# Patient Record
Sex: Female | Born: 1935 | Race: Black or African American | Hispanic: No | Smoking: Never smoker
Health system: Southern US, Community
[De-identification: ages and names within clinical notes are randomized; demographics above are authoritative.]

## PROBLEM LIST (undated history)

## (undated) DIAGNOSIS — D649 Anemia, unspecified: Secondary | ICD-10-CM

## (undated) DIAGNOSIS — R928 Other abnormal and inconclusive findings on diagnostic imaging of breast: Secondary | ICD-10-CM

## (undated) DIAGNOSIS — G629 Polyneuropathy, unspecified: Secondary | ICD-10-CM

## (undated) DIAGNOSIS — N959 Unspecified menopausal and perimenopausal disorder: Secondary | ICD-10-CM

## (undated) DIAGNOSIS — R1310 Dysphagia, unspecified: Secondary | ICD-10-CM

## (undated) DIAGNOSIS — M792 Neuralgia and neuritis, unspecified: Secondary | ICD-10-CM

## (undated) DIAGNOSIS — N760 Acute vaginitis: Secondary | ICD-10-CM

## (undated) DIAGNOSIS — R300 Dysuria: Secondary | ICD-10-CM

## (undated) DIAGNOSIS — Z1231 Encounter for screening mammogram for malignant neoplasm of breast: Secondary | ICD-10-CM

## (undated) DIAGNOSIS — M542 Cervicalgia: Secondary | ICD-10-CM

## (undated) DIAGNOSIS — R9431 Abnormal electrocardiogram [ECG] [EKG]: Secondary | ICD-10-CM

## (undated) DIAGNOSIS — G8929 Other chronic pain: Secondary | ICD-10-CM

## (undated) DIAGNOSIS — N390 Urinary tract infection, site not specified: Secondary | ICD-10-CM

## (undated) DIAGNOSIS — I8390 Asymptomatic varicose veins of unspecified lower extremity: Secondary | ICD-10-CM

## (undated) DIAGNOSIS — Z853 Personal history of malignant neoplasm of breast: Secondary | ICD-10-CM

## (undated) DIAGNOSIS — Z1239 Encounter for other screening for malignant neoplasm of breast: Secondary | ICD-10-CM

## (undated) DIAGNOSIS — C50412 Malignant neoplasm of upper-outer quadrant of left female breast: Secondary | ICD-10-CM

## (undated) DIAGNOSIS — N6321 Unspecified lump in the left breast, upper outer quadrant: Secondary | ICD-10-CM

## (undated) DIAGNOSIS — K219 Gastro-esophageal reflux disease without esophagitis: Secondary | ICD-10-CM

## (undated) DIAGNOSIS — N6320 Unspecified lump in the left breast, unspecified quadrant: Secondary | ICD-10-CM

## (undated) DIAGNOSIS — B9689 Other specified bacterial agents as the cause of diseases classified elsewhere: Secondary | ICD-10-CM

## (undated) DIAGNOSIS — R634 Abnormal weight loss: Secondary | ICD-10-CM

## (undated) DIAGNOSIS — M5410 Radiculopathy, site unspecified: Secondary | ICD-10-CM

## (undated) DIAGNOSIS — E875 Hyperkalemia: Secondary | ICD-10-CM

## (undated) DIAGNOSIS — N63 Unspecified lump in unspecified breast: Secondary | ICD-10-CM

## (undated) DIAGNOSIS — M810 Age-related osteoporosis without current pathological fracture: Secondary | ICD-10-CM

## (undated) DIAGNOSIS — F0280 Dementia in other diseases classified elsewhere without behavioral disturbance: Secondary | ICD-10-CM

## (undated) DIAGNOSIS — G301 Alzheimer's disease with late onset: Secondary | ICD-10-CM

## (undated) DIAGNOSIS — G609 Hereditary and idiopathic neuropathy, unspecified: Secondary | ICD-10-CM

## (undated) DIAGNOSIS — R519 Headache, unspecified: Secondary | ICD-10-CM

## (undated) DIAGNOSIS — J301 Allergic rhinitis due to pollen: Secondary | ICD-10-CM

## (undated) DIAGNOSIS — M4727 Other spondylosis with radiculopathy, lumbosacral region: Secondary | ICD-10-CM

## (undated) DIAGNOSIS — G2581 Restless legs syndrome: Principal | ICD-10-CM

## (undated) DIAGNOSIS — R062 Wheezing: Principal | ICD-10-CM

## (undated) DIAGNOSIS — I10 Essential (primary) hypertension: Secondary | ICD-10-CM

## (undated) DIAGNOSIS — Z171 Estrogen receptor negative status [ER-]: Secondary | ICD-10-CM

## (undated) DIAGNOSIS — F01A Vascular dementia, mild, without behavioral disturbance, psychotic disturbance, mood disturbance, and anxiety (HCC): Principal | ICD-10-CM

## (undated) DIAGNOSIS — M4722 Other spondylosis with radiculopathy, cervical region: Principal | ICD-10-CM

## (undated) DIAGNOSIS — R413 Other amnesia: Secondary | ICD-10-CM

## (undated) DIAGNOSIS — Z0000 Encounter for general adult medical examination without abnormal findings: Secondary | ICD-10-CM

## (undated) DIAGNOSIS — J9601 Acute respiratory failure with hypoxia: Principal | ICD-10-CM

## (undated) DIAGNOSIS — E7800 Pure hypercholesterolemia, unspecified: Secondary | ICD-10-CM

## (undated) DIAGNOSIS — I7090 Unspecified atherosclerosis: Secondary | ICD-10-CM

## (undated) DIAGNOSIS — M1990 Unspecified osteoarthritis, unspecified site: Secondary | ICD-10-CM

## (undated) DIAGNOSIS — I208 Other forms of angina pectoris: Secondary | ICD-10-CM

---

## 2007-08-20 LAB — VITAMIN D, 1, 25 DIHYDROXY: Vitamin D,1,25-dihydroxy: 52 pg/mL (ref 15–75)

## 2007-10-17 LAB — T4 (THYROXINE): T4, Total: 6.3 ug/dL (ref 5–12)

## 2007-10-17 LAB — T3 TOTAL: T3, total: 131 NG/DL (ref 80–181)

## 2007-10-17 LAB — PROLACTIN: Prolactin: 40.3 NG/ML — ABNORMAL HIGH (ref 1–25)

## 2007-10-17 LAB — TSH 3RD GENERATION: TSH: 0.34 u[IU]/mL — ABNORMAL LOW (ref 0.35–5.5)

## 2007-10-22 LAB — PTH INTACT
Calcium: 9.8 MG/DL (ref 8.5–10.1)
PTH, Intact: 42.3 pg/mL (ref 14–72)

## 2007-11-18 LAB — METABOLIC PANEL, COMPREHENSIVE
A-G Ratio: 1 — ABNORMAL LOW (ref 1.1–2.2)
ALT (SGPT): 34 U/L (ref 30–65)
AST (SGOT): 19 U/L (ref 15–37)
Albumin: 3.6 g/dL (ref 3.5–5.0)
Alk. phosphatase: 78 U/L (ref 50–136)
Anion gap: 6 mmol/L (ref 5–15)
BUN/Creatinine ratio: 9 — ABNORMAL LOW (ref 12–20)
BUN: 10 MG/DL (ref 6–20)
Bilirubin, total: 0.3 MG/DL (ref <1.0)
CO2: 29 MMOL/L (ref 21–32)
Calcium: 10 MG/DL (ref 8.5–10.1)
Chloride: 103 MMOL/L (ref 97–108)
Creatinine: 1.1 MG/DL (ref 0.6–1.3)
GFR est AA: >60 mL/min/{1.73_m2} (ref >60)
GFR est non-AA: 51 mL/min/{1.73_m2} — ABNORMAL LOW (ref >60)
Globulin: 3.7 g/dL (ref 2.0–4.0)
Glucose: 83 MG/DL (ref 65–105)
Potassium: 3.6 MMOL/L (ref 3.5–5.1)
Protein, total: 7.3 g/dL (ref 6.4–8.2)
Sodium: 138 MMOL/L (ref 136–145)

## 2007-11-18 LAB — LIPASE: Lipase: 194 U/L (ref 114–286)

## 2007-11-18 LAB — AMYLASE: Amylase: 129 U/L — ABNORMAL HIGH (ref 25–115)

## 2007-11-21 LAB — CREATININE, POC
Creatinine (POC): 1.2 MG/DL (ref 0.6–1.3)
GFRAA, POC: 52 mL/min/{1.73_m2} — ABNORMAL LOW (ref >60)
GFRNA, POC: 43 mL/min/{1.73_m2} — ABNORMAL LOW (ref >60)

## 2008-02-09 NOTE — Op Note (Signed)
Name: Kimberly [name], Kimberly [name]  MR #: [id number] Surgeon: Levonne Spiller,   M.D.  Account #: [id number] Surgery Date:  DOB: June 11, 1924  Age: 74 Location:     OPERATIVE REPORT      PREOPERATIVE DIAGNOSIS: An 74 year old female with dysplastic anemia.    POSTOPERATIVE DIAGNOSES:  1. Hiatal hernia, 3 cm.  2. Mild gastritis.  3. Smooth duodenum, rule out celiac disease.    INDICATIONS: An 74 year old female presenting with dysphagia, heartburn,  history of anemia. History and Physical was done prior to examination.    ANESTHESIA RISK: 3.    AIRWAY ASSESSMENT: 2.    MEDICATIONS: Fentanyl and Versed. See my notes for dosages.    MONITORING: Conscious anesthesia was monitored including O2 saturation,  blood pressure, airway, and EKG monitoring, all remained stable during the  examination.    ANESTHESIA PRE-CHECK AND PLAN: No change. Time-out occurred.    CONSENT: Complications of the procedure were discussed including  perforation and need for surgery.    DESCRIPTION OF PROCEDURE: With the patient in the left lateral position,  the Olympus video endoscope was passed difficulty from the posterior  pharynx to the patient's esophagus. Posterior pharynx was unremarkable.  There was no evidence of any esophageal obstruction noted. There was a  hiatal hernia of 3 cm. No significant esophagitis was noted. The  instrument was passed into the stomach. There was diffuse mild gastritis  noted with AVM noted x1. The instrument was passed through the pylorus  into the duodenum. Duodenum was unremarkable in the bulb and second  portion appeared slightly smooth. In the second and third portion of the  duodenum, biopsies were taken to exclude sprue. The instrument was  withdrawn back into the stomach and retroflexed. No additional findings in  the fundus or cardia, biopsy was taken for Helicobacter pylori testing.  Instrument was withdrawn. The patient tolerated the entire procedure  well.     PLAN: Await results of the biopsy report. Will proceed with colonoscopy  examination after discussing with Dr. Rogelia Boga.            E-Signed By  Levonne Spiller, M.D. 02/16/2008 10:24  Levonne Spiller, M.D.    cc: Levonne Spiller, M.D.        HJS/FI2; D: 02/09/2008 10:28 A; T: 02/09/2008 2:29 P; Doc# [id number]; Job#  [id number]

## 2008-02-09 NOTE — Op Note (Signed)
Op Notes signed by  at 02/16/08 1024                  Author: Charolette Forward, MD  Service: --  Author Type: Physician            Filed:   Date of Service: 02/09/08 1429  Status: Signed          <!--EPICS--> Name:      Kimberly [name], Kimberly [name]<BR> MR #:      [id number]                    Surgeon:        Levonne Spiller, <BR> M.D.<BR> Account #: [id number]                  Surgery Date:<BR> DOB:       07-08-1924<BR> Age:       74                           Location:<BR> <BR>                              OPERATIVE REPORT<BR> <BR> <BR> PREOPERATIVE DIAGNOSIS:  An 74 year old female with dysplastic anemia.<BR>  <BR> POSTOPERATIVE DIAGNOSES:<BR> 1.     Hiatal hernia, 3 cm.<BR> 2.     Mild gastritis.<BR> 3.     Smooth duodenum, rule out celiac disease.<BR> <BR> INDICATIONS: An 74 year old female presenting with dysphagia, heartburn,<BR> history of anemia.  History  and Physical was done prior to examination.<BR> <BR> ANESTHESIA RISK:  3.<BR> <BR> AIRWAY ASSESSMENT:  2.<BR> <BR> MEDICATIONS:   Fentanyl and Versed.  See my notes for dosages.<BR> <BR> MONITORING:  Conscious anesthesia was monitored including O2 saturation,<BR>  blood pressure, airway, and EKG monitoring, all remained stable during the<BR> examination.<BR> <BR> ANESTHESIA PRE-CHECK AND PLAN:  No change.  Time-out occurred.<BR> <BR> CONSENT:  Complications of the procedure were discussed including<BR> perforation  and need for surgery.<BR> <BR> DESCRIPTION OF PROCEDURE:  With the patient in the left lateral position,<BR> the Olympus video endoscope was passed difficulty from the posterior<BR> pharynx to the patient's esophagus.  Posterior pharynx was unremarkable.<BR>  There was no evidence of any esophageal obstruction noted.  There was a<BR> hiatal hernia of 3 cm.  No significant esophagitis was noted.  The<BR> instrument was passed into the stomach.  There was diffuse mild gastritis<BR> noted with AVM noted x1.   The instrument was passed through the  pylorus<BR> into the duodenum.  Duodenum was unremarkable in the bulb and second<BR> portion appeared slightly smooth.  In the second and third portion of the<BR> duodenum, biopsies were taken to exclude sprue.  The  instrument was<BR> withdrawn back into the stomach and retroflexed.  No additional findings in<BR> the fundus or cardia, biopsy was taken for Helicobacter pylori testing.<BR> Instrument was withdrawn.  The patient tolerated the entire procedure<BR> well.<BR>  <BR> PLAN:  Await results of the biopsy report.  Will proceed with colonoscopy<BR> examination after discussing with Dr. Rogelia Boga.<BR> <BR> <BR> <BR> <BR> <BR> E-Signed By<BR> Levonne Spiller, M.D. 02/16/2008 10:24<BR> Levonne Spiller, M.D.<BR>  <BR> cc:   Levonne Spiller, M.D.<BR> <BR> <BR> <BR> HJS/FI2; D: 02/09/2008 10:28 A; T: 02/09/2008  2:29 P; Doc# [id number]; Job#<BR> [id number]<BM> <!--EPICE-->

## 2009-04-12 LAB — CREATININE, POC
Creatinine (POC): 0.9 MG/DL (ref 0.6–1.3)
GFRAA, POC: >60 mL/min/{1.73_m2} (ref >60)
GFRNA, POC: >60 mL/min/{1.73_m2} (ref >60)

## 2009-04-12 MED ADMIN — ioversol (OPTIRAY) 350 mg/mL contrast solution 100 mL: INTRAVENOUS | @ 16:00:00 | NDC 00019133311

## 2009-04-12 MED ADMIN — sodium chloride (NS) flush 10 mL: INTRAVENOUS | @ 16:00:00 | NDC 58016499501

## 2009-04-12 MED ADMIN — 0.9% sodium chloride infusion: INTRAVENOUS | @ 16:00:00 | NDC 00409798309

## 2009-04-12 MED FILL — SALINE FLUSH INJECTION SYRINGE: INTRAMUSCULAR | Qty: 10

## 2009-04-12 MED FILL — SODIUM CHLORIDE 0.9 % IV: INTRAVENOUS | Qty: 1000

## 2009-04-12 MED FILL — OPTIRAY 350 MG IODINE/ML INTRAVENOUS SOLUTION: 350 mg iodine/mL | INTRAVENOUS | Qty: 100

## 2009-07-20 LAB — CREATININE, POC
Creatinine (POC): 0.8 MG/DL (ref 0.6–1.3)
GFRAA, POC: >60 mL/min/{1.73_m2} (ref >60)
GFRNA, POC: >60 mL/min/{1.73_m2} (ref >60)

## 2009-07-20 MED ADMIN — ioversol (OPTIRAY) 320 mg/mL contrast injection 80 mL: INTRAVENOUS | @ 19:00:00 | NDC 00019132311

## 2009-07-20 MED ADMIN — sodium chloride (NS) flush 10 mL: INTRAVENOUS | @ 19:00:00 | NDC 58016499501

## 2009-07-20 MED ADMIN — 0.9% sodium chloride infusion: INTRAVENOUS | @ 19:00:00 | NDC 00409798348

## 2009-07-20 MED FILL — SODIUM CHLORIDE 0.9 % IV: INTRAVENOUS | Qty: 1000

## 2009-07-20 MED FILL — OPTIRAY 320 MG IODINE/ML INTRAVENOUS SOLUTION: 320 mg iodine/mL | INTRAVENOUS | Qty: 100

## 2009-07-20 MED FILL — SALINE FLUSH INJECTION SYRINGE: INTRAMUSCULAR | Qty: 10

## 2010-05-12 LAB — CREATININE, POC
Creatinine (POC): 0.8 MG/DL (ref 0.6–1.3)
GFRAA, POC: >60 mL/min/{1.73_m2} (ref >60)
GFRNA, POC: >60 mL/min/{1.73_m2} (ref >60)

## 2010-05-12 MED ORDER — BARIUM SULFATE 2 % ORAL SUSP
2.1 % (w/v), 2.0 % (w/w) | Freq: Once | ORAL | Status: AC
Start: 2010-05-12 — End: 2010-05-12
  Administered 2010-05-12: 13:00:00 via ORAL

## 2010-05-12 MED ORDER — SODIUM CHLORIDE 0.9 % IJ SYRG
Freq: Once | INTRAMUSCULAR | Status: AC
Start: 2010-05-12 — End: 2010-05-12
  Administered 2010-05-12: 13:00:00 via INTRAVENOUS

## 2010-05-12 MED ORDER — IOVERSOL 350 MG/ML IV SOLN
350 mg iodine/mL | Freq: Once | INTRAVENOUS | Status: AC
Start: 2010-05-12 — End: 2010-05-12
  Administered 2010-05-12: 13:00:00 via INTRAVENOUS

## 2010-05-12 MED ADMIN — 0.9% sodium chloride infusion: INTRAVENOUS | @ 13:00:00 | NDC 00409798309

## 2010-06-25 MED ORDER — OXYCODONE-ACETAMINOPHEN 5 MG-325 MG TAB
5-325 mg | ORAL | Status: AC
Start: 2010-06-25 — End: 2010-06-25
  Administered 2010-06-25: 20:00:00 via ORAL

## 2010-06-25 MED ORDER — OXYCODONE-ACETAMINOPHEN 5 MG-325 MG TAB
5-325 mg | ORAL_TABLET | ORAL | Status: DC | PRN
Start: 2010-06-25 — End: 2011-06-14

## 2010-06-25 MED FILL — OXYCODONE-ACETAMINOPHEN 5 MG-325 MG TAB: 5-325 mg | ORAL | Qty: 1

## 2010-06-25 NOTE — ED Provider Notes (Signed)
Patient is a 76 y.o. female presenting with knee pain.   Knee Pain        86 year with significant hx of arthritis presents via wheelchair to [facility] ED with cc of pain behind R knee (8/10 intensity) x yesterday with no recent fall, injury, or trauma. Pt states pain worsens with movement. Pt denies any CP, SOB, ABD pain, NVD, bladder/bowel problems, f/c, cough, rhinorrhea, sore throat, or rash.    Pt states that she does not smoke cigarettes.    There are no other new complaints, changes or physical findings at this time.  Written by Jeronimo Greaves, ED Scribe; dictated by Deloria Lair, MD    Past Medical History   Diagnosis Date   ??? Hypertension    ??? High cholesterol    ??? Arthritis    ??? Anginal pain           Past Surgical History   Procedure Date   ??? Hx hysterectomy            No family history on file.     History   Social History   ??? Marital Status: Widowed     Spouse Name: N/A     Number of Children: N/A   ??? Years of Education: N/A   Occupational History   ??? Not on file.   Social History Main Topics   ??? Smoking status: Never Smoker    ??? Smokeless tobacco: Not on file   ??? Alcohol Use: No   ??? Drug Use:    ??? Sexually Active:    Other Topics Concern   ??? Not on file   Social History Narrative   ??? No narrative on file                    ALLERGIES: Pcn and Sulfa (sulfonamide antibiotics)      Review of Systems   Constitutional: Negative.  Negative for fever and chills.   HENT: Negative.  Negative for sore throat and rhinorrhea.    Eyes: Negative.    Respiratory: Negative.  Negative for cough and shortness of breath.    Cardiovascular: Negative.  Negative for chest pain.   Gastrointestinal: Negative.  Negative for nausea, vomiting, abdominal pain, diarrhea, constipation and blood in stool.   Genitourinary: Negative.  Negative for dysuria, urgency, frequency, hematuria, enuresis and difficulty urinating.   Musculoskeletal: Positive for arthralgias.   Skin: Negative.  Negative for rash.   Neurological: Negative.     All other systems reviewed and are negative.        Filed Vitals:    06/25/10 1432   BP: 150/84   Pulse: 84   Temp: 98.1 ??F (36.7 ??C)   Resp: 16   Weight: 128 lb 1.4 oz (58.1 kg)   SpO2: 97%              Physical Exam   Nursing note and vitals reviewed.     Physical Examination: General appearance - Well appearing female in no apparent distress  Eyes - pupils equal and reactive, extraocular eye movements intact  Mouth - mucous membranes moist, pharynx normal without lesions  Neck - supple, no significant adenopathy  Chest - Normal respiratory effort, clear to auscultation bilaterally  Heart - normal rate and regular rhythm, S1 and S2 normal, no murmurs noted  Abdomen - soft, nontender, nondistended, no peritoneal signs  Neurological - alert, oriented, normal speech, cranial nerves intact, no focal motor findings  Extremities - peripheral pulses  normal, no pedal edema   Tenderness to posterior R knee, NROM, no deformity  Skin - normal coloration and turgor, no rash    Written by Jeronimo Greaves, ED Scribe; dictated by Deloria Lair, MD    MDM    Procedures    3:12 PM  Carolyn Stare has been re-evaluated. Careplan outlined and precautions discussed. Pt's results and d/c instructions have been reviewed with good understanding. Pt's questions were answered and there are no new complaints or findings. Pt agrees to follow up as advised and return to the ED upon further deterioration. Pt is ready to go home.  Written by Jeronimo Greaves, ED Scribe; dictated by Deloria Lair, MD

## 2011-06-14 MED ORDER — HYDROCODONE-ACETAMINOPHEN 2.5 MG-500 MG TAB
ORAL_TABLET | Freq: Four times a day (QID) | ORAL | Status: DC | PRN
Start: 2011-06-14 — End: 2013-05-10

## 2011-06-14 NOTE — ED Provider Notes (Signed)
HPI Comments: Kimberly [name] is a 77 y.o. female who presents ambulatory to the ED with CC of gradual onset of moderate L bicep pain and bruising that is worse with movement, but does not decrease ROM since 2 days ago while pulling clothes out of a closet. The pt states she takes aspirin daily, has a hx of bleeding easily, but denies having any underlying blood disorders. The patient specifically denies arm trauma, fever, chills, diaphoresis, appetite loss, cold sxs, cough, SOB, CP, palpitations, leg swelling, ABD pain, nausea, vomiting, bowel sxs, urinary sxs, rash, HA, lightheadedness, dizziness, weakness, and numbness.    PCP: Aundria Rud, MD  Allergies: PCN, Sulfa  Significant PMHx: HTN, High Cholesterol, Arthritis, Angina  Significant PSHx: Hysterectomy  Social Hx: - tobacco, - alcohol, - illicit drugs    There are no other complaints, changes or physical findings at this time.   Written by Morley Kos, ED Scribe, as dictated by Barnie Del, MD    The history is provided by the patient. No language interpreter was used.        Past Medical History   Diagnosis Date   ??? Hypertension    ??? High cholesterol    ??? Arthritis    ??? Anginal pain         Past Surgical History   Procedure Date   ??? Hx hysterectomy          No family history on file.     History     Social History   ??? Marital Status: Widowed     Spouse Name: N/A     Number of Children: N/A   ??? Years of Education: N/A     Occupational History   ??? Not on file.     Social History Main Topics   ??? Smoking status: Never Smoker    ??? Smokeless tobacco: Not on file   ??? Alcohol Use: No   ??? Drug Use:    ??? Sexually Active:      Other Topics Concern   ??? Not on file     Social History Narrative   ??? No narrative on file                  ALLERGIES: Pcn and Sulfa(sulfonamide antibiotics)      Review of Systems   Constitutional: Negative.  Negative for fever, chills, diaphoresis and appetite change.    HENT: Negative.  Negative for congestion, sore throat, rhinorrhea, sneezing, postnasal drip and sinus pressure.    Eyes: Negative.    Respiratory: Negative.  Negative for cough and shortness of breath.    Cardiovascular: Negative.  Negative for chest pain, palpitations and leg swelling.   Gastrointestinal: Negative.  Negative for nausea, vomiting, abdominal pain, diarrhea, constipation and blood in stool.   Genitourinary: Negative.  Negative for dysuria, urgency, frequency, hematuria, decreased urine volume and difficulty urinating.   Musculoskeletal: Positive for myalgias (gradual onset of moderate L bicep pain and bruising that is worse with movement, but does not decrease ROM since 2 days ago while pulling clothes out of a closet). Negative for back pain.   Skin: Negative.  Negative for rash.   Neurological: Negative.  Negative for dizziness, weakness, light-headedness, numbness and headaches.   Hematological: Negative.    Psychiatric/Behavioral: Negative.    All other systems reviewed and are negative.    Written by Morley Kos, ED Scribe, as dictated by Barnie Del, MD  Filed Vitals:    06/14/11 1113   BP: 155/78   Pulse: 52   Temp: 98.1 ??F (36.7 ??C)   Resp: 16   Height: 5\' 2"  (1.575 [name])   Weight: 56.6 kg (124 lb 12.5 oz)   SpO2: 98%            Physical Exam   Nursing note and vitals reviewed.  Constitutional: She is oriented to person, place, and time. She appears well-developed and well-nourished. No distress.        Elderly female   HENT:   Head: Normocephalic and atraumatic.   Eyes: Conjunctivae and EOM are normal. Pupils are equal, round, and reactive to light.   Neck: Normal range of motion. Neck supple. No JVD present.   Cardiovascular: Normal rate, regular rhythm and intact distal pulses.    Pulmonary/Chest: Effort normal and breath sounds normal. No respiratory distress.   Abdominal: Soft. Bowel sounds are normal. There is no tenderness.    Musculoskeletal: Normal range of motion. She exhibits edema. She exhibits no tenderness.        Left upper arm: She exhibits tenderness, swelling and edema.        Arms:  Neurological: She is alert and oriented to person, place, and time. She has normal strength. No cranial nerve deficit or sensory deficit. She exhibits normal muscle tone.        Equal grip strength   Skin: Skin is warm and dry. No rash noted.   Psychiatric: She has a normal mood and affect. Her behavior is normal.        MDM     Differential Diagnosis; Clinical Impression; Plan:     Sprain, strain, contusion, muscle tear, DVT    Bruising left biceps, no DVT, pain reproducible, will sling and refer to PCP/ortho   Amount and/or Complexity of Data Reviewed:   Tests in the radiology section of CPT??:  Ordered and reviewed   Decide to obtain previous medical records or to obtain history from someone other than the patient:  Yes   Review and summarize past medical records:  Yes   Independant visualization of image, tracing, or specimen:  Yes  Progress:   Patient progress:  Improved and stable      Procedures    1:52 PM    I have reviewed all pertinent and currently available diagnostic test results for this visit including, but not limited to, labs, xrays, and EKGs. I have reviewed all pertinent and currently available medical records. My plan of care and further evaluation and/or disposition is based on these results, as well as the initial, and subsequent, history and physical exam, as well as any additional complaints during the visit.    Toney Rakes, MD       MEDICATIONS GIVEN:    No current facility-administered medications for this encounter.     Current Outpatient Prescriptions   Medication Sig   ??? omeprazole (PRILOSEC) 40 mg capsule Take 40 mg by mouth daily.     ??? simvastatin (ZOCOR) 10 mg tablet Take 10 mg by mouth nightly.     ??? ergocalciferol, vitamin d2, 50,000 unit Tab Take 50,000 Units by mouth every seven (7) days.      ??? alendronate-vitamin d3 70-2,800 mg-unit per tablet Take 1 Tab by mouth every seven (7) days.     ??? PHOSPHATIDYL SERINE, BULK, 700 mg by Does Not Apply route daily.     ??? HYDROcodone-acetaminophen (LORTAB) 2.5-500 mg per tablet Take 1 Tab  by mouth every six (6) hours as needed for Pain.   ??? ALENDRONATE SODIUM (ALENDRONATE PO) Take 70 mg by mouth. 1 tablet weekly on an empty stomach    ??? ascorbic acid (VITAMIN C) 1,000 mg tablet Take  by mouth.   ??? cilostazol (PLETAL) 100 mg tablet Take  by mouth two (2) times a day. On empty stomach   ??? hydrochlorothiazide (MICROZIDE) 12.5 mg capsule Take 12.5 mg by mouth daily.   ??? isosorbide dinitrate (ISORDIL) 10 mg tablet Take  by mouth two (2) times a day.   ??? VITAMIN E MIXED (NATURAL VITAMIN E PO) Take 400 Units by mouth. 3 x week    ??? OXYBUTYNIN CHLORIDE PO Take  by mouth two (2) times a day.   ??? verapamil 240 mg CR tablet Take  by mouth daily.   ??? vitamin A 8,000 unit capsule Take 8,000 Units by mouth daily.   ??? MULTIVITAMIN PO Take  by mouth.   ??? OMEGA-3 FATTY ACIDS (OMEGA 3 PO) Take 400 mg by mouth. q day        LAB RESULTS:      RADIOLOGY RESULTS:    Doppler neg for DVT    IMPRESSION:    1. Biceps muscle strain    2. Arm bruise    3. Arm injury        PLAN:    1. Pain meds  2. sling  3. Ortho/PCP f/u

## 2011-06-14 NOTE — Progress Notes (Signed)
[facility] Lab Technologist Preliminary Report:  Venous Duplex Arm    Left arm venous duplex was performed.    All deep and superficial veins appear compressible with normal Doppler characteristics.    Final report to follow.

## 2011-06-14 NOTE — ED Notes (Signed)
Pt given discharge instructions by Dr. Fannon. Pt verbalized understanding. Pt ambulated out of the ER.

## 2011-06-14 NOTE — ED Notes (Signed)
Assumed care of pt. Pt in position of comfort, call bell within reach. Pt reports on Tuesday that she reached into a closet and now her left shoulder and arm are causing her pain. She has a swollen, discolored area on her left bicep. She denies any SOB or chest pain.

## 2011-06-15 NOTE — Procedures (Signed)
[facility]  *** FINAL REPORT ***    Name: Kimberly [name], Kimberly [name]  MRN: [id number]  DOB: 11 Oct 1923  Visit: [id number]  Date: 14 Jun 2011    TYPE OF TEST: Peripheral Venous Testing    REASON FOR TEST  Pain in limb, Limb swelling    Left Arm:-  Deep venous thrombosis:           No  Superficial venous thrombosis:    No      INTERPRETATION/FINDINGS  Left arm :  1. Deep vein(s) visualized include the internal jugular, subclavian,  axillary, brachial, radial and ulnar veins.  2. No evidence of deep venous thrombosis detected in the veins  visualized.  3. No evidence of deep vein thrombosis in the contralateral subclavian   vein.  4. Superficial vein(s) visualized include the basilic (upper arm),  basilic (forearm), cephalic (upper arm) and cephalic (forearm) veins.  5. No evidence of superficial thrombosis detected.    ADDITIONAL COMMENTS    I have personally reviewed the data relevant to the interpretation of  this  study.    TECHNOLOGIST:  David Charbonneau, RDMS    PHYSICIAN:  Electronically signed by:  Tyanne Derocher, MD  06/15/2011 03:40 PM

## 2011-06-15 NOTE — Procedures (Signed)
[facility]  *** FINAL REPORT ***    Name: Kimberly [name], Kimberly [name]  MRN: [id number]  DOB: July 14, 1924  Visit: [id number]  Date: 14 Jun 2011    TYPE OF TEST: Peripheral Venous Testing    REASON FOR TEST  Pain in limb, Limb swelling    Left Arm:-  Deep venous thrombosis:           No  Superficial venous thrombosis:    No      INTERPRETATION/FINDINGS  Left arm :  1. Deep vein(s) visualized include the internal jugular, subclavian,  axillary, brachial, radial and ulnar veins.  2. No evidence of deep venous thrombosis detected in the veins  visualized.  3. No evidence of deep vein thrombosis in the contralateral subclavian   vein.  4. Superficial vein(s) visualized include the basilic (upper arm),  basilic (forearm), cephalic (upper arm) and cephalic (forearm) veins.  5. No evidence of superficial thrombosis detected.    ADDITIONAL COMMENTS    I have personally reviewed the data relevant to the interpretation of  this  study.    TECHNOLOGIST:  Marijo Sanes, RDMS    PHYSICIAN:  Electronically signed by:  Azzie Roup, MD  06/15/2011 03:40 PM

## 2011-09-12 ENCOUNTER — Encounter

## 2011-10-18 LAB — CBC WITH AUTOMATED DIFF
ABS. BASOPHILS: 0.1 10*3/uL (ref 0.0–0.1)
ABS. EOSINOPHILS: 0.1 10*3/uL (ref 0.0–0.4)
ABS. LYMPHOCYTES: 0.9 10*3/uL (ref 0.8–3.5)
ABS. MONOCYTES: 0.6 10*3/uL (ref 0.0–1.0)
ABS. NEUTROPHILS: 3.5 10*3/uL (ref 1.8–8.0)
BASOPHILS: 1 % (ref 0–1)
EOSINOPHILS: 2 % (ref 0–7)
HCT: 34.1 % — ABNORMAL LOW (ref 35.0–47.0)
HGB: 11.4 g/dL — ABNORMAL LOW (ref 11.5–16.0)
LYMPHOCYTES: 17 % (ref 12–49)
MCH: 23.7 PG — ABNORMAL LOW (ref 26.0–34.0)
MCHC: 33.4 g/dL (ref 30.0–36.5)
MCV: 70.9 FL — ABNORMAL LOW (ref 80.0–99.0)
MONOCYTES: 12 % (ref 5–13)
NEUTROPHILS: 68 % (ref 32–75)
PLATELET: 266 10*3/uL (ref 150–400)
RBC: 4.81 M/uL (ref 3.80–5.20)
RDW: 15 % — ABNORMAL HIGH (ref 11.5–14.5)
WBC: 5.2 10*3/uL (ref 3.6–11.0)

## 2011-10-18 LAB — METABOLIC PANEL, COMPREHENSIVE
A-G Ratio: 0.9 — ABNORMAL LOW (ref 1.1–2.2)
ALT (SGPT): 22 U/L (ref 12–78)
AST (SGOT): 14 U/L — ABNORMAL LOW (ref 15–37)
Albumin: 3.3 g/dL — ABNORMAL LOW (ref 3.5–5.0)
Alk. phosphatase: 79 U/L (ref 50–136)
Anion gap: 8 mmol/L (ref 5–15)
BUN/Creatinine ratio: 13 (ref 12–20)
BUN: 9 MG/DL (ref 6–20)
Bilirubin, total: 0.4 MG/DL (ref 0.2–1.0)
CO2: 29 MMOL/L (ref 21–32)
Calcium: 9.1 MG/DL (ref 8.5–10.1)
Chloride: 100 MMOL/L (ref 97–108)
Creatinine: 0.67 MG/DL (ref 0.45–1.15)
GFR est AA: >60 mL/min/{1.73_m2} (ref >60)
GFR est non-AA: >60 mL/min/{1.73_m2} (ref >60)
Globulin: 3.8 g/dL (ref 2.0–4.0)
Glucose: 120 MG/DL — ABNORMAL HIGH (ref 65–100)
Potassium: 3.8 MMOL/L (ref 3.5–5.1)
Protein, total: 7.1 g/dL (ref 6.4–8.2)
Sodium: 137 MMOL/L (ref 136–145)

## 2011-10-18 LAB — EKG, 12 LEAD, INITIAL
Atrial Rate: 74 {beats}/min
Calculated P Axis: 60 degrees
Calculated R Axis: 49 degrees
Calculated T Axis: 81 degrees
Diagnosis: NORMAL
P-R Interval: 132 ms
Q-T Interval: 412 ms
QRS Duration: 98 ms
QTC Calculation (Bezet): 457 ms
Ventricular Rate: 74 {beats}/min

## 2011-10-18 LAB — POC TROPONIN-I: Troponin-I (POC): <0.04 ng/mL (ref 0.00–0.08)

## 2011-10-18 MED ORDER — PANTOPRAZOLE 40 MG IV SOLR
40 mg | INTRAVENOUS | Status: AC
Start: 2011-10-18 — End: 2011-10-18
  Administered 2011-10-18: 19:00:00 via INTRAVENOUS

## 2011-10-18 MED ORDER — OMEPRAZOLE 40 MG CAP, DELAYED RELEASE
40 mg | ORAL_CAPSULE | Freq: Every day | ORAL | Status: DC
Start: 2011-10-18 — End: 2017-08-20

## 2011-10-18 MED ORDER — ALUM-MAG HYDROXIDE-SIMETH 200 MG-200 MG-20 MG/5 ML ORAL SUSP
200-200-20 mg/5 mL | ORAL | Status: AC
Start: 2011-10-18 — End: 2011-10-18
  Administered 2011-10-18: 19:00:00 via ORAL

## 2011-10-18 MED FILL — PROTONIX 40 MG INTRAVENOUS SOLUTION: 40 mg | INTRAVENOUS | Qty: 40

## 2011-10-18 MED FILL — PHENOBARB-HYOSCYAMN-ATROPINE-SCOP 16.2 MG-0.1037 MG/5 ML (5 ML) ELIXIR: 16.2 mg-0.1037 mg/5 mL (5 mL) | ORAL | Qty: 10

## 2011-10-18 NOTE — ED Notes (Signed)
Discharge instructions were given to patient and explained by Dr. McGee. Patient left the ED ambulatory with a steady gait with discharge instructions in hand.

## 2011-10-18 NOTE — ED Provider Notes (Signed)
HPI Comments: Kimberly [name] is a 78 y.o. female who presents ambulatory to [facility] ED with cc of constant, 5/10, epigastric pain with associated Sxs of nausea and belching x last night. Pt reports that she has not been around any other ill individuals recently. Pt specifically denies CP, SOB, diaphoresis, F/C, vomiting, diarrhea, cough, congestion, sore throat, neck pain, back pain, or problems with urine or bowel function.    PCP: Aundria Rud, MD    PMhx is significant for: HTN  Surgical hx is significant for: none  Family PMhx is significant for: not asked   Social hx: - Smoke, - EtOH, - Drugs    There are no other complaints, changes or physical findings at this time.  Written by Melchor Amour, ED Scribe, as dictated by Deloria Lair, MD.      The history is provided by the patient.        Past Medical History   Diagnosis Date   ??? Hypertension    ??? High cholesterol    ??? Arthritis    ??? Anginal pain         Past Surgical History   Procedure Date   ??? Hx hysterectomy    ??? Hx orthopaedic      epidural pain mtg         No family history on file.     History     Social History   ??? Marital Status: WIDOWED     Spouse Name: N/A     Number of Children: N/A   ??? Years of Education: N/A     Occupational History   ??? Not on file.     Social History Main Topics   ??? Smoking status: Never Smoker    ??? Smokeless tobacco: Not on file   ??? Alcohol Use: No   ??? Drug Use:    ??? Sexually Active:      Other Topics Concern   ??? Not on file     Social History Narrative   ??? No narrative on file                  ALLERGIES: Pcn and Sulfa (sulfonamide antibiotics)      Review of Systems   Constitutional: Negative.  Negative for fever, chills and diaphoresis.   HENT: Negative.  Negative for congestion, sore throat and neck pain.    Eyes: Negative.  Negative for visual disturbance.   Respiratory: Negative.  Negative for cough and shortness of breath.    Cardiovascular: Negative.  Negative for chest pain.   Gastrointestinal: Positive for nausea and  abdominal pain (epigastric). Negative for vomiting, diarrhea, constipation and blood in stool.        Belching   Genitourinary: Negative.  Negative for dysuria, urgency, frequency, hematuria, enuresis and difficulty urinating.   Musculoskeletal: Negative.  Negative for back pain.   Skin: Negative.    Neurological: Negative.  Negative for dizziness, light-headedness and headaches.   All other systems reviewed and are negative.        Filed Vitals:    10/18/11 1243 10/18/11 1309 10/18/11 1533   BP: 166/86 132/70 144/69   Pulse: 89 75 74   Temp: 97.6 ??F (36.4 ??C)     Resp: 16 26 16    Height: 5\' 2"  (1.575 m)     Weight: 54.885 kg (121 lb)     SpO2: 100% 95% 93%            Physical Exam  Physical Examination: General appearance - Well appearing elderly female in no apparent distress  Eyes - pupils equal and reactive, extraocular eye movements intact  Mouth - mucous membranes moist, pharynx normal without lesions  Neck - supple, no significant adenopathy  Chest - Normal respiratory effort, clear to auscultation bilaterally  Heart - normal rate and regular rhythm, S1 and S2 normal, no murmurs noted  Abdomen - soft, mild epigastric tenderness, nondistended, no peritoneal signs  Neurological - alert, oriented, normal speech, cranial nerves intact, no focal motor findings  Extremities - peripheral pulses normal, no pedal edema  Skin - normal coloration and turgor, no rash  Written by Melchor Amour, ED Scribe, as dictated by Deloria Lair, MD.    MDM     Differential Diagnosis; Clinical Impression; Plan:     DDx: Gastritis, Viral Syndrome, GERD    Basic Labs - CBC & CMP    Reassess    Amount and/or Complexity of Data Reviewed:   Clinical lab tests:  Reviewed and ordered  Progress:   Patient progress:  Stable      Procedures    LABORATORY TESTS:  Recent Results (from the past 12 hour(s))   CBC WITH AUTOMATED DIFF    Collection Time    10/18/11  1:02 PM       Component Value Range    WBC 5.2  3.6 - 11.0 K/uL    RBC 4.81  3.80 -  5.20 M/uL    HGB 11.4 (*) 11.5 - 16.0 g/dL    HCT 28.4 (*) 13.2 - 47.0 %    MCV 70.9 (*) 80.0 - 99.0 FL    MCH 23.7 (*) 26.0 - 34.0 PG    MCHC 33.4  30.0 - 36.5 g/dL    RDW 44.0 (*) 10.2 - 14.5 %    PLATELET 266  150 - 400 K/uL    NEUTROPHILS 68  32 - 75 %    LYMPHOCYTES 17  12 - 49 %    MONOCYTES 12  5 - 13 %    EOSINOPHILS 2  0 - 7 %    BASOPHILS 1  0 - 1 %    ABS. NEUTROPHILS 3.5  1.8 - 8.0 K/UL    ABS. LYMPHOCYTES 0.9  0.8 - 3.5 K/UL    ABS. MONOCYTES 0.6  0.0 - 1.0 K/UL    ABS. EOSINOPHILS 0.1  0.0 - 0.4 K/UL    ABS. BASOPHILS 0.1  0.0 - 0.1 K/UL    RBC COMMENTS        Value: 1+ HYPOCHROMIA      1+ TARGET CELLS      1+ OVALOCYTES    DF SMEAR SCANNED     METABOLIC PANEL, COMPREHENSIVE    Collection Time    10/18/11  1:02 PM       Component Value Range    Sodium 137  136 - 145 MMOL/L    Potassium 3.8  3.5 - 5.1 MMOL/L    Chloride 100  97 - 108 MMOL/L    CO2 29  21 - 32 MMOL/L    Anion gap 8  5 - 15 mmol/L    Glucose 120 (*) 65 - 100 MG/DL    BUN 9  6 - 20 MG/DL    Creatinine 7.25  3.66 - 1.15 MG/DL    BUN/Creatinine ratio 13  12 - 20      GFR est AA >60  >60 ml/min/1.44m2    GFR est non-AA >60  >  60 ml/min/1.29m2    Calcium 9.1  8.5 - 10.1 MG/DL    Bilirubin, total 0.4  0.2 - 1.0 MG/DL    ALT 22  12 - 78 U/L    AST 14 (*) 15 - 37 U/L    Alk. phosphatase 79  50 - 136 U/L    Protein, total 7.1  6.4 - 8.2 g/dL    Albumin 3.3 (*) 3.5 - 5.0 g/dL    Globulin 3.8  2.0 - 4.0 g/dL    A-G Ratio 0.9 (*) 1.1 - 2.2     POC TROPONIN-I    Collection Time    10/18/11  2:37 PM       Component Value Range    Troponin-I (POC) <0.04  0.00 - 0.08 ng/mL       IMAGING RESULTS:         XR CHEST PA LAT (Final result)   Result time:10/18/11 1422      Final result by Rad Results In Edi (10/18/11 14:22:00)      Narrative:    **Final Report**      ICD Codes / Adm.Diagnosis: 130865 738.4 / Chest Pain   Examination: CR CHEST PA AND LATERAL - [id number] - Oct 18 2011 2:15PM  Accession No: [id number]  Reason: cough      REPORT:  INDICATION: Cough.     COMPARISON: 04/11/2010.    FINDINGS: PA and lateral views were obtained of the chest. The heart is   normal in size. The aorta is atherosclerotic. The lungs are clear. No   consolidation, pulmonary edema, or pleural effusion is evident.   Degenerative change of the spine and shoulders is noted.      IMPRESSION: No evidence of pneumonia.                          Signing/Reading Doctor: Trilby Leaver [phone])   Approved: Trilby Leaver [phone]) Oct 18 2011 2:25PM        MEDICATIONS GIVEN:    Medications   gabapentin (NEURONTIN) 100 mg capsule (not administered)   traMADol (ULTRAM) 50 mg tablet (not administered)   omeprazole (PRILOSEC) 40 mg capsule (not administered)   pantoprazole (PROTONIX) injection 40 mg (40 mg IntraVENous Given 10/18/11 1436)   mylanta/donnatal/viscous lidocaine (GI COCKTAIL) (50 mL Oral Given 10/18/11 1435)       IMPRESSION:  1. GERD (gastroesophageal reflux disease)        PLAN:  1. Prilosec  2. F/U with Aundria Rud, MD  Return to ED if worse     Discharge Note:  3:43 PM  Shar Paez Catalano's  results have been reviewed with her.  She has been counseled regarding her diagnosis.  She verbally conveys understanding and agreement of the signs, symptoms, diagnosis, treatment and prognosis and additionally agrees to follow up as recommended.  She also agrees with the care-plan and conveys that all of her questions have been answered.  I have also put together some discharge instructions for her that include: 1) educational information regarding their diagnosis, 2) how to care for their diagnosis at home, as well a 3) list of reasons why they would want to return to the ED prior to their follow-up appointment, should their condition change.

## 2011-10-18 NOTE — ED Notes (Signed)
Patient arrives through triage c/o epigastric pain and increased belching. Patient is resting in bed, bed in low position, call bell in reach, monitor x3.

## 2011-10-18 NOTE — ED Notes (Signed)
Patient states that she feels much better after GI cocktail

## 2012-01-08 NOTE — ED Notes (Signed)
Larey Seat one week ago and landed on the sidewalk injuring her right knee. It seems to have gotten worse instead of better and has pain and edema just below the knee cap.

## 2012-01-09 MED ORDER — TRAMADOL 50 MG TAB
50 mg | ORAL_TABLET | Freq: Four times a day (QID) | ORAL | Status: AC | PRN
Start: 2012-01-09 — End: 2012-01-14

## 2012-01-09 MED ADMIN — lidocaine-EPINEPHrine (XYLOCAINE) 1 %-1:100,000 injection 50 mg: SUBCUTANEOUS | @ 07:00:00 | NDC 00409317801

## 2012-01-09 MED ADMIN — traMADol (ULTRAM) tablet 50 mg: ORAL | @ 07:00:00 | NDC 62584055911

## 2012-01-09 MED FILL — TRAMADOL 50 MG TAB: 50 mg | ORAL | Qty: 1

## 2012-01-09 MED FILL — LIDOCAINE-EPINEPHRINE 1 %-1:100,000 IJ SOLN: 1 %-:00,000 | INTRAMUSCULAR | Qty: 20

## 2012-01-09 NOTE — ED Notes (Signed)
Dr Meade Maw discussed discharge instructions with the pt. The pt verbalized understanding and will be taken home by her niece.

## 2012-01-09 NOTE — ED Provider Notes (Signed)
HPI Comments: 78 y.o.female who presents to the ED secondary to right knee pain (6/10).  Patient reports tripping on a curb and falling onto right knee approximately 10 days ago.  Patient complains of worsening pain over the past few days with increased swelling and redness just below right knee.  Patient reports pain is worse with ambulation.  Patient denies any open wounds to knee/leg after fall.  Patient denies fever, chills, head injury.  No complaints of chest pain, shortness of breath, cough, congestion, abdominal pain, n/v/d, urinary symptoms, dizziness, headache, syncope, or any other acute medical concerns at this time.  PMHx:  Hypertension, high cholesterol, arthritis.  No history of skin abscesses.    Note written by Leotis Shames A. Alben Spittle, Scribe, as dictated by Levada Schilling, MD 1:04 AM          The history is provided by the patient.        Past Medical History   Diagnosis Date   ??? Hypertension    ??? High cholesterol    ??? Arthritis    ??? Anginal pain         Past Surgical History   Procedure Date   ??? Hx hysterectomy    ??? Hx orthopaedic      epidural pain mtg         No family history on file.     History     Social History   ??? Marital Status: WIDOWED     Spouse Name: N/A     Number of Children: N/A   ??? Years of Education: N/A     Occupational History   ??? Not on file.     Social History Main Topics   ??? Smoking status: Never Smoker    ??? Smokeless tobacco: Not on file   ??? Alcohol Use: No   ??? Drug Use:    ??? Sexually Active:      Other Topics Concern   ??? Not on file     Social History Narrative   ??? No narrative on file                  ALLERGIES: Pcn and Sulfa (sulfonamide antibiotics)      Review of Systems   Constitutional: Negative for fever and chills.   HENT: Negative for congestion.    Eyes: Negative for visual disturbance.   Respiratory: Negative for shortness of breath.    Cardiovascular: Negative for chest pain.   Gastrointestinal: Negative for nausea, vomiting, abdominal pain and diarrhea.    Genitourinary: Negative for dysuria, hematuria and difficulty urinating.   Musculoskeletal: Positive for arthralgias (right knee pain).   Skin: Positive for color change (erythema just below right knee). Negative for wound.   Neurological: Negative for dizziness, syncope, speech difficulty, light-headedness and headaches.   Hematological: Negative.    Psychiatric/Behavioral: Negative for confusion.   All other systems reviewed and are negative.        Filed Vitals:    01/08/12 2308 01/09/12 0028   BP: 160/88    Temp: 98 ??F (36.7 ??C)    Resp: 20    Height: 5\' 2"  (1.575 m)    Weight: 55.339 kg (122 lb)    SpO2: 99% 98%            Physical Exam   Nursing note and vitals reviewed.  Constitutional: She is oriented to person, place, and time. She appears well-developed and well-nourished. No distress.   HENT:   Head: Normocephalic  and atraumatic.   Mouth/Throat: Oropharynx is clear and moist.   Eyes: Conjunctivae are normal. Pupils are equal, round, and reactive to light.   Neck: Normal range of motion. Neck supple. No JVD present. No tracheal deviation present.   Cardiovascular: Normal rate, regular rhythm, normal heart sounds and intact distal pulses.  Exam reveals no gallop and no friction rub.    No murmur heard.  Pulmonary/Chest: Effort normal and breath sounds normal. No stridor. No respiratory distress. She has no wheezes. She has no rales. She exhibits no tenderness.   Abdominal: Soft. She exhibits no distension and no mass. There is no tenderness. There is no rebound and no guarding.   Musculoskeletal: Normal range of motion. She exhibits no edema.        Legs:  Neurological: She is alert and oriented to person, place, and time.   Skin: Skin is warm and dry. No rash noted. She is not diaphoretic. No pallor.   Psychiatric: She has a normal mood and affect. Her behavior is normal.        MDM    I&D Abcess Complex  Consent: Verbal consent obtained.  Consent given by: patient  Performed by: attendingPreparation:  skin prepped with Betadine  Pre-procedure re-eval: Immediately prior to the procedure, the patient was reevaluated and found suitable for the planned procedure and any planned medications.  Time out: Immediately prior to the procedure a time out was called to verify the correct patient, procedure, equipment, staff and marking as appropriate..  Location details: right leg  Anesthesia: local infiltration  Local anesthetic: lidocaine 1% with epinephrine  Anesthetic total: 3 ml  Scalpel size: 11  Incision type: single straight  Complexity: simple  Drainage: bloody (old blood consistent with hematoma.  NO PURULENCE NOTED)  Wound treatment: expression of material  Packing material: 1/4 in gauze  Post-procedure: dressing applied  Patient tolerance: Patient tolerated the procedure well with no immediate complications.  My total time at bedside, performing this procedure was 1-15 minutes.        2:34 AM  Area below knee is a hematoma, no abscess.  Note written by Leotis Shames A. Alben Spittle, Scribe, as dictated by Levada Schilling, MD 2:35 AM    A/P:  1.  Traumatic hematoma - s/p I&D in ED. No evidence of infection.  Packing removal 2 days.  Signs of infection discussed.    Patient's results have been reviewed with them.  Patient and/or family have verbally conveyed their understanding and agreement of the patient's signs, symptoms, diagnosis, treatment and prognosis and additionally agree to follow up as recommended or return to the Emergency Room should their condition change prior to follow-up.  Discharge instructions have also been provided to the patient with some educational information regarding their diagnosis as well a list of reasons why they would want to return to the ER prior to their follow-up appointment should their condition change.

## 2012-04-22 ENCOUNTER — Encounter

## 2012-04-30 ENCOUNTER — Encounter

## 2012-07-17 ENCOUNTER — Encounter

## 2012-07-28 LAB — CREATININE, POC
Creatinine (POC): 0.9 MG/DL (ref 0.6–1.3)
GFRAA, POC: >60 mL/min/{1.73_m2} (ref >60)
GFRNA, POC: 59 mL/min/{1.73_m2} — ABNORMAL LOW (ref >60)

## 2012-07-28 MED ADMIN — sodium chloride (NS) flush 10 mL: INTRAVENOUS | @ 17:00:00 | NDC 87701099893

## 2012-07-28 MED ADMIN — ioversol (OPTIRAY) 350 mg iodine/mL contrast solution 100 mL: INTRAVENOUS | @ 17:00:00 | NDC 00019133311

## 2012-07-28 MED FILL — OPTIRAY 350 MG IODINE/ML INTRAVENOUS SOLUTION: 350 mg iodine/mL | INTRAVENOUS | Qty: 100

## 2012-07-28 MED FILL — SALINE FLUSH INJECTION SYRINGE: INTRAMUSCULAR | Qty: 10

## 2012-08-31 LAB — POC CHEM8
Anion gap (POC): 12 mmol/L (ref 5–15)
BUN (POC): 12 MG/DL (ref 9–20)
CO2 (POC): 27 MMOL/L (ref 21–32)
Calcium, ionized (POC): 1.22 MMOL/L (ref 1.12–1.32)
Chloride (POC): 104 MMOL/L (ref 98–107)
Creatinine (POC): 0.7 MG/DL (ref 0.6–1.3)
GFRAA, POC: >60 mL/min/{1.73_m2} (ref >60)
GFRNA, POC: >60 mL/min/{1.73_m2} (ref >60)
Glucose (POC): 80 MG/DL (ref 65–105)
Hematocrit (POC): 36 % (ref 35.0–47.0)
Hemoglobin (POC): 12.2 GM/DL (ref 11.5–16.0)
Potassium (POC): 4.5 MMOL/L (ref 3.5–5.1)
Sodium (POC): 138 MMOL/L (ref 136–145)

## 2012-08-31 LAB — CBC WITH AUTOMATED DIFF
ABS. BASOPHILS: 0 10*3/uL (ref 0.0–0.1)
ABS. EOSINOPHILS: 0.1 10*3/uL (ref 0.0–0.4)
ABS. LYMPHOCYTES: 1 10*3/uL (ref 0.8–3.5)
ABS. MONOCYTES: 0.4 10*3/uL (ref 0.0–1.0)
ABS. NEUTROPHILS: 3.1 10*3/uL (ref 1.8–8.0)
BASOPHILS: 1 % (ref 0–1)
EOSINOPHILS: 2 % (ref 0–7)
HCT: 35.2 % (ref 35.0–47.0)
HGB: 11.3 g/dL — ABNORMAL LOW (ref 11.5–16.0)
LYMPHOCYTES: 22 % (ref 12–49)
MCH: 22.9 PG — ABNORMAL LOW (ref 26.0–34.0)
MCHC: 32.1 g/dL (ref 30.0–36.5)
MCV: 71.3 FL — ABNORMAL LOW (ref 80.0–99.0)
MONOCYTES: 9 % (ref 5–13)
NEUTROPHILS: 66 % (ref 32–75)
PLATELET: 256 10*3/uL (ref 150–400)
RBC: 4.94 M/uL (ref 3.80–5.20)
RDW: 15 % — ABNORMAL HIGH (ref 11.5–14.5)
WBC: 4.6 10*3/uL (ref 3.6–11.0)

## 2012-08-31 LAB — METABOLIC PANEL, COMPREHENSIVE
A-G Ratio: 0.9 — ABNORMAL LOW (ref 1.1–2.2)
ALT (SGPT): 24 U/L (ref 12–78)
AST (SGOT): 15 U/L (ref 15–37)
Albumin: 3.4 g/dL — ABNORMAL LOW (ref 3.5–5.0)
Alk. phosphatase: 83 U/L (ref 50–136)
Anion gap: 9 mmol/L (ref 5–15)
BUN/Creatinine ratio: 23 — ABNORMAL HIGH (ref 12–20)
BUN: 12 MG/DL (ref 6–20)
Bilirubin, total: 0.3 MG/DL (ref 0.2–1.0)
CO2: 25 MMOL/L (ref 21–32)
Calcium: 9 MG/DL (ref 8.5–10.1)
Chloride: 105 MMOL/L (ref 97–108)
Creatinine: 0.52 MG/DL (ref 0.45–1.15)
GFR est AA: >60 mL/min/{1.73_m2} (ref >60)
GFR est non-AA: >60 mL/min/{1.73_m2} (ref >60)
Globulin: 3.7 g/dL (ref 2.0–4.0)
Glucose: 80 MG/DL (ref 65–100)
Potassium: 4.4 MMOL/L (ref 3.5–5.1)
Protein, total: 7.1 g/dL (ref 6.4–8.2)
Sodium: 139 MMOL/L (ref 136–145)

## 2012-08-31 LAB — URINALYSIS W/ REFLEX CULTURE
Bacteria: NEGATIVE /HPF
Bilirubin: NEGATIVE
Blood: NEGATIVE
Glucose: NEGATIVE MG/DL
Ketone: NEGATIVE MG/DL
Leukocyte Esterase: NEGATIVE
Nitrites: NEGATIVE
Protein: NEGATIVE MG/DL
Specific gravity: 1.009 (ref 1.003–1.030)
Urobilinogen: 0.2 EU/DL (ref 0.2–1.0)
pH (UA): 7 (ref 5.0–8.0)

## 2012-08-31 LAB — CK W/ CKMB & INDEX
CK - MB: 1.6 NG/ML (ref 0.5–3.6)
CK-MB Index: 2.1 (ref 0–2.5)
CK: 76 U/L (ref 26–192)

## 2012-08-31 LAB — TROPONIN I: Troponin-I, Qt.: <0.04 ng/mL (ref <0.05)

## 2012-08-31 MED ADMIN — mylanta/donnatal/viscous lidocaine (GI COCKTAIL): ORAL | @ 22:00:00 | NDC 99990238820

## 2012-08-31 MED FILL — PHENOBARB-HYOSCYAMN-ATROPINE-SCOP 16.2 MG-0.1037 MG/5 ML (5 ML) ELIXIR: 16.2 mg-0.1037 mg/5 mL (5 mL) | ORAL | Qty: 10

## 2012-08-31 NOTE — ED Notes (Signed)
Assume care, vss, no ectopy on monitor.  In low fowlers position with famialy at bedside. Will repeat cardiac enzymes at 2100.  Pt aware.

## 2012-08-31 NOTE — ED Notes (Signed)
Tolerates meal well, lab at bedside collecting repeat cardiac enzymes.

## 2012-08-31 NOTE — ED Notes (Signed)
Pt stating that the GI Cocktail has helped with her gastric reflux.

## 2012-08-31 NOTE — ED Notes (Signed)
Pt up and ambulating to the bathroom independently with a steady gait.

## 2012-08-31 NOTE — ED Notes (Signed)
Bedside and Verbal shift change report given to Rick,RN (oncoming nurse) by A.Fleming-Hoyt,RN (offgoing nurse).  Report given with SBAR, ED Summary, Intake/Output, MAR and Recent Results.

## 2012-08-31 NOTE — ED Provider Notes (Signed)
HPI Comments: Kimberly [name] 79 y.o. presents to ED via EMS with CC of epigastric pain x last night. Pt reports acute onset of deep constant epigastric pain x 2000 last night until 0000. Pt reports hx significant for GERD but indicates current sxs are different because pain is radiating through to her back creating a "squeezing" sensation across her back and radiating to L chest. Pt denies hx of burning with GERD sxs and states she usually gets the hiccups and the same epigastric pain but non-radiating. Pt reports drinking a glass of hot water and taking Zantac and indicates sxs improved and she went to bed x 0000. Pt states she woke up with the same pain x 0200 and took another Zantac and went back to sleep until 0630 this morning feeling fine. Pt indicates she drank a Boost x 0800 and went to church and started having epigastric pain again x 1100 after eating a sausage biscuit, peaches, orange juice, and tea. Pt states pain is worse with eating but denies hx of ulcer. Pt reports she has intermittent constipation at baseline and takes stool softeners. Pt indicates she had a NL BM this morning. Pt states she took her omeprazole this morning as rx'd and has been compliant. Pt denies pain in ED.  Pt denies F/C, congestion, cough, blood in stool, or vomiting.    PCP: Aundria Rud, MD     PMHx: Significant for HTN, hypercholesterolemia, arthritis, angina  PSHx: Significant for hysterectomy  PSx: - tobacco, - EtOH    There are no other complaints, changes or physical findings at this time.   Written by Sidney Ace, ED Scribe, as dictated by Marquette Old, MD.      The history is provided by the patient, the EMS personnel and a relative.        Past Medical History   Diagnosis Date   ??? Hypertension    ??? High cholesterol    ??? Arthritis    ??? Anginal pain         Past Surgical History   Procedure Laterality Date   ??? Hx hysterectomy     ??? Hx orthopaedic       epidural pain mtg         History reviewed. No pertinent  family history.     History     Social History   ??? Marital Status: WIDOWED     Spouse Name: N/A     Number of Children: N/A   ??? Years of Education: N/A     Occupational History   ??? Not on file.     Social History Main Topics   ??? Smoking status: Never Smoker    ??? Smokeless tobacco: Not on file   ??? Alcohol Use: No   ??? Drug Use:    ??? Sexually Active:      Other Topics Concern   ??? Not on file     Social History Narrative   ??? No narrative on file                  ALLERGIES: Pcn and Sulfa (sulfonamide antibiotics)      Review of Systems   Constitutional: Negative.  Negative for fever and chills.   HENT: Negative.  Negative for congestion.    Eyes: Negative.    Respiratory: Negative.  Negative for cough.    Cardiovascular: Negative.    Gastrointestinal: Positive for abdominal pain (epigastric pain radiating to L chest and across back).  Negative for vomiting, diarrhea, constipation and blood in stool.   Genitourinary: Negative.    Musculoskeletal: Negative.    Skin: Negative.    Neurological: Negative.    All other systems reviewed and are negative.        Filed Vitals:    08/31/12 1517 08/31/12 1615 08/31/12 1700 08/31/12 1745   BP:  170/97 142/78 153/80   Pulse:  83 83 82   Temp:       Resp:  14 22 17    Height:       Weight:       SpO2: 99% 99% 96% 98%            Physical Exam   Nursing note and vitals reviewed.  Constitutional: She is oriented to person, place, and time. She appears well-developed and well-nourished. No distress.   HENT:   Head: Atraumatic.   Eyes: EOM are normal.   Cardiovascular: Normal rate, regular rhythm, normal heart sounds and intact distal pulses.  Exam reveals no gallop and no friction rub.    No murmur heard.  Pulmonary/Chest: Effort normal and breath sounds normal. No respiratory distress. She has no wheezes. She has no rales. She exhibits no tenderness.   Abdominal: Soft. Bowel sounds are normal. She exhibits no distension and no mass. There is no rebound.   Moderate focal epigastric pain.  Mild/moderate guarding.   Musculoskeletal: Normal range of motion. She exhibits no edema and no tenderness.   Neurological: She is oriented to person, place, and time.   Skin: Skin is warm.   Psychiatric: She has a normal mood and affect.    Written by Sidney Ace, ED Scribe, as dictated by Marquette Old, MD.       EKG:  NSR, nonspecific T wave flattening anterior leads, no ST elevation/depression, possibly ischemic EKG  MDM     Differential Diagnosis; Clinical Impression; Plan:     DDx: reflux, PUD, atypical CP, acute MI, PNA  Amount and/or Complexity of Data Reviewed:   Clinical lab tests:  Ordered and reviewed  Tests in the radiology section of CPT??:  Ordered and reviewed  Tests in the medicine section of the CPT??:  Ordered and reviewed   Obtain history from someone other than the patient:  Yes (EMS)   Independant visualization of image, tracing, or specimen:  Yes      Procedures    9:07 PM   Pt has been reexamined. Pt has no new complaints, changes or physical findings. Care plan outlined and precautions discussed. All available results were reviewed with pt. All medications were reviewed with pt. All of pt's questions and concerns were addressed. Pt agrees to F/U as instructed with PCP to discuss possibly changing rx'd reflux medications and agrees to return to ED upon further deterioration. Pt is ready to go home.  Written by Crist Infante, ED Scribe, as dictated by Marquette Old, MD.      LABORATORY TESTS:  Recent Results (from the past 12 hour(s))   EKG, 12 LEAD, INITIAL    Collection Time     08/31/12  3:14 PM       Result Value Range    Ventricular Rate 78      Atrial Rate 78      P-R Interval 142      QRS Duration 96      Q-T Interval 408      QTC Calculation (Bezet) 465      Calculated P Axis 42  Calculated R Axis 8      Calculated T Axis 83      Diagnosis        Value: Normal sinus rhythm      Possible Left ventricular hypertrophy      Nonspecific ST and T wave abnormality      When compared with  ECG of 18-Oct-2011 12:50,      No significant change was found      Confirmed by Roseanne Reno, MD, [facility] [phone]) on 08/31/2012 7:49:54 PM   METABOLIC PANEL, COMPREHENSIVE    Collection Time     08/31/12  3:25 PM       Result Value Range    Sodium 139  136 - 145 MMOL/L    Potassium 4.4  3.5 - 5.1 MMOL/L    Chloride 105  97 - 108 MMOL/L    CO2 25  21 - 32 MMOL/L    Anion gap 9  5 - 15 mmol/L    Glucose 80  65 - 100 MG/DL    BUN 12  6 - 20 MG/DL    Creatinine 9.52  8.41 - 1.15 MG/DL    BUN/Creatinine ratio 23 (*) 12 - 20      GFR est AA >60  >60 ml/min/1.78m2    GFR est non-AA >60  >60 ml/min/1.69m2    Calcium 9.0  8.5 - 10.1 MG/DL    Bilirubin, total 0.3  0.2 - 1.0 MG/DL    ALT 24  12 - 78 U/L    AST 15  15 - 37 U/L    Alk. phosphatase 83  50 - 136 U/L    Protein, total 7.1  6.4 - 8.2 g/dL    Albumin 3.4 (*) 3.5 - 5.0 g/dL    Globulin 3.7  2.0 - 4.0 g/dL    A-G Ratio 0.9 (*) 1.1 - 2.2     CBC WITH AUTOMATED DIFF    Collection Time     08/31/12  3:25 PM       Result Value Range    WBC 4.6  3.6 - 11.0 K/uL    RBC 4.94  3.80 - 5.20 M/uL    HGB 11.3 (*) 11.5 - 16.0 g/dL    HCT 32.4  40.1 - 02.7 %    MCV 71.3 (*) 80.0 - 99.0 FL    MCH 22.9 (*) 26.0 - 34.0 PG    MCHC 32.1  30.0 - 36.5 g/dL    RDW 25.3 (*) 66.4 - 14.5 %    PLATELET 256  150 - 400 K/uL    NEUTROPHILS 66  32 - 75 %    LYMPHOCYTES 22  12 - 49 %    MONOCYTES 9  5 - 13 %    EOSINOPHILS 2  0 - 7 %    BASOPHILS 1  0 - 1 %    ABS. NEUTROPHILS 3.1  1.8 - 8.0 K/UL    ABS. LYMPHOCYTES 1.0  0.8 - 3.5 K/UL    ABS. MONOCYTES 0.4  0.0 - 1.0 K/UL    ABS. EOSINOPHILS 0.1  0.0 - 0.4 K/UL    ABS. BASOPHILS 0.0  0.0 - 0.1 K/UL    RBC COMMENTS 1+ ANISOCYTOSIS      DF SMEAR SCANNED     TROPONIN I    Collection Time     08/31/12  3:25 PM       Result Value Range    Troponin-I, Qt. <0.04  <0.05 ng/mL  CK W/ CKMB & INDEX    Collection Time     08/31/12  3:25 PM       Result Value Range    CK - MB 1.6  0.5 - 3.6 NG/ML    CK-MB Index 2.1  0 - 2.5      CK 76  26 - 192 U/L   POC CHEM8     Collection Time     08/31/12  3:31 PM       Result Value Range    Calcium, ionized (POC) 1.22  1.12 - 1.32 MMOL/L    Sodium (POC) 138  136 - 145 MMOL/L    Potassium (POC) 4.5  3.5 - 5.1 MMOL/L    Chloride (POC) 104  98 - 107 MMOL/L    CO2 (POC) 27  21 - 32 MMOL/L    Anion gap (POC) 12  5 - 15 mmol/L    Glucose (POC) 80  65 - 105 MG/DL    BUN (POC) 12  9 - 20 MG/DL    Creatinine (POC) 0.7  0.6 - 1.3 MG/DL    GFR-AA (POC) >16  >10 ml/min/1.73m2    GFR, non-AA (POC) >60  >60 ml/min/1.57m2    Hemoglobin (POC) 12.2  11.5 - 16.0 GM/DL    Hematocrit (POC) 36  35.0 - 47.0 %    Comment Comment Not Indicated.     URINALYSIS W/ REFLEX CULTURE    Collection Time     08/31/12  3:40 PM       Result Value Range    Color YELLOW/STRAW      Appearance CLEAR  CLEAR    Specific gravity 1.009  1.003 - 1.030      pH 7.0  5.0 - 8.0      Protein NEGATIVE   NEGATIVE MG/DL    Glucose NEGATIVE   NEGATIVE MG/DL    Ketone NEGATIVE   NEGATIVE MG/DL    Bilirubin NEGATIVE   NEGATIVE    Blood NEGATIVE   NEGATIVE    Urobilinogen 0.2  0.2 - 1.0 EU/DL    Nitrites NEGATIVE   NEGATIVE    Leukocyte Esterase NEGATIVE   NEGATIVE    UA:UC IF INDICATED CULTURE NOT INDICATED BY UA RESULT  CULTURE NOT INDICATE    WBC 0-4  0 - 4 /HPF    RBC 0-5  0 - 5 /HPF    Epithelial cells FEW  FEW /LPF    Bacteria NEGATIVE   NEGATIVE /HPF    Hyaline Cast 0-2  0 - 2   TROPONIN I    Collection Time     08/31/12  8:25 PM       Result Value Range    Troponin-I, Qt. <0.04  <0.05 ng/mL   CK W/ REFLX CKMB    Collection Time     08/31/12  8:25 PM       Result Value Range    CK 73  26 - 192 U/L       IMAGING RESULTS:     XR CHEST PORT (Final result)  Result time: 08/31/12 16:51:40      Final result by Rad Results In Edi (08/31/12 16:51:40)      Narrative:    **Final Report**      ICD Codes / Adm.Diagnosis: 160172 285.9 / Epigastric Pain   Examination: CR CHEST PORT - [id number] - Aug 31 2012 4:46PM  Accession No: [id number]  Reason: Chest Pain      REPORT:  EXAM: CR CHEST PORT     INDICATION: Chest Pain    COMPARISON: October 18, 2011    FINDINGS: A portable AP radiograph of the chest was obtained at 1635 hours.   There is no pneumothorax or pleural effusion. Several ill-defined dense   nodular opacities in the right perihilar region are again shown which are of   nonspecific nature. There is no consolidation or pulmonary edema. The   cardiac and mediastinal contours and pulmonary vascularity are stable. The   bones and soft tissues are severely osteopenic.      IMPRESSION: No acute findings.            Signing/Reading Doctor: DAVID G. DISLER [phone])   Approved: DAVID G. DISLER [phone]) Aug 31 2012 4:49PM                MEDICATIONS GIVEN:  Medications   mylanta/donnatal/viscous lidocaine (GI COCKTAIL) (50 mL Oral Given 08/31/12 1722)       IMPRESSION:  1. GERD (gastroesophageal reflux disease)    2. Atypical chest pain        PLAN:  1. Continue omeprazol  2. F/u with Aundria Rud, MD and cardiology  Return to ED if worse

## 2012-08-31 NOTE — ED Notes (Signed)
POC Chem 8,complete.

## 2012-08-31 NOTE — ED Notes (Signed)
Repeat cardiac enzymes wnl.  Pt remains pain and symptom free.  Vss.  Will release to home asap.

## 2012-08-31 NOTE — ED Notes (Signed)
Pt found to be eating chips from a family member, pt given Ginger Ale and states that her reflux has improved.

## 2012-09-01 LAB — EKG, 12 LEAD, INITIAL
Atrial Rate: 78 {beats}/min
Calculated P Axis: 42 degrees
Calculated R Axis: 8 degrees
Calculated T Axis: 83 degrees
Diagnosis: NORMAL
P-R Interval: 142 ms
Q-T Interval: 408 ms
QRS Duration: 96 ms
QTC Calculation (Bezet): 465 ms
Ventricular Rate: 78 {beats}/min

## 2012-09-01 LAB — TROPONIN I: Troponin-I, Qt.: <0.04 ng/mL (ref <0.05)

## 2012-09-01 LAB — CK W/ REFLX CKMB: CK: 73 U/L (ref 26–192)

## 2012-09-03 ENCOUNTER — Encounter

## 2012-09-10 ENCOUNTER — Encounter

## 2012-09-17 LAB — STRESS TEST LEXISCAN
Duke treadmill score: 5456
Max. Diastolic BP: 66 mmHg
Max. Heart rate: 96 {beats}/min
Max. Systolic BP: 145 mmHg
Peak Ex METs: 1 METS

## 2012-09-17 MED ADMIN — regadenoson (LEXISCAN) injection 0.4 mg: INTRAVENOUS | @ 15:00:00 | NDC 00469650189

## 2012-09-17 MED FILL — LEXISCAN 0.4 MG/5 ML INTRAVENOUS SYRINGE: 0.4 mg/5 mL | INTRAVENOUS | Qty: 5

## 2012-09-17 MED FILL — SALINE FLUSH INJECTION SYRINGE: INTRAMUSCULAR | Qty: 10

## 2012-09-17 NOTE — Progress Notes (Signed)
Lexiscan stress test completed. Myoview images to follow.

## 2013-05-10 LAB — URINALYSIS W/ REFLEX CULTURE
Bacteria: NEGATIVE /hpf
Bilirubin: NEGATIVE
Blood: NEGATIVE
Glucose: NEGATIVE mg/dL
Ketone: NEGATIVE mg/dL
Leukocyte Esterase: NEGATIVE
Nitrites: NEGATIVE
Protein: NEGATIVE mg/dL
Specific gravity: 1.008 (ref 1.003–1.030)
Urobilinogen: 0.2 EU/dL (ref 0.2–1.0)
pH (UA): 7 (ref 5.0–8.0)

## 2013-05-10 LAB — CBC WITH AUTOMATED DIFF
ABS. BASOPHILS: 0 10*3/uL (ref 0.0–0.1)
ABS. EOSINOPHILS: 0.1 10*3/uL (ref 0.0–0.4)
ABS. LYMPHOCYTES: 0.8 10*3/uL (ref 0.8–3.5)
ABS. MONOCYTES: 0.5 10*3/uL (ref 0.0–1.0)
ABS. NEUTROPHILS: 2.1 10*3/uL (ref 1.8–8.0)
BASOPHILS: 1 % (ref 0–1)
EOSINOPHILS: 2 % (ref 0–7)
HCT: 36.4 % (ref 35.0–47.0)
HGB: 12.1 g/dL (ref 11.5–16.0)
LYMPHOCYTES: 22 % (ref 12–49)
MCH: 23.6 PG — ABNORMAL LOW (ref 26.0–34.0)
MCHC: 33.2 g/dL (ref 30.0–36.5)
MCV: 71.1 FL — ABNORMAL LOW (ref 80.0–99.0)
MONOCYTES: 14 % — ABNORMAL HIGH (ref 5–13)
NEUTROPHILS: 61 % (ref 32–75)
PLATELET: 253 10*3/uL (ref 150–400)
RBC: 5.12 M/uL (ref 3.80–5.20)
RDW: 14.1 % (ref 11.5–14.5)
WBC: 3.5 10*3/uL — ABNORMAL LOW (ref 3.6–11.0)

## 2013-05-10 LAB — EKG, 12 LEAD, INITIAL
Atrial Rate: 77 {beats}/min
Calculated P Axis: 55 degrees
Calculated R Axis: -6 degrees
Calculated T Axis: 94 degrees
Diagnosis: NORMAL
P-R Interval: 140 ms
Q-T Interval: 380 ms
QRS Duration: 108 ms
QTC Calculation (Bezet): 430 ms
Ventricular Rate: 77 {beats}/min

## 2013-05-10 LAB — METABOLIC PANEL, COMPREHENSIVE
A-G Ratio: 0.9 — ABNORMAL LOW (ref 1.1–2.2)
ALT (SGPT): 30 U/L (ref 12–78)
AST (SGOT): 21 U/L (ref 15–37)
Albumin: 3.5 g/dL (ref 3.5–5.0)
Alk. phosphatase: 86 U/L (ref 45–117)
Anion gap: 9 mmol/L (ref 5–15)
BUN/Creatinine ratio: 15 (ref 12–20)
BUN: 9 MG/DL (ref 6–20)
Bilirubin, total: 0.5 MG/DL (ref 0.2–1.0)
CO2: 24 mmol/L (ref 21–32)
Calcium: 9.1 MG/DL (ref 8.5–10.1)
Chloride: 101 mmol/L (ref 97–108)
Creatinine: 0.59 MG/DL (ref 0.45–1.15)
GFR est AA: >60 mL/min/{1.73_m2} (ref >60)
GFR est non-AA: >60 mL/min/{1.73_m2} (ref >60)
Globulin: 4 g/dL (ref 2.0–4.0)
Glucose: 90 mg/dL (ref 65–100)
Potassium: 3.4 mmol/L — ABNORMAL LOW (ref 3.5–5.1)
Protein, total: 7.5 g/dL (ref 6.4–8.2)
Sodium: 134 mmol/L — ABNORMAL LOW (ref 136–145)

## 2013-05-10 LAB — CK W/ REFLX CKMB: CK: 195 U/L — ABNORMAL HIGH (ref 26–192)

## 2013-05-10 LAB — LIPASE: Lipase: 138 U/L (ref 73–393)

## 2013-05-10 LAB — TROPONIN I: Troponin-I, Qt.: <0.04 ng/mL (ref <0.05)

## 2013-05-10 LAB — CK-MB,QUANT.
CK - MB: 1.7 NG/ML (ref 0.5–3.6)
CK-MB Index: 0.9 (ref 0–2.5)

## 2013-05-10 MED ADMIN — morphine injection 2 mg: INTRAVENOUS | @ 14:00:00 | NDC 00409189001

## 2013-05-10 MED ADMIN — sodium chloride (NS) flush 5-10 mL: INTRAVENOUS | @ 12:00:00 | NDC 87701099893

## 2013-05-10 MED ADMIN — ondansetron (ZOFRAN) injection 4 mg: INTRAVENOUS | @ 12:00:00 | NDC 00409475503

## 2013-05-10 MED ADMIN — sodium chloride (NS) flush 5-10 mL: INTRAVENOUS | @ 14:00:00 | NDC 87701099893

## 2013-05-10 MED ADMIN — morphine injection 2 mg: INTRAVENOUS | @ 13:00:00 | NDC 00409189001

## 2013-05-10 MED ADMIN — famotidine (PF) (PEPCID) injection 20 mg: INTRAVENOUS | @ 12:00:00 | NDC 63323073912

## 2013-05-10 NOTE — ED Provider Notes (Signed)
HPI Comments: Kimberly [name] is a 79 y.o. female who presents ambulatory to [facility] ED with cc of 5/10 constant lower sternum and epigastric pain since 2000. Pt reports pain has decreased this morning but is still present in the ED. Pt reports she went to sleep with sxs and was woken up by lower sternum pain at 0500. Pt reports she drank water and attempted to go back to sleep. Pt reports additional sxs of 8/10 L shoulder pain. Pt reports she has recently started taking Cipro for a UTI and believed sxs were related to the medication. Pt reports she has experienced pain in her jaw this morning but notes she is not experiencing jaw pain currently in ED. Pt reports pain in the of of BL LLE that began 1 week ago and notes she is not experiencing leg pain while in ED. Pt reports she traveled back from [place] on 9/20. Pt denies recent hospital admission. Pt reports hx of GERD. Pt notes after eating she coughs up clear phlegm.     Pt denies diaphoresis, SOB, nausea, F/C, leg swelling, constipation, N/V/D.      PCP: Aundria Rud, MD    PMhx is significant for: HTN, hypercholesterolemia, arthritis, anginal pain, GERD  PShx is significant for: hysterectomy  Social hx: - Smoke, - EtOH, - Illicit Drugs    There are no other complaints, changes or physical findings at this time. 7:49 AM   Written by Delman Cheadle, ED Scribe, as dictated by Neldon Newport, MD        The history is provided by the patient.        Past Medical History   Diagnosis Date   ??? Hypertension    ??? High cholesterol    ??? Arthritis    ??? Anginal pain         Past Surgical History   Procedure Laterality Date   ??? Hx hysterectomy     ??? Hx orthopaedic       epidural pain mtg         History reviewed. No pertinent family history.     History     Social History   ??? Marital Status: WIDOWED     Spouse Name: N/A     Number of Children: N/A   ??? Years of Education: N/A     Occupational History   ??? Not on file.     Social History Main Topics   ??? Smoking status:  Never Smoker    ??? Smokeless tobacco: Not on file   ??? Alcohol Use: No   ??? Drug Use:    ??? Sexually Active:      Other Topics Concern   ??? Not on file     Social History Narrative   ??? No narrative on file     ALLERGIES: Pcn and Sulfa (sulfonamide antibiotics)      Review of Systems   Constitutional: Negative for fever, chills and diaphoresis.   HENT:        Pain in jaw   Eyes: Negative.    Respiratory: Positive for cough. Negative for shortness of breath.    Cardiovascular: Positive for chest pain (lower sternum). Negative for leg swelling.   Gastrointestinal: Positive for abdominal pain (epigastric). Negative for nausea, vomiting, diarrhea and constipation.   Endocrine: Negative for heat intolerance.   Genitourinary: Negative for flank pain.   Musculoskeletal: Positive for myalgias (BL LLE) and back pain (upper).   Skin: Negative for rash.  Allergic/Immunologic: Negative for immunocompromised state.   Neurological: Negative for headaches.   Hematological: Does not bruise/bleed easily.   Psychiatric/Behavioral: Negative.    All other systems reviewed and are negative.        Filed Vitals:    05/10/13 0834 05/10/13 0839 05/10/13 0909 05/10/13 0956   BP: 187/75 190/63     Pulse: 61 60 66 70   Temp:       Resp: 16 19 22 20    Height:       Weight:       SpO2: 100% 100% 100% 100%            Physical Exam   Nursing note and vitals reviewed.  Constitutional: She is oriented to person, place, and time. She appears well-developed and well-nourished. She appears distressed (mild).   HENT:   Head: Normocephalic and atraumatic.   Eyes: EOM are normal.   Neck: Normal range of motion.   Cardiovascular: Normal rate, regular rhythm and normal heart sounds.    Pulmonary/Chest: Effort normal and breath sounds normal. No respiratory distress.   Abdominal: Soft. Bowel sounds are normal. She exhibits no mass. There is tenderness in the right upper quadrant, right lower quadrant and epigastric area.   Musculoskeletal: Normal range of  motion. She exhibits tenderness (upper mid back and lower sternal area). She exhibits no edema.   No edema in BL legs   Neurological: She is alert and oriented to person, place, and time. Coordination normal.   Skin: Skin is warm and dry.   Psychiatric: She has a normal mood and affect.   Written by, Delman Cheadle, ED Scribe, as dictated by Neldon Newport, MD     MDM     Differential Diagnosis; Clinical Impression; Plan:     DDx: atypical chest pain, reflux, gastritis, pancreatitis, musculoskeletal pain, UTI   Amount and/or Complexity of Data Reviewed:   Clinical lab tests:  Ordered and reviewed  Tests in the radiology section of CPT??:  Ordered and reviewed  Tests in the medicine section of the CPT??:  Ordered and reviewed   Review and summarize past medical records:  Yes   Independant visualization of image, tracing, or specimen:  Yes  Progress:   Patient progress:  Stable and improved      Procedures    7:39 AM  ED EKG interpretation:  Rhythm: normal sinus rhythm; and regular . Rate (approx.): 77; Axis: normal; P wave: normal; QRS interval: normal ; ST/T wave: non-specific changes; in  Lead: ; Other findings: unchanged from previous ekg. This EKG was interpreted by Neldon Newport, MD,ED Provider.    Progress Note  9:21 AM  Pt reports her CP has resolved after receiving morphine and pepcid. Pt reports she is still experiencing mild back pain. Pt's BP has improved.  Written by Delman Cheadle, ED Scribe, as dictated by Neldon Newport, MD.    LABORATORY TESTS:  Recent Results (from the past 12 hour(s))   EKG, 12 LEAD, INITIAL    Collection Time     05/10/13  7:38 AM       Result Value Range    Ventricular Rate 77      Atrial Rate 77      P-R Interval 140      QRS Duration 108      Q-T Interval 380      QTC Calculation (Bezet) 430      Calculated P Axis 55      Calculated R Axis -  6      Calculated T Axis 94      Diagnosis        Value: Normal sinus rhythm      Nonspecific T wave abnormality      When compared with  ECG of 31-Aug-2012 15:14,      No significant change was found      Confirmed by Frankey Poot [phone]) on 05/10/2013 8:45:10 AM   CBC WITH AUTOMATED DIFF    Collection Time     05/10/13  7:55 AM       Result Value Range    WBC 3.5 (*) 3.6 - 11.0 K/uL    RBC 5.12  3.80 - 5.20 M/uL    HGB 12.1  11.5 - 16.0 g/dL    HCT 98.1  19.1 - 47.8 %    MCV 71.1 (*) 80.0 - 99.0 FL    MCH 23.6 (*) 26.0 - 34.0 PG    MCHC 33.2  30.0 - 36.5 g/dL    RDW 29.5  62.1 - 30.8 %    PLATELET 253  150 - 400 K/uL    NEUTROPHILS 61  32 - 75 %    LYMPHOCYTES 22  12 - 49 %    MONOCYTES 14 (*) 5 - 13 %    EOSINOPHILS 2  0 - 7 %    BASOPHILS 1  0 - 1 %    ABS. NEUTROPHILS 2.1  1.8 - 8.0 K/UL    ABS. LYMPHOCYTES 0.8  0.8 - 3.5 K/UL    ABS. MONOCYTES 0.5  0.0 - 1.0 K/UL    ABS. EOSINOPHILS 0.1  0.0 - 0.4 K/UL    ABS. BASOPHILS 0.0  0.0 - 0.1 K/UL    RBC COMMENTS HYPOCHROMIA      RBC COMMENTS PRESENT      RBC COMMENTS MICROCYTOSIS      RBC COMMENTS PRESENT     LIPASE    Collection Time     05/10/13  7:55 AM       Result Value Range    Lipase 138  73 - 393 U/L   CK W/ REFLX CKMB    Collection Time     05/10/13  7:55 AM       Result Value Range    CK 195 (*) 26 - 192 U/L   METABOLIC PANEL, COMPREHENSIVE    Collection Time     05/10/13  7:55 AM       Result Value Range    Sodium 134 (*) 136 - 145 mmol/L    Potassium 3.4 (*) 3.5 - 5.1 mmol/L    Chloride 101  97 - 108 mmol/L    CO2 24  21 - 32 mmol/L    Anion gap 9  5 - 15 mmol/L    Glucose 90  65 - 100 mg/dL    BUN 9  6 - 20 MG/DL    Creatinine 6.57  8.46 - 1.15 MG/DL    BUN/Creatinine ratio 15  12 - 20      GFR est AA >60  >60 ml/min/1.53m2    GFR est non-AA >60  >60 ml/min/1.54m2    Calcium 9.1  8.5 - 10.1 MG/DL    Bilirubin, total 0.5  0.2 - 1.0 MG/DL    ALT 30  12 - 78 U/L    AST 21  15 - 37 U/L    Alk. phosphatase 86  45 - 117 U/L    Protein, total 7.5  6.4 - 8.2 g/dL    Albumin 3.5  3.5 - 5.0 g/dL    Globulin 4.0  2.0 - 4.0 g/dL    A-G Ratio 0.9 (*) 1.1 - 2.2     TROPONIN I    Collection Time      05/10/13  7:55 AM       Result Value Range    Troponin-I, Qt. <0.04  <0.05 ng/mL   CK-MB,QUANT.    Collection Time     05/10/13  7:55 AM       Result Value Range    CK - MB 1.7  0.5 - 3.6 NG/ML    CK-MB Index 0.9  0 - 2.5     URINALYSIS W/ REFLEX CULTURE    Collection Time     05/10/13  8:15 AM       Result Value Range    Color YELLOW/STRAW      Appearance CLEAR  CLEAR      Specific gravity 1.008  1.003 - 1.030      pH (UA) 7.0  5.0 - 8.0      Protein NEGATIVE   NEG mg/dL    Glucose NEGATIVE   NEG mg/dL    Ketone NEGATIVE   NEG mg/dL    Bilirubin NEGATIVE   NEG      Blood NEGATIVE   NEG      Urobilinogen 0.2  0.2 - 1.0 EU/dL    Nitrites NEGATIVE   NEG      Leukocyte Esterase NEGATIVE   NEG      UA:UC IF INDICATED CULTURE NOT INDICATED BY UA RESULT  CNI      WBC 0-4  0 - 4 /hpf    RBC 0-5  0 - 5 /hpf    Epithelial cells FEW  FEW /lpf    Bacteria NEGATIVE   NEG /hpf    Hyaline Cast 0-2  0 - 2       IMAGING RESULTS:  XR CHEST PORT (Final result)  Result time: 05/10/13 08:50:39      Final result by Rad Results In Edi (05/10/13 08:50:39)      Narrative:    **Final Report**      ICD Codes / Adm.Diagnosis: 161096 786.59 / Chest Pain Chest Pains  Examination: CR CHEST PORT - [id number] - May 10 2013 8:19AM  Accession No: [id number]  Reason: cp      REPORT:  INDICATION: Chest pain    COMPARISON: 08/31/2012    A single frontal view was obtained. The time of this study is 0810 hrs.   There is no acute finding in the lungs. Calcified nodular opacities are   again noted primarily on the right but some on the left. These have not   changed. Heart and mediastinal shadows are stable. Marked degenerative   changes of the right shoulder again noted.Marland Kitchen          IMPRESSION:  No acute finding or change          Signing/Reading Doctor: Marlou Starks [phone])   Approved: Marlou Starks [phone]) May 10 2013 8:48AM        MEDICATIONS GIVEN:  Medications   sodium chloride (NS) flush 5-10 mL (10 mL IntraVENous Given 05/10/13 0818)   sodium  chloride (NS) flush 5-10 mL (10 mL IntraVENous Given 05/10/13 0952)   famotidine (PF) (PEPCID) injection 20 mg (20 mg IntraVENous Given 05/10/13 0822)   morphine injection 2 mg (2 mg IntraVENous Given 05/10/13 0837)  ondansetron [facility]) injection 4 mg (4 mg IntraVENous Given 05/10/13 0817)   morphine injection 2 mg (2 mg IntraVENous Given 05/10/13 0952)       IMPRESSION:  1. Chest pain    2. Abdominal pain, epigastric    3. GERD (gastroesophageal reflux disease)        PLAN:  1. Rx: Lortab, Zofran ODT  2. F/u with your PCP Aundria Rud, MD in 2 days if not improved  Return to ED if worse     DISCHARGE NOTE  10:31 AM    Mahogany Torrance is ready for discharge. The pt's signs/symptoms, results, diagnosis and discharge instructions have been discussed w/ the pt and/or available family members. The pt conveys understanding and agreement with the diagnosis and plan. There are no new physical findings, changes or complaints at this time. Pt is recommended to f/u with Aundria Rud, MD or return to ED if condition worsens.   Written by Delman Cheadle, ED Scribe, as dictated by Neldon Newport, MD.

## 2013-05-10 NOTE — ED Notes (Signed)
Patient and/or family and guardian identified and read over and explained discharge instructions with time for questions. Patient has verbalized understanding of discharge instructions. Signature pad in room not functioning at time of discharge.   Helped to get dressed. Go to bathroom and wheelchair discharge with son

## 2013-05-10 NOTE — ED Notes (Signed)
Pt states since the morphine everything is "quieted down even more"

## 2013-07-28 NOTE — ED Notes (Signed)
Discharge instructions reviewed with patient and copy of discharge instructions given to pt per E. Jones, PA. Patient able to return/verbalize discharge instructions.  Pt condition stable, respiratory status WNL, neuro status intact. Ambulatory out of ER.

## 2013-07-28 NOTE — ED Provider Notes (Signed)
I was personally available for consultation in the emergency department.  I have reviewed the chart and agree with the documentation recorded by the MLP, including the assessment, treatment plan, and disposition.  Ilia Engelbert, L Charlee Squibb, MD

## 2013-07-28 NOTE — ED Provider Notes (Signed)
HPI Comments: Kimberly [name] is a 79 y.o. female who presents ambulatory to [facility] ED with cc of rash to the back of the neck with 5/10 associated pain that began 2 days ago. Pt reports sxs initially began as a blister and notes she applied antibiotic ointment on the area about 1 week ago. Pt notes she believed sxs went away but noticed a more pronounced rash to the back of her neck 2 days ago. Pt reports rash is not itchy but notes associated pain. Pt reports hx of chicken pox as a child and notes she has not gotten a shingles vaccination. Pt denies fever, CP, SOB, abd pain, N/V/D.    PCP: Aundria Rud, MD    PMhx is significant for: HTN, hypercholesterolemia, arthritis  PShx is significant for: hysterectomy  Social hx: - Smoke, - EtOH, - Illicit Drugs    There are no other complaints, changes or physical findings at this time. 12:02 PM   Written by Delman Cheadle, ED Scribe, as dictated by Karena Addison.      The history is provided by the patient.        Past Medical History   Diagnosis Date   ??? Hypertension    ??? High cholesterol    ??? Arthritis    ??? Anginal pain         Past Surgical History   Procedure Laterality Date   ??? Hx hysterectomy     ??? Hx orthopaedic       epidural pain mtg         History reviewed. No pertinent family history.     History     Social History   ??? Marital Status: WIDOWED     Spouse Name: N/A     Number of Children: N/A   ??? Years of Education: N/A     Occupational History   ??? Not on file.     Social History Main Topics   ??? Smoking status: Never Smoker    ??? Smokeless tobacco: Not on file   ??? Alcohol Use: No   ??? Drug Use:    ??? Sexually Active:      Other Topics Concern   ??? Not on file     Social History Narrative   ??? No narrative on file         ALLERGIES: Pcn and Sulfa (sulfonamide antibiotics)      Review of Systems   Constitutional: Negative for fever and fatigue.   HENT: Negative for neck pain and neck stiffness.    Eyes: Negative for pain and visual disturbance.   Respiratory:  Negative for cough and shortness of breath.    Cardiovascular: Negative for chest pain and palpitations.   Gastrointestinal: Negative for nausea, vomiting, abdominal pain and diarrhea.   Musculoskeletal: Negative for back pain and gait problem.   Skin: Positive for rash (back of neck).   Neurological: Negative for dizziness, light-headedness and headaches.       Filed Vitals:    07/28/13 1121   BP: 153/92   Pulse: 86   Temp: 98.1 ??F (36.7 ??C)   Resp: 18   Height: 5\' 2"  (1.575 m)   Weight: 57.3 kg (126 lb 5.2 oz)   SpO2: 98%            Physical Exam   Nursing note and vitals reviewed.  Constitutional: She is oriented to person, place, and time. She appears well-developed and well-nourished.   HENT:   Head:  Normocephalic and atraumatic.   Right Ear: External ear normal.   Left Ear: External ear normal.   Nose: Nose normal.   Eyes: Conjunctivae and EOM are normal. Pupils are equal, round, and reactive to light.   Cardiovascular: Normal rate and regular rhythm.    Pulmonary/Chest: Effort normal. No respiratory distress.   Neurological: She is alert and oriented to person, place, and time.   Skin:   Grouped vesicles on erythematous base on the L side of neck along a single dermatome that does not cross the midline.   Psychiatric: She has a normal mood and affect.   Written by, Delman Cheadle, ED Scribe, as dictated by Karena Addison.     MDM     Amount and/or Complexity of Data Reviewed:    Review and summarize past medical records:  Yes  Progress:   Patient progress:  Stable      Procedures  IMPRESSION:  1. Zoster        PLAN:  1. Rx: Valtrex, Norco  2. F/u with Aundria Rud, MD, schedule an appointment as soon as possible for a visit as needed if symptoms worsen  Return to ED if worse     DISCHARGE NOTE  12:09 PM    [name] is ready for discharge. The pt's signs/symptoms, results, diagnosis and discharge instructions have been discussed w/ the pt and/or available family members. The pt conveys understanding  and agreement with the diagnosis and plan. There are no new physical findings, changes or complaints at this time. Pt is recommended to f/u with Aundria Rud, MD or return to ED if condition worsens.   Written by Delman Cheadle, ED Scribe, as dictated by Karena Addison.

## 2013-07-28 NOTE — ED Notes (Signed)
Patient arrives to ED c/o rash on back of neck that has been there for a few days and seems to be spreading. The area is now a small open wound and is pink.

## 2013-08-18 ENCOUNTER — Encounter

## 2014-05-03 LAB — AMB POC URINALYSIS DIP STICK AUTO W/O MICRO
Bilirubin (UA POC): NEGATIVE
Blood (UA POC): NEGATIVE
Glucose (UA POC): NEGATIVE
Ketones (UA POC): NEGATIVE
Leukocyte esterase (UA POC): NEGATIVE
Nitrites (UA POC): NEGATIVE
Protein (UA POC): NEGATIVE mg/dL
Specific gravity (UA POC): 1.01 (ref 1.001–1.035)
Urobilinogen (UA POC): 0.2 (ref 0.2–1)
pH (UA POC): 7 (ref 4.6–8.0)

## 2014-05-03 MED ORDER — EZETIMIBE 10 MG TAB
10 mg | ORAL_TABLET | Freq: Every day | ORAL | Status: DC
Start: 2014-05-03 — End: 2014-05-03

## 2014-05-03 MED ORDER — EZETIMIBE 10 MG TAB
10 mg | ORAL_TABLET | Freq: Every day | ORAL | Status: DC
Start: 2014-05-03 — End: 2016-12-11

## 2014-05-03 MED ORDER — SIMVASTATIN 10 MG TAB
10 mg | ORAL_TABLET | Freq: Every evening | ORAL | Status: DC
Start: 2014-05-03 — End: 2015-11-22

## 2014-05-03 MED ORDER — SIMVASTATIN 10 MG TAB
10 mg | ORAL_TABLET | Freq: Every evening | ORAL | Status: DC
Start: 2014-05-03 — End: 2014-05-03

## 2014-05-03 NOTE — Progress Notes (Signed)
Chief Complaint   Patient presents with   ??? Establish Care     Pt has hx of gerd,htn, cholesterol.        1. Have you been to the ER, urgent care clinic since your last visit?  Hospitalized since your last visit?No    2. Have you seen or consulted any other health care providers outside of the [facility] since your last visit?  Include any pap smears or colon screening. No     Pt see's Dr Brynda Rim for GI, and Dr. Binnie Rail for Opthalmology.   PRESCRIPTION REFILL POLICY  Effective 11/07/3152    In an effort to ensure that the large volume of prescription refills are processed in the most efficient and expeditious manner, we are asking our patients to assist [place] by calling your pharmacy for all prescription refills. This will include Mail Order Pharmacies. The pharmacy will contact our office electronically to continue the refill process.     Please do not wait until the last minute to call your pharmacy. We require 72 hours (3 days) to fill prescriptions. We also encourage you to call your pharmacy before picking up your prescription to make sure it is ready.    With regard to controlled substance refill request (narcotics and many ADD/ADHD treatment prescriptions) and any other prescriptions that need to be picked up at our office, we ask your cooperation by providing [place] with at least 72 hours (3 days) notice prior to your need of the prescription. You will need to show a valid ID when picking up your prescription. Anyone delegated by you to pick up your prescriptions must be listed be listed on you HIPPA authorization form, and show a valid ID.    Narcotics will not be refilled on a weekend or on a holiday in which the office is closed.    We also encourage an alternative method of refill requests, which is through our medically secure web portal, MyChart. This is an efficient and effective way to communicate your requests directly with the office and  provider. MyChart can be downloaded onto your smartphone as an App. If you are ready to be connected, please review the attached instructions or speak to any of our staff to get you set up right away!    Thank you so much for your cooperation. Should you have any questions, please contact our Practice Administrator, William Hamburger at [phone]

## 2014-05-03 NOTE — Patient Instructions (Signed)
Preventing Falls: After Your Visit  Your Care Instructions  Getting around your home safely can be a challenge if you have injuries or health problems that make it easy for you to fall. Loose rugs and furniture in walkways are among the dangers for many older people who have problems walking or who have poor eyesight. People who have conditions such as arthritis, osteoporosis, or dementia also have to be careful not to fall.  You can make your home safer with a few simple measures.  Follow-up care is a key part of your treatment and safety. Be sure to make and go to all appointments, and call your doctor if you are having problems. It's also a good idea to know your test results and keep a list of the medicines you take.  How can you care for yourself at home?  Taking care of yourself  ?? You may get dizzy if you do not drink enough water. To prevent dehydration, drink plenty of fluids, enough so that your urine is light yellow or clear like water. Choose water and other caffeine-free clear liquids. If you have kidney, heart, or liver disease and have to limit fluids, talk with your doctor before you increase the amount of fluids you drink.  ?? Exercise regularly to improve your strength, muscle tone, and balance. Walk if you can. Swimming may be a good choice if you cannot walk easily.  ?? Have your vision and hearing checked each year or any time you notice a change. If you have trouble seeing and hearing, you might not be able to avoid objects and could lose your balance.  ?? Know the side effects of the medicines you take. Ask your doctor or pharmacist whether the medicines you take can affect your balance. Sleeping pills or sedatives can affect your balance.  ?? Limit the amount of alcohol you drink. Alcohol can impair your balance and other senses.  ?? Ask your doctor whether calluses or corns on your feet need to be removed. If you wear loose-fitting shoes because of calluses or corns, you  can lose your balance and fall.  ?? Talk to your doctor if you have numbness in your feet.  Preventing falls at home  ?? Remove raised doorway thresholds, throw rugs, and clutter. Repair loose carpet or raised areas in the floor.  ?? Move furniture and electrical cords to keep them out of walking paths.  ?? Use nonskid floor wax, and wipe up spills right away, especially on ceramic tile floors.  ?? If you use a walker or cane, put rubber tips on it. If you use crutches, clean the bottoms of them regularly with an abrasive pad, such as steel wool.  ?? Keep your house well lit, especially stairways, porches, and outside walkways. Use night-lights in areas such as hallways and bathrooms. Add extra light switches or use remote switches (such as switches that go on or off when you clap your hands) to make it easier to turn lights on if you have to get up during the night.  ?? Install sturdy handrails on stairways.  ?? Move items in your cabinets so that the things you use a lot are on the lower shelves (about waist level).  ?? Keep a cordless phone and a flashlight with new batteries by your bed. If possible, put a phone in each of the main rooms of your house, or carry a cell phone in case you fall and cannot reach a phone. Or, you can wear   a device around your neck or wrist. You push a button that sends a signal for help.  ?? Wear low-heeled shoes that fit well and give your feet good support. Use footwear with nonskid soles. Check the heels and soles of your shoes for wear. Repair or replace worn heels or soles.  ?? Do not wear socks without shoes on wood floors.  ?? Walk on the grass when the sidewalks are slippery. If you live in an area that gets snow and ice in the winter, sprinkle salt on slippery steps and sidewalks.  Preventing falls in the bath  ?? Install grab bars and nonskid mats inside and outside your shower or tub and near the toilet and sinks.  ?? Use shower chairs and bath benches.   ?? Use a hand-held shower head that will allow you to sit while showering.  ?? Get into a tub or shower by putting the weaker leg in first. Get out of a tub or shower with your strong side first.  ?? Repair loose toilet seats and consider installing a raised toilet seat to make getting on and off the toilet easier.  ?? Keep your bathroom door unlocked while you are in the shower.   Where can you learn more?   Go to http://www.healthwise.net/[facility]  Enter G117 in the search box to learn more about "Preventing Falls: After Your Visit."   ?? 2006-2015 Healthwise, Incorporated. Care instructions adapted under license by [facility] (which disclaims liability or warranty for this information). This care instruction is for use with your licensed healthcare professional. If you have questions about a medical condition or this instruction, always ask your healthcare professional. Healthwise, Incorporated disclaims any warranty or liability for your use of this information.  Content Version: 10.5.422740; Current as of: June 19, 2013

## 2014-05-03 NOTE — Progress Notes (Signed)
Subjective:      Kimberly [name] is a 80 y.o. female who presents today for   Chief Complaint   Patient presents with   ??? Establish Care     gerd, hearing aids,    hypertension- bp goes up and down, she is compliant with her meds  Incontinence- takes oxybutynin, compliant no side effects  hyperlipdemia- takes simvastatin and zetia- needs refills today, 90 day supply  CAD- known angina  Jerrye Bushy- takes omeprazole 40 mg po daily  Peripheral vascular disease- takes pletal  Osteoporosis- takes Fosamax daily- several years since last DEXA scan    Was seen at at [facility] last week was told she was ANEMIC. Hgb 10.4, has not noticed any bleeding, no black or tarry stools. She does have a gastro Dr. Brynda Rim.. She last saw him 2 months ago. Takes omeprazole 40 mg po daily for GERD.    Apparently patient reports falls at home recently, denies any pain today        There are no active problems to display for this patient.    Current Outpatient Prescriptions   Medication Sig Dispense Refill   ??? HYDROcodone-acetaminophen (NORCO) 5-325 mg per tablet Take 1 tablet by mouth every four (4) hours as needed for Pain. 20 tablet 0   ??? trazodone (DESYREL) 50 mg tablet      ??? simvastatin (ZOCOR) 10 mg tablet      ??? prednisolone acetate (PRED FORTE) 1 % ophthalmic suspension      ??? phenazopyridine (PYRIDIUM) 100 mg tablet      ??? verapamil SR (CALAN-SR) 180 mg CR tablet Take 180 mg by mouth nightly.     ??? carboxymethylcellulose sodium (REFRESH LIQUIGEL) 1 % dlgl Apply  to eye.     ??? albuterol (PROAIR HFA) 90 mcg/actuation inhaler Take 2 puffs by inhalation every four (4) hours as needed for Wheezing.     ??? traMADol (ULTRAM) 50 mg tablet Take 50 mg by mouth every six (6) hours as needed.     ??? omeprazole (PRILOSEC) 40 mg capsule Take 1 Cap by mouth daily. 30 Cap 0   ??? alendronate-vitamin d3 70-2,800 mg-unit per tablet Take 1 Tab by mouth every seven (7) days.        ??? ALENDRONATE SODIUM (ALENDRONATE PO) Take 70 mg by mouth. 1 tablet weekly on an empty stomach      ??? ascorbic acid (VITAMIN C) 1,000 mg tablet Take  by mouth.     ??? cilostazol (PLETAL) 100 mg tablet Take  by mouth two (2) times a day. On empty stomach     ??? hydrochlorothiazide (MICROZIDE) 12.5 mg capsule Take 12.5 mg by mouth daily.     ??? isosorbide dinitrate (ISORDIL) 10 mg tablet Take  by mouth two (2) times a day.     ??? MULTIVITAMIN PO Take  by mouth.     ??? VITAMIN E MIXED (NATURAL VITAMIN E PO) Take 400 Units by mouth. 3 x week      ??? OMEGA-3 FATTY ACIDS (OMEGA 3 PO) Take 400 mg by mouth. q day      ??? OXYBUTYNIN CHLORIDE PO Take 5 mg by mouth two (2) times a day.     ??? vitamin A 8,000 unit capsule Take 8,000 Units by mouth daily.       Allergies   Allergen Reactions   ??? Pcn [Penicillins] Unknown (comments)   ??? Sulfa (Sulfonamide Antibiotics) Unknown (comments)     Past Medical History  Diagnosis Date   ??? Hypertension    ??? High cholesterol    ??? Arthritis    ??? Anginal pain [facility])      Past Surgical History   Procedure Laterality Date   ??? Hx hysterectomy     ??? Hx orthopaedic       epidural pain mtg     No family history on file.  History   Substance Use Topics   ??? Smoking status: Never Smoker    ??? Smokeless tobacco: Not on file   ??? Alcohol Use: No      Retired [employer], widowed, no grandchildren, lives in ranch style home, her niece and nephew help her out.  Review of Systems    A comprehensive review of systems was negative except for that written in the HPI.     Objective:     Ht 5\' 2"  (1.575 m)   Wt 130 lb (58.968 kg)   BMI 23.77 kg/m2  General:  Alert, cooperative, no distress, appears stated age.   Head:  Normocephalic, without obvious abnormality, atraumatic.   Eyes:  Conjunctivae/corneas clear. PERRL, EOMs intact. Fundi benign.   Ears:  Bilateral hearing aids   Nose: Nares normal. Septum midline. Mucosa normal. No drainage or sinus tenderness.   Throat: Lips, mucosa, and tongue normal. Teeth and gums normal.    Neck: Supple, symmetrical, trachea midline, no adenopathy, thyroid: no enlargement/tenderness/nodules, no carotid bruit and no JVD.   Back:   Symmetric, no curvature. ROM normal. No CVA tenderness.   Lungs:   Clear to auscultation bilaterally.   Chest wall:  No tenderness or deformity.   Heart:  Regular rate and rhythm, S1, S2 normal, no murmur, click, rub or gallop.   Abdomen:   Soft, non-tender. Bowel sounds normal. No masses,  No organomegaly.   Extremities: Extremities normal, atraumatic, no cyanosis or edema.   Pulses: 2+ and symmetric all extremities.   Skin: Skin color, texture, turgor normal. No rashes or lesions.   Lymph nodes: Cervical, supraclavicular, and axillary nodes normal.   Neurologic: CNII-XII intact. Normal strength, sensation and reflexes throughout.       Assessment/Plan:       ICD-9-CM ICD-10-CM    1. Essential hypertension 161.0 R60 METABOLIC PANEL, COMPREHENSIVE      AMB POC URINALYSIS DIP STICK AUTO W/O MICRO   2. Incontinence 788.30 R32    3. Hyperlipidemia 272.4 E78.5 LIPID PANEL   4. Coronary artery disease involving native coronary artery with angina pectoris with documented spasm (HCC) 414.01 I25.111     413.9     5. Gastroesophageal reflux disease without esophagitis 530.81 K21.9    6. PVD (peripheral vascular disease) (HCC) 443.9 I73.9    7. Osteoporosis 733.00 M81.0    8. Fall, initial encounter [id number] W19.XXXA REFERRAL TO HOME HEALTH   9. Vitamin D deficiency 268.9 E55.9 VITAMIN D, 25 HYDROXY   10. Anemia, unspecified anemia type 285.9 D64.9 CBC WITH AUTOMATED DIFF      IRON      AMB POC FECAL BLOOD, OCCULT, QL 3 CARDS   11. Need for influenza vaccination V04.81 Z23 INFLUENZA VIRUS VACCINE, HIGH DOSE SEASONAL, PRESERVATIVE FREE   continue antihypertensives  Refill Zetia and statin  Anemia: will check cbc, iron, also will give patient Hemoccult cards, checked labs as of a year ago HGB was normal. GI is Dr. Brynda Rim   Has yearly mammogram- receives an alert in the mail when it is due  Needs influenza vaccine and will  administer today      Follow-up Disposition: Not on File   Advised her to call back or return to office if symptoms worsen/change/persist.  Discussed expected course/resolution/complications of diagnosis in detail with patient.    Medication risks/benefits/costs/interactions/alternatives discussed with patient.  She was given an after visit summary which includes diagnoses, current medications, & vitals.  She expressed understanding with the diagnosis and plan.

## 2014-05-04 LAB — METABOLIC PANEL, COMPREHENSIVE
A-G Ratio: 1.8 (ref 1.1–2.5)
ALT (SGPT): 17 IU/L (ref 0–32)
AST (SGOT): 20 IU/L (ref 0–40)
Albumin: 4 g/dL (ref 3.5–4.7)
Alk. phosphatase: 81 IU/L (ref 39–117)
BUN/Creatinine ratio: 15 (ref 11–26)
BUN: 11 mg/dL (ref 8–27)
Bilirubin, total: 0.4 mg/dL (ref 0.0–1.2)
CO2: 23 mmol/L (ref 18–29)
Calcium: 9.3 mg/dL (ref 8.7–10.3)
Chloride: 90 mmol/L — ABNORMAL LOW (ref 97–108)
Creatinine: 0.73 mg/dL (ref 0.57–1.00)
GFR est AA: 85 mL/min/{1.73_m2} (ref >59)
GFR est non-AA: 74 mL/min/{1.73_m2} (ref >59)
GLOBULIN, TOTAL: 2.2 g/dL (ref 1.5–4.5)
Glucose: 94 mg/dL (ref 65–99)
Potassium: 4.1 mmol/L (ref 3.5–5.2)
Protein, total: 6.2 g/dL (ref 6.0–8.5)
Sodium: 131 mmol/L — ABNORMAL LOW (ref 134–144)

## 2014-05-04 LAB — CBC WITH AUTOMATED DIFF
ABS. BASOPHILS: 0 10*3/uL (ref 0.0–0.2)
ABS. EOSINOPHILS: 0.1 10*3/uL (ref 0.0–0.4)
ABS. IMM. GRANS.: 0 10*3/uL (ref 0.0–0.1)
ABS. MONOCYTES: 0.4 10*3/uL (ref 0.1–0.9)
ABS. NEUTROPHILS: 2.7 10*3/uL (ref 1.4–7.0)
Abs Lymphocytes: 0.9 10*3/uL (ref 0.7–3.1)
BASOPHILS: 1 %
EOSINOPHILS: 3 %
HCT: 34.7 % (ref 34.0–46.6)
HGB: 11.1 g/dL (ref 11.1–15.9)
IMMATURE GRANULOCYTES: 0 %
Lymphocytes: 22 %
MCH: 23.6 pg — ABNORMAL LOW (ref 26.6–33.0)
MCHC: 32 g/dL (ref 31.5–35.7)
MCV: 74 fL — ABNORMAL LOW (ref 79–97)
MONOCYTES: 9 %
NEUTROPHILS: 65 %
PLATELET: 247 10*3/uL (ref 150–379)
RBC: 4.71 x10E6/uL (ref 3.77–5.28)
RDW: 15.9 % — ABNORMAL HIGH (ref 12.3–15.4)
WBC: 4.1 10*3/uL (ref 3.4–10.8)

## 2014-05-04 LAB — VITAMIN D, 25 HYDROXY: VITAMIN D, 25-HYDROXY: 43.5 ng/mL (ref 30.0–100.0)

## 2014-05-04 LAB — IRON: Iron: 74 ug/dL (ref 35–155)

## 2014-05-04 LAB — LIPID PANEL
Cholesterol, total: 131 mg/dL (ref 100–199)
HDL Cholesterol: 79 mg/dL (ref >39)
LDL, calculated: 46 mg/dL (ref 0–99)
Triglyceride: 28 mg/dL (ref 0–149)
VLDL, calculated: 6 mg/dL (ref 5–40)

## 2014-05-11 ENCOUNTER — Telehealth

## 2014-05-11 NOTE — Telephone Encounter (Deleted)
V.O.R.B given for occult stool sample for Pt Kimberly [name]. Reason anemia.

## 2014-05-12 ENCOUNTER — Telehealth

## 2014-05-13 LAB — OCCULT BLOOD, STOOL: Occult blood fecal, by IA: NEGATIVE

## 2014-05-17 ENCOUNTER — Ambulatory Visit: Admit: 2014-05-17 | Discharge: 2014-05-17 | Payer: MEDICARE | Attending: Internal Medicine | Primary: Family Medicine

## 2014-05-17 DIAGNOSIS — I10 Essential (primary) hypertension: Secondary | ICD-10-CM

## 2014-05-17 NOTE — Progress Notes (Signed)
Chief Complaint   Patient presents with   ??? Results     labs 05/03/14       Pt is here for a 2 week f/u.    Health Maintenance Due   Topic Date Due   ??? Tdap Age > 18  10/11/1942   ??? Td Q 10 Yrs Age > 18  10/11/1942   ??? ZOSTER VACCINE AGE 78>  10/11/1985   ??? GLAUCOMA SCREENING Q2Y  10/12/1990   ??? PNEUMOCOCCAL ADULT PPSV  10/12/1990   ??? MEDICARE YEARLY EXAM  10/12/1990   ??? INFLUENZA AGE 11 TO ADULT  03/06/2014       Have you been to the ER or urgent care clinic since your last visit? No     Have you been hospitalized since your last visit? No    Have you been seen or consulted any other health care provider outside of [facility] since your last visit (including pap smears, colonoscopy screening)? No    I have reviewed the patient's medical history in detail and updated the computerized patient record.

## 2014-05-17 NOTE — Progress Notes (Signed)
Subjective:      Kimberly [name] is a 80 y.o. female who presents today for   Chief Complaint   Patient presents with   ??? Results     labs 05/03/14    patient in today for follow up and to review labs. She also brings in test results from a health screening service  Hypertension: BP has been stable she is compliant with her antihypertensives  Hyperlipidemia: lipid panel looks good, she is compliant with her statin, denies side effects  Anemia: stool cards neg, hgb/hct on repeat is in normal range  Frequent falls: patient denies falls since last visit here  Abnormal EKG: test results from health fair showed abnormal EKG, also has hx per patient of CAD      There are no active problems to display for this patient.    Current Outpatient Prescriptions   Medication Sig Dispense Refill   ??? bromfenac 0.09 % ophthalmic solution Administer 1 Drop to both eyes two (2) times a day.     ??? simvastatin (ZOCOR) 10 mg tablet Take 1 Tab by mouth nightly. 90 Tab 1   ??? ezetimibe (ZETIA) 10 mg tablet Take 1 Tab by mouth daily. 90 Tab 1   ??? HYDROcodone-acetaminophen (NORCO) 5-325 mg per tablet Take 1 tablet by mouth every four (4) hours as needed for Pain. 20 tablet 0   ??? trazodone (DESYREL) 50 mg tablet      ??? prednisolone acetate (PRED FORTE) 1 % ophthalmic suspension      ??? phenazopyridine (PYRIDIUM) 100 mg tablet      ??? verapamil SR (CALAN-SR) 180 mg CR tablet Take 180 mg by mouth nightly.     ??? carboxymethylcellulose sodium (REFRESH LIQUIGEL) 1 % dlgl Apply  to eye.     ??? albuterol (PROAIR HFA) 90 mcg/actuation inhaler Take 2 puffs by inhalation every four (4) hours as needed for Wheezing.     ??? traMADol (ULTRAM) 50 mg tablet Take 50 mg by mouth every six (6) hours as needed.     ??? omeprazole (PRILOSEC) 40 mg capsule Take 1 Cap by mouth daily. 30 Cap 0   ??? alendronate-vitamin d3 70-2,800 mg-unit per tablet Take 1 Tab by mouth every seven (7) days.        ??? ALENDRONATE SODIUM (ALENDRONATE PO) Take 70 mg by mouth. 1 tablet weekly on an empty stomach      ??? ascorbic acid (VITAMIN C) 1,000 mg tablet Take  by mouth.     ??? cilostazol (PLETAL) 100 mg tablet Take  by mouth two (2) times a day. On empty stomach     ??? hydrochlorothiazide (MICROZIDE) 12.5 mg capsule Take 12.5 mg by mouth daily.     ??? isosorbide dinitrate (ISORDIL) 10 mg tablet Take  by mouth two (2) times a day.     ??? MULTIVITAMIN PO Take  by mouth.     ??? VITAMIN E MIXED (NATURAL VITAMIN E PO) Take 400 Units by mouth. 3 x week      ??? OMEGA-3 FATTY ACIDS (OMEGA 3 PO) Take 400 mg by mouth. q day      ??? OXYBUTYNIN CHLORIDE PO Take 5 mg by mouth two (2) times a day.     ??? vitamin A 8,000 unit capsule Take 8,000 Units by mouth daily.       Allergies   Allergen Reactions   ??? Pcn [Penicillins] Unknown (comments)   ??? Sulfa (Sulfonamide Antibiotics) Unknown (comments)     Past  Medical History   Diagnosis Date   ??? Hypertension    ??? High cholesterol    ??? Arthritis    ??? Anginal pain [facility])      Past Surgical History   Procedure Laterality Date   ??? Hx hysterectomy     ??? Hx orthopaedic       epidural pain mtg     History reviewed. No pertinent family history.  History   Substance Use Topics   ??? Smoking status: Never Smoker    ??? Smokeless tobacco: Never Used   ??? Alcohol Use: No        Review of Systems    A comprehensive review of systems was negative except for that written in the HPI.     Objective:     BP 121/69 mmHg   Pulse 71   Temp(Src) 96.6 ??F (35.9 ??C) (Oral)   Resp 16   Ht 5\' 2"  (1.575 m)   Wt 130 lb (58.968 kg)   BMI 23.77 kg/m2   SpO2 96%  General:  Alert, cooperative, no distress, appears stated age.   Head:  Normocephalic, without obvious abnormality, atraumatic.   Eyes:  Conjunctivae/corneas clear. PERRL, EOMs intact. Fundi benign.   Ears:  Normal TMs and external ear canals both ears.   Nose: Nares normal. Septum midline. Mucosa normal. No drainage or sinus tenderness.    Throat: Lips, mucosa, and tongue normal. Teeth and gums normal.   Neck: Supple, symmetrical, trachea midline, no adenopathy, thyroid: no enlargement/tenderness/nodules, no carotid bruit and no JVD.   Back:   Symmetric, no curvature. ROM normal. No CVA tenderness.   Lungs:   Clear to auscultation bilaterally.   Chest wall:  No tenderness or deformity.   Heart:  Regular rate and rhythm, S1, S2 normal, no murmur, click, rub or gallop.   Abdomen:   Soft, non-tender. Bowel sounds normal. No masses,  No organomegaly.   Extremities: Extremities normal, atraumatic, no cyanosis or edema.   Pulses: 2+ and symmetric all extremities.   Skin: Skin color, texture, turgor normal. No rashes or lesions.   Lymph nodes: Cervical, supraclavicular, and axillary nodes normal.   Neurologic: CNII-XII intact. Normal strength, sensation and reflexes throughout.       Assessment/Plan:       ICD-10-CM ICD-9-CM    1. Essential hypertension I10 401.9    2. Anemia, unspecified anemia type D64.9 285.9    3. Frequent falls R29.6 V15.88 REFERRAL TO [facility]   4. Abnormal EKG R94.31 794.31 REFERRAL TO CARDIOLOGY   5. Coronary artery disease due to lipid rich plaque I25.10 414.00 REFERRAL TO CARDIOLOGY     414.3    continue current antihypertensives    Follow-up Disposition: Not on File   Advised her to call back or return to office if symptoms worsen/change/persist.  Discussed expected course/resolution/complications of diagnosis in detail with patient.    Medication risks/benefits/costs/interactions/alternatives discussed with patient.  She was given an after visit summary which includes diagnoses, current medications, & vitals.  She expressed understanding with the diagnosis and plan.

## 2014-05-17 NOTE — Patient Instructions (Signed)
Preventing Falls: After Your Visit  Your Care Instructions  Getting around your home safely can be a challenge if you have injuries or health problems that make it easy for you to fall. Loose rugs and furniture in walkways are among the dangers for many older people who have problems walking or who have poor eyesight. People who have conditions such as arthritis, osteoporosis, or dementia also have to be careful not to fall.  You can make your home safer with a few simple measures.  Follow-up care is a key part of your treatment and safety. Be sure to make and go to all appointments, and call your doctor if you are having problems. It's also a good idea to know your test results and keep a list of the medicines you take.  How can you care for yourself at home?  Taking care of yourself  ?? You may get dizzy if you do not drink enough water. To prevent dehydration, drink plenty of fluids, enough so that your urine is light yellow or clear like water. Choose water and other caffeine-free clear liquids. If you have kidney, heart, or liver disease and have to limit fluids, talk with your doctor before you increase the amount of fluids you drink.  ?? Exercise regularly to improve your strength, muscle tone, and balance. Walk if you can. Swimming may be a good choice if you cannot walk easily.  ?? Have your vision and hearing checked each year or any time you notice a change. If you have trouble seeing and hearing, you might not be able to avoid objects and could lose your balance.  ?? Know the side effects of the medicines you take. Ask your doctor or pharmacist whether the medicines you take can affect your balance. Sleeping pills or sedatives can affect your balance.  ?? Limit the amount of alcohol you drink. Alcohol can impair your balance and other senses.  ?? Ask your doctor whether calluses or corns on your feet need to be removed. If you wear loose-fitting shoes because of calluses or corns, you  can lose your balance and fall.  ?? Talk to your doctor if you have numbness in your feet.  Preventing falls at home  ?? Remove raised doorway thresholds, throw rugs, and clutter. Repair loose carpet or raised areas in the floor.  ?? Move furniture and electrical cords to keep them out of walking paths.  ?? Use nonskid floor wax, and wipe up spills right away, especially on ceramic tile floors.  ?? If you use a walker or cane, put rubber tips on it. If you use crutches, clean the bottoms of them regularly with an abrasive pad, such as steel wool.  ?? Keep your house well lit, especially stairways, porches, and outside walkways. Use night-lights in areas such as hallways and bathrooms. Add extra light switches or use remote switches (such as switches that go on or off when you clap your hands) to make it easier to turn lights on if you have to get up during the night.  ?? Install sturdy handrails on stairways.  ?? Move items in your cabinets so that the things you use a lot are on the lower shelves (about waist level).  ?? Keep a cordless phone and a flashlight with new batteries by your bed. If possible, put a phone in each of the main rooms of your house, or carry a cell phone in case you fall and cannot reach a phone. Or, you can wear   a device around your neck or wrist. You push a button that sends a signal for help.  ?? Wear low-heeled shoes that fit well and give your feet good support. Use footwear with nonskid soles. Check the heels and soles of your shoes for wear. Repair or replace worn heels or soles.  ?? Do not wear socks without shoes on wood floors.  ?? Walk on the grass when the sidewalks are slippery. If you live in an area that gets snow and ice in the winter, sprinkle salt on slippery steps and sidewalks.  Preventing falls in the bath  ?? Install grab bars and nonskid mats inside and outside your shower or tub and near the toilet and sinks.  ?? Use shower chairs and bath benches.   ?? Use a hand-held shower head that will allow you to sit while showering.  ?? Get into a tub or shower by putting the weaker leg in first. Get out of a tub or shower with your strong side first.  ?? Repair loose toilet seats and consider installing a raised toilet seat to make getting on and off the toilet easier.  ?? Keep your bathroom door unlocked while you are in the shower.   Where can you learn more?   Go to http://www.healthwise.net/[facility]  Enter G117 in the search box to learn more about "Preventing Falls: After Your Visit."   ?? 2006-2015 Healthwise, Incorporated. Care instructions adapted under license by [facility] (which disclaims liability or warranty for this information). This care instruction is for use with your licensed healthcare professional. If you have questions about a medical condition or this instruction, always ask your healthcare professional. Healthwise, Incorporated disclaims any warranty or liability for your use of this information.  Content Version: 10.5.422740; Current as of: June 19, 2013

## 2014-06-02 NOTE — Telephone Encounter (Signed)
Patient's niece is calling to speak with Dr. Cira Servant to check on the status of an order for patient to have home health physical therapy. Please Ms. Mohammad [phone].

## 2014-06-07 NOTE — Telephone Encounter (Signed)
New order placed for home health per DR. Cira Servant previous order was greater than 5 days. [name], LPN

## 2014-06-07 NOTE — Telephone Encounter (Signed)
MSW contacted [facility] intake dept re: [facility] referral placed in [place] on 05/17/14.  Writer spoke w/"Kenyae" regarding whether patient is receiving services.  Per Lelan Pons "no one ever contacted [place] to inform us of the order placed in the system, as the order is over 4 days old we will need a brand new order".  "Please remember we're not on the same system and we must be notified every time an order for [facility] is placed".  Writer acknowledged understanding.    Writer will forward message to pcp and LPN Jamse Mead.      Caren Hazy, MSW

## 2014-06-08 NOTE — Telephone Encounter (Signed)
3:50 p.m.    Outbound call to patient's niece (Ms. Inda Merlin) @ [phone].  Identifiers (dob & address) verified per HIPAA policy.  Niece verbalizes "she needs a cane".  Writer informed Ms. Mohammad the home health evaluation will assess all needs and/or requests.  Contact information given to patient's niece.     Caren Hazy, MSW

## 2014-06-08 NOTE — Telephone Encounter (Signed)
Outbound F/U call to [facility] (intake dept).  Identifiers (name & dob) verified per HIPAA policy.  Writer spoke w/"Dawn" re: new referral for [facility] order.    Staff informed "Dawn" of new order in system for patient.  Per Dawn "we will begin to process this".    Writer will notify patient of information.    Caren Hazy, MSW

## 2014-06-14 NOTE — Telephone Encounter (Signed)
Outbound F/U call to patient's niece (Ms. Inda Merlin) @ [phone].  Identifiers (dob & address) verified per HIPAA policy.  Staff inquired if [facility] has been out to evaluate for services.  "Yes, they've come out and she's enrolled.  She's had her second visit already".      Writer will phone [facility] to get a status update on patient.    Caren Hazy, MSW

## 2014-06-15 NOTE — Telephone Encounter (Signed)
Outbound call to [facility] (Intake Dept); spoke w/"Cassandra".  Cassandra reports the following information:    -patient has had two Physical Therapy visits.  No end date for service has been entered into system yet.  -patient declined Occupational Therapy appt scheduled for today due to her having an Audiology appt.  Thus far patient hasn't re-scheduled this visit.  -patient isn't receiving SN/Social Work evaluation.    Caren Hazy, MSW

## 2014-06-21 ENCOUNTER — Ambulatory Visit
Admit: 2014-06-21 | Discharge: 2014-06-21 | Payer: MEDICARE | Attending: Cardiovascular Disease | Primary: Family Medicine

## 2014-06-21 DIAGNOSIS — R0789 Other chest pain: Secondary | ICD-10-CM

## 2014-06-21 NOTE — Progress Notes (Signed)
Chief Complaint   Patient presents with   ??? Other     new patient, has gerd which gives her chest pain in middle of chest and back at night, sob on exertion.

## 2014-06-21 NOTE — Progress Notes (Signed)
Garret Reddish NP    Subjective/HPI:     Kimberly [name] is a 81 y.o. female is here for new patient consultation.  The patient reports non-exertional episodes of epigastric burning, pressure and discomfort at times after eating as well as lying suping. She states belching will alleviate pressure. Is using PPI with relief of symptoms. She denies exertional chest pain, DOE, fatigue or shortness of breath.  She denies [facility] of CAD.  Kimberly [name] had a NST in 09/2012 for the same symptoms and had a coronary angiogram 15 yrs ago without CAD at [facility]. Echo--normal LVEF with moderate LVH.    Patient Active Problem List    Diagnosis Date Noted   ??? SOB (shortness of breath) on exertion 06/21/2014   ??? Hypertension    ??? High cholesterol       Donnel Saxon, MD  Past Medical History   Diagnosis Date   ??? Hypertension    ??? High cholesterol    ??? Arthritis    ??? Anginal pain [facility])       Past Surgical History   Procedure Laterality Date   ??? Hx hysterectomy     ??? Hx orthopaedic       epidural pain mtg     Allergies   Allergen Reactions   ??? Pcn [Penicillins] Unknown (comments)   ??? Sulfa (Sulfonamide Antibiotics) Unknown (comments)      History reviewed. No pertinent family history.   Current Outpatient Prescriptions   Medication Sig   ??? coenzyme q10-vitamin e (COQ10 SG 100) 100-100 mg-unit cap Take  by mouth.   ??? PHOSPHATIDYL SERINE, BULK, by Does Not Apply route.   ??? bromfenac 0.09 % ophthalmic solution Administer 1 Drop to both eyes two (2) times a day.   ??? simvastatin (ZOCOR) 10 mg tablet Take 1 Tab by mouth nightly.   ??? ezetimibe (ZETIA) 10 mg tablet Take 1 Tab by mouth daily.   ??? prednisolone acetate (PRED FORTE) 1 % ophthalmic suspension    ??? verapamil SR (CALAN-SR) 180 mg CR tablet Take 180 mg by mouth nightly.   ??? albuterol (PROAIR HFA) 90 mcg/actuation inhaler Take 2 puffs by inhalation every four (4) hours as needed for Wheezing.   ??? omeprazole (PRILOSEC) 40 mg capsule Take 1 Cap by mouth daily.    ??? alendronate-vitamin d3 70-2,800 mg-unit per tablet Take 1 Tab by mouth every seven (7) days.     ??? ALENDRONATE SODIUM (ALENDRONATE PO) Take 70 mg by mouth. 1 tablet weekly on an empty stomach    ??? ascorbic acid (VITAMIN C) 1,000 mg tablet Take  by mouth.   ??? cilostazol (PLETAL) 100 mg tablet Take  by mouth two (2) times a day. On empty stomach   ??? hydrochlorothiazide (MICROZIDE) 12.5 mg capsule Take 12.5 mg by mouth daily.   ??? isosorbide dinitrate (ISORDIL) 10 mg tablet Take  by mouth two (2) times a day.   ??? VITAMIN E MIXED (NATURAL VITAMIN E PO) Take 400 Units by mouth. 3 x week    ??? OMEGA-3 FATTY ACIDS (OMEGA 3 PO) Take 400 mg by mouth. q day    ??? OXYBUTYNIN CHLORIDE PO Take 5 mg by mouth two (2) times a day.   ??? vitamin A 8,000 unit capsule Take 8,000 Units by mouth daily.   ??? HYDROcodone-acetaminophen (NORCO) 5-325 mg per tablet Take 1 tablet by mouth every four (4) hours as needed for Pain.   ??? trazodone (DESYREL) 50 mg tablet    ???  phenazopyridine (PYRIDIUM) 100 mg tablet    ??? carboxymethylcellulose sodium (REFRESH LIQUIGEL) 1 % dlgl Apply  to eye.   ??? traMADol (ULTRAM) 50 mg tablet Take 50 mg by mouth every six (6) hours as needed.   ??? MULTIVITAMIN PO Take  by mouth.     No current facility-administered medications for this visit.      Filed Vitals:    06/21/14 1027 06/21/14 1055   BP: 122/72 110/70   Pulse: 62    Resp: 12    Height: '5\' 2"'  (1.575 m)    Weight: 128 lb 4.8 oz (58.196 kg)        I have reviewed the nurses notes, vitals, problem list, allergy list, medical history, family, social history and medications.    Review of Symptoms:    General: Pt denies excessive weight gain or loss. Pt is able to conduct ADL's  HEENT: Denies blurred vision, headaches, epistaxis and difficulty swallowing.  Respiratory: Denies shortness of breath, DOE, wheezing or stridor.  Cardiovascular: Denies precordial pain, palpitations, edema or PND  Gastrointestinal: Denies poor appetite, +indigestion, no abdominal pain or  blood in stool  Musculoskeletal: Denies pain or swelling from muscles or joints  Neurologic: Denies tremor, paresthesias, or sensory motor disturbance  Skin: Denies rash, itching or texture change.      Physical Exam: ??    General: Well developed, cooperative, alert in no acute distress, appears states age.  HEENT: Supple, No carotid bruits, no JVD, trach is midline. PERRL, EOM intact  Heart: ??Normal S1/S2 negative S3 or S4. Regular, no murmur, gallop or rub.??  Respiratory: Clear bilaterally x 4, no wheezing or rales  Abdomen:?? ??Soft, non-tender, no masses, bowel sounds are active.??  Extremities:  No edema, normal cap refill, no cyanosis, atraumatic.  Neuro: A&Ox3, speech clear, gait stable.   Skin: Skin color is normal. No rashes or lesions. Non diaphoretic  Vascular: 2+ pulses symmetric in all extremities    Cardiographics    ECG: Sinus with clockwise rotation, minor non-specific ST-T wave changes  Results for orders placed or performed during the hospital encounter of 05/10/13   EKG, 12 LEAD, INITIAL   Result Value Ref Range    Ventricular Rate 77 BPM    Atrial Rate 77 BPM    P-R Interval 140 ms    QRS Duration 108 ms    Q-T Interval 380 ms    QTC Calculation (Bezet) 430 ms    Calculated P Axis 55 degrees    Calculated R Axis -6 degrees    Calculated T Axis 94 degrees    Diagnosis       Normal sinus rhythm  Nonspecific T wave abnormality  When compared with ECG of 31-Aug-2012 15:14,  No significant change was found  Confirmed by Valeta Harms [phone]) on 05/10/2013 8:45:10 AM         Cardiology Labs:  Lab Results   Component Value Date/Time    CHOLESTEROL, TOTAL 131 05/03/2014 01:02 PM    HDL CHOLESTEROL 79 05/03/2014 01:02 PM    LDL, CALCULATED 46 05/03/2014 01:02 PM    TRIGLYCERIDE 28 05/03/2014 01:02 PM       Lab Results   Component Value Date/Time    SODIUM 131 05/03/2014 01:02 PM    POTASSIUM 4.1 05/03/2014 01:02 PM    CHLORIDE 90 05/03/2014 01:02 PM    CO2 23 05/03/2014 01:02 PM     ANION GAP 9 05/10/2013 07:55 AM    GLUCOSE 94 05/03/2014 01:02 PM  BUN 11 05/03/2014 01:02 PM    CREATININE 0.73 05/03/2014 01:02 PM    BUN/CREATININE RATIO 15 05/03/2014 01:02 PM    GFR EST AA 85 05/03/2014 01:02 PM    GFR EST NON-AA 74 05/03/2014 01:02 PM    CALCIUM 9.3 05/03/2014 01:02 PM    BILIRUBIN, TOTAL 0.4 05/03/2014 01:02 PM    ALT 17 05/03/2014 01:02 PM    AST 20 05/03/2014 01:02 PM    ALK. PHOSPHATASE 81 05/03/2014 01:02 PM    PROTEIN, TOTAL 6.2 05/03/2014 01:02 PM    ALBUMIN 4.0 05/03/2014 01:02 PM    GLOBULIN 4.0 05/10/2013 07:55 AM    A-G RATIO 1.8 05/03/2014 01:02 PM           Assessment:     Assessment:      Kimberly [name] was seen today for other.    Diagnoses and all orders for this visit:    Atypical chest pain  Orders:  -     AMB POC EKG ROUTINE W/ 12 LEADS, INTER & REP    Epigastric discomfort    SOB (shortness of breath) on exertion    Hypertension, essential, benign    High cholesterol    Chest pain with normal angiography--~ 15 yrs ago [facility]      Specialty Problems        Cardiology Problems    High cholesterol        Hypertension              ICD-10-CM ICD-9-CM    1. Atypical chest pain R07.89 786.59 coenzyme q10-vitamin e (COQ10 SG 100) 100-100 mg-unit cap      PHOSPHATIDYL SERINE, BULK,      AMB POC EKG ROUTINE W/ 12 LEADS, INTER & REP   2. Epigastric discomfort R10.13 789.06    3. SOB (shortness of breath) on exertion R06.02 786.05    4. Hypertension, essential, benign I10 401.1    5. High cholesterol E78.0 272.0    6. Chest pain with normal angiography--~ 15 yrs ago [facility] R07.9 786.50      Orders Placed This Encounter   ??? AMB POC EKG ROUTINE W/ 12 LEADS, INTER & REP     Order Specific Question:  Reason for Exam:     Answer:  routine   ??? coenzyme q10-vitamin e (COQ10 SG 100) 100-100 mg-unit cap     Sig: Take  by mouth.   ??? PHOSPHATIDYL SERINE, BULK,     Sig: by Does Not Apply route.       PLAN:    Patient presents with atypical chest discomfort suggestive of GERD.  Negative LEXI scan 09/2012 for similar symptoms.  EKG unchanged from 2013. Reassured along with Daughter with her.Continue same. F/U 1 year or sooner if needed.    Patient seen and examined. All pertinent data reviewed. Case discussed with Nursing/medical assistant staff and Baldemar Lenis. Arnold,NP. I have reviewed detailed note as outlined by Baldemar Lenis. Roselie Awkward NP. Patient with atypical chest pain intermittent with negative w/u 1 year ago and negative cardiac cath ~15 yrs ago. EKG's NSR,non-specific ST-T wave changes similar to 2013. Reassured chest pain likely non-cardiac > GI. Plans as outlined.      MARC A ARNOLD, NP     Decari Duggar L. Adrain Nesbit,MD

## 2014-08-18 ENCOUNTER — Ambulatory Visit: Admit: 2014-08-18 | Discharge: 2014-08-18 | Payer: MEDICARE | Attending: Internal Medicine | Primary: Family Medicine

## 2014-08-18 DIAGNOSIS — I10 Essential (primary) hypertension: Secondary | ICD-10-CM

## 2014-08-18 MED ORDER — TRAZODONE 100 MG TAB
100 mg | ORAL_TABLET | Freq: Every evening | ORAL | Status: DC
Start: 2014-08-18 — End: 2015-03-04

## 2014-08-18 NOTE — Patient Instructions (Signed)
Learning About Sleeping Well  What does sleeping well mean?  Sleeping well means getting enough sleep. How much sleep is enough varies among people.  The number of hours you sleep is not as important as how you feel when you wake up. If you do not feel refreshed, you probably need more sleep. Another sign of not getting enough sleep is feeling tired during the day.  The average total nightly sleep time is 7?? to 8 hours. Healthy adults may need a little more or a little less than this.  Why is getting enough sleep important?  Getting enough quality sleep is a basic part of good health. When your sleep suffers, your mood and your thoughts can suffer too. You may find yourself feeling more grumpy or stressed. Not getting enough sleep also can lead to serious problems, including injury, accidents, anxiety, and depression.  What might cause poor sleeping?  Many things can cause sleep problems, including:  ?? Stress. Stress can be caused by fear about a single event, such as giving a speech. Or you may have ongoing stress, such as worry about work or school.  ?? Depression, anxiety, and other mental or emotional conditions.  ?? Changes in your sleep habits or surroundings. This includes changes that happen where you sleep, such as noise, light, or sleeping in a different bed. It also includes changes in your sleep pattern, such as having jet lag or working a late shift.  ?? Health problems, such as pain, breathing problems, and restless legs syndrome.  ?? Lack of regular exercise.  How can you help yourself?  Here are some tips that may help you sleep more soundly and wake up feeling more refreshed.  Your sleeping area   ?? Use your bedroom only for sleeping and sex. A bit of light reading may help you fall asleep. But if it doesn't, do your reading elsewhere in the house. Don't watch TV in bed.  ?? Be sure your bed is big enough to stretch out comfortably, especially if you have a sleep partner.   ?? Keep your bedroom quiet, dark, and cool. Use curtains, blinds, or a sleep mask to block out light. To block out noise, use earplugs, soothing music, or a "white noise" machine.  Your evening and bedtime routine   ?? Get regular exercise, but not within 3 to 4 hours before your bedtime.  ?? Create a relaxing bedtime routine. You might want to take a warm shower or bath, listen to soothing music, or drink a cup of noncaffeinated tea.  ?? Go to bed at the same time every night. And get up at the same time every morning, even if you feel tired.  What to avoid   ?? Limit caffeine (coffee, tea, caffeinated sodas) during the day, and don't have any for at least 4 to 6 hours before bedtime.  ?? Don't drink alcohol before bedtime. Alcohol can cause you to wake up more often during the night.  ?? Don't smoke or use tobacco, especially in the evening. Nicotine can keep you awake.  ?? Don't take naps during the day, especially close to bedtime.  ?? Don't lie in bed awake for too long. If you can't fall asleep, or if you wake up in the middle of the night and can't get back to sleep within 15 minutes or so, get out of bed and go to another room until you feel sleepy.  ?? Don't take medicine right before bed that may keep you   awake or make you feel hyper or energized. Your doctor can tell you if your medicine may do this and if you can take it earlier in the day.  If you can't sleep   ?? Imagine yourself in a peaceful, pleasant scene. Focus on the details and feelings of being in a place that is relaxing.  ?? Get up and do a quiet or boring activity until you feel sleepy.  ?? Don't drink any liquids after 6 p.m. if you wake up often because you have to go to the bathroom.   Where can you learn more?   Go to http://www.healthwise.net/[facility]  Enter J942 in the search box to learn more about "Learning About Sleeping Well."   ?? 2006-2015 Healthwise[employer] Inc[employer]. Care instructions adapted under  license by  (which disclaims liability or warranty for this information). This care instruction is for use with your licensed healthcare professional. If you have questions about a medical condition or this instruction, always ask your healthcare professional. Healthwise, Incorporated disclaims any warranty or liability for your use of this information.  Content Version: 10.7.482551; Current as of: June 19, 2013

## 2014-08-18 NOTE — Progress Notes (Signed)
Kimberly [name]  is a 81 y.o.  female  who present for Pneumovax 23 immunizations/injections.   He/she denies any symptoms , reactions or allergies that would exclude them from being immunized today.  Risks and adverse reactions were discussed and the VIS was given if applicable to them. All questions were addressed.  He/She was observed for 5 min post injection. There were no reactions observed.    Glyn Ade, LPN

## 2014-08-18 NOTE — Progress Notes (Signed)
Subjective:      Kimberly [name] is a 81 y.o. female who presents today for   Chief Complaint   Patient presents with   ??? Follow-up   hypertension- compliant with meds no side effects  Hyperlipidemia- compliant with meds no side effects  Anemia- stool cards were ordered and were negative  Frequent falls-denies any recent falls. Had Home Health occupational and Physical Therapy consults. They have now discontinued services  GERD- has been taking omeprazole  Past med hx of angina/chest pain      Has seen Cardiology- Dr. Sampson Goon since her last visit here      Having some chronic knee pain and back pain. She is not taking any pain medication for this    She is accompanied today by a family member  Insomnia    Check CMP, lipid panel      Patient Active Problem List    Diagnosis Date Noted   ??? SOB (shortness of breath) on exertion 06/21/2014   ??? Chest pain with normal angiography--~ 15 yrs ago MCV 06/21/2014   ??? Hypertension, essential, benign    ??? High cholesterol      Current Outpatient Prescriptions   Medication Sig Dispense Refill   ??? coenzyme q10-vitamin e (COQ10 SG 100) 100-100 mg-unit cap Take  by mouth.     ??? PHOSPHATIDYL SERINE, BULK, by Does Not Apply route.     ??? bromfenac 0.09 % ophthalmic solution Administer 1 Drop to both eyes two (2) times a day.     ??? simvastatin (ZOCOR) 10 mg tablet Take 1 Tab by mouth nightly. 90 Tab 1   ??? prednisolone acetate (PRED FORTE) 1 % ophthalmic suspension      ??? verapamil SR (CALAN-SR) 180 mg CR tablet Take 180 mg by mouth nightly.     ??? carboxymethylcellulose sodium (REFRESH LIQUIGEL) 1 % dlgl Apply  to eye.     ??? albuterol (PROAIR HFA) 90 mcg/actuation inhaler Take 2 puffs by inhalation every four (4) hours as needed for Wheezing.     ??? omeprazole (PRILOSEC) 40 mg capsule Take 1 Cap by mouth daily. 30 Cap 0   ??? alendronate-vitamin d3 70-2,800 mg-unit per tablet Take 1 Tab by mouth every seven (7) days.        ??? ascorbic acid (VITAMIN C) 1,000 mg tablet Take  by mouth.     ??? cilostazol (PLETAL) 100 mg tablet Take  by mouth two (2) times a day. On empty stomach     ??? hydrochlorothiazide (MICROZIDE) 12.5 mg capsule Take 12.5 mg by mouth daily.     ??? MULTIVITAMIN PO Take  by mouth.     ??? VITAMIN E MIXED (NATURAL VITAMIN E PO) Take 400 Units by mouth. 3 x week      ??? OXYBUTYNIN CHLORIDE PO Take 5 mg by mouth two (2) times a day.     ??? vitamin A 8,000 unit capsule Take 8,000 Units by mouth daily.     ??? ezetimibe (ZETIA) 10 mg tablet Take 1 Tab by mouth daily. 90 Tab 1   ??? HYDROcodone-acetaminophen (NORCO) 5-325 mg per tablet Take 1 tablet by mouth every four (4) hours as needed for Pain. 20 tablet 0   ??? trazodone (DESYREL) 50 mg tablet      ??? phenazopyridine (PYRIDIUM) 100 mg tablet      ??? traMADol (ULTRAM) 50 mg tablet Take 50 mg by mouth every six (6) hours as needed.     ??? ALENDRONATE  SODIUM (ALENDRONATE PO) Take 70 mg by mouth. 1 tablet weekly on an empty stomach      ??? isosorbide dinitrate (ISORDIL) 10 mg tablet Take  by mouth two (2) times a day.     ??? OMEGA-3 FATTY ACIDS (OMEGA 3 PO) Take 400 mg by mouth. q day        Allergies   Allergen Reactions   ??? Pcn [Penicillins] Unknown (comments)   ??? Sulfa (Sulfonamide Antibiotics) Unknown (comments)     Past Medical History   Diagnosis Date   ??? Hypertension    ??? High cholesterol    ??? Arthritis    ??? Anginal pain [facility])      Past Surgical History   Procedure Laterality Date   ??? Hx hysterectomy     ??? Hx orthopaedic       epidural pain mtg     History reviewed. No pertinent family history.  History   Substance Use Topics   ??? Smoking status: Never Smoker    ??? Smokeless tobacco: Never Used   ??? Alcohol Use: No        Review of Systems    A comprehensive review of systems was negative except for that written in the HPI.     Objective:     BP 144/67 mmHg   Pulse 89   Temp(Src) 97.8 ??F (36.6 ??C) (Oral)   Resp 18   Ht 5\' 2"  (1.575 m)   Wt 131 lb (59.421 kg)   BMI 23.95 kg/m2   General:  Alert, cooperative, no distress, appears stated age.   Head:  Normocephalic, without obvious abnormality, atraumatic.   Eyes:  Conjunctivae/corneas clear. PERRL, EOMs intact. Fundi benign.   Ears:  Normal TMs and external ear canals both ears.   Nose: Nares normal. Septum midline. Mucosa normal. No drainage or sinus tenderness.   Throat: Lips, mucosa, and tongue normal. Teeth and gums normal.   Neck: Supple, symmetrical, trachea midline, no adenopathy, thyroid: no enlargement/tenderness/nodules, no carotid bruit and no JVD.   Back:   Symmetric, no curvature. ROM normal. No CVA tenderness.   Lungs:   Clear to auscultation bilaterally.   Chest wall:  No tenderness or deformity.   Heart:  Regular rate and rhythm, S1, S2 normal, no murmur, click, rub or gallop.   Abdomen:   Soft, non-tender. Bowel sounds normal. No masses,  No organomegaly.   Extremities: Extremities normal, atraumatic, no cyanosis or edema.   Pulses: 2+ and symmetric all extremities.   Skin: Skin color, texture, turgor normal. No rashes or lesions.   Lymph nodes: Cervical, supraclavicular, and axillary nodes normal.   Neurologic: CNII-XII intact. Normal strength, sensation and reflexes throughout.       Assessment/Plan:       ICD-10-CM ICD-9-CM    1. Essential hypertension I10 401.9    2. Hyperlipidemia U98.1 191.4 METABOLIC PANEL, COMPREHENSIVE      LIPID PANEL   3. Iron deficiency anemia D50.9 280.9    4. Gastroesophageal reflux disease without esophagitis K21.9 530.81    5. Primary osteoarthritis involving multiple joints M15.0 715.09    6. Insomnia G47.00 780.52    7. Encounter for immunization Z23 V03.89 PNEUMOCOCCAL POLYSACCHARIDE VACCINE, 23-VALENT, ADULT OR IMMUNOSUPPRESSED PT DOSE,       Increase trazodone to 100mg  qhs  Extra strength tylenol 500 mg 2 tabs q 8 hours    Follow-up Disposition: Not on File   Advised her to call back or return to office if symptoms worsen/change/persist.  Discussed expected course/resolution/complications of diagnosis in detail with patient.    Medication risks/benefits/costs/interactions/alternatives discussed with patient.  She was given an after visit summary which includes diagnoses, current medications, & vitals.  She expressed understanding with the diagnosis and plan.

## 2014-08-19 LAB — METABOLIC PANEL, COMPREHENSIVE
A-G Ratio: 1.5 (ref 1.1–2.5)
ALT (SGPT): 14 IU/L (ref 0–32)
AST (SGOT): 19 IU/L (ref 0–40)
Albumin: 4 g/dL (ref 3.5–4.7)
Alk. phosphatase: 79 IU/L (ref 39–117)
BUN/Creatinine ratio: 15 (ref 11–26)
BUN: 13 mg/dL (ref 8–27)
Bilirubin, total: 0.3 mg/dL (ref 0.0–1.2)
CO2: 25 mmol/L (ref 18–29)
Calcium: 9.4 mg/dL (ref 8.7–10.3)
Chloride: 94 mmol/L — ABNORMAL LOW (ref 97–108)
Creatinine: 0.84 mg/dL (ref 0.57–1.00)
GFR est AA: 72 mL/min/{1.73_m2} (ref >59)
GFR est non-AA: 62 mL/min/{1.73_m2} (ref >59)
GLOBULIN, TOTAL: 2.6 g/dL (ref 1.5–4.5)
Glucose: 100 mg/dL — ABNORMAL HIGH (ref 65–99)
Potassium: 4 mmol/L (ref 3.5–5.2)
Protein, total: 6.6 g/dL (ref 6.0–8.5)
Sodium: 134 mmol/L (ref 134–144)

## 2014-08-19 LAB — LIPID PANEL
Cholesterol, total: 157 mg/dL (ref 100–199)
HDL Cholesterol: 84 mg/dL (ref >39)
LDL, calculated: 63 mg/dL (ref 0–99)
Triglyceride: 49 mg/dL (ref 0–149)
VLDL, calculated: 10 mg/dL (ref 5–40)

## 2014-08-20 ENCOUNTER — Encounter: Attending: Internal Medicine | Primary: Family Medicine

## 2014-09-08 NOTE — Telephone Encounter (Signed)
Patient called requesting to speak with Dr. Cira Servant. Patient did not want to disclose reason for call. Patient would like a call back.

## 2014-09-10 ENCOUNTER — Encounter

## 2014-11-11 MED ORDER — VERAPAMIL ER 180 MG TAB
180 mg | ORAL_TABLET | Freq: Every evening | ORAL | Status: DC
Start: 2014-11-11 — End: 2015-10-28

## 2014-11-13 NOTE — Telephone Encounter (Signed)
Kimberly [name] is due back to see me this month. Please make sure she has an appt. Also will prob need Medicare Wellness check.

## 2014-11-16 ENCOUNTER — Inpatient Hospital Stay: Admit: 2014-11-16 | Payer: MEDICARE | Attending: Internal Medicine | Primary: Family Medicine

## 2014-11-16 DIAGNOSIS — Z1231 Encounter for screening mammogram for malignant neoplasm of breast: Secondary | ICD-10-CM

## 2014-11-17 ENCOUNTER — Ambulatory Visit: Admit: 2014-11-17 | Discharge: 2014-11-17 | Payer: MEDICARE | Attending: Internal Medicine | Primary: Family Medicine

## 2014-11-17 DIAGNOSIS — F5101 Primary insomnia: Secondary | ICD-10-CM

## 2014-11-17 NOTE — Progress Notes (Signed)
Subjective:      Kimberly [name] is a 81 y.o. female who presents today for   Chief Complaint   Patient presents with   ??? Follow-up       Patient is accompanied to visit by her niece who is her power of attorney      Still having insomnia, could not tolerate trazodone so she discontinued it, gets about 4 hrs sleep per night, denies caffeine other than what is in Boost meal supplement close to bedtime     she was seen at [facility] recently and had bacterial infection of her eye. Eye had been itching and burning. she was given drops and says itching is better now.    Sees Dr. Binnie Rail- ophthalmology last visit  Feb 2016-    Patient's niece says she has noticed a mild decline in patient's memory and she is becoming more forgetful. At this time patient still performs some of her ADLS. She does require help with bathing and needs assistance with ambulation. She has a history of falls and home health has been consulted in the past. Requires  aid with all  IADLs. She lives alone and has someone to come in to help with housework once or twice a week    Patient Active Problem List    Diagnosis Date Noted   ??? SOB (shortness of breath) on exertion 06/21/2014   ??? Chest pain with normal angiography--~ 15 yrs ago [facility]CV 06/21/2014   ??? Hypertension, essential, benign    ??? High cholesterol      Current Outpatient Prescriptions   Medication Sig Dispense Refill   ??? verapamil ER (CALAN-SR) 180 mg CR tablet Take 1 Tab by mouth nightly. 90 Tab 1   ??? traZODone (DESYREL) 100 mg tablet Take 1 Tab by mouth nightly. 30 Tab 3   ??? coenzyme q10-vitamin e (COQ10 SG 100) 100-100 mg-unit cap Take  by mouth.     ??? PHOSPHATIDYL SERINE, BULK, by Does Not Apply route.     ??? simvastatin (ZOCOR) 10 mg tablet Take 1 Tab by mouth nightly. 90 Tab 1   ??? ezetimibe (ZETIA) 10 mg tablet Take 1 Tab by mouth daily. 90 Tab 1   ??? HYDROcodone-acetaminophen (NORCO) 5-325 mg per tablet Take 1 tablet by mouth every four (4) hours as needed for Pain. 20 tablet 0    ??? trazodone (DESYREL) 50 mg tablet      ??? phenazopyridine (PYRIDIUM) 100 mg tablet      ??? carboxymethylcellulose sodium (REFRESH LIQUIGEL) 1 % dlgl Apply  to eye.     ??? albuterol (PROAIR HFA) 90 mcg/actuation inhaler Take 2 puffs by inhalation every four (4) hours as needed for Wheezing.     ??? traMADol (ULTRAM) 50 mg tablet Take 50 mg by mouth every six (6) hours as needed.     ??? omeprazole (PRILOSEC) 40 mg capsule Take 1 Cap by mouth daily. 30 Cap 0   ??? alendronate-vitamin d3 70-2,800 mg-unit per tablet Take 1 Tab by mouth every seven (7) days.       ??? ALENDRONATE SODIUM (ALENDRONATE PO) Take 70 mg by mouth. 1 tablet weekly on an empty stomach      ??? ascorbic acid (VITAMIN C) 1,000 mg tablet Take  by mouth.     ??? cilostazol (PLETAL) 100 mg tablet Take  by mouth two (2) times a day. On empty stomach     ??? hydrochlorothiazide (MICROZIDE) 12.5 mg capsule Take 12.5 mg by mouth  daily.     ??? isosorbide dinitrate (ISORDIL) 10 mg tablet Take  by mouth two (2) times a day.     ??? MULTIVITAMIN PO Take  by mouth.     ??? VITAMIN E MIXED (NATURAL VITAMIN E PO) Take 400 Units by mouth. 3 x week      ??? OMEGA-3 FATTY ACIDS (OMEGA 3 PO) Take 400 mg by mouth. q day      ??? OXYBUTYNIN CHLORIDE PO Take 5 mg by mouth two (2) times a day.     ??? vitamin A 8,000 unit capsule Take 8,000 Units by mouth daily.     ??? bromfenac 0.09 % ophthalmic solution Administer 1 Drop to both eyes two (2) times a day.     ??? prednisolone acetate (PRED FORTE) 1 % ophthalmic suspension        Allergies   Allergen Reactions   ??? Pcn [Penicillins] Unknown (comments)   ??? Sulfa (Sulfonamide Antibiotics) Unknown (comments)     Past Medical History   Diagnosis Date   ??? Hypertension    ??? High cholesterol    ??? Arthritis    ??? Anginal pain [name])      Past Surgical History   Procedure Laterality Date   ??? Hx hysterectomy     ??? Hx orthopaedic       epidural pain mtg     History reviewed. No pertinent family history.  History   Substance Use Topics    ??? Smoking status: Never Smoker    ??? Smokeless tobacco: Never Used   ??? Alcohol Use: No        Review of Systems    A comprehensive review of systems was negative except for that written in the HPI.     Objective:     BP 127/65 mmHg   Pulse 77   Temp(Src) 98 ??F (36.7 ??C) (Oral)   Resp 18   Ht 5\' 2"  (1.575 m)   Wt 130 lb (58.968 kg)   BMI 23.77 kg/m2  General:  Alert, cooperative, no distress, appears stated age.   Head:  Normocephalic, without obvious abnormality, atraumatic.   Eyes:  Right eye mildly injected, eyelids are swollen PERRL, EOMs intact. Fundi benign.   Ears:  Normal TMs and external ear canals both ears.   Nose: Nares normal. Septum midline. Mucosa normal. No drainage or sinus tenderness.   Throat: Lips, mucosa, and tongue normal. Teeth and gums normal.   Neck: Supple, symmetrical, trachea midline, no adenopathy, thyroid: no enlargement/tenderness/nodules, no carotid bruit and no JVD.   Back:   Symmetric, no curvature. ROM normal. No CVA tenderness.   Lungs:   Clear to auscultation bilaterally.   Chest wall:  No tenderness or deformity.   Heart:  Regular rate and rhythm, S1, S2 normal, no murmur, click, rub or gallop.   Abdomen:   Soft, non-tender. Bowel sounds normal. No masses,  No organomegaly.   Extremities: Extremities normal, atraumatic, no cyanosis or edema.   Pulses: 2+ and symmetric all extremities.   Skin: Skin color, texture, turgor normal. No rashes or lesions.   Lymph nodes: Cervical, supraclavicular, and axillary nodes normal.   Neurologic: Gait imbalance       Assessment/Plan:       ICD-10-CM ICD-9-CM    1. Primary insomnia F51.01 307.42    2. Memory loss R41.3 780.93    3. Acute bacterial conjunctivitis of right eye H10.021 372.03      For insomnia will try melatonin  Memory loss discussed with patient and her niece. May ne normal aging vs mild cognitive impairment. Discussed MMSE and more extensive work up at next visit   Gait imbalance- patient requiring assistance to ambulate, advise Home Health consult  Refer to ophthalmologist regarding conjunctivitis and blepharitis.per patient she has completed the antibacterial drops prescribed by acute care. There is only moderate improvement.     Follow-up Disposition: Not on File   Advised her to call back or return to office if symptoms worsen/change/persist.  Discussed expected course/resolution/complications of diagnosis in detail with patient.    Medication risks/benefits/costs/interactions/alternatives discussed with patient.  She was given an after visit summary which includes diagnoses, current medications, & vitals.  She expressed understanding with the diagnosis and plan.

## 2014-11-17 NOTE — Patient Instructions (Signed)
Pinkeye: Care Instructions  Your Care Instructions     Pinkeye is redness and swelling of the eye surface and the conjunctiva (the lining of the eyelid and the covering of the white part of the eye). Pinkeye is also called conjunctivitis. Pinkeye is often caused by infection with bacteria or a virus. Dry air, allergies, smoke, and chemicals are other common causes.  Pinkeye often clears on its own in 7 to 10 days. Antibiotics only help if the pinkeye is caused by bacteria. Pinkeye caused by infection spreads easily. If an allergy or chemical is causing pinkeye, it will not go away unless you can avoid whatever is causing it.  Follow-up care is a key part of your treatment and safety. Be sure to make and go to all appointments, and call your doctor if you are having problems. It???s also a good idea to know your test results and keep a list of the medicines you take.  How can you care for yourself at home?  ?? Wash your hands often. Always wash them before and after you treat pinkeye or touch your eyes or face.  ?? Use moist cotton or a clean, wet cloth to remove crust. Wipe from the inside corner of the eye to the outside. Use a clean part of the cloth for each wipe.  ?? Put cold or warm wet cloths on your eye a few times a day if the eye hurts.  ?? Do not wear contact lenses or eye makeup until the pinkeye is gone. Throw away any eye makeup you were using when you got pinkeye. Clean your contacts and storage case. If you wear disposable contacts, use a new pair when your eye has cleared and it is safe to wear contacts again.  ?? If the doctor gave you antibiotic ointment or eyedrops, use them as directed. Use the medicine for as long as instructed, even if your eye starts looking better soon. Keep the bottle tip clean, and do not let it touch the eye area.  ?? To put in eyedrops or ointment:  ?? Tilt your head back, and pull your lower eyelid down with one finger.  ?? Drop or squirt the medicine inside the lower lid.   ?? Close your eye for 30 to 60 seconds to let the drops or ointment move around.  ?? Do not touch the ointment or dropper tip to your eyelashes or any other surface.  ?? Do not share towels, pillows, or washcloths while you have pinkeye.  When should you call for help?  Call your doctor now or seek immediate medical care if:  ?? You have pain in your eye, not just irritation on the surface.  ?? You have a change in vision or loss of vision.  ?? You have an increase in discharge from the eye.  ?? Your eye has not started to improve or begins to get worse within 48 hours after you start using antibiotics.  ?? Pinkeye lasts longer than 7 days.  Watch closely for changes in your health, and be sure to contact your doctor if you have any problems.   Where can you learn more?   Go to http://www.healthwise.net/[facility]  Enter Y392 in the search box to learn more about "Pinkeye: Care Instructions."   ?? 2006-2015 Healthwise, Incorporated. Care instructions adapted under license by [employer] (which disclaims liability or warranty for this information). This care instruction is for use with your licensed healthcare professional. If you have questions about a medical   condition or this instruction, always ask your healthcare professional. [employer] any warranty or liability for your use of this information.  Content Version: 10.7.482551; Current as of: June 19, 2013              Insomnia: Care Instructions  Your Care Instructions  Insomnia is the inability to sleep well. It is a common problem for most people at some time. Insomnia may make it hard for you to get to sleep, stay asleep, or sleep as long as you need to. This can make you tired and grouchy during the day. It can also make you forgetful, less effective at work, and unhappy.  Insomnia can be caused by conditions such as depression or anxiety. Pain can also affect your ability to sleep. When these problems are solved, the  insomnia usually clears up. But sometimes bad sleep habits can cause insomnia.  If insomnia is affecting your work or your enjoyment of life, you can take steps to improve your sleep.  Follow-up care is a key part of your treatment and safety. Be sure to make and go to all appointments, and call your doctor if you are having problems. It???s also a good idea to know your test results and keep a list of the medicines you take.  How can you care for yourself at home?  What to avoid   ?? Do not have drinks with caffeine, such as coffee or black tea, for 8 hours before bed.  ?? Do not smoke or use other types of tobacco near bedtime. Nicotine is a stimulant and can keep you awake.  ?? Avoid drinking alcohol late in the evening, because it can cause you to wake in the middle of the night.  ?? Do not eat a big meal close to bedtime. If you are hungry, eat a light snack.  ?? Do not drink a lot of water close to bedtime, because the need to urinate may wake you up during the night.  ?? Do not read or watch TV in bed. Use the bed only for sleeping and sexual activity.  What to try   ?? Go to bed at the same time every night, and wake up at the same time every morning. Do not take naps during the day.  ?? Keep your bedroom quiet, dark, and cool.  ?? Get regular exercise, but not within 3 to 4 hours of your bedtime.  ?? Sleep on a comfortable pillow and mattress.  ?? If watching the clock makes you anxious, turn it facing away from you so you cannot see the time.  ?? If you worry when you lie down, start a worry book. Well before bedtime, write down your worries, and then set the book and your concerns aside.  ?? Try meditation or other relaxation techniques before you go to bed.  ?? If you cannot fall asleep, get up and go to another room until you feel sleepy. Do something relaxing. Repeat your bedtime routine before you go to bed again.  ?? Make your house quiet and calm about an hour before bedtime. Turn down  the lights, turn off the TV, log off the computer, and turn down the volume on music. This can help you relax after a busy day.  When should you call for help?  Watch closely for changes in your health, and be sure to contact your doctor if:  ?? Your efforts to improve your sleep do not work.  ?? Your insomnia  gets worse.  ?? You have been feeling down, depressed, or hopeless or have lost interest in things that you usually enjoy.   Where can you learn more?   Go to [employer]  Enter P513 in the search box to learn more about "Insomnia: Care Instructions."   ?? 2006-2015 Healthwise, Incorporated. Care instructions adapted under license by [employer] (which disclaims liability or warranty for this information). This care instruction is for use with your licensed healthcare professional. If you have questions about a medical condition or this instruction, always ask your healthcare professional. [employer] any warranty or liability for your use of this information.  Content Version: 10.7.482551; Current as of: June 19, 2013

## 2014-11-17 NOTE — Progress Notes (Deleted)
Subjective:      Kimberly [name] is a 81 y.o. female who presents today for   Chief Complaint   Patient presents with   ??? Follow-up      Still having insomnia, could not tolerate trazodone, gets about 4 hrs sleep per night, denies caffeine other than what is in Boost    Sees Dr. Binnie Rail- ophthalmology  In Feb- she was seen at [facility] recently and had bacterial infection she was given dropsand says itching is better now. She says her drops were stopped in Feb by her ophthalmologist.      Patient Active Problem List    Diagnosis Date Noted   ??? SOB (shortness of breath) on exertion 06/21/2014   ??? Chest pain with normal angiography--~ 15 yrs ago [facility] 06/21/2014   ??? Hypertension, essential, benign    ??? High cholesterol      Current Outpatient Prescriptions   Medication Sig Dispense Refill   ??? verapamil ER (CALAN-SR) 180 mg CR tablet Take 1 Tab by mouth nightly. 90 Tab 1   ??? traZODone (DESYREL) 100 mg tablet Take 1 Tab by mouth nightly. 30 Tab 3   ??? coenzyme q10-vitamin e (COQ10 SG 100) 100-100 mg-unit cap Take  by mouth.     ??? PHOSPHATIDYL SERINE, BULK, by Does Not Apply route.     ??? simvastatin (ZOCOR) 10 mg tablet Take 1 Tab by mouth nightly. 90 Tab 1   ??? ezetimibe (ZETIA) 10 mg tablet Take 1 Tab by mouth daily. 90 Tab 1   ??? HYDROcodone-acetaminophen (NORCO) 5-325 mg per tablet Take 1 tablet by mouth every four (4) hours as needed for Pain. 20 tablet 0   ??? trazodone (DESYREL) 50 mg tablet      ??? phenazopyridine (PYRIDIUM) 100 mg tablet      ??? carboxymethylcellulose sodium (REFRESH LIQUIGEL) 1 % dlgl Apply  to eye.     ??? albuterol (PROAIR HFA) 90 mcg/actuation inhaler Take 2 puffs by inhalation every four (4) hours as needed for Wheezing.     ??? traMADol (ULTRAM) 50 mg tablet Take 50 mg by mouth every six (6) hours as needed.     ??? omeprazole (PRILOSEC) 40 mg capsule Take 1 Cap by mouth daily. 30 Cap 0   ??? alendronate-vitamin d3 70-2,800 mg-unit per tablet Take 1 Tab by mouth every seven (7) days.        ??? ALENDRONATE SODIUM (ALENDRONATE PO) Take 70 mg by mouth. 1 tablet weekly on an empty stomach      ??? ascorbic acid (VITAMIN C) 1,000 mg tablet Take  by mouth.     ??? cilostazol (PLETAL) 100 mg tablet Take  by mouth two (2) times a day. On empty stomach     ??? hydrochlorothiazide (MICROZIDE) 12.5 mg capsule Take 12.5 mg by mouth daily.     ??? isosorbide dinitrate (ISORDIL) 10 mg tablet Take  by mouth two (2) times a day.     ??? MULTIVITAMIN PO Take  by mouth.     ??? VITAMIN E MIXED (NATURAL VITAMIN E PO) Take 400 Units by mouth. 3 x week      ??? OMEGA-3 FATTY ACIDS (OMEGA 3 PO) Take 400 mg by mouth. q day      ??? OXYBUTYNIN CHLORIDE PO Take 5 mg by mouth two (2) times a day.     ??? vitamin A 8,000 unit capsule Take 8,000 Units by mouth daily.     ??? bromfenac 0.09 %  ophthalmic solution Administer 1 Drop to both eyes two (2) times a day.     ??? prednisolone acetate (PRED FORTE) 1 % ophthalmic suspension        Allergies   Allergen Reactions   ??? Pcn [Penicillins] Unknown (comments)   ??? Sulfa (Sulfonamide Antibiotics) Unknown (comments)     Past Medical History   Diagnosis Date   ??? Hypertension    ??? High cholesterol    ??? Arthritis    ??? Anginal pain [facility])      Past Surgical History   Procedure Laterality Date   ??? Hx hysterectomy     ??? Hx orthopaedic       epidural pain mtg     History reviewed. No pertinent family history.  History   Substance Use Topics   ??? Smoking status: Never Smoker    ??? Smokeless tobacco: Never Used   ??? Alcohol Use: No        Review of Systems    {ROS - Complete:30496}     Objective:     BP 127/65 mmHg   Pulse 77   Temp(Src) 98 ??F (36.7 ??C) (Oral)   Resp 18   Ht 5\' 2"  (1.575 m)   Wt 130 lb (58.968 kg)   BMI 23.77 kg/m2  General:  Alert, cooperative, no distress, appears stated age.   Head:  Normocephalic, without obvious abnormality, atraumatic.   Eyes:  Conjunctivae/corneas clear. PERRL, EOMs intact. Fundi benign.   Ears:  Normal TMs and external ear canals both ears.    Nose: Nares normal. Septum midline. Mucosa normal. No drainage or sinus tenderness.   Throat: Lips, mucosa, and tongue normal. Teeth and gums normal.   Neck: Supple, symmetrical, trachea midline, no adenopathy, thyroid: no enlargement/tenderness/nodules, no carotid bruit and no JVD.   Back:   Symmetric, no curvature. ROM normal. No CVA tenderness.   Lungs:   Clear to auscultation bilaterally.   Chest wall:  No tenderness or deformity.   Heart:  Regular rate and rhythm, S1, S2 normal, no murmur, click, rub or gallop.   Abdomen:   Soft, non-tender. Bowel sounds normal. No masses,  No organomegaly.   Extremities: Extremities normal, atraumatic, no cyanosis or edema.   Pulses: 2+ and symmetric all extremities.   Skin: Skin color, texture, turgor normal. No rashes or lesions.   Lymph nodes: Cervical, supraclavicular, and axillary nodes normal.   Neurologic: CNII-XII intact. Normal strength, sensation and reflexes throughout.       Assessment/Plan:   {ASSESSMENT/PLAN:19072}    Follow-up Disposition: Not on File   Advised her to call back or return to office if symptoms worsen/change/persist.  Discussed expected course/resolution/complications of diagnosis in detail with patient.    Medication risks/benefits/costs/interactions/alternatives discussed with patient.  She was given an after visit summary which includes diagnoses, current medications, & vitals.  She expressed understanding with the diagnosis and plan.

## 2014-11-17 NOTE — Progress Notes (Signed)
Chief Complaint   Patient presents with   ??? Follow-up     1. Have you been to the ER, urgent care clinic since your last visit?  Hospitalized since your last visit? Yes, [facility] 11/09/14 for bacterial infection in right eye.     2. Have you seen or consulted any other health care providers outside of the [facility] since your last visit?  Include any pap smears or colon screening. No

## 2014-11-21 DIAGNOSIS — F5101 Primary insomnia: Secondary | ICD-10-CM

## 2015-01-05 NOTE — Telephone Encounter (Addendum)
Ms. Rosana Fret (patient's aunt) has requested to speak with Dr. Cira Servant with respects to discussing the patient long-term care. The information needs to be in writing for her disability. Please advise!

## 2015-01-07 NOTE — Telephone Encounter (Signed)
They need to follow up with an office visit and bring paperwork

## 2015-01-07 NOTE — Telephone Encounter (Signed)
30 minute appt for disability forms

## 2015-01-20 ENCOUNTER — Encounter: Attending: Internal Medicine | Primary: Family Medicine

## 2015-03-02 ENCOUNTER — Ambulatory Visit: Admit: 2015-03-02 | Discharge: 2015-03-02 | Payer: MEDICARE | Attending: Internal Medicine | Primary: Family Medicine

## 2015-03-02 DIAGNOSIS — F5101 Primary insomnia: Secondary | ICD-10-CM

## 2015-03-02 LAB — AMB POC URINALYSIS DIP STICK AUTO W/O MICRO
Bilirubin (UA POC): NEGATIVE
Blood (UA POC): NEGATIVE
Glucose (UA POC): NEGATIVE
Ketones (UA POC): NEGATIVE
Leukocyte esterase (UA POC): NEGATIVE
Nitrites (UA POC): NEGATIVE
Protein (UA POC): NEGATIVE mg/dL
Specific gravity (UA POC): 1.015 (ref 1.001–1.035)
Urobilinogen (UA POC): 0.2 (ref 0.2–1)
pH (UA POC): 5.5 (ref 4.6–8.0)

## 2015-03-02 NOTE — Progress Notes (Signed)
Subjective:      Kimberly [name] is a 81 y.o. female who presents today for No chief complaint on file.    Patient in today for follow up. She has several chronic medical issues which are stable  HTN  Hyperlipidemia  Incontinence  PVD  Osteoporosis  GERD  Visual impediment    She is in today for follow up. She is accompanied by her niece    Her niece brings in paperwork as patient and family are trying to obtain assistance via patient's long term care policy. Patient and her niece state that although patient lives alone in her house she now will need assistance.  There have been problems noted with ambulation,  She has gait instablity and i  using a cane to ambulate. She has not fallen since the last visit however when walking often requires leaning on another person to get around even with the cane. She is not able to bathe or shower without help. Also requires assist with dressing. Patient no longer can cook safely and reports recently burning food on the stove. She does not shop or drive independently. She requires help with managing her meds. She requires help with her finances. Her niece also states that the patient is experiencing some changes in memory and is becoming more forgetful.    Patient Active Problem List    Diagnosis Date Noted   ??? 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 Memory loss 11/21/2014   ??? Acute bacterial conjunctivitis of right eye 11/21/2014   ??? SOB (shortness of breath) on exertion 06/21/2014   ??? Chest pain with normal angiography--~ 15 yrs ago [facility]CV 06/21/2014   ??? Hypertension, essential, benign    ??? High cholesterol      Current Outpatient Prescriptions   Medication Sig Dispense Refill   ??? aspirin 81 mg chewable tablet Take 81 mg by mouth daily.     ??? verapamil ER (CALAN-SR) 180 mg CR tablet Take 1 Tab by mouth nightly. 90 Tab 1   ??? coenzyme q10-vitamin e (COQ10 SG 100) 100-100 mg-unit cap Take  by mouth.     ??? simvastatin (ZOCOR) 10 mg tablet Take 1 Tab by mouth nightly. 90 Tab 1    ??? ezetimibe (ZETIA) 10 mg tablet Take 1 Tab by mouth daily. 90 Tab 1   ??? omeprazole (PRILOSEC) 40 mg capsule Take 1 Cap by mouth daily. 30 Cap 0   ??? ALENDRONATE SODIUM (ALENDRONATE PO) Take 70 mg by mouth. 1 tablet weekly on an empty stomach      ??? ascorbic acid (VITAMIN C) 1,000 mg tablet Take  by mouth.     ??? cilostazol (PLETAL) 100 mg tablet Take  by mouth two (2) times a day. On empty stomach     ??? hydrochlorothiazide (MICROZIDE) 12.5 mg capsule Take 12.5 mg by mouth daily.     ??? isosorbide dinitrate (ISORDIL) 10 mg tablet Take  by mouth two (2) times a day.     ??? VITAMIN E MIXED (NATURAL VITAMIN E PO) Take 400 Units by mouth. 3 x week      ??? OXYBUTYNIN CHLORIDE PO Take 5 mg by mouth two (2) times a day.     ??? vitamin A 8,000 unit capsule Take 8,000 Units by mouth daily.     ??? traZODone (DESYREL) 100 mg tablet Take 1 Tab by mouth nightly. 30 Tab 3   ??? PHOSPHATIDYL SERINE, BULK, by Does Not Apply route.     ??? bromfenac 0.09 %  ophthalmic solution Administer 1 Drop to both eyes two (2) times a day.     ??? HYDROcodone-acetaminophen (NORCO) 5-325 mg per tablet Take 1 tablet by mouth every four (4) hours as needed for Pain. 20 tablet 0   ??? trazodone (DESYREL) 50 mg tablet      ??? prednisolone acetate (PRED FORTE) 1 % ophthalmic suspension      ??? phenazopyridine (PYRIDIUM) 100 mg tablet      ??? carboxymethylcellulose sodium (REFRESH LIQUIGEL) 1 % dlgl Apply  to eye.     ??? albuterol (PROAIR HFA) 90 mcg/actuation inhaler Take 2 puffs by inhalation every four (4) hours as needed for Wheezing.     ??? traMADol (ULTRAM) 50 mg tablet Take 50 mg by mouth every six (6) hours as needed.     ??? alendronate-vitamin d3 70-2,800 mg-unit per tablet Take 1 Tab by mouth every seven (7) days.       ??? MULTIVITAMIN PO Take  by mouth.     ??? OMEGA-3 FATTY ACIDS (OMEGA 3 PO) Take 400 mg by mouth. q day        Allergies   Allergen Reactions   ??? Pcn [Penicillins] Unknown (comments)   ??? Sulfa (Sulfonamide Antibiotics) Unknown (comments)      Past Medical History   Diagnosis Date   ??? Hypertension    ??? High cholesterol    ??? Arthritis    ??? Anginal pain [facility])      Past Surgical History   Procedure Laterality Date   ??? Hx hysterectomy     ??? Hx orthopaedic       epidural pain mtg     No family history on file.  History   Substance Use Topics   ??? Smoking status: Never Smoker    ??? Smokeless tobacco: Never Used   ??? Alcohol Use: No        Review of Systems    A comprehensive review of systems was negative except for that written in the HPI.     Objective:     BP 100/49 mmHg   Pulse 63   Temp(Src) 97.4 ??F (36.3 ??C) (Oral)   Resp 18   Ht 5\' 2"  (1.575 m)   Wt 128 lb (58.06 kg)   BMI 23.41 kg/m2  General:  Alert, cooperative, no distress, appears stated age. frail   Head:  Normocephalic, without obvious abnormality, atraumatic.   Eyes:  Conjunctivae/corneas clear. PERRL, EOMs intact. Fundi benign.   Ears:  Normal TMs and external ear canals both ears.   Nose: Nares normal. Septum midline. Mucosa normal. No drainage or sinus tenderness.   Throat: Lips, mucosa, and tongue normal. Teeth and gums normal.   Neck: Supple, symmetrical, trachea midline, no adenopathy, thyroid: no enlargement/tenderness/nodules, no carotid bruit and no JVD.   Back:   Symmetric, no curvature. ROM normal. No CVA tenderness.   Lungs:   Clear to auscultation bilaterally.   Chest wall:  No tenderness or deformity.   Heart:  Regular rate and rhythm, S1, S2 normal, no murmur, click, rub or gallop.   Abdomen:   Soft, non-tender. Bowel sounds normal. No masses,  No organomegaly.   Extremities: Extremities normal, atraumatic, no cyanosis or edema.   Pulses: 2+ and symmetric all extremities.   Skin: Skin color, texture, turgor normal. No rashes or lesions.   Lymph nodes: Cervical, supraclavicular, and axillary nodes normal.   Neurologic: CNII-XII intact. Normal strength, sensation and reflexes throughout.  Psych: alert and oriented x 3, knows day of week and date,  knows her full name and birthdate, knows city and state, knows who the current president is and is also able to tell me the names of the 2 candidates  Running for office. She did have some issue with short term memory. With 3 word recall she was able to remember only 2 of the words.    Assessment/Plan:       ICD-10-CM ICD-9-CM    1. Primary insomnia F51.01 307.42    2. Memory loss Z32.9 924.26 METABOLIC PANEL, COMPREHENSIVE      CBC WITH AUTOMATED DIFF      AMB POC URINALYSIS DIP STICK AUTO W/O MICRO      VITAMIN B12      TSH 3RD GENERATION      RPR   3. Hypertension, essential, benign S34 196.2 METABOLIC PANEL, COMPREHENSIVE   4. High cholesterol I29.7 989.2 METABOLIC PANEL, COMPREHENSIVE      LIPID PANEL   5. Gait instability R26.81 781.2    6. Osteoporosis M81.0 733.00 VITAMIN D, 25 HYDROXY   7. Vitamin D deficiency E55.9 268.9 VITAMIN D, 25 HYDROXY       Follow-up Disposition: Not on File   Advised her to call back or return to office if symptoms worsen/change/persist.  Discussed expected course/resolution/complications of diagnosis in detail with patient.    Medication risks/benefits/costs/interactions/alternatives discussed with patient.  She was given an after visit summary which includes diagnoses, current medications, & vitals.  She expressed understanding with the diagnosis and plan.

## 2015-03-02 NOTE — Progress Notes (Signed)
No chief complaint on file.    1. Have you been to the ER, urgent care clinic since your last visit?  Hospitalized since your last visit?No    2. Have you seen or consulted any other health care providers outside of the [facility] since your last visit?  Include any pap smears or colon screening. No

## 2015-03-09 LAB — RPR
RPR: NONREACTIVE
RPR: NONREACTIVE

## 2015-03-09 LAB — LIPID PANEL
Cholesterol, total: 197 mg/dL (ref 100–199)
HDL Cholesterol: 79 mg/dL (ref >39)
LDL, calculated: 111 mg/dL — ABNORMAL HIGH (ref 0–99)
Triglyceride: 36 mg/dL (ref 0–149)
VLDL, calculated: 7 mg/dL (ref 5–40)

## 2015-03-09 LAB — METABOLIC PANEL, COMPREHENSIVE
A-G Ratio: 1.6 (ref 1.1–2.5)
ALT (SGPT): 26 IU/L (ref 0–32)
AST (SGOT): 23 IU/L (ref 0–40)
Albumin: 4.1 g/dL (ref 3.5–4.7)
Alk. phosphatase: 77 IU/L (ref 39–117)
BUN/Creatinine ratio: 17 (ref 11–26)
BUN: 13 mg/dL (ref 8–27)
Bilirubin, total: 0.3 mg/dL (ref 0.0–1.2)
CO2: 23 mmol/L (ref 18–29)
Calcium: 9.2 mg/dL (ref 8.7–10.3)
Chloride: 98 mmol/L (ref 97–108)
Creatinine: 0.77 mg/dL (ref 0.57–1.00)
GFR est AA: 79 mL/min/{1.73_m2} (ref >59)
GFR est non-AA: 69 mL/min/{1.73_m2} (ref >59)
GLOBULIN, TOTAL: 2.6 g/dL (ref 1.5–4.5)
Glucose: 87 mg/dL (ref 65–99)
Potassium: 4.5 mmol/L (ref 3.5–5.2)
Protein, total: 6.7 g/dL (ref 6.0–8.5)
Sodium: 136 mmol/L (ref 134–144)

## 2015-03-09 LAB — CBC WITH AUTOMATED DIFF
ABS. BASOPHILS: 0.1 10*3/uL (ref 0.0–0.2)
ABS. EOSINOPHILS: 0.1 10*3/uL (ref 0.0–0.4)
ABS. IMM. GRANS.: 0 10*3/uL (ref 0.0–0.1)
ABS. MONOCYTES: 0.3 10*3/uL (ref 0.1–0.9)
ABS. NEUTROPHILS: 2.1 10*3/uL (ref 1.4–7.0)
Abs Lymphocytes: 0.9 10*3/uL (ref 0.7–3.1)
BASOPHILS: 2 %
EOSINOPHILS: 4 %
HCT: 36.7 % (ref 34.0–46.6)
HGB: 11.9 g/dL (ref 11.1–15.9)
IMMATURE GRANULOCYTES: 0 %
Lymphocytes: 26 %
MCH: 23.4 pg — ABNORMAL LOW (ref 26.6–33.0)
MCHC: 32.4 g/dL (ref 31.5–35.7)
MCV: 72 fL — ABNORMAL LOW (ref 79–97)
MONOCYTES: 9 %
NEUTROPHILS: 59 %
PLATELET: 276 10*3/uL (ref 150–379)
RBC: 5.09 x10E6/uL (ref 3.77–5.28)
RDW: 16.1 % — ABNORMAL HIGH (ref 12.3–15.4)
WBC: 3.4 10*3/uL (ref 3.4–10.8)

## 2015-03-09 LAB — VITAMIN D, 25 HYDROXY: VITAMIN D, 25-HYDROXY: 41 ng/mL (ref 30.0–100.0)

## 2015-03-09 LAB — VITAMIN B12: Vitamin B12: 1014 pg/mL — ABNORMAL HIGH (ref 211–946)

## 2015-03-09 LAB — TSH 3RD GENERATION: TSH: 0.957 u[IU]/mL (ref 0.450–4.500)

## 2015-04-05 NOTE — Telephone Encounter (Signed)
Patient was sure that she requested this medication last week, but there's no records of a request. Patient will be using her last pills today, please refill at your earliest convenience.

## 2015-04-06 MED ORDER — CILOSTAZOL 100 MG TAB
100 mg | ORAL_TABLET | Freq: Two times a day (BID) | ORAL | 4 refills | Status: DC
Start: 2015-04-06 — End: 2015-10-02

## 2015-06-29 MED ORDER — HYDROCHLOROTHIAZIDE 12.5 MG CAP
12.5 mg | ORAL_CAPSULE | Freq: Every day | ORAL | 1 refills | Status: DC
Start: 2015-06-29 — End: 2015-10-02

## 2015-07-01 NOTE — Telephone Encounter (Signed)
Patient is experiencing frequent urination with pain. Patient was informed to schedule an appointment. But, prefers to speak with Dr. Cira Servant. Please advise!

## 2015-07-04 NOTE — Telephone Encounter (Signed)
Frequent urination with pain is a sign of a UTI. She needs to come in for evaluation. If she can not come here she should go to acute care. Needs to get this checked asap or it can get out of hand.

## 2015-07-06 ENCOUNTER — Ambulatory Visit
Admit: 2015-07-06 | Discharge: 2015-07-06 | Payer: MEDICARE | Attending: Cardiovascular Disease | Primary: Family Medicine

## 2015-07-06 DIAGNOSIS — R0602 Shortness of breath: Secondary | ICD-10-CM

## 2015-07-06 NOTE — Progress Notes (Signed)
[facility]   [address], [place]                                          NEW PATIENT HPI/FOLLOW-UP      NAME:  Kimberly [name]   DOB:   Jan 25, 1924   MRN:   [id number]   PCP:  Darlys Gales, MD    ??????    Subjective:     The patient is a 81 y.o. year old female  who returns for a routine follow-up. Since the last visit, patient reports no new symptoms. Reports GERD symptoms are well controlled with Zantac. Reports anginal chest pain and SOB is stable, occurs predictably with certain level of exertion. She specifically denies chest pain at rest or change ft exertion tolerance. She reports a pain in her right LE with skin changes but denies edema to LE.    Denies change in exercise tolerance, medication intolerance, palpitations, PND/orthopnea wheezing, sputum, syncope, dizziness or light headedness. Doing satisfactorily.    Pt notes her concern over BP reading here in the office. States she is usually much better. Pt's Aide, who is present during the interview said they got lost on the way to the appointment, (having to find a new office) and pt felt rushed this morning.     Review of Systems  General: Pt denies excessive weight gain or loss. Pt is able to conduct ADL's.  Respiratory: +shortness of breath, DOE, Denies wheezing or stridor.  Cardiovascular: +angina, denies palpitations, edema or PND  Gastrointestinal: Denies poor appetite, indigestion, abdominal pain or blood in stool  Peripheral vascular: Denies claudication, leg cramps  Neuropsychiatric: Denies paresthesias,tingling,numbness,anxiety,depression,fatigue  Musculoskeletal: +RLE pain,tenderness, soreness, denies swelling      Past Medical History   Diagnosis Date   ??? Anginal pain ([place])    ??? Arthritis    ??? High cholesterol    ??? Hypertension      Patient Active Problem List    Diagnosis Date Noted   ??? 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 Memory loss 11/21/2014    ??? Acute bacterial conjunctivitis of right eye 11/21/2014   ??? SOB (shortness of breath) on exertion 06/21/2014   ??? Chest pain with normal angiography--~ 15 yrs ago MCV 06/21/2014   ??? Hypertension, essential, benign    ??? High cholesterol       Past Surgical History   Procedure Laterality Date   ??? Hx hysterectomy     ??? Hx orthopaedic       epidural pain mtg     Allergies   Allergen Reactions   ??? Pcn [Penicillins] Unknown (comments)   ??? Sulfa (Sulfonamide Antibiotics) Unknown (comments)      No family history on file.   Social History     Social History   ??? Marital status: WIDOWED     Spouse name: N/A   ??? Number of children: N/A   ??? Years of education: N/A     Occupational History   ??? Not on file.     Social History Main Topics   ??? Smoking status: Never Smoker   ??? Smokeless tobacco: Never Used   ??? Alcohol use No   ??? Drug use: No   ??? Sexual activity: Not on file     Other Topics Concern   ??? Not on file  Social History Narrative      Current Outpatient Prescriptions   Medication Sig   ??? HYDROcodone-acetaminophen (NORCO) 5-325 mg per tablet    ??? hydroCHLOROthiazide (MICROZIDE) 12.5 mg capsule Take 1 Cap by mouth daily.   ??? cilostazol (PLETAL) 100 mg tablet Take 1 Tab by mouth two (2) times a day. On empty stomach   ??? aspirin 81 mg chewable tablet Take 81 mg by mouth daily.   ??? verapamil ER (CALAN-SR) 180 mg CR tablet Take 1 Tab by mouth nightly.   ??? coenzyme q10-vitamin e (COQ10 SG 100) 100-100 mg-unit cap Take  by mouth.   ??? simvastatin (ZOCOR) 10 mg tablet Take 1 Tab by mouth nightly.   ??? ezetimibe (ZETIA) 10 mg tablet Take 1 Tab by mouth daily.   ??? albuterol (PROAIR HFA) 90 mcg/actuation inhaler Take 2 puffs by inhalation every four (4) hours as needed for Wheezing.   ??? omeprazole (PRILOSEC) 40 mg capsule Take 1 Cap by mouth daily.   ??? ALENDRONATE SODIUM (ALENDRONATE PO) Take 70 mg by mouth. 1 tablet weekly on an empty stomach    ??? ascorbic acid (VITAMIN C) 1,000 mg tablet Take  by mouth.    ??? isosorbide dinitrate (ISORDIL) 10 mg tablet Take  by mouth two (2) times a day.   ??? VITAMIN E MIXED (NATURAL VITAMIN E PO) Take 400 Units by mouth. 3 x week    ??? OXYBUTYNIN CHLORIDE PO Take 5 mg by mouth two (2) times a day.   ??? vitamin A 8,000 unit capsule Take 8,000 Units by mouth daily.     No current facility-administered medications for this visit.         I have reviewed the MA's notes, vitals, problem list, allergy list, medical history, family medical, social history and medications.        Objective:     Physical Exam:     Vitals:    07/06/15 1008   BP: 140/62   Pulse: 85   SpO2: 96%   Weight: 128 lb (58.1 kg)   Height: '5\' 2"'  (1.575 m)    Body mass index is 23.41 kg/(m^2).    General: Well developed, in no acute distress.  HEENT: No carotid bruits, no JVD, trach is midline.   Heart: ??Normal S1/S2 negative S3 or S4. Regular, no murmur, gallop or rub.??  Respiratory: Clear bilaterally, no wheezing or rales  Abdomen:?? ??Soft, non-tender, bowel sounds are active.??  Extremities:  No edema, normal cap refill, no cyanosis.  Neuro: A&Ox3, speech clear, gait stable.   Skin: Skin color is normal. No rashes or lesions. No diaphoresis.  Vascular: 2+ pulses symmetric in all extremities        Data Review:       Cardiographics:    EKG: NSR, non-specific T-wave changes.     Cardiology Labs: reviewed    Results for orders placed or performed during the hospital encounter of 05/10/13   EKG, 12 LEAD, INITIAL   Result Value Ref Range    Ventricular Rate 77 BPM    Atrial Rate 77 BPM    P-R Interval 140 ms    QRS Duration 108 ms    Q-T Interval 380 ms    QTC Calculation (Bezet) 430 ms    Calculated P Axis 55 degrees    Calculated R Axis -6 degrees    Calculated T Axis 94 degrees    Diagnosis       Normal sinus rhythm  Nonspecific T wave  abnormality  When compared with ECG of 31-Aug-2012 15:14,  No significant change was found  Confirmed by Valeta Harms [phone]) on 05/10/2013 8:45:10 AM       Lab Results    Component Value Date/Time    CHOLESTEROL, TOTAL 197 03/08/2015 09:03 AM    HDL CHOLESTEROL 79 03/08/2015 09:03 AM    LDL, CALCULATED 111 03/08/2015 09:03 AM    TRIGLYCERIDE 36 03/08/2015 09:03 AM       Lab Results   Component Value Date/Time    SODIUM 136 03/08/2015 09:03 AM    POTASSIUM 4.5 03/08/2015 09:03 AM    CHLORIDE 98 03/08/2015 09:03 AM    CO2 23 03/08/2015 09:03 AM    ANION GAP 9 05/10/2013 07:55 AM    GLUCOSE 87 03/08/2015 09:03 AM    BUN 13 03/08/2015 09:03 AM    CREATININE 0.77 03/08/2015 09:03 AM    BUN/CREATININE RATIO 17 03/08/2015 09:03 AM    GFR EST AA 79 03/08/2015 09:03 AM    GFR EST NON-AA 69 03/08/2015 09:03 AM    CALCIUM 9.2 03/08/2015 09:03 AM    BILIRUBIN, TOTAL 0.3 03/08/2015 09:03 AM    ALT 26 03/08/2015 09:03 AM    AST 23 03/08/2015 09:03 AM    ALK. PHOSPHATASE 77 03/08/2015 09:03 AM    PROTEIN, TOTAL 6.7 03/08/2015 09:03 AM    ALBUMIN 4.1 03/08/2015 09:03 AM    GLOBULIN 4.0 05/10/2013 07:55 AM    A-G RATIO 1.6 03/08/2015 09:03 AM          Assessment:       ICD-10-CM ICD-9-CM    1. SOB (shortness of breath) on exertion R06.02 786.05 AMB POC EKG ROUTINE W/ 12 LEADS, INTER & REP   2. Hypertension, essential, benign I10 401.1    3. High cholesterol E78.00 272.0    4. Memory loss R41.3 780.93    5. Chest pain with normal angiography--~ 15 yrs ago MCV R07.9 786.50          Discussion: Patient presents at this time stable from a cardiac perspective. Pleased with present status. If varicosities continue to bother pt or worsen, she has been instructed to see Dr. Vonzella Nipple at [facility]. Pt advised to take home BP readings at different times of day and report back to [place] if readings continue to be elevated. Pt notes understanding.      Plan:     1.Continue same meds. Lipid profile and labs followed by PCP.    2.Encouraged to exercise to tolerance and follow low fat, low cholesterol, low sodium predominantly Plant-based (consider Mediterranean) diet. Call  with questions or concerns. Will follow up any test results by phone and/or f/u here in office if needed.Marland Kitchen      3.Follow up: 1 year    4. Dr. Vonzella Nipple for varicosities     5. Home BP readings, and call back if continued elevation    I have discussed the diagnosis with the patient and the intended plan as seen in the above orders.  The patient has received an after-visit summary and questions were answered concerning future plans.  I have discussed any concerning medication side effects and warnings with the patient as well.    Patient seen and examined. All pertinent data reviewed. I have reviewed detailed note as outlined by Bebe Shaggy. Case discussed with Nursing/medical assistant staff and Bebe Shaggy. Patient cardiac stable. Has varicosities to be evaluation if worse.  Plans as outlined.  Lebron Conners, PA-C  07/06/2015     Leshia Kope [place] Graff

## 2015-07-06 NOTE — Patient Instructions (Signed)
-- If blood pressures are high at home over the next few weeks, please call our office.  -- If the varicose veins become more painful, please make an appt with Dr. Vonzella Nipple at the [facility] ([place])  -- Please make a follow up visit with [place] in 1 year.    Varicose Veins: Care Instructions  Your Care Instructions  Varicose veins are twisted, enlarged veins near the surface of the skin. They develop most often in the legs and ankles.  Some people may be more likely than others to get varicose veins because of aging or hormone changes or because a parent has them. Being overweight or pregnant can make varicose veins worse. Jobs that require standing for long periods of time also can make them worse.  Follow-up care is a key part of your treatment and safety. Be sure to make and go to all appointments, and call your doctor if you are having problems. It's also a good idea to know your test results and keep a list of the medicines you take.  How can you care for yourself at home?  ?? Wear compression stockings during the day to help relieve symptoms. They improve blood flow and are the main treatment for varicose veins. Talk to your doctor about which ones to get and where to get them.  ?? Prop up your legs at or above the level of your heart when possible. This helps keep the blood from pooling in your lower legs and improves blood flow to the rest of your body.  ?? Avoid sitting and standing for long periods. This puts added stress on your veins.  ?? Get regular exercise, and control your weight. Walk, bicycle, or swim to improve blood flow in your legs.  ?? If you bump your leg so hard that you know it is likely to bruise, prop up your leg and put ice or a cold pack on the area for 10 to 20 minutes at a time. Try to do this every 1 to 2 hours for the next 3 days (when you are awake) or until the swelling goes down. Put a thin cloth between the ice and your skin.   ?? If you cut or scratch the skin over a vein, it may bleed a lot. Prop up your leg and apply firm pressure with a clean bandage over the site of the bleeding. Continue to apply pressure for a full 15 minutes. Do not check sooner to see if the bleeding has stopped. If the bleeding has not stopped after 15 minutes, apply pressure again for another 15 minutes. You can repeat this up to 3 times for a total of 45 minutes.  If you have a blood clot in a varicose vein, you may have tenderness and swelling over the vein. The vein may feel firm. Be sure to call your doctor right away if you have these symptoms. If your doctor has told you how to care for the clot, follow his or her instructions. Care may include the following:  ?? Prop up your leg and apply heat with a warm, damp cloth or a heating pad set on low (put a towel or cloth between your leg and the heating pad to prevent burns).  ?? Ask your doctor if you can take an over-the-counter pain medicine, such as acetaminophen (Tylenol), ibuprofen (Advil, Motrin), or naproxen (Aleve). Be safe with medicines. Read and follow all instructions on the label.  When should you call for help?  Call 911 anytime you think you may need emergency care. For example, call if:  ?? You have sudden chest pain and shortness of breath, or you cough up blood.  Call your doctor now or seek immediate medical care if:  ?? You have signs of a blood clot, such as:  ?? Pain in your calf, back of the knee, thigh, or groin.  ?? Redness and swelling in your leg or groin.  ?? A varicose vein begins to bleed and you cannot stop it.  ?? You have a tender lump in your leg.  ?? You get an open sore.  Watch closely for changes in your health, and be sure to contact your doctor if:  ?? Your varicose vein symptoms do not improve with home treatment.  Where can you learn more?  Go to [employer]  Enter B637 in the search box to learn more about "Varicose Veins: Care Instructions."   ?? 2006-2016 [employer]. Care instructions adapted under license by [employer] (which disclaims liability or warranty for this information). This care instruction is for use with your licensed healthcare professional. If you have questions about a medical condition or this instruction, always ask your healthcare professional. [place] any warranty or liability for your use of this information.  Content Version: 11.0.578772; Current as of: January 08, 2015

## 2015-08-10 ENCOUNTER — Ambulatory Visit: Admit: 2015-08-10 | Discharge: 2015-08-10 | Payer: MEDICARE | Attending: Internal Medicine | Primary: Family Medicine

## 2015-08-10 ENCOUNTER — Encounter

## 2015-08-10 DIAGNOSIS — I10 Essential (primary) hypertension: Secondary | ICD-10-CM

## 2015-08-10 NOTE — Progress Notes (Signed)
Subjective:      Kimberly [name] is a 82 y.o. female who presents today for   Chief Complaint   Patient presents with   ??? Complete Physical     Patient states that when she has episodes of Jerrye Bushy she has pain in the back   ??? Leg Pain     Patient was given info to see Dr. Vonzella Nipple for varicose vein problem by cardiologist     Patient says she has cramps in her legs and feel like pinpricks. Cardiologist has advised she see a specialist. An appointment has not been made yet    Patient underwent surgery on her rt foot since last visit. She saw Dr. Rob Hickman podiatrist    Flu and pneumovax are up to date.   She thinks she had the zostavax    Health Screening Update  DEXA in 2013 showed osteoporosis- advised calcium and vit D. Will get updated study ordered  Mammogram due spring 2017  Pap smears have been discontinued due to hysterectomy  Colonoscopy deferred    Patient Active Problem List    Diagnosis Date Noted   ??? 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 Memory loss 11/21/2014   ??? Acute bacterial conjunctivitis of right eye 11/21/2014   ??? SOB (shortness of breath) on exertion 06/21/2014   ??? Chest pain with normal angiography--~ 15 yrs ago MCV 06/21/2014   ??? Hypertension, essential, benign    ??? High cholesterol      Current Outpatient Prescriptions   Medication Sig Dispense Refill   ??? hydroCHLOROthiazide (MICROZIDE) 12.5 mg capsule Take 1 Cap by mouth daily. 90 Cap 1   ??? cilostazol (PLETAL) 100 mg tablet Take 1 Tab by mouth two (2) times a day. On empty stomach 60 Tab 4   ??? aspirin 81 mg chewable tablet Take 81 mg by mouth daily.     ??? verapamil ER (CALAN-SR) 180 mg CR tablet Take 1 Tab by mouth nightly. 90 Tab 1   ??? coenzyme q10-vitamin e (COQ10 SG 100) 100-100 mg-unit cap Take  by mouth.     ??? simvastatin (ZOCOR) 10 mg tablet Take 1 Tab by mouth nightly. 90 Tab 1   ??? ezetimibe (ZETIA) 10 mg tablet Take 1 Tab by mouth daily. 90 Tab 1   ??? omeprazole (PRILOSEC) 40 mg capsule Take 1 Cap by mouth daily. 30 Cap 0    ??? ascorbic acid (VITAMIN C) 1,000 mg tablet Take  by mouth.     ??? isosorbide dinitrate (ISORDIL) 10 mg tablet Take  by mouth two (2) times a day.     ??? VITAMIN E MIXED (NATURAL VITAMIN E PO) Take 400 Units by mouth. 3 x week      ??? OXYBUTYNIN CHLORIDE PO Take 5 mg by mouth two (2) times a day.     ??? vitamin A 8,000 unit capsule Take 8,000 Units by mouth daily.     ??? HYDROcodone-acetaminophen (NORCO) 5-325 mg per tablet      ??? albuterol (PROAIR HFA) 90 mcg/actuation inhaler Take 2 puffs by inhalation every four (4) hours as needed for Wheezing.     ??? ALENDRONATE SODIUM (ALENDRONATE PO) Take 70 mg by mouth. 1 tablet weekly on an empty stomach        Allergies   Allergen Reactions   ??? Pcn [Penicillins] Unknown (comments)   ??? Sulfa (Sulfonamide Antibiotics) Unknown (comments)     Past Medical History   Diagnosis Date   ??? Anginal pain ([place])    ???  Arthritis    ??? High cholesterol    ??? Hypertension      Past Surgical History   Procedure Laterality Date   ??? Hx hysterectomy     ??? Hx orthopaedic       epidural pain mtg     History reviewed. No pertinent family history.  Social History   Substance Use Topics   ??? Smoking status: Never Smoker   ??? Smokeless tobacco: Never Used   ??? Alcohol use No        Review of Systems    A comprehensive review of systems was negative except for that written in the HPI.     Objective:     Visit Vitals   ??? BP 110/62 (BP 1 Location: Left arm, BP Patient Position: Sitting)   ??? Pulse 79   ??? Temp 98.2 ??F (36.8 ??C) (Oral)   ??? Resp 16   ??? Ht 5\' 2"  (1.575 m)   ??? Wt 129 lb 3.2 oz (58.6 kg)   ??? SpO2 94%   ??? BMI 23.63 kg/m2     General:  Alert, cooperative, no distress, appears stated age.   Head:  Normocephalic, without obvious abnormality, atraumatic.   Eyes:  Conjunctivae/corneas clear. PERRL, EOMs intact. Fundi benign.   Ears:  Normal TMs and external ear canals both ears.   Nose: Nares normal. Septum midline. Mucosa normal. No drainage or sinus tenderness.    Throat: Lips, mucosa, and tongue normal. Teeth and gums normal.   Neck: Supple, symmetrical, trachea midline, no adenopathy, thyroid: no enlargement/tenderness/nodules, no carotid bruit and no JVD.   Back:   Symmetric, no curvature. ROM normal. No CVA tenderness.   Lungs:   Clear to auscultation bilaterally.   Chest wall:  No tenderness or deformity.   Heart:  Regular rate and rhythm, S1, S2 normal, no murmur, click, rub or gallop.   Abdomen:   Soft, non-tender. Bowel sounds normal. No masses,  No organomegaly.   Extremities: Extremities normal, atraumatic, no cyanosis or edema.   Pulses: 2+ and symmetric all extremities.   Skin: Skin color, texture, turgor normal. No rashes or lesions.   Lymph nodes: Cervical, supraclavicular, and axillary nodes normal.   Neurologic: CNII-XII intact. Normal strength, sensation and reflexes throughout.       Assessment/Plan:       ICD-10-CM ICD-9-CM    1. Essential hypertension [id number] METABOLIC PANEL, COMPREHENSIVE      URINALYSIS W/ RFLX MICROSCOPIC   2. Hyperlipidemia, unspecified hyperlipidemia type E78.5 272.4 LIPID PANEL   3. PVD (peripheral vascular disease) (HCC) I73.9 443.9    4. Osteoporosis M81.0 733.00 DEXA BONE DENSITY STUDY AXIAL   5. Pain in both lower extremities M79.604 729.5     M79.605     6. Vitamin D deficiency E55.9 268.9 DEXA BONE DENSITY STUDY AXIAL   7. Diabetes mellitus screening Z13.1 V77.1        Follow-up Disposition: Not on File   Advised her to call back or return to office if symptoms worsen/change/persist.  Discussed expected course/resolution/complications of diagnosis in detail with patient.    Medication risks/benefits/costs/interactions/alternatives discussed with patient.  She was given an after visit summary which includes diagnoses, current medications, & vitals.  She expressed understanding with the diagnosis and plan.

## 2015-08-10 NOTE — Progress Notes (Signed)
Room 3    .  Chief Complaint   Patient presents with   ??? Complete Physical     Patient states that when she has episodes of Jerrye Bushy she has pain in the back   ??? Leg Pain     Patient was given info to see Dr. Vonzella Nipple for varicose vein problem by cardiologist     1. Have you been to the ER, urgent care clinic since your last visit?  Hospitalized since your last visit?No    2. Have you seen or consulted any other health care providers outside of the [facility] since your last visit?  Include any pap smears or colon screening. No     Health Maintenance Due   Topic Date Due   ??? DTaP/Tdap/Td series (1 - Tdap) 10/12/1944   ??? ZOSTER VACCINE AGE 72>  10/11/1985   ??? GLAUCOMA SCREENING Q2Y  10/12/1990   ??? MEDICARE YEARLY EXAM  10/12/1990   ??? INFLUENZA AGE 69 TO ADULT  03/07/2015     Patient states she has an advance directives in place

## 2015-08-11 LAB — URINALYSIS W/ RFLX MICROSCOPIC
Bilirubin: NEGATIVE
Blood: NEGATIVE
Glucose: NEGATIVE
Ketone: NEGATIVE
Leukocyte Esterase: NEGATIVE
Nitrites: NEGATIVE
Protein: NEGATIVE
Specific Gravity: 1.009 (ref 1.005–1.030)
Urobilinogen: 0.2 mg/dL (ref 0.2–1.0)
pH (UA): 6 (ref 5.0–7.5)

## 2015-08-11 LAB — METABOLIC PANEL, COMPREHENSIVE
A-G Ratio: 1.6 (ref 1.1–2.5)
ALT (SGPT): 18 IU/L (ref 0–32)
AST (SGOT): 21 IU/L (ref 0–40)
Albumin: 4.1 g/dL (ref 3.5–4.7)
Alk. phosphatase: 74 IU/L (ref 39–117)
BUN/Creatinine ratio: 14 (ref 11–26)
BUN: 10 mg/dL (ref 8–27)
Bilirubin, total: 0.3 mg/dL (ref 0.0–1.2)
CO2: 22 mmol/L (ref 18–29)
Calcium: 9.2 mg/dL (ref 8.7–10.3)
Chloride: 92 mmol/L — ABNORMAL LOW (ref 96–106)
Creatinine: 0.73 mg/dL (ref 0.57–1.00)
GFR est AA: 84 mL/min/{1.73_m2} (ref >59)
GFR est non-AA: 73 mL/min/{1.73_m2} (ref >59)
GLOBULIN, TOTAL: 2.6 g/dL (ref 1.5–4.5)
Glucose: 74 mg/dL (ref 65–99)
Potassium: 3.6 mmol/L (ref 3.5–5.2)
Protein, total: 6.7 g/dL (ref 6.0–8.5)
Sodium: 133 mmol/L — ABNORMAL LOW (ref 134–144)

## 2015-08-11 LAB — LIPID PANEL
Cholesterol, total: 176 mg/dL (ref 100–199)
HDL Cholesterol: 74 mg/dL (ref >39)
LDL, calculated: 90 mg/dL (ref 0–99)
Triglyceride: 60 mg/dL (ref 0–149)
VLDL, calculated: 12 mg/dL (ref 5–40)

## 2015-08-12 NOTE — Progress Notes (Signed)
Per Dr. Cira Servant, pt informed that her labs looked fine. Normal kidney, liver function, and urine. Pt voiced understanding. Glyn Ade, LPN

## 2015-08-12 NOTE — Progress Notes (Signed)
Labs look fine  Normal kidney and liver function  Normal urine

## 2015-08-16 ENCOUNTER — Ambulatory Visit
Admit: 2015-08-16 | Discharge: 2015-08-16 | Payer: MEDICARE | Attending: Interventional Cardiology | Primary: Family Medicine

## 2015-08-16 DIAGNOSIS — M79604 Pain in right leg: Secondary | ICD-10-CM

## 2015-08-16 NOTE — Progress Notes (Signed)
08/16/2015 2:49 PM      Subjective:     Kimberly [name] is a 82 year old female here for vascular consult secondary to varicose veins and leg pain.  Her discomfort has occurred for 2 years and denies progression of discomfort.      1.  Have you ever had vein stripping surgery NO      2.  Have you ever had vein injections? NO      3.  Have you ever had a blood clot? NO      4.  Have you ever had phlebitis? NO                                                                    Does anyone in your family have (or used to have) varicose veins, spider veins, leg ulcers or swollen legs?    Father  no  Mother no  Brother(s) no  Sister(s) no            1.  Do you experience any of the following in your legs?  Aching/pain?     One leg  Both legs  Heaviness?       One leg  Both legs  Tiredness/fatigue?     One leg  Both legs  Itching/burning?    One leg  Both legs  Swollen ankles?     One leg  Both legs  Leg cramps?    One leg  Both legs  Restless legs?    One leg  Both legs  Throbbing?    One leg  Both legs    2.  Have your veins gotten worse in recent months?    no       3.    Do you take any medication for pain (i.e., Advil, Motrin) Epsom Salt Soak      If yes, what medication do you take and how many times/mgs per day? YES    4.  Do you elevate your legs to relieve discomfort?   yes   If yes, how long per day do you elevate and does it provide relief? At bed time or prolonged sitting    5.   Do you exercise?      YES   If yes, what kind of exercise and how often? Walking daily     6.   Do you wear prescription compression stockings?  NO    ??   7.    Do you wear light support hose (i.e., [employer])? NO     8.    Do you have any problem walking?   NO                   9.   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  Have you ever had any test(s) done on your veins?   NO          11.  Were you diagnosed with saphenous vein reflux? NO      Visit Vitals    ??? BP 120/60 (BP 1 Location: Right arm, BP Patient Position: Sitting)   ??? Pulse 86   ???  Resp 18   ??? Ht 5\' 2"  (1.575 m)   ??? Wt 130 lb (59 kg)   ??? SpO2 98%   ??? BMI 23.78 kg/m2       Current Outpatient Prescriptions   Medication Sig   ??? hydroCHLOROthiazide (MICROZIDE) 12.5 mg capsule Take 1 Cap by mouth daily.   ??? cilostazol (PLETAL) 100 mg tablet Take 1 Tab by mouth two (2) times a day. On empty stomach   ??? aspirin 81 mg chewable tablet Take 81 mg by mouth daily.   ??? verapamil ER (CALAN-SR) 180 mg CR tablet Take 1 Tab by mouth nightly.   ??? coenzyme q10-vitamin e (COQ10 SG 100) 100-100 mg-unit cap Take  by mouth.   ??? simvastatin (ZOCOR) 10 mg tablet Take 1 Tab by mouth nightly.   ??? ezetimibe (ZETIA) 10 mg tablet Take 1 Tab by mouth daily.   ??? omeprazole (PRILOSEC) 40 mg capsule Take 1 Cap by mouth daily.   ??? ALENDRONATE SODIUM (ALENDRONATE PO) Take 70 mg by mouth. 1 tablet weekly on an empty stomach    ??? ascorbic acid (VITAMIN C) 1,000 mg tablet Take  by mouth.   ??? isosorbide dinitrate (ISORDIL) 10 mg tablet Take  by mouth two (2) times a day.   ??? VITAMIN E MIXED (NATURAL VITAMIN E PO) Take 400 Units by mouth. 3 x week    ??? OXYBUTYNIN CHLORIDE PO Take 5 mg by mouth two (2) times a day.   ??? vitamin A 8,000 unit capsule Take 8,000 Units by mouth daily.     No current facility-administered medications for this visit.          Objective:      Visit Vitals   ??? BP 120/60 (BP 1 Location: Right arm, BP Patient Position: Sitting)   ??? Pulse 86   ??? Resp 18   ??? Ht 5\' 2"  (1.575 m)   ??? Wt 130 lb (59 kg)   ??? SpO2 98%   ??? BMI 23.78 kg/m2       Data Review:     Reviewed and/or ordered active problem list, medication list tests    Past Medical History   Diagnosis Date   ??? Anginal pain ([place])    ??? Arthritis    ??? High cholesterol    ??? Hypertension       Past Surgical History   Procedure Laterality Date   ??? Hx hysterectomy     ??? Hx orthopaedic       epidural pain mtg     Allergies   Allergen Reactions    ??? Pcn [Penicillins] Unknown (comments)   ??? Sulfa (Sulfonamide Antibiotics) Unknown (comments)      No family history on file.   Social History     Social History   ??? Marital status: WIDOWED     Spouse name: N/A   ??? Number of children: N/A   ??? Years of education: N/A     Occupational History   ??? Not on file.     Social History Main Topics   ??? Smoking status: Never Smoker   ??? Smokeless tobacco: Never Used   ??? Alcohol use No   ??? Drug use: No   ??? Sexual activity: Not on file     Other Topics Concern   ??? Not on file     Social History Narrative       Review of Systems??    General:??Not Present- Anorexia, Chills, Dietary Changes, Fatigue, Fever, Medication  Changes, Night Sweats, Weight Gain > 10lbs. and Weight Loss > 10lbs..  Skin:??Not Present- Bruising and Excessive Sweating.  HEENT:??Not Present- Headache, Visual Loss and Vertigo.  Respiratory:??Not Present- Cough, Decreased Exercise Tolerance, Difficulty Breathing, Snoring and Wheezing.  Cardiovascular:??Not Present- Abnormal Blood Pressure, Chest Pain, Difficulty Breathing On Exertion, Edema, Fainting / Blacking Out, Irregular Heart Beat, Orthopnea, Palpitations, Paroxysmal Nocturnal Dyspnea, Rapid Heart Rate, Shortness of Breath and Swelling of Extremities.  Gastrointestinal:??Not Present- Black, Tarry Stool, Bloody Stool, Diarrhea, Hematemesis, Rectal Bleeding and Vomiting.  Musculoskeletal:??Bilateral lower leg pain not attributable to exertional activity..  Neurological:??Not Present- Dizziness.  Psychiatric:??Not Present- Depression.  Endocrine:??Not Present- Cold Intolerance, Heat Intolerance and Thyroid Problems.  Hematology:??Not Present- Abnormal Bleeding, Anemia, Blood Clots and Easy Bruising.    ??  Physical Exam??  The physical exam findings are as follows:    ??  General??  Mental Status??- Alert. General Appearance??- Not in acute distress.      Chest and Lung Exam??  Inspection:??Accessory muscles??- No use of accessory muscles in breathing.  Auscultation:??   Breath sounds:??- Normal.      Cardiovascular??  Inspection:??Jugular vein??- Bilateral??- Inspection Normal.  Palpation/Percussion:??  Apical Impulse:??- Normal.  Auscultation:??Rhythm??- Regular. Heart Sounds??- S1 WNL and S2 WNL. No S3 or S4.  Murmurs & Other Heart Sounds:??Auscultation of the heart reveals - No Murmurs.  Carotid arteries??- No Carotid bruit.      Peripheral Vascular??  Upper Extremity:??Inspection??- Bilateral??- No Cyanotic nailbeds or Digital clubbing.  Lower Extremity:??  Palpation:??  Femoral pulse - Rt.   normal   weak   non palpable     Lt.    normal   weak   non palpable                             Popliteal pulse - Rt.   normal   weak   non palpable     Lt.    normal   weak   non palpable       Dorsalis pedis pulse??- Rt.   normal   weak   non palpable     Lt.    normal   weak   non palpable    Posterior tibia pulse - Rt.   normal   weak   non palpable     Lt.    normal   weak   non palpable                                             Edema:       Rt. Leg               Lt. Leg    Telangiectasia & spider veins: Rt. Leg               Lt. Leg    Varicose veins:   Rt. Leg               Lt. Leg     Skin pigmentation:   Rt. Leg               Lt. Leg     Healed venous ulcer:   Rt. Leg               Lt. Leg  Assessment:       ICD-10-CM ICD-9-CM    1. Pain in both lower extremities M79.604 729.5     M79.605     2. Varicose vein of leg I83.90 454.9        Plan:   Patient is a 82 year old female with a two-year history of bilateral lower leg pain affecting lateral aspect of leg distal to the knee; not related to exertional activity nor prolonged standing.  She does have bilateral superficial varicosities will evaluate with venous reflux.  No further workup is indicated or follow-up needed if venous reflux is normal.  The differential diagnosis is bilateral osteoarthritis with right greater than left.    Berline Chough, NP  08/16/2015  2:49 PM       Pt seen and examined in details. Agree with NP A&P. D/w pt. Further recommendations based upon test results.     Angelique Holm, MD

## 2015-08-16 NOTE — Progress Notes (Signed)
Vascular consult. C/O PAIN IN BLE.

## 2015-08-22 ENCOUNTER — Ambulatory Visit: Payer: MEDICARE | Primary: Family Medicine

## 2015-08-29 ENCOUNTER — Inpatient Hospital Stay: Admit: 2015-08-29 | Payer: MEDICARE | Attending: Family Medicine | Primary: Family Medicine

## 2015-08-29 ENCOUNTER — Encounter: Primary: Family Medicine

## 2015-08-29 DIAGNOSIS — Z780 Asymptomatic menopausal state: Secondary | ICD-10-CM

## 2015-08-29 NOTE — Progress Notes (Signed)
Message left for Kimberly [name] niece to return call in response to whether patient is taking Fosamax which is on her medication list. Patient has Osteoporosis .

## 2015-08-29 NOTE — Progress Notes (Signed)
Spoke to patient's niece who states that patient is not taking fosamax. Advised of results of Bone density test.

## 2015-08-29 NOTE — Progress Notes (Signed)
Please call patient's niece who attends the appointments with her.  Mrs. Allegra has osteoporosis. I see Fosamax on her med list. Is she taking it? Please let me know.

## 2015-08-30 ENCOUNTER — Ambulatory Visit: Payer: MEDICARE | Primary: Family Medicine

## 2015-08-31 ENCOUNTER — Encounter: Primary: Family Medicine

## 2015-09-08 NOTE — Telephone Encounter (Signed)
Spoke to patient's niece [name]) and advised that patient has osteoporosis. Inquired if patient was currently taking Fosamax and answered No.  Will a script be sent to pharmacy.? Please advise

## 2015-09-09 MED ORDER — ALENDRONATE 70 MG TAB
70 mg | ORAL_TABLET | ORAL | 6 refills | Status: DC
Start: 2015-09-09 — End: 2015-09-09

## 2015-09-11 MED ORDER — ALENDRONATE 70 MG TAB
70 mg | ORAL_TABLET | ORAL | 6 refills | Status: DC
Start: 2015-09-11 — End: 2017-07-22

## 2015-09-12 ENCOUNTER — Ambulatory Visit: Admit: 2015-09-12 | Discharge: 2015-09-12 | Payer: MEDICARE | Primary: Family Medicine

## 2015-09-12 DIAGNOSIS — M79604 Pain in right leg: Secondary | ICD-10-CM

## 2015-09-12 NOTE — Progress Notes (Signed)
The procedure was explained to the patient, the patient expresses understanding of what will happen, and agrees to the procedure.

## 2015-09-12 NOTE — Telephone Encounter (Signed)
Ms. Kimberly [name] is returning nurse's call. [phone]

## 2015-09-14 NOTE — Telephone Encounter (Signed)
Spoke to patient's niece [name]) who stated that the fosamax was picked up from [facility]

## 2015-09-14 NOTE — Telephone Encounter (Addendum)
-----   Message from Angelique Holm, MD sent at 09/13/2015 12:34 PM EST -----  Inform pt that LE venous doppler is ok      Left message to call me back.    Spoke to [employer] on hippa form, told her that pt's le venous doppler came back okay. She verbalized that she understood everything.

## 2015-09-14 NOTE — Telephone Encounter (Signed)
Reinerated to [place] that the medication was to be taken once every 7 days. She verbalized understanding and would check with patient to make sure it is being taken as prescribed.

## 2015-09-16 ENCOUNTER — Encounter

## 2015-09-19 ENCOUNTER — Encounter

## 2015-09-22 NOTE — Telephone Encounter (Signed)
Prescription sent.

## 2015-09-23 ENCOUNTER — Inpatient Hospital Stay: Payer: MEDICARE | Attending: Body Imaging | Primary: Family Medicine

## 2015-09-28 DIAGNOSIS — R1310 Dysphagia, unspecified: Secondary | ICD-10-CM

## 2015-09-29 ENCOUNTER — Inpatient Hospital Stay: Payer: MEDICARE | Attending: Diagnostic Radiology | Primary: Family Medicine

## 2015-09-29 NOTE — Telephone Encounter (Signed)
[phone]  Patient's niece Marlou Porch) is calling to speak with Dr. Cira Servant or her nurse regarding questions she has about a recently prescribed medication (alendronate).

## 2015-09-29 NOTE — Telephone Encounter (Signed)
Spoke with pt's niece she reports that her aunt was speaking with a friend and was told she was put on fosamax for bone cancer.Pt became concerned since she does not have bone cancer.  I explained to pt's niece that it is prescribed for osteoporosis. Pt's niece voiced an understanding. [name], LPN

## 2015-09-30 ENCOUNTER — Inpatient Hospital Stay: Admit: 2015-09-30 | Payer: MEDICARE | Attending: Gastroenterology | Primary: Family Medicine

## 2015-09-30 DIAGNOSIS — R1310 Dysphagia, unspecified: Secondary | ICD-10-CM

## 2015-09-30 NOTE — Progress Notes (Signed)
[place]  [address]    Speech Pathology Modified barium swallow Study with cms g codes  Patient: Kimberly [name] [id number] y.o. female)  Date: 09/30/2015  Referring Provider:  Dr Glennon Mac    SUBJECTIVE:   Patient appears in good health. She reports no symptoms of dysphagia and is curious as to why this test was ordered, but happy to proceed.     OBJECTIVE:   Past Medical History:    Past Medical History:   Diagnosis Date   ??? Anginal pain ([place])    ??? Arthritis    ??? High cholesterol    ??? Hypertension      Past Surgical History:   Procedure Laterality Date   ??? HX HYSTERECTOMY     ??? HX ORTHOPAEDIC      epidural pain mtg     Current Dietary Status:  Regular/thins  Radiologist: Dr Dionicio Stall  Film Views: Lateral;Fluoro  Patient Position: upright in TMM chair    Trial 1:   Consistency Presented: Thin liquid;Solid;Pudding   How Presented: Self-fed/presented;Cup/sip;Cup/gulp;Spoon;Straw;Successive swallows       Bolus Acceptance: No impairment   Bolus Formation/Control: No impairment:     Propulsion: No impairment   Oral Residue: None   Initiation of Swallow: No impairment   Timing: No impairment   Penetration: None   Aspiration/Timing: No evidence of aspiration   Pharyngeal Clearance: No residue                       Decreased Tongue Base Retraction?: No  Laryngeal Elevation: WFL (within functional limits)  Aspiration/Penetration Score: 1 (No penetration or aspiration-Contrast does not enter the airway)     Pharyngeal-Esophageal Segment: No impairment  Pharyngeal Dysfunction: None    Oral Phase Severity: No impairment  Pharyngeal Phase Severity: N/A    In compliance with CMS???s Claims Based Outcome Reporting, the following G-code set was chosen for this patient based the use of the NOMS functional outcome to quantify this patient's level of swallowing impairment.    Using the NOMS, the patient was determined to be at level 7 for swallow  function which correlates with the CH= 0% level of severity.    Based on the objective assessment provided within this note, the current, goal, and discharge g-codes are as follows:    Swallow  Swallowing:  G8996 Swallow Current Status CH= 0%  G8997 Swallow Goal Status CH= 0%  G8998 Swallow D/C Status CH= 0%      NOMS Swallowing Levels:  Level 1 (CN): NPO  Level 2 (CM): NPO but takes consistency in therapy  Level 3 (CL): Takes less than 50% of nutrition p.o. and continues with nonoral feedings; and/or safe with mod cues; and/or max diet restriction  Level 4 (CK): Safe swallow but needs mod cues; and/or mod diet restriction; and/or still requires some nonoral feeding/supplements  Level 5 (CJ): Safe swallow with min diet restriction; and/or needs min cues  Level 6 (CI): Independent with p.o.; rare cues; usually self cues; may need to avoid some foods or needs extra time  Level 7 ([place]): Independent for all p.o.  ASHA. (2003). National Outcomes Measurement System (NOMS): Adult Speech-Language Pathology User's Guide.       ASSESSMENT :  Based on the objective data described above, the patient presents with functional oropharyngeal swallow. Prominent, though nonobstructive UES noted.      PLAN/RECOMMENDATIONS :  Diet as tolerated     COMMUNICATION/EDUCATION:  The above findings and recommendations were discussed with: patient who verbalized understanding.    Thank you for this referral.  Etta Quill, SLP  Time Calculation: 15 mins

## 2015-10-02 MED ORDER — HYDROCHLOROTHIAZIDE 12.5 MG CAP
12.5 mg | ORAL_CAPSULE | ORAL | 0 refills | Status: DC
Start: 2015-10-02 — End: 2016-01-18

## 2015-10-02 MED ORDER — CILOSTAZOL 100 MG TAB
100 mg | ORAL_TABLET | ORAL | 0 refills | Status: DC
Start: 2015-10-02 — End: 2015-12-29

## 2015-10-04 ENCOUNTER — Ambulatory Visit: Payer: MEDICARE | Attending: Body Imaging | Primary: Family Medicine

## 2015-10-28 ENCOUNTER — Encounter

## 2015-10-28 MED ORDER — VERAPAMIL ER 180 MG TAB
180 mg | ORAL_TABLET | Freq: Every evening | ORAL | 1 refills | Status: DC
Start: 2015-10-28 — End: 2016-12-09

## 2015-10-28 NOTE — Telephone Encounter (Signed)
Patient needs an emergency supply sent to her pharmacy, due to the mail delivery order not arriving.

## 2015-11-08 ENCOUNTER — Encounter: Attending: Internal Medicine | Primary: Family Medicine

## 2015-11-16 ENCOUNTER — Inpatient Hospital Stay: Admit: 2015-11-16 | Payer: MEDICARE | Attending: Internal Medicine | Primary: Family Medicine

## 2015-11-16 DIAGNOSIS — Z1231 Encounter for screening mammogram for malignant neoplasm of breast: Secondary | ICD-10-CM

## 2015-11-18 ENCOUNTER — Encounter (HOSPITAL_COMMUNITY): Payer: Self-pay | Admitting: *Deleted

## 2015-11-18 ENCOUNTER — Emergency Department (HOSPITAL_COMMUNITY): Payer: Medicare Other

## 2015-11-18 ENCOUNTER — Emergency Department (HOSPITAL_COMMUNITY)
Admission: EM | Admit: 2015-11-18 | Discharge: 2015-11-18 | Disposition: A | Discharge status: Home / Self Care | Payer: Medicare Other | Attending: Emergency Medicine | Admitting: Emergency Medicine

## 2015-11-18 DIAGNOSIS — K219 Gastro-esophageal reflux disease without esophagitis: Secondary | ICD-10-CM | POA: Diagnosis not present

## 2015-11-18 DIAGNOSIS — I209 Angina pectoris, unspecified: Secondary | ICD-10-CM | POA: Diagnosis not present

## 2015-11-18 DIAGNOSIS — E782 Mixed hyperlipidemia: Secondary | ICD-10-CM | POA: Diagnosis not present

## 2015-11-18 DIAGNOSIS — D432 Neoplasm of uncertain behavior of brain, unspecified: Secondary | ICD-10-CM | POA: Diagnosis not present

## 2015-11-18 DIAGNOSIS — Z880 Allergy status to penicillin: Secondary | ICD-10-CM | POA: Insufficient documentation

## 2015-11-18 DIAGNOSIS — M1990 Unspecified osteoarthritis, unspecified site: Secondary | ICD-10-CM | POA: Diagnosis not present

## 2015-11-18 DIAGNOSIS — R55 Syncope and collapse: Secondary | ICD-10-CM | POA: Diagnosis present

## 2015-11-18 DIAGNOSIS — D496 Neoplasm of unspecified behavior of brain: Secondary | ICD-10-CM

## 2015-11-18 DIAGNOSIS — Z79899 Other long term (current) drug therapy: Secondary | ICD-10-CM | POA: Diagnosis not present

## 2015-11-18 HISTORY — DX: Pure hypercholesterolemia, unspecified: E78.00

## 2015-11-18 HISTORY — DX: Other forms of angina pectoris: I20.8

## 2015-11-18 HISTORY — DX: Unspecified osteoarthritis, unspecified site: M19.90

## 2015-11-18 HISTORY — DX: Gastro-esophageal reflux disease without esophagitis: K21.9

## 2015-11-18 HISTORY — DX: Unspecified atherosclerosis: I70.90

## 2015-11-18 LAB — CBG MONITORING, ED: Glucose-Capillary: 90 mg/dL (ref 65–99)

## 2015-11-18 LAB — BASIC METABOLIC PANEL
Anion gap: 12 (ref 5–15)
BUN: 16 mg/dL (ref 6–20)
CALCIUM: 9.3 mg/dL (ref 8.9–10.3)
CO2: 21 mmol/L — ABNORMAL LOW (ref 22–32)
CREATININE: 0.78 mg/dL (ref 0.44–1.00)
Chloride: 102 mmol/L (ref 101–111)
GFR calc Af Amer: >60 mL/min (ref >60)
Glucose, Bld: 89 mg/dL (ref 65–99)
POTASSIUM: 4 mmol/L (ref 3.5–5.1)
SODIUM: 135 mmol/L (ref 135–145)

## 2015-11-18 LAB — URINALYSIS, ROUTINE W REFLEX MICROSCOPIC
Bilirubin Urine: NEGATIVE
Glucose, UA: NEGATIVE mg/dL
Hgb urine dipstick: NEGATIVE
KETONES UR: NEGATIVE mg/dL
NITRITE: NEGATIVE
PROTEIN: NEGATIVE mg/dL
Specific Gravity, Urine: 1.013 (ref 1.005–1.030)
pH: 6.5 (ref 5.0–8.0)

## 2015-11-18 LAB — CBC
HCT: 34.5 % — ABNORMAL LOW (ref 36.0–46.0)
Hemoglobin: 11.4 g/dL — ABNORMAL LOW (ref 12.0–15.0)
MCH: 23.4 pg — ABNORMAL LOW (ref 26.0–34.0)
MCHC: 33 g/dL (ref 30.0–36.0)
MCV: 70.8 fL — ABNORMAL LOW (ref 78.0–100.0)
PLATELETS: 255 10*3/uL (ref 150–400)
RBC: 4.87 MIL/uL (ref 3.87–5.11)
RDW: 14.1 % (ref 11.5–15.5)
WBC: 4.9 10*3/uL (ref 4.0–10.5)

## 2015-11-18 LAB — URINE MICROSCOPIC-ADD ON: RBC / HPF: NONE SEEN RBC/hpf (ref 0–5)

## 2015-11-18 LAB — I-STAT TROPONIN, ED: TROPONIN I, POC: 0.01 ng/mL (ref 0.00–0.08)

## 2015-11-18 NOTE — ED Notes (Signed)
Pt departed in NAD.  

## 2015-11-18 NOTE — ED Notes (Signed)
Patient transported to CT 

## 2015-11-18 NOTE — ED Provider Notes (Signed)
CSN: [id number]     Arrival date & time 11/18/15  1644 History   First MD Initiated Contact with Patient 11/18/15 1823     Chief Complaint  Patient presents with  . Near Syncope     (Consider location/radiation/quality/duration/timing/severity/associated sxs/prior Treatment) HPI Erica Hunter is a 82 y.o. female presents to ED with complaint of possible near syncopal episode.  Patient states she was shopping today. She states she has been doing a lot of leaning forward and picking things up.  States she was up in line to pay for her purchases when she suddenly felt "rush of hot air coming up to my face." a friend that was with her states that patient "had a weird look on her face " and when she asked her if everything was all right, patient was "speaking gibberish." patient was helped down to a chair, at which time just a few seconds later, she was able to answer all her questions appropriately and her symptoms have resolved. Patient denies feeling lightheaded or dizzy during this episode. She denies any chest pain or shortness of breath. No headache. She reports prior syncopal episode about 10 years ago. She is visiting here from out of town, reports she is taking all of her medications and has not missed any doses. Denies any known cardiac history, denies any history of strokes.  Curretnly asymptomatic.   Past Medical History  Diagnosis Date  . Angina at rest [facility])   . Arteriolosclerosis   . GERD (gastroesophageal reflux disease)   . High cholesterol   . Arthritis    History reviewed. No pertinent past surgical history. History reviewed. No pertinent family history. Social History  Substance Use Topics  . Smoking status: Never Smoker   . Smokeless tobacco: None  . Alcohol Use: No   OB History    No data available     Review of Systems  Constitutional: Negative for fever and chills.  Respiratory: Negative for cough, chest tightness and shortness of breath.   Cardiovascular:  Negative for chest pain, palpitations and leg swelling.  Gastrointestinal: Negative for nausea, vomiting, abdominal pain and diarrhea.  Genitourinary: Negative for dysuria, flank pain and pelvic pain.  Musculoskeletal: Negative for myalgias, arthralgias, neck pain and neck stiffness.  Skin: Negative for rash.  Neurological: Negative for dizziness, weakness and headaches.  All other systems reviewed and are negative.     Allergies  Copper-containing compounds and Penicillins  Home Medications   Prior to Admission medications   Medication Sig Start Date End Date Taking? Authorizing Provider  alendronate (FOSAMAX) 70 MG tablet Take 70 mg by mouth every Monday. 10/29/15  Yes Historical Provider, MD  cilostazol (PLETAL) 100 MG tablet Take 100 mg by mouth 2 (two) times daily. 10/02/15  Yes Historical Provider, MD  ezetimibe (ZETIA) 10 MG tablet Take 10 mg by mouth every evening.   Yes Historical Provider, MD  hydrochlorothiazide (MICROZIDE) 12.5 MG capsule Take 12.5 mg by mouth daily. 09/27/15  Yes Historical Provider, MD  nystatin-triamcinolone ointment (MYCOLOG) Apply 1 application topically daily as needed (for back).  10/25/15  Yes Historical Provider, MD  omeprazole (PRILOSEC) 40 MG capsule Take 40 mg by mouth daily. 10/25/15  Yes Historical Provider, MD  polyethylene glycol powder (GLYCOLAX/MIRALAX) powder Take 17 g by mouth daily. 09/15/15  Yes Historical Provider, MD  simvastatin (ZOCOR) 5 MG tablet Take 5 mg by mouth daily at 6 PM.   Yes Historical Provider, MD  verapamil (CALAN-SR) 180 MG CR tablet Take  180 mg by mouth daily. 10/16/15  Yes Historical Provider, MD   BP 126/71 mmHg  Pulse 75  Temp(Src) 98.8 F (37.1 C) (Oral)  Resp 25  SpO2 97% Physical Exam  Constitutional: She is oriented to person, place, and time. She appears well-developed and well-nourished. No distress.  HENT:  Head: Normocephalic.  Eyes: Conjunctivae and EOM are normal. Pupils are equal, round, and reactive to  light.  Neck: Normal range of motion. Neck supple.  Cardiovascular: Normal rate, regular rhythm and normal heart sounds.   Pulmonary/Chest: Effort normal and breath sounds normal. No respiratory distress. She has no wheezes. She has no rales.  Abdominal: Soft. Bowel sounds are normal. She exhibits no distension. There is no tenderness. There is no rebound.  Musculoskeletal: She exhibits no edema.  Neurological: She is alert and oriented to person, place, and time. No cranial nerve deficit.  5/5 and equal upper and lower extremity strength bilaterally. Equal grip strength bilaterally. Normal finger to nose and heel to shin. No pronator drift.   Skin: Skin is warm and dry.  Psychiatric: She has a normal mood and affect. Her behavior is normal.  Nursing note and vitals reviewed.   ED Course  Procedures (including critical care time) Labs Review Labs Reviewed  BASIC METABOLIC PANEL - Abnormal; Notable for the following:    CO2 21 (*)    All other components within normal limits  CBC - Abnormal; Notable for the following:    Hemoglobin 11.4 (*)    HCT 34.5 (*)    MCV 70.8 (*)    MCH 23.4 (*)    All other components within normal limits  URINALYSIS, ROUTINE W REFLEX MICROSCOPIC (NOT AT [facility])  CBG MONITORING, ED  Randolm Idol, ED    Imaging Review Dg Chest 2 View  11/18/2015  CLINICAL DATA:  Patient with syncopal episode. EXAM: CHEST  2 VIEW COMPARISON:  None. FINDINGS: Normal cardiac and mediastinal contours. Elevation the right hemidiaphragm. No large area of pulmonary consolidation. Biapical pleural parenchymal thickening and emphysematous change. Thoracic spine degenerative changes. Tortuosity of the thoracic aorta. IMPRESSION: No acute cardiopulmonary process. Electronically Signed   By: Lovey Newcomer M.D.   On: 11/18/2015 19:05   I have personally reviewed and evaluated these images and lab results as part of my medical decision-making.   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 Dysarthria for approximately 15 sec. Currently asymptomatic and at baseline. Normal neuro exam. Will check labs, CXR, CT head. Will monitor.     9:41 PM Not orthostatic. Labs normal. Pt with CT finding of a pituitary mass. MRI recommended.  She is currently asymptomatic. Pt does not want to wait for MRI here and I do not think that is what is causing her today's episode. She will be going back home Monday and will follow up with with her primary care doctor.   Filed Vitals:   11/18/15 1700 11/18/15 1822 11/18/15 2039  BP: 121/66 126/71 140/60  Pulse: 87 75 78  Temp: 97.9 F (36.6 C)  98.8 F (37.1 C)   TempSrc: Oral Oral   Resp: 18 25 13   SpO2: 97% 97% 100%     Jeannett Senior, PA-C 11/18/15 Glen White, MD 11/19/15 1414

## 2015-11-18 NOTE — ED Provider Notes (Signed)
  Face-to-face evaluation   History: She presents for evaluation of near syncopal episode, associated with a hot feeling, shortly after bending over to look for "stockings". She had a 15 second period when she could not speak, or recognize her friend, and almost fell. She resolved spontaneously after sitting in a chair. She was able to drink water, and her friend brought her here for evaluation. No prior similar symptoms. She is completely back to baseline at this time.  Physical exam: Alert, elderly female. No dysarthria, aphasia or nystagmus. Normal strength and sensation bilaterally. No pronator drift.  Medical screening examination/treatment/procedure(s) were conducted as a shared visit with non-physician practitioner(s) and myself.  I personally evaluated the patient during the encounter  Daleen Bo, MD 11/19/15 1415

## 2015-11-18 NOTE — ED Notes (Signed)
Pt reports being out shopping and had near syncopal episode. She felt flushed and hot from waist up and became weak. Was able to sit down and feel better after drinking water. Denies any cp or sob.

## 2015-11-18 NOTE — Discharge Instructions (Signed)
Make sure to drink plenty of fluids. Avoid standing up too fast. Follow up with primary care doctor as soon as get home. Make sure to give him all of the records from here attached.    Near-Syncope Near-syncope (commonly known as near fainting) is sudden weakness, dizziness, or feeling like you might pass out. During an episode of near-syncope, you may also develop pale skin, have tunnel vision, or feel sick to your stomach (nauseous). Near-syncope may occur when getting up after sitting or while standing for a long time. It is caused by a sudden decrease in blood flow to the brain. This decrease can result from various causes or triggers, most of which are not serious. However, because near-syncope can sometimes be a sign of something serious, a medical evaluation is required. The specific cause is often not determined. HOME CARE INSTRUCTIONS  Monitor your condition for any changes. The following actions may help to alleviate any discomfort you are experiencing:  Have someone stay with you until you feel stable.  Lie down right away and prop your feet up if you start feeling like you might faint. Breathe deeply and steadily. Wait until all the symptoms have passed. Most of these episodes last only a few minutes. You may feel tired for several hours.   Drink enough fluids to keep your urine clear or pale yellow.   If you are taking blood pressure or heart medicine, get up slowly when seated or lying down. Take several minutes to sit and then stand. This can reduce dizziness.  Follow up with your health care provider as directed. SEEK IMMEDIATE MEDICAL CARE IF:   You have a severe headache.   You have unusual pain in the chest, abdomen, or back.   You are bleeding from the mouth or rectum, or you have black or tarry stool.   You have an irregular or very fast heartbeat.   You have repeated fainting or have seizure-like jerking during an episode.   You faint when sitting or lying  down.   You have confusion.   You have difficulty walking.   You have severe weakness.   You have vision problems.  MAKE SURE YOU:   Understand these instructions.  Will watch your condition.  Will get help right away if you are not doing well or get worse.   This information is not intended to replace advice given to you by your health care provider. Make sure you discuss any questions you have with your health care provider.   Document Released: 07/23/2005 Document Revised: 07/28/2013 Document Reviewed: 12/26/2012 Elsevier Interactive Patient Education [employer].

## 2015-11-22 ENCOUNTER — Ambulatory Visit: Admit: 2015-11-22 | Discharge: 2015-11-22 | Payer: MEDICARE | Attending: Medical | Primary: Family Medicine

## 2015-11-22 DIAGNOSIS — D497 Neoplasm of unspecified behavior of endocrine glands and other parts of nervous system: Secondary | ICD-10-CM

## 2015-11-22 NOTE — Progress Notes (Signed)
HISTORY OF PRESENT ILLNESS  Kimberly [name] is a 82 y.o. female.  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 Pt had been shopping in [place] with a friend. Had been looking at something on a low shelf - when standing up from bending over, felt dizzy. Never lost consciousness and did not fall, but felt "warm" from abdomen to head. Had to sit down and drink water. Friend drove pt to ER. ER did a CT of head - a suspected pituitary tumor (1.9x1.7 cm) was found. ER recommended MRI, but pt declined, as she was asymptomatic and ready to go home.     Pt last had eye exam in March and pt reports that it was normal. She denies any visual disturbances.   Review of Systems   Constitutional: Negative for fever.   Eyes: Negative for blurred vision and double vision.   Musculoskeletal: Negative for falls.   Neurological: Negative for loss of consciousness and headaches.       Physical Exam   Constitutional: She is oriented to person, place, and time. She appears well-developed and well-nourished. No distress.   HENT:   Head: Normocephalic and atraumatic.   Neck: Neck supple. No JVD present.   Pulmonary/Chest: Effort normal and breath sounds normal. No respiratory distress.   Musculoskeletal: She exhibits no edema.   Neurological: She is alert and oriented to person, place, and time. Coordination normal.   Skin: Skin is warm and dry.   Psychiatric: She has a normal mood and affect. Her behavior is normal. Judgment and thought content normal.   Nursing note and vitals reviewed.      ASSESSMENT and PLAN    ICD-10-CM ICD-9-CM    1. Pituitary tumor D49.7 239.7 REFERRAL TO NEUROLOGY    Discussed results and option of my ordering MRI vs first consulting with Neurology. Pt elects to first consult with neurology.

## 2015-11-22 NOTE — Progress Notes (Signed)
Room 1    Chief Complaint   Patient presents with   ??? Abnormal Lab Results     Patient and niece desires to discuss results of CT scan     1. Have you been to the ER, urgent care clinic since your last visit?  Hospitalized since your last visit?Yes Reason for visit: To [facility] in [place] on 11/18/2015    2. Have you seen or consulted any other health care providers outside of the [facility] since your last visit?  Include any pap smears or colon screening. Yes Where: [facility]     Health Maintenance Due   Topic Date Due   ??? DTaP/Tdap/Td series (1 - Tdap) 10/12/1944   ??? ZOSTER VACCINE AGE 31>  10/11/1985   ??? GLAUCOMA SCREENING Q2Y  10/12/1990   ??? MEDICARE YEARLY EXAM  10/12/1990   ??? INFLUENZA AGE 101 TO ADULT  03/07/2015   ??? Pneumococcal 65+ Low/Medium Risk (2 of 2 - PCV13) 08/19/2015

## 2015-11-24 ENCOUNTER — Encounter

## 2015-11-24 NOTE — Telephone Encounter (Signed)
RX request for Oxybutin 5mg 

## 2015-11-28 MED ORDER — OXYBUTYNIN CHLORIDE 5 MG TAB
5 mg | ORAL_TABLET | Freq: Two times a day (BID) | ORAL | 3 refills | Status: DC
Start: 2015-11-28 — End: 2015-11-28

## 2015-11-28 MED ORDER — OXYBUTYNIN CHLORIDE 5 MG TAB
5 mg | ORAL_TABLET | ORAL | 3 refills | Status: DC
Start: 2015-11-28 — End: 2017-11-13

## 2015-11-30 ENCOUNTER — Encounter: Attending: Neurology | Primary: Family Medicine

## 2015-11-30 ENCOUNTER — Encounter: Attending: Family | Primary: Family Medicine

## 2015-12-01 ENCOUNTER — Ambulatory Visit: Admit: 2015-12-01 | Discharge: 2015-12-01 | Payer: MEDICARE | Attending: Neurology | Primary: Family Medicine

## 2015-12-01 DIAGNOSIS — D352 Benign neoplasm of pituitary gland: Secondary | ICD-10-CM

## 2015-12-01 NOTE — Progress Notes (Signed)
Chief Complaint   Patient presents with   ??? Other     Tumor on Pituitary gland   ??? New Patient

## 2015-12-01 NOTE — Progress Notes (Signed)
Neurology Consult Note      HISTORY PROVIDED KG:MWNUU Complaint: Patient     Chief Complaint   Patient presents with   ??? Other     Tumor on Pituitary gland   ??? 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.According to patient, she went to visit a friend in [place], while in a store , she felt dizzy and apparently blacked out.Patient was taken to ER,CT scan of the head was done and lesion was said to be found in the brain and patient was subsequently asked to follow up with a Neurologist.On further questioning, patient noted that she had had similar incident previously.Had some falls and memory not as good.She denies chadnge in smell or taste,difficulty swallowing,nausea or vomiting.Admits occasional headache, dizziness,neck and back pain,Admits joint pain,numbness and tingling sensation.Denies double or blurry vision.Admits decreased hearing and dizziness, occasional vertigo.    Past Medical History:   Diagnosis Date   ??? Anginal pain ([place])    ??? Arthritis    ??? Constipation    ??? Hearing loss    ??? Hearing loss    ??? High cholesterol    ??? Hypertension    ??? Incontinence    ??? Joint pain    ??? Memory disorder    ??? Muscle pain    ??? Ringing in the ears    ??? Snoring    ??? Visual disturbance       Past Surgical History:   Procedure Laterality Date   ??? HX HYSTERECTOMY     ??? HX ORTHOPAEDIC      epidural pain mtg      Social History     Social History   ??? Marital status: WIDOWED     Spouse name: N/A   ??? Number of children: N/A   ??? Years of education: N/A     Occupational History   ??? Not on file.     Social History Main Topics   ??? Smoking status: Never Smoker   ??? Smokeless tobacco: Never Used   ??? Alcohol use No   ??? Drug use: No   ??? Sexual activity: No     Other Topics Concern   ??? Not on file     Social History Narrative      Family History   Problem Relation Age of Onset   ??? Breast Cancer Mother 38   ??? Breast Cancer Sister 48   ??? Breast Cancer Daughter 36   ??? Dementia Brother    ??? Dementia Sister          Objective:   ROS  12 systems reviewed  unremarkable except as per HPI  Allergies   Allergen Reactions   ??? Pcn [Penicillins] Unknown (comments)   ??? Sulfa (Sulfonamide Antibiotics) Unknown (comments)        Meds:  Outpatient Medications Prior to Visit   Medication Sig Dispense Refill   ??? oxybutynin (DITROPAN) 5 mg tablet TAKE 1 TABLET BY MOUTH TWICE DAILY 180 Tab 3   ??? simvastatin (ZOCOR) 5 mg tablet Take  by mouth nightly.     ??? polyethylene glycol (MIRALAX) 17 gram/dose powder TAKE 1 CAPFUL PO ONCE A DAY  11   ??? nystatin-triamcinolone (MYCOLOG) 100,000-0.1 unit/gram-% ointment APP AA BID PRN  0   ???  verapamil ER (CALAN-SR) 180 mg CR tablet Take 1 Tab by mouth nightly. 90 Tab 1   ??? cilostazol (PLETAL) 100 mg tablet TAKE 1 TABLET BY MOUTH TWICE DAILY ON EMPTY STOMACH 180 Tab 0   ??? hydroCHLOROthiazide (MICROZIDE) 12.5 mg capsule TAKE 1 CAPSULE BY MOUTH DAILY 90 Cap 0   ??? alendronate (FOSAMAX) 70 mg tablet TAKE 1 TABLET BY MOUTH EVERY 7 DAYS 12 Tab 6   ??? aspirin 81 mg chewable tablet Take 81 mg by mouth daily.     ??? coenzyme q10-vitamin e (COQ10 SG 100) 100-100 mg-unit cap Take  by mouth.     ??? ezetimibe (ZETIA) 10 mg tablet Take 1 Tab by mouth daily. 90 Tab 1   ??? omeprazole (PRILOSEC) 40 mg capsule Take 1 Cap by mouth daily. 30 Cap 0   ??? ascorbic acid (VITAMIN C) 1,000 mg tablet Take  by mouth.     ??? isosorbide dinitrate (ISORDIL) 10 mg tablet Take  by mouth two (2) times a day.     ??? VITAMIN E MIXED (NATURAL VITAMIN E PO) Take 400 Units by mouth. 3 x week      ??? vitamin A 8,000 unit capsule Take 8,000 Units by mouth daily.       No facility-administered medications prior to visit.        Imaging:  MRI Results (most recent):    Results from Hospital Encounter encounter on 07/07/11   MRI LUMB SPINE WO CONT   Narrative **Final Report**       ICD Codes / Adm.Diagnosis: 724.3  738.4 / Sciatica  Acquired   spondylolisthesis  Examination:  MR L SPINE WO CON  - [id number] - Jul 07 2011  2:13PM  Accession No:  [id number]  Reason:  SCIATICA      REPORT:  EXAM:  MR L SPINE WO CON    INDICATION: Low back and bilateral lower extremity pain    COMPARISON: Lumbar spine plain films on April 11, 2010    TECHNIQUE: Sagittal T1, T2, STIR and axial T1 and T2 noncontrast MRI of the   lumbar spine .    FINDINGS: Grade 1 anterolisthesis of L3-4 and L4-5 is unchanged.  Severe   facet arthrosis at these levels.  No definitive spondylolysis.  No new   subluxation.    Degenerative bone marrow signal is greatest at L4-5.  No marrow infiltrative   process. No compression deformity. Disc desiccation and vacuum disc   phenomenon are severe at L3-4 and L4-5.    Conus medullaris terminates at L1. Signal and caliber of the distal cord and   conus are within normal limits. Bilateral renal cystic lesions are partially   imaged..    T12-L1: No herniation or stenosis.    L1-L2: Diffuse disc bulge, facet arthrosis, and thickened ligamentum flavum.   No stenosis.    L2-L3: Diffuse disc bulge, facet arthrosis, and thickened ligamentum flavum.   Minimal central spinal canal stenosis.  Mild left foraminal stenosis.    L3-L4: Uncovered diffuse disc bulge and severe facet arthrosis.  Severe   central spinal canal stenosis.  Severe bilateral foraminal stenosis.    L4-L5: Uncovered diffuse disc bulge, facet arthrosis, and thickened   ligamentum flavum.  Moderate-severe central spinal canal stenosis.    Moderate-severe bilateral foraminal stenosis.    L5-S1: No herniation or stenosis.       IMPRESSION:  1.  L3-4 severe central spinal canal stenosis and bilateral foraminal   stenosis.  2.  L4-5 moderate-severe central spinal canal stenosis and bilateral   foraminal stenosis.  3.  Unchanged grade 1 anterolisthesis of L3-4 and L4-5 secondary to severe   central arthrosis.            Signing/Reading Doctor: Marilynne Drivers [phone])    Approved: Marilynne Drivers [phone])  Jul 09 2011  8:31AM                                     CT Results (most recent):    Results from Hospital Encounter encounter on 07/28/12   CT ABD W CONT   Narrative **Final Report**      ICD Codes / Adm.Diagnosis: 783.21  285.9 / Loss of weight  Anemia,   unspecified  Examination:  CT ABDOMEN W CON  - [id number] - Jul 28 2012 11:34AM  Accession No:  [id number]  Reason:  weight loss      REPORT:  INDICATION:  Weight loss, anemia.    COMPARISON:  05/12/2010 and 11/21/2007.    TECHNIQUE:  Contiguous CT images were obtained of the abdomen with and   delayed 100 mL of Optiray 350 contrast.  Oral contrast was administered as   well.  Coronal and sagittal reformatted images were then obtained.    CT ABDOMEN:  A stable few hepatic hypodensities/cysts the largest at the   lateral dome of the right of the liver measuring 1.2 cm are seen.  A stable   flash filling lesion at the posterior dome of the right lobe of the liver is   seen.  No new hepatic abnormality is appreciated.  The kidneys show   bilateral cysts larger and more numerous on the right. The spleen, pancreas,   and adrenal glands show a satisfactory appearance.  The abdomen aorta is   normal in caliber and shows atherosclerotic mural calcification.  The   gallbladder is normally distended.  The biliary ducts are not dilated.    There is no ascites. The lung bases are clear    Oral contrast in the distal esophagus suggesting delayed clearing or reflux   is present. The visualized small bowel is normal in caliber. The visualized   colon shows fecal stasis.  Grade 1 spondylolisthesis, discogenic disease,   and facet arthropathy at L3-L4 and L4-5 are again seen.       IMPRESSION:  Stable hepatic hypodensities and stable hepatic flash filling   lesion.    Bilateral renal cysts, no hydronephrosis.    Grade 1 spondylolisthesis, discogenic disease, and degenerative change at   L3-L4 and L4-5.                                        Signing/Reading Doctor: Milus Glazier [phone])    Approved: Milus Glazier ([id number])  Jul 28 2012  2:15PM                                 Reviewed records in [facility] and media tab today    Lab Review   Results for orders placed or performed in visit on 59/56/38   METABOLIC PANEL, COMPREHENSIVE   Result Value Ref Range    Glucose 74 65 - 99 mg/dL    BUN 10 8 - 27 mg/dL  Creatinine 0.73 0.57 - 1.00 mg/dL    GFR est non-AA 73 >59 mL/min/1.73    GFR est AA 84 >59 mL/min/1.73    BUN/Creatinine ratio 14 11 - 26    Sodium 133 (L) 134 - 144 mmol/L    Potassium 3.6 3.5 - 5.2 mmol/L    Chloride 92 (L) 96 - 106 mmol/L    CO2 22 18 - 29 mmol/L    Calcium 9.2 8.7 - 10.3 mg/dL    Protein, total 6.7 6.0 - 8.5 g/dL    Albumin 4.1 3.5 - 4.7 g/dL    GLOBULIN, TOTAL 2.6 1.5 - 4.5 g/dL    A-G Ratio 1.6 1.1 - 2.5    Bilirubin, total 0.3 0.0 - 1.2 mg/dL    Alk. phosphatase 74 39 - 117 IU/L    AST (SGOT) 21 0 - 40 IU/L    ALT (SGPT) 18 0 - 32 IU/L   LIPID PANEL   Result Value Ref Range    Cholesterol, total 176 100 - 199 mg/dL    Triglyceride 60 0 - 149 mg/dL    HDL Cholesterol 74 >39 mg/dL    VLDL, calculated 12 5 - 40 mg/dL    LDL, calculated 90 0 - 99 mg/dL   URINALYSIS W/ RFLX MICROSCOPIC   Result Value Ref Range    Specific Gravity 1.009 1.005 - 1.030    pH (UA) 6.0 5.0 - 7.5    Color Yellow Yellow    Appearance Clear Clear    Leukocyte Esterase Negative Negative    Protein Negative Negative/Trace    Glucose Negative Negative    Ketone Negative Negative    Blood Negative Negative    Bilirubin Negative Negative    Urobilinogen 0.2 0.2 - 1.0 mg/dL    Nitrites Negative Negative    Microscopic Examination Comment         Exam:  Visit Vitals   ??? BP 124/60 (BP 1 Location: Left arm, BP Patient Position: Sitting)   ??? Pulse 72   ??? Temp 96.8 ??F (36 ??C) (Oral)   ??? Resp 18   ??? Ht _0  (1.575 m)   ??? Wt 127 lb 3.2 oz (57.7 kg)   ??? SpO2 95%   ??? BMI 23.27 kg/m2      General:  Alert, cooperative, no distress.   Head:  Normocephalic, without obvious abnormality, atraumatic.   Respiratory:  Heart:   Non labored breathing  Regular rate and rhythm, no murmurs   Neck:   2+ carotids, no bruits   Extremities: Warm, no cyanosis or edema.   Pulses: 2+ radial pulses.       Neurologic:  MS: Alert and oriented x 4, speech intact. Language intact, able to name, repeat, and follow all commands. Attention and fund of knowledge appropriate.  Recent and remote memory intact.  Cranial Nerves:  II: visual fields Full to confrontation   II: pupils Equal, round, reactive to light   II: optic disc No papilledema   III,VII: ptosis none   III,IV,VI: extraocular muscles  EOMI, no nystagmus or diplopia   V: facial light touch sensation  normal   VII: facial muscle function   symmetric   VIII: hearing intact   IX: soft palate elevation  normal   XI: trapezius strength  5/5   XI: sternocleidomastoid strength 5/5   XII: tongue  Midline     Motor: normal bulk and tone, no tremor  Strength: 5-/5 throughout, no PD  Sensory: equivocal sensation to LT, PP  Coordination: FTN and HTS intact, RAM intact,Romberg negative  Gait: unsteadyl gait, unable to heel, toe, and tandem walk  Reflexes: 2+ symmetric, toes withdrawn           Assessment/Plan   Pituitary tumor  Paresthesia  Syncope  MCI  Gait disorder    ICD-10-CM ICD-9-CM    1. Pituitary adenoma ([place]) D35.2 227.3 MRI BRAIN W WO CONT   2. Paresthesia R20.2 782.0    3. Gait disorder R26.9 781.2 MRI BRAIN W WO CONT      EEG   4. MCI (mild cognitive impairment) G31.84 331.83 EEG   5. RLS (restless legs syndrome) G25.81 333.94        Follow-up Disposition:  Return in about 4 weeks (around 12/29/2015) for medication adjustment.      Signed:  Nelwyn Salisbury, MD  12/06/2015

## 2015-12-13 ENCOUNTER — Ambulatory Visit: Admit: 2015-12-13 | Discharge: 2015-12-13 | Payer: MEDICARE | Attending: Internal Medicine | Primary: Family Medicine

## 2015-12-13 DIAGNOSIS — Z23 Encounter for immunization: Secondary | ICD-10-CM

## 2015-12-13 MED ORDER — ISOSORBIDE DINITRATE 10 MG TAB
10 mg | ORAL_TABLET | Freq: Two times a day (BID) | ORAL | 1 refills | Status: DC
Start: 2015-12-13 — End: 2015-12-14

## 2015-12-13 MED ORDER — ACETAMINOPHEN 325 MG TABLET
325 mg | ORAL_TABLET | Freq: Three times a day (TID) | ORAL | 3 refills | Status: DC
Start: 2015-12-13 — End: 2016-05-01

## 2015-12-13 NOTE — Progress Notes (Signed)
Chief Complaint   Patient presents with   ??? Annual Wellness Visit     complains of right leg pain.      1. Have you been to the ER, urgent care clinic since your last visit?  Hospitalized since your last visit?No    2. Have you seen or consulted any other health care providers outside of the [facility] since your last visit?  Include any pap smears or colon screening. No    Kimberly [name]  is a 82 y.o.  female  who present for tdap immunizations/injections.   He/she denies any symptoms , reactions or allergies that would exclude them from being immunized today.  Risks and adverse reactions were discussed and the VIS was given if applicable to them. All questions were addressed.  He/She was observed for 5 min post injection. There were no reactions observed.    [name], LPN

## 2015-12-13 NOTE — Progress Notes (Signed)
Subjective:      Kimberly [name] is a 82 y.o. female who presents today for   Chief Complaint   Patient presents with   ??? Annual Wellness Visit     complains of right leg pain.      Hypertension  Hyperlipidemia  PVD  Right leg pain- takes Tylenol Arthritis for pain. She also uses heat on her leg.. She says pain is 5/10. She has undergone testing per her cardiologist checked dopplers with non significant findings.  Has been diagnosed with pituitary mass- work up is pending per neurology. Apparently patient was out of town and had a "spell" she was seen at ER and head CT showed suspicious lesion. MRI as well as EEG have been ordered by neuro Dr. Verdia Kuba.  Laceration hand- patient has small laceration on hand sustained several hours ago when cut hand on metal object  ??  Flu and pneumovax are up to date. She thinks she had the zostavax  ??  Health Screening Update  DEXA in 2013 showed osteoporosis- advised calcium and vit D. Will get updated study ordered  Mammogram due spring 2017  Pap smears have been discontinued due to hysterectomy  Colonoscopy     Patient Active Problem List    Diagnosis Date Noted   ??? 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 Memory loss 11/21/2014   ??? Acute bacterial conjunctivitis of right eye 11/21/2014   ??? SOB (shortness of breath) on exertion 06/21/2014   ??? Chest pain with normal angiography--~ 15 yrs ago MCV 06/21/2014   ??? Hypertension, essential, benign    ??? High cholesterol      Current Outpatient Prescriptions   Medication Sig Dispense Refill   ??? oxybutynin (DITROPAN) 5 mg tablet TAKE 1 TABLET BY MOUTH TWICE DAILY 180 Tab 3   ??? simvastatin (ZOCOR) 5 mg tablet Take  by mouth nightly.     ??? polyethylene glycol (MIRALAX) 17 gram/dose powder TAKE 1 CAPFUL PO ONCE A DAY  11   ??? nystatin-triamcinolone (MYCOLOG) 100,000-0.1 unit/gram-% ointment APP AA BID PRN  0   ??? verapamil ER (CALAN-SR) 180 mg CR tablet Take 1 Tab by mouth nightly. 90 Tab 1    ??? cilostazol (PLETAL) 100 mg tablet TAKE 1 TABLET BY MOUTH TWICE DAILY ON EMPTY STOMACH 180 Tab 0   ??? hydroCHLOROthiazide (MICROZIDE) 12.5 mg capsule TAKE 1 CAPSULE BY MOUTH DAILY 90 Cap 0   ??? alendronate (FOSAMAX) 70 mg tablet TAKE 1 TABLET BY MOUTH EVERY 7 DAYS 12 Tab 6   ??? aspirin 81 mg chewable tablet Take 81 mg by mouth daily.     ??? coenzyme q10-vitamin e (COQ10 SG 100) 100-100 mg-unit cap Take  by mouth.     ??? ezetimibe (ZETIA) 10 mg tablet Take 1 Tab by mouth daily. 90 Tab 1   ??? omeprazole (PRILOSEC) 40 mg capsule Take 1 Cap by mouth daily. 30 Cap 0   ??? ascorbic acid (VITAMIN C) 1,000 mg tablet Take  by mouth.     ??? isosorbide dinitrate (ISORDIL) 10 mg tablet Take  by mouth two (2) times a day.     ??? VITAMIN E MIXED (NATURAL VITAMIN E PO) Take 400 Units by mouth. 3 x week      ??? vitamin A 8,000 unit capsule Take 8,000 Units by mouth daily.       Allergies   Allergen Reactions   ??? Pcn [Penicillins] Unknown (comments)   ??? Sulfa (Sulfonamide Antibiotics) Unknown (comments)  Past Medical History:   Diagnosis Date   ??? Anginal pain ([place])    ??? Arthritis    ??? Constipation    ??? Hearing loss    ??? Hearing loss    ??? High cholesterol    ??? Hypertension    ??? Incontinence    ??? Joint pain    ??? Memory disorder    ??? Muscle pain    ??? Ringing in the ears    ??? Snoring    ??? Visual disturbance      Past Surgical History:   Procedure Laterality Date   ??? HX HYSTERECTOMY     ??? HX ORTHOPAEDIC      epidural pain mtg     Family History   Problem Relation Age of Onset   ??? Breast Cancer Mother 63   ??? Breast Cancer Sister 41   ??? Breast Cancer Daughter 23   ??? Dementia Brother    ??? Dementia Sister      Social History   Substance Use Topics   ??? Smoking status: Never Smoker   ??? Smokeless tobacco: Never Used   ??? Alcohol use No        Review of Systems    A comprehensive review of systems was negative except for that written in the HPI.     Objective:     Visit Vitals   ??? BP 127/62 (BP 1 Location: Left arm, BP Patient Position: Sitting)    ??? Pulse 84   ??? Temp 97.4 ??F (36.3 ??C) (Oral)   ??? Resp 18   ??? Ht 5\' 2"  (1.575 m)   ??? Wt 128 lb (58.1 kg)   ??? BMI 23.41 kg/m2     General:  Alert, cooperative, no distress, appears stated age.   Head:  Normocephalic, without obvious abnormality, atraumatic.   Eyes:  Conjunctivae/corneas clear. PERRL, EOMs intact. Fundi benign.   Ears:  Normal TMs and external ear canals both ears.   Nose: Nares normal. Septum midline. Mucosa normal. No drainage or sinus tenderness.   Throat: Lips, mucosa, and tongue normal. Teeth and gums normal.   Neck: Supple, symmetrical, trachea midline, no adenopathy, thyroid: no enlargement/tenderness/nodules, no carotid bruit and no JVD.   Back:   Symmetric, no curvature. ROM normal. No CVA tenderness.   Lungs:   Clear to auscultation bilaterally.   Chest wall:  No tenderness or deformity.   Heart:  Regular rate and rhythm, S1, S2 normal, no murmur, click, rub or gallop.   Abdomen:   Soft, non-tender. Bowel sounds normal. No masses,  No organomegaly.   Extremities: Extremities normal, atraumatic, no cyanosis or edema.   Pulses: 2+ and symmetric all extremities.   Skin: Skin color, texture, turgor normal. No rashes or lesions.   Lymph nodes: Cervical, supraclavicular, and axillary nodes normal.   Neurologic: CNII-XII intact. Normal strength, sensation and reflexes throughout.       Assessment/Plan:       ICD-10-CM ICD-9-CM    1. Encounter for immunization Z23 V03.89 TETANUS, DIPHTHERIA TOXOIDS AND ACELLULAR PERTUSSIS VACCINE (TDAP), IN INDIVIDS. >=7, IM   2. Routine general medical examination at a health care facility 123456 [id number] METABOLIC PANEL, COMPREHENSIVE   3. Screening for alcoholism Z13.89 V79.1    4. Essential hypertension [id number] METABOLIC PANEL, COMPREHENSIVE   5. Pituitary tumor [id number] METABOLIC PANEL, COMPREHENSIVE   6. Chronic pain of right lower extremity M79.604 729.5     G89.29 338.29    7. Bilateral hearing  loss, unspecified hearing loss type H91.93 389.9         Follow-up Disposition: Not on File   Advised her to call back or return to office if symptoms worsen/change/persist.  Discussed expected course/resolution/complications of diagnosis in detail with patient.    Medication risks/benefits/costs/interactions/alternatives discussed with patient.  She was given an after visit summary which includes diagnoses, current medications, & vitals.  She expressed understanding with the diagnosis and plan.    This is an Initial Medicare Annual Wellness Exam (AWV) (Performed 12 months after IPPE or effective date of Medicare Part B enrollment, Once in a lifetime)    I have reviewed the patient's medical history in detail and updated the computerized patient record.     History     Past Medical History:   Diagnosis Date   ??? Anginal pain ([place])    ??? Arthritis    ??? Constipation    ??? Hearing loss    ??? Hearing loss    ??? High cholesterol    ??? Hypertension    ??? Incontinence    ??? Joint pain    ??? Memory disorder    ??? Muscle pain    ??? Ringing in the ears    ??? Snoring    ??? Visual disturbance       Past Surgical History:   Procedure Laterality Date   ??? HX HYSTERECTOMY     ??? HX ORTHOPAEDIC      epidural pain mtg     Current Outpatient Prescriptions   Medication Sig Dispense Refill   ??? oxybutynin (DITROPAN) 5 mg tablet TAKE 1 TABLET BY MOUTH TWICE DAILY 180 Tab 3   ??? simvastatin (ZOCOR) 5 mg tablet Take  by mouth nightly.     ??? polyethylene glycol (MIRALAX) 17 gram/dose powder TAKE 1 CAPFUL PO ONCE A DAY  11   ??? nystatin-triamcinolone (MYCOLOG) 100,000-0.1 unit/gram-% ointment APP AA BID PRN  0   ??? verapamil ER (CALAN-SR) 180 mg CR tablet Take 1 Tab by mouth nightly. 90 Tab 1   ??? cilostazol (PLETAL) 100 mg tablet TAKE 1 TABLET BY MOUTH TWICE DAILY ON EMPTY STOMACH 180 Tab 0   ??? hydroCHLOROthiazide (MICROZIDE) 12.5 mg capsule TAKE 1 CAPSULE BY MOUTH DAILY 90 Cap 0   ??? alendronate (FOSAMAX) 70 mg tablet TAKE 1 TABLET BY MOUTH EVERY 7 DAYS 12 Tab 6    ??? aspirin 81 mg chewable tablet Take 81 mg by mouth daily.     ??? coenzyme q10-vitamin e (COQ10 SG 100) 100-100 mg-unit cap Take  by mouth.     ??? ezetimibe (ZETIA) 10 mg tablet Take 1 Tab by mouth daily. 90 Tab 1   ??? omeprazole (PRILOSEC) 40 mg capsule Take 1 Cap by mouth daily. 30 Cap 0   ??? ascorbic acid (VITAMIN C) 1,000 mg tablet Take  by mouth.     ??? isosorbide dinitrate (ISORDIL) 10 mg tablet Take  by mouth two (2) times a day.     ??? VITAMIN E MIXED (NATURAL VITAMIN E PO) Take 400 Units by mouth. 3 x week      ??? vitamin A 8,000 unit capsule Take 8,000 Units by mouth daily.       Allergies   Allergen Reactions   ??? Pcn [Penicillins] Unknown (comments)   ??? Sulfa (Sulfonamide Antibiotics) Unknown (comments)     Family History   Problem Relation Age of Onset   ??? Breast Cancer Mother 52   ??? Breast Cancer Sister  42   ??? Breast Cancer Daughter 105   ??? Dementia Brother    ??? Dementia Sister      Social History   Substance Use Topics   ??? Smoking status: Never Smoker   ??? Smokeless tobacco: Never Used   ??? Alcohol use No     Patient Active Problem List   Diagnosis Code   ??? SOB (shortness of breath) on exertion R06.02   ??? Hypertension, essential, benign I10   ??? High cholesterol E78.00   ??? Chest pain with normal angiography--~ 15 yrs ago MCV R07.9   ??? Primary insomnia F51.01   ??? Memory loss R41.3   ??? Acute bacterial conjunctivitis of right eye H10.31         Depression Risk Factor Screening:     PHQ over the last two weeks 12/01/2015   Little interest or pleasure in doing things Not at all   Feeling down, depressed or hopeless Not at all   Total Score PHQ 2 0   patient answered no to both questions  Alcohol Risk Factor Screening:   On any occasion during the past 3 months, have you had more than 3 drinks containing alcohol?  No    Do you average more than 7 drinks per week?  No    Functional Ability and Level of Safety:     Hearing Loss   Moderate-   Has bilateral hearing aids  [facility]  Activities of Daily Living    Self-care.   Requires assistance with: ambulation    Fall Risk     Fall Risk Assessment, last 12 mths 12/01/2015   Able to walk? Yes   Fall in past 12 months? No   Fall with injury? -   Number of falls in past 12 months -   Fall Risk Score -     Abuse Screen   Patient is not abused    Review of Systems   A comprehensive review of systems was negative except for that written in the HPI.    Physical Examination     No exam data present    Evaluation of Cognitive Function:  Mood/affect:  happy  Appearance: age appropriate  Family member/caregiver input: none        Patient Care Team:  Darlys Gales, MD as PCP - General (Internal Medicine)    Advice/Referrals/Counseling   Education and counseling provided:  Are appropriate based on today's review and evaluation    Advanced care planning discussed patient and niece say she has all ACP completed    Assessment/Plan       ICD-10-CM ICD-9-CM    1. Encounter for immunization Z23 V03.89 TETANUS, DIPHTHERIA TOXOIDS AND ACELLULAR PERTUSSIS VACCINE (TDAP), IN INDIVIDS. >=7, IM   2. Routine general medical examination at a health care facility [id number] METABOLIC PANEL, COMPREHENSIVE   3. Screening for alcoholism Z13.89 V79.1    4. Essential hypertension [id number] METABOLIC PANEL, COMPREHENSIVE   5. Pituitary tumor [id number] METABOLIC PANEL, COMPREHENSIVE  Continue follow up with neurology  MRI has been ordered by neuro and is pending   6. Chronic pain of right lower extremity M79.604 729.5 Scheduled Tylenol 650 mg 2 to 3 times per day. Will give script  Continue Pletal    G89.29 338.29    7. Bilateral hearing loss, unspecified hearing loss type H91.93 389.9 Refer to audiologist. They will make an appointment   .8 hand laceration- attempted to cleanse and place steri strips.  Unable to stop bleeding via holding pressure. Patient will need sutures. i dressed the area with clean bandage and advised to go to acute care for suture. TDAP given today at [facility]

## 2015-12-14 ENCOUNTER — Ambulatory Visit: Payer: MEDICARE | Primary: Family Medicine

## 2015-12-14 LAB — METABOLIC PANEL, COMPREHENSIVE
A-G Ratio: 1.3 (ref 1.2–2.2)
ALT (SGPT): 15 IU/L (ref 0–32)
AST (SGOT): 38 IU/L (ref 0–40)
Albumin: 3.9 g/dL (ref 3.2–4.6)
Alk. phosphatase: 68 IU/L (ref 39–117)
BUN/Creatinine ratio: 17 (ref 12–28)
BUN: 12 mg/dL (ref 10–36)
Bilirubin, total: 0.3 mg/dL (ref 0.0–1.2)
CO2: 19 mmol/L (ref 18–29)
Calcium: 9.3 mg/dL (ref 8.7–10.3)
Chloride: 93 mmol/L — ABNORMAL LOW (ref 96–106)
Creatinine: 0.72 mg/dL (ref 0.57–1.00)
GFR est AA: 85 mL/min/{1.73_m2} (ref >59)
GFR est non-AA: 74 mL/min/{1.73_m2} (ref >59)
GLOBULIN, TOTAL: 2.9 g/dL (ref 1.5–4.5)
Glucose: 78 mg/dL (ref 65–99)
Potassium: 5.9 mmol/L — ABNORMAL HIGH (ref 3.5–5.2)
Protein, total: 6.8 g/dL (ref 6.0–8.5)
Sodium: 133 mmol/L — ABNORMAL LOW (ref 134–144)

## 2015-12-15 MED ORDER — ISOSORBIDE DINITRATE 10 MG TAB
10 mg | ORAL_TABLET | ORAL | 1 refills | Status: DC
Start: 2015-12-15 — End: 2016-12-15

## 2015-12-15 NOTE — Progress Notes (Signed)
Called and spoke with the patient's niece linda and informed her that her aunts potassium was elevated and needed to be rechecked as soon as possible. She voiced an understanding and will have it rechecked tomorrow at [facility], L[facility]

## 2015-12-15 NOTE — Progress Notes (Signed)
Please let patient's niece know that her kidney function was normal    Let her know that her potassium level was elevated and needs to be repeated. She can go to [facility] i will place order.

## 2015-12-17 LAB — METABOLIC PANEL, BASIC
BUN/Creatinine ratio: 17 (ref 12–28)
BUN: 11 mg/dL (ref 10–36)
CO2: 24 mmol/L (ref 18–29)
Calcium: 9.5 mg/dL (ref 8.7–10.3)
Chloride: 96 mmol/L (ref 96–106)
Creatinine: 0.66 mg/dL (ref 0.57–1.00)
GFR est AA: 90 mL/min/{1.73_m2} (ref >59)
GFR est non-AA: 78 mL/min/{1.73_m2} (ref >59)
Glucose: 69 mg/dL (ref 65–99)
Potassium: 4.2 mmol/L (ref 3.5–5.2)
Sodium: 135 mmol/L (ref 134–144)

## 2015-12-20 NOTE — Telephone Encounter (Signed)
Patient will like to know the results of her lab testing. Please contact Marlou Porch (neice) at your earliest convenience. Thanks!

## 2015-12-22 NOTE — Telephone Encounter (Signed)
Message left to alert Ms. Kimberly [name] that a result letter was sent out. Results were normal for her aunt

## 2015-12-29 MED ORDER — CILOSTAZOL 100 MG TAB
100 mg | ORAL_TABLET | ORAL | 0 refills | Status: DC
Start: 2015-12-29 — End: 2016-03-30

## 2016-01-04 ENCOUNTER — Ambulatory Visit: Admit: 2016-01-04 | Discharge: 2016-01-04 | Payer: MEDICARE | Attending: Neurology | Primary: Family Medicine

## 2016-01-04 DIAGNOSIS — R202 Paresthesia of skin: Secondary | ICD-10-CM

## 2016-01-04 MED ORDER — GABAPENTIN 100 MG CAP
100 mg | ORAL_CAPSULE | ORAL | 0 refills | Status: DC
Start: 2016-01-04 — End: 2016-02-03

## 2016-01-04 MED ORDER — GABAPENTIN 100 MG CAP
100 mg | ORAL_CAPSULE | Freq: Three times a day (TID) | ORAL | 0 refills | Status: DC
Start: 2016-01-04 — End: 2016-01-04

## 2016-01-04 MED ORDER — TRAMADOL 50 MG TAB
50 mg | ORAL_TABLET | Freq: Four times a day (QID) | ORAL | 1 refills | Status: DC | PRN
Start: 2016-01-04 — End: 2016-05-01

## 2016-01-04 MED ORDER — WALKER
Freq: Every day | 0 refills | Status: DC
Start: 2016-01-04 — End: 2018-02-05

## 2016-01-04 MED ORDER — DEXAMETHASONE 0.5 MG TAB
0.5 mg | ORAL_TABLET | Freq: Two times a day (BID) | ORAL | 0 refills | Status: DC
Start: 2016-01-04 — End: 2016-05-01

## 2016-01-04 NOTE — Progress Notes (Signed)
Chief Complaint   Patient presents with   ??? Numbness   ??? Hospital Follow Up     1. Have you been to the ER, urgent care clinic since your last visit?  Hospitalized since your last visit? [facility] 12/26/15    2. Have you seen or consulted any other health care providers outside of the [facility] since your last visit?  Include any pap smears or colon screening. [facility]

## 2016-01-04 NOTE — Progress Notes (Signed)
Neurology Progress Note    NAME:  Kimberly [name]   DOB:   1923-09-04   MRN:   [id number]     Date/Time:  01/07/2016  Subjective:      Kimberly [name] is a 82 y.o. female here today for follow up.Headaches morning, dizziness. Leg pain.Pain was accompanied to the visit today by her niece,Mrs [place].Says couple of weeks ago , she was on vistation at the [employer] area,, when she blacked out again and was hospitalized for 2 days, MRI Brain was obtained, reviewing the  CD of the MRI, showed  Piituitary Macroadenoma. Says lately she is experiencing  Headaches mostly in the mornings.Headache throbbing in nature.Says she also has been having leg pains, walking and activity makes the pain worse.Admits numbness and tingling sensation.  Denies dysphagia or odynophagia,neck pain, chest pain,diarrhea, dysuria, hematuria or hematochezia.   Denies blurry vision or double vision.  Review of Systems:   Neurological ROS: positive for - dizziness, gait disturbance, headaches, impaired coordination/balance, memory loss, numbness/tingling and weakness         Unable to obtain  ROS due to  mental status change  sedated   intubated    Medications reviewed:  Current Outpatient Prescriptions   Medication Sig Dispense Refill   ??? dexamethasone (DECADRON) 0.5 mg tablet Take 1 Tab by mouth two (2) times daily (with meals). 20 Tab 0   ??? traMADol (ULTRAM) 50 mg tablet Take 1 Tab by mouth every six (6) hours as needed for Pain. Max Daily Amount: 200 mg. 30 Tab 1   ??? Walker misc 1 Units by Does Not Apply route daily. 1 Each 0   ??? cilostazol (PLETAL) 100 mg tablet TAKE 1 TABLET BY MOUTH TWICE DAILY ON EMPTY STOMACH 180 Tab 0   ??? isosorbide dinitrate (ISORDIL) 10 mg tablet TAKE 1 TABLET BY MOUTH TWICE DAILY 180 Tab 1   ??? acetaminophen (TYLENOL) 325 mg tablet Take 2 Tabs by mouth every eight (8) hours. 180 Tab 3   ??? oxybutynin (DITROPAN) 5 mg tablet TAKE 1 TABLET BY MOUTH TWICE DAILY 180 Tab 3    ??? simvastatin (ZOCOR) 5 mg tablet Take  by mouth nightly.     ??? polyethylene glycol (MIRALAX) 17 gram/dose powder TAKE 1 CAPFUL PO ONCE A DAY  11   ??? verapamil ER (CALAN-SR) 180 mg CR tablet Take 1 Tab by mouth nightly. 90 Tab 1   ??? hydroCHLOROthiazide (MICROZIDE) 12.5 mg capsule TAKE 1 CAPSULE BY MOUTH DAILY 90 Cap 0   ??? alendronate (FOSAMAX) 70 mg tablet TAKE 1 TABLET BY MOUTH EVERY 7 DAYS 12 Tab 6   ??? aspirin 81 mg chewable tablet Take 81 mg by mouth daily.     ??? coenzyme q10-vitamin e (COQ10 SG 100) 100-100 mg-unit cap Take  by mouth.     ??? ezetimibe (ZETIA) 10 mg tablet Take 1 Tab by mouth daily. 90 Tab 1   ??? omeprazole (PRILOSEC) 40 mg capsule Take 1 Cap by mouth daily. 30 Cap 0   ??? ascorbic acid (VITAMIN C) 1,000 mg tablet Take  by mouth.     ??? VITAMIN E MIXED (NATURAL VITAMIN E PO) Take 400 Units by mouth. 3 x week      ??? vitamin A 8,000 unit capsule Take 8,000 Units by mouth daily.     ??? gabapentin (NEURONTIN) 100 mg capsule TAKE 1 CAPSULE BY MOUTH THREE TIMES DAILY 270 Cap 0   ??? nystatin-triamcinolone (MYCOLOG) 100,000-0.1 unit/gram-% ointment  APP AA BID PRN  0        Objective:   Vitals:  Vitals:    01/04/16 1049   BP: 113/69   Pulse: (!) 103   Resp: 18   Temp: 98.2 ??F (36.8 ??C)   SpO2: 97%   Weight: 127 lb 3.2 oz (57.7 kg)   Height: 5\' 2"  (1.575 m)   PainSc:   8   PainLoc: Leg               Lab Data Reviewed:  Lab Results   Component Value Date/Time    WBC 3.4 03/08/2015 09:03 AM    HCT 36.7 03/08/2015 09:03 AM    HGB 11.9 03/08/2015 09:03 AM    PLATELET 276 03/08/2015 09:03 AM       Lab Results   Component Value Date/Time    Sodium 135 12/16/2015 10:28 AM    Potassium 4.2 12/16/2015 10:28 AM    Chloride 96 12/16/2015 10:28 AM    CO2 24 12/16/2015 10:28 AM    Glucose 69 12/16/2015 10:28 AM    BUN 11 12/16/2015 10:28 AM    Creatinine 0.66 12/16/2015 10:28 AM    Calcium 9.5 12/16/2015 10:28 AM       No components found for: TROPQUANT    No results found for: ANA       No results found for: HBA1C, HGBE8, HBA1CPOC, HBA1CEXT, HBA1CEXT     Lab Results   Component Value Date/Time    Vitamin B12 1014 03/08/2015 09:03 AM       No results found for: ANA, Phillip Heal, XBANA    Lab Results   Component Value Date/Time    Cholesterol, total 176 08/10/2015 01:02 PM    HDL Cholesterol 74 08/10/2015 01:02 PM    LDL, calculated 90 08/10/2015 01:02 PM    VLDL, calculated 12 08/10/2015 01:02 PM    Triglyceride 60 08/10/2015 01:02 PM         CT Results (recent):    Results from Hospital Encounter encounter on 07/28/12   CT ABD W CONT   Narrative **Final Report**      ICD Codes / Adm.Diagnosis: 783.21  285.9 / Loss of weight  Anemia,   unspecified  Examination:  CT ABDOMEN W CON  - [id number] - Jul 28 2012 11:34AM  Accession No:  [id number]  Reason:  weight loss      REPORT:  INDICATION:  Weight loss, anemia.    COMPARISON:  05/12/2010 and 11/21/2007.    TECHNIQUE:  Contiguous CT images were obtained of the abdomen with and   delayed 100 mL of Optiray 350 contrast.  Oral contrast was administered as   well.  Coronal and sagittal reformatted images were then obtained.    CT ABDOMEN:  A stable few hepatic hypodensities/cysts the largest at the   lateral dome of the right of the liver measuring 1.2 cm are seen.  A stable   flash filling lesion at the posterior dome of the right lobe of the liver is   seen.  No new hepatic abnormality is appreciated.  The kidneys show   bilateral cysts larger and more numerous on the right. The spleen, pancreas,   and adrenal glands show a satisfactory appearance.  The abdomen aorta is   normal in caliber and shows atherosclerotic mural calcification.  The   gallbladder is normally distended.  The biliary ducts are not dilated.    There is no ascites. The lung bases are clear  Oral contrast in the distal esophagus suggesting delayed clearing or reflux   is present. The visualized small bowel is normal in caliber. The visualized    colon shows fecal stasis.  Grade 1 spondylolisthesis, discogenic disease,   and facet arthropathy at L3-L4 and L4-5 are again seen.       IMPRESSION:  Stable hepatic hypodensities and stable hepatic flash filling   lesion.    Bilateral renal cysts, no hydronephrosis.    Grade 1 spondylolisthesis, discogenic disease, and degenerative change at   L3-L4 and L4-5.                                       Signing/Reading Doctor: Milus Glazier [phone])    Approved: Milus Glazier [id number])  Jul 28 2012  2:15PM                                MRI Results (recent):    Results from Hospital Encounter encounter on 07/07/11   MRI LUMB SPINE WO CONT   Narrative **Final Report**      ICD Codes / Adm.Diagnosis: 724.3  738.4 / Sciatica  Acquired   spondylolisthesis  Examination:  MR L SPINE WO CON  - [id number] - Jul 07 2011  2:13PM  Accession No:  [id number]  Reason:  SCIATICA      REPORT:  EXAM:  MR L SPINE WO CON    INDICATION: Low back and bilateral lower extremity pain    COMPARISON: Lumbar spine plain films on April 11, 2010    TECHNIQUE: Sagittal T1, T2, STIR and axial T1 and T2 noncontrast MRI of the   lumbar spine .    FINDINGS: Grade 1 anterolisthesis of L3-4 and L4-5 is unchanged.  Severe   facet arthrosis at these levels.  No definitive spondylolysis.  No new   subluxation.    Degenerative bone marrow signal is greatest at L4-5.  No marrow infiltrative   process. No compression deformity. Disc desiccation and vacuum disc   phenomenon are severe at L3-4 and L4-5.    Conus medullaris terminates at L1. Signal and caliber of the distal cord and   conus are within normal limits. Bilateral renal cystic lesions are partially   imaged..    T12-L1: No herniation or stenosis.    L1-L2: Diffuse disc bulge, facet arthrosis, and thickened ligamentum flavum.   No stenosis.    L2-L3: Diffuse disc bulge, facet arthrosis, and thickened ligamentum flavum.   Minimal central spinal canal stenosis.  Mild left foraminal stenosis.     L3-L4: Uncovered diffuse disc bulge and severe facet arthrosis.  Severe   central spinal canal stenosis.  Severe bilateral foraminal stenosis.    L4-L5: Uncovered diffuse disc bulge, facet arthrosis, and thickened   ligamentum flavum.  Moderate-severe central spinal canal stenosis.    Moderate-severe bilateral foraminal stenosis.    L5-S1: No herniation or stenosis.       IMPRESSION:  1.  L3-4 severe central spinal canal stenosis and bilateral foraminal   stenosis.  2.  L4-5 moderate-severe central spinal canal stenosis and bilateral   foraminal stenosis.  3.  Unchanged grade 1 anterolisthesis of L3-4 and L4-5 secondary to severe   central arthrosis.           Signing/Reading Doctor: Marilynne Drivers [phone])    Approved: ALEX  SLEEKER [id number])  Jul 09 2011  8:31AM                                      IR Results (recent):  No results found for this or any previous visit.    VAS/US Results (recent):    Results from Hospital Encounter encounter on 06/14/11   DUPLEX UPPER EXT VENOUS LEFT    PHYSICAL EXAM:  General:    Alert, cooperative, no distress, appears stated age.     Head:   Normocephalic, without obvious abnormality, atraumatic.  Eyes:   Conjunctivae/corneas clear.  PERRLA  Nose:  Nares normal. No drainage or sinus tenderness.  Throat:    Lips, mucosa, and tongue normal.  No Thrush  Neck:  Supple, symmetrical,  no adenopathy, thyroid: non tender    no carotid bruit and no JVD.  Back:    Symmetric,  No CVA tenderness.  Lungs:   Clear to auscultation bilaterally.  No Wheezing or Rhonchi. No rales.  Chest wall:  No tenderness or deformity. No Accessory muscle use.  Heart:   Regular rate and rhythm,  no murmur, rub or gallop.  Abdomen:   Soft, non-tender. Not distended.  Bowel sounds normal. No masses  Extremities: Extremities normal, atraumatic, No cyanosis.  No edema. No clubbing  Skin:     Texture, turgor normal. No rashes or lesions.  Not Jaundiced  Lymph nodes: Cervical, supraclavicular normal.   Psych:  Good insight.  Not depressed.  Not anxious or agitated.      NEUROLOGICAL EXAM:  Appearance:  The patient is well developed, well nourished, provides a coherent history and is in no acute distress.   Mental Status: Oriented to time, place and person. Mood and affect appropriate.   Cranial Nerves:   Intact visual fields. Fundi are benign. PERLA, EOM's full, no nystagmus, no ptosis. Facial sensation is normal. Corneal reflexes are intact. Facial movement is symmetric. Hearing is normal bilaterally. Palate is midline with normal sternocleidomastoid and trapezius muscles are normal. Tongue is midline.   Motor:  5/5 strength in upper and 4+/5 lower proximal and distal muscles. Normal bulk and tone. No fasciculations.   Reflexes:   Deep tendon reflexes 2+/4 and symmetrical.   Sensory:   Equivocal sensation to touch, pinprick and vibration.   Gait:  Unsteady gait.   Tremor:   No tremor noted.   Cerebellar:  No cerebellar signs present.   Neurovascular:  Normal heart sounds and regular rhythm, peripheral pulses intact, and no carotid bruits.       Assesment  There are no diagnoses linked to this encounter.  ___________________________________________________  PLAN:Neurontin 100mg  p.o.tid  Decadron .5mg  p.o bid x10days  Tramadol 50mg  p.o. q8hrly prn for pain  EEG      ICD-10-CM ICD-9-CM    1. Paresthesia R20.2 782.0 EEG      REFERRAL TO PHYSICAL THERAPY   2. DDD (degenerative disc disease), thoracolumbar M51.35 722.51 REFERRAL TO PHYSICAL THERAPY   3. Pain in both lower extremities M79.604 729.5     M79.605     4. Headache, unspecified headache type R51 784.0 EEG   5. Syncope and collapse R55 780.2 EEG   6. Falls frequently R29.6 V15.88 REFERRAL TO PHYSICAL THERAPY   7. 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 227.3      Follow-up Disposition:  Return in about 4 weeks (around 02/01/2016).  ___________________________________________________     Total time spent with patient:  15   25   35    __ minutes    Care Plan discussed with:    Patient   Family    Care Manager   Consultant/Specialist :    ___________________________________________________    Attending Physician: Nelwyn Salisbury, MD

## 2016-01-05 NOTE — Telephone Encounter (Signed)
Patient was prescribed medication yesterday and she is not feeling well.  Patient was prescribed Tramadol and Gabapentin.  Patient has been feeling nauseated this morning.  Patient took the medication last night but hasn't taken this morning.  She wants to remind you that patient is 69 so this is of great importance she says.

## 2016-01-09 ENCOUNTER — Encounter: Attending: Internal Medicine | Primary: Family Medicine

## 2016-01-10 ENCOUNTER — Inpatient Hospital Stay: Admit: 2016-01-10 | Payer: MEDICARE | Attending: Neurology | Primary: Family Medicine

## 2016-01-10 DIAGNOSIS — R202 Paresthesia of skin: Secondary | ICD-10-CM

## 2016-01-17 ENCOUNTER — Ambulatory Visit: Admit: 2016-01-17 | Discharge: 2016-01-17 | Payer: MEDICARE | Attending: Internal Medicine | Primary: Family Medicine

## 2016-01-17 DIAGNOSIS — D497 Neoplasm of unspecified behavior of endocrine glands and other parts of nervous system: Secondary | ICD-10-CM

## 2016-01-17 NOTE — Progress Notes (Signed)
Subjective:      Kimberly [name] is a 82 y.o. female who presents today for   May 21- 28   Was in [place]. Patient says she got up to take her plate to the sink and passed out. She was seen at [facility] where she was monitored for 2 days. When she fainted seizure like activity was noted. She was also noted to have low blood pressure  She had a similar episode a few months ago.  She has recently seen by Dr. Verdia Kuba her neurologist who ordered an MRI and EEG.  Started on decadron          Chronic leg pains - now taking tramadol  prescribed by Dr. Verdia Kuba. She says she is taking it 3x daily  Also put on gabapentin which she is taking 3 x daily  She says her leg pain is better    Hyperlipidemia- compliant with statin and zetia  Hypertension- taking verapamil and hctz. BP runs low at times.  Retinitis Pigmentosa- sees ophthalmology, says over last week vision has worsened- sees ophthalmologist      Patient Active Problem List    Diagnosis Date Noted   ??? 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 Memory loss 11/21/2014   ??? Acute bacterial conjunctivitis of right eye 11/21/2014   ??? SOB (shortness of breath) on exertion 06/21/2014   ??? Chest pain with normal angiography--~ 15 yrs ago [facility] 06/21/2014   ??? Hypertension, essential, benign    ??? High cholesterol      Current Outpatient Prescriptions   Medication Sig Dispense Refill   ??? dexamethasone (DECADRON) 0.5 mg tablet Take 1 Tab by mouth two (2) times daily (with meals). 20 Tab 0   ??? traMADol (ULTRAM) 50 mg tablet Take 1 Tab by mouth every six (6) hours as needed for Pain. Max Daily Amount: 200 mg. 30 Tab 1   ??? Walker misc 1 Units by Does Not Apply route daily. 1 Each 0   ??? gabapentin (NEURONTIN) 100 mg capsule TAKE 1 CAPSULE BY MOUTH THREE TIMES DAILY 270 Cap 0   ??? cilostazol (PLETAL) 100 mg tablet TAKE 1 TABLET BY MOUTH TWICE DAILY ON EMPTY STOMACH 180 Tab 0   ??? isosorbide dinitrate (ISORDIL) 10 mg tablet TAKE 1 TABLET BY MOUTH TWICE DAILY 180 Tab 1    ??? acetaminophen (TYLENOL) 325 mg tablet Take 2 Tabs by mouth every eight (8) hours. 180 Tab 3   ??? oxybutynin (DITROPAN) 5 mg tablet TAKE 1 TABLET BY MOUTH TWICE DAILY 180 Tab 3   ??? simvastatin (ZOCOR) 5 mg tablet Take  by mouth nightly.     ??? polyethylene glycol (MIRALAX) 17 gram/dose powder TAKE 1 CAPFUL PO ONCE A DAY  11   ??? nystatin-triamcinolone (MYCOLOG) 100,000-0.1 unit/gram-% ointment APP AA BID PRN  0   ??? verapamil ER (CALAN-SR) 180 mg CR tablet Take 1 Tab by mouth nightly. 90 Tab 1   ??? hydroCHLOROthiazide (MICROZIDE) 12.5 mg capsule TAKE 1 CAPSULE BY MOUTH DAILY 90 Cap 0   ??? alendronate (FOSAMAX) 70 mg tablet TAKE 1 TABLET BY MOUTH EVERY 7 DAYS 12 Tab 6   ??? aspirin 81 mg chewable tablet Take 81 mg by mouth daily.     ??? coenzyme q10-vitamin e (COQ10 SG 100) 100-100 mg-unit cap Take  by mouth.     ??? ezetimibe (ZETIA) 10 mg tablet Take 1 Tab by mouth daily. 90 Tab 1   ??? omeprazole (PRILOSEC) 40 mg capsule Take  1 Cap by mouth daily. 30 Cap 0   ??? ascorbic acid (VITAMIN C) 1,000 mg tablet Take  by mouth.     ??? VITAMIN E MIXED (NATURAL VITAMIN E PO) Take 400 Units by mouth. 3 x week      ??? vitamin A 8,000 unit capsule Take 8,000 Units by mouth daily.       Allergies   Allergen Reactions   ??? Pcn [Penicillins] Unknown (comments)   ??? Sulfa (Sulfonamide Antibiotics) Unknown (comments)     Past Medical History:   Diagnosis Date   ??? Anginal pain ([place])    ??? Arthritis    ??? Constipation    ??? Hearing loss    ??? Hearing loss    ??? High cholesterol    ??? Hypertension    ??? Incontinence    ??? Joint pain    ??? Memory disorder    ??? Muscle pain    ??? Ringing in the ears    ??? Snoring    ??? Visual disturbance      Past Surgical History:   Procedure Laterality Date   ??? HX HYSTERECTOMY     ??? HX ORTHOPAEDIC      epidural pain mtg     Family History   Problem Relation Age of Onset   ??? Breast Cancer Mother 35   ??? Breast Cancer Sister 96   ??? Breast Cancer Daughter 42   ??? Dementia Brother    ??? Dementia Sister      Social History    Substance Use Topics   ??? Smoking status: Never Smoker   ??? Smokeless tobacco: Never Used   ??? Alcohol use No        Review of Systems    A comprehensive review of systems was negative except for that written in the HPI.     Objective:     There were no vitals taken for this visit.  General:  Alert, cooperative, no distress, appears stated age.   Head:  Normocephalic, without obvious abnormality, atraumatic.   Eyes:  Conjunctivae/corneas clear. PERRL, EOMs intact. Fundi benign.   Ears:  Normal TMs and external ear canals both ears.   Nose: Nares normal. Septum midline. Mucosa normal. No drainage or sinus tenderness.   Throat: Lips, mucosa, and tongue normal. Teeth and gums normal.   Neck: Supple, symmetrical, trachea midline, no adenopathy, thyroid: no enlargement/tenderness/nodules, no carotid bruit and no JVD.   Back:   Symmetric, no curvature. ROM normal. No CVA tenderness.   Lungs:   Clear to auscultation bilaterally.   Chest wall:  No tenderness or deformity.   Heart:  Regular rate and rhythm, S1, S2 normal, no murmur, click, rub or gallop.   Abdomen:   Soft, non-tender. Bowel sounds normal. No masses,  No organomegaly.   Extremities: Extremities normal, atraumatic, no cyanosis or edema.   Pulses: 2+ and symmetric all extremities.   Skin: Skin color, texture, turgor normal. No rashes or lesions.   Lymph nodes: Cervical, supraclavicular, and axillary nodes normal.   Neurologic: CNII-XII intact. Normal strength, sensation and reflexes throughout.       Assessment/Plan:   Pituitary tumor- managed by neurology    Syncopal episode- work up pending. Awaiting results of MRI and EEG    Chronic leg pain- continue gabapentin and tramadol. i advised to cut back on tramadol dosage from 3 times daily to twice daily    Hyperlipidemia- continue statin    Hypertension- continue verapamil. Discontinue HCTZ for  now since BP running low at times    Retinitis pigmentosa- patient reports decline in vision. I advised to  schedule appt with ophthalmologist    Follow-up Disposition: Not on File   Advised her to call back or return to office if symptoms worsen/change/persist.  Discussed expected course/resolution/complications of diagnosis in detail with patient.    Medication risks/benefits/costs/interactions/alternatives discussed with patient.  She was given an after visit summary which includes diagnoses, current medications, & vitals.  She expressed understanding with the diagnosis and plan.

## 2016-01-20 NOTE — Telephone Encounter (Signed)
-----   Message from Lindi Adie sent at 01/20/2016  2:07 PM EDT -----  Regarding: Dr. Rhunette Croft, pt's niece, called to see if she should stop taking the pt to the chiropractor because of the fainting spells she has been having. If not answer please left message. Best contact number [phone].

## 2016-01-20 NOTE — Telephone Encounter (Signed)
-----   Message from Lindi Adie sent at 01/20/2016  2:07 PM EDT -----  Regarding: Dr. Rhunette Croft, pt's niece, called to see if she should stop taking the pt to the chiropractor because of the fainting spells she has been having. If not answer please left message. Best contact number [phone].

## 2016-01-26 NOTE — Telephone Encounter (Signed)
Called and spoke to pt's niece per doctor latham she is to hold off taking pt to chiropractor until further notice. Ms. Kimberly [name] voiced an understanding and will have pt follow up as needed. [name], LPN

## 2016-01-31 ENCOUNTER — Encounter: Attending: Medical | Primary: Family Medicine

## 2016-01-31 MED ORDER — GABAPENTIN 100 MG CAP
100 mg | ORAL_CAPSULE | ORAL | 0 refills | Status: DC
Start: 2016-01-31 — End: 2016-02-03

## 2016-02-03 ENCOUNTER — Ambulatory Visit: Admit: 2016-02-03 | Discharge: 2016-02-03 | Payer: MEDICARE | Attending: Neurology | Primary: Family Medicine

## 2016-02-03 DIAGNOSIS — D352 Benign neoplasm of pituitary gland: Secondary | ICD-10-CM

## 2016-02-03 MED ORDER — METOCLOPRAMIDE 5 MG TAB
5 mg | ORAL_TABLET | Freq: Four times a day (QID) | ORAL | 0 refills | Status: AC
Start: 2016-02-03 — End: 2016-02-13

## 2016-02-03 MED ORDER — CODEINE-BUTALBITAL-ACETAMINOPHEN-CAFFEINE 30 MG-50 MG-325 MG-40 MG CAP
50-325-40-30 mg | ORAL_CAPSULE | Freq: Four times a day (QID) | ORAL | 0 refills | Status: DC | PRN
Start: 2016-02-03 — End: 2016-02-03

## 2016-02-03 MED ORDER — GABAPENTIN 100 MG CAP
100 mg | ORAL_CAPSULE | Freq: Three times a day (TID) | ORAL | 0 refills | Status: DC
Start: 2016-02-03 — End: 2016-03-23

## 2016-02-03 NOTE — Progress Notes (Signed)
Chief Complaint   Patient presents with   ??? Numbness   ??? Other     EEG and MRI results     1. Have you been to the ER, urgent care clinic since your last visit?  Hospitalized since your last visit? no    2. Have you seen or consulted any other health care providers outside of the [facility] since your last visit?  Include any pap smears or colon screening. no      Patient stated she woke up with a headache this morning. The headache is a dull pain She was nauseated around 9:30 this morning. Patient stated she ate about 8:30 am this morning, The Gabapentin is not helping per patient.

## 2016-02-03 NOTE — Progress Notes (Signed)
Neurology Progress Note    NAME:  Kimberly [name]   DOB:   Jun 24, 1924   MRN:   [id number]     Date/Time:  02/03/2016  Subjective:      Kimberly [name] is a 82 y.o. female here today for  Follow up.  Pain is sharp mostly in the lower extremity with tingling sensation, aggravated by activity.  No fall reported, no blackout spell since last visit.  Says she has been experiencing headache,dull throbbing in nature,and there has been persistent nausea,Nausea is not always associated with headache.  Says she is having blurry vision and also change in her eye sight, patient has appointment with eye doctor  Denies vertigo, ringing in the ear, double vision,difficulty swallowing,constipation,diarrhea,dysuria, hematuria or hematochezia.  Admits joint pain, neck pain , blurry vision.  Patient was accompanied by her niece, Kimberly [name].    Review of Systems:   Neurological ROS: positive for - dizziness, gait disturbance, headaches and numbness/tingling      Medications reviewed:  Current Outpatient Prescriptions   Medication Sig Dispense Refill   ??? gabapentin (NEURONTIN) 100 mg capsule Take 1 Cap by mouth three (3) times daily. 270 Cap 0   ??? metoclopramide HCl (REGLAN) 5 mg tablet Take 1 Tab by mouth four (4) times daily for 10 days. 40 Tab 0   ??? traMADol (ULTRAM) 50 mg tablet Take 1 Tab by mouth every six (6) hours as needed for Pain. Max Daily Amount: 200 mg. 30 Tab 1   ??? Walker misc 1 Units by Does Not Apply route daily. 1 Each 0   ??? cilostazol (PLETAL) 100 mg tablet TAKE 1 TABLET BY MOUTH TWICE DAILY ON EMPTY STOMACH 180 Tab 0   ??? isosorbide dinitrate (ISORDIL) 10 mg tablet TAKE 1 TABLET BY MOUTH TWICE DAILY 180 Tab 1   ??? acetaminophen (TYLENOL) 325 mg tablet Take 2 Tabs by mouth every eight (8) hours. 180 Tab 3   ??? oxybutynin (DITROPAN) 5 mg tablet TAKE 1 TABLET BY MOUTH TWICE DAILY 180 Tab 3   ??? simvastatin (ZOCOR) 5 mg tablet Take  by mouth nightly.     ??? polyethylene glycol (MIRALAX) 17 gram/dose powder TAKE 1 CAPFUL PO ONCE  A DAY  11   ??? verapamil ER (CALAN-SR) 180 mg CR tablet Take 1 Tab by mouth nightly. 90 Tab 1   ??? aspirin 81 mg chewable tablet Take 81 mg by mouth daily.     ??? coenzyme q10-vitamin e (COQ10 SG 100) 100-100 mg-unit cap Take  by mouth.     ??? ezetimibe (ZETIA) 10 mg tablet Take 1 Tab by mouth daily. 90 Tab 1   ??? omeprazole (PRILOSEC) 40 mg capsule Take 1 Cap by mouth daily. 30 Cap 0   ??? ascorbic acid (VITAMIN C) 1,000 mg tablet Take  by mouth.     ??? VITAMIN E MIXED (NATURAL VITAMIN E PO) Take 400 Units by mouth. 3 x week      ??? dexamethasone (DECADRON) 0.5 mg tablet Take 1 Tab by mouth two (2) times daily (with meals). 20 Tab 0   ??? nystatin-triamcinolone (MYCOLOG) 100,000-0.1 unit/gram-% ointment APP AA BID PRN  0   ??? alendronate (FOSAMAX) 70 mg tablet TAKE 1 TABLET BY MOUTH EVERY 7 DAYS 12 Tab 6   ??? vitamin A 8,000 unit capsule Take 8,000 Units by mouth daily.          Objective:   Vitals:  Vitals:    02/03/16 1136  BP: 107/61   Pulse: (!) 103   Resp: 18   Temp: 98.4 ??F (36.9 ??C)   TempSrc: Oral   SpO2: 96%   Weight: 127 lb 9.6 oz (57.9 kg)   Height: 5\' 3"  (1.6 m)   PainSc:   6   PainLoc: Head               Lab Data Reviewed:  Lab Results   Component Value Date/Time    WBC 3.4 03/08/2015 09:03 AM    HCT 36.7 03/08/2015 09:03 AM    HGB 11.9 03/08/2015 09:03 AM    PLATELET 276 03/08/2015 09:03 AM       Lab Results   Component Value Date/Time    Sodium 135 12/16/2015 10:28 AM    Potassium 4.2 12/16/2015 10:28 AM    Chloride 96 12/16/2015 10:28 AM    CO2 24 12/16/2015 10:28 AM    Glucose 69 12/16/2015 10:28 AM    BUN 11 12/16/2015 10:28 AM    Creatinine 0.66 12/16/2015 10:28 AM    Calcium 9.5 12/16/2015 10:28 AM       No components found for: TROPQUANT    No results found for: ANA      No results found for: HBA1C, HGBE8, HBA1CPOC, HBA1CEXT, HBA1CEXT     Lab Results   Component Value Date/Time    Vitamin B12 1014 03/08/2015 09:03 AM       No results found for: ANA, Phillip Heal, XBANA    Lab Results    Component Value Date/Time    Cholesterol, total 176 08/10/2015 01:02 PM    HDL Cholesterol 74 08/10/2015 01:02 PM    LDL, calculated 90 08/10/2015 01:02 PM    VLDL, calculated 12 08/10/2015 01:02 PM    Triglyceride 60 08/10/2015 01:02 PM         CT Results (recent):    Results from Hospital Encounter encounter on 07/28/12   CT ABD W CONT   Narrative **Final Report**      ICD Codes / Adm.Diagnosis: 783.21  285.9 / Loss of weight  Anemia,   unspecified  Examination:  CT ABDOMEN W CON  - [id number] - Jul 28 2012 11:34AM  Accession No:  [id number]  Reason:  weight loss      REPORT:  INDICATION:  Weight loss, anemia.    COMPARISON:  05/12/2010 and 11/21/2007.    TECHNIQUE:  Contiguous CT images were obtained of the abdomen with and   delayed 100 mL of Optiray 350 contrast.  Oral contrast was administered as   well.  Coronal and sagittal reformatted images were then obtained.    CT ABDOMEN:  A stable few hepatic hypodensities/cysts the largest at the   lateral dome of the right of the liver measuring 1.2 cm are seen.  A stable   flash filling lesion at the posterior dome of the right lobe of the liver is   seen.  No new hepatic abnormality is appreciated.  The kidneys show   bilateral cysts larger and more numerous on the right. The spleen, pancreas,   and adrenal glands show a satisfactory appearance.  The abdomen aorta is   normal in caliber and shows atherosclerotic mural calcification.  The   gallbladder is normally distended.  The biliary ducts are not dilated.    There is no ascites. The lung bases are clear    Oral contrast in the distal esophagus suggesting delayed clearing or reflux   is present. The visualized small bowel is  normal in caliber. The visualized   colon shows fecal stasis.  Grade 1 spondylolisthesis, discogenic disease,   and facet arthropathy at L3-L4 and L4-5 are again seen.       IMPRESSION:  Stable hepatic hypodensities and stable hepatic flash filling   lesion.     Bilateral renal cysts, no hydronephrosis.    Grade 1 spondylolisthesis, discogenic disease, and degenerative change at   L3-L4 and L4-5.                                       Signing/Reading Doctor: Milus Glazier [phone])    Approved: Milus Glazier [id number])  Jul 28 2012  2:15PM                                MRI Results (recent):    Results from Hospital Encounter encounter on 07/07/11   MRI LUMB SPINE WO CONT   Narrative **Final Report**      ICD Codes / Adm.Diagnosis: 724.3  738.4 / Sciatica  Acquired   spondylolisthesis  Examination:  MR L SPINE WO CON  - [id number] - Jul 07 2011  2:13PM  Accession No:  [id number]  Reason:  SCIATICA      REPORT:  EXAM:  MR L SPINE WO CON    INDICATION: Low back and bilateral lower extremity pain    COMPARISON: Lumbar spine plain films on April 11, 2010    TECHNIQUE: Sagittal T1, T2, STIR and axial T1 and T2 noncontrast MRI of the   lumbar spine .    FINDINGS: Grade 1 anterolisthesis of L3-4 and L4-5 is unchanged.  Severe   facet arthrosis at these levels.  No definitive spondylolysis.  No new   subluxation.    Degenerative bone marrow signal is greatest at L4-5.  No marrow infiltrative   process. No compression deformity. Disc desiccation and vacuum disc   phenomenon are severe at L3-4 and L4-5.    Conus medullaris terminates at L1. Signal and caliber of the distal cord and   conus are within normal limits. Bilateral renal cystic lesions are partially   imaged..    T12-L1: No herniation or stenosis.    L1-L2: Diffuse disc bulge, facet arthrosis, and thickened ligamentum flavum.   No stenosis.    L2-L3: Diffuse disc bulge, facet arthrosis, and thickened ligamentum flavum.   Minimal central spinal canal stenosis.  Mild left foraminal stenosis.    L3-L4: Uncovered diffuse disc bulge and severe facet arthrosis.  Severe   central spinal canal stenosis.  Severe bilateral foraminal stenosis.    L4-L5: Uncovered diffuse disc bulge, facet arthrosis, and thickened    ligamentum flavum.  Moderate-severe central spinal canal stenosis.    Moderate-severe bilateral foraminal stenosis.    L5-S1: No herniation or stenosis.       IMPRESSION:  1.  L3-4 severe central spinal canal stenosis and bilateral foraminal   stenosis.  2.  L4-5 moderate-severe central spinal canal stenosis and bilateral   foraminal stenosis.  3.  Unchanged grade 1 anterolisthesis of L3-4 and L4-5 secondary to severe   central arthrosis.           Signing/Reading Doctor: Marilynne Drivers [phone])    Approved: Marilynne Drivers [phone])  Jul 09 2011  8:31AM  IR Results (recent):  No results found for this or any previous visit.    VAS/US Results (recent):    Results from Hospital Encounter encounter on 06/14/11   DUPLEX UPPER EXT VENOUS LEFT    PHYSICAL EXAM:  General:    Alert, cooperative, no distress, appears stated age.     Head:   Normocephalic, without obvious abnormality, atraumatic.  Eyes:   Conjunctivae/corneas clear.  PERRLA  Nose:  Nares normal. No drainage or sinus tenderness.  Throat:    Lips, mucosa, and tongue normal.  No Thrush  Neck:  Supple, symmetrical,  no adenopathy, thyroid: non tender    no carotid bruit and no JVD.  Back:    Symmetric,  No CVA tenderness.  Lungs:   Clear to auscultation bilaterally.  No Wheezing or Rhonchi. No rales.  Chest wall:  No tenderness or deformity. No Accessory muscle use.  Heart:   Regular rate and rhythm,  no murmur, rub or gallop.  Abdomen:   Soft, non-tender. Not distended.  Bowel sounds normal. No masses  Extremities: Extremities normal, atraumatic, No cyanosis.  No edema. No clubbing  Skin:     Texture, turgor normal. No rashes or lesions.  Not Jaundiced  Lymph nodes: Cervical, supraclavicular normal.  Psych:  Good insight.  Not depressed.  Not anxious or agitated.      NEUROLOGICAL EXAM:  Appearance:  The patient is well developed, well nourished, provides a coherent history and is in no acute distress.    Mental Status: Oriented to time, place and person. Mood and affect appropriate.   Cranial Nerves:   Intact visual fields. Fundi are benign. PERLA, EOM's full, no nystagmus, no ptosis. Facial sensation is normal. Corneal reflexes are intact. Facial movement is symmetric. Hearing is normal bilaterally. Palate is midline with normal sternocleidomastoid and trapezius muscles are normal. Tongue is midline.   Motor:  5/5 strength in upper and lower proximal and distal muscles. Normal bulk and tone. No fasciculations.   Reflexes:   Deep tendon reflexes 2+/4 and symmetrical.   Sensory:   Equivocal sensation to touch, pinprick and vibration.   Gait:  Unsteady gait.Ambulates with cane   Tremor:   No tremor noted.   Cerebellar:  No cerebellar signs present.   Neurovascular:  Normal heart sounds and regular rhythm, peripheral pulses intact, and no carotid bruits.         ___________________________________________________  PLAN:Medication reviewed with patient  Reglan 5mg  p.o. Tid  Will refer to GI if nausea continues  Will monitor headache and blurry vision, if continues, will refer to Neurosurgeon for Macroadenoma evaluation      ICD-10-CM ICD-9-CM    1. Pituitary adenoma ([place]) D35.2 227.3    2. Headache disorder R51 784.0    3. Paresthesia R20.2 782.0    4. Gait disorder R26.9 781.2      Follow-up Disposition:  Return in about 3 months (around 05/05/2016).         ___________________________________________________    Total time spent with patient:  15   25   35    __ minutes    Care Plan discussed with:    Patient   Family    Care Manager   Consultant/Specialist :    ___________________________________________________    Attending Physician: Nelwyn Salisbury, MD

## 2016-02-13 NOTE — Telephone Encounter (Signed)
Patient called to ask if she could travel to [place] and stay from Thursday to Monday. Previously, Dr. Cira Servant has asked the patient to stay close to home. Please advise!

## 2016-02-15 NOTE — Telephone Encounter (Signed)
Called and spoke with pt explained per doctor latham that he was ok with her traveling as long as she was not left unattended. Pt voiced an understanding. [name], LPN

## 2016-03-02 MED ORDER — GABAPENTIN 100 MG CAP
100 mg | ORAL_CAPSULE | ORAL | 0 refills | Status: DC
Start: 2016-03-02 — End: 2016-03-23

## 2016-03-20 NOTE — Telephone Encounter (Signed)
Patient has been having some pain.  Patient went to [facility] on yesterday.  They gave her some Tramadol but they only gave her only 10 pills. Patient thinks pain from back of shoulder is stimulating from the pituitary gland.

## 2016-03-22 NOTE — Telephone Encounter (Signed)
Left message to return call to the office.

## 2016-03-23 ENCOUNTER — Ambulatory Visit: Admit: 2016-03-23 | Discharge: 2016-03-23 | Payer: MEDICARE | Attending: Neurology | Primary: Family Medicine

## 2016-03-23 DIAGNOSIS — D497 Neoplasm of unspecified behavior of endocrine glands and other parts of nervous system: Secondary | ICD-10-CM

## 2016-03-23 MED ORDER — OTHER
3 refills | Status: DC
Start: 2016-03-23 — End: 2016-07-18

## 2016-03-23 MED ORDER — GABAPENTIN 300 MG CAP
300 mg | ORAL_CAPSULE | ORAL | 1 refills | Status: DC
Start: 2016-03-23 — End: 2017-07-22

## 2016-03-23 MED ORDER — GABAPENTIN 300 MG CAP
300 mg | ORAL_CAPSULE | Freq: Three times a day (TID) | ORAL | 1 refills | Status: DC
Start: 2016-03-23 — End: 2016-03-23

## 2016-03-23 MED ORDER — TRAMADOL 50 MG TAB
50 mg | ORAL_TABLET | Freq: Four times a day (QID) | ORAL | 1 refills | Status: DC | PRN
Start: 2016-03-23 — End: 2016-05-01

## 2016-03-23 NOTE — Progress Notes (Signed)
Chief Complaint   Patient presents with   ??? Headache   ??? Neck Pain     1. Have you been to the ER, urgent care clinic since your last visit?  Hospitalized since your last visit? [facility] 03/19/16    2. Have you seen or consulted any other health care providers outside of the [facility] since your last visit?  Include any pap smears or colon screening. [facility]

## 2016-03-23 NOTE — Progress Notes (Signed)
Neurology Progress Note    NAME:  Kimberly [name]   DOB:   1923-10-26   MRN:   [id number]     Date/Time:  04/06/2016  Subjective:      Kimberly [name] is a 82 y.o. female here today for follow up  Patient was accompanied by her niece, Ms Billey Co , to this visit  Says the pain is persistent, very sharp, one side of the  Head.  Headache is throbbing in nature and nearly daily, patient is worried that the meningioma would be causing such a pain, however, I reassured her abnd her care gievr tha it might not be the case.. Patient noted that the pain medication helps with the head pain.  Says she is seeing a foot doctor apparently for her gait , the doctor recommended AFO for both legs which the patient is wearing  She said that she has had no black out spells since the last visit.    Review of Systems:   Constitutional: positive for fatigue  Eyes: negative  Ears, nose, mouth, throat, and face: negative  Respiratory: negative  Cardiovascular: negative  Gastrointestinal: negative  Musculoskeletal:positive for myalgias, arthralgias, stiff joints, neck pain, back pain, muscle weakness and bone pain  Neurological: positive for headaches, dizziness, vertigo, memory problems, paresthesia, coordination problems, gait problems and weakness  Behavioral/Psych: negative  Endocrine: negative  Allergic/Immunologic: negative    Medications reviewed:  Current Outpatient Prescriptions   Medication Sig Dispense Refill   ??? mupirocin (BACTROBAN) 2 % ointment APP EXT AA TID  0   ??? traMADol (ULTRAM) 50 mg tablet Take 1 Tab by mouth every six (6) hours as needed for Pain. Max Daily Amount: 200 mg. 30 Tab 1   ??? OTHER Lidocaine 3% ointment  Indications: APPLY TO AFFECTED PART TID 100 g 3   ??? traMADol (ULTRAM) 50 mg tablet Take 1 Tab by mouth every six (6) hours as needed for Pain. Max Daily Amount: 200 mg. 30 Tab 1   ??? Walker misc 1 Units by Does Not Apply route daily. 1 Each 0    ??? isosorbide dinitrate (ISORDIL) 10 mg tablet TAKE 1 TABLET BY MOUTH TWICE DAILY 180 Tab 1   ??? acetaminophen (TYLENOL) 325 mg tablet Take 2 Tabs by mouth every eight (8) hours. 180 Tab 3   ??? oxybutynin (DITROPAN) 5 mg tablet TAKE 1 TABLET BY MOUTH TWICE DAILY 180 Tab 3   ??? simvastatin (ZOCOR) 5 mg tablet Take  by mouth nightly.     ??? polyethylene glycol (MIRALAX) 17 gram/dose powder TAKE 1 CAPFUL PO ONCE A DAY  11   ??? verapamil ER (CALAN-SR) 180 mg CR tablet Take 1 Tab by mouth nightly. 90 Tab 1   ??? alendronate (FOSAMAX) 70 mg tablet TAKE 1 TABLET BY MOUTH EVERY 7 DAYS 12 Tab 6   ??? aspirin 81 mg chewable tablet Take 81 mg by mouth daily.     ??? coenzyme q10-vitamin e (COQ10 SG 100) 100-100 mg-unit cap Take  by mouth.     ??? ezetimibe (ZETIA) 10 mg tablet Take 1 Tab by mouth daily. 90 Tab 1   ??? omeprazole (PRILOSEC) 40 mg capsule Take 1 Cap by mouth daily. 30 Cap 0   ??? ascorbic acid (VITAMIN C) 1,000 mg tablet Take  by mouth.     ??? VITAMIN E MIXED (NATURAL VITAMIN E PO) Take 400 Units by mouth. 3 x week      ??? vitamin A 8,000 unit capsule  Take 8,000 Units by mouth daily.     ??? cilostazol (PLETAL) 100 mg tablet TAKE 1 TABLET BY MOUTH TWICE DAILY ON EMPTY STOMACH 180 Tab 0   ??? OTHER Lidocaine  topica gel 3% apply three times to affected area 60 mL 0   ??? gabapentin (NEURONTIN) 300 mg capsule TAKE 1 CAPSULE BY MOUTH THREE TIMES DAILY 270 Cap 1   ??? dexamethasone (DECADRON) 0.5 mg tablet Take 1 Tab by mouth two (2) times daily (with meals). 20 Tab 0   ??? nystatin-triamcinolone (MYCOLOG) 100,000-0.1 unit/gram-% ointment APP AA BID PRN  0        Objective:   Vitals:  Vitals:    03/23/16 1346   BP: 121/74   Pulse: 88   Resp: 18   Temp: 98.9 ??F (37.2 ??C)   TempSrc: Temporal   SpO2: 97%   Weight: 129 lb (58.5 kg)   Height: 5\' 3"  (1.6 m)   PainSc:   5   PainLoc: Neck               Lab Data Reviewed:  Lab Results   Component Value Date/Time    WBC 3.4 03/08/2015 09:03 AM    HCT 36.7 03/08/2015 09:03 AM     HGB 11.9 03/08/2015 09:03 AM    PLATELET 276 03/08/2015 09:03 AM       Lab Results   Component Value Date/Time    Sodium 135 12/16/2015 10:28 AM    Potassium 4.2 12/16/2015 10:28 AM    Chloride 96 12/16/2015 10:28 AM    CO2 24 12/16/2015 10:28 AM    Glucose 69 12/16/2015 10:28 AM    BUN 11 12/16/2015 10:28 AM    Creatinine 0.66 12/16/2015 10:28 AM    Calcium 9.5 12/16/2015 10:28 AM       No components found for: TROPQUANT    No results found for: ANA      No results found for: HBA1C, HGBE8, HBA1CPOC, HBA1CEXT, HBA1CEXT     Lab Results   Component Value Date/Time    Vitamin B12 1014 03/08/2015 09:03 AM       No results found for: ANA, Phillip Heal, XBANA    Lab Results   Component Value Date/Time    Cholesterol, total 176 08/10/2015 01:02 PM    HDL Cholesterol 74 08/10/2015 01:02 PM    LDL, calculated 90 08/10/2015 01:02 PM    VLDL, calculated 12 08/10/2015 01:02 PM    Triglyceride 60 08/10/2015 01:02 PM         CT Results (recent):    Results from Hospital Encounter encounter on 07/28/12   CT ABD W CONT   Narrative **Final Report**      ICD Codes / Adm.Diagnosis: 783.21  285.9 / Loss of weight  Anemia,   unspecified  Examination:  CT ABDOMEN W CON  - [id number] - Jul 28 2012 11:34AM  Accession No:  [id number]  Reason:  weight loss      REPORT:  INDICATION:  Weight loss, anemia.    COMPARISON:  05/12/2010 and 11/21/2007.    TECHNIQUE:  Contiguous CT images were obtained of the abdomen with and   delayed 100 mL of Optiray 350 contrast.  Oral contrast was administered as   well.  Coronal and sagittal reformatted images were then obtained.    CT ABDOMEN:  A stable few hepatic hypodensities/cysts the largest at the   lateral dome of the right of the liver measuring 1.2 cm are seen.  A stable  flash filling lesion at the posterior dome of the right lobe of the liver is   seen.  No new hepatic abnormality is appreciated.  The kidneys show   bilateral cysts larger and more numerous on the right. The spleen, pancreas,    and adrenal glands show a satisfactory appearance.  The abdomen aorta is   normal in caliber and shows atherosclerotic mural calcification.  The   gallbladder is normally distended.  The biliary ducts are not dilated.    There is no ascites. The lung bases are clear    Oral contrast in the distal esophagus suggesting delayed clearing or reflux   is present. The visualized small bowel is normal in caliber. The visualized   colon shows fecal stasis.  Grade 1 spondylolisthesis, discogenic disease,   and facet arthropathy at L3-L4 and L4-5 are again seen.       IMPRESSION:  Stable hepatic hypodensities and stable hepatic flash filling   lesion.    Bilateral renal cysts, no hydronephrosis.    Grade 1 spondylolisthesis, discogenic disease, and degenerative change at   L3-L4 and L4-5.                                       Signing/Reading Doctor: Milus Glazier [phone])    Approved: Milus Glazier [id number])  Jul 28 2012  2:15PM                                MRI Results (recent):    Results from Hospital Encounter encounter on 07/07/11   MRI LUMB SPINE WO CONT   Narrative **Final Report**      ICD Codes / Adm.Diagnosis: 724.3  738.4 / Sciatica  Acquired   spondylolisthesis  Examination:  MR L SPINE WO CON  - [id number] - Jul 07 2011  2:13PM  Accession No:  [id number]  Reason:  SCIATICA      REPORT:  EXAM:  MR L SPINE WO CON    INDICATION: Low back and bilateral lower extremity pain    COMPARISON: Lumbar spine plain films on April 11, 2010    TECHNIQUE: Sagittal T1, T2, STIR and axial T1 and T2 noncontrast MRI of the   lumbar spine .    FINDINGS: Grade 1 anterolisthesis of L3-4 and L4-5 is unchanged.  Severe   facet arthrosis at these levels.  No definitive spondylolysis.  No new   subluxation.    Degenerative bone marrow signal is greatest at L4-5.  No marrow infiltrative   process. No compression deformity. Disc desiccation and vacuum disc   phenomenon are severe at L3-4 and L4-5.     Conus medullaris terminates at L1. Signal and caliber of the distal cord and   conus are within normal limits. Bilateral renal cystic lesions are partially   imaged..    T12-L1: No herniation or stenosis.    L1-L2: Diffuse disc bulge, facet arthrosis, and thickened ligamentum flavum.   No stenosis.    L2-L3: Diffuse disc bulge, facet arthrosis, and thickened ligamentum flavum.   Minimal central spinal canal stenosis.  Mild left foraminal stenosis.    L3-L4: Uncovered diffuse disc bulge and severe facet arthrosis.  Severe   central spinal canal stenosis.  Severe bilateral foraminal stenosis.    L4-L5: Uncovered diffuse disc bulge, facet arthrosis, and thickened   ligamentum  flavum.  Moderate-severe central spinal canal stenosis.    Moderate-severe bilateral foraminal stenosis.    L5-S1: No herniation or stenosis.       IMPRESSION:  1.  L3-4 severe central spinal canal stenosis and bilateral foraminal   stenosis.  2.  L4-5 moderate-severe central spinal canal stenosis and bilateral   foraminal stenosis.  3.  Unchanged grade 1 anterolisthesis of L3-4 and L4-5 secondary to severe   central arthrosis.           Signing/Reading Doctor: Marilynne Drivers [phone])    Approved: Marilynne Drivers [phone])  Jul 09 2011  8:31AM                                      IR Results (recent):  No results found for this or any previous visit.    VAS/US Results (recent):    Results from Hospital Encounter encounter on 06/14/11   DUPLEX UPPER EXT VENOUS LEFT    PHYSICAL EXAM:  General:    Alert, cooperative, no distress, appears stated age.     Head:   Normocephalic, without obvious abnormality, atraumatic.  Eyes:   Conjunctivae/corneas clear.  PERRLA  Nose:  Nares normal. No drainage or sinus tenderness.  Throat:    Lips, mucosa, and tongue normal.  No Thrush  Neck:  Supple, symmetrical,  no adenopathy, thyroid: non tender    no carotid bruit and no JVD.  Back:    Symmetric,  No CVA tenderness.   Lungs:   Clear to auscultation bilaterally.  No Wheezing or Rhonchi. No rales.  Chest wall:  No tenderness or deformity. No Accessory muscle use.  Heart:   Regular rate and rhythm,  no murmur, rub or gallop.  Abdomen:   Soft, non-tender. Not distended.  Bowel sounds normal. No masses  Extremities: Extremities normal, atraumatic, No cyanosis.  No edema. No clubbing  Skin:     Texture, turgor normal. No rashes or lesions.  Not Jaundiced  Lymph nodes: Cervical, supraclavicular normal.  Psych:  Good insight.  Not depressed.  Not anxious or agitated.      NEUROLOGICAL EXAM:  Appearance:  The patient is well developed, well nourished, provides a coherent history and is in no acute distress.   Mental Status: Oriented to time, place and person. Mood and affect appropriate.   Cranial Nerves:   Intact visual fields. Fundi are benign. PERLA, EOM's full, no nystagmus, no ptosis. Facial sensation is normal. Corneal reflexes are intact. Facial movement is symmetric. Hearing is normal bilaterally. Palate is midline with normal sternocleidomastoid and trapezius muscles are normal. Tongue is midline.   Motor:  5/5 strength in upper and 5-/5lower proximal and distal muscles. Normal bulk and tone. No fasciculations.   Reflexes:   Deep tendon reflexes 2+/4 and symmetrical.   Sensory:   Decreased sensation to touch, pinprick and vibration.   Gait:  Abnormal gait.Ambulates with cane   Tremor:   Mild tremor noted.   Cerebellar:  No cerebellar signs present.   Neurovascular:  Normal heart sounds and regular rhythm, peripheral pulses intact, and no carotid bruits.       .  ___________________________________________________  PLAN:Medication reviewed with patient  Increase neurontin to 300mg  p.o. Tid  Tramadol 50mg  p .o.prn for pain q8h  Xylocaine ointment 3%  Decadron 0.5 mg bid x 10 days.      ICD-10-CM ICD-9-CM    1. Pituitary tumor  D49.7 239.7    2. Paresthesia R20.2 782.0    3. Neuritis M79.2 729.2     4. Migraine without aura and without status migrainosus, not intractable G43.009 346.10    5. Gait disorder R26.9 781.2      Follow-up Disposition:  Return in about 3 months (around 06/23/2016).         ___________________________________________________    Total time spent with patient:  15   25   35    __ minutes    Care Plan discussed with:    Patient   Family    Care Manager   Consultant/Specialist :    ___________________________________________________    Attending Physician: Nelwyn Salisbury, MD

## 2016-03-23 NOTE — Telephone Encounter (Signed)
Patient has an appointment on 03/23/16.

## 2016-03-27 ENCOUNTER — Encounter

## 2016-03-27 MED ORDER — OTHER
0 refills | Status: DC
Start: 2016-03-27 — End: 2016-05-01

## 2016-03-27 NOTE — Telephone Encounter (Signed)
Fax received by [employer]. Lidocaine 3 % cream not covered. Called [facility] Lidocaine 2% gel is approved.

## 2016-04-02 MED ORDER — CILOSTAZOL 100 MG TAB
100 mg | ORAL_TABLET | ORAL | 0 refills | Status: DC
Start: 2016-04-02 — End: 2016-07-06

## 2016-04-06 NOTE — Telephone Encounter (Signed)
Niece is not listed on hipaa form.

## 2016-04-06 NOTE — Telephone Encounter (Signed)
-----   Message from Adrian Blackwater sent at 04/05/2016  7:08 PM EDT -----  Regarding: Dr. Cherene Altes  Patient's niece, Harold Hedge, called to confirm if the patient can resume chiropractic appointments. Also wants to know if she can go out of town. Please contact Mrs Sheryn Bison on [phone] or [phone].

## 2016-04-17 ENCOUNTER — Encounter: Attending: Internal Medicine | Primary: Family Medicine

## 2016-04-20 ENCOUNTER — Ambulatory Visit: Admit: 2016-04-20 | Discharge: 2016-04-20 | Payer: MEDICARE | Attending: Internal Medicine | Primary: Family Medicine

## 2016-04-20 DIAGNOSIS — M542 Cervicalgia: Secondary | ICD-10-CM

## 2016-04-20 MED ORDER — TRAMADOL 50 MG TAB
50 mg | ORAL_TABLET | Freq: Four times a day (QID) | ORAL | 1 refills | Status: DC | PRN
Start: 2016-04-20 — End: 2016-07-18

## 2016-04-20 MED ORDER — ACETAMINOPHEN 325 MG TABLET
325 mg | ORAL_TABLET | Freq: Three times a day (TID) | ORAL | 3 refills | Status: DC
Start: 2016-04-20 — End: 2018-02-05

## 2016-04-20 NOTE — Progress Notes (Signed)
Patient has multiple chronic medical issues including, arthritis, chronic leg pain, and PVD.  Patient has a personal care aid and does not drive. She requires in home evaluation and therapy.

## 2016-04-20 NOTE — Progress Notes (Signed)
Chief Complaint   Patient presents with   ??? Leg Pain     right leg pain.    ??? Head Pain   ??? Neck Pain     1. Have you been to the ER, urgent care clinic since your last visit?  Hospitalized since your last visit?No    2. Have you seen or consulted any other health care providers outside of the [place] since your last visit?  Include any pap smears or colon screening. No

## 2016-04-20 NOTE — Progress Notes (Signed)
Subjective:      Kimberly [name] is a 82 y.o. female who presents today for   Chief Complaint   Patient presents with   ??? Leg Pain     right leg pain.    ??? Head Pain   ??? Neck Pain     Patient c/o neck pain over last several months, denies stiffness. Hurts same on both sides, hurts when turns neck to left or right. She sometime s notices some tingling in her fingers.      Has burning watery eyes, positive visual change sees blue color in right eye. She carries a diagnosis of retinitis pigmentosa.  Has appt with ophthalmologist on oct 11.    Patient c/o right leg pain. Pain can be severe. She has seen vascular dopplers show no clots  Patient has chronic right leg pain. Has seen neurologist  has seen podiatrist Dr. Rob Hickman    Patient Active Problem List    Diagnosis Date Noted   ??? 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 Memory loss 11/21/2014   ??? Acute bacterial conjunctivitis of right eye 11/21/2014   ??? SOB (shortness of breath) on exertion 06/21/2014   ??? Chest pain with normal angiography--~ 15 yrs ago [facility]CV 06/21/2014   ??? Hypertension, essential, benign    ??? High cholesterol      Current Outpatient Prescriptions   Medication Sig Dispense Refill   ??? cilostazol (PLETAL) 100 mg tablet TAKE 1 TABLET BY MOUTH TWICE DAILY ON EMPTY STOMACH 180 Tab 0   ??? mupirocin (BACTROBAN) 2 % ointment APP EXT AA TID  0   ??? OTHER Lidocaine 3% ointment  Indications: APPLY TO AFFECTED PART TID 100 g 3   ??? gabapentin (NEURONTIN) 300 mg capsule TAKE 1 CAPSULE BY MOUTH THREE TIMES DAILY 270 Cap 1   ??? traMADol (ULTRAM) 50 mg tablet Take 1 Tab by mouth every six (6) hours as needed for Pain. Max Daily Amount: 200 mg. 30 Tab 1   ??? Walker misc 1 Units by Does Not Apply route daily. 1 Each 0   ??? isosorbide dinitrate (ISORDIL) 10 mg tablet TAKE 1 TABLET BY MOUTH TWICE DAILY 180 Tab 1   ??? oxybutynin (DITROPAN) 5 mg tablet TAKE 1 TABLET BY MOUTH TWICE DAILY 180 Tab 3   ??? simvastatin (ZOCOR) 5 mg tablet Take  by mouth nightly.      ??? verapamil ER (CALAN-SR) 180 mg CR tablet Take 1 Tab by mouth nightly. 90 Tab 1   ??? alendronate (FOSAMAX) 70 mg tablet TAKE 1 TABLET BY MOUTH EVERY 7 DAYS 12 Tab 6   ??? aspirin 81 mg chewable tablet Take 81 mg by mouth daily.     ??? coenzyme q10-vitamin e (COQ10 SG 100) 100-100 mg-unit cap Take  by mouth.     ??? ezetimibe (ZETIA) 10 mg tablet Take 1 Tab by mouth daily. 90 Tab 1   ??? omeprazole (PRILOSEC) 40 mg capsule Take 1 Cap by mouth daily. 30 Cap 0   ??? ascorbic acid (VITAMIN C) 1,000 mg tablet Take  by mouth.     ??? VITAMIN E MIXED (NATURAL VITAMIN E PO) Take 400 Units by mouth. 3 x week      ??? vitamin A 8,000 unit capsule Take 8,000 Units by mouth daily.     ??? lidocaine (XYLOCAINE) 2 % jelly APPLY TO AFFECTED PART TID  2   ??? OTHER Lidocaine  topica gel 3% apply three times to affected area 60 mL 0   ???  traMADol (ULTRAM) 50 mg tablet Take 1 Tab by mouth every six (6) hours as needed for Pain. Max Daily Amount: 200 mg. 30 Tab 1   ??? dexamethasone (DECADRON) 0.5 mg tablet Take 1 Tab by mouth two (2) times daily (with meals). 20 Tab 0   ??? acetaminophen (TYLENOL) 325 mg tablet Take 2 Tabs by mouth every eight (8) hours. 180 Tab 3   ??? polyethylene glycol (MIRALAX) 17 gram/dose powder TAKE 1 CAPFUL PO ONCE A DAY  11   ??? nystatin-triamcinolone (MYCOLOG) 100,000-0.1 unit/gram-% ointment APP AA BID PRN  0     Allergies   Allergen Reactions   ??? Pcn [Penicillins] Unknown (comments)   ??? Sulfa (Sulfonamide Antibiotics) Unknown (comments)     Past Medical History:   Diagnosis Date   ??? Anginal pain ([place])    ??? Arthritis    ??? Constipation    ??? Hearing loss    ??? Hearing loss    ??? High cholesterol    ??? Hypertension    ??? Incontinence    ??? Joint pain    ??? Memory disorder    ??? Muscle pain    ??? Ringing in the ears    ??? Snoring    ??? Visual disturbance      Past Surgical History:   Procedure Laterality Date   ??? HX HYSTERECTOMY     ??? HX ORTHOPAEDIC      epidural pain mtg     Family History   Problem Relation Age of Onset    ??? Breast Cancer Mother 23   ??? Breast Cancer Sister 61   ??? Breast Cancer Daughter 34   ??? Dementia Brother    ??? Dementia Sister      Social History   Substance Use Topics   ??? Smoking status: Never Smoker   ??? Smokeless tobacco: Never Used   ??? Alcohol use No        Review of Systems    A comprehensive review of systems was negative except for that written in the HPI.     Objective:     Visit Vitals   ??? BP 136/63 (BP 1 Location: Right arm, BP Patient Position: Sitting)   ??? Pulse 84   ??? Temp 98.2 ??F (36.8 ??C) (Oral)   ??? Resp 18   ??? Ht 5\' 3"  (1.6 m)     General:  Alert, cooperative, no distress, appears stated age.   Head:  Normocephalic, without obvious abnormality, atraumatic.   Eyes:  Conjunctivae/corneas clear. PERRL, EOMs intact. Fundi benign.   Ears:  Normal TMs and external ear canals both ears.   Nose: Nares normal. Septum midline. Mucosa normal. No drainage or sinus tenderness.   Throat: Lips, mucosa, and tongue normal. Teeth and gums normal.   Neck: Supple, symmetrical, trachea midline, no adenopathy, thyroid: no enlargement/tenderness/nodules, no carotid bruit and no JVD.   Back:   Symmetric, no curvature. ROM normal. No CVA tenderness.   Lungs:   Clear to auscultation bilaterally.   Chest wall:  No tenderness or deformity.   Heart:  Regular rate and rhythm, S1, S2 normal, no murmur, click, rub or gallop.   Abdomen:   Soft, non-tender. Bowel sounds normal. No masses,  No organomegaly.   Extremities: Edema right knee, questionable effusion, positive crepitus, RLE>LLE   Pulses: 2+ and symmetric all extremities.   Skin: Skin color, texture, turgor normal. No rashes or lesions.   Lymph nodes: Cervical, supraclavicular, and axillary nodes normal.  Neurologic: CNII-XII intact. Normal strength, sensation and reflexes throughout.       Assessment/Plan:       ICD-10-CM ICD-9-CM    1. Neck pain  Probable osteoarthritis  Cervical stenosis M54.2 723.1 REFERRAL TO HOME HEALTH    Xray cervical spine   Tylenol 325 take 2 tabs po q 8 hrs  Tramadol 50 mg q 6 hrs as needed for more severe pain   2. Headache, unspecified headache type   R51 784.0    3. Pain of right lower extremity M79.604 729.5 REFERRAL TO ORTHOPEDICS      DUPLEX LOWER EXT VENOUS RIGHT   4. Swelling of right knee joint M25.461 719.06 REFERRAL TO ORTHOPEDICS      DUPLEX LOWER EXT VENOUS RIGHT       Follow-up Disposition: Not on File   Advised her to call back or return to office if symptoms worsen/change/persist.  Discussed expected course/resolution/complications of diagnosis in detail with patient.    Medication risks/benefits/costs/interactions/alternatives discussed with patient.  She was given an after visit summary which includes diagnoses, current medications, & vitals.  She expressed understanding with the diagnosis and plan.

## 2016-04-23 ENCOUNTER — Ambulatory Visit: Payer: MEDICARE | Primary: Family Medicine

## 2016-04-24 ENCOUNTER — Inpatient Hospital Stay: Admit: 2016-04-24 | Payer: MEDICARE | Attending: Internal Medicine | Primary: Family Medicine

## 2016-04-24 DIAGNOSIS — M25462 Effusion, left knee: Secondary | ICD-10-CM

## 2016-04-26 ENCOUNTER — Telehealth

## 2016-04-26 ENCOUNTER — Encounter: Primary: Family Medicine

## 2016-04-26 NOTE — Telephone Encounter (Signed)
Linus Orn from [facility] called to verify if Dr. Cira Servant wanted the patient to have a xray of cervical spine? If so, please place the order. Patient arrived at [facility] ([place]) today but no order was in the system. Please notify patient when this is completed. Thanks!

## 2016-04-30 ENCOUNTER — Telehealth

## 2016-04-30 NOTE — Telephone Encounter (Signed)
[phone]  Ms. Inda Merlin would like to know if Dr. Cira Servant put in an order for home health physical therapy.

## 2016-05-01 NOTE — Telephone Encounter (Signed)
V.O.R.B given by Dr. Cira Servant to replace [facility] consult as it was older than 5 days. [name], LPN

## 2016-05-03 ENCOUNTER — Encounter

## 2016-05-03 ENCOUNTER — Inpatient Hospital Stay: Admit: 2016-05-03 | Payer: MEDICARE | Primary: Family Medicine

## 2016-05-03 DIAGNOSIS — M8580 Other specified disorders of bone density and structure, unspecified site: Secondary | ICD-10-CM

## 2016-05-04 NOTE — Telephone Encounter (Signed)
This patient is homebound due to chronic medical issues. She has multiple chronic medical problems including PVD and severe arthritis. She does not drive. At this time she requires a home care nursing aid.

## 2016-05-10 NOTE — Telephone Encounter (Signed)
Kimberly [name] Kimberly [name]) called to follow up on the aunt's Home Health order. Ms. Kimberly [name] was informed that more documentation was needed to have the insurance company cover the services. Why is services wanted in the home vs going to a facility? Ms. Kimberly [name] replied that her aunt is elderly and it would be much better if she received PT in the home for neck/leg pain where it is comfortable. If there anyway the office can find out what type of information is needed for an approval and get back with Kimberly [name]? Please review the patient's chart and contact Kimberly [name] at [phone]. Thanks!

## 2016-05-11 NOTE — Telephone Encounter (Signed)
Kimberly [name] called and stated she needed to speak to someone about an order for physical therapy.

## 2016-05-11 NOTE — Telephone Encounter (Signed)
Returned call to [employer] with [facility]. Verbal order per Dr. Cira Servant for patient to start home care in 05/14/16. Glyn Ade, LPN

## 2016-05-12 ENCOUNTER — Encounter: Admit: 2016-05-12 | Discharge: 2016-05-12 | Payer: MEDICARE | Primary: Family Medicine

## 2016-05-13 NOTE — Telephone Encounter (Signed)
Tanya I see a note in her chart from home Health. Has this been taken care of? I added the addendum to her last ov with me.

## 2016-05-14 ENCOUNTER — Ambulatory Visit: Admit: 2016-05-14 | Payer: MEDICARE | Attending: Medical | Primary: Family Medicine

## 2016-05-14 ENCOUNTER — Encounter: Payer: MEDICARE | Primary: Family Medicine

## 2016-05-14 DIAGNOSIS — R300 Dysuria: Secondary | ICD-10-CM

## 2016-05-14 LAB — AMB POC URINALYSIS DIP STICK AUTO W/O MICRO
Bilirubin (UA POC): NEGATIVE
Blood (UA POC): NEGATIVE
Glucose (UA POC): NEGATIVE
Ketones (UA POC): NEGATIVE
Nitrites (UA POC): NEGATIVE
Protein (UA POC): NEGATIVE mg/dL
Specific gravity (UA POC): 1.015 (ref 1.001–1.035)
Urobilinogen (UA POC): 1 (ref 0.2–1)
pH (UA POC): 7 (ref 4.6–8.0)

## 2016-05-14 MED ORDER — HYDROCORTISONE ACETATE 1 % TOPICAL CREAM
1 % | Freq: Two times a day (BID) | CUTANEOUS | 0 refills | Status: DC
Start: 2016-05-14 — End: 2016-07-18

## 2016-05-14 NOTE — Progress Notes (Signed)
HISTORY OF PRESENT ILLNESS  Kimberly [name] is a 82 y.o. female.  Chief Complaint   Patient presents with   ??? Urinary Pain     Patient states that when ever she urinates she has burning     HPI  1) Burning in vaginal area with urinating this week. No pain deep in pelvis. No vaginal discharge. Not sexually active. Has recently started wearing scented panty liners "to keep things fresh". Denies urinary leakage or vaginal discharge to warrant the use of panty liners. No other change in toiletries.     2) Pt questions if it would be ok to return to chiropractor? 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     Review of Systems   Constitutional: Negative for fever.   Gastrointestinal: Negative for abdominal pain.   Genitourinary: Positive for dysuria. Negative for flank pain, frequency, hematuria and urgency.       Physical Exam   Constitutional: She is oriented to person, place, and time. She appears well-developed and well-nourished. No distress.   HENT:   Head: Normocephalic and atraumatic.   Neck: Neck supple. No JVD present.   Cardiovascular: Normal rate, regular rhythm and normal heart sounds.    Pulmonary/Chest: Effort normal and breath sounds normal. No respiratory distress.   Genitourinary: No vaginal discharge found.   Genitourinary Comments: External genitalia including inner and outer labia slightly red and atrophic. Nontender.    Musculoskeletal: She exhibits no edema.   Neurological: She is alert and oriented to person, place, and time.   Skin: Skin is warm and dry.   Psychiatric: She has a normal mood and affect. Her behavior is normal. Judgment and thought content normal.   Nursing note and vitals reviewed.      ASSESSMENT and PLAN    ICD-10-CM ICD-9-CM    1. Dysuria R30.0 788.1 AMB POC URINALYSIS DIP STICK AUTO W/O MICRO   2. Acute vaginitis N76.0 616.10 Suspect this was brought on by scented panty liners. Instructed pt to stop wearing them. Pt agrees.      Apply hydrocortisone acetate 1 % topical cream to external genitalia BID x 2-7 days until sx resolve      NUSWAB VAGINITIS     Re: chiropractor: I believe that pt would benefit from returning to the chiropractor for neck arthritis, and this is not contraindicated by last winter's slightly abnormal brain scan. Pt and her Niece note understanding. I will confirm this with Dr. Cira Servant.

## 2016-05-14 NOTE — Progress Notes (Signed)
Room 7    Chief Complaint   Patient presents with   ??? Urinary Pain     Patient states that when ever she urinates she has burning     1. Have you been to the ER, urgent care clinic since your last visit?  Hospitalized since your last visit?No    2. Have you seen or consulted any other health care providers outside of the [facility] since your last visit?  Include any pap smears or colon screening. No    Health Maintenance Due   Topic Date Due   ??? ZOSTER VACCINE AGE 80>  08/13/1985   ??? GLAUCOMA SCREENING Q2Y  10/12/1990   ??? Pneumococcal 65+ Low/Medium Risk (2 of 2 - PCV13) 08/19/2015   ??? INFLUENZA AGE 35 TO ADULT  03/06/2016

## 2016-05-14 NOTE — Telephone Encounter (Signed)
Per chart record [facility] PT has started. [name], LPN

## 2016-05-15 ENCOUNTER — Encounter: Admit: 2016-05-15 | Discharge: 2016-05-15 | Payer: MEDICARE | Primary: Family Medicine

## 2016-05-15 ENCOUNTER — Encounter: Attending: Medical | Primary: Family Medicine

## 2016-05-17 ENCOUNTER — Encounter: Primary: Family Medicine

## 2016-05-18 ENCOUNTER — Telehealth

## 2016-05-18 ENCOUNTER — Encounter: Admit: 2016-05-18 | Discharge: 2016-05-18 | Payer: MEDICARE | Primary: Family Medicine

## 2016-05-18 LAB — NUSWAB VAGINITIS
C. albicans, NAA: NEGATIVE
C. glabrata, NAA: NEGATIVE
T. vaginalis, NAA: NEGATIVE

## 2016-05-18 MED ORDER — CLINDAMYCIN 2 % VAGINAL CREAM
2 % | VAGINAL | 0 refills | Status: DC
Start: 2016-05-18 — End: 2016-05-28

## 2016-05-18 NOTE — Telephone Encounter (Signed)
Called pt. BV test borderline positive. Pt is still somewhat symptomatic. Advised her that I will eRx antibacterial cream. Pt agrees.

## 2016-05-20 MED ORDER — GABAPENTIN 300 MG CAP
300 mg | ORAL_CAPSULE | ORAL | 0 refills | Status: DC
Start: 2016-05-20 — End: 2016-05-28

## 2016-05-21 ENCOUNTER — Ambulatory Visit: Admit: 2016-05-21 | Discharge: 2016-05-21 | Payer: MEDICARE | Attending: Internal Medicine | Primary: Family Medicine

## 2016-05-21 NOTE — Progress Notes (Deleted)
Subjective:      Kimberly [name] is a 82 y.o. female who presents today for No chief complaint on file.        Patient Active Problem List    Diagnosis Date Noted   ??? 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 Memory loss 11/21/2014   ??? Acute bacterial conjunctivitis of right eye 11/21/2014   ??? SOB (shortness of breath) on exertion 06/21/2014   ??? Chest pain with normal angiography--~ 15 yrs ago MCV 06/21/2014   ??? Hypertension, essential, benign    ??? High cholesterol      Current Outpatient Prescriptions   Medication Sig Dispense Refill   ??? gabapentin (NEURONTIN) 300 mg capsule TAKE 1 CAPSULE BY MOUTH THREE TIMES DAILY 90 Cap 0   ??? clindamycin (CLEOCIN) 2 % vaginal cream Apply to inner labia nightly for 7 days. 40 g 0   ??? cholecalciferol (VITAMIN D3) 1,000 unit cap Take  by mouth daily.     ??? hydrocortisone acetate 1 % topical cream Apply  to affected area two (2) times a day. 30 g 0   ??? acetaminophen (TYLENOL) 325 mg tablet Take 1 Tab by mouth three (3) times daily. 180 Tab 3   ??? traMADol (ULTRAM) 50 mg tablet Take 1 Tab by mouth every six (6) hours as needed for Pain. Max Daily Amount: 200 mg. 30 Tab 1   ??? cilostazol (PLETAL) 100 mg tablet TAKE 1 TABLET BY MOUTH TWICE DAILY ON EMPTY STOMACH 180 Tab 0   ??? mupirocin (BACTROBAN) 2 % ointment APP EXT AA TID  0   ??? OTHER Lidocaine 3% ointment  Indications: APPLY TO AFFECTED PART TID 100 g 3   ??? gabapentin (NEURONTIN) 300 mg capsule TAKE 1 CAPSULE BY MOUTH THREE TIMES DAILY 270 Cap 1   ??? Walker misc 1 Units by Does Not Apply route daily. 1 Each 0   ??? isosorbide dinitrate (ISORDIL) 10 mg tablet TAKE 1 TABLET BY MOUTH TWICE DAILY 180 Tab 1   ??? oxybutynin (DITROPAN) 5 mg tablet TAKE 1 TABLET BY MOUTH TWICE DAILY 180 Tab 3   ??? simvastatin (ZOCOR) 5 mg tablet Take  by mouth nightly.     ??? polyethylene glycol (MIRALAX) 17 gram/dose powder TAKE 1 CAPFUL PO ONCE A DAY  11   ??? nystatin-triamcinolone (MYCOLOG) 100,000-0.1 unit/gram-% ointment APP AA BID PRN  0    ??? verapamil ER (CALAN-SR) 180 mg CR tablet Take 1 Tab by mouth nightly. 90 Tab 1   ??? alendronate (FOSAMAX) 70 mg tablet TAKE 1 TABLET BY MOUTH EVERY 7 DAYS 12 Tab 6   ??? aspirin 81 mg chewable tablet Take 81 mg by mouth daily.     ??? coenzyme q10-vitamin e (COQ10 SG 100) 100-100 mg-unit cap Take  by mouth.     ??? ezetimibe (ZETIA) 10 mg tablet Take 1 Tab by mouth daily. 90 Tab 1   ??? omeprazole (PRILOSEC) 40 mg capsule Take 1 Cap by mouth daily. 30 Cap 0   ??? ascorbic acid (VITAMIN C) 1,000 mg tablet Take  by mouth.     ??? VITAMIN E MIXED (NATURAL VITAMIN E PO) Take 400 Units by mouth. 3 x week      ??? vitamin A 8,000 unit capsule Take 8,000 Units by mouth daily.       Allergies   Allergen Reactions   ??? Pcn [Penicillins] Unknown (comments)   ??? Sulfa (Sulfonamide Antibiotics) Unknown (comments)     Past Medical History:  Diagnosis Date   ??? Anginal pain ([place])    ??? Arthritis    ??? Constipation    ??? Hearing loss    ??? Hearing loss    ??? High cholesterol    ??? Hypertension    ??? Incontinence    ??? Joint pain    ??? Memory disorder    ??? Muscle pain    ??? Ringing in the ears    ??? Snoring    ??? Visual disturbance      Past Surgical History:   Procedure Laterality Date   ??? HX HYSTERECTOMY     ??? HX ORTHOPAEDIC      epidural pain mtg     Family History   Problem Relation Age of Onset   ??? Breast Cancer Mother 41   ??? Breast Cancer Sister 70   ??? Breast Cancer Daughter 79   ??? Dementia Brother    ??? Dementia Sister      Social History   Substance Use Topics   ??? Smoking status: Never Smoker   ??? Smokeless tobacco: Never Used   ??? Alcohol use No        Review of Systems    {ROS - Complete:30496}     Objective:     There were no vitals taken for this visit.  General:  Alert, cooperative, no distress, appears stated age.   Head:  Normocephalic, without obvious abnormality, atraumatic.   Eyes:  Conjunctivae/corneas clear. PERRL, EOMs intact. Fundi benign.   Ears:  Normal TMs and external ear canals both ears.    Nose: Nares normal. Septum midline. Mucosa normal. No drainage or sinus tenderness.   Throat: Lips, mucosa, and tongue normal. Teeth and gums normal.   Neck: Supple, symmetrical, trachea midline, no adenopathy, thyroid: no enlargement/tenderness/nodules, no carotid bruit and no JVD.   Back:   Symmetric, no curvature. ROM normal. No CVA tenderness.   Lungs:   Clear to auscultation bilaterally.   Chest wall:  No tenderness or deformity.   Heart:  Regular rate and rhythm, S1, S2 normal, no murmur, click, rub or gallop.   Abdomen:   Soft, non-tender. Bowel sounds normal. No masses,  No organomegaly.   Extremities: Extremities normal, atraumatic, no cyanosis or edema.   Pulses: 2+ and symmetric all extremities.   Skin: Skin color, texture, turgor normal. No rashes or lesions.   Lymph nodes: Cervical, supraclavicular, and axillary nodes normal.   Neurologic: CNII-XII intact. Normal strength, sensation and reflexes throughout.       Assessment/Plan:   {ASSESSMENT/PLAN:19072}    Follow-up Disposition: Not on File   Advised her to call back or return to office if symptoms worsen/change/persist.  Discussed expected course/resolution/complications of diagnosis in detail with patient.    Medication risks/benefits/costs/interactions/alternatives discussed with patient.  She was given an after visit summary which includes diagnoses, current medications, & vitals.  She expressed understanding with the diagnosis and plan.

## 2016-05-22 ENCOUNTER — Encounter: Admit: 2016-05-22 | Discharge: 2016-05-22 | Payer: MEDICARE | Primary: Family Medicine

## 2016-05-23 ENCOUNTER — Inpatient Hospital Stay
Admit: 2016-05-23 | Discharge: 2016-05-23 | Disposition: A | Discharge status: Home / Self Care | Payer: MEDICARE | Attending: Emergency Medicine

## 2016-05-23 ENCOUNTER — Emergency Department: Admit: 2016-05-23 | Payer: MEDICARE | Primary: Family Medicine

## 2016-05-23 ENCOUNTER — Emergency Department

## 2016-05-23 DIAGNOSIS — R51 Headache: Secondary | ICD-10-CM

## 2016-05-23 LAB — CBC WITH AUTOMATED DIFF
ABS. BASOPHILS: 0 10*3/uL (ref 0.0–0.1)
ABS. EOSINOPHILS: 0.1 10*3/uL (ref 0.0–0.4)
ABS. LYMPHOCYTES: 1.1 10*3/uL (ref 0.8–3.5)
ABS. MONOCYTES: 0.4 10*3/uL (ref 0.0–1.0)
ABS. NEUTROPHILS: 2.7 10*3/uL (ref 1.8–8.0)
BASOPHILS: 1 % (ref 0–1)
EOSINOPHILS: 3 % (ref 0–7)
HCT: 34.9 % — ABNORMAL LOW (ref 35.0–47.0)
HGB: 11.1 g/dL — ABNORMAL LOW (ref 11.5–16.0)
LYMPHOCYTES: 26 % (ref 12–49)
MCH: 23.1 PG — ABNORMAL LOW (ref 26.0–34.0)
MCHC: 31.8 g/dL (ref 30.0–36.5)
MCV: 72.6 FL — ABNORMAL LOW (ref 80.0–99.0)
MONOCYTES: 9 % (ref 5–13)
NEUTROPHILS: 61 % (ref 32–75)
PLATELET: 210 10*3/uL (ref 150–400)
RBC: 4.81 M/uL (ref 3.80–5.20)
RDW: 15.2 % — ABNORMAL HIGH (ref 11.5–14.5)
WBC: 4.4 10*3/uL (ref 3.6–11.0)

## 2016-05-23 LAB — METABOLIC PANEL, BASIC
Anion gap: 5 mmol/L (ref 5–15)
BUN/Creatinine ratio: 18 (ref 12–20)
BUN: 13 MG/DL (ref 6–20)
CO2: 26 mmol/L (ref 21–32)
Calcium: 9.2 MG/DL (ref 8.5–10.1)
Chloride: 104 mmol/L (ref 97–108)
Creatinine: 0.73 MG/DL (ref 0.55–1.02)
GFR est AA: >60 mL/min/{1.73_m2} (ref >60)
GFR est non-AA: >60 mL/min/{1.73_m2} (ref >60)
Glucose: 62 mg/dL — ABNORMAL LOW (ref 65–100)
Potassium: 3.9 mmol/L (ref 3.5–5.1)
Sodium: 135 mmol/L — ABNORMAL LOW (ref 136–145)

## 2016-05-23 LAB — SED RATE (ESR): Sed rate, automated: 22 mm/hr (ref 0–30)

## 2016-05-23 MED ORDER — DIPHENHYDRAMINE HCL 50 MG/ML IJ SOLN
50 mg/mL | INTRAMUSCULAR | Status: AC
Start: 2016-05-23 — End: 2016-05-23
  Administered 2016-05-23: 20:00:00 via INTRAVENOUS

## 2016-05-23 MED ORDER — DEXAMETHASONE SODIUM PHOSPHATE 4 MG/ML IJ SOLN
4 mg/mL | INTRAMUSCULAR | Status: AC
Start: 2016-05-23 — End: 2016-05-23
  Administered 2016-05-23: 20:00:00 via INTRAVENOUS

## 2016-05-23 MED ORDER — METOCLOPRAMIDE 5 MG/ML IJ SOLN
5 mg/mL | INTRAMUSCULAR | Status: AC
Start: 2016-05-23 — End: 2016-05-23
  Administered 2016-05-23: 20:00:00 via INTRAVENOUS

## 2016-05-23 MED FILL — DIPHENHYDRAMINE HCL 50 MG/ML IJ SOLN: 50 mg/mL | INTRAMUSCULAR | Qty: 1

## 2016-05-23 MED FILL — DEXAMETHASONE SODIUM PHOSPHATE 4 MG/ML IJ SOLN: 4 mg/mL | INTRAMUSCULAR | Qty: 3

## 2016-05-23 MED FILL — METOCLOPRAMIDE 5 MG/ML IJ SOLN: 5 mg/mL | INTRAMUSCULAR | Qty: 2

## 2016-05-23 NOTE — ED Notes (Signed)
MD has reviewed discharge instructions with the patient.  The patient verbalized understanding. Pt ambulatory home with family.

## 2016-05-23 NOTE — ED Notes (Signed)
Patient presents to ED with c/o headache x1 week.  Patient reports that she has been taking tylenol at home with no relief.  Hx of pituitary tumor.

## 2016-05-23 NOTE — ED Provider Notes (Signed)
[facility]  EMERGENCY DEPARTMENT HISTORY AND PHYSICAL EXAM       Date of Service: 05/23/2016   [name]   Date of Birth: [birth year]  Medical Record Number: [id number]    History of Presenting Illness     Chief Complaint   Patient presents with   ??? Headache     Per family pt with hx of pituitary gland tumor Pt family reports headache that have increased        History Provided By:  patient and family    Additional History:   Kimberly [name] is a 82 y.o. female with PMhx significant for hypertension, migraines, and pituitary gland tumor, who presents ambulatory to the ED with cc of intermittent episodes of a diffuse "tightness" headache x 1 week. Pt notes an associated symptom of photophobia. Per family pt has frequent headaches since her diagnosis of the tumor in 10/2015, however this past week they have been more "intense". Pt reports the headache was originally a 8/10 however has come down to 6/10. She notes that she has taken tylenol without relief. Pt specifically denies any fatigue or worsening pain while eating. Of note pt's niece lives with her. Pt denies nausea, vomiting, any acute vision changes, fever, chills, cough, chest pain, SOB, or any exacerbating/modifying factors.       Social Hx: - Tobacco, - EtOH, - Illicit Drugs    There are no other complaints, changes or physical findings at this time.    Primary Care Provider: Darlys Gales, MD   Specialist: Dr. Verdia Kuba - Neurologist    Past History     Past Medical History:   Past Medical History:   Diagnosis Date   ??? Anginal pain ([place])    ??? Arthritis    ??? Constipation    ??? Hearing loss    ??? Hearing loss    ??? High cholesterol    ??? Hypertension    ??? Incontinence    ??? Joint pain    ??? Memory disorder    ??? Muscle pain    ??? Ringing in the ears    ??? Snoring    ??? Visual disturbance         Past Surgical History:   Past Surgical History:   Procedure Laterality Date   ??? HX HYSTERECTOMY     ??? HX ORTHOPAEDIC       epidural pain mtg        Family History:   Family History   Problem Relation Age of Onset   ??? Breast Cancer Mother 80   ??? Breast Cancer Sister 39   ??? Breast Cancer Daughter 13   ??? Dementia Brother    ??? Dementia Sister         Social History:   Social History   Substance Use Topics   ??? Smoking status: Never Smoker   ??? Smokeless tobacco: Never Used   ??? Alcohol use No        Allergies:   Allergies   Allergen Reactions   ??? Pcn [Penicillins] Unknown (comments)   ??? Sulfa (Sulfonamide Antibiotics) Unknown (comments)         Review of Systems   Review of Systems   Constitutional: Negative for chills and fever.   HENT: Negative for congestion and sore throat.    Eyes: Positive for photophobia. Negative for visual disturbance.   Respiratory: Negative for cough and shortness of breath.    Cardiovascular: Negative  for chest pain and leg swelling.   Gastrointestinal: Negative for abdominal pain, blood in stool, diarrhea, nausea and vomiting.   Endocrine: Negative for polyuria.   Genitourinary: Negative for dysuria, flank pain, vaginal bleeding and vaginal discharge.   Musculoskeletal: Negative for myalgias.   Skin: Negative for rash.   Allergic/Immunologic: Negative for immunocompromised state.   Neurological: Positive for headaches ("tightness"). Negative for weakness.   Psychiatric/Behavioral: Negative for confusion.       Physical Exam  Physical Exam   Constitutional: She is oriented to person, place, and time. She appears well-developed and well-nourished.   HENT:   Head: Normocephalic and atraumatic.   Mouth/Throat: Oropharynx is clear and moist.   No tenderness over temporal artery   Eyes: Conjunctivae and EOM are normal. Pupils are equal, round, and reactive to light.   Neck: Normal range of motion. Neck supple. No tracheal deviation present.   Cardiovascular: Normal rate, regular rhythm, normal heart sounds and intact distal pulses.    No murmur heard.   Pulmonary/Chest: Effort normal and breath sounds normal. No respiratory distress. She has no wheezes. She has no rales.   Abdominal: Soft. Bowel sounds are normal. There is no tenderness. There is no rebound and no guarding.   Musculoskeletal: She exhibits no edema or deformity.   Neurological: She is alert and oriented to person, place, and time. GCS eye subscore is 4. GCS verbal subscore is 5. GCS motor subscore is 6.   EOMI intact, no facial droop or asymmetry, normal/equal sensation in face. Uvula elevates at midline, no tongue deviation. Normal strength with head rotation and shoulder shrug.  5/5 Strength in the bilateral upper and lower extremities, no pronotor drift, normal finger to nose. No truncal ataxia. Normal speech: no dysarthria or aphasia.   Skin: Skin is warm and dry.   Psychiatric: She has a normal mood and affect. Her speech is normal.   Nursing note and vitals reviewed.      Medical Decision Making   I am the first provider for this patient.     I reviewed the vital signs, available nursing notes, past medical history, past surgical history, family history and social history.       Provider Notes:   Patient presents with headache.  DDx: migraine, cluster HA, tension HA, dehydration.  Less likely pseudotumor cerebri, SAH, ICH, cerebral dural venous thrombosis, or meningitis given the course, story, time frame and physical exam.  Analgesics ordered. Will obtain ct given age, no need for LP at this time. Patient has a history of headaches and this is likely an exacerbation of her prior headache problems.       ED Course:  2:33 PM   Initial assessment performed. The patients presenting problems have been discussed, and they are in agreement with the care plan formulated and outlined with them.  I have encouraged them to ask questions as they arise throughout their visit.    Progress Notes:   5:02 PM  Pt's headache has resolved. Stable for discharge to home.       Diagnostic Study Results     Labs -       Recent Results (from the past 12 hour(s))   CBC WITH AUTOMATED DIFF    Collection Time: 05/23/16  3:54 PM   Result Value Ref Range    WBC 4.4 3.6 - 11.0 K/uL    RBC 4.81 3.80 - 5.20 M/uL    HGB 11.1 (L) 11.5 - 16.0 g/dL  HCT 34.9 (L) 35.0 - 47.0 %    MCV 72.6 (L) 80.0 - 99.0 FL    MCH 23.1 (L) 26.0 - 34.0 PG    MCHC 31.8 30.0 - 36.5 g/dL    RDW 15.2 (H) 11.5 - 14.5 %    PLATELET 210 150 - 400 K/uL    NEUTROPHILS 61 32 - 75 %    LYMPHOCYTES 26 12 - 49 %    MONOCYTES 9 5 - 13 %    EOSINOPHILS 3 0 - 7 %    BASOPHILS 1 0 - 1 %    ABS. NEUTROPHILS 2.7 1.8 - 8.0 K/UL    ABS. LYMPHOCYTES 1.1 0.8 - 3.5 K/UL    ABS. MONOCYTES 0.4 0.0 - 1.0 K/UL    ABS. EOSINOPHILS 0.1 0.0 - 0.4 K/UL    ABS. BASOPHILS 0.0 0.0 - 0.1 K/UL   SED RATE (ESR)    Collection Time: 05/23/16  3:54 PM   Result Value Ref Range    Sed rate, automated 22 0 - 30 mm/hr   METABOLIC PANEL, BASIC    Collection Time: 05/23/16  3:54 PM   Result Value Ref Range    Sodium 135 (L) 136 - 145 mmol/L    Potassium 3.9 3.5 - 5.1 mmol/L    Chloride 104 97 - 108 mmol/L    CO2 26 21 - 32 mmol/L    Anion gap 5 5 - 15 mmol/L    Glucose 62 (L) 65 - 100 mg/dL    BUN 13 6 - 20 MG/DL    Creatinine 0.73 0.55 - 1.02 MG/DL    BUN/Creatinine ratio 18 12 - 20      GFR est AA >60 >60 ml/min/1.54m    GFR est non-AA >60 >60 ml/min/1.782m   Calcium 9.2 8.5 - 10.1 MG/DL       Radiologic Studies -  The following have been ordered and reviewed:  CT HEAD WO CONT   Final Result        CT Results  (Last 48 hours)               05/23/16 1544  CT HEAD WO CONT Final result    Impression:   impression: No acute findings. Incidental pituitary macroadenoma.       Narrative:  EXAM:  CT HEAD WO CONT       INDICATION:   headache       COMPARISON: None.       CONTRAST:  None.       TECHNIQUE: Unenhanced CT of the head was performed using 5 mm images. Brain and   bone windows were generated.  CT dose reduction was achieved through use of a    standardized protocol tailored for this examination and automatic exposure   control for dose modulation.         FINDINGS:   There is no extra-axial fluid collection hemorrhage or shift. There is some mild   atrophy and white matter disease. 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                   Vital Signs-Reviewed the patient's vital signs.   Patient Vitals for the past 12 hrs:   Temp Resp BP SpO2   05/23/16 1601 - - - 97 %   05/23/16 1600 - - 112/56 -   05/23/16 1552 - - 117/55 -   05/23/16 1530 - - 135/66 96 %   05/23/16 1500 - -  126/63 96 %   05/23/16 1447 - - 130/71 -   05/23/16 1255 98.5 ??F (36.9 ??C) 18 123/68 100 %       Medications Given in the ED:  Medications   metoclopramide HCl (REGLAN) injection 10 mg (10 mg IntraVENous Given 05/23/16 1608)   diphenhydrAMINE (BENADRYL) injection 12.5 mg (12.5 mg IntraVENous Given 05/23/16 1608)   dexamethasone (DECADRON) 4 mg/mL injection 10 mg (10 mg IntraVENous Given 05/23/16 1608)         Diagnosis   Clinical Impression:   1. Nonintractable headache, unspecified chronicity pattern, unspecified headache type         Plan:  1:   Follow-up Information     Follow up With Details Comments [place], MD In 1 week  [address]  [place] [address]  [phone]      Darlys Gales, MD  As needed [address]  [address]  [place] [address]  [phone]      [facility] EMERGENCY DEPT  If symptoms worsen [name]  [phone]        2: Return to ED if Worse    Disposition Note:  DISCHARGE NOTE  5:18 PM  The patient has been re-evaluated and is ready for discharge. Reviewed available results with patient. Counseled pt on diagnosis and care plan. Pt has expressed understanding, and all questions have been answered. Pt agrees with plan and agrees to F/U as recommended, or return to the ED if their sxs worsen. Discharge instructions have been provided and explained  to the pt, along with reasons to return to the ED.  Written by Reynold Bowen, ED Scribe, as dictated by Alan Ripper, DO.  _______________________________   Attestations:   This note is prepared by [employer], acting as Scribe for [employer], DO.    The scribe's documentation has been prepared under my direction and personally reviewed by me in its entirety. I confirm that the note above accurately reflects all work, treatment, procedures, and medical decision making performed by me.  Alan Ripper, DO  _______________________________

## 2016-05-24 ENCOUNTER — Encounter: Primary: Family Medicine

## 2016-05-25 ENCOUNTER — Encounter: Admit: 2016-05-25 | Discharge: 2016-05-25 | Payer: MEDICARE | Primary: Family Medicine

## 2016-05-28 ENCOUNTER — Ambulatory Visit: Admit: 2016-05-28 | Discharge: 2016-05-28 | Payer: MEDICARE | Attending: Neurology | Primary: Family Medicine

## 2016-05-28 DIAGNOSIS — D332 Benign neoplasm of brain, unspecified: Secondary | ICD-10-CM

## 2016-05-28 MED ORDER — LIDOCAINE 5 % TOPICAL CREAM
5 % | Freq: Two times a day (BID) | CUTANEOUS | 1 refills | Status: DC | PRN
Start: 2016-05-28 — End: 2016-07-18

## 2016-05-28 MED ORDER — KETOROLAC TROMETHAMINE 10 MG TAB
10 mg | ORAL_TABLET | Freq: Four times a day (QID) | ORAL | 1 refills | Status: DC | PRN
Start: 2016-05-28 — End: 2016-07-17

## 2016-05-28 NOTE — Progress Notes (Signed)
Neurology Progress Note    NAME:  Kimberly [name]   DOB:   1924/07/14   MRN:   [id number]     Date/Time:  06/25/2016  Subjective:      Kimberly [name] is a 82 y.o. female here today for follow up for headaches and leg pain.  Patient was accompanied by her niece, [name] , to the visit.  Says she has been having a lot of severe headaches, so much so that patient had to go the ER.   According to patient, headache is throbbing in nature with occasional sharp pain, associated with blurry vision, nausea but no black out spell  She noted that she has been also having severe pain right leg, pain is achy and sharp at times, activity makes it worse.  Because of increased headache and pain , patient was worked in to the office today.  Review of Systems - General ROS: positive for  - fatigue and sleep disturbance  Psychological ROS: positive for - anxiety and sleep disturbances  Ophthalmic ROS: positive for - blurry vision, decreased vision, photophobia and uses glasses  ENT ROS: positive for - headaches, tinnitus and visual changes  Allergy and Immunology ROS: negative  Hematological and Lymphatic ROS: negative  Endocrine ROS: negative  Respiratory ROS: no cough, shortness of breath, or wheezing  Cardiovascular ROS: no chest pain or dyspnea on exertion  Gastrointestinal ROS: no abdominal pain, change in bowel habits, or black or bloody stools  Genito-Urinary ROS: no dysuria, trouble voiding, or hematuria  Musculoskeletal ROS: positive for - gait disturbance, joint pain, joint stiffness, joint swelling, muscle pain and muscular weakness  Neurological ROS: positive for - dizziness, gait disturbance, headaches, impaired coordination/balance, numbness/tingling, visual changes and weakness  Dermatological ROS: negative    Medications reviewed:  Current Outpatient Prescriptions   Medication Sig Dispense Refill   ??? ketorolac (TORADOL) 10 mg tablet Take 1 Tab by mouth every six (6) hours  as needed for Pain. Not to excceed 3 tabs in 24hrs  Indications: headache 30 Tab 1   ??? lidocaine 5 % topical cream Apply 45 g to affected area two (2) times daily as needed for Itching. 3 Tube 1   ??? cholecalciferol (VITAMIN D3) 1,000 unit cap Take  by mouth daily.     ??? acetaminophen (TYLENOL) 325 mg tablet Take 1 Tab by mouth three (3) times daily. 180 Tab 3   ??? cilostazol (PLETAL) 100 mg tablet TAKE 1 TABLET BY MOUTH TWICE DAILY ON EMPTY STOMACH 180 Tab 0   ??? gabapentin (NEURONTIN) 300 mg capsule TAKE 1 CAPSULE BY MOUTH THREE TIMES DAILY 270 Cap 1   ??? Walker misc 1 Units by Does Not Apply route daily. 1 Each 0   ??? isosorbide dinitrate (ISORDIL) 10 mg tablet TAKE 1 TABLET BY MOUTH TWICE DAILY 180 Tab 1   ??? oxybutynin (DITROPAN) 5 mg tablet TAKE 1 TABLET BY MOUTH TWICE DAILY 180 Tab 3   ??? simvastatin (ZOCOR) 5 mg tablet Take  by mouth nightly.     ??? polyethylene glycol (MIRALAX) 17 gram/dose powder TAKE 1 CAPFUL PO ONCE A DAY  11   ??? verapamil ER (CALAN-SR) 180 mg CR tablet Take 1 Tab by mouth nightly. 90 Tab 1   ??? alendronate (FOSAMAX) 70 mg tablet TAKE 1 TABLET BY MOUTH EVERY 7 DAYS 12 Tab 6   ??? aspirin 81 mg chewable tablet Take 81 mg by mouth daily.     ??? coenzyme q10-vitamin e (  COQ10 SG 100) 100-100 mg-unit cap Take  by mouth.     ??? ezetimibe (ZETIA) 10 mg tablet Take 1 Tab by mouth daily. 90 Tab 1   ??? omeprazole (PRILOSEC) 40 mg capsule Take 1 Cap by mouth daily. 30 Cap 0   ??? ascorbic acid (VITAMIN C) 1,000 mg tablet Take  by mouth.     ??? VITAMIN E MIXED (NATURAL VITAMIN E PO) Take 400 Units by mouth. 3 x week      ??? vitamin A 8,000 unit capsule Take 8,000 Units by mouth daily.     ??? gabapentin (NEURONTIN) 300 mg capsule TAKE 1 CAPSULE BY MOUTH THREE TIMES DAILY 90 Cap 0   ??? hydrocortisone acetate 1 % topical cream Apply  to affected area two (2) times a day. 30 g 0   ??? traMADol (ULTRAM) 50 mg tablet Take 1 Tab by mouth every six (6) hours as needed for Pain. Max Daily Amount: 200 mg. 30 Tab 1    ??? mupirocin (BACTROBAN) 2 % ointment APP EXT AA TID  0   ??? OTHER Lidocaine 3% ointment  Indications: APPLY TO AFFECTED PART TID 100 g 3   ??? nystatin-triamcinolone (MYCOLOG) 100,000-0.1 unit/gram-% ointment APP AA BID PRN  0        Objective:   Vitals:  Vitals:    05/28/16 1442   BP: 118/79   Pulse: 81   Resp: 18   Temp: 97.9 ??F (36.6 ??C)   TempSrc: Temporal   SpO2: 96%   Weight: 130 lb 3.2 oz (59.1 kg)   Height: 5\' 2"  (1.575 m)   PainSc:   4   PainLoc: Leg               Lab Data Reviewed:  Lab Results   Component Value Date/Time    WBC 4.4 05/23/2016 03:54 PM    HCT 34.9 05/23/2016 03:54 PM    HGB 11.1 05/23/2016 03:54 PM    PLATELET 210 05/23/2016 03:54 PM       Lab Results   Component Value Date/Time    Sodium 135 05/23/2016 03:54 PM    Potassium 3.9 05/23/2016 03:54 PM    Chloride 104 05/23/2016 03:54 PM    CO2 26 05/23/2016 03:54 PM    Glucose 62 05/23/2016 03:54 PM    BUN 13 05/23/2016 03:54 PM    Creatinine 0.73 05/23/2016 03:54 PM    Calcium 9.2 05/23/2016 03:54 PM       No components found for: TROPQUANT    No results found for: ANA      No results found for: HBA1C, HGBE8, HBA1CPOC, HBA1CEXT     Lab Results   Component Value Date/Time    Vitamin B12 1014 03/08/2015 09:03 AM       No results found for: ANA, Phillip Heal, XBANA    Lab Results   Component Value Date/Time    Cholesterol, total 176 08/10/2015 01:02 PM    HDL Cholesterol 74 08/10/2015 01:02 PM    LDL, calculated 90 08/10/2015 01:02 PM    VLDL, calculated 12 08/10/2015 01:02 PM    Triglyceride 60 08/10/2015 01:02 PM         CT Results (recent):    Results from [facility] encounter on 05/23/16   CT HEAD WO CONT   Narrative EXAM:  CT HEAD WO CONT    INDICATION:   headache    COMPARISON: None.    CONTRAST:  None.    TECHNIQUE: Unenhanced CT  of the head was performed using 5 mm images. Brain and  bone windows were generated.  CT dose reduction was achieved through use of a   standardized protocol tailored for this examination and automatic exposure  control for dose modulation.      FINDINGS:  There is no extra-axial fluid collection hemorrhage or shift. There is some mild  atrophy and white matter disease. 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         Impression impression: No acute findings. Incidental pituitary macroadenoma.        MRI Results (recent):    Results from [facility] encounter on 06/05/16   MRI BRAIN W WO CONT   Narrative INDICATION:  ADENOMA, HEADACCE     EXAMINATION:  MRI BRAIN, PITUITARY, W/WO CONTRAST    COMPARISON:  CT May 23, 2016    TECHNIQUE:  MR imaging of the brain was performed with particular attention to  the pituitary gland including sagittal T1, axial T1, T2, FLAIR, DWI/ADC; Pre and  post contrast dynamic imaging was performed using 7 mL of gadolinium with  particular attention to the sella turcica.    FINDINGS:      Ventricles:  Prominence of the ventricles, sulci and cisterns related to  underlying atrophy.    Intracranial Hemorrhage:  None.    Brain Parenchyma/Brainstem:  Mild chronic white matter disease in the  supratentorial brain. No acute infarction.  Basal Cisterns:  Normal.   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 This displaces normal pituitary tissue and infundibulum toward the  left. This abuts and slightly deforms the optic chiasm again just right of  midline. Mass extends toward the right cavernous sinus, though without definite  invasion  Flow Voids:  Normal.  Post Contrast:  No abnormal parenchymal or meningeal enhancement.  Additional Comments:  N/A.         Impression IMPRESSION:    1. 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  2. Mild chronic white matter disease with no acute abnormality           IR Results (recent):  No results found for this or any previous visit.    VAS/US Results (recent):    Results from Hospital Encounter encounter on 04/24/16   DUPLEX LOWER EXT VENOUS RIGHT   Narrative HISTORY: Right leg edema and pain.     PROCEDURE: Multiple real time duplex and color Doppler interrogation of the  right lower extremity deep venous system were performed.    FINDINGS: The veins are compressible and demonstrate normal phasicity and  augmentation. No filling defects are identified to suggest deep venous  thrombosis. The veins of the groin and thigh and knee and calf were evaluated.    However, there is noted to be a right suprapatellar joint effusion with debris  measuring 6.1 x 1.6 cm.         Impression IMPRESSION:    No evidence of deep venous thrombosis.    Left suprapatellar joint effusion with debris.          PHYSICAL EXAM:  General:    Alert, cooperative, no distress, appears stated age.     Head:   Normocephalic, without obvious abnormality, atraumatic.  Eyes:   Conjunctivae/corneas clear.  PERRLA  Nose:  Nares normal. No  drainage or sinus tenderness.  Throat:    Lips, mucosa, and tongue normal.  No Thrush  Neck:  Supple, symmetrical,  no adenopathy, thyroid: non tender    no carotid bruit and no JVD.Paraspinal tenderness  Back:    Symmetric,  No CVA tenderness.  Lungs:   Clear to auscultation bilaterally.  No Wheezing or Rhonchi. No rales.  Chest wall:  No tenderness or deformity. No Accessory muscle use.  Heart:   Regular rate and rhythm,  no murmur, rub or gallop.  Abdomen:   Soft, non-tender. Not distended.  Bowel sounds normal. No masses  Extremities: Extremities normal, atraumatic, No cyanosis.  No edema. No clubbing  Skin:     Texture, turgor normal. No rashes or lesions.  Not Jaundiced  Lymph nodes: Cervical, supraclavicular normal.  Psych:  Good insight.  Not depressed.  Not anxious or agitated.      NEUROLOGICAL EXAM:   Appearance:  The patient is well developed, well nourished, provides a coherent history and is in no acute distress.   Mental Status: Oriented to time, place and person. Mood and affect appropriate.   Cranial Nerves:   Intact visual fields. Fundi are benign. PERLA, EOM's full, no nystagmus, no ptosis. Facial sensation is normal. Corneal reflexes are intact. Facial movement is symmetric. Hearing is normal bilaterally. Palate is midline with normal sternocleidomastoid and trapezius muscles are normal. Tongue is midline.   Motor:  5/5 strength in upper and 4+/5 lower proximal and distal muscles. Normal bulk and tone. No fasciculations.   Reflexes:   Deep tendon reflexes 2+/4 and symmetrical.   Sensory:   Decreased sensation  to touch, pinprick and vibration.   Gait:  Abnormal gait.Ambulates with cane   Tremor:   No tremor noted.   Cerebellar:  No cerebellar signs present.   Neurovascular:  Normal heart sounds and regular rhythm, peripheral pulses intact, and no carotid bruits.       Assesment  1. Cerebral benign neoplasm (HCC)    - MRI BRAIN W WO CONT    2. Episodic paroxysmal hemicrania, not intractable    - MRI BRAIN W WO CONT    3. Paresthesia  Continue management      4. Gait disorder  Physical therapy    5. Neuritis  Continue management    6. Pituitary adenoma ([place])  - MRI BRAIN W WO CONT;     7. Right leg pain  Continue management      ___________________________________________________  PLAN:Medication reviewed with patient and family      ICD-10-CM ICD-9-CM    1. Cerebral benign neoplasm (HCC) D33.2 225.0 MRI BRAIN W WO CONT   2. Episodic paroxysmal hemicrania, not intractable G44.039 339.03 MRI BRAIN W WO CONT   3. Paresthesia R20.2 782.0 MRI BRAIN W WO CONT   4. Gait disorder R26.9 781.2 MRI BRAIN W WO CONT   5. Neuritis M79.2 729.2    6. Pituitary adenoma ([place]) D35.2 227.3 MRI BRAIN W WO CONT   7. Right leg pain M79.604 729.5 MRI BRAIN W WO CONT     Follow-up Disposition:   Return in about 4 weeks (around 06/25/2016).           ___________________________________________________    Total time spent with patient:  15   25   35    __ minutes    Care Plan discussed with:    Patient   Family    Care Manager   Consultant/Specialist :    ___________________________________________________  Attending Physician: Nelwyn Salisbury, MD

## 2016-05-28 NOTE — Telephone Encounter (Signed)
Call, rx needs to be verified.

## 2016-05-28 NOTE — Progress Notes (Signed)
Chief Complaint   Patient presents with   ??? Migraine   ??? Other     Pituitary Tumor     1. Have you been to the ER, urgent care clinic since your last visit?  Hospitalized since your last visit? [facility], and [facility] 05/23/16    2. Have you seen or consulted any other health care providers outside of the [facility] since your last visit?  Include any pap smears or colon screening. [facility] 05/23/16      Patient stated she has been having headaches intermittently since 05/23/16

## 2016-05-29 ENCOUNTER — Encounter: Admit: 2016-05-29 | Discharge: 2016-05-29 | Payer: MEDICARE | Primary: Family Medicine

## 2016-05-29 NOTE — Telephone Encounter (Signed)
Lidocaine 5% changed to ointment due to the cream is for the rectum.

## 2016-05-30 ENCOUNTER — Encounter: Attending: Internal Medicine | Primary: Family Medicine

## 2016-05-30 ENCOUNTER — Ambulatory Visit: Admit: 2016-05-30 | Discharge: 2016-05-30 | Payer: MEDICARE | Attending: Internal Medicine | Primary: Family Medicine

## 2016-05-30 NOTE — Progress Notes (Unsigned)
Chief Complaint   Patient presents with   ??? Hospital Follow Up     awoke with severe headache was [facility] then was sent over to ED.     1. Have you been to the ER, urgent care clinic since your last visit?  Hospitalized since your last visit?No    2. Have you seen or consulted any other health care providers outside of the [facility] since your last visit?  Include any pap smears or colon screening. No

## 2016-05-30 NOTE — Progress Notes (Incomplete)
Subjective:      Kimberly [name] is a 82 y.o. female who presents today for   Chief Complaint   Patient presents with   ??? Hospital Follow Up     awoke with severe headache [facility] was [facility].     Patient in today to f/u on headache. Patient was taken to [facility]. Sent to ER same day. Head CT done which was negative    Saw DR. Aralu with neurology a few days later. MRI has been ordered for Tuesday. Dr. Verdia Kuba prescribed toradol. Patient has not taken it yet.    Hypertension- bp well controlled    Patient Active Problem List    Diagnosis Date Noted   ??? 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 Memory loss 11/21/2014   ??? Acute bacterial conjunctivitis of right eye 11/21/2014   ??? SOB (shortness of breath) on exertion 06/21/2014   ??? Chest pain with normal angiography--~ 15 yrs ago [facility] 06/21/2014   ??? Hypertension, essential, benign    ??? High cholesterol      Current Outpatient Prescriptions   Medication Sig Dispense Refill   ??? ketorolac (TORADOL) 10 mg tablet Take 1 Tab by mouth every six (6) hours as needed for Pain. Not to excceed 3 tabs in 24hrs  Indications: headache 30 Tab 1   ??? lidocaine 5 % topical cream Apply 45 g to affected area two (2) times daily as needed for Itching. 3 Tube 1   ??? cholecalciferol (VITAMIN D3) 1,000 unit cap Take  by mouth daily.     ??? hydrocortisone acetate 1 % topical cream Apply  to affected area two (2) times a day. 30 g 0   ??? acetaminophen (TYLENOL) 325 mg tablet Take 1 Tab by mouth three (3) times daily. 180 Tab 3   ??? traMADol (ULTRAM) 50 mg tablet Take 1 Tab by mouth every six (6) hours as needed for Pain. Max Daily Amount: 200 mg. 30 Tab 1   ??? cilostazol (PLETAL) 100 mg tablet TAKE 1 TABLET BY MOUTH TWICE DAILY ON EMPTY STOMACH 180 Tab 0   ??? mupirocin (BACTROBAN) 2 % ointment APP EXT AA TID  0   ??? gabapentin (NEURONTIN) 300 mg capsule TAKE 1 CAPSULE BY MOUTH THREE TIMES DAILY 270 Cap 1   ??? Walker misc 1 Units by Does Not Apply route daily. 1 Each 0    ??? isosorbide dinitrate (ISORDIL) 10 mg tablet TAKE 1 TABLET BY MOUTH TWICE DAILY 180 Tab 1   ??? oxybutynin (DITROPAN) 5 mg tablet TAKE 1 TABLET BY MOUTH TWICE DAILY 180 Tab 3   ??? simvastatin (ZOCOR) 5 mg tablet Take  by mouth nightly.     ??? polyethylene glycol (MIRALAX) 17 gram/dose powder TAKE 1 CAPFUL PO ONCE A DAY  11   ??? nystatin-triamcinolone (MYCOLOG) 100,000-0.1 unit/gram-% ointment APP AA BID PRN  0   ??? verapamil ER (CALAN-SR) 180 mg CR tablet Take 1 Tab by mouth nightly. 90 Tab 1   ??? alendronate (FOSAMAX) 70 mg tablet TAKE 1 TABLET BY MOUTH EVERY 7 DAYS 12 Tab 6   ??? aspirin 81 mg chewable tablet Take 81 mg by mouth daily.     ??? coenzyme q10-vitamin e (COQ10 SG 100) 100-100 mg-unit cap Take  by mouth.     ??? ezetimibe (ZETIA) 10 mg tablet Take 1 Tab by mouth daily. 90 Tab 1   ??? omeprazole (PRILOSEC) 40 mg capsule Take 1 Cap  by mouth daily. 30 Cap 0   ??? ascorbic acid (VITAMIN C) 1,000 mg tablet Take  by mouth.     ??? VITAMIN E MIXED (NATURAL VITAMIN E PO) Take 400 Units by mouth. 3 x week      ??? vitamin A 8,000 unit capsule Take 8,000 Units by mouth daily.     ??? OTHER Lidocaine 3% ointment  Indications: APPLY TO AFFECTED PART TID 100 g 3     Allergies   Allergen Reactions   ??? Pcn [Penicillins] Unknown (comments)   ??? Sulfa (Sulfonamide Antibiotics) Unknown (comments)     Past Medical History:   Diagnosis Date   ??? Anginal pain ([place])    ??? Arthritis    ??? Constipation    ??? Hearing loss    ??? Hearing loss    ??? High cholesterol    ??? Hypertension    ??? Incontinence    ??? Joint pain    ??? Memory disorder    ??? Muscle pain    ??? Ringing in the ears    ??? Snoring    ??? Visual disturbance      Past Surgical History:   Procedure Laterality Date   ??? HX HYSTERECTOMY     ??? HX ORTHOPAEDIC      epidural pain mtg     Family History   Problem Relation Age of Onset   ??? Breast Cancer Mother 74   ??? Breast Cancer Sister 61   ??? Breast Cancer Daughter 65   ??? Dementia Brother    ??? Dementia Sister      Social History   Substance Use Topics    ??? Smoking status: Never Smoker   ??? Smokeless tobacco: Never Used   ??? Alcohol use No        Review of Systems    {ROS - Complete:30496}     Objective:     Visit Vitals   ??? BP 128/72   ??? Pulse (!) 101   ??? Temp 97.9 ??F (36.6 ??C) (Oral)   ??? Resp 18   ??? Ht 5\' 2"  (1.575 m)   ??? Wt 132 lb (59.9 kg)   ??? BMI 24.14 kg/m2     General:  Alert, cooperative, no distress, appears stated age.   Head:  Normocephalic, without obvious abnormality, atraumatic.   Eyes:  Conjunctivae/corneas clear. PERRL, EOMs intact. Fundi benign.   Ears:  Normal TMs and external ear canals both ears.   Nose: Nares normal. Septum midline. Mucosa normal. No drainage or sinus tenderness.   Throat: Lips, mucosa, and tongue normal. Teeth and gums normal.   Neck: Supple, symmetrical, trachea midline, no adenopathy, thyroid: no enlargement/tenderness/nodules, no carotid bruit and no JVD.   Back:   Symmetric, no curvature. ROM normal. No CVA tenderness.   Lungs:   Clear to auscultation bilaterally.   Chest wall:  No tenderness or deformity.   Heart:  Regular rate and rhythm, S1, S2 normal, no murmur, click, rub or gallop.   Abdomen:   Soft, non-tender. Bowel sounds normal. No masses,  No organomegaly.   Extremities: Extremities normal, atraumatic, no cyanosis or edema.   Pulses: 2+ and symmetric all extremities.   Skin: Skin color, texture, turgor normal. No rashes or lesions.   Lymph nodes: Cervical, supraclavicular, and axillary nodes normal.   Neurologic: CNII-XII intact. Normal strength, sensation and reflexes throughout.       Assessment/Plan:   {ASSESSMENT/PLAN:19072}    Follow-up Disposition: Not on File  Advised her to call back or return to office if symptoms worsen/change/persist.  Discussed expected course/resolution/complications of diagnosis in detail with patient.    Medication risks/benefits/costs/interactions/alternatives discussed with patient.  She was given an after visit summary which includes diagnoses, current medications, & vitals.   She expressed understanding with the diagnosis and plan.

## 2016-05-31 ENCOUNTER — Encounter: Admit: 2016-05-31 | Discharge: 2016-05-31 | Payer: MEDICARE | Primary: Family Medicine

## 2016-06-05 ENCOUNTER — Inpatient Hospital Stay: Admit: 2016-06-05 | Payer: MEDICARE | Attending: Neurology | Primary: Family Medicine

## 2016-06-05 DIAGNOSIS — D352 Benign neoplasm of pituitary gland: Secondary | ICD-10-CM

## 2016-06-05 MED ORDER — SODIUM CHLORIDE 0.9 % IV
Freq: Once | INTRAVENOUS | Status: AC
Start: 2016-06-05 — End: 2016-06-05
  Administered 2016-06-05: 20:00:00 via INTRAVENOUS

## 2016-06-05 MED ORDER — GADOBUTROL 10 MMOL/10 ML (1 MMOL/ML) IV
10 mmol/ mL (1 mmol/mL) | Freq: Once | INTRAVENOUS | Status: AC
Start: 2016-06-05 — End: 2016-06-05
  Administered 2016-06-05: 21:00:00 via INTRAVENOUS

## 2016-06-05 MED FILL — GADAVIST 10 MMOL/10 ML (1 MMOL/ML) INTRAVENOUS SOLUTION: 10 mmol/ mL (1 mmol/mL) | INTRAVENOUS | Qty: 10

## 2016-06-05 MED FILL — SODIUM CHLORIDE 0.9 % IV: INTRAVENOUS | Qty: 50

## 2016-06-16 MED ORDER — GABAPENTIN 300 MG CAP
300 mg | ORAL_CAPSULE | ORAL | 0 refills | Status: DC
Start: 2016-06-16 — End: 2016-07-17

## 2016-06-26 ENCOUNTER — Encounter: Attending: Neurology | Primary: Family Medicine

## 2016-06-26 NOTE — Telephone Encounter (Signed)
Patient would like to know if it's okay to travel.

## 2016-07-05 ENCOUNTER — Encounter: Attending: Cardiovascular Disease | Primary: Family Medicine

## 2016-07-07 MED ORDER — CILOSTAZOL 100 MG TAB
100 mg | ORAL_TABLET | ORAL | 0 refills | Status: DC
Start: 2016-07-07 — End: 2016-10-04

## 2016-07-10 ENCOUNTER — Encounter: Attending: Cardiovascular Disease | Primary: Family Medicine

## 2016-07-10 NOTE — Telephone Encounter (Signed)
Message left for patient

## 2016-07-17 ENCOUNTER — Ambulatory Visit: Admit: 2016-07-17 | Discharge: 2016-07-17 | Payer: MEDICARE | Attending: Neurology | Primary: Family Medicine

## 2016-07-17 DIAGNOSIS — R52 Pain, unspecified: Secondary | ICD-10-CM

## 2016-07-17 MED ORDER — NORTRIPTYLINE 10 MG CAP
10 mg | ORAL_CAPSULE | Freq: Every evening | ORAL | 2 refills | Status: DC
Start: 2016-07-17 — End: 2016-07-17

## 2016-07-17 MED ORDER — KETOROLAC TROMETHAMINE 10 MG TAB
10 mg | ORAL_TABLET | Freq: Four times a day (QID) | ORAL | 1 refills | Status: DC | PRN
Start: 2016-07-17 — End: 2016-12-21

## 2016-07-17 NOTE — Progress Notes (Signed)
Neurology Progress Note    NAME:  Kimberly [name]   DOB:   11/17/1923   MRN:   [id number]     Date/Time:  07/17/2016  Subjective:      Kimberly [name] is a 82 y.o. female here today for follow up visit for headache , pain and test result  Patient was accompanied by her niece, Ms Remo Lipps.  Says she has headache daily, mostly on waking up in morning,towards the evening , she may have the severe headache but this is not daily.Headache is associated with blurry vision, , no double vision, she experiences occasional dizziness.She has not blacked out since last visit.  She noted that she still has right leg pain, pain is sharp in nature, aggravated by activity like walking.   MRI Brain was reviewed with patient which shows large Pituitary adenoma, extending to the Cavernous sinus.  After a long discussion with patient and her niece, patient is being referred to the Neurosurgeon--Dr Maryagnes Amos for envaluation and possible resection in view of the fact that patient entertains daily headache with visual problem.  Review of Systems - General ROS: positive for  - fatigue and sleep disturbance  Psychological ROS: positive for - anxiety and sleep disturbances  Ophthalmic ROS: positive for - blurry vision, decreased vision and uses glasses  ENT ROS: positive for - headaches, hearing change, tinnitus, vertigo and visual changes  Allergy and Immunology ROS: negative  Hematological and Lymphatic ROS: negative  Endocrine ROS: negative  Respiratory ROS: no cough, shortness of breath, or wheezing  Cardiovascular ROS: no chest pain or dyspnea on exertion  Gastrointestinal ROS: no abdominal pain, change in bowel habits, or black or bloody stools  Genito-Urinary ROS: no dysuria, trouble voiding, or hematuria  Musculoskeletal ROS: positive for - gait disturbance, joint pain, joint stiffness, joint swelling, muscle pain and muscular weakness  Neurological ROS: positive for - dizziness, gait disturbance, headaches,  impaired coordination/balance, numbness/tingling, visual changes and weakness  Dermatological ROS: negative  Medications reviewed:  Current Outpatient Prescriptions   Medication Sig Dispense Refill   ??? ketorolac (TORADOL) 10 mg tablet Take 1 Tab by mouth every six (6) hours as needed for Pain. Not to excceed 3 tabs in 24hrs  Indications: headache 30 Tab 1   ??? nortriptyline (PAMELOR) 10 mg capsule Take 1 Cap by mouth nightly. 30 Cap 2   ??? cilostazol (PLETAL) 100 mg tablet TAKE 1 TABLET BY MOUTH TWICE DAILY ON EMPTY STOMACH 180 Tab 0   ??? cholecalciferol (VITAMIN D3) 1,000 unit cap Take  by mouth daily.     ??? acetaminophen (TYLENOL) 325 mg tablet Take 1 Tab by mouth three (3) times daily. 180 Tab 3   ??? gabapentin (NEURONTIN) 300 mg capsule TAKE 1 CAPSULE BY MOUTH THREE TIMES DAILY 270 Cap 1   ??? Walker misc 1 Units by Does Not Apply route daily. 1 Each 0   ??? isosorbide dinitrate (ISORDIL) 10 mg tablet TAKE 1 TABLET BY MOUTH TWICE DAILY 180 Tab 1   ??? oxybutynin (DITROPAN) 5 mg tablet TAKE 1 TABLET BY MOUTH TWICE DAILY 180 Tab 3   ??? simvastatin (ZOCOR) 5 mg tablet Take  by mouth nightly.     ??? polyethylene glycol (MIRALAX) 17 gram/dose powder TAKE 1 CAPFUL PO ONCE A DAY  11   ??? verapamil ER (CALAN-SR) 180 mg CR tablet Take 1 Tab by mouth nightly. 90 Tab 1   ??? alendronate (FOSAMAX) 70 mg tablet TAKE 1 TABLET BY MOUTH EVERY  7 DAYS 12 Tab 6   ??? aspirin 81 mg chewable tablet Take 81 mg by mouth daily.     ??? coenzyme q10-vitamin e (COQ10 SG 100) 100-100 mg-unit cap Take  by mouth.     ??? ezetimibe (ZETIA) 10 mg tablet Take 1 Tab by mouth daily. 90 Tab 1   ??? omeprazole (PRILOSEC) 40 mg capsule Take 1 Cap by mouth daily. 30 Cap 0   ??? ascorbic acid (VITAMIN C) 1,000 mg tablet Take  by mouth.     ??? VITAMIN E MIXED (NATURAL VITAMIN E PO) Take 400 Units by mouth. 3 x week      ??? vitamin A 8,000 unit capsule Take 8,000 Units by mouth daily.     ??? lidocaine 5 % topical cream Apply 45 g to affected area two (2) times  daily as needed for Itching. 3 Tube 1   ??? hydrocortisone acetate 1 % topical cream Apply  to affected area two (2) times a day. 30 g 0   ??? traMADol (ULTRAM) 50 mg tablet Take 1 Tab by mouth every six (6) hours as needed for Pain. Max Daily Amount: 200 mg. 30 Tab 1   ??? mupirocin (BACTROBAN) 2 % ointment APP EXT AA TID  0   ??? OTHER Lidocaine 3% ointment  Indications: APPLY TO AFFECTED PART TID 100 g 3   ??? nystatin-triamcinolone (MYCOLOG) 100,000-0.1 unit/gram-% ointment APP AA BID PRN  0        Objective:   Vitals:  Vitals:    07/17/16 1508   BP: (!) 154/95   Pulse: 78   Resp: 16   Temp: 98.2 ??F (36.8 ??C)   TempSrc: Temporal   SpO2: 98%   Weight: 131 lb 9.6 oz (59.7 kg)   Height: 5\' 2"  (1.575 m)   PainSc:   4   PainLoc: Leg               Lab Data Reviewed:  Lab Results   Component Value Date/Time    WBC 4.4 05/23/2016 03:54 PM    HCT 34.9 05/23/2016 03:54 PM    HGB 11.1 05/23/2016 03:54 PM    PLATELET 210 05/23/2016 03:54 PM       Lab Results   Component Value Date/Time    Sodium 135 05/23/2016 03:54 PM    Potassium 3.9 05/23/2016 03:54 PM    Chloride 104 05/23/2016 03:54 PM    CO2 26 05/23/2016 03:54 PM    Glucose 62 05/23/2016 03:54 PM    BUN 13 05/23/2016 03:54 PM    Creatinine 0.73 05/23/2016 03:54 PM    Calcium 9.2 05/23/2016 03:54 PM       No components found for: TROPQUANT    No results found for: ANA      No results found for: HBA1C, HGBE8, HBA1CPOC, HBA1CEXT, HBA1CEXT     Lab Results   Component Value Date/Time    Vitamin B12 1014 03/08/2015 09:03 AM       No results found for: ANA, Phillip Heal, XBANA    Lab Results   Component Value Date/Time    Cholesterol, total 176 08/10/2015 01:02 PM    HDL Cholesterol 74 08/10/2015 01:02 PM    LDL, calculated 90 08/10/2015 01:02 PM    VLDL, calculated 12 08/10/2015 01:02 PM    Triglyceride 60 08/10/2015 01:02 PM         CT Results (recent):    Results from [facility] encounter on 05/23/16   CT HEAD  WO CONT   Narrative EXAM:  CT HEAD WO CONT     INDICATION:   headache    COMPARISON: None.    CONTRAST:  None.    TECHNIQUE: Unenhanced CT of the head was performed using 5 mm images. Brain and  bone windows were generated.  CT dose reduction was achieved through use of a  standardized protocol tailored for this examination and automatic exposure  control for dose modulation.      FINDINGS:  There is no extra-axial fluid collection hemorrhage or shift. There is some mild  atrophy and white matter disease. 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         Impression impression: No acute findings. Incidental pituitary macroadenoma.        MRI Results (recent):    Results from [facility] encounter on 06/05/16   MRI BRAIN W WO CONT   Narrative INDICATION:  ADENOMA, HEADACCE     EXAMINATION:  MRI BRAIN, PITUITARY, W/WO CONTRAST    COMPARISON:  CT May 23, 2016    TECHNIQUE:  MR imaging of the brain was performed with particular attention to  the pituitary gland including sagittal T1, axial T1, T2, FLAIR, DWI/ADC; Pre and  post contrast dynamic imaging was performed using 7 mL of gadolinium with  particular attention to the sella turcica.    FINDINGS:      Ventricles:  Prominence of the ventricles, sulci and cisterns related to  underlying atrophy.    Intracranial Hemorrhage:  None.    Brain Parenchyma/Brainstem:  Mild chronic white matter disease in the  supratentorial brain. No acute infarction.  Basal Cisterns:  Normal.   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 This displaces normal pituitary tissue and infundibulum toward the  left. This abuts and slightly deforms the optic chiasm again just right of  midline. Mass extends toward the right cavernous sinus, though without definite  invasion  Flow Voids:  Normal.  Post Contrast:  No abnormal parenchymal or meningeal enhancement.  Additional Comments:  N/A.          Impression IMPRESSION:    1. 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  2. Mild chronic white matter disease with no acute abnormality          IR Results (recent):  No results found for this or any previous visit.    VAS/US Results (recent):    Results from Hospital Encounter encounter on 04/24/16   DUPLEX LOWER EXT VENOUS RIGHT   Narrative HISTORY: Right leg edema and pain.     PROCEDURE: Multiple real time duplex and color Doppler interrogation of the  right lower extremity deep venous system were performed.    FINDINGS: The veins are compressible and demonstrate normal phasicity and  augmentation. No filling defects are identified to suggest deep venous  thrombosis. The veins of the groin and thigh and knee and calf were evaluated.    However, there is noted to be a right suprapatellar joint effusion with debris  measuring 6.1 x 1.6 cm.         Impression IMPRESSION:    No evidence of deep venous thrombosis.    Left suprapatellar joint effusion with debris.          PHYSICAL EXAM:  General:  Alert, cooperative, no distress, appears stated age.     Head:   Normocephalic, without obvious abnormality, atraumatic.  Eyes:   Conjunctivae/corneas clear.  PERRLA  Nose:  Nares normal. No drainage or sinus tenderness.  Throat:    Lips, mucosa, and tongue normal.  No Thrush  Neck:  Supple, symmetrical,  no adenopathy, thyroid: non tender    no carotid bruit and no JVD.Paraspinal tenderness  Back:    Symmetric,  No CVA tenderness.  Lungs:   Clear to auscultation bilaterally.  No Wheezing or Rhonchi. No rales.  Chest wall:  No tenderness or deformity. No Accessory muscle use.  Heart:   Regular rate and rhythm,  no murmur, rub or gallop.  Abdomen:   Soft, non-tender. Not distended.  Bowel sounds normal. No masses  Extremities: Extremities normal, atraumatic, No cyanosis.  No edema. No clubbing   Skin:     Texture, turgor normal. No rashes or lesions.  Not Jaundiced  Lymph nodes: Cervical, supraclavicular normal.  Psych:  Good insight.  Not depressed.  Not anxious or agitated.      NEUROLOGICAL EXAM:  Appearance:  The patient is well developed, well nourished, provides a coherent history and is in no acute distress.   Mental Status: Oriented to time, place and person. Mood and affect appropriate.   Cranial Nerves:   Intact visual fields. Fundi are benign. PERLA, EOM's full, no nystagmus, no ptosis. Facial sensation is normal. Corneal reflexes are intact. Facial movement is symmetric. Hearing is normal bilaterally. Palate is midline with normal sternocleidomastoid and trapezius muscles are normal. Tongue is midline.   Motor:  5/5 strength in upper and lower proximal and distal muscles. Normal bulk and tone. No fasciculations.   Reflexes:   Deep tendon reflexes 2+/4 and symmetrical.   Sensory:   Decreased sensation to touch, pinprick and vibration.   Gait:  Abnormal g.foot drop   Tremor:   Mild  tremor noted.   Cerebellar:  No cerebellar signs present.   Neurovascular:  Normal heart sounds and regular rhythm, peripheral pulses intact, and no carotid bruits.       Assesment  1. Pain  - ketorolac (TORADOL) 10 mg tablet; Take 1 Tab by mouth every six (6) hours as needed for Pain. Not to excceed 3 tabs in 24hrs  Indications: headache  Dispense: 30 Tab; Refill: 1    2. Pituitary adenoma ([place])  Refer to Nerosurgery    3. Gait disorder  Physical therapy    4. Paresthesia  Stable    ___________________________________________________  PLAN:Medication reviewed with patient  Pamelor 10 mg p.o. qhs  Refer to Dr Maryagnes Amos      ICD-10-CM ICD-9-CM    1. Pain R52 780.96 ketorolac (TORADOL) 10 mg tablet   2. Pituitary adenoma ([place]) D35.2 227.3 REFERRAL TO NEUROSURGERY   3. Gait disorder R26.9 781.2    4. Paresthesia R20.2 782.0    5. 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 227.3  REFERRAL TO NEUROSURGERY   6. Chronic daily headache R51 784.0    7. Right leg pain M79.604 729.5      Follow-up Disposition:  Return in about 6 weeks (around 08/28/2016).           ___________________________________________________    Total time spent with patient:  15   25   35    __ minutes    Care Plan discussed with:    Patient   Family    Care Manager   Consultant/Specialist :  ___________________________________________________    Attending Physician: Nelwyn Salisbury, MD

## 2016-07-18 ENCOUNTER — Ambulatory Visit
Admit: 2016-07-18 | Discharge: 2016-07-18 | Payer: MEDICARE | Attending: Cardiovascular Disease | Primary: Family Medicine

## 2016-07-18 DIAGNOSIS — R55 Syncope and collapse: Secondary | ICD-10-CM

## 2016-07-18 MED ORDER — NORTRIPTYLINE 10 MG CAP
10 mg | ORAL_CAPSULE | ORAL | 2 refills | Status: DC
Start: 2016-07-18 — End: 2016-12-11

## 2016-07-18 NOTE — Progress Notes (Signed)
[facility]   [address], [place]                                          NEW PATIENT HPI/FOLLOW-UP      NAME:  Kimberly [name]   DOB:   25-May-1924   MRN:   [id number]   PCP:  Darlys Gales, MD    ??????    Subjective:     The patient is a 82 y.o. year old female  who returns for a routine follow-up after long hiatus. Has had 2 syncopal episodes over last 1-2 yrs without warning. Occurred suddenly without warning. Dropped to floor and minutes to recover. On one occasion was in presence of Nurse who stated that " I had a seizure". Recently found to have small pituitary microadenoma via CT and MRI head 9/17. Obtained for severe headaches. Denies change in exercise tolerance, chest pain, edema, medication intolerance, palpitations, shortness of breath, PND/orthopnea wheezing, sputum, dizziness or light headedness. Doing satisfactorily.      Review of Systems  General: Pt denies excessive weight gain or loss. Pt is able to conduct ADL's.  Respiratory: Denies shortness of breath, DOE, wheezing or stridor.  Cardiovascular: Denies precordial pain, palpitations, edema or PND;+syncope  Gastrointestinal: Denies poor appetite, indigestion, abdominal pain or blood in stool  Peripheral vascular: Denies claudication, leg cramps  Neuropsychiatric: Denies paresthesias,tingling,numbness,anxiety,depression,fatigue  Musculoskeletal: Denies pain,tenderness, soreness,swelling      Past Medical History:   Diagnosis Date   ??? Anginal pain ([place])    ??? Arthritis    ??? Constipation    ??? Hearing loss    ??? Hearing loss    ??? High cholesterol    ??? Hypertension    ??? Incontinence    ??? Joint pain    ??? Memory disorder    ??? Muscle pain    ??? Ringing in the ears    ??? Snoring    ??? Visual disturbance      Patient Active Problem List    Diagnosis Date Noted   ??? 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 Memory loss 11/21/2014   ??? Acute bacterial conjunctivitis of right eye 11/21/2014    ??? SOB (shortness of breath) on exertion 06/21/2014   ??? Chest pain with normal angiography--~ 15 yrs ago MCV 06/21/2014   ??? Hypertension, essential, benign    ??? High cholesterol       Past Surgical History:   Procedure Laterality Date   ??? HX HYSTERECTOMY     ??? HX ORTHOPAEDIC      epidural pain mtg     Allergies   Allergen Reactions   ??? Pcn [Penicillins] Unknown (comments)   ??? Sulfa (Sulfonamide Antibiotics) Unknown (comments)      Family History   Problem Relation Age of Onset   ??? Breast Cancer Mother 8   ??? Breast Cancer Sister 26   ??? Breast Cancer Daughter 53   ??? Dementia Brother    ??? Dementia Sister       Social History     Social History   ??? Marital status: WIDOWED     Spouse name: N/A   ??? Number of children: N/A   ??? Years of education: N/A     Occupational History   ??? Not on file.     Social History Main Topics   ???  Smoking status: Never Smoker   ??? Smokeless tobacco: Never Used   ??? Alcohol use No   ??? Drug use: No   ??? Sexual activity: No     Other Topics Concern   ??? Not on file     Social History Narrative      Current Outpatient Prescriptions   Medication Sig   ??? cilostazol (PLETAL) 100 mg tablet TAKE 1 TABLET BY MOUTH TWICE DAILY ON EMPTY STOMACH   ??? cholecalciferol (VITAMIN D3) 1,000 unit cap Take  by mouth daily.   ??? acetaminophen (TYLENOL) 325 mg tablet Take 1 Tab by mouth three (3) times daily.   ??? gabapentin (NEURONTIN) 300 mg capsule TAKE 1 CAPSULE BY MOUTH THREE TIMES DAILY   ??? Walker misc 1 Units by Does Not Apply route daily.   ??? isosorbide dinitrate (ISORDIL) 10 mg tablet TAKE 1 TABLET BY MOUTH TWICE DAILY   ??? oxybutynin (DITROPAN) 5 mg tablet TAKE 1 TABLET BY MOUTH TWICE DAILY   ??? simvastatin (ZOCOR) 5 mg tablet Take  by mouth nightly.   ??? polyethylene glycol (MIRALAX) 17 gram/dose powder TAKE 1 CAPFUL PO ONCE A DAY   ??? nystatin-triamcinolone (MYCOLOG) 100,000-0.1 unit/gram-% ointment APP AA BID PRN   ??? verapamil ER (CALAN-SR) 180 mg CR tablet Take 1 Tab by mouth nightly.    ??? alendronate (FOSAMAX) 70 mg tablet TAKE 1 TABLET BY MOUTH EVERY 7 DAYS   ??? aspirin 81 mg chewable tablet Take 81 mg by mouth daily.   ??? coenzyme q10-vitamin e (COQ10 SG 100) 100-100 mg-unit cap Take  by mouth.   ??? ezetimibe (ZETIA) 10 mg tablet Take 1 Tab by mouth daily.   ??? omeprazole (PRILOSEC) 40 mg capsule Take 1 Cap by mouth daily.   ??? ascorbic acid (VITAMIN C) 1,000 mg tablet Take  by mouth daily.   ??? VITAMIN E MIXED (NATURAL VITAMIN E PO) Take 400 Units by mouth. 3 x week    ??? vitamin A 8,000 unit capsule Take 8,000 Units by mouth daily.   ??? nortriptyline (PAMELOR) 10 mg capsule TAKE 1 CAPSULE BY MOUTH EVERY NIGHT   ??? ketorolac (TORADOL) 10 mg tablet Take 1 Tab by mouth every six (6) hours as needed for Pain. Not to excceed 3 tabs in 24hrs  Indications: headache     No current facility-administered medications for this visit.         I have reviewed the nurses notes, vitals, problem list, allergy list, medical history, family medical, social history and medications.        Objective:     Physical Exam:     Vitals:    07/18/16 1041   BP: 153/76   Pulse: 93   SpO2: 98%   Weight: 130 lb (59 kg)   Height: '5\' 2"'  (1.575 m)    Body mass index is 23.78 kg/(m^2).    General: Well developed, in no acute distress.  HEENT: No carotid bruits, no JVD, trach is midline.   Heart: ??Normal S1/S2 negative S3 or S4. Regular, no murmur, gallop or rub.??  Respiratory: Clear bilaterally, no wheezing or rales  Abdomen:?? ??Soft, non-tender, bowel sounds are active.??  Extremities:  No edema, normal cap refill, no cyanosis.  Neuro: A&Ox3, speech clear, gait stable.   Skin: Skin color is normal. No rashes or lesions. No diaphoresis.  Vascular: 2+ pulses symmetric in all extremities        Data Review:       Cardiographics:  EKG: NSR,LAE,borderline low limb lead voltage, minor non-specific Twave changes    Cardiology Labs:    Results for orders placed or performed during the hospital encounter of 05/10/13   EKG, 12 LEAD, INITIAL    Result Value Ref Range    Ventricular Rate 77 BPM    Atrial Rate 77 BPM    P-R Interval 140 ms    QRS Duration 108 ms    Q-T Interval 380 ms    QTC Calculation (Bezet) 430 ms    Calculated P Axis 55 degrees    Calculated R Axis -6 degrees    Calculated T Axis 94 degrees    Diagnosis       Normal sinus rhythm  Nonspecific T wave abnormality  When compared with ECG of 31-Aug-2012 15:14,  No significant change was found  Confirmed by Valeta Harms [phone]) on 05/10/2013 8:45:10 AM       Lab Results   Component Value Date/Time    Cholesterol, total 176 08/10/2015 01:02 PM    HDL Cholesterol 74 08/10/2015 01:02 PM    LDL, calculated 90 08/10/2015 01:02 PM    Triglyceride 60 08/10/2015 01:02 PM       Lab Results   Component Value Date/Time    Sodium 135 05/23/2016 03:54 PM    Potassium 3.9 05/23/2016 03:54 PM    Chloride 104 05/23/2016 03:54 PM    CO2 26 05/23/2016 03:54 PM    Anion gap 5 05/23/2016 03:54 PM    Glucose 62 05/23/2016 03:54 PM    BUN 13 05/23/2016 03:54 PM    Creatinine 0.73 05/23/2016 03:54 PM    BUN/Creatinine ratio 18 05/23/2016 03:54 PM    GFR est AA >60 05/23/2016 03:54 PM    GFR est non-AA >60 05/23/2016 03:54 PM    Calcium 9.2 05/23/2016 03:54 PM    Bilirubin, total 0.3 12/13/2015 11:34 AM    AST (SGOT) 38 12/13/2015 11:34 AM    Alk. phosphatase 68 12/13/2015 11:34 AM    Protein, total 6.8 12/13/2015 11:34 AM    Albumin 3.9 12/13/2015 11:34 AM    Globulin 4.0 05/10/2013 07:55 AM    A-G Ratio 1.3 12/13/2015 11:34 AM    ALT (SGPT) 15 12/13/2015 11:34 AM          Assessment:       ICD-10-CM ICD-9-CM    1. Syncope and collapse R55 780.2    2. Hypertension, essential, benign I10 401.1 AMB POC EKG ROUTINE W/ 12 LEADS, INTER & REP   3. SOB (shortness of breath) on exertion R06.02 786.05    4. Chest pain with normal angiography--~ 15 yrs ago MCV R07.9 786.50    5. Primary insomnia F51.01 307.42    6. Memory loss R41.3 780.93    7. High cholesterol E78.00 272.0    8. Headache disorder R51 784.0           Discussion: Patient presents at this time stable from a cardiac perspective at present. Has had 2 syncopal episodes over last 1-2 years. CT and MRI of head has revealed benign Pituitary tumor(microadenoma) being followed closely. Etiology of syncope felt to be "seizure-like". However need to consider transient conduction and/or rhythm disturbance/abnormality. Has not had episode in 8 months or so. If recurrent, call and consider ILR. Cardiac cath and NST/echo in remote and recent past unremarkable. Presence of headaches not likely related to Isordil. Pleased with present cardiac. status.      Plan:     1.Continue same  meds. Lipid profile and labs followed by PCP.    2.Encouraged to exercise to tolerance and follow low fat, low cholesterol, low sodium predominantly Plant-based (consider Mediterranean) diet. Call with questions or concerns. Will follow up any test results by phone and/or f/u here in office if needed.Marland Kitchen      3.Follow up: 6 MONTHS    I have discussed the diagnosis with the patient and the intended plan as seen in the above orders.  The patient has received an after-visit summary and questions were answered concerning future plans.  I have discussed any concerning medication side effects and warnings with the patient as well.    Lawerance Bach, MD  07/18/2016

## 2016-07-22 MED ORDER — GABAPENTIN 300 MG CAP
300 mg | ORAL_CAPSULE | ORAL | 0 refills | Status: DC
Start: 2016-07-22 — End: 2016-11-02

## 2016-07-23 NOTE — Telephone Encounter (Signed)
Patient's records faxed to Dr. Candiss Norse at [facility], [phone] and confirmation received.

## 2016-08-22 MED ORDER — GABAPENTIN 100 MG CAP
100 mg | ORAL_CAPSULE | ORAL | 0 refills | Status: DC
Start: 2016-08-22 — End: 2016-11-02

## 2016-08-28 ENCOUNTER — Encounter: Attending: Neurology | Primary: Family Medicine

## 2016-08-29 ENCOUNTER — Ambulatory Visit: Attending: Internal Medicine | Primary: Family Medicine

## 2016-10-05 MED ORDER — CILOSTAZOL 100 MG TAB
100 mg | ORAL_TABLET | ORAL | 0 refills | Status: DC
Start: 2016-10-05 — End: 2017-01-05

## 2016-10-23 NOTE — Telephone Encounter (Signed)
Called on patient's behalf yesterday. Patient confused about the medication she received.  Says the color of the medication is different.  I told her she could check with the pharmacy first since everyone was gon for the day.

## 2016-10-23 NOTE — Telephone Encounter (Signed)
Left message for Ms. Mohammad to return call to the office.

## 2016-10-29 ENCOUNTER — Encounter

## 2016-11-01 ENCOUNTER — Encounter: Attending: Nurse Practitioner | Primary: Family Medicine

## 2016-11-01 NOTE — Telephone Encounter (Signed)
Patient has an appointment on 11/02/16.

## 2016-11-02 ENCOUNTER — Ambulatory Visit: Admit: 2016-11-02 | Discharge: 2016-11-02 | Payer: MEDICARE | Attending: Nurse Practitioner | Primary: Family Medicine

## 2016-11-02 DIAGNOSIS — R55 Syncope and collapse: Secondary | ICD-10-CM

## 2016-11-02 NOTE — Patient Instructions (Signed)
ALL TEST RESULTS WILL BE DISCUSSED AT THE NEXT FOLLOW UP MEETING.

## 2016-11-02 NOTE — Progress Notes (Signed)
Chief Complaint   Patient presents with   ??? Other     Pituitary adenoma     1. Have you been to the ER, urgent care clinic since your last visit?  Hospitalized since your last visit? No    2. Have you seen or consulted any other health care providers outside of the [place] since your last visit?  Include any pap smears or colon screening. Dr. Joesphine Bare

## 2016-11-02 NOTE — Progress Notes (Signed)
Date:  11/02/16     Name:  Kimberly [name]  DOB:  12-31-1923  MRN:  [id number]     PCP:  Darlys Gales, MD    Chief Complaint   Patient presents with   ??? Other     Pituitary adenoma       HISTORY OF PRESENT ILLNESS: Follow up visit for pituitary adenoma. She notes that she has been doing fine. Last visit Dr. Verdia Kuba referred her neurosurgery for possible surgery to remove the tumor. She recently saw Dr. Candiss Norse. They informed her she would benefit from surgery. She reports some vision changes in her right eye. The neurosurgeon notes that the tumor is growing which is causing her vision to become worse. She reports that she decided to do the surgery. Planning to follow up with the neurosurgeon soon for pre-op and to answer any final questions in regards to the surgery. Today she is accompanied by her niece as well as her nurse. Recently she had foot surgery on her right foot which she said is doing well.  Her nurse notes one syncopal event that occurred when she was getting a procedure done on her right foot. She notes that she was in severe pain, the nurse who is present with her stated that she just passed out for a brief second and woke back up, said that her chest was hurting. They call EMS,no need to transfer to the hospital. She denies the loss of bowel/ bladder. No biting of the tongue. Denies any history of seizures. Also, the last visit Dr. Verdia Kuba started her on nortriptyline for headaches. Patient notes that she did not pick up the prescription and states that her headaches are not bothersome.    Except as noted above, denies  fever, chills, cough.  No CP or SOB.  No dysuria, loss of bowel or bladder control.  No Weight loss.  Appetite good.  Sleeping well.  No sweats.  No edema.  No bruising or bleeding.  No nausea or vomit.  No diarrhea.  No frequency, urgency, No depressive sxs.  No anxiety.  Denies sore throat, nasal congestion, nasal discharge,  epistaxis, tinnitus, hearing loss, back pain, muscle pain, or joint pain.       Current Outpatient Prescriptions   Medication Sig   ??? cilostazol (PLETAL) 100 mg tablet TAKE 1 TABLET BY MOUTH TWICE DAILY ON EMPTY STOMACH   ??? nortriptyline (PAMELOR) 10 mg capsule TAKE 1 CAPSULE BY MOUTH EVERY NIGHT   ??? cholecalciferol (VITAMIN D3) 1,000 unit cap Take  by mouth daily.   ??? acetaminophen (TYLENOL) 325 mg tablet Take 1 Tab by mouth three (3) times daily.   ??? gabapentin (NEURONTIN) 300 mg capsule TAKE 1 CAPSULE BY MOUTH THREE TIMES DAILY   ??? isosorbide dinitrate (ISORDIL) 10 mg tablet TAKE 1 TABLET BY MOUTH TWICE DAILY   ??? oxybutynin (DITROPAN) 5 mg tablet TAKE 1 TABLET BY MOUTH TWICE DAILY   ??? simvastatin (ZOCOR) 5 mg tablet Take  by mouth nightly.   ??? polyethylene glycol (MIRALAX) 17 gram/dose powder TAKE 1 CAPFUL PO ONCE A DAY   ??? verapamil ER (CALAN-SR) 180 mg CR tablet Take 1 Tab by mouth nightly.   ??? alendronate (FOSAMAX) 70 mg tablet TAKE 1 TABLET BY MOUTH EVERY 7 DAYS   ??? aspirin 81 mg chewable tablet Take 81 mg by mouth daily.   ??? coenzyme q10-vitamin e (COQ10 SG 100) 100-100 mg-unit cap Take  by mouth.   ??? ezetimibe (ZETIA) 10  mg tablet Take 1 Tab by mouth daily.   ??? omeprazole (PRILOSEC) 40 mg capsule Take 1 Cap by mouth daily.   ??? ascorbic acid (VITAMIN C) 1,000 mg tablet Take  by mouth daily.   ??? VITAMIN E MIXED (NATURAL VITAMIN E PO) Take 400 Units by mouth. 3 x week    ??? vitamin A 8,000 unit capsule Take 8,000 Units by mouth daily.   ??? ketorolac (TORADOL) 10 mg tablet Take 1 Tab by mouth every six (6) hours as needed for Pain. Not to excceed 3 tabs in 24hrs  Indications: headache   ??? Walker misc 1 Units by Does Not Apply route daily.   ??? nystatin-triamcinolone (MYCOLOG) 100,000-0.1 unit/gram-% ointment APP AA BID PRN     No current facility-administered medications for this visit.      Allergies   Allergen Reactions   ??? Pcn [Penicillins] Unknown (comments)    ??? Sulfa (Sulfonamide Antibiotics) Unknown (comments)     Past Medical History:   Diagnosis Date   ??? Anginal pain ([place])    ??? Arthritis    ??? Constipation    ??? Hearing loss    ??? Hearing loss    ??? High cholesterol    ??? Hypertension    ??? Incontinence    ??? Joint pain    ??? Memory disorder    ??? Muscle pain    ??? Ringing in the ears    ??? Snoring    ??? Visual disturbance      Past Surgical History:   Procedure Laterality Date   ??? HX HYSTERECTOMY     ??? HX ORTHOPAEDIC      epidural pain mtg   ??? HX OTHER SURGICAL Right 10/15/2016    ligament correction right foot     Social History     Social History   ??? Marital status: WIDOWED     Spouse name: N/A   ??? Number of children: N/A   ??? Years of education: N/A     Occupational History   ??? Not on file.     Social History Main Topics   ??? Smoking status: Never Smoker   ??? Smokeless tobacco: Never Used   ??? Alcohol use No   ??? Drug use: No   ??? Sexual activity: No     Other Topics Concern   ??? Not on file     Social History Narrative     Family History   Problem Relation Age of Onset   ??? Breast Cancer Mother 16   ??? Breast Cancer Sister 37   ??? Breast Cancer Daughter 53   ??? Dementia Brother    ??? Dementia Sister        PHYSICAL EXAMINATION:    Visit Vitals   ??? BP 118/60 (BP 1 Location: Left arm, BP Patient Position: Sitting)   ??? Pulse 96   ??? Temp 98.5 ??F (36.9 ??C) (Oral)   ??? Resp 18   ??? Ht 5\' 2"  (1.575 m)   ??? Wt 131 lb 6.4 oz (59.6 kg)   ??? SpO2 95%   ??? BMI 24.03 kg/m2     General: Well defined, nourished, and groomed individual in no acute distress.   Neck: Supple, nontender, no bruits, no pain with resistance to active range of motion.   Heart: Regular rate and rhythm, no murmurs, rub, or gallop. Normal S1S2.  Lungs: Clear to auscultation bilaterally with equal chest expansion, no cough, no wheeze  Musculoskeletal: Extremities revealed no edema  and had full range of motion of joints.   Psych: Good mood and bright affect  ????  NEUROLOGICAL EXAMINATION:    Mental Status: Alert and oriented to person, place, and time   ????  Cranial Nerves:   II, III, IV, VI: Visual acuity grossly intact. Visual fields are normal.   Pupils are equal, round, and reactive to light and accommodation.   Extra-ocular movements are full and fluid. Fundoscopic exam was benign, no ptosis or nystagmus.   V-XII: Hearing is grossly intact. Facial features are symmetric, with normal sensation and strength. The palate rises symmetrically and the tongue protrudes midline. Sternocleidomastoids 5/5.   ????  Motor Examination: Normal tone, bulk, and strength, 5/5 muscle strength throughout.   ????  Coordination: No resting or intention tremor  ????  Gait and Station: Steady while walking with walker . Normal arm swing. No pronator drift. No muscle wasting or fasiculations noted.   ????  Reflexes: DTRs 2+ throughout.   Right leg edema 2+      ASSESSMENT AND PLAN    ICD-10-CM ICD-9-CM    1. Syncope, unspecified syncope type R55 780.2 EEG   This is a 84 year old female who presents today for pituitary adenoma eval. We discussed the information shared with her by the neurosurgeon. She is going to proceed with having the surgery for removing the tumor. We discussed what happens during the pre-op process that will take place. We discussed complication of the procedure. Informed her the neurosurgeon will go over more on her next visit.They had several questions about the benefit of the surgery which was explained to them in great detail. We discussed her syncopal event even though I do believe it was related to the patient is experiencing pain in her foot. I would like to evaluate this with an EEG to get a baseline and check for any type of possible epileptiform.We discussed that she can follow back up with [place] after her surgery.We will continue all other medications as prescribed. Follow up in six months  30 minutes of this 40 minute office visit was spent in education and  counseling regarding adenoma, plan for surgery, pre op , complications .  This note will not be viewable in [facility].  Ollen Bowl, NP

## 2016-11-08 ENCOUNTER — Inpatient Hospital Stay: Admit: 2016-11-08 | Payer: MEDICARE | Attending: Nurse Practitioner | Primary: Family Medicine

## 2016-11-08 DIAGNOSIS — R55 Syncope and collapse: Secondary | ICD-10-CM

## 2016-11-08 NOTE — Procedures (Signed)
[name]  DOB: 29-Apr-1924  Age: 83 y.o.    Ordering physician: Opal Sidles  Date of EEG: 11/08/16   EEG procedure number: [id number]  Diagnosis: headache  Interpreting physician: Baldemar Friday, MD      ELECTROENCEPHALOGRAM REPORT     PROCEDURE: EEG.     CLINICAL INDICATION: The patient is a 83 y.o. female who is being evaluated for baseline electro cerebral activities and to rule out seizure focus.    EEG CLASSIFICATION: Normal    DESCRIPTION OF THE RECORD:   The background of this recording contains a posteriorly-located occipital alpha rhythm of 8 Hz that attenuates with eye opening.     Throughout the recording, there were no clear areas of focal slowing nor spike or spike-and-wave discharges seen.     Hyperventilation was not performed. Photic stimulation produced no response in the posterior head regions.     During the recording the patient did not achieve stage II sleep    INTERPRETATION: This is a normal electroencephalogram with the patient awake, showing no clear focal abnormalities or epileptiform activity. A normal EEG doesn't not rule out seizures. Clinical correlation recommended.      Baldemar Friday, MD  Neurologist

## 2016-11-08 NOTE — Procedures (Signed)
[name]  DOB: April 23, 1924  Age: 83 y.o.    Ordering physician: Opal Sidles  Date of EEG: 11/08/16   EEG procedure number: [id number]  Diagnosis: headache  Interpreting physician: Baldemar Friday, MD      ELECTROENCEPHALOGRAM REPORT     PROCEDURE: EEG.     CLINICAL INDICATION: The patient is a 83 y.o. female who is being evaluated for baseline electro cerebral activities and to rule out seizure focus.    EEG CLASSIFICATION: Normal    DESCRIPTION OF THE RECORD:   The background of this recording contains a posteriorly-located occipital alpha rhythm of 8 Hz that attenuates with eye opening.     Throughout the recording, there were no clear areas of focal slowing nor spike or spike-and-wave discharges seen.     Hyperventilation was not performed. Photic stimulation produced no response in the posterior head regions.     During the recording the patient did not achieve stage II sleep    INTERPRETATION: This is a normal electroencephalogram with the patient awake, showing no clear focal abnormalities or epileptiform activity. A normal EEG doesn't not rule out seizures. Clinical correlation recommended.      Baldemar Friday, MD  Neurologist

## 2016-11-19 ENCOUNTER — Inpatient Hospital Stay: Admit: 2016-11-19 | Payer: MEDICARE | Attending: Internal Medicine | Primary: Family Medicine

## 2016-11-19 DIAGNOSIS — Z1231 Encounter for screening mammogram for malignant neoplasm of breast: Secondary | ICD-10-CM

## 2016-11-20 ENCOUNTER — Encounter

## 2016-11-21 ENCOUNTER — Inpatient Hospital Stay: Admit: 2016-11-21 | Payer: MEDICARE | Attending: Internal Medicine | Primary: Family Medicine

## 2016-11-21 ENCOUNTER — Encounter: Primary: Family Medicine

## 2016-11-21 DIAGNOSIS — R928 Other abnormal and inconclusive findings on diagnostic imaging of breast: Secondary | ICD-10-CM

## 2016-11-26 ENCOUNTER — Ambulatory Visit: Payer: MEDICARE | Primary: Family Medicine

## 2016-11-30 ENCOUNTER — Emergency Department: Admit: 2016-11-30 | Payer: MEDICARE | Primary: Family Medicine

## 2016-11-30 ENCOUNTER — Inpatient Hospital Stay
Admit: 2016-11-30 | Discharge: 2016-11-30 | Disposition: A | Discharge status: Home / Self Care | Payer: MEDICARE | Attending: Emergency Medicine

## 2016-11-30 DIAGNOSIS — I160 Hypertensive urgency: Secondary | ICD-10-CM

## 2016-11-30 LAB — METABOLIC PANEL, BASIC
Anion gap: 6 mmol/L (ref 5–15)
BUN/Creatinine ratio: 18 (ref 12–20)
BUN: 13 MG/DL (ref 6–20)
CO2: 30 mmol/L (ref 21–32)
Calcium: 9.3 MG/DL (ref 8.5–10.1)
Chloride: 106 mmol/L (ref 97–108)
Creatinine: 0.74 MG/DL (ref 0.55–1.02)
GFR est AA: >60 mL/min/{1.73_m2} (ref >60)
GFR est non-AA: >60 mL/min/{1.73_m2} (ref >60)
Glucose: 92 mg/dL (ref 65–100)
Potassium: 3.5 mmol/L (ref 3.5–5.1)
Sodium: 142 mmol/L (ref 136–145)

## 2016-11-30 MED ORDER — LISINOPRIL 10 MG TAB
10 mg | ORAL_TABLET | Freq: Every day | ORAL | 0 refills | Status: DC
Start: 2016-11-30 — End: 2020-03-28

## 2016-11-30 NOTE — ED Notes (Signed)
Dr Mills reviewed discharge instructions with the patient.  The patient verbalized understanding.  All questions and concerns were addressed.  The patient declined a wheelchair and is discharged ambulatory in the care of family members with instructions and prescriptions in hand.  Pt is alert and oriented x 4.  Respirations are clear and unlabored.

## 2016-11-30 NOTE — ED Provider Notes (Signed)
EMERGENCY DEPARTMENT HISTORY AND PHYSICAL EXAM      Date: 11/30/2016  [name]    I have seen and evaluated this patient in the Express Care portion of triage for a headache and blurred vision x today.  Her blood pressure was 035 systolic.  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 The patients care will begin now and orders have been placed. This patient will be seen and provided further care in the Emergency Room.    Written by Edwina Barth, a scribe for Sondra Come, PA-C.      History of Presenting Illness     Chief Complaint   Patient presents with   ??? Hypertension     she got out of the hospital at [facility] yesterday for surgery; here for bp elevation     History Provided By: Patient and family    HPI: Kimberly [name], 83 y.o. female with PMHx significant for HTN, hyperlipidemia, and arthritis, presents ambulatory to the ED with cc of sudden onset of headache, visual disturbance, and elevated BP. Per pt, she awoke this morning with the sudden onset of a headache which she rates as a moderate-high intensity alongside an associated dull, throbbing, aching sensation to the posterior head. Alongside her headache, pt noted a visual disturbance with intermittently blurred vision which has since resolved. Pt informs her niece took her BP as the pt has had HTN induced headaches in the past and noted her SBP to be 210. Of note, family notes that the pt recently had a tumor resection to the pituitary gland earlier this week. Family expresses concern for evaluation given the pt's recent surgery. Pt reports no OTC medications or other notable modifying factors. Pt states she is not on any BP medications and has never been albeit chart review informs she was on HCTZ and Isordil in 2015. Pt specifically denies any fevers, chills, nausea, vomiting, abdominal pain, dizziness, chest pain, or SOB.     PSHx: hysterectomy    Social Hx: - EtOH; -Smoker;  - Illicit Drugs    PCP: Kimberly Gales, MD    There are no other complaints, changes, or physical findings at this time.    Current Outpatient Prescriptions   Medication Sig Dispense Refill   ??? lisinopril (PRINIVIL) 10 mg tablet Take 1 Tab by mouth daily. 30 Tab 0   ??? cilostazol (PLETAL) 100 mg tablet TAKE 1 TABLET BY MOUTH TWICE DAILY ON EMPTY STOMACH 180 Tab 0   ??? nortriptyline (PAMELOR) 10 mg capsule TAKE 1 CAPSULE BY MOUTH EVERY NIGHT 90 Cap 2   ??? ketorolac (TORADOL) 10 mg tablet Take 1 Tab by mouth every six (6) hours as needed for Pain. Not to excceed 3 tabs in 24hrs  Indications: headache 30 Tab 1   ??? cholecalciferol (VITAMIN D3) 1,000 unit cap Take  by mouth daily.     ??? acetaminophen (TYLENOL) 325 mg tablet Take 1 Tab by mouth three (3) times daily. 180 Tab 3   ??? gabapentin (NEURONTIN) 300 mg capsule TAKE 1 CAPSULE BY MOUTH THREE TIMES DAILY 270 Cap 1   ??? Walker misc 1 Units by Does Not Apply route daily. 1 Each 0   ??? isosorbide dinitrate (ISORDIL) 10 mg tablet TAKE 1 TABLET BY MOUTH TWICE DAILY 180 Tab 1   ??? oxybutynin (DITROPAN) 5 mg tablet TAKE 1 TABLET BY MOUTH TWICE DAILY 180 Tab 3   ??? simvastatin (  ZOCOR) 5 mg tablet Take  by mouth nightly.     ??? polyethylene glycol (MIRALAX) 17 gram/dose powder TAKE 1 CAPFUL PO ONCE A DAY  11   ??? nystatin-triamcinolone (MYCOLOG) 100,000-0.1 unit/gram-% ointment APP AA BID PRN  0   ??? verapamil ER (CALAN-SR) 180 mg CR tablet Take 1 Tab by mouth nightly. 90 Tab 1   ??? alendronate (FOSAMAX) 70 mg tablet TAKE 1 TABLET BY MOUTH EVERY 7 DAYS 12 Tab 6   ??? aspirin 81 mg chewable tablet Take 81 mg by mouth daily.     ??? coenzyme q10-vitamin e (COQ10 SG 100) 100-100 mg-unit cap Take  by mouth.     ??? ezetimibe (ZETIA) 10 mg tablet Take 1 Tab by mouth daily. 90 Tab 1   ??? omeprazole (PRILOSEC) 40 mg capsule Take 1 Cap by mouth daily. 30 Cap 0   ??? ascorbic acid (VITAMIN C) 1,000 mg tablet Take  by mouth daily.      ??? VITAMIN E MIXED (NATURAL VITAMIN E PO) Take 400 Units by mouth. 3 x week      ??? vitamin A 8,000 unit capsule Take 8,000 Units by mouth daily.       Past History     Past Medical History:  Past Medical History:   Diagnosis Date   ??? Anginal pain ([place])    ??? Arthritis    ??? Constipation    ??? Hearing loss    ??? Hearing loss    ??? High cholesterol    ??? Hypertension    ??? Incontinence    ??? Joint pain    ??? Memory disorder    ??? Muscle pain    ??? Ringing in the ears    ??? Snoring    ??? Visual disturbance      Past Surgical History:  Past Surgical History:   Procedure Laterality Date   ??? HX HYSTERECTOMY     ??? HX ORTHOPAEDIC      epidural pain mtg   ??? HX OTHER SURGICAL Right 10/15/2016    ligament correction right foot     Family History:  Family History   Problem Relation Age of Onset   ??? Breast Cancer Mother 68   ??? Breast Cancer Sister 67   ??? Breast Cancer Daughter 25   ??? Dementia Brother    ??? Dementia Sister      Social History:  Social History   Substance Use Topics   ??? Smoking status: Never Smoker   ??? Smokeless tobacco: Never Used   ??? Alcohol use No     Allergies:  Allergies   Allergen Reactions   ??? Pcn [Penicillins] Unknown (comments)   ??? Sulfa (Sulfonamide Antibiotics) Unknown (comments)     Review of Systems   Review of Systems   Constitutional: Negative for chills and fever.   HENT: Negative for congestion, ear pain, rhinorrhea, sore throat and trouble swallowing.    Eyes: Positive for visual disturbance.   Respiratory: Negative for cough, chest tightness and shortness of breath.    Cardiovascular: Negative for chest pain and palpitations.        + hypertensive   Gastrointestinal: Negative for abdominal pain, blood in stool, constipation, diarrhea, nausea and vomiting.   Genitourinary: Negative for decreased urine volume, difficulty urinating, dysuria and frequency.   Musculoskeletal: Negative for back pain and neck pain.   Skin: Negative for color change and rash.    Neurological: Positive for headaches. Negative for dizziness, weakness and light-headedness.  Physical Exam   Physical Exam   Constitutional: She is oriented to person, place, and time. Vital signs are normal. She appears well-developed and well-nourished. She does not appear ill. No distress.   HENT:   Mouth/Throat: Oropharynx is clear and moist.   Eyes: Conjunctivae are normal.   Neck: Neck supple.   Cardiovascular: Normal rate and regular rhythm.    Pulmonary/Chest: Effort normal and breath sounds normal. No accessory muscle usage. No respiratory distress.   Abdominal: Soft. She exhibits no distension. There is no tenderness.   Lymphadenopathy:     She has no cervical adenopathy.   Neurological: She is alert and oriented to person, place, and time. She has normal strength. No cranial nerve deficit or sensory deficit.   Skin: Skin is warm and dry.   Nursing note and vitals reviewed.    Diagnostic Study Results     Labs -     Recent Results (from the past 12 hour(s))   METABOLIC PANEL, BASIC    Collection Time: 11/30/16  1:06 PM   Result Value Ref Range    Sodium 142 136 - 145 mmol/L    Potassium 3.5 3.5 - 5.1 mmol/L    Chloride 106 97 - 108 mmol/L    CO2 30 21 - 32 mmol/L    Anion gap 6 5 - 15 mmol/L    Glucose 92 65 - 100 mg/dL    BUN 13 6 - 20 MG/DL    Creatinine 0.74 0.55 - 1.02 MG/DL    BUN/Creatinine ratio 18 12 - 20      GFR est AA >60 >60 ml/min/1.57m2    GFR est non-AA >60 >60 ml/min/1.19m2    Calcium 9.3 8.5 - 10.1 MG/DL     Radiologic Studies -   CT HEAD WO CONT   Final Result        CT Results  (Last 48 hours)               11/30/16 1325  CT HEAD WO CONT Final result    Impression:  IMPRESSION:    1. Pituitary mass post resection. There are no complicating features   2. Otherwise no acute intracranial abnormality               Narrative:  EXAM:  CT HEAD WO CONT       INDICATION:   Headache; headache, recent pituitary surgery       COMPARISON: June 05, 2016.       CONTRAST:  None.        TECHNIQUE: Unenhanced CT of the head was performed using 5 mm images. Brain and   bone windows were generated.  CT dose reduction was achieved through use of a   standardized protocol tailored for this examination and automatic exposure   control for dose modulation.         FINDINGS:   The ventricles and sulci are normal in size, shape and configuration and   midline. There is no significant white matter disease. There are postsurgical   changes within the sella at site of previous pituitary adenoma with adjacent   partial soft tissue and gas within the sphenoid sinus. There is residual soft   tissue in the sella and suprasellar region extending to the level of the optic   chiasm.  No acute infarct is identified. The bone windows demonstrate no   abnormalities. .               Medical  Decision Making   I am the first provider for this patient.    I reviewed the vital signs, available nursing notes, past medical history, past surgical history, family history and social history.    Vital Signs-Reviewed the patient's vital signs.  Patient Vitals for the past 12 hrs:   Temp Pulse Resp BP SpO2   11/30/16 1417 99.3 ??F (37.4 ??C) - 24 184/89 94 %   11/30/16 1130 98.6 ??F (37 ??C) 90 20 175/90 96 %     Pulse Oximetry Analysis - 96% on RA    Cardiac Monitor:   Rate: 90 bpm  Rhythm: Normal Sinus Rhythm      Records Reviewed: Nursing Notes and Old Medical Records    Provider Notes (Medical Decision Making):   Pt presents with hypertensive urgency one day s/p tumor resection to the pituitary gland. Pt informs she has never been on HTN medications however chart review informs that she has been on medications in the past including HCTZ. BP varies significantly upon visits to MDs. Pt with headache and blurry vision today; CT negative for hemorrhage. Chemistry including renal function is unremarkable. Will start pt on Lisinopril and believe that the pt should have some BP control given post-operative  status. Will advise follow up with PCP for BP monitoring and hypotension.    ED Course:   Initial assessment performed. The patients presenting problems have been discussed, and they are in agreement with the care plan formulated and outlined with them.  I have encouraged them to ask questions as they arise throughout their visit.    Disposition:  DISCHARGE NOTE:    2:40 PM  The patient is ready for discharge. The patient signs, symptoms, diagnosis, and discharge instructions have been discussed and the patient has conveyed their understanding. The patient is to follow-up as reccommended or returned to the ER should their symptoms worsen. Plan has been discussed and patient is in agreement.     PLAN:  1.   Discharge Medication List as of 11/30/2016  2:24 PM      START taking these medications    Details   lisinopril (PRINIVIL) 10 mg tablet Take 1 Tab by mouth daily., Normal, Disp-30 Tab, R-0         CONTINUE these medications which have NOT CHANGED    Details   cilostazol (PLETAL) 100 mg tablet TAKE 1 TABLET BY MOUTH TWICE DAILY ON EMPTY STOMACH, Normal, Disp-180 Tab, R-0      nortriptyline (PAMELOR) 10 mg capsule TAKE 1 CAPSULE BY MOUTH EVERY NIGHT, Normal**Patient requests 90 days supply**Disp-90 Cap, R-2      ketorolac (TORADOL) 10 mg tablet Take 1 Tab by mouth every six (6) hours as needed for Pain. Not to excceed 3 tabs in 24hrs  Indications: headache, Normal, Disp-30 Tab, R-1      cholecalciferol (VITAMIN D3) 1,000 unit cap Take  by mouth daily., Historical Med      acetaminophen (TYLENOL) 325 mg tablet Take 1 Tab by mouth three (3) times daily., Normal, Disp-180 Tab, R-3      gabapentin (NEURONTIN) 300 mg capsule TAKE 1 CAPSULE BY MOUTH THREE TIMES DAILY, Normal**Patient requests 90 days supply**Disp-270 Cap, R-1      Walker misc 1 Units by Does Not Apply route daily., Normal, Disp-1 Each, R-0      isosorbide dinitrate (ISORDIL) 10 mg tablet TAKE 1 TABLET BY MOUTH TWICE  DAILY, Normal**Patient requests 90 days supply**Disp-180 Tab, R-1      oxybutynin (DITROPAN) 5  mg tablet TAKE 1 TABLET BY MOUTH TWICE DAILY, Normal**Patient requests 90 days supply**Disp-180 Tab, R-3      simvastatin (ZOCOR) 5 mg tablet Take  by mouth nightly., Historical Med      polyethylene glycol (MIRALAX) 17 gram/dose powder TAKE 1 CAPFUL PO ONCE A DAY, Historical Med, R-11      nystatin-triamcinolone (MYCOLOG) 100,000-0.1 unit/gram-% ointment APP AA BID PRN, Historical Med, R-0      verapamil ER (CALAN-SR) 180 mg CR tablet Take 1 Tab by mouth nightly., Normal, Disp-90 Tab, R-1      alendronate (FOSAMAX) 70 mg tablet TAKE 1 TABLET BY MOUTH EVERY 7 DAYS, Normal**Patient requests 90 days supply**Disp-12 Tab, R-6      aspirin 81 mg chewable tablet Take 81 mg by mouth daily., Historical Med      coenzyme q10-vitamin e (COQ10 SG 100) 100-100 mg-unit cap Take  by mouth., Historical Med      ezetimibe (ZETIA) 10 mg tablet Take 1 Tab by mouth daily., Normal, Disp-90 Tab, R-1      omeprazole (PRILOSEC) 40 mg capsule Take 1 Cap by mouth daily., Print, Disp-30 Cap, R-0      ascorbic acid (VITAMIN C) 1,000 mg tablet Take  by mouth daily., Historical Med      VITAMIN E MIXED (NATURAL VITAMIN E PO) Take 400 Units by mouth. 3 x week , Historical Med      vitamin A 8,000 unit capsule Take 8,000 Units by mouth daily., Historical Med           2.   Follow-up Information     Follow up With Details Comments Contact Info    Kimberly Gales, MD Schedule an appointment as soon as possible for a visit in 1 week  [address]  [address]  [place] [address]  [phone]          Return to ED if worse     Diagnosis     Clinical Impression:   1. Hypertensive urgency      Attestations:  This note is prepared by Lianne Moris, acting as Scribe for Alex Gardener, MD.    Alex Gardener, MD: The scribe's documentation has been prepared under my direction and personally reviewed by me in its entirety. I confirm that  the note above accurately reflects all work, treatment, procedures, and medical decision making performed by me.      This note will not be viewable in [place].

## 2016-11-30 NOTE — ED Notes (Signed)
Patient reports she woke up this morning with a terrible headache and blurred vision. She reports she had an operation last week to remove a tumor off her pituitary gland

## 2016-12-11 ENCOUNTER — Ambulatory Visit: Admit: 2016-12-11 | Discharge: 2016-12-11 | Payer: MEDICARE | Attending: Internal Medicine | Primary: Family Medicine

## 2016-12-11 DIAGNOSIS — E7800 Pure hypercholesterolemia, unspecified: Secondary | ICD-10-CM

## 2016-12-11 MED ORDER — EZETIMIBE 10 MG TAB
10 mg | ORAL_TABLET | Freq: Every day | ORAL | 1 refills | Status: DC
Start: 2016-12-11 — End: 2017-03-12

## 2016-12-11 NOTE — Progress Notes (Signed)
Subjective:      Kimberly [name] is a 83 y.o. female who presents today for No chief complaint on file.    Underwent resection pituitary adenoma  patient has intermittent headaches after surgery  Also has bloody exudate  Draining from nose  Has noticed some "fever blisters"-----  Will RX Valtrex  Noticed some dark spots on face    Surgery was April 23    headacheFollow up with Dr. Verdia Kuba on froiday  Follow up is scheduled for May 15  Dr. Oletta Darter (neurosurgeon)    Hypertension: Check bp every other day after meal and meds  Patient is checking bp throughout the day but becomes very anxious each time.    hyperlipidemia: Needs refill of zetia    Patient Active Problem List    Diagnosis Date Noted   ??? Syncope and collapse 07/18/2016   ??? Headache disorder 07/18/2016   ??? 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 Memory loss 11/21/2014   ??? Acute bacterial conjunctivitis of right eye 11/21/2014   ??? SOB (shortness of breath) on exertion 06/21/2014   ??? Chest pain with normal angiography--~ 15 yrs ago [facility] 06/21/2014   ??? Hypertension, essential, benign    ??? High cholesterol      Current Outpatient Prescriptions   Medication Sig Dispense Refill   ??? lisinopril (PRINIVIL) 10 mg tablet Take 1 Tab by mouth daily. 30 Tab 0   ??? cilostazol (PLETAL) 100 mg tablet TAKE 1 TABLET BY MOUTH TWICE DAILY ON EMPTY STOMACH 180 Tab 0   ??? cholecalciferol (VITAMIN D3) 1,000 unit cap Take  by mouth daily.     ??? acetaminophen (TYLENOL) 325 mg tablet Take 1 Tab by mouth three (3) times daily. 180 Tab 3   ??? gabapentin (NEURONTIN) 300 mg capsule TAKE 1 CAPSULE BY MOUTH THREE TIMES DAILY 270 Cap 1   ??? Walker misc 1 Units by Does Not Apply route daily. 1 Each 0   ??? isosorbide dinitrate (ISORDIL) 10 mg tablet TAKE 1 TABLET BY MOUTH TWICE DAILY 180 Tab 1   ??? oxybutynin (DITROPAN) 5 mg tablet TAKE 1 TABLET BY MOUTH TWICE DAILY 180 Tab 3   ??? simvastatin (ZOCOR) 5 mg tablet Take  by mouth nightly.      ??? polyethylene glycol (MIRALAX) 17 gram/dose powder TAKE 1 CAPFUL PO ONCE A DAY  11   ??? verapamil ER (CALAN-SR) 180 mg CR tablet Take 1 Tab by mouth nightly. 90 Tab 1   ??? alendronate (FOSAMAX) 70 mg tablet TAKE 1 TABLET BY MOUTH EVERY 7 DAYS 12 Tab 6   ??? aspirin 81 mg chewable tablet Take 81 mg by mouth daily.     ??? coenzyme q10-vitamin e (COQ10 SG 100) 100-100 mg-unit cap Take  by mouth.     ??? ezetimibe (ZETIA) 10 mg tablet Take 1 Tab by mouth daily. 90 Tab 1   ??? omeprazole (PRILOSEC) 40 mg capsule Take 1 Cap by mouth daily. 30 Cap 0   ??? nortriptyline (PAMELOR) 10 mg capsule TAKE 1 CAPSULE BY MOUTH EVERY NIGHT 90 Cap 2   ??? ketorolac (TORADOL) 10 mg tablet Take 1 Tab by mouth every six (6) hours as needed for Pain. Not to excceed 3 tabs in 24hrs  Indications: headache 30 Tab 1   ??? nystatin-triamcinolone (MYCOLOG) 100,000-0.1 unit/gram-% ointment APP AA BID PRN  0   ??? ascorbic acid (VITAMIN C) 1,000 mg tablet Take  by mouth daily.     ??? VITAMIN E MIXED (NATURAL  VITAMIN E PO) Take 400 Units by mouth. 3 x week      ??? vitamin A 8,000 unit capsule Take 8,000 Units by mouth daily.       Allergies   Allergen Reactions   ??? Pcn [Penicillins] Unknown (comments)   ??? Sulfa (Sulfonamide Antibiotics) Unknown (comments)     Past Medical History:   Diagnosis Date   ??? Anginal pain ([place])    ??? Arthritis    ??? Constipation    ??? Hearing loss    ??? Hearing loss    ??? High cholesterol    ??? Hypertension    ??? Incontinence    ??? Joint pain    ??? Memory disorder    ??? Muscle pain    ??? Ringing in the ears    ??? Snoring    ??? Visual disturbance      Past Surgical History:   Procedure Laterality Date   ??? HX HYSTERECTOMY     ??? HX ORTHOPAEDIC      epidural pain mtg   ??? HX OTHER SURGICAL Right 10/15/2016    ligament correction right foot     Family History   Problem Relation Age of Onset   ??? Breast Cancer Mother 46   ??? Breast Cancer Sister 26   ??? Breast Cancer Daughter 70   ??? Dementia Brother    ??? Dementia Sister      Social History    Substance Use Topics   ??? Smoking status: Never Smoker   ??? Smokeless tobacco: Never Used   ??? Alcohol use No        Review of Systems    A comprehensive review of systems was negative except for that written in the HPI.     Objective:     Visit Vitals   ??? BP 140/72   ??? Pulse 82   ??? Temp 98.2 ??F (36.8 ??C) (Oral)   ??? Ht 5\' 3"  (1.6 m)   ??? Wt 132 lb (59.9 kg)   ??? BMI 23.38 kg/m2     General:  Alert, cooperative, no distress, appears stated age.   Head:  Normocephalic, without obvious abnormality, atraumatic.   Eyes:  Conjunctivae/corneas clear. PERRL, EOMs intact. Fundi benign.   Ears:  Normal TMs and external ear canals both ears.   Nose: Nares normal. Septum midline. Mucosa normal. No drainage or sinus tenderness.   Throat: Lips, mucosa, and tongue normal. Teeth and gums normal.   Neck: Supple, symmetrical, trachea midline, no adenopathy, thyroid: no enlargement/tenderness/nodules, no carotid bruit and no JVD.   Back:   Symmetric, no curvature. ROM normal. No CVA tenderness.   Lungs:   Clear to auscultation bilaterally.   Chest wall:  No tenderness or deformity.   Heart:  Regular rate and rhythm, S1, S2 normal, no murmur, click, rub or gallop.   Abdomen:   Soft, non-tender. Bowel sounds normal. No masses,  No organomegaly.   Extremities: Extremities normal, atraumatic, no cyanosis or edema.   Pulses: 2+ and symmetric all extremities.   Skin: Skin color, texture, turgor normal. No rashes or lesions.   Lymph nodes: Cervical, supraclavicular, and axillary nodes normal.   Neurologic: CNII-XII intact. Normal strength, sensation and reflexes throughout.       Assessment/Plan:       ICD-10-CM ICD-9-CM    1. High cholesterol A21.30 865.7 METABOLIC PANEL, COMPREHENSIVE      LIPID PANEL    Continue zetia     2. Hypertension, essential, benign  I10 401.1 Continue current medication regimen   3. History of pituitary adenoma Z86.39 V12.29 Follow up with neurology and neurosurgeon       Follow-up Disposition: Not on File    Advised her to call back or return to office if symptoms worsen/change/persist.  Discussed expected course/resolution/complications of diagnosis in detail with patient.    Medication risks/benefits/costs/interactions/alternatives discussed with patient.  She was given an after visit summary which includes diagnoses, current medications, & vitals.  She expressed understanding with the diagnosis and plan.

## 2016-12-11 NOTE — Progress Notes (Signed)
April 23 tumor removal     Kimberly [name]

## 2016-12-14 ENCOUNTER — Ambulatory Visit: Admit: 2016-12-14 | Discharge: 2016-12-14 | Payer: MEDICARE | Attending: Neurology | Primary: Family Medicine

## 2016-12-14 DIAGNOSIS — D329 Benign neoplasm of meninges, unspecified: Secondary | ICD-10-CM

## 2016-12-14 MED ORDER — BUTALBITAL-ACETAMINOPHEN-CAFFEINE 50 MG-325 MG-40 MG TAB
50-325-40 mg | ORAL_TABLET | Freq: Three times a day (TID) | ORAL | 1 refills | Status: DC | PRN
Start: 2016-12-14 — End: 2017-01-23

## 2016-12-14 MED ORDER — VERAPAMIL ER 180 MG TAB
180 mg | ORAL_TABLET | ORAL | 0 refills | Status: DC
Start: 2016-12-14 — End: 2017-03-11

## 2016-12-14 NOTE — Progress Notes (Signed)
Neurology Progress Note    NAME:  Kimberly [name]   DOB:   1923-09-20   MRN:   [id number]     Date/Time:  12/14/2016  Subjective:      Kimberly [name] is a 83 y.o. female here today for follow up for meningioma, headache  Patient today was accompanied by her niece Ms. Mohammed.  Patient returns today for follow-up, she recently had resection of meningioma.  According to patient, she has been doing fairly well, however, she has been having this left-sided headache, headache is a throbbing in nature and occasionally sharp pain.  She said that the frequency is variable, has not noted any aggravating factor.  She experiences blurry vision, however, patient has not had good vision with the right.  Examining the patient, there is right lower quadrant visual cut.  She however, denies loss of consciousness difficulty swallowing or difficulty breathing, but no aphasia.  Denies any fall, incontinence or dysuria.  She noted that she has been taking a lot of Tylenol for headache, I told patient to discontinue taking Tylenol, I will start patient on Fioricet q. 8 hourly as needed for headache.  Review of Systems - General ROS: positive for  - fatigue and sleep disturbance  Psychological ROS: positive for - anxiety and sleep disturbances  Ophthalmic ROS: positive for - blurry vision, decreased vision and photophobia  ENT ROS: positive for - headaches, tinnitus and visual changes  Allergy and Immunology ROS: negative  Hematological and Lymphatic ROS: negative  Endocrine ROS: negative  Respiratory ROS: no cough, shortness of breath, or wheezing  Cardiovascular ROS: no chest pain or dyspnea on exertion  Gastrointestinal ROS: no abdominal pain, change in bowel habits, or black or bloody stools  Genito-Urinary ROS: no dysuria, trouble voiding, or hematuria  Musculoskeletal ROS: Muscle weakness, joint pains  Neurological ROS: Headaches, numbness and tingling sensation,dizziness.  Dermatological ROS: negative    Medications reviewed:   Current Outpatient Prescriptions   Medication Sig Dispense Refill   ??? verapamil ER (CALAN-SR) 180 mg CR tablet TAKE 1 TABLET BY MOUTH EVERY NIGHT 90 Tab 0   ??? HYDROcodone-acetaminophen (NORCO) 5-325 mg per tablet TAKE 1/2 TO 1 TABLET BY MOUTH EVERY 4 HOURS AS NEEDED  0   ??? hydrocortisone (CORTEF) 10 mg tablet TK 2 T PO QAM AND 1 T PO ONCE AT NIGHT  0   ??? ezetimibe (ZETIA) 10 mg tablet Take 1 Tab by mouth daily. 90 Tab 1   ??? lisinopril (PRINIVIL) 10 mg tablet Take 1 Tab by mouth daily. 30 Tab 0   ??? cilostazol (PLETAL) 100 mg tablet TAKE 1 TABLET BY MOUTH TWICE DAILY ON EMPTY STOMACH 180 Tab 0   ??? cholecalciferol (VITAMIN D3) 1,000 unit cap Take  by mouth daily.     ??? acetaminophen (TYLENOL) 325 mg tablet Take 1 Tab by mouth three (3) times daily. 180 Tab 3   ??? gabapentin (NEURONTIN) 300 mg capsule TAKE 1 CAPSULE BY MOUTH THREE TIMES DAILY 270 Cap 1   ??? Walker misc 1 Units by Does Not Apply route daily. 1 Each 0   ??? isosorbide dinitrate (ISORDIL) 10 mg tablet TAKE 1 TABLET BY MOUTH TWICE DAILY 180 Tab 1   ??? oxybutynin (DITROPAN) 5 mg tablet TAKE 1 TABLET BY MOUTH TWICE DAILY 180 Tab 3   ??? simvastatin (ZOCOR) 5 mg tablet Take  by mouth nightly.     ??? polyethylene glycol (MIRALAX) 17 gram/dose powder TAKE 1 CAPFUL PO ONCE A  DAY  11   ??? alendronate (FOSAMAX) 70 mg tablet TAKE 1 TABLET BY MOUTH EVERY 7 DAYS 12 Tab 6   ??? aspirin 81 mg chewable tablet Take 81 mg by mouth daily.     ??? coenzyme q10-vitamin e (COQ10 SG 100) 100-100 mg-unit cap Take  by mouth.     ??? omeprazole (PRILOSEC) 40 mg capsule Take 1 Cap by mouth daily. 30 Cap 0   ??? ascorbic acid (VITAMIN C) 1,000 mg tablet Take  by mouth daily.     ??? VITAMIN E MIXED (NATURAL VITAMIN E PO) Take 400 Units by mouth. 3 x week      ??? vitamin A 8,000 unit capsule Take 8,000 Units by mouth daily.     ??? nortriptyline (PAMELOR) 10 mg capsule TK 1 C PO Q NIGHT  2   ??? ketorolac (TORADOL) 10 mg tablet Take 1 Tab by mouth every six (6) hours  as needed for Pain. Not to excceed 3 tabs in 24hrs  Indications: headache 30 Tab 1   ??? nystatin-triamcinolone (MYCOLOG) 100,000-0.1 unit/gram-% ointment APP AA BID PRN  0        Objective:   Vitals:  Vitals:    12/14/16 1114   BP: 132/77   Pulse: 88   Resp: 18   Temp: 98.2 ??F (36.8 ??C)   TempSrc: Temporal   SpO2: 96%   Weight: 130 lb (59 kg)   Height: 5\' 3"  (1.6 m)   PainSc:   4   PainLoc: Head               Lab Data Reviewed:  Lab Results   Component Value Date/Time    WBC 4.4 05/23/2016 03:54 PM    HCT 34.9 (L) 05/23/2016 03:54 PM    HGB 11.1 (L) 05/23/2016 03:54 PM    PLATELET 210 05/23/2016 03:54 PM       Lab Results   Component Value Date/Time    Sodium 142 11/30/2016 01:06 PM    Potassium 3.5 11/30/2016 01:06 PM    Chloride 106 11/30/2016 01:06 PM    CO2 30 11/30/2016 01:06 PM    Glucose 92 11/30/2016 01:06 PM    BUN 13 11/30/2016 01:06 PM    Creatinine 0.74 11/30/2016 01:06 PM    Calcium 9.3 11/30/2016 01:06 PM       No components found for: TROPQUANT    No results found for: ANA      No results found for: HBA1C, HGBE8, HBA1CPOC, HBA1CEXT     Lab Results   Component Value Date/Time    Vitamin B12 1014 (H) 03/08/2015 09:03 AM       No results found for: ANA, Phillip Heal, XBANA    Lab Results   Component Value Date/Time    Cholesterol, total 176 08/10/2015 01:02 PM    HDL Cholesterol 74 08/10/2015 01:02 PM    LDL, calculated 90 08/10/2015 01:02 PM    VLDL, calculated 12 08/10/2015 01:02 PM    Triglyceride 60 08/10/2015 01:02 PM         CT Results (recent):    Results from Hospital Encounter encounter on 11/30/16   CT HEAD WO CONT   Narrative EXAM:  CT HEAD WO CONT    INDICATION:   Headache; headache, recent pituitary surgery    COMPARISON: June 05, 2016.    CONTRAST:  None.    TECHNIQUE: Unenhanced CT of the head was performed using 5 mm images. Brain and  bone windows were generated.  CT dose reduction was achieved through use of a   standardized protocol tailored for this examination and automatic exposure  control for dose modulation.      FINDINGS:  The ventricles and sulci are normal in size, shape and configuration and  midline. There is no significant white matter disease. There are postsurgical  changes within the sella at site of previous pituitary adenoma with adjacent  partial soft tissue and gas within the sphenoid sinus. There is residual soft  tissue in the sella and suprasellar region extending to the level of the optic  chiasm.  No acute infarct is identified. The bone windows demonstrate no  abnormalities. .         Impression IMPRESSION:   1. Pituitary mass post resection. There are no complicating features  2. Otherwise no acute intracranial abnormality            MRI Results (recent):    Results from Hospital Encounter encounter on 06/05/16   MRI BRAIN W WO CONT   Narrative INDICATION:  ADENOMA, HEADACCE     EXAMINATION:  MRI BRAIN, PITUITARY, W/WO CONTRAST    COMPARISON:  CT May 23, 2016    TECHNIQUE:  MR imaging of the brain was performed with particular attention to  the pituitary gland including sagittal T1, axial T1, T2, FLAIR, DWI/ADC; Pre and  post contrast dynamic imaging was performed using 7 mL of gadolinium with  particular attention to the sella turcica.    FINDINGS:      Ventricles:  Prominence of the ventricles, sulci and cisterns related to  underlying atrophy.    Intracranial Hemorrhage:  None.    Brain Parenchyma/Brainstem:  Mild chronic white matter disease in the  supratentorial brain. No acute infarction.  Basal Cisterns:  Normal.   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 This displaces normal pituitary tissue and infundibulum toward the  left. This abuts and slightly deforms the optic chiasm again just right of   midline. Mass extends toward the right cavernous sinus, though without definite  invasion  Flow Voids:  Normal.  Post Contrast:  No abnormal parenchymal or meningeal enhancement.  Additional Comments:  N/A.         Impression IMPRESSION:    1. 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  2. Mild chronic white matter disease with no acute abnormality          IR Results (recent):  No results found for this or any previous visit.    VAS/US Results (recent):    Results from Hospital Encounter encounter on 04/24/16   DUPLEX LOWER EXT VENOUS RIGHT   Narrative HISTORY: Right leg edema and pain.     PROCEDURE: Multiple real time duplex and color Doppler interrogation of the  right lower extremity deep venous system were performed.    FINDINGS: The veins are compressible and demonstrate normal phasicity and  augmentation. No filling defects are identified to suggest deep venous  thrombosis. The veins of the groin and thigh and knee and calf were evaluated.    However, there is noted to be a right suprapatellar joint effusion with debris  measuring 6.1 x 1.6 cm.         Impression IMPRESSION:    No evidence of deep venous thrombosis.  Left suprapatellar joint effusion with debris.          PHYSICAL EXAM:  General:    Alert, cooperative, no distress, appears stated age.     Head:   Normocephalic, without obvious abnormality, atraumatic.  Eyes:   Conjunctivae/corneas clear.  PERRLA  Nose:  Nares normal. No drainage or sinus tenderness.  Throat:    Lips, mucosa, and tongue normal.  No Thrush  Neck:  Supple, symmetrical,  no adenopathy, thyroid: non tender    no carotid bruit and no JVD.Paraspinal tendderness  Back:    Symmetric,  No CVA tenderness.  Lungs:   Clear to auscultation bilaterally.  No Wheezing or Rhonchi. No rales.  Chest wall:  No tenderness or deformity. No Accessory muscle use.   Heart:   Regular rate and rhythm,  no murmur, rub or gallop.  Abdomen:   Soft, non-tender. Not distended.  Bowel sounds normal. No masses  Extremities: Extremities normal, atraumatic, No cyanosis.  No edema. No clubbing  Skin:     Texture, turgor normal. No rashes or lesions.  Not Jaundiced  Lymph nodes: Cervical, supraclavicular normal.  Psych:  Good insight.  Not depressed.  Not anxious or agitated.      NEUROLOGICAL EXAM:  Appearance:  The patient is well developed, well nourished, provides a coherent history and is in no acute distress.   Mental Status: Oriented to time, place and person. Mood and affect appropriate.   Cranial Nerves:   Intact visual fields. Fundi are benign. PERLA, EOM's full, no nystagmus, no ptosis. Facial sensation is normal. Corneal reflexes are intact. Facial movement is symmetric. Hearing is normal bilaterally. Palate is midline with normal sternocleidomastoid and trapezius muscles are normal. Tongue is midline.   Motor:  5/5 strength in upper and 5-/5 lower proximal and distal muscles. Normal bulk and tone. No fasciculations.   Reflexes:   Deep tendon reflexes 2+/4 and symmetrical.   Sensory:   Dysesthesia to touch, pinprick and vibration.   Gait:  Abnormal gait.Amblates with cane   Tremor:   Mild tremor noted.   Cerebellar:  No cerebellar signs present.   Neurovascular:  Normal heart sounds and regular rhythm, peripheral pulses intact, and no carotid bruits.       Assesment  1. Meningioma [facility])  Status post surgical removal    2. Headache disorder  Stable    3. Frequent falls  Physical therapy    4. Paresthesia  Stable    ___________________________________________________  PLAN:Medication reviewed with patient and niece      ICD-10-CM ICD-9-CM    1. Meningioma (HCC) D32.9 225.2    2. Headache disorder R51 784.0    3. Frequent falls R29.6 V15.88    4. Paresthesia R20.2 782.0      Follow-up Disposition:  Return in about 2 months (around 02/13/2017).            ___________________________________________________    Total time spent with patient:  [] 15   [] 25   [] 35   []  __ minutes    Care Plan discussed with:    [x] Patient   [x] Family    [] Care Manager   [] Consultant/Specialist :    ___________________________________________________    Attending Physician: Nelwyn Salisbury, MD

## 2016-12-14 NOTE — Progress Notes (Signed)
Chief Complaint   Patient presents with   ??? Other     Pituitary adenoma     1. Have you been to the ER, urgent care clinic since your last visit?  Hospitalized since your last visit?[facility] 11/[facility]/16  And [facility] 11/30/16    2. Have you seen or consulted any other health care providers outside of the [facility] since your last visit?  Include any pap smears or colon screening. JW

## 2016-12-14 NOTE — Progress Notes (Signed)
Formatting of this note is different from the original.  Chief Complaint   Patient presents with   ? Other     Pituitary adenoma     1. Have you been to the ER, urgent care clinic since your last visit?  Hospitalized since your last visit?[facility] 11/30/16  And [facility] 11/30/16    2. Have you seen or consulted any other health care providers outside of the [facility] since your last visit?  Include any pap smears or colon screening. JW    Electronically signed by Arva Chafe, LPN at 34/19/3790  2:40 PM EDT

## 2016-12-18 ENCOUNTER — Inpatient Hospital Stay
Admit: 2016-12-18 | Discharge: 2016-12-18 | Disposition: A | Discharge status: Home / Self Care | Payer: MEDICARE | Attending: Emergency Medicine

## 2016-12-18 DIAGNOSIS — R1013 Epigastric pain: Secondary | ICD-10-CM

## 2016-12-18 LAB — CBC WITH AUTOMATED DIFF
ABS. BASOPHILS: 0 10*3/uL (ref 0.0–0.1)
ABS. EOSINOPHILS: 0.1 10*3/uL (ref 0.0–0.4)
ABS. IMM. GRANS.: 0.1 10*3/uL — ABNORMAL HIGH (ref 0.00–0.04)
ABS. LYMPHOCYTES: 0.6 10*3/uL — ABNORMAL LOW (ref 0.8–3.5)
ABS. MONOCYTES: 0.5 10*3/uL (ref 0.0–1.0)
ABS. NEUTROPHILS: 5.7 10*3/uL (ref 1.8–8.0)
ABSOLUTE NRBC: 0 10*3/uL (ref 0.00–0.01)
BASOPHILS: 0 % (ref 0–1)
EOSINOPHILS: 1 % (ref 0–7)
HCT: 32.7 % — ABNORMAL LOW (ref 35.0–47.0)
HGB: 10.5 g/dL — ABNORMAL LOW (ref 11.5–16.0)
IMMATURE GRANULOCYTES: 1 % — ABNORMAL HIGH (ref 0.0–0.5)
LYMPHOCYTES: 9 % — ABNORMAL LOW (ref 12–49)
MCH: 23.6 PG — ABNORMAL LOW (ref 26.0–34.0)
MCHC: 32.1 g/dL (ref 30.0–36.5)
MCV: 73.6 FL — ABNORMAL LOW (ref 80.0–99.0)
MONOCYTES: 7 % (ref 5–13)
MPV: 10.1 FL (ref 8.9–12.9)
NEUTROPHILS: 82 % — ABNORMAL HIGH (ref 32–75)
NRBC: 0 PER 100 WBC
PLATELET: 214 10*3/uL (ref 150–400)
RBC: 4.44 M/uL (ref 3.80–5.20)
RDW: 16 % — ABNORMAL HIGH (ref 11.5–14.5)
WBC: 7 10*3/uL (ref 3.6–11.0)

## 2016-12-18 LAB — METABOLIC PANEL, COMPREHENSIVE
A-G Ratio: 0.8 — ABNORMAL LOW (ref 1.1–2.2)
A-G Ratio: 1.4 (ref 1.2–2.2)
ALT (SGPT): 24 U/L (ref 12–78)
ALT (SGPT): 15 IU/L (ref 0–32)
AST (SGOT): 18 U/L (ref 15–37)
AST (SGOT): 10 IU/L (ref 0–40)
Albumin: 2.9 g/dL — ABNORMAL LOW (ref 3.5–5.0)
Albumin: 3.6 g/dL (ref 3.2–4.6)
Alk. phosphatase: 60 U/L (ref 45–117)
Alk. phosphatase: 60 IU/L (ref 39–117)
Anion gap: 5 mmol/L (ref 5–15)
BUN/Creatinine ratio: 20 (ref 12–20)
BUN/Creatinine ratio: 19 (ref 12–28)
BUN: 15 MG/DL (ref 6–20)
BUN: 11 mg/dL (ref 10–36)
Bilirubin, total: 0.2 MG/DL (ref 0.2–1.0)
Bilirubin, total: 0.2 mg/dL (ref 0.0–1.2)
CO2: 27 mmol/L (ref 21–32)
CO2: 25 mmol/L (ref 18–29)
Calcium: 8.7 MG/DL (ref 8.5–10.1)
Calcium: 8.9 mg/dL (ref 8.7–10.3)
Chloride: 110 mmol/L — ABNORMAL HIGH (ref 97–108)
Chloride: 106 mmol/L (ref 96–106)
Creatinine: 0.76 MG/DL (ref 0.55–1.02)
Creatinine: 0.57 mg/dL (ref 0.57–1.00)
GFR est AA: >60 mL/min/{1.73_m2} (ref >60)
GFR est AA: 94 mL/min/{1.73_m2} (ref >59)
GFR est non-AA: >60 mL/min/{1.73_m2} (ref >60)
GFR est non-AA: 81 mL/min/{1.73_m2} (ref >59)
GLOBULIN, TOTAL: 2.5 g/dL (ref 1.5–4.5)
Globulin: 3.8 g/dL (ref 2.0–4.0)
Glucose: 101 mg/dL — ABNORMAL HIGH (ref 65–100)
Glucose: 113 mg/dL — ABNORMAL HIGH (ref 65–99)
Potassium: 4.4 mmol/L (ref 3.5–5.1)
Potassium: 4.3 mmol/L (ref 3.5–5.2)
Protein, total: 6.7 g/dL (ref 6.4–8.2)
Protein, total: 6.1 g/dL (ref 6.0–8.5)
Sodium: 142 mmol/L (ref 136–145)
Sodium: 143 mmol/L (ref 134–144)

## 2016-12-18 LAB — LIPID PANEL
Cholesterol, total: 133 mg/dL (ref 100–199)
HDL Cholesterol: 66 mg/dL (ref >39)
LDL, calculated: 53 mg/dL (ref 0–99)
Triglyceride: 71 mg/dL (ref 0–149)
VLDL, calculated: 14 mg/dL (ref 5–40)

## 2016-12-18 LAB — CK W/ CKMB & INDEX
CK - MB: <1 NG/ML (ref <3.6)
CK: 54 U/L (ref 26–192)

## 2016-12-18 LAB — LIPASE: Lipase: 186 U/L (ref 73–393)

## 2016-12-18 LAB — TROPONIN I: Troponin-I, Qt.: <0.04 ng/mL (ref <0.05)

## 2016-12-18 MED ORDER — ALUMINUM-MAGNESIUM HYDROXIDE 200 MG-200 MG/5 ML ORAL SUSP
200-200 mg/5 mL | ORAL | Status: AC
Start: 2016-12-18 — End: 2016-12-18
  Administered 2016-12-18: 05:00:00 via ORAL

## 2016-12-18 MED ORDER — ISOSORBIDE DINITRATE 10 MG TAB
10 mg | ORAL_TABLET | ORAL | 0 refills | Status: DC
Start: 2016-12-18 — End: 2021-05-12

## 2016-12-18 MED ORDER — LIDOCAINE 2 % MUCOSAL SOLN
2 % | Status: AC
Start: 2016-12-18 — End: 2016-12-18
  Administered 2016-12-18: 05:00:00 via OROMUCOSAL

## 2016-12-18 MED ORDER — FAMOTIDINE 20 MG TAB
20 mg | ORAL | Status: AC
Start: 2016-12-18 — End: 2016-12-18
  Administered 2016-12-18: 05:00:00 via ORAL

## 2016-12-18 MED ORDER — FAMOTIDINE 20 MG TAB
20 mg | ORAL_TABLET | Freq: Two times a day (BID) | ORAL | 0 refills | Status: DC
Start: 2016-12-18 — End: 2017-03-12

## 2016-12-18 MED FILL — FAMOTIDINE 20 MG TAB: 20 mg | ORAL | Qty: 1

## 2016-12-18 MED FILL — LIDOCAINE VISCOUS 2 % MUCOSAL SOLUTION: 2 % | Qty: 15

## 2016-12-18 MED FILL — MAG-AL 200 MG-200 MG/5 ML ORAL SUSPENSION: 200-200 mg/5 mL | ORAL | Qty: 30

## 2016-12-18 NOTE — ED Triage Notes (Signed)
Dr _____Knorr___________________ in to talk with patient and explain plan of care with  understanding and  written & verbal instructions.

## 2016-12-18 NOTE — ED Provider Notes (Signed)
EMERGENCY DEPARTMENT HISTORY AND PHYSICAL EXAM    Date: 12/18/2016  [name]    History of Presenting Illness     Chief Complaint   Patient presents with   ??? Abdominal Pain     patient reports upper abdominal pain, denies nausea or vomiting; reports "bad gastritis"       History Provided By: Patient    HPI: Kimberly [name] is a 83 y.o. female, pmhx gastritis, HTN, HCL, who presents ambulatory with Daughter to the ED c/o persistent, sharp epigastric pain with associated increased belching x yesterday evening. Pt reports a hx of gastritis and notes her current pain is similar.  She denies taking any medications for relief of symptoms or any other relieving or exacerbating factors. Pt specifically denies any fever, congestion, cough, shortness of breath, chest pain, nausea, vomiting, diarrhea, dysuria, or urinary frequency.    PCP: Darlys Gales, MD    PMHx: Significant for HTN, HCL, Arthritis, memory disorder   PSHx: Significant for hysterectomy, orthopedic, Pituitary tumor removal   Social Hx: -tobacco, -EtOH, -Illicit Drugs     There are no other complaints, changes, or physical findings at this time.     Current Outpatient Prescriptions   Medication Sig Dispense Refill   ??? famotidine (PEPCID) 20 mg tablet Take 1 Tab by mouth two (2) times a day. 10 Tab 0   ??? verapamil ER (CALAN-SR) 180 mg CR tablet TAKE 1 TABLET BY MOUTH EVERY NIGHT 90 Tab 0   ??? HYDROcodone-acetaminophen (NORCO) 5-325 mg per tablet TAKE 1/2 TO 1 TABLET BY MOUTH EVERY 4 HOURS AS NEEDED  0   ??? hydrocortisone (CORTEF) 10 mg tablet TK 2 T PO QAM AND 1 T PO ONCE AT NIGHT  0   ??? nortriptyline (PAMELOR) 10 mg capsule TK 1 C PO Q NIGHT  2   ??? butalbital-acetaminophen-caffeine (ESGIC) 50-325-40 mg per tablet Take 1 Tab by mouth every eight (8) hours as needed for Pain. 30 Tab 1   ??? ezetimibe (ZETIA) 10 mg tablet Take 1 Tab by mouth daily. 90 Tab 1   ??? lisinopril (PRINIVIL) 10 mg tablet Take 1 Tab by mouth daily. 30 Tab 0    ??? cilostazol (PLETAL) 100 mg tablet TAKE 1 TABLET BY MOUTH TWICE DAILY ON EMPTY STOMACH 180 Tab 0   ??? ketorolac (TORADOL) 10 mg tablet Take 1 Tab by mouth every six (6) hours as needed for Pain. Not to excceed 3 tabs in 24hrs  Indications: headache 30 Tab 1   ??? cholecalciferol (VITAMIN D3) 1,000 unit cap Take  by mouth daily.     ??? acetaminophen (TYLENOL) 325 mg tablet Take 1 Tab by mouth three (3) times daily. 180 Tab 3   ??? gabapentin (NEURONTIN) 300 mg capsule TAKE 1 CAPSULE BY MOUTH THREE TIMES DAILY 270 Cap 1   ??? Walker misc 1 Units by Does Not Apply route daily. 1 Each 0   ??? isosorbide dinitrate (ISORDIL) 10 mg tablet TAKE 1 TABLET BY MOUTH TWICE DAILY 180 Tab 1   ??? oxybutynin (DITROPAN) 5 mg tablet TAKE 1 TABLET BY MOUTH TWICE DAILY 180 Tab 3   ??? simvastatin (ZOCOR) 5 mg tablet Take  by mouth nightly.     ??? polyethylene glycol (MIRALAX) 17 gram/dose powder TAKE 1 CAPFUL PO ONCE A DAY  11   ??? nystatin-triamcinolone (MYCOLOG) 100,000-0.1 unit/gram-% ointment APP AA BID PRN  0   ??? alendronate (FOSAMAX) 70 mg tablet TAKE 1 TABLET  BY MOUTH EVERY 7 DAYS 12 Tab 6   ??? aspirin 81 mg chewable tablet Take 81 mg by mouth daily.     ??? coenzyme q10-vitamin e (COQ10 SG 100) 100-100 mg-unit cap Take  by mouth.     ??? omeprazole (PRILOSEC) 40 mg capsule Take 1 Cap by mouth daily. 30 Cap 0   ??? ascorbic acid (VITAMIN C) 1,000 mg tablet Take  by mouth daily.     ??? VITAMIN E MIXED (NATURAL VITAMIN E PO) Take 400 Units by mouth. 3 x week      ??? vitamin A 8,000 unit capsule Take 8,000 Units by mouth daily.         Past History     Past Medical History:  Past Medical History:   Diagnosis Date   ??? Anginal pain ([place])    ??? Arthritis    ??? Constipation    ??? Hearing loss    ??? Hearing loss    ??? High cholesterol    ??? Hypertension    ??? Incontinence    ??? Joint pain    ??? Memory disorder    ??? Muscle pain    ??? Ringing in the ears    ??? Snoring    ??? Visual disturbance        Past Surgical History:  Past Surgical History:    Procedure Laterality Date   ??? HX HYSTERECTOMY     ??? HX ORTHOPAEDIC      epidural pain mtg   ??? HX OTHER SURGICAL Right 10/15/2016    ligament correction right foot   ??? HX OTHER SURGICAL  11/26/2016    Pituitary tumor removal       Family History:  Family History   Problem Relation Age of Onset   ??? Breast Cancer Mother 21   ??? Breast Cancer Sister 8   ??? Breast Cancer Daughter 26   ??? Dementia Brother    ??? Dementia Sister        Social History:  Social History   Substance Use Topics   ??? Smoking status: Never Smoker   ??? Smokeless tobacco: Never Used   ??? Alcohol use No       Allergies:  Allergies   Allergen Reactions   ??? Pcn [Penicillins] Unknown (comments)   ??? Sulfa (Sulfonamide Antibiotics) Unknown (comments)         Review of Systems   Review of Systems   Constitutional: Negative.  Negative for fever.   Eyes: Negative.    Respiratory: Negative.  Negative for shortness of breath.    Cardiovascular: Negative for chest pain.   Gastrointestinal: Positive for abdominal pain. Negative for nausea and vomiting.        Increased belching   Endocrine: Negative.    Genitourinary: Negative.  Negative for difficulty urinating, dysuria and hematuria.   Musculoskeletal: Negative.    Skin: Negative.    Allergic/Immunologic: Negative.    Neurological: Negative.    Psychiatric/Behavioral: Negative for suicidal ideas.   All other systems reviewed and are negative.      Physical Exam   Physical Exam   Constitutional: She is oriented to person, place, and time. She appears well-developed and well-nourished. No distress.   HENT:   Head: Normocephalic and atraumatic.   Nose: Nose normal.   Eyes: Conjunctivae and EOM are normal. No scleral icterus.   Neck: Normal range of motion. No tracheal deviation present.   Cardiovascular: Normal rate, regular rhythm, normal heart sounds and intact distal  pulses.  Exam reveals no friction rub.    No murmur heard.  Pulmonary/Chest: Effort normal and breath sounds normal. No stridor. No  respiratory distress. She has no wheezes. She has no rales.   Abdominal: Soft. Bowel sounds are normal. She exhibits no distension. There is tenderness. There is no rebound.       Musculoskeletal: Normal range of motion. She exhibits no tenderness.   Neurological: She is alert and oriented to person, place, and time. No cranial nerve deficit.   Skin: Skin is warm and dry. No rash noted. She is not diaphoretic.   Psychiatric: She has a normal mood and affect. Her speech is normal and behavior is normal. Judgment and thought content normal. Cognition and memory are normal.   Nursing note and vitals reviewed.        Diagnostic Study Results     Labs -     Recent Results (from the past 12 hour(s))   METABOLIC PANEL, COMPREHENSIVE    Collection Time: 12/18/16  2:19 AM   Result Value Ref Range    Sodium 142 136 - 145 mmol/L    Potassium 4.4 3.5 - 5.1 mmol/L    Chloride 110 (H) 97 - 108 mmol/L    CO2 27 21 - 32 mmol/L    Anion gap 5 5 - 15 mmol/L    Glucose 101 (H) 65 - 100 mg/dL    BUN 15 6 - 20 MG/DL    Creatinine 0.76 0.55 - 1.02 MG/DL    BUN/Creatinine ratio 20 12 - 20      GFR est AA >60 >60 ml/min/1.76m    GFR est non-AA >60 >60 ml/min/1.767m   Calcium 8.7 8.5 - 10.1 MG/DL    Bilirubin, total 0.2 0.2 - 1.0 MG/DL    ALT (SGPT) 24 12 - 78 U/L    AST (SGOT) 18 15 - 37 U/L    Alk. phosphatase 60 45 - 117 U/L    Protein, total 6.7 6.4 - 8.2 g/dL    Albumin 2.9 (L) 3.5 - 5.0 g/dL    Globulin 3.8 2.0 - 4.0 g/dL    A-G Ratio 0.8 (L) 1.1 - 2.2     CK W/ CKMB & INDEX    Collection Time: 12/18/16  2:19 AM   Result Value Ref Range    CK 54 26 - 192 U/L    CK - MB <1.0 <3.6 NG/ML    CK-MB Index Cannot be calculated 0 - 2.5     TROPONIN I    Collection Time: 12/18/16  2:19 AM   Result Value Ref Range    Troponin-I, Qt. <0.04 <0.05 ng/mL   LIPASE    Collection Time: 12/18/16  2:19 AM   Result Value Ref Range    Lipase 186 73 - 393 U/L   CBC WITH AUTOMATED DIFF    Collection Time: 12/18/16  3:04 AM   Result Value Ref Range     WBC 7.0 3.6 - 11.0 K/uL    RBC 4.44 3.80 - 5.20 M/uL    HGB 10.5 (L) 11.5 - 16.0 g/dL    HCT 32.7 (L) 35.0 - 47.0 %    MCV 73.6 (L) 80.0 - 99.0 FL    MCH 23.6 (L) 26.0 - 34.0 PG    MCHC 32.1 30.0 - 36.5 g/dL    RDW 16.0 (H) 11.5 - 14.5 %    PLATELET 214 150 - 400 K/uL    MPV 10.1 8.9 - 12.9  FL    NRBC 0.0 0 PER 100 WBC    ABSOLUTE NRBC 0.00 0.00 - 0.01 K/uL    NEUTROPHILS 82 (H) 32 - 75 %    LYMPHOCYTES 9 (L) 12 - 49 %    MONOCYTES 7 5 - 13 %    EOSINOPHILS 1 0 - 7 %    BASOPHILS 0 0 - 1 %    IMMATURE GRANULOCYTES 1 (H) 0.0 - 0.5 %    ABS. NEUTROPHILS 5.7 1.8 - 8.0 K/UL    ABS. LYMPHOCYTES 0.6 (L) 0.8 - 3.5 K/UL    ABS. MONOCYTES 0.5 0.0 - 1.0 K/UL    ABS. EOSINOPHILS 0.1 0.0 - 0.4 K/UL    ABS. BASOPHILS 0.0 0.0 - 0.1 K/UL    ABS. IMM. GRANS. 0.1 (H) 0.00 - 0.04 K/UL    DF SMEAR SCANNED      RBC COMMENTS ANISOCYTOSIS  1+           Radiologic Studies -   No orders to display     CT Results  (Last 48 hours)    None        CXR Results  (Last 48 hours)    None            Medical Decision Making   I am the first provider for this patient.    I reviewed the vital signs, available nursing notes, past medical history, past surgical history, family history and social history.    Vital Signs-Reviewed the patient's vital signs.  Patient Vitals for the past 12 hrs:   Temp Pulse Resp BP SpO2   12/18/16 0349 - (!) 59 27 - 96 %   12/18/16 0106 - - - - 98 %   12/18/16 0105 - - - 183/87 -   12/18/16 0034 97.9 ??F (36.6 ??C) 72 16 163/68 99 %       Pulse Oximetry Analysis - 99% on RA    Cardiac Monitor:   Rate: 72 bpm  Rhythm: Normal Sinus Rhythm      Records Reviewed: Nursing Notes, Old Medical Records, Previous electrocardiograms, Previous Radiology Studies and Previous Laboratory Studies    Provider Notes (Medical Decision Making):     DDX:  Gastritis, acs, pancreatitis, uti, dehydration  Plan:  Labs, ekg, gi cocktail    Impression:  Gastritis/epigastric pain    ED Course:    Initial assessment performed. The patients presenting problems have been discussed, and they are in agreement with the care plan formulated and outlined with them.  I have encouraged them to ask questions as they arise throughout their visit.    I reviewed our electronic medical record system for any past medical records that were available that may contribute to the patients current condition, the nursing notes and and vital signs from today's visit    Nursing notes will be reviewed as they become available in realtime while the pt has been in the ED.  Glean Hess, MD    3:39 AM  Progress note:  Pt noted to have resolution go sx after GI cocktail, ready for discharge. Discussed lab findings with pt and/or family, specifically noting otherwise negative labs. Pt will follow up as instructed. All questions have been answered, pt voiced understanding and agreement with plan.     If narcotics were prescribed, pt was advised not to drive or operate heavy machinery. If abx were prescribed, pt advised that diarrhea and rash are possible side effects of the medications.  Specific return precautions provided in addition to instructions for pt to return to the ED immediately should sx worsen at any time.  Glean Hess, MD      Critical Care Time:     None    PLAN:  1.   Current Discharge Medication List      START taking these medications    Details   famotidine (PEPCID) 20 mg tablet Take 1 Tab by mouth two (2) times a day.  Qty: 10 Tab, Refills: 0           2.   Follow-up Information     Follow up With Details Comments Contact Info    Darlys Gales, MD Schedule an appointment as soon as possible for a visit in 2 days  [address]  [address]  [place] [address]  [phone]          Return to ED if worse     Disposition:    Discharge Note:  3:39 AM  The pt is ready for discharge. The pt's signs, symptoms, diagnosis, and discharge instructions have been discussed and pt has conveyed their  understanding. The pt is to follow up as recommended or return to ER should their symptoms worsen. Plan has been discussed and pt is in agreement.      Diagnosis     Clinical Impression:   1. Abdominal pain, epigastric        Attestations:    This note is prepared by Quenton Fetter, acting as Scribe for Glean Hess, MD    Glean Hess, MD : The scribe's documentation has been prepared under my direction and personally reviewed by me in its entirety. I confirm that the note above accurately reflects all work, treatment, procedures, and medical decision making performed by me.           This note will not be viewable in [place].

## 2017-01-06 MED ORDER — CILOSTAZOL 100 MG TAB
100 mg | ORAL_TABLET | ORAL | 0 refills | Status: DC
Start: 2017-01-06 — End: 2017-04-09

## 2017-01-16 ENCOUNTER — Encounter: Attending: Cardiovascular Disease | Primary: Family Medicine

## 2017-01-23 ENCOUNTER — Ambulatory Visit
Admit: 2017-01-23 | Discharge: 2017-01-23 | Payer: MEDICARE | Attending: Cardiovascular Disease | Primary: Family Medicine

## 2017-01-23 DIAGNOSIS — R0602 Shortness of breath: Secondary | ICD-10-CM

## 2017-01-23 NOTE — Progress Notes (Signed)
Chief Complaint   Patient presents with   ??? Chest Pain (Angina)     pt c/o belching.     1. Have you been to the ER, urgent care clinic since your last visit?  Hospitalized since your last visit?12/18/16 ABDOMINAL PAIN;[facility]    2. Have you seen or consulted any other health care providers outside of the [facility] since your last visit?  Include any pap smears or colon screening.MD.SAHNI SING ;  NEUROLOGIST  SURGERY F/U ALSO SEEN MD Fran Lowes 5/18

## 2017-01-23 NOTE — Patient Instructions (Addendum)
-- Please STOP the Quadrapax; this is a barbiturate medication that can be very dangerous in the elderly and can interact with the Ditropan you take for bladder symptoms    -- This medication can also be habit forming and lethal if an overdose should occur.    -- Can use Pepcid/Famotidine for gastritis symptoms, but please follow up with your primary care physician about this issue    -- Please make a 6 months' follow up visit     Heart-Healthy Diet: Care Instructions  Your Care Instructions    A heart-healthy diet has lots of vegetables, fruits, nuts, beans, and whole grains, and is low in salt. It limits foods that are high in saturated fat, such as meats, cheeses, and fried foods. It may be hard to change your diet, but even small changes can lower your risk of heart attack and heart disease.  Follow-up care is a key part of your treatment and safety. Be sure to make and go to all appointments, and call your doctor if you are having problems. It's also a good idea to know your test results and keep a list of the medicines you take.  How can you care for yourself at home?  Watch your portions  ?? Learn what a serving is. A "serving" and a "portion" are not always the same thing. Make sure that you are not eating larger portions than are recommended. For example, a serving of pasta is ?? cup. A serving size of meat is 2 to 3 ounces. A 3-ounce serving is about the size of a deck of cards. Measure serving sizes until you are good at "eyeballing" them. Keep in mind that restaurants often serve portions that are 2 or 3 times the size of one serving.  ?? To keep your energy level up and keep you from feeling hungry, eat often but in smaller portions.  ?? Eat only the number of calories you need to stay at a healthy weight. If you need to lose weight, eat fewer calories than your body burns (through exercise and other physical activity).  Eat more fruits and vegetables   ?? Eat a variety of fruit and vegetables every day. Dark green, deep orange, red, or yellow fruits and vegetables are especially good for you. Examples include spinach, carrots, peaches, and berries.  ?? Keep carrots, celery, and other veggies handy for snacks. Buy fruit that is in season and store it where you can see it so that you will be tempted to eat it.  ?? Cook dishes that have a lot of veggies in them, such as stir-fries and soups.  Limit saturated and trans fat  ?? Read food labels, and try to avoid saturated and trans fats. They increase your risk of heart disease. Trans fat is found in many processed foods such as cookies and crackers.  ?? Use olive or canola oil when you cook. Try cholesterol-lowering spreads, such as Benecol or Take Control.  ?? Bake, broil, grill, or steam foods instead of frying them.  ?? Choose lean meats instead of high-fat meats such as hot dogs and sausages. Cut off all visible fat when you prepare meat.  ?? Eat fish, skinless poultry, and meat alternatives such as soy products instead of high-fat meats. Soy products, such as tofu, may be especially good for your heart.  ?? Choose low-fat or fat-free milk and dairy products.  Eat fish  ?? Eat at least two servings of fish a week. Certain fish, such  as salmon and tuna, contain omega-3 fatty acids, which may help reduce your risk of heart attack.  Eat foods high in fiber  ?? Eat a variety of grain products every day. Include whole-grain foods that have lots of fiber and nutrients. Examples of whole-grain foods include oats, whole wheat bread, and brown rice.  ?? Buy whole-grain breads and cereals, instead of white bread or pastries.  Limit salt and sodium  ?? Limit how much salt and sodium you eat to help lower your blood pressure.  ?? Taste food before you salt it. Add only a little salt when you think you need it. With time, your taste buds will adjust to less salt.  ?? Eat fewer snack items, fast foods, and other high-salt, processed foods.  Check food labels for the amount of sodium in packaged foods.  ?? Choose low-sodium versions of canned goods (such as soups, vegetables, and beans).  Limit sugar  ?? Limit drinks and foods with added sugar. These include candy, desserts, and soda pop.  Limit alcohol  ?? Limit alcohol to no more than 2 drinks a day for men and 1 drink a day for women. Too much alcohol can cause health problems.  When should you call for help?  Watch closely for changes in your health, and be sure to contact your doctor if:  ? ?? You would like help planning heart-healthy meals.   Where can you learn more?  Go to [employer].  Enter V137 in the search box to learn more about "Heart-Healthy Diet: Care Instructions."  Current as of: April 27, 2015  Content Version: 11.4  ?? 2006-2017 [employer]. Care instructions adapted under license by [employer] (which disclaims liability or warranty for this information). If you have questions about a medical condition or this instruction, always ask your healthcare professional. [place] any warranty or liability for your use of this information.

## 2017-01-23 NOTE — Progress Notes (Signed)
[facility]   [address] [place]                                          NEW PATIENT HPI/FOLLOW-UP      NAME:  Kimberly [name]   DOB:   [birth year]   MRN:   [id number]   PCP:  Darlys Gales, MD    ??????    Subjective:     The patient is a 83 y.o. year old female with h/o SOB, HTN, high cholesterol, syncope and collapse and microadenoma of pituitary gland s/p resection who returns for a routine follow-up. Since the last visit, patient reports no new symptoms. Has improved since removal of pituitary tumor. She has been having episodes of gastritis and belching, much improved with Pepcid. Her nurse's aid has been giving her Radio broadcast assistant (generic: BellaDonna Elixir) "which seems to help her" with her gastritis sx.     Denies change in exercise tolerance, chest pain, edema, medication intolerance, palpitations, shortness of breath, PND/orthopnea, wheezing, sputum, syncope, dizziness or light headedness. Doing satisfactorily.      Review of Systems  General: Pt denies excessive weight gain or loss. Pt is able to conduct ADL's.  Respiratory: Denies shortness of breath, DOE, wheezing or stridor.  Cardiovascular: Denies precordial pain, palpitations, edema or PND  Gastrointestinal: +indigestion Denies poor appetite, abdominal pain or blood in stool  Peripheral vascular: Denies claudication, leg cramps  Neuropsychiatric: Denies paresthesias,tingling,numbness,anxiety,depression,fatigue  Musculoskeletal: Denies pain,tenderness, soreness,swelling      Past Medical History:   Diagnosis Date   ??? Anginal pain ([place])    ??? Arthritis    ??? Constipation    ??? Hearing loss    ??? Hearing loss    ??? High cholesterol    ??? Hypertension    ??? Incontinence    ??? Joint pain    ??? Memory disorder    ??? Muscle pain    ??? Ringing in the ears    ??? Snoring    ??? Visual disturbance      Patient Active Problem List    Diagnosis Date Noted   ??? Syncope and collapse 07/18/2016   ??? Headache disorder 07/18/2016    ??? 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 Memory loss 11/21/2014   ??? Acute bacterial conjunctivitis of right eye 11/21/2014   ??? SOB (shortness of breath) on exertion 06/21/2014   ??? Chest pain with normal angiography--~ 15 yrs ago [facility] 06/21/2014   ??? Hypertension, essential, benign    ??? High cholesterol       Past Surgical History:   Procedure Laterality Date   ??? HX HYSTERECTOMY     ??? HX ORTHOPAEDIC      epidural pain mtg   ??? HX OTHER SURGICAL Right 10/15/2016    ligament correction right foot   ??? HX OTHER SURGICAL  11/26/2016    Pituitary tumor removal     Allergies   Allergen Reactions   ??? Pcn [Penicillins] Unknown (comments)   ??? Sulfa (Sulfonamide Antibiotics) Unknown (comments)      Family History   Problem Relation Age of Onset   ??? Breast Cancer Mother 66   ??? Breast Cancer Sister 53   ??? Breast Cancer Daughter 48   ??? Dementia Brother    ??? Dementia Sister       Social History  Social History   ??? Marital status: WIDOWED     Spouse name: N/A   ??? Number of children: N/A   ??? Years of education: N/A     Occupational History   ??? Not on file.     Social History Main Topics   ??? Smoking status: Never Smoker   ??? Smokeless tobacco: Never Used   ??? Alcohol use No   ??? Drug use: No   ??? Sexual activity: No     Other Topics Concern   ??? Not on file     Social History Narrative      Current Outpatient Prescriptions   Medication Sig   ??? cilostazol (PLETAL) 100 mg tablet TAKE 1 TABLET BY MOUTH TWICE DAILY ON EMPTY STOMACH (Patient taking differently: TAKE 1 TABLET BY MOUTH  DAILY ON EMPTY STOMACH)   ??? isosorbide dinitrate (ISORDIL) 10 mg tablet TAKE 1 TABLET BY MOUTH TWICE DAILY   ??? cholecalciferol (VITAMIN D3) 1,000 unit cap Take  by mouth daily.   ??? acetaminophen (TYLENOL) 325 mg tablet Take 1 Tab by mouth three (3) times daily.   ??? oxybutynin (DITROPAN) 5 mg tablet TAKE 1 TABLET BY MOUTH TWICE DAILY   ??? simvastatin (ZOCOR) 5 mg tablet Take  by mouth nightly.   ??? polyethylene glycol (MIRALAX) 17 gram/dose powder TAKE 1 CAPFUL PO ONCE  A DAY   ??? alendronate (FOSAMAX) 70 mg tablet TAKE 1 TABLET BY MOUTH EVERY 7 DAYS   ??? omeprazole (PRILOSEC) 40 mg capsule Take 1 Cap by mouth daily.   ??? VITAMIN E MIXED (NATURAL VITAMIN E PO) Take 400 Units by mouth. 3 x week    ??? hydrocortisone (CORTEF) 10 mg tablet TK 1 T PO Q 12 H   ??? famotidine (PEPCID) 20 mg tablet Take 1 Tab by mouth two (2) times a day.   ??? verapamil ER (CALAN-SR) 180 mg CR tablet TAKE 1 TABLET BY MOUTH EVERY NIGHT   ??? butalbital-acetaminophen-caffeine (ESGIC) 50-325-40 mg per tablet Take 1 Tab by mouth every eight (8) hours as needed for Pain.   ??? ezetimibe (ZETIA) 10 mg tablet Take 1 Tab by mouth daily.   ??? lisinopril (PRINIVIL) 10 mg tablet Take 1 Tab by mouth daily.   ??? gabapentin (NEURONTIN) 300 mg capsule TAKE 1 CAPSULE BY MOUTH THREE TIMES DAILY   ??? Walker misc 1 Units by Does Not Apply route daily.   ??? aspirin 81 mg chewable tablet Take 81 mg by mouth daily.   ??? coenzyme q10-vitamin e (COQ10 SG 100) 100-100 mg-unit cap Take  by mouth.   ??? vitamin A 8,000 unit capsule Take 8,000 Units by mouth daily.     No current facility-administered medications for this visit.         I have reviewed the nurses notes, vitals, problem list, allergy list, medical history, family medical, social history and medications.      Objective:     Physical Exam:     Vitals:    01/23/17 1034 01/23/17 1048   BP: 145/89 142/81   Pulse: 97 92   Resp: 12    SpO2: 96%    Weight: 139 lb (63 kg)    Height: _0  (1.575 m)     Body mass index is 25.42 kg/(m^2).    General: WDWN elderly female, in no acute distress. Pleasant affect.  HEENT: No carotid bruits, no JVD, trach is midline.   Heart: ??Normal S1/S2 negative S3 or S4. Regular, no murmur,  gallop or rub.??  Respiratory: Clear bilaterally, no wheezing or rales  Abdomen:?? ??Soft, non-tender, bowel sounds are active.??  Extremities:  No edema, normal cap refill, no cyanosis.  Neuro: A&Ox3, speech clear, gait stable.    Skin: Skin color is normal. No rashes or lesions. No diaphoresis.  Vascular: 2+ pulses symmetric in all extremities      Data Review:       Cardiographics:    EKG interpretation:  Rhythm: normal sinus rhythm; and regular . Rate (approx.): 81; Axis: normal; P wave: normal; QRS interval: normal ; ST/T wave: normal. This EKG was interpreted by Lebron Conners, PA-C.      Cardiology Labs:    Results for orders placed or performed during the hospital encounter of 05/10/13   EKG, 12 LEAD, INITIAL   Result Value Ref Range    Ventricular Rate 77 BPM    Atrial Rate 77 BPM    P-R Interval 140 ms    QRS Duration 108 ms    Q-T Interval 380 ms    QTC Calculation (Bezet) 430 ms    Calculated P Axis 55 degrees    Calculated R Axis -6 degrees    Calculated T Axis 94 degrees    Diagnosis       Normal sinus rhythm  Nonspecific T wave abnormality  When compared with ECG of 31-Aug-2012 15:14,  No significant change was found  Confirmed by Valeta Harms [id number]) on 05/10/2013 8:45:10 AM       Lab Results   Component Value Date/Time    Cholesterol, total 133 12/17/2016 12:36 PM    HDL Cholesterol 66 12/17/2016 12:36 PM    LDL, calculated 53 12/17/2016 12:36 PM    Triglyceride 71 12/17/2016 12:36 PM       Lab Results   Component Value Date/Time    Sodium 142 12/18/2016 02:19 AM    Potassium 4.4 12/18/2016 02:19 AM    Chloride 110 (H) 12/18/2016 02:19 AM    CO2 27 12/18/2016 02:19 AM    Anion gap 5 12/18/2016 02:19 AM    Glucose 101 (H) 12/18/2016 02:19 AM    BUN 15 12/18/2016 02:19 AM    Creatinine 0.76 12/18/2016 02:19 AM    BUN/Creatinine ratio 20 12/18/2016 02:19 AM    GFR est AA >60 12/18/2016 02:19 AM    GFR est non-AA >60 12/18/2016 02:19 AM    Calcium 8.7 12/18/2016 02:19 AM    Bilirubin, total 0.2 12/18/2016 02:19 AM    AST (SGOT) 18 12/18/2016 02:19 AM    Alk. phosphatase 60 12/18/2016 02:19 AM    Protein, total 6.7 12/18/2016 02:19 AM    Albumin 2.9 (L) 12/18/2016 02:19 AM    Globulin 3.8 12/18/2016 02:19 AM     A-G Ratio 0.8 (L) 12/18/2016 02:19 AM    ALT (SGPT) 24 12/18/2016 02:19 AM          Assessment:       ICD-10-CM ICD-9-CM    1. SOB (shortness of breath) on exertion R06.02 786.05 AMB POC EKG ROUTINE W/ 12 LEADS, INTER & REP   2. Hypertension, essential, benign I10 401.1 AMB POC EKG ROUTINE W/ 12 LEADS, INTER & REP   3. High cholesterol E78.00 272.0 AMB POC EKG ROUTINE W/ 12 LEADS, INTER & REP   4. Syncope and collapse R55 780.2 AMB POC EKG ROUTINE W/ 12 LEADS, INTER & REP   5. Chest pain with normal angiography--~ 15 yrs ago [facility] R07.9 786.50 AMB POC EKG  ROUTINE W/ 12 LEADS, INTER & REP         Discussion: Patient presents at this time stable from a cardiac perspective. BP well controlled.   Per Drugs.com website, the [employer] is composed of the following:     Each 5 mL (one teaspoonful) for oral administration contains:  Phenobarbital, USP.......................................................16.2 mg  ?? (WARNING:?? May be habit forming)  Hyoscyamine sulfate, USP.........................................0.1037 mg  Atropine sulfate, USP.................................................0.0194 mg  Scopolamine hydrobromide, USP................................0.0065?? mg  Alcohol...............................................................................23%      Pleased with present status.    Discussed/seen with Dr. Sampson Goon     Plan:     1.STOP the Quadrapax.     2. Lipid profile and labs followed by PCP.    3.Encouraged to exercise to tolerance, and follow low fat, low cholesterol, low sodium predominantly Plant-based (consider Mediterranean) diet.    4.Follow up: 6 months   --  Call with questions or concerns.    -- Will follow up any test results by phone and/or f/u here in office if needed..        I have discussed the diagnosis with the patient and the intended plan as seen in the above orders.  The patient has received an after-visit summary  and questions were answered concerning future plans.  I have discussed any concerning medication side effects and warnings with the patient as well.    Lebron Conners, PA-C  01/23/2017     Patient seen and examined. All pertinent data reviewed. I have reviewed detailed note as outlined by Bebe Shaggy. Case discussed with Nursing/medical assistant staff and Bebe Shaggy. Patient presents cardiac stable. Advised to d/c herbal supplement as may have potentially adversarial drug/med interaction. Plans as outlined.    Palyn Scrima L. Bleu Minerd,MD,FACC,FSCAI

## 2017-01-25 ENCOUNTER — Encounter: Attending: Neurology | Primary: Family Medicine

## 2017-03-11 ENCOUNTER — Inpatient Hospital Stay
Admit: 2017-03-11 | Discharge: 2017-03-11 | Disposition: A | Discharge status: Home / Self Care | Payer: MEDICARE | Attending: Emergency Medicine

## 2017-03-11 DIAGNOSIS — R300 Dysuria: Secondary | ICD-10-CM

## 2017-03-11 LAB — METABOLIC PANEL, COMPREHENSIVE
A-G Ratio: 0.9 — ABNORMAL LOW (ref 1.1–2.2)
ALT (SGPT): 24 U/L (ref 12–78)
AST (SGOT): 15 U/L (ref 15–37)
Albumin: 3.2 g/dL — ABNORMAL LOW (ref 3.5–5.0)
Alk. phosphatase: 73 U/L (ref 45–117)
Anion gap: 7 mmol/L (ref 5–15)
BUN/Creatinine ratio: 12 (ref 12–20)
BUN: 9 MG/DL (ref 6–20)
Bilirubin, total: 0.4 MG/DL (ref 0.2–1.0)
CO2: 28 mmol/L (ref 21–32)
Calcium: 9 MG/DL (ref 8.5–10.1)
Chloride: 108 mmol/L (ref 97–108)
Creatinine: 0.78 MG/DL (ref 0.55–1.02)
GFR est AA: >60 mL/min/{1.73_m2} (ref >60)
GFR est non-AA: >60 mL/min/{1.73_m2} (ref >60)
Globulin: 3.5 g/dL (ref 2.0–4.0)
Glucose: 103 mg/dL — ABNORMAL HIGH (ref 65–100)
Potassium: 3.9 mmol/L (ref 3.5–5.1)
Protein, total: 6.7 g/dL (ref 6.4–8.2)
Sodium: 143 mmol/L (ref 136–145)

## 2017-03-11 LAB — URINALYSIS W/ REFLEX CULTURE
Bacteria: NEGATIVE /hpf
Bilirubin: NEGATIVE
Blood: NEGATIVE
Glucose: NEGATIVE mg/dL
Ketone: NEGATIVE mg/dL
Leukocyte Esterase: NEGATIVE
Nitrites: NEGATIVE
Protein: NEGATIVE mg/dL
Specific gravity: 1.01 (ref 1.003–1.030)
Urobilinogen: 0.2 EU/dL (ref 0.2–1.0)
pH (UA): 6 (ref 5.0–8.0)

## 2017-03-11 LAB — CBC WITH AUTOMATED DIFF
ABS. BASOPHILS: 0.1 10*3/uL (ref 0.0–0.1)
ABS. EOSINOPHILS: 0.1 10*3/uL (ref 0.0–0.4)
ABS. IMM. GRANS.: 0 10*3/uL (ref 0.00–0.04)
ABS. LYMPHOCYTES: 0.9 10*3/uL (ref 0.8–3.5)
ABS. MONOCYTES: 0.5 10*3/uL (ref 0.0–1.0)
ABS. NEUTROPHILS: 3 10*3/uL (ref 1.8–8.0)
ABSOLUTE NRBC: 0 10*3/uL (ref 0.00–0.01)
BASOPHILS: 1 % (ref 0–1)
EOSINOPHILS: 2 % (ref 0–7)
HCT: 36.2 % (ref 35.0–47.0)
HGB: 11.1 g/dL — ABNORMAL LOW (ref 11.5–16.0)
IMMATURE GRANULOCYTES: 0 % (ref 0.0–0.5)
LYMPHOCYTES: 20 % (ref 12–49)
MCH: 23.2 PG — ABNORMAL LOW (ref 26.0–34.0)
MCHC: 30.7 g/dL (ref 30.0–36.5)
MCV: 75.7 FL — ABNORMAL LOW (ref 80.0–99.0)
MONOCYTES: 10 % (ref 5–13)
MPV: 10.8 FL (ref 8.9–12.9)
NEUTROPHILS: 66 % (ref 32–75)
NRBC: 0 PER 100 WBC
PLATELET: 220 10*3/uL (ref 150–400)
RBC: 4.78 M/uL (ref 3.80–5.20)
RDW: 15.3 % — ABNORMAL HIGH (ref 11.5–14.5)
WBC: 4.6 10*3/uL (ref 3.6–11.0)

## 2017-03-11 MED ORDER — VERAPAMIL ER 180 MG TAB
180 mg | ORAL_TABLET | ORAL | 0 refills | Status: DC
Start: 2017-03-11 — End: 2017-06-15

## 2017-03-11 NOTE — ED Provider Notes (Signed)
EMERGENCY DEPARTMENT HISTORY AND PHYSICAL EXAM      Date: 03/11/2017  [name]    History of Presenting Illness     Chief Complaint   Patient presents with   ??? Urinary Frequency     Patient c/o urinary pain and frequency x 2 weeks, [facility] but states she is still having pain and frequency.   ??? Urinary Pain       History Provided By: Patient    HPI: Kimberly [name], 83 y.o. female with PMHx significant for HTN, HLD, presents ambulatory to the ED with cc of dysuria for ~2 weeks. Pt reports that she went to [facility] on 03/06/2017 for dysuria, where they diagnosed her with a UTI (trace E coli noted on urine culture). She was initially treated with Cefuroxime but transitioned to doxycycline after she developed burning discomfort in extremities. Patient is taking doxycycline as prescribed. Pt denies abdominal pain, N/V/D, chest pain, chills, and vaginal itching/discharge.      There are no other complaints, changes, or physical findings at this time.    PCP: Darlys Gales, MD    Current Outpatient Prescriptions   Medication Sig Dispense Refill   ??? verapamil ER (CALAN-SR) 180 mg CR tablet TAKE 1 TABLET BY MOUTH EVERY NIGHT 90 Tab 0   ??? hydrocortisone (CORTEF) 10 mg tablet TK 1 T PO Q 12 H  5   ??? cilostazol (PLETAL) 100 mg tablet TAKE 1 TABLET BY MOUTH TWICE DAILY ON EMPTY STOMACH (Patient taking differently: TAKE 1 TABLET BY MOUTH  DAILY ON EMPTY STOMACH) 180 Tab 0   ??? isosorbide dinitrate (ISORDIL) 10 mg tablet TAKE 1 TABLET BY MOUTH TWICE DAILY 180 Tab 0   ??? famotidine (PEPCID) 20 mg tablet Take 1 Tab by mouth two (2) times a day. 10 Tab 0   ??? ezetimibe (ZETIA) 10 mg tablet Take 1 Tab by mouth daily. 90 Tab 1   ??? lisinopril (PRINIVIL) 10 mg tablet Take 1 Tab by mouth daily. 30 Tab 0   ??? cholecalciferol (VITAMIN D3) 1,000 unit cap Take  by mouth daily.     ??? acetaminophen (TYLENOL) 325 mg tablet Take 1 Tab by mouth three (3) times daily. 180 Tab 3    ??? gabapentin (NEURONTIN) 300 mg capsule TAKE 1 CAPSULE BY MOUTH THREE TIMES DAILY 270 Cap 1   ??? Walker misc 1 Units by Does Not Apply route daily. 1 Each 0   ??? oxybutynin (DITROPAN) 5 mg tablet TAKE 1 TABLET BY MOUTH TWICE DAILY 180 Tab 3   ??? simvastatin (ZOCOR) 5 mg tablet Take  by mouth nightly.     ??? polyethylene glycol (MIRALAX) 17 gram/dose powder TAKE 1 CAPFUL PO ONCE A DAY  11   ??? alendronate (FOSAMAX) 70 mg tablet TAKE 1 TABLET BY MOUTH EVERY 7 DAYS 12 Tab 6   ??? aspirin 81 mg chewable tablet Take 81 mg by mouth daily.     ??? coenzyme q10-vitamin e (COQ10 SG 100) 100-100 mg-unit cap Take  by mouth.     ??? omeprazole (PRILOSEC) 40 mg capsule Take 1 Cap by mouth daily. 30 Cap 0   ??? VITAMIN E MIXED (NATURAL VITAMIN E PO) Take 400 Units by mouth. 3 x week      ??? vitamin A 8,000 unit capsule Take 8,000 Units by mouth daily.         Past History  Past Medical History:  Past Medical History:   Diagnosis Date   ??? Anginal pain ([place])    ??? Arthritis    ??? Constipation    ??? Hearing loss    ??? Hearing loss    ??? High cholesterol    ??? Hypertension    ??? Incontinence    ??? Joint pain    ??? Memory disorder    ??? Muscle pain    ??? Ringing in the ears    ??? Snoring    ??? Visual disturbance        Past Surgical History:  Past Surgical History:   Procedure Laterality Date   ??? HX HYSTERECTOMY     ??? HX ORTHOPAEDIC      epidural pain mtg   ??? HX OTHER SURGICAL Right 10/15/2016    ligament correction right foot   ??? HX OTHER SURGICAL  11/26/2016    Pituitary tumor removal       Family History:  Family History   Problem Relation Age of Onset   ??? Breast Cancer Mother 82   ??? Breast Cancer Sister 70   ??? Breast Cancer Daughter 70   ??? Dementia Brother    ??? Dementia Sister        Social History:  Social History   Substance Use Topics   ??? Smoking status: Never Smoker   ??? Smokeless tobacco: Never Used   ??? Alcohol use No       Allergies:  Allergies   Allergen Reactions   ??? Pcn [Penicillins] Unknown (comments)    ??? Sulfa (Sulfonamide Antibiotics) Unknown (comments)         Review of Systems   Review of Systems   Constitutional: Negative for chills, fatigue and fever.   HENT: Negative for congestion, rhinorrhea and sore throat.    Eyes: Negative for pain, discharge and visual disturbance.   Respiratory: Negative for cough, chest tightness, shortness of breath and wheezing.    Cardiovascular: Negative for chest pain, palpitations and leg swelling.   Gastrointestinal: Negative for abdominal pain, constipation, diarrhea, nausea and vomiting.   Genitourinary: Positive for dysuria. Negative for frequency and hematuria.   Musculoskeletal: Positive for myalgias (burning in bilateral arms and legs). Negative for arthralgias and back pain.   Skin: Negative for rash.   Neurological: Negative for dizziness, weakness, light-headedness and headaches.   Psychiatric/Behavioral: Negative.        Physical Exam   Physical Exam   Constitutional: She is oriented to person, place, and time. She appears well-developed and well-nourished. No distress.   HENT:   Head: Normocephalic and atraumatic.   Eyes: EOM are normal. Right eye exhibits no discharge. Left eye exhibits no discharge. No scleral icterus.   Neck: Normal range of motion. Neck supple. No tracheal deviation present.   Cardiovascular: Normal rate, regular rhythm, normal heart sounds and intact distal pulses.  Exam reveals no gallop and no friction rub.    No murmur heard.  Pulmonary/Chest: Effort normal and breath sounds normal. No respiratory distress. She has no wheezes. She has no rales.   Abdominal: Soft. She exhibits no distension. There is no tenderness.   Musculoskeletal: Normal range of motion. She exhibits no edema.   Lymphadenopathy:     She has no cervical adenopathy.   Neurological: She is alert and oriented to person, place, and time.   No focal neuro deficits   Skin: Skin is warm and dry. No rash noted.   Psychiatric: She has a normal  mood and affect.    Nursing note and vitals reviewed.      Diagnostic Study Results     Labs -     Recent Results (from the past 12 hour(s))   URINALYSIS W/ REFLEX CULTURE    Collection Time: 03/11/17 11:34 AM   Result Value Ref Range    Color YELLOW/STRAW      Appearance CLEAR CLEAR      Specific gravity 1.010 1.003 - 1.030      pH (UA) 6.0 5.0 - 8.0      Protein NEGATIVE  NEG mg/dL    Glucose NEGATIVE  NEG mg/dL    Ketone NEGATIVE  NEG mg/dL    Bilirubin NEGATIVE  NEG      Blood NEGATIVE  NEG      Urobilinogen 0.2 0.2 - 1.0 EU/dL    Nitrites NEGATIVE  NEG      Leukocyte Esterase NEGATIVE  NEG      WBC 0-4 0 - 4 /hpf    RBC 0-5 0 - 5 /hpf    Epithelial cells FEW FEW /lpf    Bacteria NEGATIVE  NEG /hpf    UA:UC IF INDICATED CULTURE NOT INDICATED BY UA RESULT CNI     CBC WITH AUTOMATED DIFF    Collection Time: 03/11/17 11:53 AM   Result Value Ref Range    WBC 4.6 3.6 - 11.0 K/uL    RBC 4.78 3.80 - 5.20 M/uL    HGB 11.1 (L) 11.5 - 16.0 g/dL    HCT 36.2 35.0 - 47.0 %    MCV 75.7 (L) 80.0 - 99.0 FL    MCH 23.2 (L) 26.0 - 34.0 PG    MCHC 30.7 30.0 - 36.5 g/dL    RDW 15.3 (H) 11.5 - 14.5 %    PLATELET 220 150 - 400 K/uL    MPV 10.8 8.9 - 12.9 FL    NRBC 0.0 0 PER 100 WBC    ABSOLUTE NRBC 0.00 0.00 - 0.01 K/uL    NEUTROPHILS 66 32 - 75 %    LYMPHOCYTES 20 12 - 49 %    MONOCYTES 10 5 - 13 %    EOSINOPHILS 2 0 - 7 %    BASOPHILS 1 0 - 1 %    IMMATURE GRANULOCYTES 0 0.0 - 0.5 %    ABS. NEUTROPHILS 3.0 1.8 - 8.0 K/UL    ABS. LYMPHOCYTES 0.9 0.8 - 3.5 K/UL    ABS. MONOCYTES 0.5 0.0 - 1.0 K/UL    ABS. EOSINOPHILS 0.1 0.0 - 0.4 K/UL    ABS. BASOPHILS 0.1 0.0 - 0.1 K/UL    ABS. IMM. GRANS. 0.0 0.00 - 0.04 K/UL    DF AUTOMATED     METABOLIC PANEL, COMPREHENSIVE    Collection Time: 03/11/17 11:53 AM   Result Value Ref Range    Sodium 143 136 - 145 mmol/L    Potassium 3.9 3.5 - 5.1 mmol/L    Chloride 108 97 - 108 mmol/L    CO2 28 21 - 32 mmol/L    Anion gap 7 5 - 15 mmol/L    Glucose 103 (H) 65 - 100 mg/dL    BUN 9 6 - 20 MG/DL     Creatinine 0.78 0.55 - 1.02 MG/DL    BUN/Creatinine ratio 12 12 - 20      GFR est AA >60 >60 ml/min/1.25m    GFR est non-AA >60 >60 ml/min/1.729m   Calcium 9.0 8.5 - 10.1 MG/DL  Bilirubin, total 0.4 0.2 - 1.0 MG/DL    ALT (SGPT) 24 12 - 78 U/L    AST (SGOT) 15 15 - 37 U/L    Alk. phosphatase 73 45 - 117 U/L    Protein, total 6.7 6.4 - 8.2 g/dL    Albumin 3.2 (L) 3.5 - 5.0 g/dL    Globulin 3.5 2.0 - 4.0 g/dL    A-G Ratio 0.9 (L) 1.1 - 2.2         Radiologic Studies -   No orders to display     CT Results  (Last 48 hours)    None        CXR Results  (Last 48 hours)    None            Medical Decision Making   I am the first provider for this patient.    I reviewed the vital signs, available nursing notes, past medical history, past surgical history, family history and social history.    Vital Signs-Reviewed the patient's vital signs.  Patient Vitals for the past 12 hrs:   Temp Pulse Resp BP SpO2   03/11/17 1246 - 82 17 (!) 158/97 98 %   03/11/17 1116 98.8 ??F (37.1 ??C) 83 16 (!) 172/96 98 %       Pulse Oximetry Analysis - 98% on RA    Cardiac Monitor:   Rate: 82 bpm  Rhythm: Normal Sinus Rhythm 1246     Records Reviewed: Nursing Notes and Old Medical Records    Provider Notes (Medical Decision Making):   Patient is a 83 yo female who presents to ED with dysuria.  She denies vaginal symptoms.  She is currently being treated for UTI.  Labs including CBC, CMP, UA unremarkable.  Abdominal exam benign, do not suspect acute intraabdominal process.  Will send urine culture.  No indication for additional abx at this time.  Advised follow up with PCP tomorrow as scheduled.  Discussed results, prescriptions and follow up plan with patient.  Provided customary return to ED instructions.  Patient expressed understanding.  Rip Harbour, MD    ED Course:   Initial assessment performed. The patients presenting problems have been discussed, and they are in agreement with the care plan formulated and  outlined with them.  I have encouraged them to ask questions as they arise throughout their visit.    Critical Care Time:   0 minutes    Disposition:  Discharge Note:  3:23 PM  The pt is ready for discharge. The pt's signs, symptoms, diagnosis, and discharge instructions have been discussed and pt has conveyed their understanding. The pt is to follow up as recommended or return to ER should their symptoms worsen. Plan has been discussed and pt is in agreement.      PLAN:  1.   Discharge Medication List as of 03/11/2017  3:05 PM        2.   Follow-up Information     Follow up With Details Comments Contact Info    Darlys Gales, MD  Please follow up tomorrow as scheduled [place] [address]  [phone]      [facility]  As needed, If symptoms worsen [place]  [phone]        Return to ED if worse     Diagnosis     Clinical Impression:   1. Dysuria  Attestations:    This note is prepared by Vaughan Sine. Irungu, acting as Education administrator for Ocie Cornfield, MD.    Ocie Cornfield, MD: The scribe's documentation has been prepared under my direction and personally reviewed by me in its entirety. I confirm that the note above accurately reflects all work, treatment, procedures, and medical decision making performed by me.

## 2017-03-12 ENCOUNTER — Ambulatory Visit: Admit: 2017-03-12 | Discharge: 2017-03-12 | Payer: MEDICARE | Attending: Internal Medicine | Primary: Family Medicine

## 2017-03-12 DIAGNOSIS — N390 Urinary tract infection, site not specified: Secondary | ICD-10-CM

## 2017-03-12 MED ORDER — FAMOTIDINE 20 MG TAB
20 mg | ORAL_TABLET | Freq: Two times a day (BID) | ORAL | 1 refills | Status: DC
Start: 2017-03-12 — End: 2018-02-05

## 2017-03-12 MED ORDER — EZETIMIBE 10 MG TAB
10 mg | ORAL_TABLET | Freq: Every day | ORAL | 1 refills | Status: DC
Start: 2017-03-12 — End: 2017-11-30

## 2017-03-12 NOTE — Progress Notes (Signed)
Subjective:      Kimberly [name] is a 83 y.o. female who presents today for   Chief Complaint   Patient presents with   ??? Urinary Pain     Patient seen at [facility] on 8/1 2018     Patient accompanied today by her niece and a home health aid    uti-  Has been to [facility] after complained of  dysuria, and urgency Abx -   She had possible reaction  to cefuroxime and pyridium?  Currently on course of  doxycycline  Yesterday seen at [facility] and had repeat UA and culture- negative  Pain today is 3/10. Patient says she feels much better  No fever  Vitals stable  Labs done at ER cmp and cbc stable      GERD-flaring up recently,  will refill pepcid today    Hyperlipidemia- will refill zetia    Patient Active Problem List    Diagnosis Date Noted   ??? Syncope and collapse 07/18/2016   ??? Headache disorder 07/18/2016   ??? 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 Memory loss 11/21/2014   ??? Acute bacterial conjunctivitis of right eye 11/21/2014   ??? SOB (shortness of breath) on exertion 06/21/2014   ??? Chest pain with normal angiography--~ 15 yrs ago MCV 06/21/2014   ??? Hypertension, essential, benign    ??? High cholesterol      Current Outpatient Prescriptions   Medication Sig Dispense Refill   ??? phenazopyridine (PYRIDIUM) 200 mg tablet Take  by mouth three (3) times daily as needed for Pain.     ??? cefUROXime (CEFTIN) 250 mg tablet Take 250 mg by mouth two (2) times a day.     ??? verapamil ER (CALAN-SR) 180 mg CR tablet TAKE 1 TABLET BY MOUTH EVERY NIGHT 90 Tab 0   ??? hydrocortisone (CORTEF) 10 mg tablet TK 1 T PO Q 12 H  5   ??? cilostazol (PLETAL) 100 mg tablet TAKE 1 TABLET BY MOUTH TWICE DAILY ON EMPTY STOMACH (Patient taking differently: TAKE 1 TABLET BY MOUTH  DAILY ON EMPTY STOMACH) 180 Tab 0   ??? isosorbide dinitrate (ISORDIL) 10 mg tablet TAKE 1 TABLET BY MOUTH TWICE DAILY 180 Tab 0   ??? famotidine (PEPCID) 20 mg tablet Take 1 Tab by mouth two (2) times a day. 10 Tab 0    ??? ezetimibe (ZETIA) 10 mg tablet Take 1 Tab by mouth daily. 90 Tab 1   ??? lisinopril (PRINIVIL) 10 mg tablet Take 1 Tab by mouth daily. 30 Tab 0   ??? cholecalciferol (VITAMIN D3) 1,000 unit cap Take  by mouth daily.     ??? acetaminophen (TYLENOL) 325 mg tablet Take 1 Tab by mouth three (3) times daily. 180 Tab 3   ??? gabapentin (NEURONTIN) 300 mg capsule TAKE 1 CAPSULE BY MOUTH THREE TIMES DAILY 270 Cap 1   ??? Walker misc 1 Units by Does Not Apply route daily. 1 Each 0   ??? oxybutynin (DITROPAN) 5 mg tablet TAKE 1 TABLET BY MOUTH TWICE DAILY 180 Tab 3   ??? simvastatin (ZOCOR) 5 mg tablet Take  by mouth nightly.     ??? polyethylene glycol (MIRALAX) 17 gram/dose powder TAKE 1 CAPFUL PO ONCE A DAY  11   ??? alendronate (FOSAMAX) 70 mg tablet TAKE 1 TABLET BY MOUTH EVERY 7 DAYS 12 Tab 6   ??? aspirin 81 mg chewable tablet Take 81 mg by mouth daily.     ???  coenzyme q10-vitamin e (COQ10 SG 100) 100-100 mg-unit cap Take  by mouth.     ??? omeprazole (PRILOSEC) 40 mg capsule Take 1 Cap by mouth daily. 30 Cap 0   ??? VITAMIN E MIXED (NATURAL VITAMIN E PO) Take 400 Units by mouth. 3 x week      ??? vitamin A 8,000 unit capsule Take 8,000 Units by mouth daily.       Allergies   Allergen Reactions   ??? Pcn [Penicillins] Unknown (comments)   ??? Sulfa (Sulfonamide Antibiotics) Unknown (comments)     Past Medical History:   Diagnosis Date   ??? Anginal pain ([place])    ??? Arthritis    ??? Constipation    ??? Hearing loss    ??? Hearing loss    ??? High cholesterol    ??? Hypertension    ??? Incontinence    ??? Joint pain    ??? Memory disorder    ??? Muscle pain    ??? Ringing in the ears    ??? Snoring    ??? Visual disturbance      Past Surgical History:   Procedure Laterality Date   ??? HX HYSTERECTOMY     ??? HX ORTHOPAEDIC      epidural pain mtg   ??? HX OTHER SURGICAL Right 10/15/2016    ligament correction right foot   ??? HX OTHER SURGICAL  11/26/2016    Pituitary tumor removal     Family History   Problem Relation Age of Onset   ??? Breast Cancer Mother 44    ??? Breast Cancer Sister 57   ??? Breast Cancer Daughter 31   ??? Dementia Brother    ??? Dementia Sister      Social History   Substance Use Topics   ??? Smoking status: Never Smoker   ??? Smokeless tobacco: Never Used   ??? Alcohol use No        Review of Systems    A comprehensive review of systems was negative except for that written in the HPI.     Objective:     Visit Vitals   ??? BP 120/62 (BP 1 Location: Right arm, BP Patient Position: Sitting)   ??? Pulse 86   ??? Temp 98.3 ??F (36.8 ??C) (Oral)   ??? Resp 18   ??? Ht 5\' 2"  (1.575 m)   ??? Wt 130 lb (59 kg)   ??? SpO2 95%   ??? BMI 23.78 kg/m2     General:  Alert, cooperative, no distress, appears stated age.   Head:  Normocephalic, without obvious abnormality, atraumatic.   Eyes:  Conjunctivae/corneas clear. PERRL, EOMs intact. Fundi benign.   Ears:  Normal TMs and external ear canals both ears.   Nose: Nares normal. Septum midline. Mucosa normal. No drainage or sinus tenderness.   Throat: Lips, mucosa, and tongue normal. Teeth and gums normal.   Neck: Supple, symmetrical, trachea midline, no adenopathy, thyroid: no enlargement/tenderness/nodules, no carotid bruit and no JVD.   Back:   Symmetric, no curvature. ROM normal. No CVA tenderness.   Lungs:   Clear to auscultation bilaterally.   Chest wall:  No tenderness or deformity.   Heart:  Regular rate and rhythm, S1, S2 normal, no murmur, click, rub or gallop.   Abdomen:   Soft, non-tender. Bowel sounds normal. No masses,  No organomegaly.   Extremities: Wearing braces on lower extremities   Pulses: 2+ and symmetric all extremities.   Skin: Skin color, texture, turgor normal. No  rashes or lesions.   Lymph nodes: Cervical, supraclavicular, and axillary nodes normal.   Neurologic: CNII-XII intact. Normal strength, sensation and reflexes throughout.       Assessment/Plan:       ICD-10-CM ICD-9-CM    1. Urinary tract infection without hematuria, site unspecified N39.0 599.0 Complete abx  Increase water intake    2. Gastroesophageal reflux disease without esophagitis K21.9 530.81 Refill Pepcid   3. Hyperlipidemia, unspecified hyperlipidemia type E78.5 272.4 Refill Zetia       Follow-up Disposition: Not on File   Advised her to call back or return to office if symptoms worsen/change/persist.  Discussed expected course/resolution/complications of diagnosis in detail with patient.    Medication risks/benefits/costs/interactions/alternatives discussed with patient.  She was given an after visit summary which includes diagnoses, current medications, & vitals.  She expressed understanding with the diagnosis and plan.

## 2017-03-12 NOTE — Progress Notes (Signed)
Room 3    Chief Complaint   Patient presents with   ??? Urinary Pain     Patient seen at [facility] on 8/1 2018     Patient went to [facility] on 03/06/2017 and received antibiotics and pyridium  1. Have you been to the ER, urgent care clinic since your last visit?  Hospitalized since your last visit?Yes Reason for visit: To [facility] on yesterday for urine pain    2. Have you seen or consulted any other health care providers outside of the [facility] since your last visit?  Include any pap smears or colon screening. No

## 2017-03-13 LAB — CULTURE, URINE
Colonies Counted: <10000
Colony Count: <10000
Culture result:: NO GROWTH
Culture: NO GROWTH

## 2017-03-20 ENCOUNTER — Telehealth

## 2017-03-20 NOTE — Telephone Encounter (Signed)
Laurine Blazer (nurse aide) called on the patient's behalf to request a Rx for a UTI. Patient was recently seen at [facility] on [address] for a UTI and was given medication, however, its been misplaced. Ms. Joya Gaskins was advised to contact [facility]. She is concern that the patient would have to be seen again. Is there anyway the patient can do a urine sample to get a Rx? Please advise!

## 2017-03-21 ENCOUNTER — Ambulatory Visit: Admit: 2017-03-21 | Discharge: 2017-03-21 | Payer: MEDICARE | Attending: Family | Primary: Family Medicine

## 2017-03-21 ENCOUNTER — Encounter

## 2017-03-21 DIAGNOSIS — R300 Dysuria: Secondary | ICD-10-CM

## 2017-03-21 NOTE — Progress Notes (Signed)
Patient left urine specimen. Culture and UA reflex sent

## 2017-03-21 NOTE — Telephone Encounter (Signed)
Spoke to patient and advised to come into office to leave a urine sample. A referral order to be placed for urology.   Patient will come in between 10am and 2pm.

## 2017-03-21 NOTE — Telephone Encounter (Signed)
I placed order for urinalysis and urine culture

## 2017-03-24 LAB — CULTURE, URINE

## 2017-03-24 LAB — URINALYSIS W/ RFLX MICROSCOPIC
Bilirubin: NEGATIVE
Blood: NEGATIVE
Glucose: NEGATIVE
Ketone: NEGATIVE
Leukocyte Esterase: NEGATIVE
Nitrites: NEGATIVE
Protein: NEGATIVE
Specific Gravity: 1.008 (ref 1.005–1.030)
Urobilinogen: 0.2 mg/dL (ref 0.2–1.0)
pH (UA): 6 (ref 5.0–7.5)

## 2017-04-04 MED ORDER — NITROFURANTOIN (25% MACROCRYSTAL FORM) 100 MG CAP
100 mg | ORAL_CAPSULE | Freq: Two times a day (BID) | ORAL | 0 refills | Status: DC
Start: 2017-04-04 — End: 2017-07-29

## 2017-04-04 NOTE — Telephone Encounter (Signed)
RX for macrobid was sent to patients pharmacy

## 2017-04-10 MED ORDER — CILOSTAZOL 100 MG TAB
100 mg | ORAL_TABLET | ORAL | 0 refills | Status: DC
Start: 2017-04-10 — End: 2017-07-13

## 2017-04-20 MED ORDER — NORTRIPTYLINE 10 MG CAP
10 mg | ORAL_CAPSULE | ORAL | 0 refills | Status: DC
Start: 2017-04-20 — End: 2017-07-19

## 2017-05-13 ENCOUNTER — Ambulatory Visit: Admit: 2017-05-13 | Discharge: 2017-05-13 | Payer: MEDICARE | Attending: Internal Medicine | Primary: Family Medicine

## 2017-05-13 DIAGNOSIS — G8929 Other chronic pain: Secondary | ICD-10-CM

## 2017-05-13 NOTE — Progress Notes (Signed)
Subjective:      Kimberly [name] is a 83 y.o. female who presents today for   Chief Complaint   Patient presents with   ??? Leg Pain     cotinues with right leg pain.    ??? Head Pain     headaches in the mornings.      Headaches recently have been occurring in the mornings.     bp stable  She says she is hydrating  No new meds  She takes tylenol for pain  She has seen ophthalmologist  Headache is 4-5/10  She is eating regularly      Patient Active Problem List    Diagnosis Date Noted   ??? Syncope and collapse 07/18/2016   ??? Headache disorder 07/18/2016   ??? 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 Memory loss 11/21/2014   ??? Acute bacterial conjunctivitis of right eye 11/21/2014   ??? SOB (shortness of breath) on exertion 06/21/2014   ??? Chest pain with normal angiography--~ 15 yrs ago MCV 06/21/2014   ??? Hypertension, essential, benign    ??? High cholesterol      Current Outpatient Prescriptions   Medication Sig Dispense Refill   ??? nortriptyline (PAMELOR) 10 mg capsule TAKE 1 CAPSULE BY MOUTH EVERY NIGHT 90 Cap 0   ??? cilostazol (PLETAL) 100 mg tablet TAKE 1 TABLET BY MOUTH TWICE DAILY ON EMPTY STOMACH 180 Tab 0   ??? nitrofurantoin, macrocrystal-monohydrate, (MACROBID) 100 mg capsule Take 1 Cap by mouth two (2) times a day. 14 Cap 0   ??? cefUROXime (CEFTIN) 250 mg tablet Take 250 mg by mouth two (2) times a day.     ??? famotidine (PEPCID) 20 mg tablet Take 1 Tab by mouth two (2) times a day. 180 Tab 1   ??? ezetimibe (ZETIA) 10 mg tablet Take 1 Tab by mouth daily. 90 Tab 1   ??? verapamil ER (CALAN-SR) 180 mg CR tablet TAKE 1 TABLET BY MOUTH EVERY NIGHT 90 Tab 0   ??? hydrocortisone (CORTEF) 10 mg tablet TK 1 T PO Q 12 H  5   ??? isosorbide dinitrate (ISORDIL) 10 mg tablet TAKE 1 TABLET BY MOUTH TWICE DAILY 180 Tab 0   ??? lisinopril (PRINIVIL) 10 mg tablet Take 1 Tab by mouth daily. 30 Tab 0   ??? cholecalciferol (VITAMIN D3) 1,000 unit cap Take  by mouth daily.     ??? acetaminophen (TYLENOL) 325 mg tablet Take 1 Tab by mouth three (3)  times daily. 180 Tab 3   ??? gabapentin (NEURONTIN) 300 mg capsule TAKE 1 CAPSULE BY MOUTH THREE TIMES DAILY 270 Cap 1   ??? Walker misc 1 Units by Does Not Apply route daily. 1 Each 0   ??? oxybutynin (DITROPAN) 5 mg tablet TAKE 1 TABLET BY MOUTH TWICE DAILY 180 Tab 3   ??? simvastatin (ZOCOR) 5 mg tablet Take  by mouth nightly.     ??? polyethylene glycol (MIRALAX) 17 gram/dose powder TAKE 1 CAPFUL PO ONCE A DAY  11   ??? alendronate (FOSAMAX) 70 mg tablet TAKE 1 TABLET BY MOUTH EVERY 7 DAYS 12 Tab 6   ??? aspirin 81 mg chewable tablet Take 81 mg by mouth daily.     ??? coenzyme q10-vitamin e (COQ10 SG 100) 100-100 mg-unit cap Take  by mouth.     ??? omeprazole (PRILOSEC) 40 mg capsule Take 1 Cap by mouth daily. 30 Cap 0   ??? VITAMIN E MIXED (NATURAL VITAMIN E PO) Take 400  Units by mouth. 3 x week      ??? vitamin A 8,000 unit capsule Take 8,000 Units by mouth daily.       Allergies   Allergen Reactions   ??? Pcn [Penicillins] Unknown (comments)   ??? Sulfa (Sulfonamide Antibiotics) Unknown (comments)     Past Medical History:   Diagnosis Date   ??? Anginal pain ([place])    ??? Arthritis    ??? Constipation    ??? Hearing loss    ??? Hearing loss    ??? High cholesterol    ??? Hypertension    ??? Incontinence    ??? Joint pain    ??? Memory disorder    ??? Muscle pain    ??? Ringing in the ears    ??? Snoring    ??? Visual disturbance      Past Surgical History:   Procedure Laterality Date   ??? HX HYSTERECTOMY     ??? HX ORTHOPAEDIC      epidural pain mtg   ??? HX OTHER SURGICAL Right 10/15/2016    ligament correction right foot   ??? HX OTHER SURGICAL  11/26/2016    Pituitary tumor removal     Family History   Problem Relation Age of Onset   ??? Breast Cancer Mother 76   ??? Breast Cancer Sister 62   ??? Breast Cancer Daughter 59   ??? Dementia Brother    ??? Dementia Sister      Social History   Substance Use Topics   ??? Smoking status: Never Smoker   ??? Smokeless tobacco: Never Used   ??? Alcohol use No        Review of Systems     A comprehensive review of systems was negative except for that written in the HPI.     Objective:     Visit Vitals   ??? BP 132/68   ??? Pulse 73   ??? Temp 97.8 ??F (36.6 ??C) (Oral)   ??? Resp 18   ??? Ht 5\' 2"  (1.575 m)   ??? Wt 130 lb 14.4 oz (59.4 kg)   ??? BMI 23.94 kg/m2     General:  Alert, cooperative, no distress, appears stated age.   Head:  Normocephalic, without obvious abnormality, atraumatic.   Eyes:  Conjunctivae/corneas clear. PERRL, EOMs intact. Fundi benign.   Ears:  Normal TMs and external ear canals both ears.   Nose: Nares normal. Septum midline. Mucosa normal. No drainage or sinus tenderness.   Throat: Lips, mucosa, and tongue normal. Teeth and gums normal.   Neck: Supple, symmetrical, trachea midline, no adenopathy, thyroid: no enlargement/tenderness/nodules, no carotid bruit and no JVD.   Back:   Symmetric, no curvature. ROM normal. No CVA tenderness.   Lungs:   Clear to auscultation bilaterally.   Chest wall:  No tenderness or deformity.   Heart:  Regular rate and rhythm, S1, S2 normal, no murmur, click, rub or gallop.   Abdomen:   Soft, non-tender. Bowel sounds normal. No masses,  No organomegaly.   Extremities: Extremities normal, atraumatic, no cyanosis or edema.   Pulses: 2+ and symmetric all extremities.   Skin: Skin color, texture, turgor normal. No rashes or lesions.   Lymph nodes: Cervical, supraclavicular, and axillary nodes normal.   Neurologic: CNII-XII intact. Normal strength, sensation and reflexes throughout.       Assessment/Plan:       ICD-10-CM ICD-9-CM    1. Chronic intractable headache, unspecified headache type R51 784.0 CT HEAD WO  CONT to rule out bleed or infarct   2. Right leg pain M79.604 729.5 Chronic pain in right leg. Mild swelling noted at the site of her leg brace.  Advised removing brace   3 BP well controlled on current antihypertensives    Follow-up Disposition: Not on File   Advised her to call back or return to office if symptoms worsen/change/persist.   Discussed expected course/resolution/complications of diagnosis in detail with patient.    Medication risks/benefits/costs/interactions/alternatives discussed with patient.  She was given an after visit summary which includes diagnoses, current medications, & vitals.  She expressed understanding with the diagnosis and plan.

## 2017-05-13 NOTE — Progress Notes (Signed)
Chief Complaint   Patient presents with   ??? Leg Pain     cotinues with right leg pain.    ??? Head Pain     headaches in the mornings.      1. Have you been to the ER, urgent care clinic since your last visit?  Hospitalized since your last visit?No    2. Have you seen or consulted any other health care providers outside of the [facility] since your last visit?  Include any pap smears or colon screening. No

## 2017-05-14 MED ORDER — GABAPENTIN 100 MG CAP
100 mg | ORAL_CAPSULE | ORAL | 0 refills | Status: DC
Start: 2017-05-14 — End: 2017-05-22

## 2017-05-15 ENCOUNTER — Inpatient Hospital Stay: Admit: 2017-05-15 | Payer: MEDICARE | Attending: Internal Medicine | Primary: Family Medicine

## 2017-05-15 ENCOUNTER — Ambulatory Visit

## 2017-05-15 DIAGNOSIS — R51 Headache: Secondary | ICD-10-CM

## 2017-05-15 NOTE — Telephone Encounter (Signed)
Returned call. Patient is to call scheduling to schedule ct scan.

## 2017-05-15 NOTE — Telephone Encounter (Signed)
-----   Message from [place] sent at 05/15/2017 10:22 AM EDT -----  Regarding: Dr.Latham/[place]  Contact: [phone]  Pt returned call from the nurse. Best contact [phone]

## 2017-05-17 NOTE — Telephone Encounter (Signed)
*   Contact is not on patients HIPPA permission to release form.*  Contact Remo Lipps) says she lives with the patient and need to discuss with Dr. Cira Servant pt's meds? Contact phone# [phone].* Contact is not on patients HIPPA permission to release form.*

## 2017-05-20 NOTE — Telephone Encounter (Signed)
Message left for patient

## 2017-05-20 NOTE — Telephone Encounter (Signed)
-----   Message from Deidre Ala sent at 05/20/2017  1:38 PM EDT -----  Regarding: Dr. Cira Servant Telephone  Ms. Tommye Standard, Pt nurse, requesting a call back in regards to Ct scan done on Oct 11,2018 . Ms. Joya Gaskins best contact number is [phone].

## 2017-05-21 NOTE — Telephone Encounter (Signed)
Call returned to patient. Message left for return call from patient

## 2017-05-21 NOTE — Telephone Encounter (Signed)
Spoke to patient, verified DOB . Advised her of the results of the CT scan and as per Dr. Alcide Evener recommendation to have her family schedule an appointment with the neurologist. Care giver was listening and repeated the information given to the patient.

## 2017-05-25 MED ORDER — ALENDRONATE 70 MG TAB
70 mg | ORAL_TABLET | ORAL | 3 refills | Status: DC
Start: 2017-05-25 — End: 2017-07-29

## 2017-05-27 ENCOUNTER — Inpatient Hospital Stay
Admit: 2017-05-27 | Discharge: 2017-05-27 | Disposition: A | Discharge status: Home / Self Care | Payer: MEDICARE | Attending: Emergency Medicine

## 2017-05-27 ENCOUNTER — Emergency Department: Admit: 2017-05-27 | Payer: MEDICARE | Primary: Family Medicine

## 2017-05-27 DIAGNOSIS — N3000 Acute cystitis without hematuria: Secondary | ICD-10-CM

## 2017-05-27 LAB — EKG 12-LEAD
Atrial Rate: 70 {beats}/min
Diagnosis: NORMAL
P Axis: 43 degrees
P-R Interval: 134 ms
Q-T Interval: 418 ms
QRS Duration: 98 ms
QTc Calculation (Bazett): 451 ms
R Axis: -4 degrees
T Axis: 130 degrees
Ventricular Rate: 70 {beats}/min

## 2017-05-27 LAB — EKG, 12 LEAD, INITIAL
Atrial Rate: 70 {beats}/min
Calculated P Axis: 43 degrees
Calculated R Axis: -4 degrees
Calculated T Axis: 130 degrees
Diagnosis: NORMAL
P-R Interval: 134 ms
Q-T Interval: 418 ms
QRS Duration: 98 ms
QTC Calculation (Bezet): 451 ms
Ventricular Rate: 70 {beats}/min

## 2017-05-27 LAB — CK W/ REFLX CKMB: CK: 135 U/L (ref 26–192)

## 2017-05-27 LAB — URINALYSIS W/ REFLEX CULTURE
Bilirubin: NEGATIVE
Glucose: NEGATIVE mg/dL
Ketone: NEGATIVE mg/dL
Nitrites: NEGATIVE
Protein: NEGATIVE mg/dL
Specific gravity: 1.006 (ref 1.003–1.030)
Urobilinogen: 0.2 EU/dL (ref 0.2–1.0)
WBC: >100 /hpf — ABNORMAL HIGH (ref 0–4)
pH (UA): 6.5 (ref 5.0–8.0)

## 2017-05-27 LAB — METABOLIC PANEL, BASIC
Anion gap: 8 mmol/L (ref 5–15)
BUN/Creatinine ratio: 20 (ref 12–20)
BUN: 18 MG/DL (ref 6–20)
CO2: 26 mmol/L (ref 21–32)
Calcium: 9.1 MG/DL (ref 8.5–10.1)
Chloride: 105 mmol/L (ref 97–108)
Creatinine: 0.9 MG/DL (ref 0.55–1.02)
GFR est AA: >60 mL/min/{1.73_m2} (ref >60)
GFR est non-AA: 59 mL/min/{1.73_m2} — ABNORMAL LOW (ref >60)
Glucose: 97 mg/dL (ref 65–100)
Potassium: 4.6 mmol/L (ref 3.5–5.1)
Sodium: 139 mmol/L (ref 136–145)

## 2017-05-27 LAB — CBC WITH AUTOMATED DIFF
ABS. BASOPHILS: 0.1 10*3/uL (ref 0.0–0.1)
ABS. EOSINOPHILS: 0.1 10*3/uL (ref 0.0–0.4)
ABS. IMM. GRANS.: 0 10*3/uL (ref 0.00–0.04)
ABS. LYMPHOCYTES: 1.3 10*3/uL (ref 0.8–3.5)
ABS. MONOCYTES: 0.7 10*3/uL (ref 0.0–1.0)
ABS. NEUTROPHILS: 3.6 10*3/uL (ref 1.8–8.0)
ABSOLUTE NRBC: 0 10*3/uL (ref 0.00–0.01)
BASOPHILS: 1 % (ref 0–1)
EOSINOPHILS: 1 % (ref 0–7)
HCT: 40 % (ref 35.0–47.0)
HGB: 12.5 g/dL (ref 11.5–16.0)
IMMATURE GRANULOCYTES: 1 % — ABNORMAL HIGH (ref 0.0–0.5)
LYMPHOCYTES: 23 % (ref 12–49)
MCH: 23.3 PG — ABNORMAL LOW (ref 26.0–34.0)
MCHC: 31.3 g/dL (ref 30.0–36.5)
MCV: 74.6 FL — ABNORMAL LOW (ref 80.0–99.0)
MONOCYTES: 12 % (ref 5–13)
MPV: 10.4 FL (ref 8.9–12.9)
NEUTROPHILS: 63 % (ref 32–75)
NRBC: 0 PER 100 WBC
PLATELET: 271 10*3/uL (ref 150–400)
RBC: 5.36 M/uL — ABNORMAL HIGH (ref 3.80–5.20)
RDW: 14.6 % — ABNORMAL HIGH (ref 11.5–14.5)
WBC: 5.7 10*3/uL (ref 3.6–11.0)

## 2017-05-27 LAB — TROPONIN I
Troponin-I, Qt.: <0.05 ng/mL (ref <0.05)
Troponin-I, Qt.: <0.05 ng/mL (ref <0.05)

## 2017-05-27 MED ORDER — SODIUM CHLORIDE 0.9 % IJ SYRG
INTRAMUSCULAR | Status: DC | PRN
Start: 2017-05-27 — End: 2017-05-27

## 2017-05-27 MED ORDER — OXYCODONE-ACETAMINOPHEN 5 MG-325 MG TAB
5-325 mg | ORAL | Status: AC
Start: 2017-05-27 — End: 2017-05-27
  Administered 2017-05-27: 07:00:00 via ORAL

## 2017-05-27 MED ORDER — CEPHALEXIN 250 MG CAP
250 mg | ORAL | Status: AC
Start: 2017-05-27 — End: 2017-05-27
  Administered 2017-05-27: 07:00:00 via ORAL

## 2017-05-27 MED ORDER — SODIUM CHLORIDE 0.9 % IJ SYRG
Freq: Three times a day (TID) | INTRAMUSCULAR | Status: DC
Start: 2017-05-27 — End: 2017-05-27
  Administered 2017-05-27: 04:00:00 via INTRAVENOUS

## 2017-05-27 MED ORDER — CEPHALEXIN 500 MG CAP
500 mg | ORAL_CAPSULE | Freq: Four times a day (QID) | ORAL | 0 refills | Status: AC
Start: 2017-05-27 — End: 2017-06-03

## 2017-05-27 MED FILL — OXYCODONE-ACETAMINOPHEN 5 MG-325 MG TAB: 5-325 mg | ORAL | Qty: 1

## 2017-05-27 MED FILL — CEPHALEXIN 250 MG CAP: 250 mg | ORAL | Qty: 2

## 2017-05-27 NOTE — ED Triage Notes (Signed)
Care of patient assumed from EMS. Patient presents with complaints of chest pain. According to EMS, patient with normal EKG.

## 2017-05-27 NOTE — Progress Notes (Signed)
Discharged on Keflex. Await further speciation and sensitivities

## 2017-05-27 NOTE — ED Provider Notes (Signed)
EMERGENCY DEPARTMENT HISTORY AND PHYSICAL EXAM           Date: 05/27/2017  [name]    History of Presenting Illness     Chief Complaint   Patient presents with   ??? Chest Pain     Left lateral axillary pain ekg normal       History Provided By: Patient and EMS    HPI: Kimberly [name] is a 83 y.o. female, pmhx HTN / HLD / angina, who presents via EMS to the ED c/o gradually worsening L sided CP that radiates into her L axilla that began throughout the evening yesterday. Pt denies any hx of similar sxs, noting her current sxs are different than her usual angina. She denies any associated sxs. Pt notes her pain is exacerbated with abduction of her LUE. She notes using a can to ambulate at baseline. Pt denies any recent medications for her current sxs. She denies any recent follow up with her PCP or cardiologist. She otherwise specifically denies any recent fevers, chills, nausea, vomiting, diarrhea, abd pain, SOB, urinary sxs, changes in BM, or headache.     PCP: Darlys Gales, MD  Cardiology: Sampson Goon    PMHx: Significant for angina, HTN, HLD  PSHx: Significant for hysterectomy, orthopedic surgery  Social Hx: -tobacco, -EtOH, -Illicit Drugs    There are no other complaints, changes, or physical findings at this time.     Current Facility-Administered Medications   Medication Dose Route Frequency Provider Last Rate Last Dose   ??? sodium chloride (NS) flush 5-10 mL  5-10 mL IntraVENous Q8H O'Bier, April N, MD       ??? sodium chloride (NS) flush 5-10 mL  5-10 mL IntraVENous PRN O'Bier, April N, MD         Current Outpatient Medications   Medication Sig Dispense Refill   ??? cephALEXin (KEFLEX) 500 mg capsule Take 1 Cap by mouth four (4) times daily for 7 days. 28 Cap 0   ??? alendronate (FOSAMAX) 70 mg tablet TAKE 1 TABLET BY MOUTH EVERY 7 DAYS 4 Tab 3   ??? nortriptyline (PAMELOR) 10 mg capsule TAKE 1 CAPSULE BY MOUTH EVERY NIGHT 90 Cap 0    ??? cilostazol (PLETAL) 100 mg tablet TAKE 1 TABLET BY MOUTH TWICE DAILY ON EMPTY STOMACH 180 Tab 0   ??? nitrofurantoin, macrocrystal-monohydrate, (MACROBID) 100 mg capsule Take 1 Cap by mouth two (2) times a day. 14 Cap 0   ??? cefUROXime (CEFTIN) 250 mg tablet Take 250 mg by mouth two (2) times a day.     ??? famotidine (PEPCID) 20 mg tablet Take 1 Tab by mouth two (2) times a day. 180 Tab 1   ??? ezetimibe (ZETIA) 10 mg tablet Take 1 Tab by mouth daily. 90 Tab 1   ??? verapamil ER (CALAN-SR) 180 mg CR tablet TAKE 1 TABLET BY MOUTH EVERY NIGHT 90 Tab 0   ??? hydrocortisone (CORTEF) 10 mg tablet TK 1 T PO Q 12 H  5   ??? isosorbide dinitrate (ISORDIL) 10 mg tablet TAKE 1 TABLET BY MOUTH TWICE DAILY 180 Tab 0   ??? lisinopril (PRINIVIL) 10 mg tablet Take 1 Tab by mouth daily. 30 Tab 0   ??? cholecalciferol (VITAMIN D3) 1,000 unit cap Take  by mouth daily.     ??? acetaminophen (TYLENOL) 325 mg tablet Take 1 Tab by mouth three (3) times daily. 180 Tab 3   ??? gabapentin (NEURONTIN) 300 mg capsule TAKE  1 CAPSULE BY MOUTH THREE TIMES DAILY 270 Cap 1   ??? Walker misc 1 Units by Does Not Apply route daily. 1 Each 0   ??? oxybutynin (DITROPAN) 5 mg tablet TAKE 1 TABLET BY MOUTH TWICE DAILY 180 Tab 3   ??? simvastatin (ZOCOR) 5 mg tablet Take  by mouth nightly.     ??? polyethylene glycol (MIRALAX) 17 gram/dose powder TAKE 1 CAPFUL PO ONCE A DAY  11   ??? alendronate (FOSAMAX) 70 mg tablet TAKE 1 TABLET BY MOUTH EVERY 7 DAYS 12 Tab 6   ??? aspirin 81 mg chewable tablet Take 81 mg by mouth daily.     ??? coenzyme q10-vitamin e (COQ10 SG 100) 100-100 mg-unit cap Take  by mouth.     ??? omeprazole (PRILOSEC) 40 mg capsule Take 1 Cap by mouth daily. 30 Cap 0   ??? VITAMIN E MIXED (NATURAL VITAMIN E PO) Take 400 Units by mouth. 3 x week      ??? vitamin A 8,000 unit capsule Take 8,000 Units by mouth daily.         Past History     Past Medical History:  Past Medical History:   Diagnosis Date   ??? Anginal pain ([place])    ??? Arthritis    ??? Constipation    ??? Hearing loss     ??? Hearing loss    ??? High cholesterol    ??? Hypertension    ??? Incontinence    ??? Joint pain    ??? Memory disorder    ??? Muscle pain    ??? Ringing in the ears    ??? Snoring    ??? Visual disturbance        Past Surgical History:  Past Surgical History:   Procedure Laterality Date   ??? HX HYSTERECTOMY     ??? HX ORTHOPAEDIC      epidural pain mtg   ??? HX OTHER SURGICAL Right 10/15/2016    ligament correction right foot   ??? HX OTHER SURGICAL  11/26/2016    Pituitary tumor removal       Family History:  Family History   Problem Relation Age of Onset   ??? Breast Cancer Mother 4   ??? Breast Cancer Sister 27   ??? Breast Cancer Daughter 66   ??? Dementia Brother    ??? Dementia Sister        Social History:  Social History     Tobacco Use   ??? Smoking status: Never Smoker   ??? Smokeless tobacco: Never Used   Substance Use Topics   ??? Alcohol use: No   ??? Drug use: No       Allergies:  Allergies   Allergen Reactions   ??? Pcn [Penicillins] Unknown (comments)   ??? Sulfa (Sulfonamide Antibiotics) Unknown (comments)         Review of Systems   Review of Systems   Constitutional: Negative.  Negative for fever.   Eyes: Negative.    Respiratory: Negative.  Negative for shortness of breath.    Cardiovascular: Positive for chest pain.   Gastrointestinal: Negative for abdominal pain, nausea and vomiting.   Endocrine: Negative.    Genitourinary: Negative.  Negative for difficulty urinating, dysuria and hematuria.   Musculoskeletal: Negative.    Skin: Negative.    Allergic/Immunologic: Negative.    Neurological: Negative.    Psychiatric/Behavioral: Negative for suicidal ideas.   All other systems reviewed and are negative.      Physical  Exam   Physical Exam   Constitutional: She is oriented to person, place, and time. She appears well-developed and well-nourished. No distress.   HENT:   Head: Normocephalic and atraumatic.   Nose: Nose normal.   Eyes: Conjunctivae and EOM are normal. No scleral icterus.    Neck: Normal range of motion. No tracheal deviation present.   Cardiovascular: Normal rate, regular rhythm, normal heart sounds and intact distal pulses. Exam reveals no friction rub.   No murmur heard.  Pulmonary/Chest: Effort normal and breath sounds normal. No stridor. No respiratory distress. She has no wheezes. She has no rales.   Abdominal: Soft. Bowel sounds are normal. She exhibits no distension. There is no tenderness. There is no rebound.   Musculoskeletal: Normal range of motion. She exhibits no tenderness.   Neurological: She is alert and oriented to person, place, and time. No cranial nerve deficit.   Skin: Skin is warm and dry. No rash noted. She is not diaphoretic.   Psychiatric: She has a normal mood and affect. Her speech is normal and behavior is normal. Judgment and thought content normal. Cognition and memory are normal.   Nursing note and vitals reviewed.        Diagnostic Study Results     Labs -     Recent Results (from the past 12 hour(s))   EKG, 12 LEAD, INITIAL    Collection Time: 05/27/17 12:31 AM   Result Value Ref Range    Ventricular Rate 70 BPM    Atrial Rate 70 BPM    P-R Interval 134 ms    QRS Duration 98 ms    Q-T Interval 418 ms    QTC Calculation (Bezet) 451 ms    Calculated P Axis 43 degrees    Calculated R Axis -4 degrees    Calculated T Axis 130 degrees    Diagnosis       Normal sinus rhythm  T wave abnormality, consider lateral ischemia  When compared with ECG of 10-May-2013 07:38,  T wave inversion more evident in Anterolateral leads     TROPONIN I    Collection Time: 05/27/17 12:38 AM   Result Value Ref Range    Troponin-I, Qt. <0.05 <0.05 ng/mL   CK W/ REFLX CKMB    Collection Time: 05/27/17 12:38 AM   Result Value Ref Range    CK 135 26 - 161 U/L   METABOLIC PANEL, BASIC    Collection Time: 05/27/17 12:38 AM   Result Value Ref Range    Sodium 139 136 - 145 mmol/L    Potassium 4.6 3.5 - 5.1 mmol/L    Chloride 105 97 - 108 mmol/L    CO2 26 21 - 32 mmol/L     Anion gap 8 5 - 15 mmol/L    Glucose 97 65 - 100 mg/dL    BUN 18 6 - 20 MG/DL    Creatinine 0.90 0.55 - 1.02 MG/DL    BUN/Creatinine ratio 20 12 - 20      GFR est AA >60 >60 ml/min/1.59m2    GFR est non-AA 59 (L) >60 ml/min/1.4m2    Calcium 9.1 8.5 - 10.1 MG/DL   CBC WITH AUTOMATED DIFF    Collection Time: 05/27/17 12:38 AM   Result Value Ref Range    WBC 5.7 3.6 - 11.0 K/uL    RBC 5.36 (H) 3.80 - 5.20 M/uL    HGB 12.5 11.5 - 16.0 g/dL    HCT 40.0 35.0 - 47.0 %  MCV 74.6 (L) 80.0 - 99.0 FL    MCH 23.3 (L) 26.0 - 34.0 PG    MCHC 31.3 30.0 - 36.5 g/dL    RDW 14.6 (H) 11.5 - 14.5 %    PLATELET 271 150 - 400 K/uL    MPV 10.4 8.9 - 12.9 FL    NRBC 0.0 0 PER 100 WBC    ABSOLUTE NRBC 0.00 0.00 - 0.01 K/uL    NEUTROPHILS 63 32 - 75 %    LYMPHOCYTES 23 12 - 49 %    MONOCYTES 12 5 - 13 %    EOSINOPHILS 1 0 - 7 %    BASOPHILS 1 0 - 1 %    IMMATURE GRANULOCYTES 1 (H) 0.0 - 0.5 %    ABS. NEUTROPHILS 3.6 1.8 - 8.0 K/UL    ABS. LYMPHOCYTES 1.3 0.8 - 3.5 K/UL    ABS. MONOCYTES 0.7 0.0 - 1.0 K/UL    ABS. EOSINOPHILS 0.1 0.0 - 0.4 K/UL    ABS. BASOPHILS 0.1 0.0 - 0.1 K/UL    ABS. IMM. GRANS. 0.0 0.00 - 0.04 K/UL    DF AUTOMATED     URINALYSIS W/ REFLEX CULTURE    Collection Time: 05/27/17 12:38 AM   Result Value Ref Range    Color YELLOW/STRAW      Appearance CLOUDY (A) CLEAR      Specific gravity 1.006 1.003 - 1.030      pH (UA) 6.5 5.0 - 8.0      Protein NEGATIVE  NEG mg/dL    Glucose NEGATIVE  NEG mg/dL    Ketone NEGATIVE  NEG mg/dL    Bilirubin NEGATIVE  NEG      Blood SMALL (A) NEG      Urobilinogen 0.2 0.2 - 1.0 EU/dL    Nitrites NEGATIVE  NEG      Leukocyte Esterase LARGE (A) NEG      WBC >100 (H) 0 - 4 /hpf    RBC 0-5 0 - 5 /hpf    Epithelial cells FEW FEW /lpf    Bacteria 4+ (A) NEG /hpf    UA:UC IF INDICATED URINE CULTURE ORDERED (A) CNI      Hyaline cast 0-2 0 - 5 /lpf   TROPONIN I    Collection Time: 05/27/17  4:15 AM   Result Value Ref Range    Troponin-I, Qt. <0.05 <0.05 ng/mL       Radiologic Studies -      CXR Results  (Last 48 hours)               05/27/17 0238  XR CHEST PORT Final result    Impression:  IMPRESSION:   No acute process.            Narrative:  INDICATION:   cp left lateral axillary pain       EXAM:  AP CHEST RADIOGRAPH       COMPARISON: May 10, 2013       FINDINGS:       AP portable view of the chest demonstrates a normal cardiomediastinal   silhouette. The lungs are adequately expanded.  There is no edema, effusion,   consolidation, or pneumothorax.  The osseous structures are unremarkable.                   Medical Decision Making   I am the first provider for this patient.    I reviewed the vital signs, available nursing notes, past medical history, past surgical  history, family history and social history.    Vital Signs-Reviewed the patient's vital signs.  No data found.    Pulse Oximetry Analysis - 96% on RA    Cardiac Monitor:   Rate: 78 bpm  Rhythm: Normal Sinus Rhythm     Records Reviewed: Nursing Notes, Old Medical Records, Previous electrocardiograms, Ambulance Run Sheet, Previous Radiology Studies and Previous Laboratory Studies    Provider Notes (Medical Decision Making):     DDX:  Acs, pna, pleural effusion, uti    Plan:  Ekg, labs, uti    Impression:  Uti, atypical chest pain    ED Course:   Initial assessment performed. The patients presenting problems have been discussed, and they are in agreement with the care plan formulated and outlined with them.  I have encouraged them to ask questions as they arise throughout their visit.    I reviewed our electronic medical record system for any past medical records that were available that may contribute to the patients current condition, the nursing notes and and vital signs from today's visit    Nursing notes will be reviewed as they become available in realtime while the pt has been in the ED.  Glean Hess, MD    EKG interpretation 0031: NSR, nl Axis, rate 70; PR 134, QRS 98, QTc 451; no acute ischemia; Glean Hess, MD     I personally reviewed pt's imaging.  Official read by radiology noted above.  Glean Hess, MD    PROGRESS NOTE:  2:09 AM  Pt with noted UTI. Will tx with Keflex. Will continue to monitor while awaiting CXR.   Written by Donny Pique, ED Scribe, as dictated by Glean Hess, MD    5:22 AM  Progress note:  Pt noted to be feeling better, ready for discharge. Discussed lab and imaging findings with pt and/or family, specifically noting uti. Pt will follow up as instructed. All questions have been answered, pt voiced understanding and agreement with plan.     If narcotics were prescribed, pt was advised not to drive or operate heavy machinery. If abx were prescribed, pt advised that diarrhea and rash are possible side effects of the medications.     Specific return precautions provided in addition to instructions for pt to return to the ED immediately should sx worsen at any time.  Glean Hess, MD      Diagnosis     Clinical Impression:   1. Acute cystitis without hematuria    2. Atypical chest pain        PLAN:  1.   Current Discharge Medication List      START taking these medications    Details   cephALEXin (KEFLEX) 500 mg capsule Take 1 Cap by mouth four (4) times daily for 7 days.  Qty: 28 Cap, Refills: 0           2.   Follow-up Information     Follow up With Specialties Details Why Contact Info    Darlys Gales, MD Internal Medicine Schedule an appointment as soon as possible for a visit in 2 days  [address]  [address]  [place] [address]  [phone]      Lawerance Bach, MD Cardiology, [facility], Internal Medicine Call today  [address]  [place] [address]  [phone]          Return to ED if worse     Disposition:  DISCHARGE NOTE:  5:22 AM  The patient's results have been reviewed with family and/or caregiver. They verbally convey their understanding and agreement of the patient's  signs, symptoms, diagnosis, treatment, and prognosis. They additionally agree to follow up as recommended in the discharge instructions or to return to the Emergency Room should the patient's condition change prior to their follow-up appointment. The family and/or caregiver verbally agrees with the care-plan and all of their questions have been answered. The discharge instructions have also been provided to the them along with educational information regarding the patient's diagnosis and a list of reasons why the patient would want to return to the ER prior to their follow-up appointment should their condition change.  Written by Donny Pique, ED Scribe, as dictated by Glean Hess, MD.             Attestations:    This note is prepared by Donny Pique, acting as Glean Hess, MD    Glean Hess, MD : The scribe's documentation has been prepared under my direction and personally reviewed by me in its entirety. I confirm that the note above accurately reflects all work, treatment, procedures, and medical decision making performed by me.    This note will not be viewable in [facility].

## 2017-05-27 NOTE — Progress Notes (Signed)
Treated appropriately.  Maki Sweetser E Sindia Kowalczyk, PA-C

## 2017-05-29 LAB — CULTURE, URINE
Colonies Counted: >100000
Colony Count: >100000

## 2017-06-16 MED ORDER — VERAPAMIL ER 180 MG TAB
180 mg | ORAL_TABLET | ORAL | 0 refills | Status: DC
Start: 2017-06-16 — End: 2018-06-01

## 2017-07-12 ENCOUNTER — Inpatient Hospital Stay
Admit: 2017-07-12 | Discharge: 2017-07-12 | Disposition: A | Discharge status: Home / Self Care | Payer: MEDICARE | Attending: Emergency Medicine

## 2017-07-12 DIAGNOSIS — G629 Polyneuropathy, unspecified: Secondary | ICD-10-CM

## 2017-07-12 LAB — URINALYSIS W/ REFLEX CULTURE
Bacteria: NEGATIVE /hpf
Bilirubin: NEGATIVE
Blood: NEGATIVE
Glucose: NEGATIVE mg/dL
Ketone: NEGATIVE mg/dL
Nitrites: NEGATIVE
Protein: NEGATIVE mg/dL
Specific gravity: 1.008 (ref 1.003–1.030)
Urobilinogen: 0.2 EU/dL (ref 0.2–1.0)
pH (UA): 5.5 (ref 5.0–8.0)

## 2017-07-12 LAB — POC CHEM8
Anion gap (POC): 17 mmol/L (ref 10–20)
BUN (POC): 13 mg/dL (ref 9–20)
CO2 (POC): 26 mmol/L (ref 21–32)
Calcium, ionized (POC): 1.21 mmol/L (ref 1.12–1.32)
Chloride (POC): 103 mmol/L (ref 98–107)
Creatinine (POC): 0.7 mg/dL (ref 0.6–1.3)
GFRAA, POC: >60 mL/min/{1.73_m2} (ref >60)
GFRNA, POC: >60 mL/min/{1.73_m2} (ref >60)
Glucose (POC): 92 mg/dL (ref 65–100)
Hematocrit (POC): 38 % (ref 35.0–47.0)
Potassium (POC): 4.2 mmol/L (ref 3.5–5.1)
Sodium (POC): 140 mmol/L (ref 136–145)

## 2017-07-12 MED ORDER — OXYCODONE 5 MG TAB
5 mg | ORAL_TABLET | ORAL | 0 refills | Status: DC | PRN
Start: 2017-07-12 — End: 2017-07-29

## 2017-07-12 MED ORDER — OXYCODONE 5 MG TAB
5 mg | ORAL | Status: AC
Start: 2017-07-12 — End: 2017-07-12
  Administered 2017-07-12: 22:00:00 via ORAL

## 2017-07-12 MED ORDER — GABAPENTIN 300 MG CAP
300 mg | Freq: Once | ORAL | Status: DC
Start: 2017-07-12 — End: 2017-07-12

## 2017-07-12 MED ORDER — TRAMADOL 50 MG TAB
50 mg | ORAL | Status: AC
Start: 2017-07-12 — End: 2017-07-12
  Administered 2017-07-12: 21:00:00 via ORAL

## 2017-07-12 MED FILL — GABAPENTIN 300 MG CAP: 300 mg | ORAL | Qty: 1

## 2017-07-12 MED FILL — OXYCODONE 5 MG TAB: 5 mg | ORAL | Qty: 1

## 2017-07-12 MED FILL — TRAMADOL 50 MG TAB: 50 mg | ORAL | Qty: 1

## 2017-07-12 NOTE — ED Notes (Signed)
Pt discharged by MD/PA with discharge paperwork, pt a/o x 4 at time of d/c.

## 2017-07-12 NOTE — ED Provider Notes (Signed)
EMERGENCY DEPARTMENT HISTORY AND PHYSICAL EXAM      Date: 07/12/2017  [name]     PROVIDER IN TRIAGE NOTE:  1:53 PM  Johnella Moloney, PA-C has evaluated the patient as the Provider in Triage (PIT) for burning all over her body that is progressively worsening. The vital signs and the triage nurse assessment have been reviewed. The patient and any available family have been advised that the appropriate studies have been ordered to initiate the work up based on the clinical presentation during the assessment. The pt has been advised that they will be accommodated in non-emergent medical needs as soon as possible. The pt has been requested to contact the triage nurse or PIT immediately if they experiences any changes in their condition during this brief waiting period.    History of Presenting Illness     Chief Complaint   Patient presents with   ??? Other     states intermittent burning sensation all over her body x 3 months. saw PCP, referred to neurologist and given rx that helped. for the past 3 days or more the sensation has increased. denies chest pain, sob, no numbness or tingling       History Provided By: Patient    HPI: Kimberly [name], 83 y.o. female with PMHx significant for HTN, high cholesterol, arthritis, presents ambulatory to the ED with cc of constant "burning sensation" all over body x a few weeks. She reports associated dysuria. Pt denies any alleviating and exacerbating factors. She notes hx of similar symptoms with UTI. She states she is followed by neurology and urology for symptoms. She notes she was started on gabapentin by neurology for similar burning sensation. Pt denies hx of DM. Pt specifically denies any fever, chills.     Additionally, pt specifically denies any recent fever, chills, headache, nausea, vomiting, diarrhea, abdominal pain, CP, SOB, lightheadedness, dizziness, weakness, tingling, BLE swelling, heart palpitations, changes  in BM, changes in PO intake, melena, hematochezia, cough, or congestion.     PCP: Darlys Gales, MD   Neurology: Dr. Fran Lowes    PMHx: Significant for HTN, high cholesterol, arthritis  PSHx: Significant for hysterectomy, pituitary tumor removal  Social Hx: tobacco (-), - EtOH, Illicit Drugs (-)    There are no other complaints, changes or physical findings at this time.     Past History     Past Medical History:  Past Medical History:   Diagnosis Date   ??? Anginal pain ([place])    ??? Arthritis    ??? Constipation    ??? Hearing loss    ??? Hearing loss    ??? High cholesterol    ??? Hypertension    ??? Incontinence    ??? Joint pain    ??? Memory disorder    ??? Muscle pain    ??? Ringing in the ears    ??? Snoring    ??? Visual disturbance        Past Surgical History:  Past Surgical History:   Procedure Laterality Date   ??? HX HYSTERECTOMY     ??? HX ORTHOPAEDIC      epidural pain mtg   ??? HX OTHER SURGICAL Right 10/15/2016    ligament correction right foot   ??? HX OTHER SURGICAL  11/26/2016    Pituitary tumor removal       Family History:  Family History   Problem Relation Age of Onset   ??? Breast Cancer Mother 44   ???  Breast Cancer Sister 85   ??? Breast Cancer Daughter 51   ??? Dementia Brother    ??? Dementia Sister        Social History:  Social History     Tobacco Use   ??? Smoking status: Never Smoker   ??? Smokeless tobacco: Never Used   Substance Use Topics   ??? Alcohol use: No   ??? Drug use: No       Allergies:  Allergies   Allergen Reactions   ??? Pcn [Penicillins] Unknown (comments)   ??? Sulfa (Sulfonamide Antibiotics) Unknown (comments)         Review of Systems   Review of Systems   Constitutional: Negative.  Negative for chills and fever.   HENT: Negative.  Negative for congestion, facial swelling, rhinorrhea, sore throat, trouble swallowing and voice change.    Eyes: Negative.    Respiratory: Negative.  Negative for apnea, cough, chest tightness, shortness of breath and wheezing.     Cardiovascular: Negative.  Negative for chest pain, palpitations and leg swelling.   Gastrointestinal: Negative.  Negative for abdominal distention, abdominal pain, blood in stool, constipation, diarrhea, nausea and vomiting.   Endocrine: Negative.  Negative for cold intolerance, heat intolerance and polyuria.   Genitourinary: Positive for dysuria. Negative for difficulty urinating, flank pain, frequency, hematuria and urgency.   Musculoskeletal: Negative.  Negative for arthralgias, back pain, myalgias, neck pain and neck stiffness.   Skin: Negative.  Negative for color change and rash.   Neurological: Negative.  Negative for dizziness, syncope, facial asymmetry, speech difficulty, weakness, light-headedness, numbness and headaches.        (+) diffuse "burning sensation"   Hematological: Negative.  Does not bruise/bleed easily.   Psychiatric/Behavioral: Negative.  Negative for confusion and self-injury. The patient is not nervous/anxious.      Physical Exam   Physical Exam   Constitutional: She is oriented to person, place, and time. She appears well-developed and well-nourished. No distress.   HENT:   Head: Normocephalic and atraumatic.   Mouth/Throat: Oropharynx is clear and moist. No oropharyngeal exudate.   Eyes: Conjunctivae and EOM are normal. Pupils are equal, round, and reactive to light.   Neck: Normal range of motion.   Cardiovascular: Normal rate, regular rhythm and normal heart sounds. Exam reveals no gallop and no friction rub.   No murmur heard.  Pulmonary/Chest: Effort normal and breath sounds normal. No respiratory distress. She has no wheezes. She has no rales. She exhibits no tenderness.   Abdominal: Soft. Bowel sounds are normal. She exhibits no distension and no mass. There is no tenderness. There is no rebound and no guarding.   Musculoskeletal: Normal range of motion. She exhibits no edema, tenderness or deformity.   Neurological: She is alert and oriented to person, place, and time. She  displays normal reflexes. No cranial nerve deficit. She exhibits normal muscle tone. Coordination normal.   Skin: Skin is warm. No rash noted. She is not diaphoretic.   Psychiatric: She has a normal mood and affect.   Nursing note and vitals reviewed.    Diagnostic Study Results     Labs -  Recent Results (from the past 24 hour(s))   URINALYSIS W/ REFLEX CULTURE    Collection Time: 07/12/17  1:59 PM   Result Value Ref Range    Color YELLOW/STRAW      Appearance CLEAR CLEAR      Specific gravity 1.008 1.003 - 1.030      pH (UA) 5.5 5.0 -  8.0      Protein NEGATIVE  NEG mg/dL    Glucose NEGATIVE  NEG mg/dL    Ketone NEGATIVE  NEG mg/dL    Bilirubin NEGATIVE  NEG      Blood NEGATIVE  NEG      Urobilinogen 0.2 0.2 - 1.0 EU/dL    Nitrites NEGATIVE  NEG      Leukocyte Esterase SMALL (A) NEG      WBC 0-4 0 - 4 /hpf    RBC 0-5 0 - 5 /hpf    Epithelial cells FEW FEW /lpf    Bacteria NEGATIVE  NEG /hpf    UA:UC IF INDICATED CULTURE NOT INDICATED BY UA RESULT CNI      Hyaline cast 0-2 0 - 5 /lpf   POC CHEM8    Collection Time: 07/12/17  4:52 PM   Result Value Ref Range    Calcium, ionized (POC) 1.21 1.12 - 1.32 mmol/L    Sodium (POC) 140 136 - 145 mmol/L    Potassium (POC) 4.2 3.5 - 5.1 mmol/L    Chloride (POC) 103 98 - 107 mmol/L    CO2 (POC) 26 21 - 32 mmol/L    Anion gap (POC) 17 10 - 20 mmol/L    Glucose (POC) 92 65 - 100 mg/dL    BUN (POC) 13 9 - 20 mg/dL    Creatinine (POC) 0.7 0.6 - 1.3 mg/dL    GFRAA, POC >60 >60 ml/min/1.99m2    GFRNA, POC >60 >60 ml/min/1.57m2    Hematocrit (POC) 38 35.0 - 47.0 %    Comment Comment Not Indicated.       Medical Decision Making   I am the first provider for this patient.    I reviewed the vital signs, available nursing notes, past medical history, past surgical history, family history and social history.    Vital Signs-Reviewed the patient's vital signs.  Patient Vitals for the past 24 hrs:   Temp Pulse Resp BP SpO2   07/12/17 1708 ??? 72 16 168/79 ???    07/12/17 1351 97.8 ??F (36.6 ??C) 84 18 142/70 98 %       Pulse Oximetry Analysis - 100% on RA    Cardiac Monitor:   Rate: 72 bpm    Records Reviewed: Nursing Notes, Old Medical Records, Previous electrocardiograms, Previous Radiology Studies and Previous Laboratory Studies    Provider Notes (Medical Decision Making):   DDx: neuropathy, electrolyte disturbance, fibromyalgia    83 year old female with vague "burning" sensation. Non focal exam. Will treat symptoms. Will check lab and UA, likely recommend follow up with her neurologist.     ED Course:   Initial assessment performed. The patients presenting problems have been discussed, and they are in agreement with the care plan formulated and outlined with them.  I have encouraged them to ask questions as they arise throughout their visit.    HYPERTENSION COUNSELING Education was provided to the patient today regarding their hypertension. Patient is made aware of their elevated blood pressure and is instructed to follow up this week with their Primary Care for a recheck. Patient is counseled regarding consequences of chronic, uncontrolled hypertension including kidney disease, heart disease, stroke or even death. Patient states their understanding and agrees to follow up this week. Additionally, during their visit, I discussed sodium restriction, maintaining ideal body weight and regular exercise program as physiologic means to achieve blood pressure control. The patient will strive towards this.     I reviewed  our electronic medical record system for any past medical records that were available that may contribute to the patient's current condition, the nursing notes and vital signs from today's visit.  Roena Malady, MD  Medications Administered During ED Course::  Medications   traMADol (ULTRAM) tablet 50 mg (50 mg Oral Given 07/12/17 1601)   oxyCODONE IR (ROXICODONE) tablet 5 mg (5 mg Oral Given 07/12/17 1635)       Progress Note:  ED Course as of Jul 12 1904    Fri Jul 12, 2017   1623 RN unable to get IV access. Pt requesting something stronger than Tramadol, stating she has that at home.   [MI]      ED Course User Index  [MI] Starr Lake       Progress Note:  4:20 PM  Patient has been reassessed and reports feeling better and symptoms have improved after ED treatment. Additionally, pt is able to tolerate PO and ambulate per baseline. Mattisyn L Turney's final labs and imaging have been reviewed with her. She has been counseled regarding her diagnosis.  She verbally conveys understanding and agreement of the signs, symptoms, diagnosis, treatment and prognosis and additionally agrees to follow up as recommended with Dr. Cira Servant, Clarita Leber, MD in 24 - 48 hours. She also agrees with the care-plan and conveys that all of her questions have been answered.  I have also put together some discharge instructions for her that include: 1) educational information regarding their diagnosis, 2) how to care for their diagnosis at home, as well a 3) list of reasons why they would want to return to the ED prior to their follow-up appointment, should their condition change.     I have answered all questions to patient's satisfaction. She both understood and agreed with plan as discussed above. Vital signs stable for discharge.     Critical Care Time:   none    Disposition:  DISCHARGE NOTE  4:40 PM  The patient has been re-evaluated and is ready for discharge. Reviewed available results with patient. Counseled pt on diagnosis and care plan. Pt has expressed understanding, and all questions have been answered. Pt agrees with plan and agrees to follow up as recommended, or return to the ED if their symptoms worsen. Discharge instructions have been provided and explained to the pt, along with reasons to return to the ED.    PLAN:  1.   Discharge Medication List as of 07/12/2017  4:33 PM      CONTINUE these medications which have NOT CHANGED    Details    verapamil ER (CALAN-SR) 180 mg CR tablet TAKE 1 TABLET BY MOUTH EVERY NIGHT, Normal, Disp-90 Tab, R-0      !! alendronate (FOSAMAX) 70 mg tablet TAKE 1 TABLET BY MOUTH EVERY 7 DAYS, Normal, Disp-4 Tab, R-3      nortriptyline (PAMELOR) 10 mg capsule TAKE 1 CAPSULE BY MOUTH EVERY NIGHT, Normal, Disp-90 Cap, R-0      cilostazol (PLETAL) 100 mg tablet TAKE 1 TABLET BY MOUTH TWICE DAILY ON EMPTY STOMACH, Normal, Disp-180 Tab, R-0      nitrofurantoin, macrocrystal-monohydrate, (MACROBID) 100 mg capsule Take 1 Cap by mouth two (2) times a day., Normal, Disp-14 Cap, R-0      cefUROXime (CEFTIN) 250 mg tablet Take 250 mg by mouth two (2) times a day., Historical Med      famotidine (PEPCID) 20 mg tablet Take 1 Tab by mouth two (2) times a day., Normal, Disp-180 Tab, R-1  ezetimibe (ZETIA) 10 mg tablet Take 1 Tab by mouth daily., Normal, Disp-90 Tab, R-1      hydrocortisone (CORTEF) 10 mg tablet TK 1 T PO Q 12 H, Historical Med, R-5      isosorbide dinitrate (ISORDIL) 10 mg tablet TAKE 1 TABLET BY MOUTH TWICE DAILY, Normal, Disp-180 Tab, R-0      lisinopril (PRINIVIL) 10 mg tablet Take 1 Tab by mouth daily., Normal, Disp-30 Tab, R-0      cholecalciferol (VITAMIN D3) 1,000 unit cap Take  by mouth daily., Historical Med      acetaminophen (TYLENOL) 325 mg tablet Take 1 Tab by mouth three (3) times daily., Normal, Disp-180 Tab, R-3      gabapentin (NEURONTIN) 300 mg capsule TAKE 1 CAPSULE BY MOUTH THREE TIMES DAILY, Normal**Patient requests 90 days supply**Disp-270 Cap, R-1      Walker misc 1 Units by Does Not Apply route daily., Normal, Disp-1 Each, R-0      oxybutynin (DITROPAN) 5 mg tablet TAKE 1 TABLET BY MOUTH TWICE DAILY, Normal**Patient requests 90 days supply**Disp-180 Tab, R-3      simvastatin (ZOCOR) 5 mg tablet Take  by mouth nightly., Historical Med      polyethylene glycol (MIRALAX) 17 gram/dose powder TAKE 1 CAPFUL PO ONCE A DAY, Historical Med, R-11       !! alendronate (FOSAMAX) 70 mg tablet TAKE 1 TABLET BY MOUTH EVERY 7 DAYS, Normal**Patient requests 90 days supply**Disp-12 Tab, R-6      aspirin 81 mg chewable tablet Take 81 mg by mouth daily., Historical Med      coenzyme q10-vitamin e (COQ10 SG 100) 100-100 mg-unit cap Take  by mouth., Historical Med      omeprazole (PRILOSEC) 40 mg capsule Take 1 Cap by mouth daily., Print, Disp-30 Cap, R-0      VITAMIN E MIXED (NATURAL VITAMIN E PO) Take 400 Units by mouth. 3 x week , Historical Med      vitamin A 8,000 unit capsule Take 8,000 Units by mouth daily., Historical Med       !! - Potential duplicate medications found. Please discuss with provider.        2.   Follow-up Information     Follow up With Specialties Details Why Contact Info    Darlys Gales, MD Internal Medicine   [address]  [place] [address]  [phone]      [facility] EMERGENCY DEPT Emergency Medicine  As needed, If symptoms worsen [address]  [place]  [phone]          Return to ED if worse     Diagnosis     Clinical Impression:   1. Neuropathy    2. Burning sensation    3. Accelerated hypertension    4. Urinary urgency        Attestations  This note is prepared by Starr Lake, acting as Scribe for Fredderick Erb, MD  ??  Fredderick Erb, MD : The scribe's documentation has been prepared under my direction and personally reviewed by me in its entirety. I confirm that the note above accurately reflects all work, treatment, procedures, and medical decision making performed by me.    :  This note will not be viewable in [place].                  Marland Kitchen

## 2017-07-12 NOTE — Telephone Encounter (Signed)
Pt called stating she was burning from the top of her head to the bottom of her feet. Explained to pt that Dr. Cira Servant could not do any type of scans or xrays on her in office and she would need to go to the nearest ED. Pt voiced an understanding. [name], LPN

## 2017-07-12 NOTE — ED Notes (Signed)
Writer confirms triage note with pt and pt's daughter

## 2017-07-15 MED ORDER — CILOSTAZOL 100 MG TAB
100 mg | ORAL_TABLET | ORAL | 0 refills | Status: DC
Start: 2017-07-15 — End: 2017-10-18

## 2017-07-19 MED ORDER — NORTRIPTYLINE 10 MG CAP
10 mg | ORAL_CAPSULE | ORAL | 0 refills | Status: DC
Start: 2017-07-19 — End: 2017-10-18

## 2017-07-22 ENCOUNTER — Ambulatory Visit: Admit: 2017-07-22 | Discharge: 2017-07-22 | Payer: MEDICARE | Attending: Internal Medicine | Primary: Family Medicine

## 2017-07-22 DIAGNOSIS — I10 Essential (primary) hypertension: Secondary | ICD-10-CM

## 2017-07-22 MED ORDER — GABAPENTIN 300 MG CAP
300 mg | ORAL_CAPSULE | ORAL | 1 refills | Status: DC
Start: 2017-07-22 — End: 2017-08-21

## 2017-07-22 NOTE — Progress Notes (Signed)
Chief Complaint   Patient presents with   ??? Hospital Follow Up     patient went to ED o n12/7 for all over burning sensation.      1. Have you been to the ER, urgent care clinic since your last visit?  Hospitalized since your last visit?Yes When: 07/12/17 Where: [facility] Reason for visit: All  over burning sensation.     2. Have you seen or consulted any other health care providers outside of the [facility] since your last visit?  Include any pap smears or colon screening. No

## 2017-07-22 NOTE — Progress Notes (Signed)
Subjective:      Kimberly [name] is a 83 y.o. female who presents today for   Chief Complaint   Patient presents with   ??? Hospital Follow Up     patient went to ED o n12/7 for all over burning sensation.      Patient in today complaining of burning pain all over her body. Mainly in her feet. She has neurontin but only taking once daily.   She has not been taking it as directed.    She was seen at ER on 12/7 and given oxycodone. Patient says it helped her pain. She recently saw the dentist for another issue who told her to take tylenol and stop taking oxycodone because it was too strong for her    Patient is having dental work and dentist has asked her to discontinue her fosamax temporarily        Patient Active Problem List    Diagnosis Date Noted   ??? Syncope and collapse 07/18/2016   ??? Headache disorder 07/18/2016   ??? 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 Memory loss 11/21/2014   ??? Acute bacterial conjunctivitis of right eye 11/21/2014   ??? SOB (shortness of breath) on exertion 06/21/2014   ??? Chest pain with normal angiography--~ 15 yrs ago [facility] 06/21/2014   ??? Hypertension, essential, benign    ??? High cholesterol      Current Outpatient Medications   Medication Sig Dispense Refill   ??? estradiol (VAGIFEM) 10 mcg tab vaginal tablet INSERT 1 T VAGINALLY TWICE PER WEEK  3   ??? nortriptyline (PAMELOR) 10 mg capsule TAKE 1 CAPSULE BY MOUTH EVERY NIGHT 90 Cap 0   ??? cilostazol (PLETAL) 100 mg tablet TAKE 1 TABLET BY MOUTH TWICE DAILY ON EMPTY STOMACH 180 Tab 0   ??? verapamil ER (CALAN-SR) 180 mg CR tablet TAKE 1 TABLET BY MOUTH EVERY NIGHT 90 Tab 0   ??? nitrofurantoin, macrocrystal-monohydrate, (MACROBID) 100 mg capsule Take 1 Cap by mouth two (2) times a day. 14 Cap 0   ??? famotidine (PEPCID) 20 mg tablet Take 1 Tab by mouth two (2) times a day. 180 Tab 1   ??? ezetimibe (ZETIA) 10 mg tablet Take 1 Tab by mouth daily. 90 Tab 1   ??? hydrocortisone (CORTEF) 10 mg tablet TK 1 T PO Q 12 H  5    ??? isosorbide dinitrate (ISORDIL) 10 mg tablet TAKE 1 TABLET BY MOUTH TWICE DAILY 180 Tab 0   ??? lisinopril (PRINIVIL) 10 mg tablet Take 1 Tab by mouth daily. 30 Tab 0   ??? cholecalciferol (VITAMIN D3) 1,000 unit cap Take  by mouth daily.     ??? acetaminophen (TYLENOL) 325 mg tablet Take 1 Tab by mouth three (3) times daily. 180 Tab 3   ??? gabapentin (NEURONTIN) 300 mg capsule TAKE 1 CAPSULE BY MOUTH THREE TIMES DAILY 270 Cap 1   ??? Walker misc 1 Units by Does Not Apply route daily. 1 Each 0   ??? oxybutynin (DITROPAN) 5 mg tablet TAKE 1 TABLET BY MOUTH TWICE DAILY 180 Tab 3   ??? simvastatin (ZOCOR) 5 mg tablet Take  by mouth nightly.     ??? polyethylene glycol (MIRALAX) 17 gram/dose powder TAKE 1 CAPFUL PO ONCE A DAY  11   ??? aspirin 81 mg chewable tablet Take 81 mg by mouth daily.     ??? coenzyme q10-vitamin e (COQ10 SG 100) 100-100 mg-unit cap Take  by mouth.     ???  omeprazole (PRILOSEC) 40 mg capsule Take 1 Cap by mouth daily. 30 Cap 0   ??? VITAMIN E MIXED (NATURAL VITAMIN E PO) Take 400 Units by mouth. 3 x week      ??? vitamin A 8,000 unit capsule Take 8,000 Units by mouth daily.     ??? oxyCODONE IR (ROXICODONE) 5 mg immediate release tablet Take 1 Tab by mouth every four (4) hours as needed for Pain. Max Daily Amount: 30 mg. 15 Tab 0   ??? alendronate (FOSAMAX) 70 mg tablet TAKE 1 TABLET BY MOUTH EVERY 7 DAYS 4 Tab 3   ??? cefUROXime (CEFTIN) 250 mg tablet Take 250 mg by mouth two (2) times a day.     ??? alendronate (FOSAMAX) 70 mg tablet TAKE 1 TABLET BY MOUTH EVERY 7 DAYS 12 Tab 6     Allergies   Allergen Reactions   ??? Pcn [Penicillins] Unknown (comments)   ??? Sulfa (Sulfonamide Antibiotics) Unknown (comments)     Past Medical History:   Diagnosis Date   ??? Anginal pain ([place])    ??? Arthritis    ??? Constipation    ??? Hearing loss    ??? Hearing loss    ??? High cholesterol    ??? Hypertension    ??? Incontinence    ??? Joint pain    ??? Memory disorder    ??? Muscle pain    ??? Ringing in the ears    ??? Snoring    ??? Visual disturbance       Past Surgical History:   Procedure Laterality Date   ??? HX HYSTERECTOMY     ??? HX ORTHOPAEDIC      epidural pain mtg   ??? HX OTHER SURGICAL Right 10/15/2016    ligament correction right foot   ??? HX OTHER SURGICAL  11/26/2016    Pituitary tumor removal     Family History   Problem Relation Age of Onset   ??? Breast Cancer Mother 68   ??? Breast Cancer Sister 63   ??? Breast Cancer Daughter 60   ??? Dementia Brother    ??? Dementia Sister      Social History     Tobacco Use   ??? Smoking status: Never Smoker   ??? Smokeless tobacco: Never Used   Substance Use Topics   ??? Alcohol use: No        Review of Systems    A comprehensive review of systems was negative except for that written in the HPI.     Objective:     Visit Vitals  BP 155/78   Pulse 89   Temp 98.2 ??F (36.8 ??C) (Oral)   Resp 16   Ht 5\' 3"  (1.6 m)   Wt 127 lb (57.6 kg)   BMI 22.50 kg/m??     General:  Alert, cooperative, no distress, appears stated age.   Head:  Normocephalic, without obvious abnormality, atraumatic.   Eyes:  Conjunctivae/corneas clear. PERRL, EOMs intact. Fundi benign.   Ears:  Normal TMs and external ear canals both ears.   Nose: Nares normal. Septum midline. Mucosa normal. No drainage or sinus tenderness.   Throat: Lips, mucosa, and tongue normal. Teeth and gums normal.   Neck: Supple, symmetrical, trachea midline, no adenopathy, thyroid: no enlargement/tenderness/nodules, no carotid bruit and no JVD.   Back:   Symmetric, no curvature. ROM normal. No CVA tenderness.   Lungs:   Clear to auscultation bilaterally.   Chest wall:  No tenderness or deformity.  Heart:  Regular rate and rhythm, S1, S2 normal, no murmur, click, rub or gallop.   Abdomen:   Soft, non-tender. Bowel sounds normal. No masses,  No organomegaly.   Extremities: Extremities normal, atraumatic, no cyanosis or edema.   Pulses: 2+ and symmetric all extremities.   Skin: Skin color, texture, turgor normal. No rashes or lesions.    Lymph nodes: Cervical, supraclavicular, and axillary nodes normal.   Neurologic: CNII-XII intact. Normal strength, sensation and reflexes throughout.       Assessment/Plan:       ICD-10-CM ICD-9-CM    1. Essential hypertension I10 401.9 Continue antihypertensive therapy   2. Tooth decay K02.9 521.00    3. Osteoporosis, unspecified osteoporosis type, unspecified pathological fracture presence M81.0 733.00 Will discontinue Fosamax per request of her dentist during dental work   4. Neuropathy G62.9 355.9 Instructed to take tylenol q 8 hours    Will instruct to start taking gabapentin twice daily and will provide refill today       Follow-up Disposition: Not on File   Advised her to call back or return to office if symptoms worsen/change/persist.  Discussed expected course/resolution/complications of diagnosis in detail with patient.    Medication risks/benefits/costs/interactions/alternatives discussed with patient.  She was given an after visit summary which includes diagnoses, current medications, & vitals.  She expressed understanding with the diagnosis and plan.

## 2017-08-05 IMAGING — CR DG CHEST 2V
2 series · 2 of 2 positions shown · non-contrast
Comparison: None.

CLINICAL DATA: Patient with syncopal episode.

EXAM:
CHEST  2 VIEW

[chest ap]
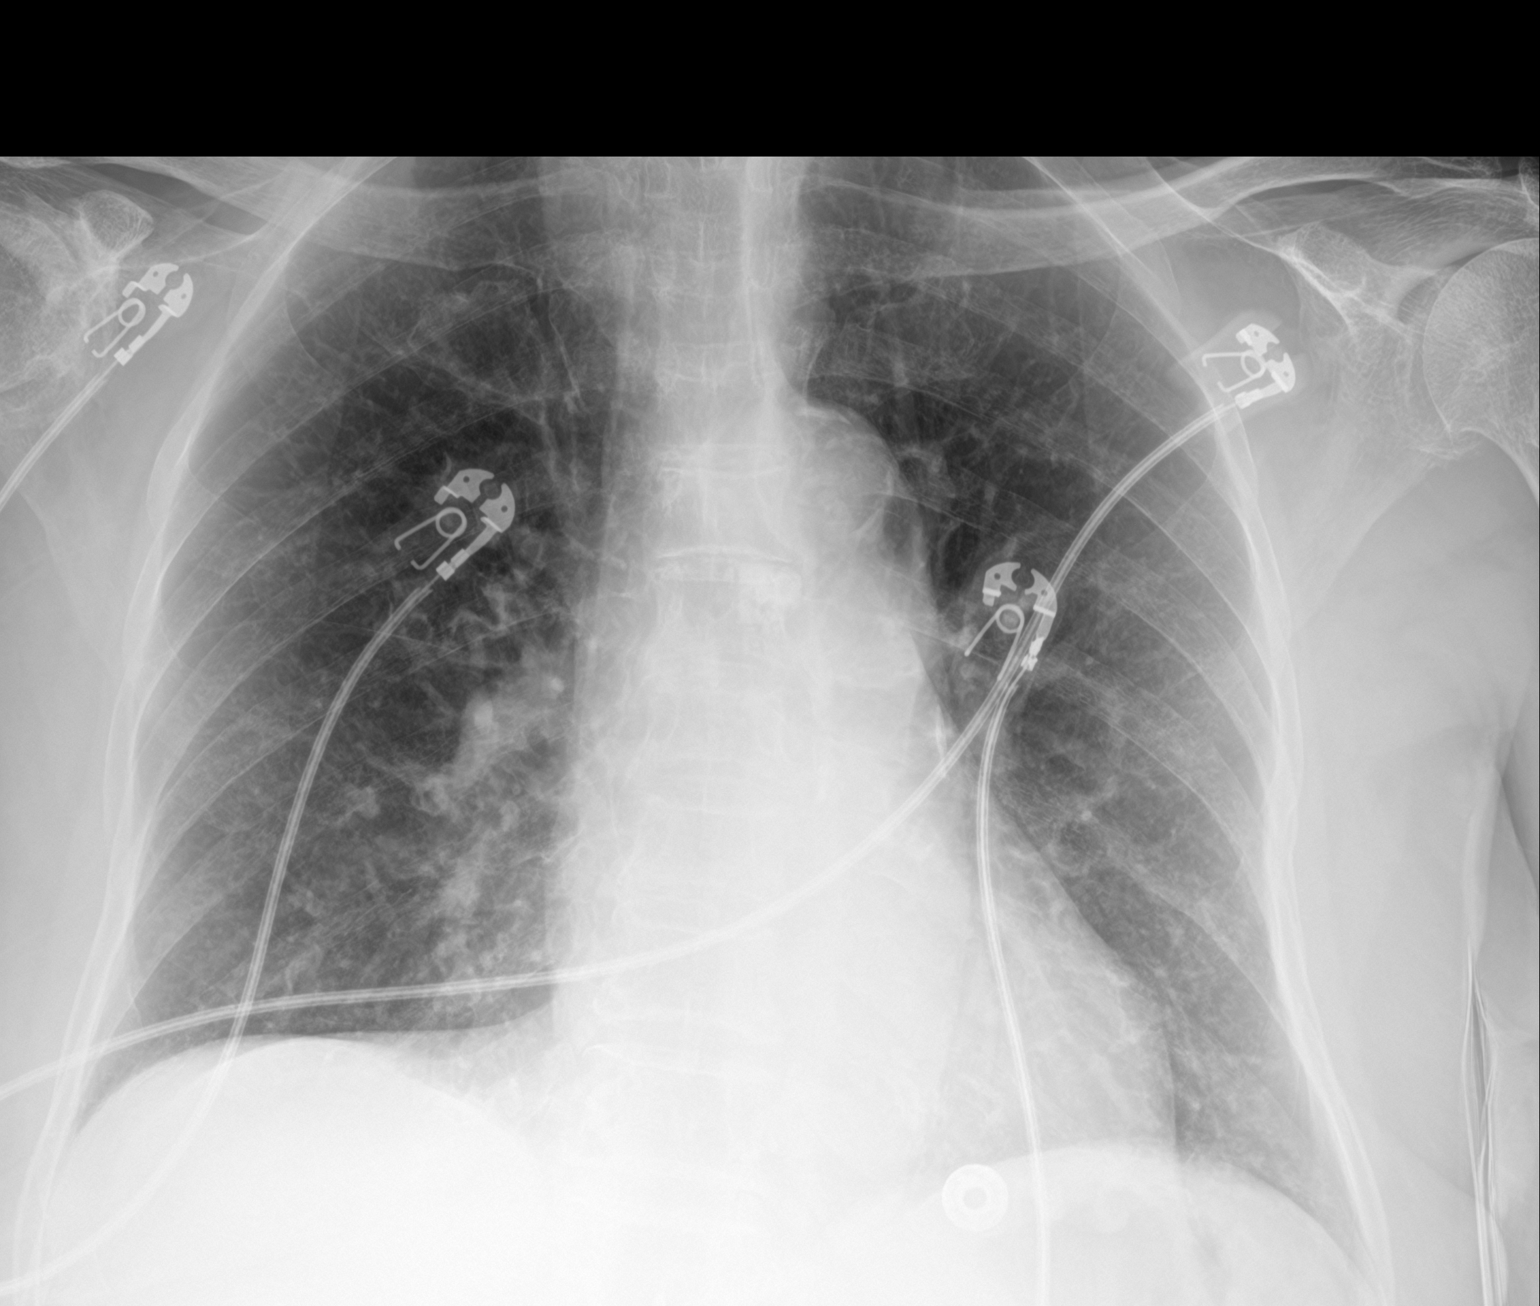

[chest lat]
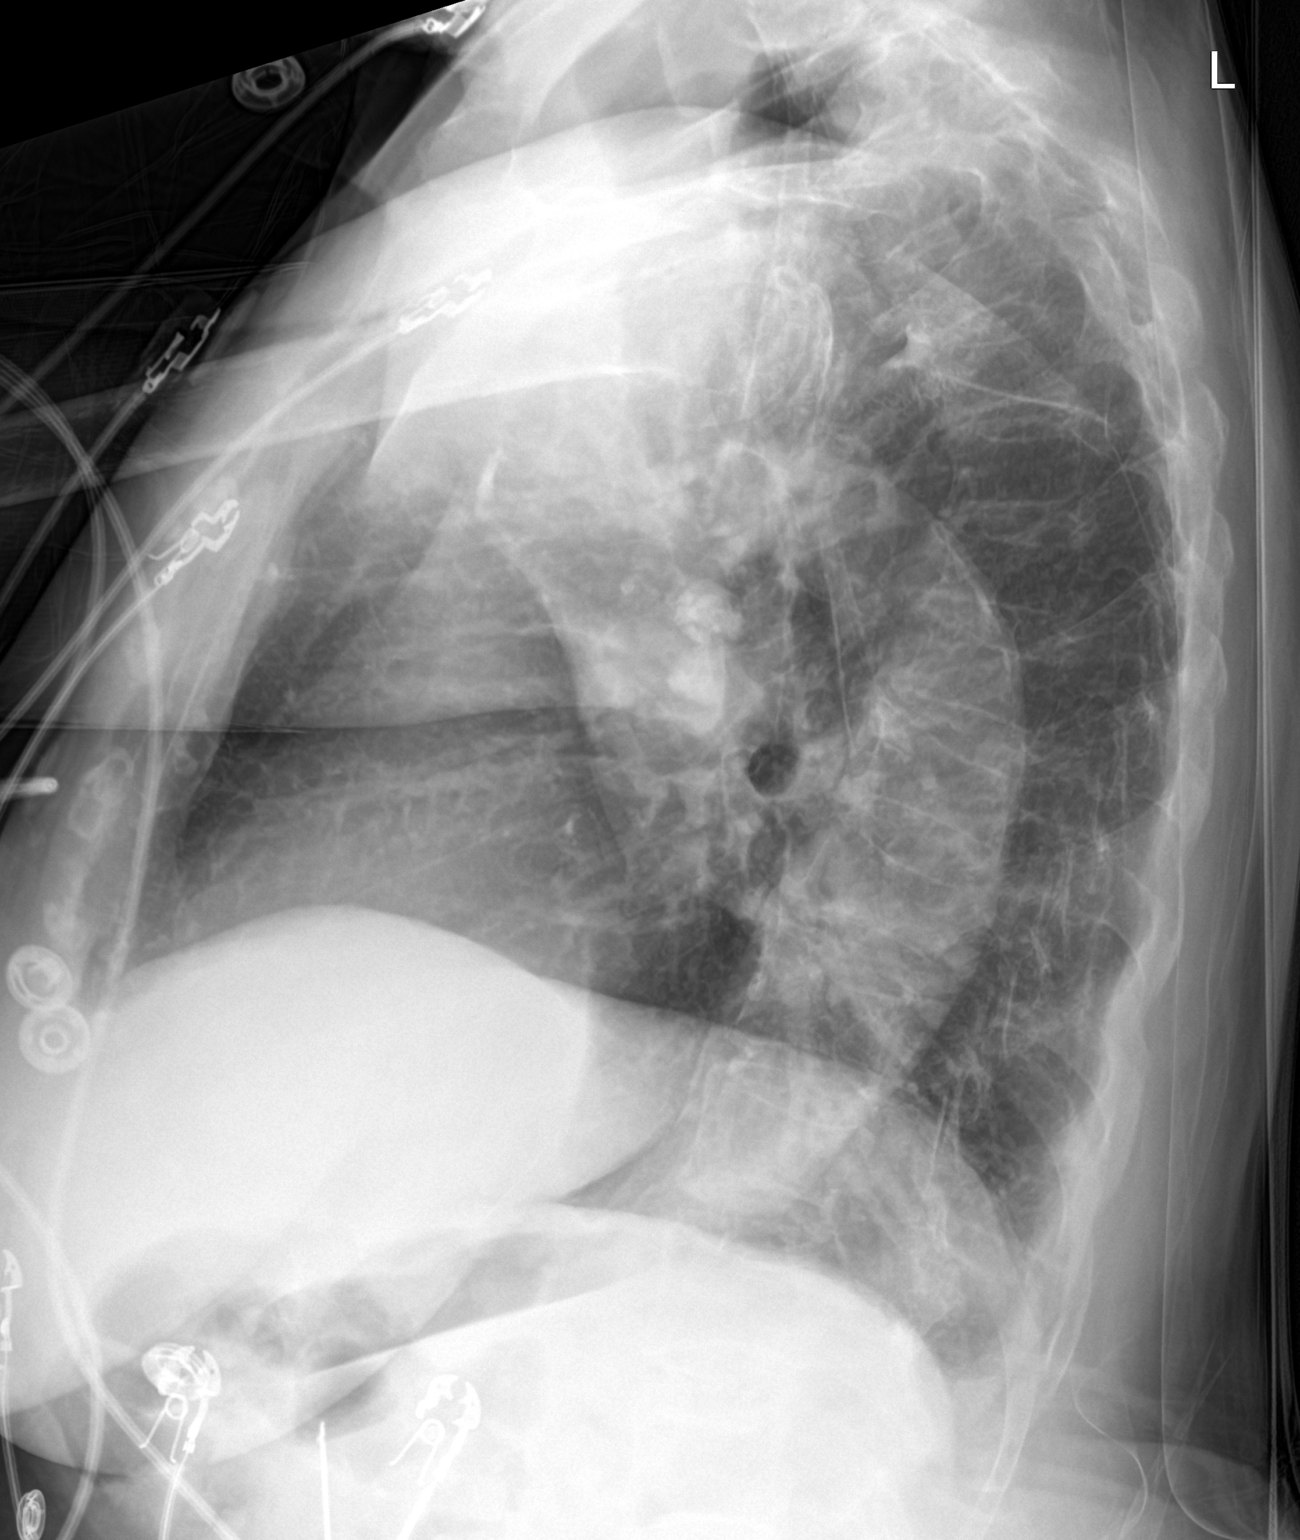

[2 of 2 positions shown; findings below may reference images not displayed]

FINDINGS: Normal cardiac and mediastinal contours. Elevation the right
hemidiaphragm. No large area of pulmonary consolidation. Biapical
pleural parenchymal thickening and emphysematous change. Thoracic
spine degenerative changes. Tortuosity of the thoracic aorta.
IMPRESSION: No acute cardiopulmonary process.

## 2017-08-05 IMAGING — CT CT HEAD W/O CM
2 series · 15 of 30 positions shown, 17 images · non-contrast
Comparison: None.

CLINICAL DATA: Near syncopal episodes

EXAM:
CT HEAD WITHOUT CONTRAST
TECHNIQUE: Contiguous axial images were obtained from the base of the skull
through the vertex without intravenous contrast.

[Series 2: head without · axial · non-contrast · 0.39mm/px · z∈[-107,-2]mm · 7 of 29 slices shown, 9 images]
[im 4/29  brain]
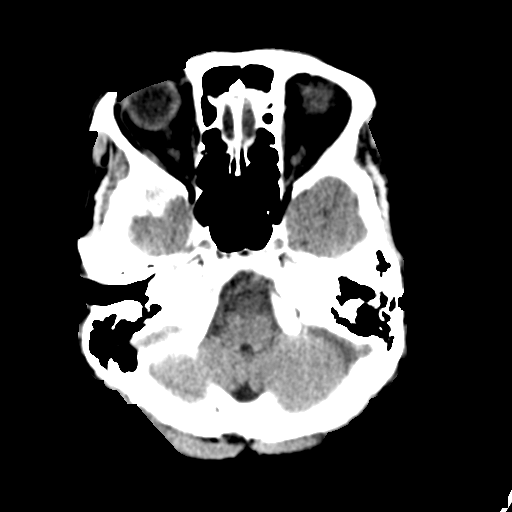
[im 4/29  bone]
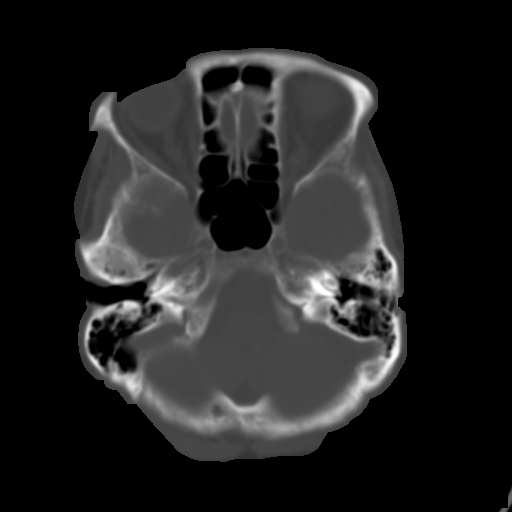
[im 8/29  brain]
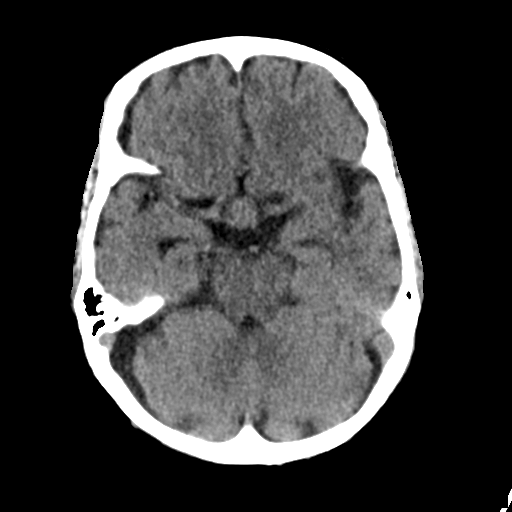
[im 11/29  brain]
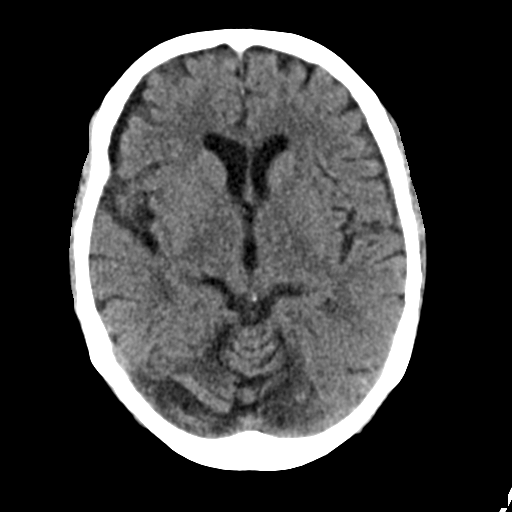
[im 15/29  brain]
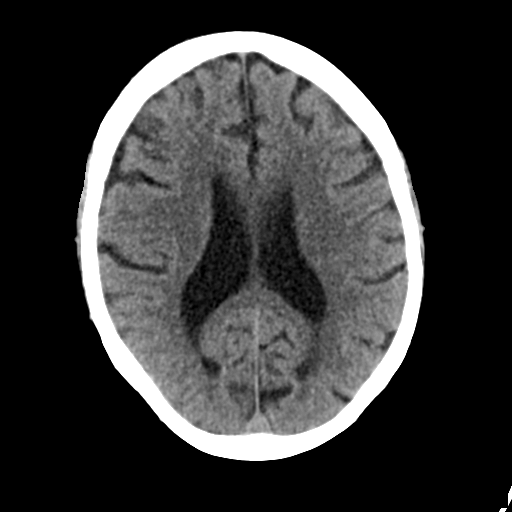
[im 18/29  brain]
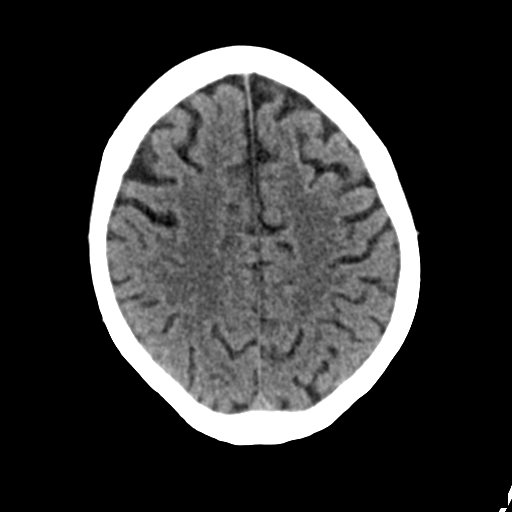
[im 18/29  bone]
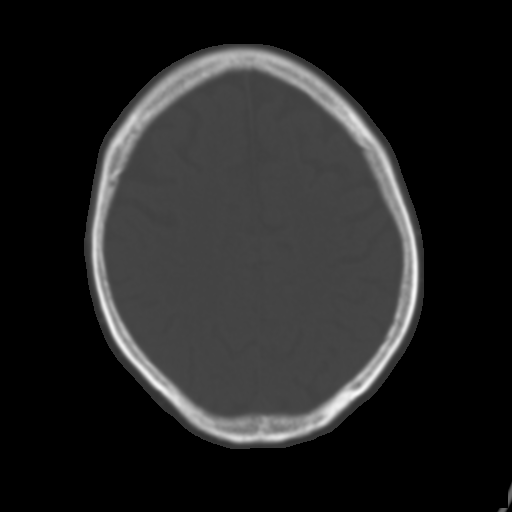
[im 22/29  brain]
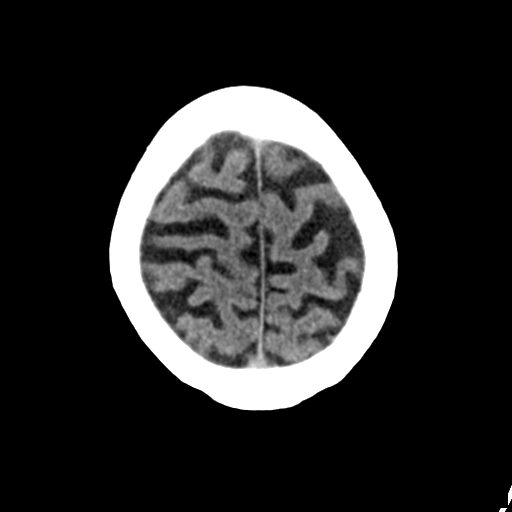
[im 25/29  brain]
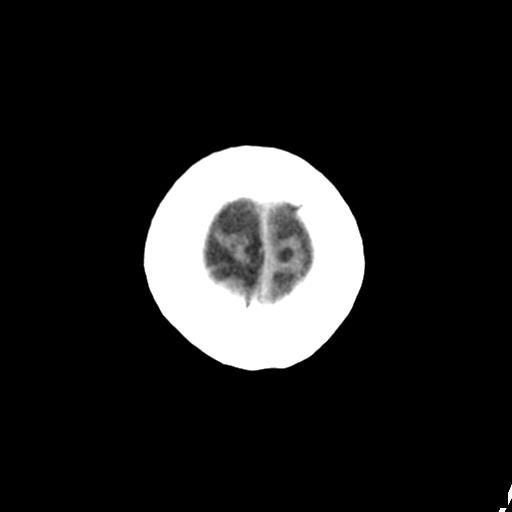

[Series 3: head bone · axial · 0.39mm/px · z∈[-108,+4]mm · 8 of 71 slices shown]
[im 8/71  bone]
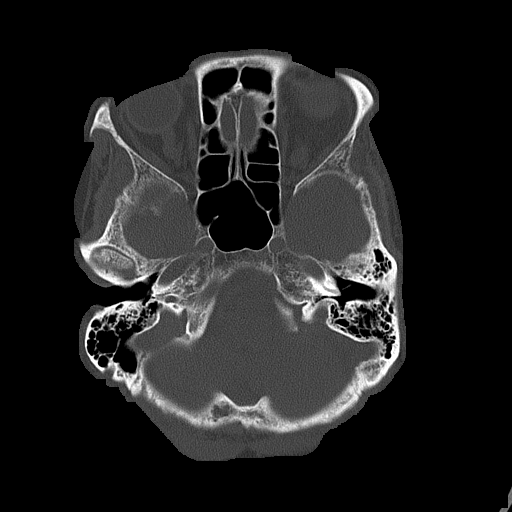
[im 15/71  bone]
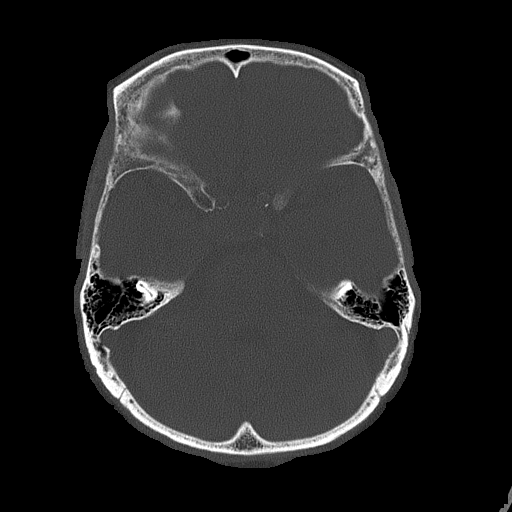
[im 22/71  bone]
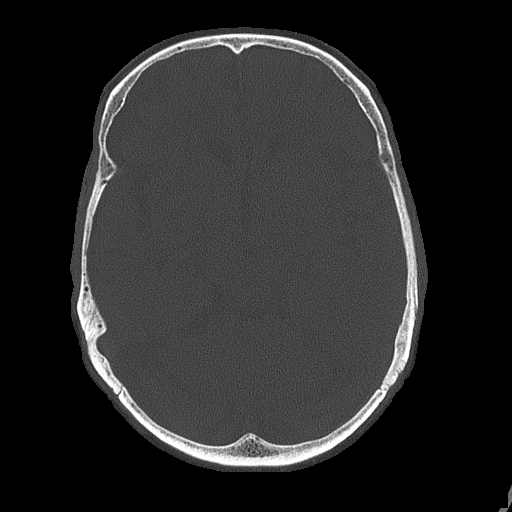
[im 32/71  bone]
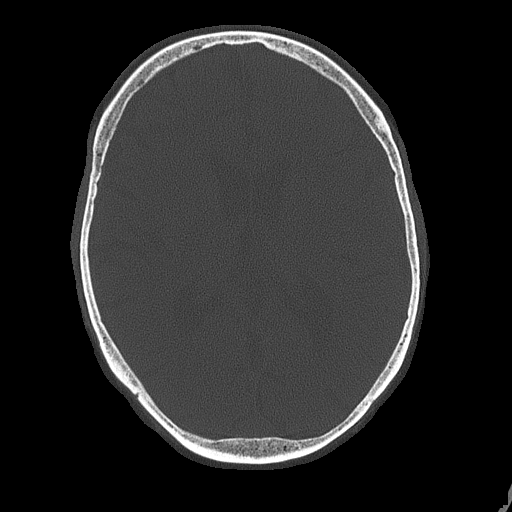
[im 39/71  bone]
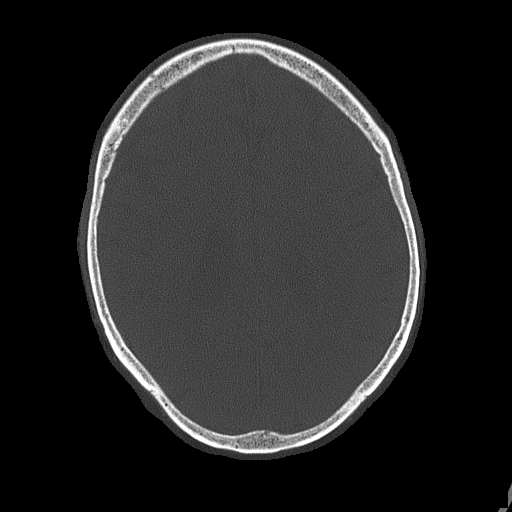
[im 50/71  bone]
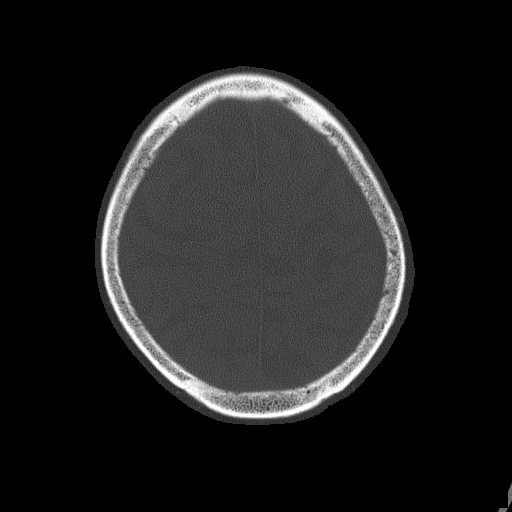
[im 57/71  bone]
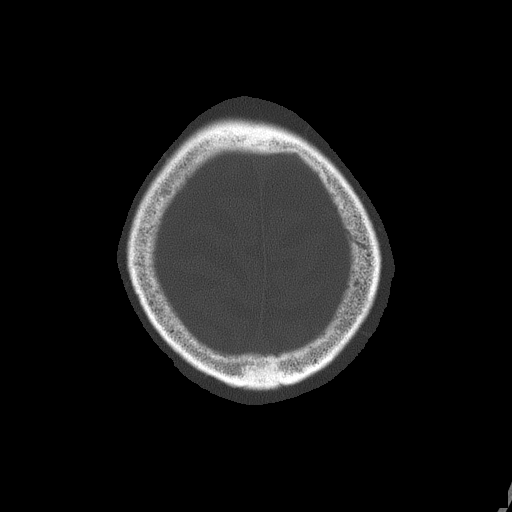
[im 64/71  bone]
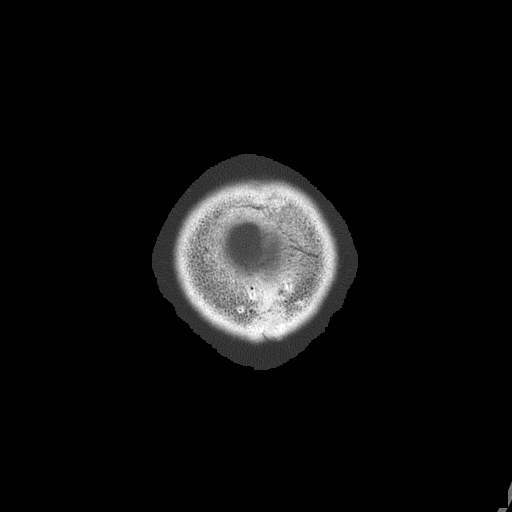

[15 of 30 positions shown; findings below may reference images not displayed]

FINDINGS: There is mild diffuse atrophy. Atrophy is slightly more notable in
the right frontal region than elsewhere. There is a mass arising in
the sella measuring 1.9 x 1.7 cm. No other mass is evident. There is
no hemorrhage, extra-axial fluid collection, or midline shift. There
is slight small vessel disease in the centra semiovale bilaterally.
Elsewhere gray-white compartments appear normal. No acute infarct
evident. Bony calvarium appears intact. Visualized mastoid air cells
are clear. Visualized orbits appear symmetric bilaterally.
IMPRESSION: Mass arising from the sellar region as described. Suspect pituitary
tumor, although aneurysm or meningioma conceivably could present in
this manner. Given this finding, MRI of the brain and sella pre and
post-contrast is warranted.

Elsewhere, there is atrophy with mild periventricular small vessel
disease. No acute infarct evident. No hemorrhage or midline shift.

## 2017-08-21 MED ORDER — GABAPENTIN 300 MG CAP
300 mg | ORAL_CAPSULE | ORAL | 3 refills | Status: DC
Start: 2017-08-21 — End: 2017-09-04

## 2017-08-21 MED ORDER — GABAPENTIN 100 MG CAP
100 mg | ORAL_CAPSULE | ORAL | 0 refills | Status: DC
Start: 2017-08-21 — End: 2017-09-04

## 2017-08-21 NOTE — Telephone Encounter (Signed)
Last seen 12/14/16  no follow up appointment. Patient was no show 01/25/17

## 2017-08-22 MED ORDER — OMEPRAZOLE 40 MG CAP, DELAYED RELEASE
40 mg | ORAL_CAPSULE | Freq: Every day | ORAL | 3 refills | Status: DC
Start: 2017-08-22 — End: 2017-08-22

## 2017-08-23 MED ORDER — OMEPRAZOLE 40 MG CAP, DELAYED RELEASE
40 mg | ORAL_CAPSULE | ORAL | 3 refills | Status: DC
Start: 2017-08-23 — End: 2018-11-14

## 2017-09-04 ENCOUNTER — Ambulatory Visit: Admit: 2017-09-04 | Discharge: 2017-09-04 | Payer: MEDICARE | Attending: Internal Medicine | Primary: Family Medicine

## 2017-09-04 DIAGNOSIS — M792 Neuralgia and neuritis, unspecified: Secondary | ICD-10-CM

## 2017-09-04 MED ORDER — PREGABALIN 50 MG CAP
50 mg | ORAL_CAPSULE | Freq: Two times a day (BID) | ORAL | 3 refills | Status: DC
Start: 2017-09-04 — End: 2018-04-23

## 2017-09-04 NOTE — Progress Notes (Signed)
Subjective:      Kimberly [name] is a 84 y.o. female who presents today for No chief complaint on file.    taking tylenol 3 times daily and gabapentin 3 times daily  She says the symptoms have become worse  Legs and feet are burning, also reports arms are burning  She has been evaluated by vascular in the past    C/O right arm pain    Hypertension- reports compliance with medication    She is accompanied today by her niece and caregiver      Patient Active Problem List    Diagnosis Date Noted   ??? Syncope and collapse 07/18/2016   ??? Headache disorder 07/18/2016   ??? 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 Memory loss 11/21/2014   ??? Acute bacterial conjunctivitis of right eye 11/21/2014   ??? SOB (shortness of breath) on exertion 06/21/2014   ??? Chest pain with normal angiography--~ 15 yrs ago MCV 06/21/2014   ??? Hypertension, essential, benign    ??? High cholesterol      Current Outpatient Medications   Medication Sig Dispense Refill   ??? gabapentin (NEURONTIN) 100 mg capsule TAKE 1 CAPSULE BY MOUTH THREE TIMES DAILY 270 Cap 0   ??? cilostazol (PLETAL) 100 mg tablet TAKE 1 TABLET BY MOUTH TWICE DAILY ON EMPTY STOMACH 180 Tab 0   ??? famotidine (PEPCID) 20 mg tablet Take 1 Tab by mouth two (2) times a day. 180 Tab 1   ??? ezetimibe (ZETIA) 10 mg tablet Take 1 Tab by mouth daily. 90 Tab 1   ??? acetaminophen (TYLENOL) 325 mg tablet Take 1 Tab by mouth three (3) times daily. 180 Tab 3   ??? omeprazole (PRILOSEC) 40 mg capsule TAKE 1 CAPSULE BY MOUTH DAILY 90 Cap 3   ??? gabapentin (NEURONTIN) 300 mg capsule TAKE 1 CAPSULE BY MOUTH THREE TIMES DAILY 90 Cap 3   ??? estradiol (VAGIFEM) 10 mcg tab vaginal tablet INSERT 1 T VAGINALLY TWICE PER WEEK  3   ??? nortriptyline (PAMELOR) 10 mg capsule TAKE 1 CAPSULE BY MOUTH EVERY NIGHT 90 Cap 0   ??? verapamil ER (CALAN-SR) 180 mg CR tablet TAKE 1 TABLET BY MOUTH EVERY NIGHT 90 Tab 0   ??? hydrocortisone (CORTEF) 10 mg tablet TK 1 T PO Q 12 H  5    ??? isosorbide dinitrate (ISORDIL) 10 mg tablet TAKE 1 TABLET BY MOUTH TWICE DAILY 180 Tab 0   ??? lisinopril (PRINIVIL) 10 mg tablet Take 1 Tab by mouth daily. 30 Tab 0   ??? cholecalciferol (VITAMIN D3) 1,000 unit cap Take  by mouth daily.     ??? Walker misc 1 Units by Does Not Apply route daily. 1 Each 0   ??? oxybutynin (DITROPAN) 5 mg tablet TAKE 1 TABLET BY MOUTH TWICE DAILY 180 Tab 3   ??? simvastatin (ZOCOR) 5 mg tablet Take  by mouth nightly.     ??? polyethylene glycol (MIRALAX) 17 gram/dose powder TAKE 1 CAPFUL PO ONCE A DAY  11   ??? aspirin 81 mg chewable tablet Take 81 mg by mouth daily.     ??? coenzyme q10-vitamin e (COQ10 SG 100) 100-100 mg-unit cap Take  by mouth.     ??? VITAMIN E MIXED (NATURAL VITAMIN E PO) Take 400 Units by mouth. 3 x week      ??? vitamin A 8,000 unit capsule Take 8,000 Units by mouth daily.       Allergies   Allergen Reactions   ???  Pcn [Penicillins] Unknown (comments)   ??? Sulfa (Sulfonamide Antibiotics) Unknown (comments)     Past Medical History:   Diagnosis Date   ??? Anginal pain ([place])    ??? Arthritis    ??? Constipation    ??? Hearing loss    ??? Hearing loss    ??? High cholesterol    ??? Hypertension    ??? Incontinence    ??? Joint pain    ??? Memory disorder    ??? Muscle pain    ??? Ringing in the ears    ??? Snoring    ??? Visual disturbance      Past Surgical History:   Procedure Laterality Date   ??? HX HYSTERECTOMY     ??? HX ORTHOPAEDIC      epidural pain mtg   ??? HX OTHER SURGICAL Right 10/15/2016    ligament correction right foot   ??? HX OTHER SURGICAL  11/26/2016    Pituitary tumor removal     Family History   Problem Relation Age of Onset   ??? Breast Cancer Mother 72   ??? Breast Cancer Sister 64   ??? Breast Cancer Daughter 85   ??? Dementia Brother    ??? Dementia Sister      Social History     Tobacco Use   ??? Smoking status: Never Smoker   ??? Smokeless tobacco: Never Used   Substance Use Topics   ??? Alcohol use: No        Review of Systems    A comprehensive review of systems was negative except for that written in  the HPI.     Objective:     Visit Vitals  BP (!) 179/96   Pulse 89   Temp 98 ??F (36.7 ??C) (Oral)   Resp 16   Ht 5\' 3"  (1.6 m)   Wt 126 lb (57.2 kg)   BMI 22.32 kg/m??     General:  Alert, cooperative, no distress, appears stated age.   Head:  Normocephalic, without obvious abnormality, atraumatic.   Eyes:  Conjunctivae/corneas clear. PERRL, EOMs intact. Fundi benign.               Neck: Supple, symmetrical, trachea midline, no adenopathy, thyroid: no enlargement/tenderness/nodules, no carotid bruit and no JVD.   Back:   Symmetric, no curvature. ROM normal. No CVA tenderness.   Lungs:   Clear to auscultation bilaterally.   Chest wall:  No tenderness or deformity.   Heart:  Regular rate and rhythm, S1, S2 normal, no murmur, click, rub or gallop.       Extremities: Wearing braces on lower legs bilaterally, no edema, right shoulder with limited ROM   Pulses: 2+ and symmetric all extremities.   Skin: Skin color, texture, turgor normal. No rashes or lesions.   Lymph nodes: Cervical, supraclavicular, and axillary nodes normal.   Neurologic: CNII-XII intact. Normal strength, sensation and reflexes throughout.       Assessment/Plan:       ICD-10-CM ICD-9-CM    1. Neuropathic pain M79.2 729.2 pregabalin (LYRICA) 50 mg capsule  Discontinue gabapentin and start lyrica 50 mg daily then if tolerated can increase to bid   2. Essential hypertension I10 401.9 Advised compliance with meds  Recheck bp at home and monitor   3. Osteoarthritis, unspecified osteoarthritis type, unspecified site M19.90 715.90 Continue tylenol       Follow-up Disposition: Not on File   Advised her to call back or return to office if symptoms worsen/change/persist.  Discussed expected course/resolution/complications of diagnosis in detail with patient.    Medication risks/benefits/costs/interactions/alternatives discussed with patient.  She was given an after visit summary which includes diagnoses, current medications, & vitals.   She expressed understanding with the diagnosis and plan.

## 2017-09-04 NOTE — Progress Notes (Signed)
No chief complaint on file.    1. Have you been to the ER, urgent care clinic since your last visit?  Hospitalized since your last visit?No    2. Have you seen or consulted any other health care providers outside of the [facility] since your last visit?  Include any pap smears or colon screening. No

## 2017-09-10 NOTE — Telephone Encounter (Signed)
-----   Message from Woody Seller sent at 09/10/2017  1:01 PM EST -----  Regarding: Dr. Cira Servant / Rx refill  Contact: [phone]  Pt would rather up the dosage of the Gabapentin to 300 mgs instead of the Lyrica. Please sent Rx to [facility], pharmacy on file.

## 2017-09-11 MED ORDER — ALENDRONATE 70 MG TAB
70 mg | ORAL_TABLET | ORAL | 0 refills | Status: DC
Start: 2017-09-11 — End: 2017-10-07

## 2017-09-23 NOTE — Telephone Encounter (Signed)
Kimberly [name], Kimberly [name] (Self) [phone] (H)       Patient's nurse Ms Joya Gaskins called asking about a Rx for Simvastatin 5 mg? Says patient is asking if this is a Rx that she needs to get filled? Also nurse says she was looking at a bottle dated 2015. She would like for [place] to call patient home no# (above) in case she has left for the day to advise on this Rx? Thanks!

## 2017-09-23 NOTE — Telephone Encounter (Signed)
Patient has decided to continue to with the 30mg  three times a day of gabapentin not going to take the lyrica it was to0 expensive.     [name], LPN

## 2017-10-10 MED ORDER — ALENDRONATE 70 MG TAB
70 mg | ORAL_TABLET | ORAL | 0 refills | Status: DC
Start: 2017-10-10 — End: 2017-12-06

## 2017-10-18 MED ORDER — CILOSTAZOL 100 MG TAB
100 mg | ORAL_TABLET | ORAL | 0 refills | Status: DC
Start: 2017-10-18 — End: 2018-06-16

## 2017-10-18 MED ORDER — NORTRIPTYLINE 10 MG CAP
10 mg | ORAL_CAPSULE | ORAL | 0 refills | Status: DC
Start: 2017-10-18 — End: 2018-01-13

## 2017-11-12 NOTE — Telephone Encounter (Signed)
Please ask he daughter if she has been approved to go back on fosamax. Her dentist stopped it for her dental procedure. He needs to decide if she should be back on it.

## 2017-11-13 MED ORDER — OXYBUTYNIN CHLORIDE 5 MG TAB
5 mg | ORAL_TABLET | ORAL | 0 refills | Status: DC
Start: 2017-11-13 — End: 2018-02-09

## 2017-11-17 MED ORDER — GABAPENTIN 100 MG CAP
100 mg | ORAL_CAPSULE | ORAL | 0 refills | Status: DC
Start: 2017-11-17 — End: 2018-02-05

## 2017-11-18 NOTE — Telephone Encounter (Signed)
Called and left VM for pt to contact office concerning her fosamax refill. [name], LPN

## 2017-11-29 NOTE — Telephone Encounter (Signed)
Duanna, Runk (Self) [phone] (M)     Patient called to stating that the Rx fosamax needed a prior auth; but per nurse Lavella Lemons she tried to call the patient to get her to call dentist that took her off of the fosamax and see if it was okay for her to start back taking the Rx?

## 2017-12-01 MED ORDER — EZETIMIBE 10 MG TAB
10 mg | ORAL_TABLET | ORAL | 0 refills | Status: DC
Start: 2017-12-01 — End: 2018-03-02

## 2017-12-06 MED ORDER — ALENDRONATE 70 MG TAB
70 mg | ORAL_TABLET | ORAL | 0 refills | Status: DC
Start: 2017-12-06 — End: 2018-06-16

## 2017-12-31 ENCOUNTER — Encounter

## 2018-01-07 ENCOUNTER — Ambulatory Visit: Payer: MEDICARE | Primary: Family Medicine

## 2018-01-13 MED ORDER — NORTRIPTYLINE 10 MG CAP
10 mg | ORAL_CAPSULE | ORAL | 0 refills | Status: DC
Start: 2018-01-13 — End: 2018-02-05

## 2018-01-14 ENCOUNTER — Encounter

## 2018-01-14 ENCOUNTER — Inpatient Hospital Stay: Admit: 2018-01-14 | Payer: MEDICARE | Attending: Internal Medicine | Primary: Family Medicine

## 2018-01-14 DIAGNOSIS — Z1231 Encounter for screening mammogram for malignant neoplasm of breast: Secondary | ICD-10-CM

## 2018-01-14 DIAGNOSIS — R928 Other abnormal and inconclusive findings on diagnostic imaging of breast: Secondary | ICD-10-CM

## 2018-01-14 NOTE — Telephone Encounter (Signed)
[name] from [facility] Radiology Department reported that the patient received a 3D mammogram today. The findings indicated patient needs a follow up for a U/S: L breast biopsy, due to a mass. Patient already has appointment scheduled for 01/21/2018 at 11:00am. Please place order. [name] can be reached at [phone]. Thanks!    Test: IMG [id number]  Dx: Ciro.Papas

## 2018-01-14 NOTE — Progress Notes (Signed)
1555 - called coordination of care and spoke with Beth to place pt. On schedule for 01/21/2018 at 11:00 a.m. For her U/S Guided Left Breast Biopsy here at [facility] U/S room 3.  Beth states on schedule.

## 2018-01-14 NOTE — Progress Notes (Signed)
Per Dr. Sharyn Creamer request - nurse in room to get pt. Scheduled for U/S guided Left breast biopsy.  Pt states her Grandmother, mother and her daughter all had breast cancer along with several other family members with cancer of different types.

## 2018-01-14 NOTE — Progress Notes (Signed)
Called Dr. Alcide Evener office to obtain order for pt. To have U/S guided left breast biopsy - spoke with Oley Balm who states Dr. Cira Servant will be in the office tomorrow and she will send a message to her to place an order.  Informed Oley Balm pt was scheduled on 01/21/2018 at 11:00 here at [facility] U/S dept. And she stated she would send that in the message to Dr. Cira Servant also.

## 2018-01-14 NOTE — Progress Notes (Signed)
1555 - called coordination of care and spoke with Beth to place pt. On schedule for 01/21/2018 at 11:00 a.m. For her U/S Guided Left Breast Biopsy here at [facility] U/S room 3.  Beth states on schedule.

## 2018-01-14 NOTE — Progress Notes (Signed)
Called Dr. Alcide Evener office to obtain order for pt. To have U/S guided left breast biopsy - spoke with Oley Balm who states Dr. Cira Servant will be in the office tomorrow and she will send a message to her to place an order.  Informed Oley Balm pt was scheduled on 01/21/2018 at 11:00 here at [facility] U/S dept. And she stated she would send that in the message to Dr. Cira Servant also.

## 2018-01-15 NOTE — Telephone Encounter (Signed)
Mohammad,Linda (EC) [phone] (H)      Ms Inda Merlin called stating that patient is due for a biopsy on 01/21/18 at 11:00 am. Patient was told not to take any medications and Ms Inda Merlin is concerned because pt is taking medication for her pain (advil, Asprin, etc) and she says pt is 84 yrs and she doesn't want patient to suffer? Please advise? Thanks!

## 2018-01-15 NOTE — Telephone Encounter (Signed)
Tried to call radiology was unable to speak with [place]. Provider has signed order as requested. [name], LPN

## 2018-01-15 NOTE — Telephone Encounter (Signed)
Called and spoke with Kimberly [name] and explained that she would need to contact the provider who has given her directions. [name], LPN

## 2018-01-15 NOTE — Telephone Encounter (Signed)
Kimberly [name] [phone] [facility]        Radiology says they are awaiting Dr. Cira Servant to put in order for pt's biopsy. Says they have sent information needed. Please advise? Thanks!

## 2018-01-21 ENCOUNTER — Inpatient Hospital Stay: Admit: 2018-01-21 | Payer: MEDICARE | Attending: Internal Medicine | Primary: Family Medicine

## 2018-01-21 ENCOUNTER — Encounter

## 2018-01-21 ENCOUNTER — Ambulatory Visit: Payer: MEDICARE | Primary: Family Medicine

## 2018-01-21 DIAGNOSIS — N6321 Unspecified lump in the left breast, upper outer quadrant: Secondary | ICD-10-CM

## 2018-01-21 DIAGNOSIS — R928 Other abnormal and inconclusive findings on diagnostic imaging of breast: Secondary | ICD-10-CM

## 2018-01-21 MED ORDER — OMEPRAZOLE 40 MG CAP, DELAYED RELEASE
40 mg | ORAL_CAPSULE | ORAL | 0 refills | Status: DC
Start: 2018-01-21 — End: 2018-02-05

## 2018-01-21 NOTE — Other (Signed)
Discharge instructions reviewed with understanding, questions reviewed, copy given to patient and her niece (POA Marlou Porch).      Post procedure Mammo completed and reviewed MD, pt cleared for discharge.    Biopsy site clean and dry, hemostasis achieved.    Steri strips with DSD placed.  Ice pack held by patient.      Pt verbalized she would like to be notified by phone of any results and to also call Marlou Porch.  Pt and niece encouraged to call department if she has not received her results in 3-5 business days.   Pt's niece advised not answer cell phone call while driving.      Specimen verified w/[facility] prior to being carried to lab by RN.

## 2018-01-21 NOTE — Other (Signed)
Dr. Lucretia Kern present for pre procedure consult and consent - questions reviewed with understanding - consent signed.  Discharge instructions reviewed w/pt and niece verbalizing understanding, will review again at discharge.

## 2018-01-21 NOTE — Progress Notes (Signed)
Routed final pathology report to Dr. Cira Servant.

## 2018-01-21 NOTE — Progress Notes (Signed)
1142 - called Dr. Rocco Pauls office and spoke with Judson Roch who states that Dr. Cira Servant will be back on 01/24/2018 and she would send the message - inquired if someone else in the practice could advise in reference to breast surgeon recommendation and Judson Roch states she will send it to the nurses and advised our telephone number [phone].

## 2018-01-23 NOTE — Telephone Encounter (Signed)
#  [phone] [facility] mammography department called. They stated that the patients breast biopsy came back positive and they want to know what surgeon Dr. Cira Servant wants to send the patient to. I advised them that Dr. Cira Servant is out of the office today, but would be back tomorrow. They asked if anyone else in the practice could make that decision, as to which surgeon to send the patient too.

## 2018-01-23 NOTE — Progress Notes (Signed)
Routed final pathology report to Dr. Cira Servant.

## 2018-01-23 NOTE — Progress Notes (Signed)
1142 - called Dr. Rocco Pauls office and spoke with Judson Roch who states that Dr. Cira Servant will be back on 01/24/2018 and she would send the message - inquired if someone else in the practice could advise in reference to breast surgeon recommendation and Judson Roch states she will send it to the nurses and advised our telephone number [phone].

## 2018-01-24 NOTE — Telephone Encounter (Signed)
01/24/2018 @ 1513 : Dr. Lysbeth Galas reviewed Dr. Lucretia Kern pathology report and notified patient's niece, Marlou Porch of positive result.  Dr. Alcide Evener office advised they will inform Dr. Cira Servant on Monday of pathology report.  (Ms. Inda Merlin requested first available surgeon @ [facility])    Made first available appointment was w/Dr. Jearl Klinefelter for Tuesday, 01/28/18 @ 9am, arriving at 830am.  Informed to bring photo ID, insurance card, copay and list of medications which niece advised she would.  Ms. Inda Merlin (niece) advised of address & suite # of Dr. Roselyn Meier office w/contact #.

## 2018-01-28 ENCOUNTER — Ambulatory Visit: Admit: 2018-01-28 | Discharge: 2018-01-28 | Payer: MEDICARE | Attending: Surgery | Primary: Family Medicine

## 2018-01-28 ENCOUNTER — Ambulatory Visit: Attending: Surgery | Primary: Family Medicine

## 2018-01-28 DIAGNOSIS — Z171 Estrogen receptor negative status [ER-]: Secondary | ICD-10-CM

## 2018-01-28 DIAGNOSIS — C50412 Malignant neoplasm of upper-outer quadrant of left female breast: Secondary | ICD-10-CM

## 2018-01-28 NOTE — Addendum Note (Signed)
Addended by: Jonathon Bellows R on: 01/28/2018 01:07 PM     Modules accepted: Orders

## 2018-01-28 NOTE — Progress Notes (Signed)
Chief Complaint   Patient presents with   ??? Breast Cancer     Seen at the request of Dr. Maeola Sarah, eval POS breast CA.      1. Have you been to the ER, urgent care clinic since your last visit?  Hospitalized since your last visit?No    2. Have you seen or consulted any other health care providers outside of the [place] since your last visit?  Include any pap smears or colon screening. No

## 2018-01-28 NOTE — Progress Notes (Signed)
HISTORY OF PRESENT ILLNESS  Kimberly [name] is a 84 y.o. female who comes in for consultation by Darlys Gales, MD for a new left breast cancer  Breast Cancer   Associated symptoms include headaches. Pertinent negatives include no chest pain, no abdominal pain and no shortness of breath.   she went for screening mammogram and was noted to have a density in the UOQ of the left breast.  Subsequently she had an [place] confirming a 2.0 x 1.8 x 1.6 cm in the 0100 position 6 cm from nipple.   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  She had not felt a lump, skin changes, nipple changes or drainage, unexplained weight loss or bone pain.  She had menarche at 30, only pregnancy at 61, menopause at 34 and did not take HRT.  Her MGM, M, D, and sister all died from metastatic breast cancer.       Past Medical History:   Diagnosis Date   ??? Anginal pain ([place])    ??? Arthritis    ??? Cancer () 01/2018   ??? Chronic pain    ??? Constipation    ??? GERD (gastroesophageal reflux disease)    ??? Hearing loss    ??? Hearing loss    ??? High cholesterol    ??? Hypertension    ??? Incontinence    ??? Joint pain    ??? Memory disorder    ??? Muscle pain    ??? Osteoporosis    ??? Ringing in the ears    ??? Snoring    ??? Visual disturbance      Past Surgical History:   Procedure Laterality Date   ??? HX HYSTERECTOMY     ??? HX ORTHOPAEDIC      epidural pain mtg   ??? HX OTHER SURGICAL Right 10/15/2016    ligament correction right foot   ??? HX OTHER SURGICAL  11/26/2016    Pituitary tumor removal     Family History   Problem Relation Age of Onset   ??? Breast Cancer Mother 27   ??? Breast Cancer Sister 6   ??? Breast Cancer Daughter 43   ??? Dementia Brother    ??? Dementia Sister      Social History     Tobacco Use   ??? Smoking status: Never Smoker   ??? Smokeless tobacco: Never Used   Substance Use Topics   ??? Alcohol use: No   ??? Drug use: No     Current Outpatient Medications   Medication Sig    ??? ezetimibe (ZETIA) 10 mg tablet TAKE 1 TABLET BY MOUTH DAILY   ??? gabapentin (NEURONTIN) 100 mg capsule TAKE 1 CAPSULE BY MOUTH THREE TIMES DAILY   ??? estradiol (VAGIFEM) 10 mcg tab vaginal tablet INSERT 1 T VAGINALLY TWICE PER WEEK   ??? verapamil ER (CALAN-SR) 180 mg CR tablet TAKE 1 TABLET BY MOUTH EVERY NIGHT   ??? isosorbide dinitrate (ISORDIL) 10 mg tablet TAKE 1 TABLET BY MOUTH TWICE DAILY   ??? lisinopril (PRINIVIL) 10 mg tablet Take 1 Tab by mouth daily.   ??? cholecalciferol (VITAMIN D3) 1,000 unit cap Take  by mouth daily.   ??? acetaminophen (TYLENOL) 325 mg tablet Take 1 Tab by mouth three (3) times daily.   ??? Walker misc 1 Units by Does Not Apply route daily.   ??? aspirin 81 mg chewable tablet Take 81 mg by mouth daily.   ??? omeprazole (PRILOSEC)  40 mg capsule TAKE 1 CAPSULE BY MOUTH DAILY   ??? nortriptyline (PAMELOR) 10 mg capsule TAKE 1 CAPSULE BY MOUTH EVERY NIGHT   ??? alendronate (FOSAMAX) 70 mg tablet TAKE 1 TABLET BY MOUTH EVERY 7 DAYS   ??? oxybutynin (DITROPAN) 5 mg tablet TAKE 1 TABLET BY MOUTH TWICE DAILY   ??? cilostazol (PLETAL) 100 mg tablet TAKE 1 TABLET BY MOUTH TWICE DAILY ON EMPTY STOMACH   ??? pregabalin (LYRICA) 50 mg capsule Take 1 Cap by mouth two (2) times a day. Max Daily Amount: 100 mg.   ??? omeprazole (PRILOSEC) 40 mg capsule TAKE 1 CAPSULE BY MOUTH DAILY   ??? famotidine (PEPCID) 20 mg tablet Take 1 Tab by mouth two (2) times a day.   ??? hydrocortisone (CORTEF) 10 mg tablet TK 1 T PO Q 12 H   ??? simvastatin (ZOCOR) 5 mg tablet Take  by mouth nightly.   ??? polyethylene glycol (MIRALAX) 17 gram/dose powder TAKE 1 CAPFUL PO ONCE A DAY   ??? coenzyme q10-vitamin e (COQ10 SG 100) 100-100 mg-unit cap Take  by mouth.   ??? VITAMIN E MIXED (NATURAL VITAMIN E PO) Take 400 Units by mouth. 3 x week    ??? vitamin A 8,000 unit capsule Take 8,000 Units by mouth daily.     No current facility-administered medications for this visit.      Allergies   Allergen Reactions   ??? Pcn [Penicillins] Hives and Myalgia    ??? Sulfa (Sulfonamide Antibiotics) Unknown (comments)         Review of Systems   Constitutional: Positive for malaise/fatigue. Negative for chills, diaphoresis, fever and weight loss.   HENT: Positive for hearing loss. Negative for sore throat.    Eyes: Negative for blurred vision and discharge.        Vision loss   Respiratory: Negative for cough, shortness of breath and wheezing.    Cardiovascular: Positive for leg swelling. Negative for chest pain, palpitations, orthopnea and claudication.   Gastrointestinal: Positive for nausea. Negative for abdominal pain, constipation, diarrhea, heartburn, melena and vomiting.   Genitourinary: Positive for dysuria. Negative for flank pain, frequency and hematuria.   Musculoskeletal: Negative for back pain, joint pain, myalgias and neck pain.   Skin: Negative for rash.   Neurological: Positive for headaches. Negative for dizziness, speech change, focal weakness, seizures, loss of consciousness and weakness.   Endo/Heme/Allergies: Does not bruise/bleed easily.   Psychiatric/Behavioral: Negative for depression and memory loss.     Visit Vitals  BP 126/62 (BP 1 Location: Left arm, BP Patient Position: Sitting)   Pulse 72   Temp 96.7 ??F (35.9 ??C) (Oral)   Resp 16   Ht '5\' 3"'  (1.6 m)   Wt 60.3 kg (133 lb)   SpO2 95%   BMI 23.56 kg/m??       Physical Exam   Constitutional: She is oriented to person, place, and time. She appears well-developed and well-nourished. No distress.   HENT:   Head: Normocephalic and atraumatic.   Mouth/Throat: Oropharynx is clear and moist. No oropharyngeal exudate.   Eyes: Pupils are equal, round, and reactive to light. Conjunctivae and EOM are normal. No scleral icterus.   Neck: Normal range of motion. Neck supple. No JVD present. No tracheal deviation present. No thyromegaly present.   Cardiovascular: Normal rate and regular rhythm. Exam reveals no gallop and no friction rub.   No murmur heard.   Pulmonary/Chest: Effort normal and breath sounds normal. No respiratory distress. She has  no wheezes. She has no rales. Right breast exhibits no inverted nipple, no mass, no nipple discharge, no skin change and no tenderness. Left breast exhibits mass (3 cm mass in UOQ with bruising and tenderness), skin change and tenderness. Left breast exhibits no inverted nipple and no nipple discharge. Breasts are symmetrical.       Abdominal: Soft. Bowel sounds are normal. She exhibits no distension and no mass. There is no tenderness. There is no rebound and no guarding.   Musculoskeletal: Normal range of motion. She exhibits no edema.   Lymphadenopathy:     She has no cervical adenopathy.     She has no axillary adenopathy.        Right: No supraclavicular adenopathy present.        Left: No supraclavicular adenopathy present.   Neurological: She is alert and oriented to person, place, and time. No cranial nerve deficit.   Skin: Skin is warm and dry. No rash noted. She is not diaphoretic. No erythema. No pallor.   Psychiatric: Her behavior is normal. Judgment and thought content normal.     I reviewed her mammogram and [place] images today    ASSESSMENT and PLAN  1.  Newly diagnosed left breast cancer early stage, Clinically stage 2A (T2N0M0). Triple negative Dx 01/2018.   While spry and cognitively intact she is still not a very good candidate for chemotherapy.  I spent over 60 minutes discussing the anatomy and pathophysiology of breast cancer and treatment options, prognosis with her and her niece.  We discussed breast conservation therapy vs mastectomy with and without reconstruction, sentinel lymph node biopsy and axillary dissection, various forms of radiation (whole vs accelerated partial breast), medical oncology therapy (SERM vs Aromatase inhibitors, chemotherapy, herceptin).  I discussed about BRCA genetic testing and oncotype DX gene mapping of the tumor.  I  discussed the risks and benefits of surgery including bleeding, infection, paresthesias and numbness, cosmetic changes, lymphedema, seroma, chronic pain, and need for reoperation for positive margins/sentinel nodes. I discussed websites for her to review.  2.  Strong family hx breast cancer  Recommended and she agreed to genetic testing today with [facility]  3.  Allergy to PCN with hives and upset stomach.  Ok for Ancef  4.  Essential hypertension.  On rx    She has elected to undergo a left mastectomy with SLNB/possible ALND under general anesthesia as an outpatient with an overnight stay  She hopes to avoid RT if nodes are negative      Luellen Pucker, MD FACS

## 2018-01-28 NOTE — Patient Instructions (Signed)
Surgery Instruction Sheet    You have been scheduled for surgery on 02/13/2018 at 12:00pm at [facility]  Please report to the Surgery Center at 9:30am, this is approximately  hours prior to your surgery time.  The Surgery Center is located on the [place] side of the hospital, just next to the Emergency Room.  Reserved parking is available and Valet parking if lot is full.      You will need to have a Pre-op Visit prior to your surgery.  Report to the Surgery Center on 02/05/2018 at 2:00pm.  Bring a list of medications and your insurance cards with you. You may eat/drink prior to this visit.      Call your physician immediately if you notice a change in your health between the time you saw your physician and the day of surgery.    If you take a blood thinner, please let us know.  Call your ordering Doctor to make sure you can stop taking it prior to your surgery. STOP YOUR ASPIRIN 10 DAYS PRIOR TO SURGERY.    DO NOT TAKE  IBUPROFEN, ADVIL, MOTRIN, ALEVE, EXCEDRIN, BC POWDER, GOODIES, FISH OIL OR ANY MEDICATION CONTAINING ASPIRIN 10 DAYS PRIOR TO YOUR SURGERY. MAY TAKE TYLENOL.     Eat a light dinner the evening before your surgery.  DO NOT EAT OR DRINK ANYTHING AFTER MIDNIGHT THE NIGHT BEFORE YOUR SURGERY.  This includes water, chewing gum, lifesavers, etc.  The Pre op nurse will check with you about any medication that you may need to take the morning of surgery.    Shower with a new bar of anti-bacterial soap (Dial, Safeguard) or solution given to you by Pre-op, the night before surgery. Do not use lotion, powder, deodorant on the skin after showering.  Wear loose, comfortable clothing the day of surgery and bring a container to store your contacts, eyeglasses, dentures, hearing aid, etc.  Do not bring money, valuables, jewelry, etc. to the hospital.      If you are having outpatient surgery, someone must come with you the  morning of surgery to drive you home.  You can not drive for 24 hours after any anesthesia.  Sometimes it is necessary to stay overnight and leave the next morning.  This is still considered outpatient for most insurance deductibles.  Someone will still need to drive you home.      If you have questions or concerns, please feel free to call Dr Silvana Newness at [phone].  If you need to cancel your surgery, please call as soon as possible.

## 2018-01-28 NOTE — Progress Notes (Signed)
ONCOLOGY NURSE NAVIGATOR    Kimberly [name] 84 yo    Dx: L Breast Ca Triple Negative    ONN referred to see pt, newly dx. Introduced self and role. Niece present w/ pt today.     CLINICAL:   Lumpectomy w/ RT vs Mastectomy  Genetic testing (dtr died of breast cancer, sister had breast cancer)  Rad Onc/Med Onc referral after OR  Hx neuropathy  Provided folder w/ [facility] [employer] (advised resources and links for websites)   Address ACP in future    SOCIAL: Has a niece who lives w her. Hx falls. Wears medical alert bracelet.     FINANCIAL: No barriers    Arrie Aran RN

## 2018-01-28 NOTE — Progress Notes (Signed)
ONCOLOGY NURSE NAVIGATOR    Elaine Roanhorse 84 yo    Dx: L Breast Ca Triple Negative    ONN referred to see pt, newly dx. Introduced self and role. Niece present w/ pt today.     CLINICAL:   Lumpectomy w/ RT vs Mastectomy  Genetic testing (dtr died of breast cancer, sister had breast cancer)  Rad Onc/Med Onc referral after OR  Hx neuropathy  Provided folder w/ [facility] [employer] (advised resources and links for websites)   Address ACP in future    SOCIAL: Has a niece who lives w her. Hx falls. Wears medical alert bracelet.     FINANCIAL: No barriers    Arrie Aran RN

## 2018-01-28 NOTE — Progress Notes (Signed)
Progress Notes by Luellen Pucker, MD at 01/28/18 0900                Author: Luellen Pucker, MD  Service: --  Author Type: Physician       Filed: 01/28/18 1242  Encounter Date: 01/28/2018  Status: Signed          Editor: Luellen Pucker, MD (Physician)               HISTORY OF PRESENT ILLNESS   Kimberly [name] is a 84 y.o.  female who comes in for consultation by Darlys Gales, MD for a new left breast cancer   Breast Cancer    Associated symptoms include headaches. Pertinent negatives include no chest pain,  no abdominal pain and no shortness of breath.    she went for screening mammogram and was noted to have a density in the UOQ of the left breast.  Subsequently she had an [place] confirming a 2.0 x 1.8 x 1.6 cm in the 0100 position 6 cm from nipple.   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  She had not felt a lump, skin changes, nipple changes or drainage, unexplained weight loss or bone pain.  She had menarche at 43, only pregnancy at  26, menopause at 89 and did not take HRT.  Her MGM, M, D, and sister all died from metastatic breast cancer.           Past Medical History:        Diagnosis  Date         ?  Anginal pain ([place])       ?  Arthritis       ?  Cancer ([place])  01/2018     ?  Chronic pain       ?  Constipation       ?  GERD (gastroesophageal reflux disease)       ?  Hearing loss       ?  Hearing loss       ?  High cholesterol       ?  Hypertension       ?  Incontinence       ?  Joint pain       ?  Memory disorder       ?  Muscle pain       ?  Osteoporosis       ?  Ringing in the ears       ?  Snoring           ?  Visual disturbance            Past Surgical History:         Procedure  Laterality  Date          ?  HX HYSTERECTOMY         ?  HX ORTHOPAEDIC              epidural pain mtg          ?  HX OTHER SURGICAL  Right  10/15/2016          ligament correction right foot          ?  HX OTHER SURGICAL     11/26/2016          Pituitary tumor removal          Family History  Problem  Relation  Age of Onset          ?  Breast Cancer  Mother  20     ?  Breast Cancer  Sister  28     ?  Breast Cancer  Daughter  24     ?  Dementia  Brother            ?  Dementia  Sister            Social History          Tobacco Use         ?  Smoking status:  Never Smoker     ?  Smokeless tobacco:  Never Used       Substance Use Topics         ?  Alcohol use:  No         ?  Drug use:  No          Current Outpatient Medications        Medication  Sig         ?  ezetimibe (ZETIA) 10 mg tablet  TAKE 1 TABLET BY MOUTH DAILY     ?  gabapentin (NEURONTIN) 100 mg capsule  TAKE 1 CAPSULE BY MOUTH THREE TIMES DAILY     ?  estradiol (VAGIFEM) 10 mcg tab vaginal tablet  INSERT 1 T VAGINALLY TWICE PER WEEK     ?  verapamil ER (CALAN-SR) 180 mg CR tablet  TAKE 1 TABLET BY MOUTH EVERY NIGHT     ?  isosorbide dinitrate (ISORDIL) 10 mg tablet  TAKE 1 TABLET BY MOUTH TWICE DAILY     ?  lisinopril (PRINIVIL) 10 mg tablet  Take 1 Tab by mouth daily.     ?  cholecalciferol (VITAMIN D3) 1,000 unit cap  Take  by mouth daily.     ?  acetaminophen (TYLENOL) 325 mg tablet  Take 1 Tab by mouth three (3) times daily.     ?  Walker misc  1 Units by Does Not Apply route daily.     ?  aspirin 81 mg chewable tablet  Take 81 mg by mouth daily.     ?  omeprazole (PRILOSEC) 40 mg capsule  TAKE 1 CAPSULE BY MOUTH DAILY     ?  nortriptyline (PAMELOR) 10 mg capsule  TAKE 1 CAPSULE BY MOUTH EVERY NIGHT     ?  alendronate (FOSAMAX) 70 mg tablet  TAKE 1 TABLET BY MOUTH EVERY 7 DAYS     ?  oxybutynin (DITROPAN) 5 mg tablet  TAKE 1 TABLET BY MOUTH TWICE DAILY     ?  cilostazol (PLETAL) 100 mg tablet  TAKE 1 TABLET BY MOUTH TWICE DAILY ON EMPTY STOMACH     ?  pregabalin (LYRICA) 50 mg capsule  Take 1 Cap by mouth two (2) times a day. Max Daily Amount: 100 mg.     ?  omeprazole (PRILOSEC) 40 mg capsule  TAKE 1 CAPSULE BY MOUTH DAILY     ?  famotidine (PEPCID) 20 mg tablet   Take 1 Tab by mouth two (2) times a day.     ?  hydrocortisone (CORTEF) 10 mg tablet  TK 1 T PO Q 12 H     ?  simvastatin (ZOCOR) 5 mg tablet  Take  by mouth nightly.     ?  polyethylene glycol (MIRALAX) 17 gram/dose powder  TAKE 1 CAPFUL PO ONCE A DAY     ?  coenzyme q10-vitamin e (COQ10 SG 100) 100-100 mg-unit cap  Take  by mouth.     ?  VITAMIN E MIXED (NATURAL VITAMIN E PO)  Take 400 Units by mouth. 3 x week          ?  vitamin A 8,000 unit capsule  Take 8,000 Units by mouth daily.          No current facility-administered medications for this visit.           Allergies        Allergen  Reactions         ?  Pcn [Penicillins]  Hives and Myalgia         ?  Sulfa (Sulfonamide Antibiotics)  Unknown (comments)              Review of Systems    Constitutional: Positive for malaise/fatigue. Negative for chills, diaphoresis, fever and weight loss.    HENT: Positive for hearing loss. Negative for sore throat.     Eyes: Negative for blurred vision and discharge.         Vision loss    Respiratory: Negative for cough, shortness of breath and wheezing.     Cardiovascular: Positive for leg swelling. Negative for chest pain, palpitations, orthopnea and claudication.    Gastrointestinal: Positive for nausea. Negative for abdominal pain, constipation, diarrhea, heartburn, melena and vomiting.    Genitourinary: Positive for dysuria. Negative for flank pain, frequency and hematuria.    Musculoskeletal: Negative for back pain, joint pain, myalgias and neck pain.    Skin: Negative for rash.    Neurological: Positive for headaches. Negative for dizziness, speech change, focal weakness, seizures, loss of consciousness and weakness.     Endo/Heme/Allergies: Does not bruise/bleed easily.    Psychiatric/Behavioral: Negative for depression and memory loss.       Visit Vitals      BP  126/62 (BP 1 Location: Left arm, BP Patient Position: Sitting)     Pulse  72     Temp  96.7 ??F (35.9 ??C) (Oral)     Resp  16     Ht  5' 3"  (1.6 m)     Wt   60.3 kg (133 lb)     SpO2  95%        BMI  23.56 kg/m??           Physical Exam    Constitutional: She is oriented to person, place, and time. She appears well-developed and well-nourished. No distress.    HENT:    Head: Normocephalic and atraumatic.    Mouth/Throat: Oropharynx is clear and moist. No oropharyngeal exudate.    Eyes: Pupils are equal, round, and reactive to light. Conjunctivae and EOM are normal. No scleral icterus.    Neck: Normal range of motion. Neck supple. No JVD present. No tracheal deviation present. No thyromegaly present.    Cardiovascular: Normal rate and regular rhythm. Exam reveals no gallop and no friction rub.    No murmur heard.   Pulmonary/Chest: Effort normal and breath sounds normal. No respiratory distress. She has no wheezes. She has no rales. Right breast exhibits no inverted nipple, no mass, no nipple discharge, no skin change and no tenderness. Left breast exhibits  mass (3 cm mass in UOQ with bruising and tenderness),  skin change and tenderness. Left breast exhibits no inverted nipple and no nipple  discharge. Breasts are symmetrical.          Abdominal: Soft. Bowel  sounds are normal. She exhibits no distension and no mass. There is no tenderness. There is no rebound and no guarding.   Musculoskeletal: Normal range of motion. She exhibits no edema.   Lymphadenopathy:     She has no cervical adenopathy.     She has  no axillary adenopathy.        Right: No supraclavicular adenopathy present.        Left: No supraclavicular adenopathy present.    Neurological: She is alert and oriented to person, place, and time. No cranial nerve deficit.    Skin: Skin is warm and dry. No rash noted. She is not diaphoretic. No erythema. No pallor.   Psychiatric: Her behavior is normal.  Judgment and thought content normal.       I reviewed her mammogram and [place] images today      ASSESSMENT and PLAN   1.  Newly diagnosed left breast cancer early stage, Clinically stage 2A (T2N0M0). Triple negative  Dx 01/2018.   While spry and cognitively intact she is still not a very good candidate for chemotherapy.  I spent over 60 minutes discussing the anatomy and  pathophysiology of breast cancer and treatment options, prognosis with her and her niece.  We discussed breast conservation therapy vs mastectomy with and without reconstruction, sentinel lymph node biopsy and axillary dissection, various forms of radiation  (whole vs accelerated partial breast), medical oncology therapy (SERM vs Aromatase inhibitors, chemotherapy, herceptin).  I discussed about BRCA genetic testing and oncotype DX gene mapping of the tumor.  I discussed the risks and benefits of surgery  including bleeding, infection, paresthesias and numbness, cosmetic changes, lymphedema, seroma, chronic pain, and need for reoperation for positive margins/sentinel nodes. I discussed websites for her to review.   2.  Strong family hx breast cancer   Recommended and she agreed to genetic testing today with [facility]   3.  Allergy to PCN with hives and upset stomach.  Ok for Ancef   4.  Essential hypertension.  On rx      She has elected to undergo a left mastectomy with SLNB/possible ALND under general anesthesia as an outpatient with an overnight stay   She hopes to avoid RT if nodes are negative         Luellen Pucker, MD FACS

## 2018-01-28 NOTE — Progress Notes (Signed)
 Chief Complaint   Patient presents with   . Breast Cancer     Seen at the request of Dr. Andres Bonus, eval POS breast CA.      1. Have you been to the ER, urgent care clinic since your last visit?  Hospitalized since your last visit?No    2. Have you seen or consulted any other health care providers outside of the [facility] since your last visit?  Include any pap smears or colon screening. No

## 2018-01-28 NOTE — Addendum Note (Signed)
Addendum Note  by Gypsy Decant at 01/28/18 0900                Author: Jonathon Bellows R  Service: --  Author Type: Licensed Nurse       Filed: 01/28/18 1307  Encounter Date: 01/28/2018  Status: Signed          Editor: Jonathon Bellows R          Addended by: Jonathon Bellows R on: 01/28/2018 01:07 PM    Modules accepted: Orders

## 2018-01-29 ENCOUNTER — Telehealth

## 2018-01-29 NOTE — Telephone Encounter (Signed)
Phone (Incoming) Mohammad,Linda (EC) [phone] (H)      Patients caregiver called (Mohammad,Linda) stating that the patient has been diagnosed with breast cancer. Ms Inda Merlin would like for the nurse or Dr. Cira Servant to please give her a call back concerning what Oncologist Dr. Cira Servant will be referring the patient too? Please advise? Thanks!

## 2018-01-30 NOTE — Telephone Encounter (Signed)
Mohammad,Linda (EC) [phone] (H)     Ms. Billey Co called back today concerning message sent on 01/29/18. Please advise? Thanks!

## 2018-01-31 NOTE — Telephone Encounter (Signed)
Information provided to Ms. Mohammad. [name], LPN

## 2018-02-04 NOTE — Telephone Encounter (Signed)
Received a call from [facility] requesting records to be faxed to [phone].  Patient was only seen in our office once 01/28/2018 and records faxed.

## 2018-02-05 ENCOUNTER — Inpatient Hospital Stay: Admit: 2018-02-05 | Payer: MEDICARE | Primary: Family Medicine

## 2018-02-05 ENCOUNTER — Inpatient Hospital Stay: Admit: 2018-02-05 | Payer: MEDICARE | Attending: Surgery | Primary: Family Medicine

## 2018-02-05 DIAGNOSIS — Z01818 Encounter for other preprocedural examination: Secondary | ICD-10-CM

## 2018-02-05 LAB — COMPREHENSIVE METABOLIC PANEL
ALT: 25 U/L (ref 12–78)
AST: 19 U/L (ref 15–37)
Albumin/Globulin Ratio: 0.8 — ABNORMAL LOW (ref 1.1–2.2)
Albumin: 3.1 g/dL — ABNORMAL LOW (ref 3.5–5.0)
Alkaline Phosphatase: 84 U/L (ref 45–117)
Anion Gap: 5 mmol/L (ref 5–15)
BUN: 18 MG/DL (ref 6–20)
Bun/Cre Ratio: 22 — ABNORMAL HIGH (ref 12–20)
CO2: 25 mmol/L (ref 21–32)
Calcium: 9.1 MG/DL (ref 8.5–10.1)
Chloride: 109 mmol/L — ABNORMAL HIGH (ref 97–108)
Creatinine: 0.82 MG/DL (ref 0.55–1.02)
EGFR IF NonAfrican American: >60 mL/min/{1.73_m2} (ref >60)
GFR African American: >60 mL/min/{1.73_m2} (ref >60)
Globulin: 4 g/dL (ref 2.0–4.0)
Glucose: 94 mg/dL (ref 65–100)
Potassium: 4.3 mmol/L (ref 3.5–5.1)
Sodium: 139 mmol/L (ref 136–145)
Total Bilirubin: 0.2 MG/DL (ref 0.2–1.0)
Total Protein: 7.1 g/dL (ref 6.4–8.2)

## 2018-02-05 LAB — CBC
Hematocrit: 36.7 % (ref 35.0–47.0)
Hemoglobin: 11.4 g/dL — ABNORMAL LOW (ref 11.5–16.0)
MCH: 22.6 PG — ABNORMAL LOW (ref 26.0–34.0)
MCHC: 31.1 g/dL (ref 30.0–36.5)
MCV: 72.7 FL — ABNORMAL LOW (ref 80.0–99.0)
MPV: 10.5 FL (ref 8.9–12.9)
NRBC Absolute: 0 10*3/uL (ref 0.00–0.01)
Nucleated RBCs: 0 PER 100 WBC
Platelets: 273 10*3/uL (ref 150–400)
RBC: 5.05 M/uL (ref 3.80–5.20)
RDW: 15.6 % — ABNORMAL HIGH (ref 11.5–14.5)
WBC: 4.6 10*3/uL (ref 3.6–11.0)

## 2018-02-05 LAB — METABOLIC PANEL, COMPREHENSIVE
A-G Ratio: 0.8 — ABNORMAL LOW (ref 1.1–2.2)
ALT (SGPT): 25 U/L (ref 12–78)
AST (SGOT): 19 U/L (ref 15–37)
Albumin: 3.1 g/dL — ABNORMAL LOW (ref 3.5–5.0)
Alk. phosphatase: 84 U/L (ref 45–117)
Anion gap: 5 mmol/L (ref 5–15)
BUN/Creatinine ratio: 22 — ABNORMAL HIGH (ref 12–20)
BUN: 18 MG/DL (ref 6–20)
Bilirubin, total: 0.2 MG/DL (ref 0.2–1.0)
CO2: 25 mmol/L (ref 21–32)
Calcium: 9.1 MG/DL (ref 8.5–10.1)
Chloride: 109 mmol/L — ABNORMAL HIGH (ref 97–108)
Creatinine: 0.82 MG/DL (ref 0.55–1.02)
GFR est AA: >60 mL/min/{1.73_m2} (ref >60)
GFR est non-AA: >60 mL/min/{1.73_m2} (ref >60)
Globulin: 4 g/dL (ref 2.0–4.0)
Glucose: 94 mg/dL (ref 65–100)
Potassium: 4.3 mmol/L (ref 3.5–5.1)
Protein, total: 7.1 g/dL (ref 6.4–8.2)
Sodium: 139 mmol/L (ref 136–145)

## 2018-02-05 LAB — CBC W/O DIFF
ABSOLUTE NRBC: 0 10*3/uL (ref 0.00–0.01)
HCT: 36.7 % (ref 35.0–47.0)
HGB: 11.4 g/dL — ABNORMAL LOW (ref 11.5–16.0)
MCH: 22.6 PG — ABNORMAL LOW (ref 26.0–34.0)
MCHC: 31.1 g/dL (ref 30.0–36.5)
MCV: 72.7 FL — ABNORMAL LOW (ref 80.0–99.0)
MPV: 10.5 FL (ref 8.9–12.9)
NRBC: 0 PER 100 WBC
PLATELET: 273 10*3/uL (ref 150–400)
RBC: 5.05 M/uL (ref 3.80–5.20)
RDW: 15.6 % — ABNORMAL HIGH (ref 11.5–14.5)
WBC: 4.6 10*3/uL (ref 3.6–11.0)

## 2018-02-05 NOTE — Telephone Encounter (Signed)
Kimberly [name] called from [facility] [phone] stating patient schedule for surgery 02/13/2018 and she is taking pletal.  Advised her pr Dr. Silvana Newness to hold pletal 1 week prior to surgery.  She verbalized understanding.

## 2018-02-05 NOTE — Other (Addendum)
[facility]  Preoperative Instructions        Surgery Date: Thursday 02/13/18          Time of Arrival: 9:00 a.m. - Contact #:  [phone]    1. On the day of your surgery, please report to the Surgical Services Registration Desk and sign in at your designated time. The Surgery Center is located to the right of the Emergency Room.     2. You must have someone with you to drive you home. You should not drive a car for 24 hours following surgery. Please make arrangements for a friend or family member to stay with you for the first 24 hours after your surgery.    3. Do not have anything to eat or drink (including water, gum, mints, coffee, juice) after midnight  02/12/18     .?This may not apply to medications prescribed by your physician. ?(Please note below the special instructions with medications to take the morning of your procedure.)    4. We recommend you do not drink any alcoholic beverages for 24 hours before and after your surgery.    5. Contact your surgeon???s office for instructions on the following medications: non-steroidal anti-inflammatory drugs (i.e. Advil, Aleve), vitamins, and supplements. (Some surgeons will want you to stop these medications prior to surgery and others may allow you to take them)  **If you are currently taking Plavix, Coumadin, Aspirin and/or other blood-thinning agents, contact your surgeon for instructions.** Your surgeon will partner with the physician prescribing these medications to determine if it is safe to stop or if you need to continue taking.  Please do not stop taking these medications without instructions from your surgeon    6. Wear comfortable clothes.  Wear glasses instead of contacts.  Do not bring any money or jewelry. Please bring picture ID, insurance card, and any prearranged co-payment or hospital payment.  Do not wear make-up, particularly mascara the morning of your surgery.  Do not wear nail  polish, particularly if you are having foot /hand surgery.  Wear your hair loose or down, no ponytails, buns, bobby pins or clips.  All body piercings must be removed.  Please shower with antibacterial soap for three consecutive days before and on the morning of surgery, but do not apply any lotions, powders or deodorants after the shower on the day of surgery. Please use a fresh towels after each shower. Please sleep in clean clothes and change bed linens the night before surgery.  Please do not shave for 48 hours prior to surgery. Shaving of the face is acceptable.    7. You should understand that if you do not follow these instructions your surgery may be cancelled.  If your physical condition changes (I.e. fever, cold or flu) please contact your surgeon as soon as possible.    8. It is important that you be on time.  If a situation occurs where you may be late, please call [phone] (OR Holding Area).    9. If you have any questions and or problems, please call [phone] (Pre-admission Testing).    10. Your surgery time may be subject to change.  You will receive a phone call the evening prior if your time changes.    11.  If having outpatient surgery, you must have someone to drive you here, stay with you during the duration of your stay, and to drive you home at time of discharge.    12.   In an  effort to improve the efficiency, privacy, and safety for all of our Pre-op patients visitors are not allowed in the Holding area.  Once you arrive and are registered your family/visitors will be asked to remain in the waiting room.  The Pre-op staff will get you from the Surgical Waiting Area and will explain to you and your family/visitors that the Pre-op phase is beginning.  The staff will answer any questions and provide instructions for tracking of the patient, by use of the existing tracking number and color-coded status board in the waiting room.  At this  time the staff will also ask for your designated spokesperson information in the event that the physician or staff need to provide an update or obtain any pertinent information. The designated spokesperson will be notified if the physician needs to speak to family during the pre-operative phase.  If at any time your family/visitors has questions or concerns they may approach the volunteer desk in the waiting area for assistance.         Special Instructions:  1) Practice the incentive spirometer 20 breaths per day and bring with you to the hospital on the day of surgery.   2) Follow-up with Dr. Sampson Goon on Friday 02/07/18 at 3:15 pm at [place] A SIP OF WATER: gabapentin, omeprazole, oxybutynin, Lyrica.      I understand a pre-operative phone call will be made to verify my surgery time.  In the event that I am not available, I give permission for a message to be left on my answering service and/or with another person?  Yes         ___________________      __________   _________    (Signature of Patient)             (Witness)                (Date and Time)

## 2018-02-05 NOTE — Other (Addendum)
Incentive Spirometer        Using the incentive spirometer helps expand the small air sacs of your lungs, helps you breathe deeply, and helps improve your lung function.  Use your incentive spirometer twice a day (10 breaths each time) prior to surgery.      How to Use Your Incentive Spirometer:  1. Hold the incentive spirometer in an upright position.   2. Breathe out as usual.   3. Place the mouthpiece in your mouth and seal your lips tightly around it.   4. Take a deep breath.  Breathe in slowly and as deeply as possible. Keep the blue flow rate guide between the arrows.   5. Hold your breath as long as possible. Then exhale slowly and allow the piston to fall to the bottom of the column.   6. Rest for a few seconds and repeat steps one through five at least 10 times.     PAT Tidal Volume_____1250-1500_____  x____2_______  Date____7/3/19_________    Margretta Sidle THE INCENTIVE SPIROMETER WITH YOU TO THE HOSPITAL ON THE DAY OF YOUR SURGERY.  Opportunity given to ask and answer questions as well as to observe return demonstration.    Patient signature_____________________________    Witness____________________

## 2018-02-05 NOTE — Other (Addendum)
Telephone order received from Dr. MacDougall/Lisa for patient to discontinue PLETAL one week prior to surgery.  VM Message left for patient's niece Marlou Porch [phone]) with detailed instructions, also requesting call back to confirm receipt of message.      Marlou Porch returned call and verbalized understanding of PLETAL instructions.  She will have the patient discontinue Pletal tomorrow, one week prior to surgery, and will bring patient for follow-up with Dr. Sampson Goon as scheduled on Friday 02/07/18 at 3:15 pm.

## 2018-02-05 NOTE — Interval H&P Note (Signed)
Periop  Notes by Carmin Muskrat A at 02/05/18 1435                Author: Carmin Muskrat A  Service: --  Author Type: Registered Nurse       Filed: 02/05/18 1546  Date of Service: 02/05/18 1435  Status: Addendum          Editor: Carmin Muskrat A          Related Notes: Original Note by Elaina Pattee filed at 02/05/18 [facility]   Preoperative Instructions            Surgery Date: Thursday 02/13/18          Time  of Arrival: 9:00 a.m. - Contact #:  [phone]      1. On the day of your surgery, please report to the Surgical Services Registration Desk and sign in at your designated time. The Surgery Center is located to the right of  the Emergency Room.       2. You must have someone with you to drive you home. You should not drive a car for 24 hours following surgery. Please make arrangements for a friend or family member to stay with you for the first 24 hours after your surgery.      3. Do not have anything to eat or drink (including water, gum, mints, coffee, juice) after midnight  02/12/18     . ?This may not apply to medications prescribed by your physician. ? (Please note below the special instructions with medications to take the morning of your procedure.)      4. We recommend you do not drink any alcoholic beverages for 24 hours before and after your surgery.      5. Contact your surgeons office for instructions on the following medications: non-steroidal anti-inflammatory drugs (i.e. Advil, Aleve), vitamins, and supplements. (Some surgeons will want you to stop these medications prior to surgery and others  may allow you to take them)   **If you are currently taking Plavix, Coumadin, Aspirin and/or other blood-thinning agents, contact your surgeon for instructions.** Your surgeon will partner with the physician prescribing  these medications to determine if it is safe to stop or if you need to continue taking.  Please do not stop  taking these medications without instructions from your surgeon      6. Wear comfortable clothes.  Wear glasses instead of contacts.  Do not bring any money or jewelry. Please bring picture ID, insurance card, and any prearranged co-payment or hospital payment.  Do not wear make-up, particularly mascara the morning of  your surgery.  Do not wear nail polish, particularly if you are having foot /hand surgery.  Wear your hair loose or down, no ponytails, buns, bobby pins or clips.  All body piercings must be removed.   Please shower with antibacterial soap for three consecutive days  before and on the morning of surgery, but do not apply any lotions, powders or deodorants afte r the shower on the day of surgery. Please use a fresh towels after each shower.  Please sleep in clean clothes and change bed linens the night before surgery.  Please do not shave for 48 hours prior to surgery. Shaving of the face is acceptable.      7. You should understand that if  you do not follow these instructions your surgery may be cancelled.  If your physical condition changes (I.e. fever, cold or flu) please contact your surgeon as soon as possible.      8. It is important that you be on time.  If a situation occurs where you may be late, please call [phone] (OR Holding Area) .      9. If you have any questions and or problems, please call [phone] (Pre-admission Testing).      10. Your surgery time may be subject to change.  You will receive a phone call the evening prior if your time changes.      11.  If having outpatient surgery, you must have someone to drive you here, stay with you during the duration of your stay, and to drive you home at time of discharge.      12.   In an effort to improve the efficiency, privacy, and safety for all of our Pre-op patients visitors are not allowed in the Holding area.  Once you arrive and are registered your family/visitors  will be asked to remain in the waiting room.  The Pre-op  staff will get you from the Surgical Waiting Area and will explain to you and your family/visitors that the Pre-op phase is beginning.  The staff will answer any questions and provide instructions  for tracking of the patient, by use of the existing tracking number and color-coded status board in the waiting room.  At this time the staff will also ask for your designated spokesperson information in the event that the physician or staff need to  provide an update or obtain any pertinent information. The designated spokesperson will be notified if the physician needs to speak to family during the pre-operative phase.  If at any time your family/visitors has questions or concerns they may approach  the volunteer desk in the waiting area for assistance.             Special Instructions:   1) Practice the incentive spirometer 20 breaths per day and bring with you to the hospital on the day of surgery.    2) Follow-up with Dr. Sampson Goon on Friday 02/07/18 at 3:15 pm at [place] A SIP OF WATER: gabapentin, omeprazole, oxybutynin, Lyrica.         I understand a pre-operative phone call will be made to verify my surgery time.  In the event that I am not available, I give permission for a message to be left on my answering service and/or with another person?  Yes             ___________________      __________   _________     (Signature of Patient)             (Witness)                 (Date and Time)

## 2018-02-05 NOTE — Interval H&P Note (Signed)
Telephone order received from Dr. MacDougall/Lisa for patient to discontinue PLETAL one week prior to surgery.  VM Message left for patient's niece Marlou Porch [phone]) with detailed instructions, also requesting call back to confirm receipt of message.      Marlou Porch returned call and verbalized understanding of PLETAL instructions.  She will have the patient discontinue Pletal tomorrow, one week prior to surgery, and will bring patient for follow-up with Dr. Sampson Goon as scheduled on Friday 02/07/18 at 3:15 pm.

## 2018-02-05 NOTE — Interval H&P Note (Signed)
Periop  Notes by Carmin Muskrat A at 02/05/18 1435                Author: Carmin Muskrat A  Service: --  Author Type: Registered Nurse       Filed: 02/05/18 1604  Date of Service: 02/05/18 1435  Status: Addendum          Editor: Carmin Muskrat A          Related Notes: Original Note by Carmin Muskrat A filed at 02/05/18 1435               Incentive Spirometer              Using the incentive spirometer helps expand the small air sacs of your lungs, helps you breathe deeply, and helps improve your lung function.   Use your incentive spirometer twice a day (10 breaths each time) prior to surgery.        How to Use Your Incentive Spirometer:   1.  Hold the incentive spirometer in an upright position.    2.  Breathe out as usual.    3.  Place the mouthpiece in your mouth and seal your lips tightly around it.    4.  Take a deep breath.  Breathe in slowly and as deeply as possible. Keep the blue flow rate guide between the arrows.    5.  Hold your breath as long as possible. Then exhale slowly and allow the piston to fall to the bottom of the column.    6.  Rest for a few seconds and repeat steps one through five at least 10 times.       PAT Tidal Volume_____1250-1500 _____  x____2_______  Date____7/3/19 _________      Margretta Sidle THE INCENTIVE SPIROMETER WITH YOU TO THE HOSPITAL ON THE DAY OF YOUR SURGERY.   Opportunity given to ask and answer questions as well as to observe return demonstration.      Patient signature_____________________________    Witness____________________

## 2018-02-07 ENCOUNTER — Ambulatory Visit
Admit: 2018-02-07 | Discharge: 2018-02-07 | Payer: MEDICARE | Attending: Cardiovascular Disease | Primary: Family Medicine

## 2018-02-07 ENCOUNTER — Ambulatory Visit: Attending: Cardiovascular Disease | Primary: Family Medicine

## 2018-02-07 DIAGNOSIS — Z01818 Encounter for other preprocedural examination: Secondary | ICD-10-CM

## 2018-02-07 NOTE — Telephone Encounter (Signed)
Spoke with Ms. Mohammad and gave her CPT and ICD10 codes.  She verbalized understanding.

## 2018-02-07 NOTE — Addendum Note (Signed)
Addended by: Tana Conch on: 02/10/2018 08:10 AM     Modules accepted: Level of Service

## 2018-02-07 NOTE — Telephone Encounter (Signed)
Patient's niece called to inquire about the cost of her aunt's surgery. I explained she needs the codes of the surgery, then she can call her insurance with that information. She would like a call back.

## 2018-02-07 NOTE — Progress Notes (Signed)
[facility]   [address], [place]                                          NEW PATIENT HPI/FOLLOW-UP      NAME:  Kimberly [name]   DOB:   08-01-1924   MRN:   [id number]   PCP:  Darlys Gales, MD    ??????    Subjective:     The patient is a 84 y.o. year old female,non-smoker with PMHx of HTN, dyslipidemia, memory loss, atypical chest pain, GERD, DJD with cane, insomnia, syncope and collapse, negative NST/echo 2015, recently discovered aggressive left breast CA. Here for preop cardiac clearance. Denies change in exercise tolerance, chest pain, edema, medication intolerance, palpitations, shortness of breath, PND/orthopnea wheezing, sputum, recurrent syncope, dizziness or light headedness. Doing satisfactorily.      Review of SystemsGeneral: Pt denies excessive weight gain or loss. Pt is not able to conduct ADL's. Here with Daughter.  Respiratory: Denies shortness of breath, DOE, wheezing or stridor.  Cardiovascular: Denies precordial pain, palpitations, edema or PND  Gastrointestinal: Denies poor appetite, indigestion, abdominal pain or blood in stool  Peripheral vascular: Denies claudication, leg cramps  Neuropsychiatric: Denies paresthesias,tingling,numbness,anxiety,depression,fatigue  Musculoskeletal: Denies pain,tenderness, soreness,swelling      Past Medical History:   Diagnosis Date   ??? Adverse effect of anesthesia     slow to wake   ??? Anginal pain ([place])    ??? Arthritis    ??? Cancer ([place]) 01/2018    breast   ??? Chronic pain     neuropathy   ??? Constipation    ??? GERD (gastroesophageal reflux disease)    ??? Hearing loss    ??? High cholesterol    ??? Hypertension    ??? Incontinence    ??? Joint pain    ??? Memory disorder    ??? Muscle pain    ??? Osteoporosis    ??? Ringing in the ears    ??? Snoring    ??? Visual disturbance      Patient Active Problem List    Diagnosis Date Noted   ??? Malignant neoplasm of upper-outer quadrant of left breast in female,  estrogen receptor negative ([place]) 01/28/2018   ??? Syncope and collapse 07/18/2016   ??? Headache disorder 07/18/2016   ??? 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 Memory loss 11/21/2014   ??? Acute bacterial conjunctivitis of right eye 11/21/2014   ??? SOB (shortness of breath) on exertion 06/21/2014   ??? Chest pain with normal angiography--~ 15 yrs ago MCV 06/21/2014   ??? Hypertension, essential, benign    ??? High cholesterol       Past Surgical History:   Procedure Laterality Date   ??? HX HYSTERECTOMY     ??? HX ORTHOPAEDIC      epidural pain mtg - pt unsure   ??? HX OTHER SURGICAL Right 10/15/2016    ligament correction right foot   ??? HX OTHER SURGICAL  11/26/2016    Pituitary tumor removal     Allergies   Allergen Reactions   ??? Pcn [Penicillins] Hives and Myalgia   ??? Sulfa (Sulfonamide Antibiotics) Unknown (comments)      Family History   Problem Relation Age of Onset   ??? Breast Cancer Mother 34   ??? No Known Problems Father    ??? Breast  Cancer Sister 22   ??? Breast Cancer Daughter 40   ??? Dementia Brother    ??? Dementia Sister       Social History     Socioeconomic History   ??? Marital status: WIDOWED     Spouse name: Not on file   ??? Number of children: Not on file   ??? Years of education: Not on file   ??? Highest education level: Not on file   Occupational History   ??? Not on file   Social Needs   ??? Financial resource strain: Not on file   ??? Food insecurity:     Worry: Not on file     Inability: Not on file   ??? Transportation needs:     Medical: Not on file     Non-medical: Not on file   Tobacco Use   ??? Smoking status: Never Smoker   ??? Smokeless tobacco: Never Used   Substance and Sexual Activity   ??? Alcohol use: No   ??? Drug use: No   ??? Sexual activity: Never   Lifestyle   ??? Physical activity:     Days per week: Not on file     Minutes per session: Not on file   ??? Stress: Not on file   Relationships   ??? Social connections:     Talks on phone: Not on file     Gets together: Not on file     Attends religious service: Not on file      Active member of club or organization: Not on file     Attends meetings of clubs or organizations: Not on file     Relationship status: Not on file   ??? Intimate partner violence:     Fear of current or ex partner: Not on file     Emotionally abused: Not on file     Physically abused: Not on file     Forced sexual activity: Not on file   Other Topics Concern   ??? Not on file   Social History Narrative   ??? Not on file      Current Outpatient Medications   Medication Sig   ??? gabapentin (NEURONTIN) 300 mg capsule Take 300 mg by mouth three (3) times daily.   ??? acetaminophen (TYLENOL) 500 mg tablet Take 1,000 mg by mouth every six (6) hours as needed for Pain.   ??? diphenhydrAMINE-acetaminophen (ACETAMINOPHEN PM) 25-500 mg tab Take 2 Tabs by mouth nightly.   ??? alendronate (FOSAMAX) 70 mg tablet TAKE 1 TABLET BY MOUTH EVERY 7 DAYS   ??? ezetimibe (ZETIA) 10 mg tablet TAKE 1 TABLET BY MOUTH DAILY   ??? oxybutynin (DITROPAN) 5 mg tablet TAKE 1 TABLET BY MOUTH TWICE DAILY   ??? cilostazol (PLETAL) 100 mg tablet TAKE 1 TABLET BY MOUTH TWICE DAILY ON EMPTY STOMACH   ??? pregabalin (LYRICA) 50 mg capsule Take 1 Cap by mouth two (2) times a day. Max Daily Amount: 100 mg.   ??? omeprazole (PRILOSEC) 40 mg capsule TAKE 1 CAPSULE BY MOUTH DAILY   ??? estradiol (VAGIFEM) 10 mcg tab vaginal tablet INSERT 1 T VAGINALLY TWICE PER WEEK   ??? verapamil ER (CALAN-SR) 180 mg CR tablet TAKE 1 TABLET BY MOUTH EVERY NIGHT   ??? isosorbide dinitrate (ISORDIL) 10 mg tablet TAKE 1 TABLET BY MOUTH TWICE DAILY   ??? lisinopril (PRINIVIL) 10 mg tablet Take 1 Tab by mouth daily.   ??? polyethylene glycol (MIRALAX) 17 gram/dose powder TAKE  1 CAPFUL PO ONCE A DAY   ??? aspirin 81 mg chewable tablet Take 81 mg by mouth daily.     No current facility-administered medications for this visit.         I have reviewed the nurses notes, vitals, problem list, allergy list, medical history, family medical, social history and medications.        Objective:     Physical Exam:      Vitals:    02/07/18 1501   BP: 152/90   Pulse: 72   Resp: 16   SpO2: 93%   Weight: 132 lb (59.9 kg)   Height: '5\' 3"'  (1.6 m)    Body mass index is 23.55 kg/m??.    General: Well developed, in no acute distress.  HEENT: No carotid bruits, no JVD, trach is midline.   Heart: ??Normal S1/S2 negative S3 or S4. Regular, no murmur, gallop or rub.??  Respiratory: Clear bilaterally, no wheezing or rales  Abdomen:?? ??Soft, non-tender, bowel sounds are active.??  Extremities:  No edema, normal cap refill, no cyanosis.  Neuro: A&Ox3, speech clear, gait stable.   Skin: Skin color is normal. No rashes or lesions. No diaphoresis.  Vascular: 2+ pulses symmetric in all extremities        Data Review:       Cardiographics:    EKG: NSR,LAE,IVCD right sided, possible anterolateral ischemia/strain    Cardiology Labs:    Results for orders placed or performed during the hospital encounter of 05/27/17   EKG, 12 LEAD, INITIAL   Result Value Ref Range    Ventricular Rate 70 BPM    Atrial Rate 70 BPM    P-R Interval 134 ms    QRS Duration 98 ms    Q-T Interval 418 ms    QTC Calculation (Bezet) 451 ms    Calculated P Axis 43 degrees    Calculated R Axis -4 degrees    Calculated T Axis 130 degrees    Diagnosis       Normal sinus rhythm  T wave abnormality, consider lateral ischemia  When compared with ECG of 10-May-2013 07:38,  T wave inversion more evident in Anterolateral leads  Confirmed by Olegario Shearer [id number]) on 05/27/2017 8:17:13 AM         Lab Results   Component Value Date/Time    Cholesterol, total 133 12/17/2016 12:36 PM    HDL Cholesterol 66 12/17/2016 12:36 PM    LDL, calculated 53 12/17/2016 12:36 PM    Triglyceride 71 12/17/2016 12:36 PM       Lab Results   Component Value Date/Time    Sodium 139 02/05/2018 03:11 PM    Potassium 4.3 02/05/2018 03:11 PM    Chloride 109 (H) 02/05/2018 03:11 PM    CO2 25 02/05/2018 03:11 PM    Anion gap 5 02/05/2018 03:11 PM    Glucose 94 02/05/2018 03:11 PM    BUN 18 02/05/2018 03:11 PM     Creatinine 0.82 02/05/2018 03:11 PM    BUN/Creatinine ratio 22 (H) 02/05/2018 03:11 PM    GFR est AA >60 02/05/2018 03:11 PM    GFR est non-AA >60 02/05/2018 03:11 PM    Calcium 9.1 02/05/2018 03:11 PM    Bilirubin, total 0.2 02/05/2018 03:11 PM    AST (SGOT) 19 02/05/2018 03:11 PM    Alk. phosphatase 84 02/05/2018 03:11 PM    Protein, total 7.1 02/05/2018 03:11 PM    Albumin 3.1 (L) 02/05/2018 03:11 PM    Globulin  4.0 02/05/2018 03:11 PM    A-G Ratio 0.8 (L) 02/05/2018 03:11 PM    ALT (SGPT) 25 02/05/2018 03:11 PM          Assessment:       ICD-10-CM ICD-9-CM    1. Preoperative cardiac clearance Z01.818 V72.84 NUCLEAR CARDIAC STRESS TEST      ECHO ADULT COMPLETE   2. Hypertension, essential, benign I10 401.1 AMB POC EKG ROUTINE W/ 12 LEADS, INTER & REP      NUCLEAR CARDIAC STRESS TEST      ECHO ADULT COMPLETE   3. Syncope and collapse R55 780.2 AMB POC EKG ROUTINE W/ 12 LEADS, INTER & REP      NUCLEAR CARDIAC STRESS TEST      ECHO ADULT COMPLETE   4. Memory loss R41.3 780.93    5. Chest pain with normal angiography--~ 15 yrs ago MCV R07.9 786.50    6. High cholesterol E78.00 272.0 NUCLEAR CARDIAC STRESS TEST   7. Primary insomnia F51.01 307.42    8. Malignant neoplasm of upper-outer quadrant of left breast in female, estrogen receptor negative ([place]) C50.412 174.4     Z17.1 V86.1          Discussion: Patient presents at this time stable from a cardiac perspective. Here for cardiac clearance prior to scheduled lumpectomy bx next Thursday for breast CA. Is asymptomatic but activity limited. Given risk factors, will schedule f/u Lexiscan NST and echo to exclude occult CAD and structural changes prior to clearance. EKG abnormal suggesting anterolateral ischemia/strain.       Plan:     1.Continue same meds. Lipid profile and labs followed by PCP.    2.Encouraged to exercise to tolerance and follow low fat, low cholesterol, low sodium predominantly Plant-based (consider Mediterranean) diet. Call  with questions or concerns. Will follow up any test results by phone and/or f/u here in office if needed.Marland Kitchen      3.Follow up: 1 year or sooner if needed.    I have discussed the diagnosis with the patient and the intended plan as seen in the above orders.  The patient has received an after-visit summary and questions were answered concerning future plans.  I have discussed any concerning medication side effects and warnings with the patient as well.    Lawerance Bach, MD  02/07/2018

## 2018-02-07 NOTE — Progress Notes (Signed)
[facility]   [address] [place]                                          NEW PATIENT HPI/FOLLOW-UP      NAME:  Kimberly [name]   DOB:   [birth year]   MRN:   [id number]   PCP:  Darlys Gales, MD    ??????    Subjective:     The patient is a 84 y.o. year old female,non-smoker with PMHx of HTN, dyslipidemia, memory loss, atypical chest pain, GERD, DJD with cane, insomnia, syncope and collapse, negative NST/echo 2015, recently discovered aggressive left breast CA. Here for preop cardiac clearance. Denies change in exercise tolerance, chest pain, edema, medication intolerance, palpitations, shortness of breath, PND/orthopnea wheezing, sputum, recurrent syncope, dizziness or light headedness. Doing satisfactorily.      Review of Systems  General: Pt denies excessive weight gain or loss. Pt is not able to conduct ADL's. Here with Daughter.  Respiratory: Denies shortness of breath, DOE, wheezing or stridor.  Cardiovascular: Denies precordial pain, palpitations, edema or PND  Gastrointestinal: Denies poor appetite, indigestion, abdominal pain or blood in stool  Peripheral vascular: Denies claudication, leg cramps  Neuropsychiatric: Denies paresthesias,tingling,numbness,anxiety,depression,fatigue  Musculoskeletal: Denies pain,tenderness, soreness,swelling      Past Medical History:   Diagnosis Date   ??? Adverse effect of anesthesia     slow to wake   ??? Anginal pain ([place])    ??? Arthritis    ??? Cancer ([place]) 01/2018    breast   ??? Chronic pain     neuropathy   ??? Constipation    ??? GERD (gastroesophageal reflux disease)    ??? Hearing loss    ??? High cholesterol    ??? Hypertension    ??? Incontinence    ??? Joint pain    ??? Memory disorder    ??? Muscle pain    ??? Osteoporosis    ??? Ringing in the ears    ??? Snoring    ??? Visual disturbance      Patient Active Problem List    Diagnosis Date Noted   ??? Malignant neoplasm of upper-outer quadrant of left breast in female, estrogen receptor negative ([place])  01/28/2018   ??? Syncope and collapse 07/18/2016   ??? Headache disorder 07/18/2016   ??? 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 Memory loss 11/21/2014   ??? Acute bacterial conjunctivitis of right eye 11/21/2014   ??? SOB (shortness of breath) on exertion 06/21/2014   ??? Chest pain with normal angiography--~ 15 yrs ago MCV 06/21/2014   ??? Hypertension, essential, benign    ??? High cholesterol       Past Surgical History:   Procedure Laterality Date   ??? HX HYSTERECTOMY     ??? HX ORTHOPAEDIC      epidural pain mtg - pt unsure   ??? HX OTHER SURGICAL Right 10/15/2016    ligament correction right foot   ??? HX OTHER SURGICAL  11/26/2016    Pituitary tumor removal     Allergies   Allergen Reactions   ??? Pcn [Penicillins] Hives and Myalgia   ??? Sulfa (Sulfonamide Antibiotics) Unknown (comments)      Family History   Problem Relation Age of Onset   ??? Breast Cancer Mother 16   ??? No Known Problems Father    ???  Breast Cancer Sister 59   ??? Breast Cancer Daughter 76   ??? Dementia Brother    ??? Dementia Sister       Social History     Socioeconomic History   ??? Marital status: WIDOWED     Spouse name: Not on file   ??? Number of children: Not on file   ??? Years of education: Not on file   ??? Highest education level: Not on file   Occupational History   ??? Not on file   Social Needs   ??? Financial resource strain: Not on file   ??? Food insecurity:     Worry: Not on file     Inability: Not on file   ??? Transportation needs:     Medical: Not on file     Non-medical: Not on file   Tobacco Use   ??? Smoking status: Never Smoker   ??? Smokeless tobacco: Never Used   Substance and Sexual Activity   ??? Alcohol use: No   ??? Drug use: No   ??? Sexual activity: Never   Lifestyle   ??? Physical activity:     Days per week: Not on file     Minutes per session: Not on file   ??? Stress: Not on file   Relationships   ??? Social connections:     Talks on phone: Not on file     Gets together: Not on file     Attends religious service: Not on file     Active member of club or organization: Not on  file     Attends meetings of clubs or organizations: Not on file     Relationship status: Not on file   ??? Intimate partner violence:     Fear of current or ex partner: Not on file     Emotionally abused: Not on file     Physically abused: Not on file     Forced sexual activity: Not on file   Other Topics Concern   ??? Not on file   Social History Narrative   ??? Not on file      Current Outpatient Medications   Medication Sig   ??? gabapentin (NEURONTIN) 300 mg capsule Take 300 mg by mouth three (3) times daily.   ??? acetaminophen (TYLENOL) 500 mg tablet Take 1,000 mg by mouth every six (6) hours as needed for Pain.   ??? diphenhydrAMINE-acetaminophen (ACETAMINOPHEN PM) 25-500 mg tab Take 2 Tabs by mouth nightly.   ??? alendronate (FOSAMAX) 70 mg tablet TAKE 1 TABLET BY MOUTH EVERY 7 DAYS   ??? ezetimibe (ZETIA) 10 mg tablet TAKE 1 TABLET BY MOUTH DAILY   ??? oxybutynin (DITROPAN) 5 mg tablet TAKE 1 TABLET BY MOUTH TWICE DAILY   ??? cilostazol (PLETAL) 100 mg tablet TAKE 1 TABLET BY MOUTH TWICE DAILY ON EMPTY STOMACH   ??? pregabalin (LYRICA) 50 mg capsule Take 1 Cap by mouth two (2) times a day. Max Daily Amount: 100 mg.   ??? omeprazole (PRILOSEC) 40 mg capsule TAKE 1 CAPSULE BY MOUTH DAILY   ??? estradiol (VAGIFEM) 10 mcg tab vaginal tablet INSERT 1 T VAGINALLY TWICE PER WEEK   ??? verapamil ER (CALAN-SR) 180 mg CR tablet TAKE 1 TABLET BY MOUTH EVERY NIGHT   ??? isosorbide dinitrate (ISORDIL) 10 mg tablet TAKE 1 TABLET BY MOUTH TWICE DAILY   ??? lisinopril (PRINIVIL) 10 mg tablet Take 1 Tab by mouth daily.   ??? polyethylene glycol (MIRALAX) 17 gram/dose powder  TAKE 1 CAPFUL PO ONCE A DAY   ??? aspirin 81 mg chewable tablet Take 81 mg by mouth daily.     No current facility-administered medications for this visit.         I have reviewed the nurses notes, vitals, problem list, allergy list, medical history, family medical, social history and medications.        Objective:     Physical Exam:     Vitals:    02/07/18 1501   BP: 152/90   Pulse: 72    Resp: 16   SpO2: 93%   Weight: 132 lb (59.9 kg)   Height: 5' 3"  (1.6 m)    Body mass index is 23.55 kg/m??.    General: Well developed, in no acute distress.  HEENT: No carotid bruits, no JVD, trach is midline.   Heart: ??Normal S1/S2 negative S3 or S4. Regular, no murmur, gallop or rub.??  Respiratory: Clear bilaterally, no wheezing or rales  Abdomen:?? ??Soft, non-tender, bowel sounds are active.??  Extremities:  No edema, normal cap refill, no cyanosis.  Neuro: A&Ox3, speech clear, gait stable.   Skin: Skin color is normal. No rashes or lesions. No diaphoresis.  Vascular: 2+ pulses symmetric in all extremities        Data Review:       Cardiographics:    EKG: NSR,LAE,IVCD right sided, possible anterolateral ischemia/strain    Cardiology Labs:    Results for orders placed or performed during the hospital encounter of 05/27/17   EKG, 12 LEAD, INITIAL   Result Value Ref Range    Ventricular Rate 70 BPM    Atrial Rate 70 BPM    P-R Interval 134 ms    QRS Duration 98 ms    Q-T Interval 418 ms    QTC Calculation (Bezet) 451 ms    Calculated P Axis 43 degrees    Calculated R Axis -4 degrees    Calculated T Axis 130 degrees    Diagnosis       Normal sinus rhythm  T wave abnormality, consider lateral ischemia  When compared with ECG of 10-May-2013 07:38,  T wave inversion more evident in Anterolateral leads  Confirmed by Olegario Shearer [id number]) on 05/27/2017 8:17:13 AM         Lab Results   Component Value Date/Time    Cholesterol, total 133 12/17/2016 12:36 PM    HDL Cholesterol 66 12/17/2016 12:36 PM    LDL, calculated 53 12/17/2016 12:36 PM    Triglyceride 71 12/17/2016 12:36 PM       Lab Results   Component Value Date/Time    Sodium 139 02/05/2018 03:11 PM    Potassium 4.3 02/05/2018 03:11 PM    Chloride 109 (H) 02/05/2018 03:11 PM    CO2 25 02/05/2018 03:11 PM    Anion gap 5 02/05/2018 03:11 PM    Glucose 94 02/05/2018 03:11 PM    BUN 18 02/05/2018 03:11 PM    Creatinine 0.82 02/05/2018 03:11 PM    BUN/Creatinine ratio  22 (H) 02/05/2018 03:11 PM    GFR est AA >60 02/05/2018 03:11 PM    GFR est non-AA >60 02/05/2018 03:11 PM    Calcium 9.1 02/05/2018 03:11 PM    Bilirubin, total 0.2 02/05/2018 03:11 PM    AST (SGOT) 19 02/05/2018 03:11 PM    Alk. phosphatase 84 02/05/2018 03:11 PM    Protein, total 7.1 02/05/2018 03:11 PM    Albumin 3.1 (L) 02/05/2018 03:11 PM  Globulin 4.0 02/05/2018 03:11 PM    A-G Ratio 0.8 (L) 02/05/2018 03:11 PM    ALT (SGPT) 25 02/05/2018 03:11 PM          Assessment:       ICD-10-CM ICD-9-CM    1. Preoperative cardiac clearance Z01.818 V72.84 NUCLEAR CARDIAC STRESS TEST      ECHO ADULT COMPLETE   2. Hypertension, essential, benign I10 401.1 AMB POC EKG ROUTINE W/ 12 LEADS, INTER & REP      NUCLEAR CARDIAC STRESS TEST      ECHO ADULT COMPLETE   3. Syncope and collapse R55 780.2 AMB POC EKG ROUTINE W/ 12 LEADS, INTER & REP      NUCLEAR CARDIAC STRESS TEST      ECHO ADULT COMPLETE   4. Memory loss R41.3 780.93    5. Chest pain with normal angiography--~ 15 yrs ago MCV R07.9 786.50    6. High cholesterol E78.00 272.0 NUCLEAR CARDIAC STRESS TEST   7. Primary insomnia F51.01 307.42    8. Malignant neoplasm of upper-outer quadrant of left breast in female, estrogen receptor negative ([place]) C50.412 174.4     Z17.1 V86.1          Discussion: Patient presents at this time stable from a cardiac perspective. Here for cardiac clearance prior to scheduled lumpectomy bx next Thursday for breast CA. Is asymptomatic but activity limited. Given risk factors, will schedule f/u Lexiscan NST and echo to exclude occult CAD and structural changes prior to clearance. EKG abnormal suggesting anterolateral ischemia/strain.       Plan:     1.Continue same meds. Lipid profile and labs followed by PCP.    2.Encouraged to exercise to tolerance and follow low fat, low cholesterol, low sodium predominantly Plant-based (consider Mediterranean) diet. Call with questions or concerns. Will follow up any test results by phone and/or f/u here  in office if needed.Marland Kitchen      3.Follow up: 1 year or sooner if needed.    I have discussed the diagnosis with the patient and the intended plan as seen in the above orders.  The patient has received an after-visit summary and questions were answered concerning future plans.  I have discussed any concerning medication side effects and warnings with the patient as well.    Lawerance Bach, MD  02/07/2018

## 2018-02-07 NOTE — Addendum Note (Signed)
Addendum  Note by Call, Colletta [place] at 02/07/18 1515                Author: Call, Colletta [place]  Service: --  Author Type: --       Filed: 02/10/18 0810  Encounter Date: 02/07/2018  Status: Signed          Editor: Call, Colletta [place]          Addended by: Tana Conch on: 02/10/2018 08:10 AM    Modules accepted: Level of Service

## 2018-02-10 MED ORDER — OXYBUTYNIN CHLORIDE 5 MG TAB
5 mg | ORAL_TABLET | ORAL | 0 refills | Status: DC
Start: 2018-02-10 — End: 2018-05-16

## 2018-02-10 NOTE — Telephone Encounter (Signed)
Spoke with Ms. Kimberly [name] who is listed on [employer]IP[employer] and changed surgery 03/03/2018 in [facility] at 2:30pm with 11:30am arrival time per her request.  Mailed her updated surgical instructions today.

## 2018-02-10 NOTE — Telephone Encounter (Signed)
Pt caregiver called to inform Dr Pt took 81 mg asprin today ,she is having surg tjis wk is this ok? Please call Ms Joya Gaskins the caregiver [phone]

## 2018-02-11 ENCOUNTER — Ambulatory Visit: Payer: MEDICARE | Primary: Family Medicine

## 2018-02-11 ENCOUNTER — Ambulatory Visit: Admit: 2018-02-11 | Discharge: 2018-02-11 | Payer: MEDICARE | Attending: Surgery | Primary: Family Medicine

## 2018-02-11 ENCOUNTER — Ambulatory Visit: Attending: Surgery | Primary: Family Medicine

## 2018-02-11 DIAGNOSIS — C50412 Malignant neoplasm of upper-outer quadrant of left female breast: Secondary | ICD-10-CM

## 2018-02-11 NOTE — Progress Notes (Signed)
HISTORY OF PRESENT ILLNESS  Kimberly [name] is a 84 y.o. female.  HPI NEW patient consult referred by  Donnel Saxon, MD for newly diagnosed LEFT breast cancer. She is scheduled for a mastectomy with Dr Silvana Newness and is here with her niece/POA, [name] to ask some questions before her surgery.  Her cancer was identified on screening mammogram.  She is scheduled for mastectomy 03/03/2018.  She will have a cardiac work up before surgery.    01/2018 LEFT breast cancer, grade 3, triple negative    OB History   Obstetric Comments   Menarche 16, LMP age 4, # of children 1, age of 44st delivery 19, Hysterectomy/oophorectomy yes/no, Breast bx no, history of breast feeding yes, BCP yes, Hormone therapy yes     Family History:  Mother, breast cancer, age unknown, deceased  Daughter, breast cancer, age 60, deceased  Sister, breast cancer, age 79, deceased      MAM Results (most recent):  Results from Hospital Encounter encounter on 01/21/18   MAM POST BX IMAGING LT INCL CAD    Narrative STUDY:  ULTRASOUND-GUIDED CORE NEEDLE BIOPSY OF THE LEFT BREAST    INDICATION: Mass    PROCEDURE:  The risks and benefits of the procedure were explained to the patient the  patient's niece, questions were answered, and informed consent was obtained.    The upper outer quadrant mass at 1-2 o'clock in the left breast was localized  with ultrasound.  The breast was prepped in the usual sterile manner.  Following  the administration of a local anesthetic and dermatotomy, and using ultrasound  guidance,  12 gauge vacuum-assisted Celero needle was advanced to the lesion,  and 3 cores were obtained. Pre and post-fire images confirmed accurate needle  trajectory.   A metallic clip was placed at the biopsy site.  Post-biopsy  digital mammogram confirms the presence of the clip at the intended biopsy site.   The patient tolerated the procedure well without complication. Final pathology  is pending.      Impression IMPRESSION:    Successful left breast ultrasound-guided biopsy.  Further recommendations will  be based on final pathology results.             ROS    Physical Exam   Pulmonary/Chest: Right breast exhibits no mass, no nipple discharge, no skin change and no tenderness. Left breast exhibits mass (3 cm hard oval mass UOQ). Left breast exhibits no nipple discharge, no skin change and no tenderness. Breasts are symmetrical.       Lymphadenopathy:     She has no cervical adenopathy.     She has no axillary adenopathy.        Right: No supraclavicular adenopathy present.        Left: No supraclavicular adenopathy present.       ASSESSMENT and PLAN    ICD-10-CM ICD-9-CM    1. Malignant neoplasm of upper-outer quadrant of left breast in female, estrogen receptor negative (HCC) C50.412 174.4     Z17.1 V86.1        40 minutes were spent face-to-face with the patient and her niece during this encounter and >50% of that time was spent on counseling and coordination of care.    1. Her tumor is ER negative.  I explained that elderly patients who are ER positive can sometimes be treated successfully with medication only (AI), but that her cancer does not respond to that medication and therefore surgery is the best option for her.  2. She is comfortable with a mastectomy, and then can hopefully avoid radiation.  If she has lymph node metastases radiation would be recommended.  Her lymph nodes are clinically negative.  3. She understands that chemotherapy is given to decrease recurrence, but that chemotherapy would be challenging for her and therefore it's unlikely it would be recommended.  4. She had a cardiac work up before surgery. "Given risk factors, will schedule f/u Lexiscan NST and echo to exclude occult CAD and structural changes prior to clearance."  Discussed the possibility of surgery with sedation and possible lumpectomy instead of mastectomy if she cannot tolerate general anesthesia.  Dr Silvana Newness will make that decision.     Patient and niece asked all their questions.

## 2018-02-11 NOTE — Addendum Note (Signed)
Addended by: Willaim Sheng A on: 02/12/2018 04:05 PM     Modules accepted: Level of Service

## 2018-02-11 NOTE — Addendum Note (Signed)
Addendum  Note by Willaim Sheng A at 02/11/18 1210                Author: Willaim Sheng A  Service: --  Author Type: HIM PROVIDER       Filed: 02/12/18 1605  Encounter Date: 02/11/2018  Status: Signed          Editor: Schweitzer, Domingo Cocking (HIM PROVIDER)          Addended by: Willaim Sheng A on: 02/12/2018 04:05 PM    Modules accepted: Level of Service

## 2018-02-11 NOTE — Progress Notes (Addendum)
Progress Notes by Trisha Mangle, MD at 02/11/18 1210                Author: Trisha Mangle, MD  Service: --  Author Type: Physician       Filed: 02/12/18 1539  Encounter Date: 02/11/2018  Status: Signed          Editor: Trisha Mangle, MD (Physician)               HISTORY OF PRESENT ILLNESS   Kimberly [name] is a 84 y.o.  female.   HPI NEW patient consult referred by  Donnel Saxon, MD for newly diagnosed LEFT breast cancer. She is scheduled for a mastectomy  with Dr Silvana Newness and is here with her niece/POA, Kimberly [name] to ask some questions before her surgery.  Her cancer was identified on screening mammogram.   She is scheduled for mastectomy 03/03/2018.  She will have a cardiac work up before surgery.      01/2018 LEFT breast cancer, grade 3, triple negative        OB History       Obstetric Comments       Menarche 77, LMP age 82, # of children 1, age of 5st delivery 53, Hysterectomy/oophorectomy yes/no, Breast bx no, history of breast feeding yes, BCP  yes, Hormone therapy yes        Family History:   Mother, breast cancer, age unknown, deceased   Daughter, breast cancer, age 73, deceased   Sister, breast cancer, age 58, deceased         MAM Results (most recent):     Results from Hospital Encounter encounter on 01/21/18     MAM POST BX IMAGING LT INCL CAD           Narrative  STUDY:  ULTRASOUND-GUIDED CORE NEEDLE BIOPSY OF THE LEFT BREAST      INDICATION: Mass      PROCEDURE:   The risks and benefits of the procedure were explained to the patient the   patient's niece, questions were answered, and informed consent was obtained.      The upper outer quadrant mass at 1-2 o'clock in the left breast was localized   with ultrasound.  The breast was prepped in the usual sterile manner.  Following   the administration of a local anesthetic and dermatotomy, and using ultrasound   guidance,  12 gauge vacuum-assisted Celero needle was advanced to the lesion,   and 3 cores were obtained. Pre and post-fire images  confirmed accurate needle   trajectory.   A metallic clip was placed at the biopsy site.  Post-biopsy   digital mammogram confirms the presence of the clip at the intended biopsy site.    The patient tolerated the procedure well without complication. Final pathology   is pending.              Impression  IMPRESSION:    Successful left breast ultrasound-guided biopsy.  Further recommendations will   be based on final pathology results.                    ROS      Physical Exam    Pulmonary/Chest: Right breast exhibits no mass, no nipple discharge, no skin change and no tenderness. Left breast exhibits mass  (3 cm hard oval mass UOQ). Left breast exhibits no nipple discharge, no skin change and no tenderness. Breasts are symmetrical.         Lymphadenopathy:  She has no cervical adenopathy.     She has no axillary adenopathy.         Right: No supraclavicular adenopathy present.        Left: No supraclavicular adenopathy present.          ASSESSMENT and PLAN             ICD-10-CM  ICD-9-CM             1.  Malignant neoplasm of upper-outer quadrant of left breast in female, estrogen receptor negative (HCC)  C50.412  174.4              Z17.1  V86.1             40 minutes were spent face-to-face with the patient and her niece during this encounter and >50% of that time was spent on counseling and coordination of care.     1. Her tumor is ER negative.  I explained that elderly patients who are ER positive can sometimes be treated successfully with medication only (AI), but that her cancer does not respond to that medication and therefore surgery is the best option for her.   2. She is comfortable with a mastectomy, and then can hopefully avoid radiation.  If she has lymph node metastases radiation would be recommended.  Her lymph nodes are clinically negative.   3. She understands that chemotherapy is given to decrease recurrence, but that chemotherapy would be challenging for her and therefore it's unlikely it would  be recommended.   4. She had a cardiac work up before surgery. "Given risk factors, will schedule f/u Lexiscan NST and echo to  exclude occult CAD and structural changes prior to clearance."   Discussed the possibility of surgery with sedation and possible lumpectomy instead of mastectomy if she cannot tolerate general anesthesia.   Dr Silvana Newness will make that decision.      Patient and niece asked all their questions.

## 2018-02-13 ENCOUNTER — Ambulatory Visit: Payer: MEDICARE | Primary: Family Medicine

## 2018-02-18 ENCOUNTER — Encounter: Attending: Surgery | Primary: Family Medicine

## 2018-02-19 ENCOUNTER — Inpatient Hospital Stay: Admit: 2018-02-19 | Payer: MEDICARE | Attending: Cardiovascular Disease | Primary: Family Medicine

## 2018-02-19 DIAGNOSIS — Z01818 Encounter for other preprocedural examination: Secondary | ICD-10-CM

## 2018-02-19 LAB — NM STRESS TEST WITH MYOCARDIAL PERFUSION
Baseline Diastolic BP: 83 mmHg
Baseline HR: 69 {beats}/min
Baseline Systolic BP: 183 mmHg
Left Ventricular Ejection Fraction: 65
Stress Diastolic BP: 83 mmHg
Stress Estimated Workload: 1 METS
Stress Peak HR: 101 {beats}/min
Stress Percent HR Achieved: 79 %
Stress Rate Pressure Product: 18483 BPM*mmHg
Stress ST Depression: 0 mm
Stress ST Elevation: 0 mm
Stress Systolic BP: 183 mmHg
Stress Target HR: 128 {beats}/min

## 2018-02-19 LAB — NUCLEAR CARDIAC STRESS TEST
Baseline Diastolic BP: 83 mmHg
Baseline HR: 69 {beats}/min
Baseline Systolic BP: 183 mmHg
Stress Diastolic BP: 83 mmHg
Stress Estimated Workload: 1 METS
Stress Peak HR: 101 {beats}/min
Stress Percent HR Achieved: 79 %
Stress Rate Pressure Product: 18483 BPM*mmHg
Stress ST Depression: 0 mm
Stress ST Elevation: 0 mm
Stress Systolic BP: 183 mmHg
Stress Target HR: 128 {beats}/min

## 2018-02-19 MED ORDER — SODIUM CHLORIDE 0.9 % IJ SYRG
INTRAMUSCULAR | Status: DC | PRN
Start: 2018-02-19 — End: 2018-02-23
  Administered 2018-02-19: 14:00:00 via INTRAVENOUS

## 2018-02-19 MED ORDER — TECHNETIUM TC 99M SESTAMIBI - CARDIOLITE
Freq: Once | Status: AC
Start: 2018-02-19 — End: 2018-02-19
  Administered 2018-02-19: 12:00:00 via INTRAVENOUS

## 2018-02-19 MED ORDER — REGADENOSON 0.4 MG/5 ML IV SYRINGE
0.4 mg/5 mL | INTRAVENOUS | Status: AC
Start: 2018-02-19 — End: ?

## 2018-02-19 MED ORDER — REGADENOSON 0.4 MG/5 ML IV SYRINGE
0.4 mg/5 mL | Freq: Once | INTRAVENOUS | Status: AC
Start: 2018-02-19 — End: 2018-02-19
  Administered 2018-02-19: 14:00:00 via INTRAVENOUS

## 2018-02-19 MED ORDER — TECHNETIUM TC 99M SESTAMIBI - CARDIOLITE
Freq: Once | Status: AC
Start: 2018-02-19 — End: 2018-02-19
  Administered 2018-02-19: 15:00:00 via INTRAVENOUS

## 2018-02-19 MED ORDER — SODIUM CHLORIDE 0.9 % IJ SYRG
INTRAMUSCULAR | Status: AC
Start: 2018-02-19 — End: ?

## 2018-02-19 MED FILL — LEXISCAN 0.4 MG/5 ML INTRAVENOUS SYRINGE: 0.4 mg/5 mL | INTRAVENOUS | Qty: 5

## 2018-02-19 MED FILL — BD POSIFLUSH NORMAL SALINE 0.9 % INJECTION SYRINGE: INTRAMUSCULAR | Qty: 10

## 2018-02-19 NOTE — Telephone Encounter (Signed)
Left message for patient to return my call re [facility] genetic testing results were negative per Dr. Silvana Newness.

## 2018-02-20 NOTE — Telephone Encounter (Signed)
Yes patient wants to have a lumpectomy.

## 2018-02-20 NOTE — Telephone Encounter (Signed)
-----   Message from Lawerance Bach, MD sent at 02/20/2018 10:27 AM EDT -----  Regarding: STRESS NUKE  NORMAL!!!    Thx    B

## 2018-02-20 NOTE — Other (Signed)
Spoke with patient requested I call Niece Marlou Porch [phone]      Called Niece updated time and location of surgery.     verbalized understanding

## 2018-02-20 NOTE — Telephone Encounter (Signed)
Patient's caregiver called regarding patient's upcoming surgery. Please call.

## 2018-02-20 NOTE — Telephone Encounter (Signed)
Spoke with patient's niece Ms. Mohammad who is listed on her HIPPA  She stated patient has seen Dr. Gerilyn Nestle and Dr. Rodman Pickle for second opinion and now wants to have lumpectomy vs mastectomy.  She is also aware [facility] genetic testing is negative. Please advise.

## 2018-02-20 NOTE — Telephone Encounter (Signed)
I called and spoke with the patient. I informed her Dr. Sampson Goon wanted me to call and let you know that your Stress Test was normal. She acknowledged understanding and thanked me for the call.

## 2018-02-20 NOTE — Telephone Encounter (Signed)
Spoke with Kimberly [name] she is  aware per Dr. Silvana Newness ok to change surgery to Lt breast lumpectomy with ultrasound guidance.  She verbalized understanding.

## 2018-02-20 NOTE — Telephone Encounter (Signed)
Spoke with Shelly in [place] scheduling surgery for 03/03/2018 in [facility] was change to Left breast lumpectomy with ultrasound guidance.

## 2018-02-20 NOTE — Other (Signed)
[facility]  Ambulatory Surgery Unit  Pre-operative Instructions    Surgery/Procedure Date  03/03/18            Tentative Arrival Time 1100      1. On the day of your surgery/procedure, please report to the Ambulatory Surgery Unit Registration Desk and sign in at your designated time. The Ambulatory Surgery Unit is located in [facility] on the [place] side of the hospital across from the [place] building. Please have all of your health insurance cards and a photo ID.    2. You must have someone with you to drive you home, as you should not drive a car for 24 hours following anesthesia. Please make arrangements for a responsible adult friend or family member to stay with you for at least the first 24 hours after your surgery.    3. Do not have anything to eat or drink (including water, gum, mints, coffee, juice) after 11:59 PM  03/02/18. This may not apply to medications prescribed by your physician.  (Please note below the special instructions with medications to take the morning of surgery, if applicable.)    4. We recommend you do not drink any alcoholic beverages for 24 hours before and after your surgery.    5. Contact your surgeon???s office for instructions on the following medications: non-steroidal anti-inflammatory drugs (i.e. Advil, Aleve), vitamins, and supplements. (Some surgeon???s will want you to stop these medications prior to surgery and others may allow you to take them)   **If you are currently taking Plavix, Coumadin, Aspirin and/or other blood-thinning agents, contact your surgeon for instructions.** Your surgeon will partner with the physician prescribing these medications to determine if it is safe to stop or if you need to continue taking. Please do not stop taking these medications without instructions from your surgeon.    6. In an effort to help prevent surgical site infection, we ask that you shower with an anti-bacterial soap (i.e. Dial/Safeguard, or the soap  provided to you at your preadmission testing appointment) for 3 days prior to and on the morning of surgery, using a fresh towel after each shower. (Please begin this process with fresh bed linens.) Do not apply any lotions, powders, or deodorants after the shower on the day of your procedure. If applicable, please do not shave the operative site for 48 hours prior to surgery.     7. Wear comfortable clothes. Wear glasses instead of contacts. Do not bring any jewelry or money (other than copays or fees as instructed). Do not wear make-up, particularly mascara, the morning of your surgery. Do not wear nail polish, particularly if you are having foot /hand surgery. Wear your hair loose or down, no ponytails, buns, bobby pins or clips. All body piercings must be removed.      8. You should understand that if you do not follow these instructions your surgery may be cancelled. If your physical condition changes (i.e. fever, cold or flu) please contact your surgeon as soon as possible.    9. It is important that you be on time. If a situation occurs where you may be late, or if you have any questions or problems, please call [phone].    10. Your surgery time may be subject to change. You will receive a phone call the day prior to surgery to confirm your arrival time.    11. Pediatric patients: please bring a change of clothes, diapers, bottle/sippy cup, pacifier, etc.  Special Instructions:    Take all medications and inhalers, as prescribed, on the morning of surgery with a sip of water EXCEPT: n/a    I understand a pre-operative phone call will be made to verify my surgery time.  In the event that I am not available, I give permission for a message to be left on my answering service and/or with another person?      Yes           ___________________      ___________________      ________________  (Signature of Patient)          (Witness)                   (Date and Time)

## 2018-02-20 NOTE — Interval H&P Note (Signed)
Spoke with patient requested I call Niece Marlou Porch [phone]      Called Niece updated time and location of surgery.     verbalized understanding

## 2018-02-20 NOTE — Interval H&P Note (Signed)
Periop Notes  by Delorse Limber, RN at 02/20/18 1435                Author: Delorse Limber, RN  Service: --  Author Type: Registered Nurse       Filed: 02/20/18 1435  Date of Service: 02/20/18 1435  Status: Signed          Editor: Delorse Limber, RN (Registered Nurse)                    [facility]   Ambulatory Surgery Unit   Pre-operative Instructions      Surgery/Procedure Date  03/03/18            Tentative  Arrival Time 1100         1. On the day of your surgery/procedure, please report to the Ambulatory Surgery Unit Registration Desk and sign in at your designated time. The Ambulatory Surgery Unit is  located in [facility] on the [place] side of the hospital across from the [place] building. Please have all of your health insurance cards and a photo ID.      2. You must have someone with you to drive you home, as you should not drive a car for 24 hours following anesthesia. Please make arrangements for a responsible adult friend or family member to stay with you for at least the first 24 hours after your  surgery.      3. Do not have anything to eat or drink (including water, gum, mints, coffee, juice) after 11:59 PM   03/02/18. This may not apply to medications prescribed by your physician.   (Please note below the special instructions with medications to take the morning of surgery, if applicable.)      4. We recommend you do not drink any alcoholic beverages for 24 hours before and after your surgery.      5. Contact your surgeons office for instructions on the following medications: non-steroidal  anti-inflammatory drugs (i.e. Advil, Aleve), vitamins, and supplements. (Some surgeons will want you to stop these medications prior to surgery and others may allow you to take them)    **If you are currently taking Plavix, Coumadin, Aspirin and/or other blood-thinning agents, contact your surgeon for instructions.** Your surgeon will partner with the physician prescribing  these  medications to determine if it is safe to stop or if you need to continue taking. Please do not stop taking these medications without instructions from your surgeon.      6. In an effort to help prevent surgical site infection, we ask that you shower with an anti-bacterial soap (i.e. Dial/ Safeguard, or the soap provided to you at your preadmission testing appointment) for 3 days prior to and on the morning of surgery , using a fresh towel after each shower. (Please begin this process with fresh bed linens.) Do not apply any lotions, powders , or deodorants after the shower on the day of your procedure. If applicable, please do not shave the operative site for 48 hours prior to surgery.       7. Wear comfortable clothes. Wear glasses instead of contacts. Do not bring any jewelry or money  (other than copays or fees as instructed). Do not wear make-up, particularly mascara, the morning of your surgery. Do not wear nail polish, particularly if you are having foot /hand surgery.  Wear your hair loose or down, no ponytails, buns, bobby pins or clips. All body  piercings must be removed.        8. You should understand that if you do not follow these instructions your surgery may be cancelled. If your physical condition changes (i.e. fever, cold or flu) please contact your surgeon as soon as possible.      9. It is important that you be on time. If a situation occurs where you may be late, or if you have any questions or problems, please call [phone].      10. Your surgery time may be subject to change. You will receive a phone call the day prior to surgery to confirm your arrival time.      11. Pediatric patients: please bring a change of clothes, diapers, bottle/sippy cup, pacifier, etc.         Special Instructions:      Take all medications and inhalers, as prescribed, on the morning of surgery with a sip of water EXCEPT:  n/a      I understand a pre-operative phone call will be made to verify my surgery time.   In the event that I am not available, I give permission  for a message to be left on my answering service and/or with another person?      Yes                ___________________      ___________________      ________________   (Signature of Patient)          (Witness)                   (Date and Time)

## 2018-02-25 MED ORDER — OMEPRAZOLE 40 MG CAP, DELAYED RELEASE
40 mg | ORAL_CAPSULE | ORAL | 0 refills | Status: DC
Start: 2018-02-25 — End: 2018-03-14

## 2018-02-25 NOTE — Other (Signed)
Spoke with Romie Minus and then [place].  She will fax new order with correct consent now.  We will need to get signature dos, as Dr. Silvana Newness out this week.

## 2018-02-25 NOTE — Telephone Encounter (Signed)
Need cardiac clearance placed in [facility].          Thanks.

## 2018-02-25 NOTE — Interval H&P Note (Signed)
Spoke with Romie Minus and then Alicia.  She will fax new order with correct consent now.  We will need to get signature dos, as Dr. Silvana Newness out this week.

## 2018-03-03 ENCOUNTER — Ambulatory Visit: Payer: MEDICARE | Primary: Family Medicine

## 2018-03-03 MED ORDER — EZETIMIBE 10 MG TAB
10 mg | ORAL_TABLET | ORAL | 0 refills | Status: DC
Start: 2018-03-03 — End: 2018-06-06

## 2018-03-04 NOTE — Other (Signed)
Spoke with Kimberly [name] at Dr Roselyn Meier office blood work from 02/05/18 is ok for surgery, no need to repeat.

## 2018-03-04 NOTE — Other (Signed)
[facility]  Ambulatory Surgery Unit  Pre-operative Instructions    Surgery/Procedure Date 03/10/18           Tentative Arrival Time 1330      1. On the day of your surgery/procedure, please report to the Ambulatory Surgery Unit Registration Desk and sign in at your designated time. The Ambulatory Surgery Unit is located in [facility] on the [place] side of the hospital across from the [place] building. Please have all of your health insurance cards and a photo ID.    2. You must have someone with you to drive you home, as you should not drive a car for 24 hours following anesthesia. Please make arrangements for a responsible adult friend or family member to stay with you for at least the first 24 hours after your surgery.    3. Do not have anything to eat or drink (including water, gum, mints, coffee, juice) after 11:59 PM  03/09/18. This may not apply to medications prescribed by your physician.  (Please note below the special instructions with medications to take the morning of surgery, if applicable.)    4. We recommend you do not drink any alcoholic beverages for 24 hours before and after your surgery.    5. Contact your surgeon???s office for instructions on the following medications: non-steroidal anti-inflammatory drugs (i.e. Advil, Aleve), vitamins, and supplements. (Some surgeon???s will want you to stop these medications prior to surgery and others may allow you to take them)   **If you are currently taking Plavix, Coumadin, Aspirin and/or other blood-thinning agents, contact your surgeon for instructions.** Your surgeon will partner with the physician prescribing these medications to determine if it is safe to stop or if you need to continue taking. Please do not stop taking these medications without instructions from your surgeon.    6. In an effort to help prevent surgical site infection, we ask that you shower with an anti-bacterial soap (i.e. Dial/Safeguard, or the soap  provided to you at your preadmission testing appointment) for 3 days prior to and on the morning of surgery, using a fresh towel after each shower. (Please begin this process with fresh bed linens.) Do not apply any lotions, powders, or deodorants after the shower on the day of your procedure. If applicable, please do not shave the operative site for 48 hours prior to surgery.     7. Wear comfortable clothes. Wear glasses instead of contacts. Do not bring any jewelry or money (other than copays or fees as instructed). Do not wear make-up, particularly mascara, the morning of your surgery. Do not wear nail polish, particularly if you are having foot /hand surgery. Wear your hair loose or down, no ponytails, buns, bobby pins or clips. All body piercings must be removed.      8. You should understand that if you do not follow these instructions your surgery may be cancelled. If your physical condition changes (i.e. fever, cold or flu) please contact your surgeon as soon as possible.    9. It is important that you be on time. If a situation occurs where you may be late, or if you have any questions or problems, please call [phone].    10. Your surgery time may be subject to change. You will receive a phone call the day prior to surgery to confirm your arrival time.    Special Instructions:    Take all medications and inhalers, as prescribed, on the morning of surgery with a  sip of water EXCEPT: n/a      I understand a pre-operative phone call will be made to verify my surgery time.  In the event that I am not available, I give permission for a message to be left on my answering service and/or with another person?      yes         ___________________      ___________________      ________________  (Signature of Patient)          (Witness)                   (Date and Time)

## 2018-03-04 NOTE — Interval H&P Note (Signed)
Spoke with Lattie Haw at Dr Roselyn Meier office blood work from 02/05/18 is ok for surgery, no need to repeat.

## 2018-03-04 NOTE — Interval H&P Note (Signed)
Periop Notes  by Kristian Covey, RN at 03/04/18 1131                Author: Kristian Covey, RN  Service: --  Author Type: Registered Nurse       Filed: 03/04/18 1132  Date of Service: 03/04/18 1131  Status: Signed          Editor: Kristian Covey, RN (Registered Nurse)                    [facility]   Ambulatory Surgery Unit   Pre-operative Instructions      Surgery/Procedure Date 03/10/18           Tentative  Arrival Time 1330         1. On the day of your surgery/procedure, please report to the Ambulatory Surgery Unit Registration Desk and sign in at your designated time. The Ambulatory Surgery Unit is  located in [facility] on the [place] side of the hospital across from the [facility] building. Please have all of your health insurance cards and a photo ID.      2. You must have someone with you to drive you home, as you should not drive a car for 24 hours following anesthesia. Please make arrangements for a responsible adult friend or family member to stay with you for at least the first 24 hours after your  surgery.      3. Do not have anything to eat or drink (including water, gum, mints, coffee, juice) after 11:59 PM   03/09/18. This may not apply to medications prescribed by your physician.   (Please note below the special instructions with medications to take the morning of surgery, if applicable.)      4. We recommend you do not drink any alcoholic beverages for 24 hours before and after your surgery.      5. Contact your surgeons office for instructions on the following medications: non-steroidal  anti-inflammatory drugs (i.e. Advil, Aleve), vitamins, and supplements. (Some surgeons will want you to stop these medications prior to surgery and others may allow you to take them)    **If you are currently taking Plavix, Coumadin, Aspirin and/or other blood-thinning agents, contact your surgeon for instructions.** Your surgeon will partner with the physician prescribing  these  medications to determine if it is safe to stop or if you need to continue taking. Please do not stop taking these medications without instructions from your surgeon.      6. In an effort to help prevent surgical site infection, we ask that you shower with an anti-bacterial soap (i.e. Dial/ Safeguard, or the soap provided to you at your preadmission testing appointment) for 3 days prior to and on the morning of surgery , using a fresh towel after each shower. (Please begin this process with fresh bed linens.) Do not apply any lotions, powders , or deodorants after the shower on the day of your procedure. If applicable, please do not shave the operative site for 48 hours prior to surgery.       7. Wear comfortable clothes. Wear glasses instead of contacts. Do not bring any jewelry or money  (other than copays or fees as instructed). Do not wear make-up, particularly mascara, the morning of your surgery. Do not wear nail polish, particularly if you are having foot /hand surgery.  Wear your hair loose or down, no ponytails, buns, bobby pins or clips. All body piercings must  be removed.        8. You should understand that if you do not follow these instructions your surgery may be cancelled. If your physical condition changes (i.e. fever, cold or flu) please contact your surgeon as soon as possible.      9. It is important that you be on time. If a situation occurs where you may be late, or if you have any questions or problems, please call [phone].      10. Your surgery time may be subject to change. You will receive a phone call the day prior to surgery to confirm your arrival time.      Special Instructions:      Take all medications and inhalers, as prescribed, on the morning of surgery with a sip of water EXCEPT:  n/a         I understand a pre-operative phone call will be made to verify my surgery time.  In the event that I am not available, I give permission for a message to be left on my answering service  and/or with another person?       yes             ___________________      ___________________      ________________   (Signature of Patient)          (Witness)                   (Date and Time)

## 2018-03-06 NOTE — Telephone Encounter (Signed)
Spoke with patient informed her OK to take scheduled medication per PAT note. Express understanding.

## 2018-03-06 NOTE — Telephone Encounter (Signed)
Patient is scheduled for surgery 08/05. She needs to know if it is ok to take lyrica or gabapentin today. She feels the need to take the medicine now due to a burning sensation.

## 2018-03-10 ENCOUNTER — Inpatient Hospital Stay: Payer: MEDICARE

## 2018-03-10 MED ORDER — MIDAZOLAM 1 MG/ML IJ SOLN
1 mg/mL | INTRAMUSCULAR | Status: DC | PRN
Start: 2018-03-10 — End: 2018-03-10

## 2018-03-10 MED ORDER — BUPIVACAINE-EPINEPHRINE (PF) 0.5 %-1:200,000 IJ SOLN
0.5 %-1:200,000 | INTRAMUSCULAR | Status: AC
Start: 2018-03-10 — End: ?

## 2018-03-10 MED ORDER — PROPOFOL 10 MG/ML IV EMUL
10 mg/mL | INTRAVENOUS | Status: AC
Start: 2018-03-10 — End: ?

## 2018-03-10 MED ORDER — LIDOCAINE-EPINEPHRINE (PF) 1 %-1:200,000 IJ SOLN
1 %-:200,000 | INTRAMUSCULAR | Status: AC
Start: 2018-03-10 — End: ?

## 2018-03-10 MED ORDER — ONDANSETRON (PF) 4 MG/2 ML INJECTION
4 mg/2 mL | INTRAMUSCULAR | Status: AC
Start: 2018-03-10 — End: ?

## 2018-03-10 MED ORDER — ONDANSETRON (PF) 4 MG/2 ML INJECTION
4 mg/2 mL | INTRAMUSCULAR | Status: DC | PRN
Start: 2018-03-10 — End: 2018-03-10
  Administered 2018-03-10: 20:00:00 via INTRAVENOUS

## 2018-03-10 MED ORDER — DEXAMETHASONE SODIUM PHOSPHATE 4 MG/ML IJ SOLN
4 mg/mL | INTRAMUSCULAR | Status: DC | PRN
Start: 2018-03-10 — End: 2018-03-10
  Administered 2018-03-10: 20:00:00 via INTRAVENOUS

## 2018-03-10 MED ORDER — SODIUM BICARBONATE 4 % IV
4 % | INTRAVENOUS | Status: DC
Start: 2018-03-10 — End: 2018-03-10

## 2018-03-10 MED ORDER — OXYCODONE-ACETAMINOPHEN 5 MG-325 MG TAB
5-325 mg | ORAL_TABLET | Freq: Four times a day (QID) | ORAL | 0 refills | Status: AC | PRN
Start: 2018-03-10 — End: 2018-03-15

## 2018-03-10 MED ORDER — SODIUM CHLORIDE 0.9 % IJ SYRG
INTRAMUSCULAR | Status: DC | PRN
Start: 2018-03-10 — End: 2018-03-10

## 2018-03-10 MED ORDER — ONDANSETRON (PF) 4 MG/2 ML INJECTION
4 mg/2 mL | INTRAMUSCULAR | Status: DC | PRN
Start: 2018-03-10 — End: 2018-03-10

## 2018-03-10 MED ORDER — FENTANYL CITRATE (PF) 50 MCG/ML IJ SOLN
50 mcg/mL | INTRAMUSCULAR | Status: AC
Start: 2018-03-10 — End: ?

## 2018-03-10 MED ORDER — PROPOFOL 10 MG/ML IV EMUL
10 mg/mL | INTRAVENOUS | Status: DC | PRN
Start: 2018-03-10 — End: 2018-03-10
  Administered 2018-03-10: 20:00:00 via INTRAVENOUS

## 2018-03-10 MED ORDER — MORPHINE 10 MG/ML INJ SOLUTION
10 mg/ml | INTRAMUSCULAR | Status: DC | PRN
Start: 2018-03-10 — End: 2018-03-10

## 2018-03-10 MED ORDER — SODIUM CHLORIDE 0.9 % IJ SYRG
Freq: Three times a day (TID) | INTRAMUSCULAR | Status: DC
Start: 2018-03-10 — End: 2018-03-10

## 2018-03-10 MED ORDER — DEXAMETHASONE SODIUM PHOSPHATE 4 MG/ML IJ SOLN
4 mg/mL | INTRAMUSCULAR | Status: AC
Start: 2018-03-10 — End: ?

## 2018-03-10 MED ORDER — FENTANYL CITRATE (PF) 50 MCG/ML IJ SOLN
50 mcg/mL | INTRAMUSCULAR | Status: DC | PRN
Start: 2018-03-10 — End: 2018-03-10

## 2018-03-10 MED ORDER — LIDOCAINE (PF) 10 MG/ML (1 %) IJ SOLN
10 mg/mL (1 %) | INTRAMUSCULAR | Status: DC | PRN
Start: 2018-03-10 — End: 2018-03-10

## 2018-03-10 MED ORDER — HYDROMORPHONE (PF) 1 MG/ML IJ SOLN
1 mg/mL | INTRAMUSCULAR | Status: DC | PRN
Start: 2018-03-10 — End: 2018-03-10

## 2018-03-10 MED ORDER — CEFAZOLIN 2 GRAM/20 ML IN STERILE WATER INTRAVENOUS SYRINGE
2 gram/0 mL | Freq: Once | INTRAVENOUS | Status: AC
Start: 2018-03-10 — End: 2018-03-10
  Administered 2018-03-10: 20:00:00 via INTRAVENOUS

## 2018-03-10 MED ORDER — BUPIVACAINE-EPINEPHRINE (PF) 0.5 %-1:200,000 IJ SOLN
0.5 %-1:200,000 | INTRAMUSCULAR | Status: DC | PRN
Start: 2018-03-10 — End: 2018-03-10
  Administered 2018-03-10: 20:00:00 via SUBCUTANEOUS

## 2018-03-10 MED ORDER — LACTATED RINGERS IV
INTRAVENOUS | Status: DC
Start: 2018-03-10 — End: 2018-03-10
  Administered 2018-03-10: 19:00:00 via INTRAVENOUS

## 2018-03-10 MED ORDER — IBUPROFEN 400 MG TAB
400 mg | ORAL_TABLET | Freq: Three times a day (TID) | ORAL | 0 refills | Status: DC | PRN
Start: 2018-03-10 — End: 2018-03-18

## 2018-03-10 MED ORDER — DIPHENHYDRAMINE HCL 50 MG/ML IJ SOLN
50 mg/mL | INTRAMUSCULAR | Status: DC | PRN
Start: 2018-03-10 — End: 2018-03-10

## 2018-03-10 MED ORDER — LIDOCAINE (PF) 20 MG/ML (2 %) IJ SOLN
20 mg/mL (2 %) | INTRAMUSCULAR | Status: DC | PRN
Start: 2018-03-10 — End: 2018-03-10
  Administered 2018-03-10: 20:00:00 via INTRAVENOUS

## 2018-03-10 MED ORDER — LACTATED RINGERS IV
INTRAVENOUS | Status: DC | PRN
Start: 2018-03-10 — End: 2018-03-10
  Administered 2018-03-10: 20:00:00 via INTRAVENOUS

## 2018-03-10 MED ORDER — LIDOCAINE-EPINEPHRINE (PF) 1 %-1:200,000 IJ SOLN
1 %-:200,000 | INTRAMUSCULAR | Status: DC | PRN
Start: 2018-03-10 — End: 2018-03-10
  Administered 2018-03-10: 20:00:00 via SUBCUTANEOUS

## 2018-03-10 MED ORDER — LIDOCAINE (PF) 20 MG/ML (2 %) IJ SOLN
20 mg/mL (2 %) | INTRAMUSCULAR | Status: AC
Start: 2018-03-10 — End: ?

## 2018-03-10 MED FILL — CEFAZOLIN 2 GRAM/20 ML IN STERILE WATER INTRAVENOUS SYRINGE: 2 gram/0 mL | INTRAVENOUS | Qty: 20

## 2018-03-10 MED FILL — LACTATED RINGERS IV: INTRAVENOUS | Qty: 1000

## 2018-03-10 MED FILL — SENSORCAINE-MPF/EPINEPHRINE 0.5 %-1:200,000 INJECTION SOLUTION: 0.5 %-1:200,000 | INTRAMUSCULAR | Qty: 30

## 2018-03-10 MED FILL — NEUT 4 % INTRAVENOUS SOLUTION: 4 % | INTRAVENOUS | Qty: 5

## 2018-03-10 MED FILL — XYLOCAINE-MPF/EPINEPHRINE 1 %-1:200,000 INJECTION SOLUTION: 1 %-:200,000 | INTRAMUSCULAR | Qty: 30

## 2018-03-10 MED FILL — ONDANSETRON (PF) 4 MG/2 ML INJECTION: 4 mg/2 mL | INTRAMUSCULAR | Qty: 2

## 2018-03-10 MED FILL — XYLOCAINE-MPF 20 MG/ML (2 %) INJECTION SOLUTION: 20 mg/mL (2 %) | INTRAMUSCULAR | Qty: 2

## 2018-03-10 MED FILL — FENTANYL CITRATE (PF) 50 MCG/ML IJ SOLN: 50 mcg/mL | INTRAMUSCULAR | Qty: 2

## 2018-03-10 MED FILL — DIPRIVAN 10 MG/ML INTRAVENOUS EMULSION: 10 mg/mL | INTRAVENOUS | Qty: 100

## 2018-03-10 MED FILL — DEXAMETHASONE SODIUM PHOSPHATE 4 MG/ML IJ SOLN: 4 mg/mL | INTRAMUSCULAR | Qty: 1

## 2018-03-10 NOTE — Op Note (Signed)
[facility]  OPERATIVE REPORT    Name:  Kimberly [name], Kimberly [name]  MR#:  [id number]  DOB:  13-Feb-1924  ACCOUNT #:  [id number]  DATE OF SERVICE:  03/10/2018    PREOPERATIVE DIAGNOSIS:  Left breast carcinoma, upper outer quadrant.    POSTOPERATIVE DIAGNOSIS:  Left breast carcinoma, upper outer quadrant.    PROCEDURE PERFORMED:  Left breast lumpectomy with intraoperative ultrasound guidance.    SURGEON:  Luellen Pucker, MD    ASSISTANT:  Camelia Phenes, MD    ANESTHESIA:  MAC.    COMPLICATIONS:  None.    SPECIMENS REMOVED:  Left breast mass at 2 o'clock, two long sutures superior, two short sutures medially.    IMPLANTS:  None.    ESTIMATED BLOOD LOSS:  10 mL.    FINDINGS:  Clip and mass in the specimen was identified by ultrasound guidance.    BRIEF HISTORY:  The patient is a 84 year old female with recently diagnosed left breast carcinoma.  She wishes to undergo breast lumpectomy, minimal therapy without any involvement of the lymph nodes.  She understands the risks and benefits.  It is triple negative, but with more risk for recurrence, but she did not want to do anything more than just a lumpectomy now.  She now presents for that.    PROCEDURE:  The patient was taken to the operating room, placed on the operating table in the supine position, and underwent IV sedation and the left breast was prepped and draped in the usual sterile fashion.  After appropriate time-out, antibiotics were given.  The lesions were  identified with ultrasound guidance in the upper outer quadrant of the breast away from the nipple areolar complex and thus elected to use a radial incision.  We anesthetized the skin and subcutaneous tissues with 1% lidocaine with epinephrine as well as in the deeper tissues and 0.5% Marcaine as well.  We made a radial incision over the mass and then utilizing sharp dissection electrocautery, I took a wide berth around the  lesion to make sure we have decent margins and took it down all the way down to the pectoralis musculature.  The area of completely excised and then the ultrasound was used to assess margins and there was good bony margins.  The clip was identified.  Once this was completed, electrocautery was used to stop bleeding in the wound.  Interrupted 3-0 Vicryl was used to approximate the deep tissues and deep dermis and a running 4-0 Vicryl was used to close the skin and an Exofin dressings were applied.  Upon completion of the operation, the needle, sponge, and instrument counts were correct x2.  The patient had tolerated the procedure well and was brought to the recovery room in stable condition.      Luellen Pucker, MD      [id number]  D:  03/10/2018 16:21  T:  03/10/2018 21:26  JOB #:  [id number]  CC:  Luellen Pucker, MD       Donnel Saxon, MD

## 2018-03-10 NOTE — Anesthesia Post-Procedure Evaluation (Signed)
Procedure(s):  LEFT BREAST LUMPECTOMY WITH ULTRASOUND GUIDANCE.    MAC    Anesthesia Post Evaluation        Patient location during evaluation: PACU  Note status: Adequate.  Level of consciousness: responsive to verbal stimuli and sleepy but conscious  Pain management: satisfactory to patient  Airway patency: patent  Anesthetic complications: no  Cardiovascular status: acceptable  Respiratory status: acceptable  Hydration status: acceptable  Comments: +Post-Anesthesia Evaluation and Assessment    Patient: Kimberly [name] MRN: [id number]  SSN: [id number]   Date of Birth: 04/27/1924  Age: 84 y.o.  Sex: female      Cardiovascular Function/Vital Signs    BP 178/83 (BP 1 Location: Right arm, BP Patient Position: At rest)    Pulse 79    Temp 36.7 ??C (98 ??F)    Resp 21    Ht 5' 3" (1.6 m)    Wt 59.6 kg (131 lb 6 oz)    SpO2 95%    BMI 23.27 kg/m??     Patient is status post Procedure(s):  LEFT BREAST LUMPECTOMY WITH ULTRASOUND GUIDANCE.    Nausea/Vomiting: Controlled.    Postoperative hydration reviewed and adequate.    Pain:  Pain Scale 1: Numeric (0 - 10) (03/10/18 1700)  Pain Intensity 1: 0 (03/10/18 1700)   Managed.    Neurological Status:   Neuro (WDL): Within Defined Limits (03/10/18 1635)   At baseline.    Mental Status and Level of Consciousness: Arousable.    Pulmonary Status:   O2 Device: Room air (03/10/18 1700)   Adequate oxygenation and airway patent.    Complications related to anesthesia: None    Post-anesthesia assessment completed. No concerns.    Signed By: Carston Riedl, MD    03/10/2018  Post anesthesia nausea and vomiting:  controlled      Vitals Value Taken Time   BP 170/81 03/10/2018  4:45 PM   Temp 36.7 ??C (98 ??F) 03/10/2018  4:45 PM   Pulse 74 03/10/2018  4:45 PM   Resp 28 03/10/2018  4:45 PM   SpO2 93 % 03/10/2018  4:45 PM

## 2018-03-10 NOTE — Other (Signed)
Kimberly [name]  [birth year]  [id number]    Situation:  Verbal report given from: S. Sherrye Payor, RN and Murvin Natal, CRNA  Procedure: Procedure(s):  LEFT BREAST LUMPECTOMY WITH ULTRASOUND GUIDANCE    Background:    Preoperative diagnosis: LEFT BREAST CANCER    Postoperative diagnosis: LEFT BREAST CANCER    Operator:  Dr. Silvana Newness    Assistant(s): Circ-1: Laurette Schimke Selinda Orion, RN  Circ-Relief: Britt Bolognese, RN  Scrub Tech-1: Catha Gosselin  Scrub Tech-Relief: Cephus Richer  Surg Asst-1: Howie Ill    Specimens:   ID Type Source Tests Collected by Time Destination   1 : Left Breast (2 o'clock) Preservative Breast  Luellen Pucker, MD 03/10/2018 1603 Pathology       Assessment:  Intra-procedure medications         Anesthesia gave intra-procedure sedation and medications, see anesthesia flow sheet     Intravenous fluids: LR@ KVO     Vital signs stable       Recommendation:    Permission to share finding with niece [name] : yes

## 2018-03-10 NOTE — Other (Addendum)
1624: Patient received to PACU, VSS. Patient drowsy but arouses to voice, incision site intact to left breast. Patient has no complaints of pain at this time.     1633: Dr. Silvana Newness at bedside.     1648: Patient awake and alert tolerating liquids without issue. Patient has no complaints of pain.     1700: Patient's niece [name] at bedside.     Discharge instructions given. RX given. Patient and niece verbalize understanding of instructions and follow up appointment.     Patient and niece state ready for discharge. IV removed. Patient discharged at this time by wheelchair with belongings, niece to provide transportation home.

## 2018-03-10 NOTE — Brief Op Note (Signed)
BRIEF OPERATIVE NOTE    Date of Procedure: 03/10/2018   Preoperative Diagnosis: LEFT BREAST CANCER  Postoperative Diagnosis: LEFT BREAST CANCER    Procedure(s):  LEFT BREAST LUMPECTOMY WITH ULTRASOUND GUIDANCE  Surgeon(s) and Role:     * Luellen Pucker, MD - Primary         Surgical Assistant: Dorian Heckle    Surgical Staff:  Circ-1: Carmie End, RN  Circ-Relief: Britt Bolognese, RN  Scrub Tech-1: Catha Gosselin  Scrub Tech-Relief: Cephus Richer  Surg Asst-1: Howie Ill  Event Time In Time Out   Incision Start 1553    Incision Close       Anesthesia: General   Estimated Blood Loss: 10 ml  Specimens:   ID Type Source Tests Collected by Time Destination   1 : Left Breast (2 o'clock) Preservative Breast  Luellen Pucker, MD 03/10/2018 1603 Pathology      Findings: clip and mass in specimen   Complications: none  Implants: * No implants in log *

## 2018-03-10 NOTE — H&P (Signed)
HISTORY OF PRESENT ILLNESS  Kimberly [name] is a 84 y.o. female who comes in for consultation by Darlys Gales, MD for a new left breast cancer  Breast Cancer   Associated symptoms include headaches. Pertinent negatives include no chest pain, no abdominal pain and no shortness of breath.   she went for screening mammogram and was noted to have a density in the UOQ of the left breast.  Subsequently she had an [place] confirming a 2.0 x 1.8 x 1.6 cm in the 0100 position 6 cm from nipple.   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  She had not felt a lump, skin changes, nipple changes or drainage, unexplained weight loss or bone pain.  She had menarche at 74, only pregnancy at 89, menopause at 9 and did not take HRT.  Her MGM, M, D, and sister all died from metastatic breast cancer.     ??       Past Medical History:   Diagnosis Date   ??? Anginal pain ([place]) ??   ??? Arthritis ??   ??? Cancer ([facility]) 01/2018   ??? Chronic pain ??   ??? Constipation ??   ??? GERD (gastroesophageal reflux disease) ??   ??? Hearing loss ??   ??? Hearing loss ??   ??? High cholesterol ??   ??? Hypertension ??   ??? Incontinence ??   ??? Joint pain ??   ??? Memory disorder ??   ??? Muscle pain ??   ??? Osteoporosis ??   ??? Ringing in the ears ??   ??? Snoring ??   ??? Visual disturbance ??   ??        Past Surgical History:   Procedure Laterality Date   ??? HX HYSTERECTOMY ?? ??   ??? HX ORTHOPAEDIC ?? ??   ?? epidural pain mtg   ??? HX OTHER SURGICAL Right 10/15/2016   ?? ligament correction right foot   ??? HX OTHER SURGICAL ?? 11/26/2016   ?? Pituitary tumor removal   ??        Family History   Problem Relation Age of Onset   ??? Breast Cancer Mother 23   ??? Breast Cancer Sister 39   ??? Breast Cancer Daughter 90   ??? Dementia Brother ??   ??? Dementia Sister ??   ??  Social History   ??       Tobacco Use   ??? Smoking status: Never Smoker   ??? Smokeless tobacco: Never Used   Substance Use Topics   ??? Alcohol use: No   ??? Drug use: No   ??        Current Outpatient Medications   Medication Sig   ??? ezetimibe (ZETIA) 10 mg tablet TAKE 1 TABLET BY MOUTH DAILY   ??? gabapentin (NEURONTIN) 100 mg capsule TAKE 1 CAPSULE BY MOUTH THREE TIMES DAILY   ??? estradiol (VAGIFEM) 10 mcg tab vaginal tablet INSERT 1 T VAGINALLY TWICE PER WEEK   ??? verapamil ER (CALAN-SR) 180 mg CR tablet TAKE 1 TABLET BY MOUTH EVERY NIGHT   ??? isosorbide dinitrate (ISORDIL) 10 mg tablet TAKE 1 TABLET BY MOUTH TWICE DAILY   ??? lisinopril (PRINIVIL) 10 mg tablet Take 1 Tab by mouth daily.   ??? cholecalciferol (VITAMIN D3) 1,000 unit cap Take  by mouth daily.   ??? acetaminophen (TYLENOL) 325 mg tablet Take 1 Tab by mouth three (3) times daily.   ??? [employer]  1 Units by Does Not Apply route daily.   ??? aspirin 81 mg chewable tablet Take 81 mg by mouth daily.   ??? omeprazole (PRILOSEC) 40 mg capsule TAKE 1 CAPSULE BY MOUTH DAILY   ??? nortriptyline (PAMELOR) 10 mg capsule TAKE 1 CAPSULE BY MOUTH EVERY NIGHT   ??? alendronate (FOSAMAX) 70 mg tablet TAKE 1 TABLET BY MOUTH EVERY 7 DAYS   ??? oxybutynin (DITROPAN) 5 mg tablet TAKE 1 TABLET BY MOUTH TWICE DAILY   ??? cilostazol (PLETAL) 100 mg tablet TAKE 1 TABLET BY MOUTH TWICE DAILY ON EMPTY STOMACH   ??? pregabalin (LYRICA) 50 mg capsule Take 1 Cap by mouth two (2) times a day. Max Daily Amount: 100 mg.   ??? omeprazole (PRILOSEC) 40 mg capsule TAKE 1 CAPSULE BY MOUTH DAILY   ??? famotidine (PEPCID) 20 mg tablet Take 1 Tab by mouth two (2) times a day.   ??? hydrocortisone (CORTEF) 10 mg tablet TK 1 T PO Q 12 H   ??? simvastatin (ZOCOR) 5 mg tablet Take  by mouth nightly.   ??? polyethylene glycol (MIRALAX) 17 gram/dose powder TAKE 1 CAPFUL PO ONCE A DAY   ??? coenzyme q10-vitamin e (COQ10 SG 100) 100-100 mg-unit cap Take  by mouth.   ??? VITAMIN E MIXED (NATURAL VITAMIN E PO) Take 400 Units by mouth. 3 x week    ??? vitamin A 8,000 unit capsule Take 8,000 Units by mouth daily.   ??  No current facility-administered medications for this visit.    ??       Allergies    Allergen Reactions   ??? Pcn [Penicillins] Hives and Myalgia   ??? Sulfa (Sulfonamide Antibiotics) Unknown (comments)   ??  ??  ??  Review of Systems   Constitutional: Positive for malaise/fatigue. Negative for chills, diaphoresis, fever and weight loss.   HENT: Positive for hearing loss. Negative for sore throat.    Eyes: Negative for blurred vision and discharge.        Vision loss   Respiratory: Negative for cough, shortness of breath and wheezing.    Cardiovascular: Positive for leg swelling. Negative for chest pain, palpitations, orthopnea and claudication.   Gastrointestinal: Positive for nausea. Negative for abdominal pain, constipation, diarrhea, heartburn, melena and vomiting.   Genitourinary: Positive for dysuria. Negative for flank pain, frequency and hematuria.   Musculoskeletal: Negative for back pain, joint pain, myalgias and neck pain.   Skin: Negative for rash.   Neurological: Positive for headaches. Negative for dizziness, speech change, focal weakness, seizures, loss of consciousness and weakness.   Endo/Heme/Allergies: Does not bruise/bleed easily.   Psychiatric/Behavioral: Negative for depression and memory loss.   ??  Visit Vitals  BP 126/62 (BP 1 Location: Left arm, BP Patient Position: Sitting)   Pulse 72   Temp 96.7 ??F (35.9 ??C) (Oral)   Resp 16   Ht 5' 3"  (1.6 m)   Wt 60.3 kg (133 lb)   SpO2 95%   BMI 23.56 kg/m??   ??  ??  Physical Exam   Constitutional: She is oriented to person, place, and time. She appears well-developed and well-nourished. No distress.   HENT:   Head: Normocephalic and atraumatic.   Mouth/Throat: Oropharynx is clear and moist. No oropharyngeal exudate.   Eyes: Pupils are equal, round, and reactive to light. Conjunctivae and EOM are normal. No scleral icterus.   Neck: Normal range of motion. Neck supple. No JVD present. No tracheal deviation present. No thyromegaly present.   Cardiovascular:  Normal rate and regular rhythm. Exam reveals no gallop and no friction rub.    No murmur heard.  Pulmonary/Chest: Effort normal and breath sounds normal. No respiratory distress. She has no wheezes. She has no rales. Right breast exhibits no inverted nipple, no mass, no nipple discharge, no skin change and no tenderness. Left breast exhibits mass (3 cm mass in UOQ with bruising and tenderness), skin change and tenderness. Left breast exhibits no inverted nipple and no nipple discharge. Breasts are symmetrical.       Abdominal: Soft. Bowel sounds are normal. She exhibits no distension and no mass. There is no tenderness. There is no rebound and no guarding.   Musculoskeletal: Normal range of motion. She exhibits no edema.   Lymphadenopathy:     She has no cervical adenopathy.     She has no axillary adenopathy.        Right: No supraclavicular adenopathy present.        Left: No supraclavicular adenopathy present.   Neurological: She is alert and oriented to person, place, and time. No cranial nerve deficit.   Skin: Skin is warm and dry. No rash noted. She is not diaphoretic. No erythema. No pallor.   Psychiatric: Her behavior is normal. Judgment and thought content normal.   ??  I reviewed her mammogram and [place] images today  ??  ASSESSMENT and PLAN  1.  Newly diagnosed left breast cancer early stage, Clinically stage 2A (T2N0M0). Triple negative Dx 01/2018.   While spry and cognitively intact she is still not a very good candidate for chemotherapy.  I spent over 60 minutes discussing the anatomy and pathophysiology of breast cancer and treatment options, prognosis with her and her niece.  We discussed breast conservation therapy vs mastectomy with and without reconstruction, sentinel lymph node biopsy and axillary dissection, various forms of radiation (whole vs accelerated partial breast), medical oncology therapy (SERM vs Aromatase inhibitors, chemotherapy, herceptin).  I discussed about BRCA genetic testing and oncotype DX gene mapping of the tumor.  I  discussed the risks and benefits of surgery including bleeding, infection, paresthesias and numbness, cosmetic changes, lymphedema, seroma, chronic pain, and need for reoperation for positive margins/sentinel nodes. I discussed websites for her to review.  2.  Strong family hx breast cancer  Recommended and she agreed to genetic testing today with [facility]  3.  Allergy to PCN with hives and upset stomach.  Ok for Ancef  4.  Essential hypertension.  On rx  ??  She has elected to undergo a left lumpectomy with intraoperative US guidance under general/MAC anesthesia as an outpatient    She prefers not to undergo mastectomy nor SLNB  ??  ??  Luellen Pucker, MD FACS

## 2018-03-10 NOTE — Other (Signed)
Permission received to review discharge instructions and discuss private health information with  Niece Francesco Sor

## 2018-03-10 NOTE — Op Note (Signed)
[id number]

## 2018-03-10 NOTE — Anesthesia Pre-Procedure Evaluation (Signed)
Relevant Problems   No relevant active problems       Anesthetic History   No history of anesthetic complications            Review of Systems / Medical History  Patient summary reviewed, nursing notes reviewed and pertinent labs reviewed    Pulmonary  Within defined limits                 Neuro/Psych         Headaches     Cardiovascular    Hypertension            Pertinent negatives: No CAD  Exercise tolerance: <4 METS  Comments: ?? Baseline ECG: Normal sinus rhythm.  ?? There was no ST segment deviation noted during stress.  ?? NM: No ischemia demonstrated. No infarct visible. LVEF >65%.   GI/Hepatic/Renal     GERD           Endo/Other        Arthritis and cancer     Other Findings   Comments: Slow to wake after anesthesia           Physical Exam    Airway  Mallampati: II  TM Distance: 4 - 6 cm  Neck ROM: normal range of motion   Mouth opening: Normal     Cardiovascular  Regular rate and rhythm,  S1 and S2 normal,  no murmur, click, rub, or gallop             Dental    Dentition: Lower partial plate and Upper partial plate     Pulmonary  Breath sounds clear to auscultation               Abdominal  GI exam deferred       Other Findings            Anesthetic Plan    ASA: 2  Anesthesia type: MAC            Anesthetic plan and risks discussed with: Patient and Son / Daughter

## 2018-03-10 NOTE — Other (Signed)
Patient states that family will be with them for at least 24 hours following today's procedure.

## 2018-03-10 NOTE — Other (Signed)
Bair Paws applied at this time and set to appropriate temperature. Patient given remote and instructed on use. Will continue to monitor.

## 2018-03-10 NOTE — Interval H&P Note (Signed)
1624: Patient received to PACU, VSS. Patient drowsy but arouses to voice, incision site intact to left breast. Patient has no complaints of pain at this time.     1633: Dr. Shawna Orleans at bedside.     1648: Patient awake and alert tolerating liquids without issue. Patient has no complaints of pain.     1700: Patient's niece Bonita Quin at bedside.     Discharge instructions given. RX given. Patient and niece verbalize understanding of instructions and follow up appointment.     Patient and niece state ready for discharge. IV removed. Patient discharged at this time by wheelchair with belongings, niece to provide transportation home.

## 2018-03-10 NOTE — Op Note (Signed)
BRIEF OPERATIVE NOTE    Date of Procedure: 03/10/2018   Preoperative Diagnosis: LEFT BREAST CANCER  Postoperative Diagnosis: LEFT BREAST CANCER    Procedure(s):  LEFT BREAST LUMPECTOMY WITH ULTRASOUND GUIDANCE  Surgeon(s) and Role:     * Luellen Pucker, MD - Primary         Surgical Assistant: Dorian Heckle    Surgical Staff:  Circ-1: Carmie End, RN  Circ-Relief: Britt Bolognese, RN  Scrub Tech-1: Catha Gosselin  Scrub Tech-Relief: Cephus Richer  Surg Asst-1: Howie Ill  Event Time In Time Out   Incision Start 1553    Incision Close       Anesthesia: General   Estimated Blood Loss: 10 ml  Specimens:   ID Type Source Tests Collected by Time Destination   1 : Left Breast (2 o'clock) Preservative Breast  Luellen Pucker, MD 03/10/2018 1603 Pathology      Findings: clip and mass in specimen   Complications: none  Implants: * No implants in log *

## 2018-03-10 NOTE — H&P (Signed)
H&P by Luellen Pucker, MD at 03/10/18 1506                Author: Luellen Pucker, MD  Service: --  Author Type: Physician       Filed: 03/10/18 1507  Date of Service: 03/10/18 1506  Status: Signed          Editor: Luellen Pucker, MD (Physician)               HISTORY OF PRESENT ILLNESS   Kimberly [name] is a 84 y.o. female who comes in for consultation by Darlys Gales, MD for a new left breast cancer   Breast Cancer    Associated symptoms include headaches. Pertinent negatives include no chest pain, no abdominal pain and no shortness of breath.    she went for screening mammogram and was noted to have a density in the UOQ of the left breast.  Subsequently she had an [place] confirming a 2.0 x 1.8 x 1.6 cm in the 0100 position 6 cm from nipple.   Core biopsy 01/21/2018 demonstrated a poorly differentiated  carcinoma compatible with IDC G3 triple negative.  She had not felt a lump, skin changes, nipple changes or drainage, unexplained weight loss or bone pain.  She had menarche at 83, only pregnancy at 49, menopause at 49 and did not take HRT.  Her MGM,  M, D, and sister all died from metastatic breast cancer.      ??                  Past Medical History:        Diagnosis  Date         ?  Anginal pain ([place])  ??     ?  Arthritis  ??     ?  Cancer ([place])  01/2018     ?  Chronic pain  ??     ?  Constipation  ??     ?  GERD (gastroesophageal reflux disease)  ??     ?  Hearing loss  ??     ?  Hearing loss  ??     ?  High cholesterol  ??     ?  Hypertension  ??     ?  Incontinence  ??     ?  Joint pain  ??     ?  Memory disorder  ??     ?  Muscle pain  ??     ?  Osteoporosis  ??     ?  Ringing in the ears  ??     ?  Snoring  ??     ?  Visual disturbance  ??     ??                     Past Surgical History:         Procedure  Laterality  Date          ?  HX HYSTERECTOMY  ??  ??     ?  HX ORTHOPAEDIC  ??  ??        ??  epidural pain mtg          ?  HX OTHER SURGICAL  Right  10/15/2016        ??  ligament correction right foot           ?  HX OTHER SURGICAL  ??  11/26/2016        ??  Pituitary tumor removal     ??                     Family History         Problem  Relation  Age of Onset          ?  Breast Cancer  Mother  32     ?  Breast Cancer  Sister  46     ?  Breast Cancer  Daughter  29     ?  Dementia  Brother  ??     ?  Dementia  Sister  ??     ??     Social History     ??                  Tobacco Use         ?  Smoking status:  Never Smoker     ?  Smokeless tobacco:  Never Used       Substance Use Topics         ?  Alcohol use:  No     ?  Drug use:  No     ??                  Current Outpatient Medications        Medication  Sig         ?  ezetimibe (ZETIA) 10 mg tablet  TAKE 1 TABLET BY MOUTH DAILY     ?  gabapentin (NEURONTIN) 100 mg capsule  TAKE 1 CAPSULE BY MOUTH THREE TIMES DAILY     ?  estradiol (VAGIFEM) 10 mcg tab vaginal tablet  INSERT 1 T VAGINALLY TWICE PER WEEK     ?  verapamil ER (CALAN-SR) 180 mg CR tablet  TAKE 1 TABLET BY MOUTH EVERY NIGHT     ?  isosorbide dinitrate (ISORDIL) 10 mg tablet  TAKE 1 TABLET BY MOUTH TWICE DAILY     ?  lisinopril (PRINIVIL) 10 mg tablet  Take 1 Tab by mouth daily.     ?  cholecalciferol (VITAMIN D3) 1,000 unit cap  Take  by mouth daily.     ?  acetaminophen (TYLENOL) 325 mg tablet  Take 1 Tab by mouth three (3) times daily.     ?  Walker misc  1 Units by Does Not Apply route daily.     ?  aspirin 81 mg chewable tablet  Take 81 mg by mouth daily.     ?  omeprazole (PRILOSEC) 40 mg capsule  TAKE 1 CAPSULE BY MOUTH DAILY     ?  nortriptyline (PAMELOR) 10 mg capsule  TAKE 1 CAPSULE BY MOUTH EVERY NIGHT     ?  alendronate (FOSAMAX) 70 mg tablet  TAKE 1 TABLET BY MOUTH EVERY 7 DAYS     ?  oxybutynin (DITROPAN) 5 mg tablet  TAKE 1 TABLET BY MOUTH TWICE DAILY     ?  cilostazol (PLETAL) 100 mg tablet  TAKE 1 TABLET BY MOUTH TWICE DAILY ON EMPTY STOMACH     ?  pregabalin (LYRICA) 50 mg capsule  Take 1 Cap by mouth two (2) times a day. Max Daily Amount: 100 mg.     ?  omeprazole (PRILOSEC) 40 mg capsule   TAKE 1 CAPSULE BY MOUTH DAILY     ?  famotidine (PEPCID) 20 mg tablet  Take  1 Tab by mouth two (2) times a day.     ?  hydrocortisone (CORTEF) 10 mg tablet  TK 1 T PO Q 12 H     ?  simvastatin (ZOCOR) 5 mg tablet  Take  by mouth nightly.     ?  polyethylene glycol (MIRALAX) 17 gram/dose powder  TAKE 1 CAPFUL PO ONCE A DAY     ?  coenzyme q10-vitamin e (COQ10 SG 100) 100-100 mg-unit cap  Take  by mouth.     ?  VITAMIN E MIXED (NATURAL VITAMIN E PO)  Take 400 Units by mouth. 3 x week      ?  vitamin A 8,000 unit capsule  Take 8,000 Units by mouth daily.     ??     No current facility-administered medications for this visit.      ??                  Allergies        Allergen  Reactions         ?  Pcn [Penicillins]  Hives and Myalgia     ?  Sulfa (Sulfonamide Antibiotics)  Unknown (comments)     ??   ??   ??   Review of Systems    Constitutional: Positive for malaise/fatigue. Negative for chills, diaphoresis, fever and weight loss.    HENT: Positive for hearing loss. Negative for sore throat.     Eyes: Negative for blurred vision and discharge.         Vision loss    Respiratory: Negative for cough, shortness of breath and wheezing.     Cardiovascular: Positive for leg swelling. Negative for chest pain, palpitations, orthopnea and claudication.    Gastrointestinal: Positive for nausea. Negative for abdominal pain, constipation, diarrhea, heartburn, melena and vomiting.    Genitourinary: Positive for dysuria. Negative for flank pain, frequency and hematuria.    Musculoskeletal: Negative for back pain, joint pain, myalgias and neck pain.    Skin: Negative for rash.    Neurological: Positive for headaches. Negative for dizziness, speech change, focal weakness, seizures, loss of consciousness and weakness.    Endo/Heme/Allergies: Does not bruise/bleed easily.    Psychiatric/Behavioral: Negative for depression and memory loss.    ??   Visit Vitals      BP  126/62 (BP 1 Location: Left arm, BP Patient Position: Sitting)     Pulse   72     Temp  96.7 ??F (35.9 ??C) (Oral)     Resp  16     Ht  _0  (1.6 m)     Wt  60.3 kg (133 lb)     SpO2  95%     BMI  23.56 kg/m??     ??   ??   Physical Exam    Constitutional: She is oriented to person, place, and time. She appears well-developed and well-nourished. No distress.    HENT:    Head: Normocephalic and atraumatic.    Mouth/Throat: Oropharynx is clear and moist. No oropharyngeal exudate.    Eyes: Pupils are equal, round, and reactive to light. Conjunctivae and EOM are normal. No scleral icterus.    Neck: Normal range of motion. Neck supple. No JVD present. No tracheal deviation present. No thyromegaly present.    Cardiovascular: Normal rate and regular rhythm. Exam reveals no gallop and no friction rub.    No murmur heard.   Pulmonary/Chest: Effort normal and breath sounds normal. No  respiratory distress. She has no wheezes. She has no rales. Right breast exhibits no inverted nipple, no mass, no nipple discharge, no skin change and no tenderness. Left breast exhibits  mass (3 cm mass in UOQ with bruising and tenderness),  skin change and tenderness. Left breast exhibits no inverted nipple and no nipple discharge.  Breasts are symmetrical.          Abdominal: Soft. Bowel sounds are normal. She exhibits no distension and no mass. There is no tenderness. There is no rebound and no guarding.   Musculoskeletal: Normal range of motion. She exhibits no edema.   Lymphadenopathy:      She has no cervical adenopathy.     She has no axillary adenopathy.        Right: No supraclavicular adenopathy present.        Left: No supraclavicular adenopathy present.   Neurological: She is alert and oriented  to person, place, and time. No cranial nerve deficit.    Skin: Skin is warm and dry. No rash noted. She is not diaphoretic. No erythema. No pallor.   Psychiatric: Her behavior is normal. Judgment and thought content normal.    ??   I reviewed her mammogram and [place] images today   ??   ASSESSMENT and PLAN   1.  Newly  diagnosed left breast cancer early stage, Clinically stage 2A (T2N0M0). Triple negative Dx 01/2018.   While spry and cognitively intact she is still not a very good candidate for chemotherapy.  I spent over 60 minutes discussing the  anatomy and pathophysiology of breast cancer and treatment options, prognosis with her and her niece.  We discussed breast conservation therapy vs mastectomy with and without reconstruction, sentinel lymph node biopsy and axillary dissection, various  forms of radiation (whole vs accelerated partial breast), medical oncology therapy (SERM vs Aromatase inhibitors, chemotherapy, herceptin).  I discussed about BRCA genetic testing and oncotype DX gene mapping of the tumor.  I discussed the risks and  benefits of surgery including bleeding, infection, paresthesias and numbness, cosmetic changes, lymphedema, seroma, chronic pain, and need for reoperation for positive margins/sentinel nodes. I discussed websites for her to review.   2.  Strong family hx breast cancer   Recommended and she agreed to genetic testing today with [facility]   3.  Allergy to PCN with hives and upset stomach.  Ok for Ancef   4.  Essential hypertension.  On rx   ??   She has elected to undergo a left lumpectomy with intraoperative US guidance under general/MAC anesthesia as an outpatient      She prefers not to undergo mastectomy nor SLNB   ??   ??   Luellen Pucker, MD FACS

## 2018-03-10 NOTE — Interval H&P Note (Signed)
Kimberly [name]  1924/02/27  [id number]    Situation:  Verbal report given from: S. Sherrye Payor, RN and Murvin Natal, CRNA  Procedure: Procedure(s):  LEFT BREAST LUMPECTOMY WITH ULTRASOUND GUIDANCE    Background:    Preoperative diagnosis: LEFT BREAST CANCER    Postoperative diagnosis: LEFT BREAST CANCER    Operator:  Dr. Silvana Newness    Assistant(s): Circ-1: Laurette Schimke Selinda Orion, RN  Circ-Relief: Britt Bolognese, RN  Scrub Tech-1: Catha Gosselin  Scrub Tech-Relief: Cephus Richer  Surg Asst-1: Howie Ill    Specimens:   ID Type Source Tests Collected by Time Destination   1 : Left Breast (2 o'clock) Preservative Breast  Luellen Pucker, MD 03/10/2018 1603 Pathology       Assessment:  Intra-procedure medications         Anesthesia gave intra-procedure sedation and medications, see anesthesia flow sheet     Intravenous fluids: LR@ KVO     Vital signs stable       Recommendation:    Permission to share finding with niece [name] : yes

## 2018-03-10 NOTE — Op Note (Signed)
[facility]  OPERATIVE REPORT    Name:  Kimberly [name], Kimberly [name]  MR#:  [id number]  DOB:  1923/11/06  ACCOUNT #:  [id number]  DATE OF SERVICE:  03/10/2018    PREOPERATIVE DIAGNOSIS:  Left breast carcinoma, upper outer quadrant.    POSTOPERATIVE DIAGNOSIS:  Left breast carcinoma, upper outer quadrant.    PROCEDURE PERFORMED:  Left breast lumpectomy with intraoperative ultrasound guidance.    SURGEON:  Luellen Pucker, MD    ASSISTANT:  Camelia Phenes, MD    ANESTHESIA:  MAC.    COMPLICATIONS:  None.    SPECIMENS REMOVED:  Left breast mass at 2 o'clock, two long sutures superior, two short sutures medially.    IMPLANTS:  None.    ESTIMATED BLOOD LOSS:  10 mL.    FINDINGS:  Clip and mass in the specimen was identified by ultrasound guidance.    BRIEF HISTORY:  The patient is a 84 year old female with recently diagnosed left breast carcinoma.  She wishes to undergo breast lumpectomy, minimal therapy without any involvement of the lymph nodes.  She understands the risks and benefits.  It is triple negative, but with more risk for recurrence, but she did not want to do anything more than just a lumpectomy now.  She now presents for that.    PROCEDURE:  The patient was taken to the operating room, placed on the operating table in the supine position, and underwent IV sedation and the left breast was prepped and draped in the usual sterile fashion.  After appropriate time-out, antibiotics were given.  The lesions were  identified with ultrasound guidance in the upper outer quadrant of the breast away from the nipple areolar complex and thus elected to use a radial incision.  We anesthetized the skin and subcutaneous tissues with 1% lidocaine with epinephrine as well as in the deeper tissues and 0.5% Marcaine as well.  We made a radial incision over the mass and then utilizing sharp dissection electrocautery, I took a wide berth around the lesion to make sure we have decent margins and took it down all the  way down to the pectoralis musculature.  The area of completely excised and then the ultrasound was used to assess margins and there was good bony margins.  The clip was identified.  Once this was completed, electrocautery was used to stop bleeding in the wound.  Interrupted 3-0 Vicryl was used to approximate the deep tissues and deep dermis and a running 4-0 Vicryl was used to close the skin and an Exofin dressings were applied.  Upon completion of the operation, the needle, sponge, and instrument counts were correct x2.  The patient had tolerated the procedure well and was brought to the recovery room in stable condition.      Luellen Pucker, MD      [id number]  D:  03/10/2018 16:21  T:  03/10/2018 21:26  JOB #:  [id number]  CC:  Luellen Pucker, MD       Donnel Saxon, MD

## 2018-03-10 NOTE — Interval H&P Note (Signed)
Permission received to review discharge instructions and discuss private health information with  Niece Francesco Sor

## 2018-03-10 NOTE — Interval H&P Note (Signed)
Bair Paws applied at this time and set to appropriate temperature. Patient given remote and instructed on use. Will continue to monitor.

## 2018-03-10 NOTE — Anesthesia Post-Procedure Evaluation (Signed)
Procedure(s):  LEFT BREAST LUMPECTOMY WITH ULTRASOUND GUIDANCE.    MAC    Anesthesia Post Evaluation        Patient location during evaluation: PACU  Note status: Adequate.  Level of consciousness: responsive to verbal stimuli and sleepy but conscious  Pain management: satisfactory to patient  Airway patency: patent  Anesthetic complications: no  Cardiovascular status: acceptable  Respiratory status: acceptable  Hydration status: acceptable  Comments: +Post-Anesthesia Evaluation and Assessment    Patient: Kimberly [name] MRN: [id number]  SSN: [id number]   Date of Birth: [birth year]  Age: 84 y.o.  Sex: female      Cardiovascular Function/Vital Signs    BP 178/83 (BP 1 Location: Right arm, BP Patient Position: At rest)    Pulse 79    Temp 36.7 ??C (98 ??F)    Resp 21    Ht _0  (1.6 m)    Wt 59.6 kg (131 lb 6 oz)    SpO2 95%    BMI 23.27 kg/m??     Patient is status post Procedure(s):  LEFT BREAST LUMPECTOMY WITH ULTRASOUND GUIDANCE.    Nausea/Vomiting: Controlled.    Postoperative hydration reviewed and adequate.    Pain:  Pain Scale 1: Numeric (0 - 10) (03/10/18 1700)  Pain Intensity 1: 0 (03/10/18 1700)   Managed.    Neurological Status:   Neuro (WDL): Within Defined Limits (03/10/18 1635)   At baseline.    Mental Status and Level of Consciousness: Arousable.    Pulmonary Status:   O2 Device: Room air (03/10/18 1700)   Adequate oxygenation and airway patent.    Complications related to anesthesia: None    Post-anesthesia assessment completed. No concerns.    Signed By: Brunilda Payor, MD    03/10/2018  Post anesthesia nausea and vomiting:  controlled      Vitals Value Taken Time   BP 170/81 03/10/2018  4:45 PM   Temp 36.7 ??C (98 ??F) 03/10/2018  4:45 PM   Pulse 74 03/10/2018  4:45 PM   Resp 28 03/10/2018  4:45 PM   SpO2 93 % 03/10/2018  4:45 PM

## 2018-03-10 NOTE — Anesthesia Pre-Procedure Evaluation (Signed)
Relevant Problems   No relevant active problems       Anesthetic History   No history of anesthetic complications            Review of Systems / Medical History  Patient summary reviewed, nursing notes reviewed and pertinent labs reviewed    Pulmonary  Within defined limits                 Neuro/Psych         Headaches     Cardiovascular    Hypertension            Pertinent negatives: No CAD  Exercise tolerance: <4 METS  Comments: ?? Baseline ECG: Normal sinus rhythm.  ?? There was no ST segment deviation noted during stress.  ?? NM: No ischemia demonstrated. No infarct visible. LVEF >65%.   GI/Hepatic/Renal     GERD           Endo/Other        Arthritis and cancer     Other Findings   Comments: Slow to wake after anesthesia           Physical Exam    Airway  Mallampati: II  TM Distance: 4 - 6 cm  Neck ROM: normal range of motion   Mouth opening: Normal     Cardiovascular  Regular rate and rhythm,  S1 and S2 normal,  no murmur, click, rub, or gallop             Dental    Dentition: Lower partial plate and Upper partial plate     Pulmonary  Breath sounds clear to auscultation               Abdominal  GI exam deferred       Other Findings            Anesthetic Plan    ASA: 2  Anesthesia type: MAC            Anesthetic plan and risks discussed with: Patient and Son / Daughter

## 2018-03-10 NOTE — Interval H&P Note (Signed)
Patient states that family will be with them for at least 24 hours following today's procedure.

## 2018-03-14 ENCOUNTER — Ambulatory Visit: Admit: 2018-03-14 | Discharge: 2018-03-14 | Payer: MEDICARE | Attending: Surgery | Primary: Family Medicine

## 2018-03-14 ENCOUNTER — Encounter: Attending: Surgery | Primary: Family Medicine

## 2018-03-14 ENCOUNTER — Ambulatory Visit: Attending: Surgery | Primary: Family Medicine

## 2018-03-14 DIAGNOSIS — Z09 Encounter for follow-up examination after completed treatment for conditions other than malignant neoplasm: Secondary | ICD-10-CM

## 2018-03-14 NOTE — Progress Notes (Signed)
Chief Complaint   Patient presents with   ??? Post OP Follow Up     S/P Left breast lumpectomy with intraoperative ultrasound guidance.     1. Have you been to the ER, urgent care clinic since your last visit?  Hospitalized since your last visit? NO    2. Have you seen or consulted any other health care providers outside of the [place] [facility] since your last visit?  Include any pap smears or colon screening. NO

## 2018-03-14 NOTE — Progress Notes (Signed)
Surgery  Follow up  Procedure: left lumpectomy  OR date:  03/10/2018  Path:       Left breast, 2:00, excision:   Invasive Carcinoma of the Breast Cancer Case Summary:   Procedure: Excision.   Specimen Laterality: Left.   Tumor Size: Size of Largest Invasive Carcinoma: 22 mm.   Histologic Type: Poorly differentiated carcinoma.   Histologic Grade (Nottingham Histologic Score):   - Glandular (Acinar)/Tubular Differentiation: Score 3.   - Nuclear Pleomorphism: Score 3.   - Mitotic Rate: Score 3.   - Overall Grade: Grade 3 (Score9).   Tumor Focality: Unifocal.   Ductal Carcinoma in Situ (DCIS): Not identified.   Margins:   - Invasive Carcinoma: Margins uninvolved by invasive carcinoma. Distance from closest margin: 5 mm, posterior.   Lymph Nodes: No lymph nodes submitted or found.   Treatment effect in breast: No known presurgical therapy.   Lymph-Vascular Invasion: Not identified.   Pathologic Staging: pT2 NX   Breast biomarkers previously reported ([id number]): ER negative, PR negative, HER-2/neu negative.     S I feel fine, no drainage    Visit Vitals  BP 148/69 (BP 1 Location: Left arm, BP Patient Position: Sitting)   Pulse 61   Temp 97 ??F (36.1 ??C)   Ht 5' 3" (1.6 m)   Wt 62.1 kg (136 lb 12.8 oz)   SpO2 98%   BMI 24.23 kg/m??       O Incisions healing well without infection   Mild seroma    A/P Doing well   Discussed path.  She is at high risk for local failure   Discussed possible WBRT and APBI   She is interested in seeing Dr Briscoe Burns but not convinced she will proceed with RT   RTC 4 weeks    Luellen Pucker, MD FACS

## 2018-03-14 NOTE — Progress Notes (Signed)
 Chief Complaint   Patient presents with   . Post OP Follow Up     S/P Left breast lumpectomy with intraoperative ultrasound guidance.     1. Have you been to the ER, urgent care clinic since your last visit?  Hospitalized since your last visit? NO    2. Have you seen or consulted any other health care providers outside of the [facility] since your last visit?  Include any pap smears or colon screening. NO

## 2018-03-14 NOTE — Progress Notes (Signed)
Surgery  Follow up  Procedure: left lumpectomy  OR date:  03/10/2018  Path:       Left breast, 2:00, excision:   Invasive Carcinoma of the Breast Cancer Case Summary:   Procedure: Excision.   Specimen Laterality: Left.   Tumor Size: Size of Largest Invasive Carcinoma: 22 mm.   Histologic Type: Poorly differentiated carcinoma.   Histologic Grade (Nottingham Histologic Score):   - Glandular (Acinar)/Tubular Differentiation: Score 3.   - Nuclear Pleomorphism: Score 3.   - Mitotic Rate: Score 3.   - Overall Grade: Grade 3 (Score9).   Tumor Focality: Unifocal.   Ductal Carcinoma in Situ (DCIS): Not identified.   Margins:   - Invasive Carcinoma: Margins uninvolved by invasive carcinoma. Distance from closest margin: 5 mm, posterior.   Lymph Nodes: No lymph nodes submitted or found.   Treatment effect in breast: No known presurgical therapy.   Lymph-Vascular Invasion: Not identified.   Pathologic Staging: pT2 NX   Breast biomarkers previously reported ([id number]): ER negative, PR negative, HER-2/neu negative.     S I feel fine, no drainage    Visit Vitals  BP 148/69 (BP 1 Location: Left arm, BP Patient Position: Sitting)   Pulse 61   Temp 97 ??F (36.1 ??C)   Ht 5' 3" (1.6 m)   Wt 62.1 kg (136 lb 12.8 oz)   SpO2 98%   BMI 24.23 kg/m??       O Incisions healing well without infection   Mild seroma    A/P Doing well   Discussed path.  She is at high risk for local failure   Discussed possible WBRT and APBI   She is interested in seeing Dr Urdaneta but not convinced she will proceed with RT   RTC 4 weeks    Braxton Vantrease J Derek Huneycutt, MD FACS

## 2018-03-17 NOTE — Telephone Encounter (Signed)
Spoke with patient she is aware appt with Dr. Briscoe Burns scheduled for 04/16/2018 at 3pm.  She verbalized understanding.

## 2018-03-18 MED ORDER — IBUPROFEN 400 MG TAB
400 mg | ORAL_TABLET | ORAL | 0 refills | Status: DC
Start: 2018-03-18 — End: 2018-06-16

## 2018-03-24 MED ORDER — OMEPRAZOLE 40 MG CAP, DELAYED RELEASE
40 mg | ORAL_CAPSULE | ORAL | 0 refills | Status: DC
Start: 2018-03-24 — End: 2018-04-22

## 2018-04-01 NOTE — Telephone Encounter (Signed)
-----   Message from Enid Cutter sent at 04/01/2018  1:37 PM EDT -----  Regarding: [place]/Rx  Pts caregiver nurse Kristie Cowman is requesting a Rx for Gabapentin and she has no medication left.Ms Ileana Roup number is  [phone] and Manuela Neptune number is [phone].

## 2018-04-02 ENCOUNTER — Encounter

## 2018-04-02 NOTE — Telephone Encounter (Signed)
Please check the dosage on this with patient's niece    It looks like I was prescribing 100 tid not 300 tid!

## 2018-04-04 MED ORDER — GABAPENTIN 300 MG CAP
300 mg | ORAL_CAPSULE | Freq: Three times a day (TID) | ORAL | 3 refills | Status: DC
Start: 2018-04-04 — End: 2018-06-16

## 2018-04-15 ENCOUNTER — Ambulatory Visit: Admit: 2018-04-15 | Discharge: 2018-04-15 | Payer: MEDICARE | Attending: Surgery | Primary: Family Medicine

## 2018-04-15 ENCOUNTER — Ambulatory Visit: Attending: Surgery | Primary: Family Medicine

## 2018-04-15 DIAGNOSIS — Z09 Encounter for follow-up examination after completed treatment for conditions other than malignant neoplasm: Secondary | ICD-10-CM

## 2018-04-15 NOTE — Progress Notes (Signed)
Surgery  Follow up  Procedure: left lumpectomy  OR date:  03/10/2018  Path:       Left breast, 2:00, excision:   Invasive Carcinoma of the Breast Cancer Case Summary:   Procedure: Excision.   Specimen Laterality: Left.   Tumor Size: Size of Largest Invasive Carcinoma: 22 mm.   Histologic Type: Poorly differentiated carcinoma.   Histologic Grade (Nottingham Histologic Score):   - Glandular (Acinar)/Tubular Differentiation: Score 3.   - Nuclear Pleomorphism: Score 3.   - Mitotic Rate: Score 3.   - Overall Grade: Grade 3 (Score9).   Tumor Focality: Unifocal.   Ductal Carcinoma in Situ (DCIS): Not identified.   Margins:   - Invasive Carcinoma: Margins uninvolved by invasive carcinoma. Distance from closest margin: 5 mm, posterior.   Lymph Nodes: No lymph nodes submitted or found.   Treatment effect in breast: No known presurgical therapy.   Lymph-Vascular Invasion: Not identified.   Pathologic Staging: pT2 NX   Breast biomarkers previously reported ([id number]): ER negative, PR negative, HER-2/neu negative.     S I feel fine, no drainage    Visit Vitals  BP 142/70 (BP 1 Location: Right arm, BP Patient Position: Sitting)   Pulse 68   Temp 96.5 ??F (35.8 ??C) (Oral)   Resp 20   Ht _0  (1.6 m)   Wt 60.8 kg (134 lb 1.6 oz)   SpO2 97%   BMI 23.75 kg/m??       O Incisions healing well without infection   Mild seroma    A/P Doing well   Discussed path.  She is at high risk for local failure   She is not a good candidate for adjuvant chemotherapy.  Discussed seeing medical oncology but she declined   She seeing Dr Briscoe Burns tomorrow to discuss WBRT    RTC 4 months         Luellen Pucker, MD FACS

## 2018-04-15 NOTE — Progress Notes (Signed)
Chief Complaint   Patient presents with   ??? Follow-up     4 week follow-up breast cancer.       1. Have you been to the ER, urgent care clinic since your last visit?  Hospitalized since your last visit?no    2. Have you seen or consulted any other health care providers outside of the [facility] since your last visit?  Include any pap smears or colon screening. yes

## 2018-04-15 NOTE — Progress Notes (Signed)
Chief Complaint   Patient presents with   . Follow-up     4 week follow-up breast cancer.       1. Have you been to the ER, urgent care clinic since your last visit?  Hospitalized since your last visit?no    2. Have you seen or consulted any other health care providers outside of the [facility] since your last visit?  Include any pap smears or colon screening. yes

## 2018-04-23 ENCOUNTER — Encounter

## 2018-04-24 MED ORDER — OMEPRAZOLE 40 MG CAP, DELAYED RELEASE
40 mg | ORAL_CAPSULE | ORAL | 0 refills | Status: DC
Start: 2018-04-24 — End: 2018-06-02

## 2018-04-25 MED ORDER — LYRICA 50 MG CAPSULE
50 mg | ORAL_CAPSULE | ORAL | 0 refills | Status: DC
Start: 2018-04-25 — End: 2018-06-16

## 2018-05-17 MED ORDER — OXYBUTYNIN CHLORIDE 5 MG TAB
5 mg | ORAL_TABLET | ORAL | 0 refills | Status: DC
Start: 2018-05-17 — End: 2018-06-16

## 2018-06-01 MED ORDER — VERAPAMIL ER 180 MG TAB
180 mg | ORAL_TABLET | ORAL | 0 refills | Status: DC
Start: 2018-06-01 — End: 2018-09-10

## 2018-06-08 MED ORDER — OMEPRAZOLE 40 MG CAP, DELAYED RELEASE
40 mg | ORAL_CAPSULE | ORAL | 6 refills | Status: DC
Start: 2018-06-08 — End: 2018-06-16

## 2018-06-08 MED ORDER — EZETIMIBE 10 MG TAB
10 mg | ORAL_TABLET | ORAL | 0 refills | Status: DC
Start: 2018-06-08 — End: 2018-06-16

## 2018-06-16 ENCOUNTER — Ambulatory Visit: Admit: 2018-06-16 | Discharge: 2018-06-16 | Payer: MEDICARE | Attending: Internal Medicine | Primary: Family Medicine

## 2018-06-16 ENCOUNTER — Ambulatory Visit: Attending: Internal Medicine | Primary: Family Medicine

## 2018-06-16 DIAGNOSIS — I10 Essential (primary) hypertension: Secondary | ICD-10-CM

## 2018-06-16 MED ORDER — ALENDRONATE 70 MG TAB
70 mg | ORAL_TABLET | ORAL | 5 refills | Status: DC
Start: 2018-06-16 — End: 2021-05-12

## 2018-06-16 MED ORDER — CILOSTAZOL 100 MG TAB
100 mg | ORAL_TABLET | ORAL | 0 refills | Status: DC
Start: 2018-06-16 — End: 2018-09-15

## 2018-06-16 MED ORDER — OXYBUTYNIN CHLORIDE 5 MG TAB
5 mg | ORAL_TABLET | ORAL | 1 refills | Status: DC
Start: 2018-06-16 — End: 2019-03-09

## 2018-06-16 MED ORDER — PREGABALIN 50 MG CAP
50 mg | ORAL_CAPSULE | ORAL | 5 refills | Status: DC
Start: 2018-06-16 — End: 2018-09-04

## 2018-06-16 MED ORDER — OMEPRAZOLE 40 MG CAP, DELAYED RELEASE
40 mg | ORAL_CAPSULE | ORAL | 6 refills | Status: DC
Start: 2018-06-16 — End: 2018-09-04

## 2018-06-16 MED ORDER — EZETIMIBE 10 MG TAB
10 mg | ORAL_TABLET | ORAL | 1 refills | Status: DC
Start: 2018-06-16 — End: 2018-09-16

## 2018-06-16 NOTE — Progress Notes (Signed)
Letter mailed to address on file. Jaymason Ledesma M Teoman Giraud, LPN

## 2018-06-16 NOTE — Progress Notes (Signed)
Normal glucose, kidney and liver function  Blood counts are stable    Good!
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
## 2018-06-16 NOTE — Progress Notes (Signed)
Letter mailed to address on file. Tanya M Anderson, LPN
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
## 2018-06-23 ENCOUNTER — Other Ambulatory Visit: Admit: 2018-06-23 | Discharge: 2018-06-23 | Payer: MEDICARE | Primary: Family Medicine

## 2018-06-23 NOTE — Progress Notes (Signed)
Patient in office today for labs. Labs drawn no noted adverse reaction note. [name], LPN

## 2018-06-24 LAB — CBC WITH AUTO DIFFERENTIAL
Basophils %: 1 %
Basophils Absolute: 0 10*3/uL (ref 0.0–0.2)
Eosinophils %: 2 %
Eosinophils Absolute: 0.1 10*3/uL (ref 0.0–0.4)
Granulocyte Absolute Count: 0 10*3/uL (ref 0.0–0.1)
Hematocrit: 35.5 % (ref 34.0–46.6)
Hemoglobin: 11.4 g/dL (ref 11.1–15.9)
Immature Granulocytes: 0 %
Lymphocytes %: 10 %
Lymphocytes Absolute: 0.4 10*3/uL — ABNORMAL LOW (ref 0.7–3.1)
MCH: 23.8 pg — ABNORMAL LOW (ref 26.6–33.0)
MCHC: 32.1 g/dL (ref 31.5–35.7)
MCV: 74 fL — ABNORMAL LOW (ref 79–97)
Monocytes %: 11 %
Monocytes Absolute: 0.4 10*3/uL (ref 0.1–0.9)
Neutrophils %: 76 %
Neutrophils Absolute: 2.8 10*3/uL (ref 1.4–7.0)
Platelets: 268 10*3/uL (ref 150–450)
RBC: 4.8 x10E6/uL (ref 3.77–5.28)
RDW: 15 % (ref 12.3–15.4)
WBC: 3.7 10*3/uL (ref 3.4–10.8)

## 2018-06-24 LAB — COMPREHENSIVE METABOLIC PANEL
ALT: 16 IU/L (ref 0–32)
AST: 17 IU/L (ref 0–40)
Albumin/Globulin Ratio: 1.4 NA (ref 1.2–2.2)
Albumin: 3.8 g/dL (ref 3.2–4.6)
Alkaline Phosphatase: 74 IU/L (ref 39–117)
BUN: 13 mg/dL (ref 10–36)
Bun/Cre Ratio: 16 NA (ref 12–28)
CO2: 21 mmol/L (ref 20–29)
Calcium: 9.3 mg/dL (ref 8.7–10.3)
Chloride: 106 mmol/L (ref 96–106)
Creatinine: 0.8 mg/dL (ref 0.57–1.00)
EGFR IF NonAfrican American: 64 mL/min/{1.73_m2} (ref >59)
GFR African American: 74 mL/min/{1.73_m2} (ref >59)
Globulin, Total: 2.8 g/dL (ref 1.5–4.5)
Glucose: 87 mg/dL (ref 65–99)
Potassium: 4.6 mmol/L (ref 3.5–5.2)
Sodium: 143 mmol/L (ref 134–144)
Total Bilirubin: 0.3 mg/dL (ref 0.0–1.2)
Total Protein: 6.6 g/dL (ref 6.0–8.5)

## 2018-06-24 LAB — LIPID PANEL
Cholesterol, Total: 158 mg/dL (ref 100–199)
Cholesterol, total: 158 mg/dL (ref 100–199)
HDL Cholesterol: 57 mg/dL (ref >39)
HDL: 57 mg/dL (ref >39)
LDL Calculated: 87 mg/dL (ref 0–99)
LDL, calculated: 87 mg/dL (ref 0–99)
Triglyceride: 72 mg/dL (ref 0–149)
Triglycerides: 72 mg/dL (ref 0–149)
VLDL Cholesterol Calculated: 14 mg/dL (ref 5–40)
VLDL, calculated: 14 mg/dL (ref 5–40)

## 2018-06-24 LAB — METABOLIC PANEL, COMPREHENSIVE
A-G Ratio: 1.4 (ref 1.2–2.2)
ALT (SGPT): 16 IU/L (ref 0–32)
AST (SGOT): 17 IU/L (ref 0–40)
Albumin: 3.8 g/dL (ref 3.2–4.6)
Alk. phosphatase: 74 IU/L (ref 39–117)
BUN/Creatinine ratio: 16 (ref 12–28)
BUN: 13 mg/dL (ref 10–36)
Bilirubin, total: 0.3 mg/dL (ref 0.0–1.2)
CO2: 21 mmol/L (ref 20–29)
Calcium: 9.3 mg/dL (ref 8.7–10.3)
Chloride: 106 mmol/L (ref 96–106)
Creatinine: 0.8 mg/dL (ref 0.57–1.00)
GFR est AA: 74 mL/min/{1.73_m2} (ref >59)
GFR est non-AA: 64 mL/min/{1.73_m2} (ref >59)
GLOBULIN, TOTAL: 2.8 g/dL (ref 1.5–4.5)
Glucose: 87 mg/dL (ref 65–99)
Potassium: 4.6 mmol/L (ref 3.5–5.2)
Protein, total: 6.6 g/dL (ref 6.0–8.5)
Sodium: 143 mmol/L (ref 134–144)

## 2018-06-24 LAB — CBC WITH AUTOMATED DIFF
ABS. BASOPHILS: 0 10*3/uL (ref 0.0–0.2)
ABS. EOSINOPHILS: 0.1 10*3/uL (ref 0.0–0.4)
ABS. IMM. GRANS.: 0 10*3/uL (ref 0.0–0.1)
ABS. MONOCYTES: 0.4 10*3/uL (ref 0.1–0.9)
ABS. NEUTROPHILS: 2.8 10*3/uL (ref 1.4–7.0)
Abs Lymphocytes: 0.4 10*3/uL — ABNORMAL LOW (ref 0.7–3.1)
BASOPHILS: 1 %
EOSINOPHILS: 2 %
HCT: 35.5 % (ref 34.0–46.6)
HGB: 11.4 g/dL (ref 11.1–15.9)
IMMATURE GRANULOCYTES: 0 %
Lymphocytes: 10 %
MCH: 23.8 pg — ABNORMAL LOW (ref 26.6–33.0)
MCHC: 32.1 g/dL (ref 31.5–35.7)
MCV: 74 fL — ABNORMAL LOW (ref 79–97)
MONOCYTES: 11 %
NEUTROPHILS: 76 %
PLATELET: 268 10*3/uL (ref 150–450)
RBC: 4.8 x10E6/uL (ref 3.77–5.28)
RDW: 15 % (ref 12.3–15.4)
WBC: 3.7 10*3/uL (ref 3.4–10.8)

## 2018-07-08 MED ORDER — OMEPRAZOLE 40 MG CAP, DELAYED RELEASE
40 mg | ORAL_CAPSULE | ORAL | 0 refills | Status: DC
Start: 2018-07-08 — End: 2018-07-08

## 2018-07-11 MED ORDER — OMEPRAZOLE 40 MG CAP, DELAYED RELEASE
40 mg | ORAL_CAPSULE | ORAL | 0 refills | Status: DC
Start: 2018-07-11 — End: 2018-10-03

## 2018-07-24 ENCOUNTER — Encounter

## 2018-08-18 ENCOUNTER — Encounter: Attending: Surgery | Primary: Internal Medicine

## 2018-08-29 NOTE — Telephone Encounter (Signed)
Spoke with Kimberly [name] advised her provider will not be available for Kimberly [name] appt 09/02/2018 and we need to reschedule her appt.  She stated will call back to reschedule.

## 2018-09-02 ENCOUNTER — Encounter: Attending: Surgery | Primary: Internal Medicine

## 2018-09-04 ENCOUNTER — Ambulatory Visit: Admit: 2018-09-04 | Discharge: 2018-09-04 | Payer: MEDICARE | Attending: Neurology | Primary: Internal Medicine

## 2018-09-04 ENCOUNTER — Ambulatory Visit: Attending: Neurology | Primary: Family Medicine

## 2018-09-04 DIAGNOSIS — G629 Polyneuropathy, unspecified: Secondary | ICD-10-CM

## 2018-09-04 MED ORDER — PREGABALIN 100 MG CAP
100 mg | ORAL_CAPSULE | Freq: Two times a day (BID) | ORAL | 2 refills | Status: DC
Start: 2018-09-04 — End: 2018-10-03

## 2018-09-04 NOTE — Progress Notes (Signed)
Neurology Progress Note    NAME:  Kimberly [name]   DOB:   1923/11/03   MRN:   [id number]     Date/Time:  09/06/2018  Subjective:     Kimberly [name] is a 85 y.o. female here today for follow up for meningioma, status post resection, headache, leg pain, numbness and tingling sensation.  Patient was seen last about 2 years ago, today was accompanied by a healthcare aide.  Patient noted that she has been having a lot of pain, mostly in the legs, pain is described as sharp in nature consistent with patient up at night.  Because of this pain, patient had to go to some company that advertised treatment of pain pain without medication, patient was said to have been  to the seminar and after that paid down some money for treatment.  She is currently receiving treatment and patient does not see much improvement.  She still having difficulty ambulating with a right foot drop.   I will obtain EMG/nerve conduction of the lower extremity to evaluate the extent of nerve damage.  I will increase the Lyrica 100 mg p.o. twice daily.  Headache has improved some, according to patient, headache has not been as frequent as it used to be.  Medication has been helpful.  Headache is throbbing in nature mostly on one side that is the left side, associated with blurry vision.  She still experiences blurry vision, however, patient has not had good vision with the right.  Examining the patient, there is right lower quadrant visual cut.    She however, denies loss of consciousness dysphagia or odynophagia.  Denies any fall, incontinence or dysuria.  Review of Systems - General ROS: positive for  - fatigue and sleep disturbance  Psychological ROS: positive for - anxiety and sleep disturbances  Ophthalmic ROS: positive for - blurry vision, decreased vision and photophobia  ENT ROS: positive for - headaches, tinnitus and visual changes  Allergy and Immunology ROS: negative  Hematological and Lymphatic ROS: negative  Endocrine ROS: negative   Respiratory ROS: no cough, shortness of breath, or wheezing  Cardiovascular ROS: no chest pain or dyspnea on exertion  Gastrointestinal ROS: no abdominal pain, change in bowel habits, or black or bloody stools  Genito-Urinary ROS: no dysuria, trouble voiding, or hematuria  Musculoskeletal ROS: Muscle weakness, joint pains  Neurological ROS: Headaches, numbness and tingling sensation,dizziness.  Dermatological ROS: negative     Medications reviewed:  Current Outpatient Medications   Medication Sig Dispense Refill   ??? traMADol (ULTRAM) 50 mg tablet every eight (8) hours as needed. 1/2 tablet     ??? pregabalin (LYRICA) 100 mg capsule Take 1 Cap by mouth two (2) times a day. Max Daily Amount: 200 mg. 60 Cap 2   ??? omeprazole (PRILOSEC) 40 mg capsule TAKE 1 CAPSULE BY MOUTH DAILY 90 Cap 0   ??? ezetimibe (ZETIA) 10 mg tablet TAKE 1 TABLET BY MOUTH DAILY 90 Tab 1   ??? oxybutynin (DITROPAN) 5 mg tablet TAKE 1 TABLET BY MOUTH TWICE DAILY 180 Tab 1   ??? alendronate (FOSAMAX) 70 mg tablet TAKE 1 TABLET BY MOUTH EVERY 7 DAYS 4 Tab 5   ??? cilostazol (PLETAL) 100 mg tablet TAKE 1 TABLET BY MOUTH TWICE DAILY ON EMPTY STOMACH 180 Tab 0   ??? verapamil ER (CALAN-SR) 180 mg CR tablet TAKE 1 TABLET BY MOUTH EVERY NIGHT 90 Tab 0   ??? acetaminophen (TYLENOL) 500 mg tablet Take 1,000 mg by mouth  every six (6) hours as needed for Pain.     ??? omeprazole (PRILOSEC) 40 mg capsule TAKE 1 CAPSULE BY MOUTH DAILY 90 Cap 3   ??? estradiol (VAGIFEM) 10 mcg tab vaginal tablet INSERT 1 T VAGINALLY TWICE PER WEEK  3   ??? isosorbide dinitrate (ISORDIL) 10 mg tablet TAKE 1 TABLET BY MOUTH TWICE DAILY 180 Tab 0   ??? lisinopril (PRINIVIL) 10 mg tablet Take 1 Tab by mouth daily. 30 Tab 0   ??? polyethylene glycol (MIRALAX) 17 gram/dose powder TAKE 1 CAPFUL PO ONCE A DAY  11   ??? aspirin 81 mg chewable tablet Take 81 mg by mouth daily.          Objective:   Vitals:  Vitals:    09/04/18 1156   BP: 115/70   Pulse: 73   Resp: 18   Temp: 97.9 ??F (36.6 ??C)    TempSrc: Temporal   SpO2: 97%   Weight: 134 lb 6.4 oz (61 kg)   Height: 5\' 3"  (1.6 m)   PainSc:   2   PainLoc: Foot       Lab Data Reviewed:  Lab Results   Component Value Date/Time    WBC 3.7 06/16/2018 04:49 PM    HCT 35.5 06/16/2018 04:49 PM    HGB 11.4 06/16/2018 04:49 PM    PLATELET 268 06/16/2018 04:49 PM       Lab Results   Component Value Date/Time    Sodium 143 06/16/2018 04:49 PM    Potassium 4.6 06/16/2018 04:49 PM    Chloride 106 06/16/2018 04:49 PM    CO2 21 06/16/2018 04:49 PM    Glucose 87 06/16/2018 04:49 PM    BUN 13 06/16/2018 04:49 PM    Creatinine 0.80 06/16/2018 04:49 PM    Calcium 9.3 06/16/2018 04:49 PM       No components found for: TROPQUANT    No results found for: ANA      No results found for: HBA1C, HGBE8, HBA1CPOC, HBA1CEXT     Lab Results   Component Value Date/Time    Vitamin B12 668 09/04/2018 01:07 PM       No results found for: ANA, Phillip Heal, XBANA    Lab Results   Component Value Date/Time    Cholesterol, total 158 06/16/2018 04:49 PM    HDL Cholesterol 57 06/16/2018 04:49 PM    LDL, calculated 87 06/16/2018 04:49 PM    VLDL, calculated 14 06/16/2018 04:49 PM    Triglyceride 72 06/16/2018 04:49 PM         CT Results (recent):  Results from [facility] encounter on 05/15/17   CT HEAD WO CONT    Narrative EXAM:  CT HEAD WO CONT  Clinical history: Intractable headache  INDICATION:   intractable headache    COMPARISON: 11/30/2016.    CONTRAST:  None.    TECHNIQUE: Unenhanced CT of the head was performed using 5 mm images. Brain and  bone windows were generated.  CT dose reduction was achieved through use of a  standardized protocol tailored for this examination and automatic exposure  control for dose modulation.      FINDINGS:  The ventricles and sulci are normal in size, shape and configuration and  midline. Scattered hypodensities in the cerebral white matter.. There is no  intracranial hemorrhage, extra-axial collection,. Pituitary mass lesion likely   microadenoma.  Sulcal and ventricular prominence.. No acute infarct is  identified. The bone windows demonstrate no abnormalities. The visualized  portions of the paranasal sinuses and mastoid air cells are clear.      Impression IMPRESSION:     No acute intracranial process.    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           MRI Results (recent):  Results from [facility] encounter on 06/05/16   MRI BRAIN W WO CONT    Narrative INDICATION:  ADENOMA, HEADACCE     EXAMINATION:  MRI BRAIN, PITUITARY, W/WO CONTRAST    COMPARISON:  CT May 23, 2016    TECHNIQUE:  MR imaging of the brain was performed with particular attention to  the pituitary gland including sagittal T1, axial T1, T2, FLAIR, DWI/ADC; Pre and  post contrast dynamic imaging was performed using 7 mL of gadolinium with  particular attention to the sella turcica.    FINDINGS:      Ventricles:  Prominence of the ventricles, sulci and cisterns related to  underlying atrophy.    Intracranial Hemorrhage:  None.    Brain Parenchyma/Brainstem:  Mild chronic white matter disease in the  supratentorial brain. No acute infarction.  Basal Cisterns:  Normal.   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 This displaces normal pituitary tissue and infundibulum toward the  left. This abuts and slightly deforms the optic chiasm again just right of  midline. Mass extends toward the right cavernous sinus, though without definite  invasion  Flow Voids:  Normal.  Post Contrast:  No abnormal parenchymal or meningeal enhancement.  Additional Comments:  N/A.      Impression IMPRESSION:    1. 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  2. Mild chronic white matter disease with no acute abnormality         IR Results (recent):   No results found for this or any previous visit.    VAS/US Results (recent):  Results from Hospital Encounter encounter on 04/24/16   DUPLEX LOWER EXT VENOUS RIGHT    Narrative HISTORY: Right leg edema and pain.     PROCEDURE: Multiple real time duplex and color Doppler interrogation of the  right lower extremity deep venous system were performed.    FINDINGS: The veins are compressible and demonstrate normal phasicity and  augmentation. No filling defects are identified to suggest deep venous  thrombosis. The veins of the groin and thigh and knee and calf were evaluated.    However, there is noted to be a right suprapatellar joint effusion with debris  measuring 6.1 x 1.6 cm.      Impression IMPRESSION:    No evidence of deep venous thrombosis.    Left suprapatellar joint effusion with debris.         PHYSICAL EXAM:  General:    Alert, cooperative, no distress, appears stated age.     Head:   Normocephalic, without obvious abnormality, atraumatic.  Eyes:   Conjunctivae/corneas clear.  PERRLA  Nose:  Nares normal. No drainage or sinus tenderness.  Throat:    Lips, mucosa, and tongue normal.  No Thrush  Neck:  Supple, symmetrical,  no adenopathy, thyroid: non tender    no carotid bruit and no JVD.  Back:    Symmetric,  No CVA tenderness.  Lungs:   Clear to auscultation bilaterally.  No Wheezing or Rhonchi.  No rales.  Chest wall:  No tenderness or deformity. No Accessory muscle use.  Heart:   Regular rate and rhythm,  no murmur, rub or gallop.  Abdomen:   Soft, non-tender. Not distended.  Bowel sounds normal. No masses  Extremities: Extremities normal, atraumatic, No cyanosis.  No edema. No clubbing  Skin:     Texture, turgor normal. No rashes or lesions.  Not Jaundiced  Lymph nodes: Cervical, supraclavicular normal.  Psych:  Good insight.  Not depressed.  Not anxious or agitated.      NEUROLOGICAL EXAM:  Appearance:  The patient is well developed, well nourished, provides a  coherent history and is in no acute distress.   Mental Status: Oriented to time, place and person. Mood and affect appropriate.   Cranial Nerves:   Intact visual fields. Fundi are benign. PERLA, EOM's full, no nystagmus, no ptosis. Facial sensation is normal. Corneal reflexes are intact. Facial movement is symmetric. Hearing is normal bilaterally. Palate is midline with normal sternocleidomastoid and trapezius muscles are normal. Tongue is midline.   Motor:  5/5 strength in upper extremity and 4/5 left lower extremity proximal and distal muscles. Normal bulk and tone. No fasciculations.   Reflexes:   Deep tendon reflexes 2+/4 and symmetrical.   Sensory:    Dysesthesia to touch, pinprick and vibration.   Gait:   Unsteady gait.  Ambulates with cane   Tremor:   No tremor noted.   Cerebellar:  No cerebellar signs present.   Neurovascular:  Normal heart sounds and regular rhythm, peripheral pulses intact, and no carotid bruits.       Assesment  1. Neuropathy    - pregabalin (LYRICA) 100 mg capsule; Take 1 Cap by mouth two (2) times a day. Max Daily Amount: 200 mg.  Dispense: 60 Cap; Refill: 2  - EMG NCV MOTOR WITH F/WAVE PER NERVE; Future  - VITAMIN B12  - RHEUMATOID FACTOR, QL  - CK  - ANA, DIRECT, W/REFLEX    2. Paresthesia    - EMG NCV MOTOR WITH F/WAVE PER NERVE; Future  - VITAMIN B12  - RHEUMATOID FACTOR, QL  - CK  - HOMOCYSTEINE, PLASMA  - ANA, DIRECT, W/REFLEX    3. Right foot drop  EMG/nerve conduction study    4. Gait disorder  Physical therapy    5. Chronic pain syndrome  Continue management    ___________________________________________________  PLAN: Medication and plan discussed with patient and the health aide care giver      ICD-10-CM ICD-9-CM    1. Neuropathy G62.9 355.9 pregabalin (LYRICA) 100 mg capsule      EMG NCV MOTOR WITH F/WAVE PER NERVE      VITAMIN B12      RHEUMATOID FACTOR, QL      CK      ANA, DIRECT, W/REFLEX   2. Paresthesia R20.2 782.0 EMG NCV MOTOR WITH F/WAVE PER NERVE      VITAMIN B12       RHEUMATOID FACTOR, QL      CK      HOMOCYSTEINE, PLASMA      ANA, DIRECT, W/REFLEX   3. Right foot drop M21.371 736.79    4. Gait disorder R26.9 781.2    5. Chronic pain syndrome G89.4 338.4      Follow-up and Dispositions    ?? Return in about 3 months (around 12/04/2018).             ___________________________________________________    Attending Physician: Nelwyn Salisbury, MD

## 2018-09-04 NOTE — Patient Instructions (Signed)
All test results will be discussed at the next office visit.     Electromyogram (EMG) and Nerve Conduction Studies: About These Tests  What are they?  An electromyogram (EMG) measures the electrical activity of your muscles when you are not using them (at rest) and when you tighten them (muscle contraction).  Nerve conduction studies (NCS) measure how well and how fast the nerves can send electrical signals.  EMG and nerve conduction studies are often done together. If they are done together, the nerve conduction studies are done before the EMG.  Why are they done?  You may need an EMG to find diseases that damage your muscles or nerves or to find why you cannot move your muscles (paralysis), why they feel weak, or why they twitch.  You may need nerve conduction studies to find damage to the nerves that lead from the brain and spinal cord to the rest of the body (peripheral nervous system). Nerve conduction studies are often used to help find nerve disorders, such as carpal tunnel syndrome.  How can you prepare for these tests?   ?? Tell your doctors ALL the medicines, vitamins, supplements, and herbal remedies you take. Some medicines can affect the test results. You may need to stop taking some medicines before you have this test.   ?? ?? If you take aspirin or some other blood thinner, be sure to talk to your doctor. He or she will tell you if you should stop taking it before your test. Make sure that you understand exactly what your doctor wants you to do.   ?? ?? Wear loose-fitting clothing. You may be given a hospital gown to wear.   ?? ?? The electrodes for the test are attached to your skin. Your skin needs to be clean and free of sprays, oils, creams, and lotions.   What happens during the tests?  You lie on a table or bed or sit in a reclining chair so your muscles are relaxed.  For an EMG:  ?? Your doctor will insert a needle electrode into a muscle. This will  record the electrical activity while the muscle is at rest. You may feel a quick, sharp pain when the needle electrode is put into a muscle.  ?? Your doctor will ask you to tighten the same muscle slowly and steadily while the electrical activity is recorded.  ?? Your doctor may move the electrode to a different area of the muscle or a different muscle.  For nerve conduction studies:  ?? Your doctor will attach two types of electrodes to your skin.  ? One type of electrode is placed over a nerve and will give the nerve an electrical pulse.  ? The other type of electrode is placed over the muscle that the nerve controls. It will record how long it takes the muscle to react to the electrical pulse.  ?? You will be able to feel the electrical pulses. They are small shocks and are safe.  What else should you know about these tests?  ?? After an EMG, you may be sore and have a tingling feeling in your muscles for up to 2 days. You may have small bruises or swelling at the needle site.  ?? For an EMG, you may be asked to sign a consent form. Talk to your doctor about any concerns you have about the need for the test, its risks, how it will be done, or what the results will mean.  How long do  they take?  ?? An EMG may take 30 to 60 minutes.  ?? Nerve conduction tests may take from 15 minutes to 1 hour or more. It depends on how many nerves and muscles your doctor tests.  What happens after these tests?  ?? If any of the test areas are sore:  ? Put ice or a cold pack on the area for 10 to 20 minutes at a time. Put a thin cloth between the ice and your skin.  ? Take an over-the-counter pain medicine, such as acetaminophen (Tylenol), ibuprofen (Advil, Motrin), or naproxen (Aleve). Be safe with medicines. Read and follow all instructions on the label.  ?? You will probably be able to go home right away.  ?? You can go back to your usual activities right away.  When should you call for help?   Watch closely for changes in your health, and be sure to contact your doctor if:  ?? Muscle pain from an EMG test gets worse or you have swelling, tenderness, or pus at any of the needle sites.  ?? You have any problems that you think may be from the test.  ?? You have any questions about the test or have not received your results.  Follow-up care is a key part of your treatment and safety. Be sure to make and go to all appointments, and call your doctor if you are having problems. It's also a good idea to keep a list of the medicines you take. Ask your doctor when you can expect to have your test results.  Where can you learn more?  Go to [email or web address].  Enter [phone] in the search box to learn more about "Electromyogram (EMG) and Nerve Conduction Studies: About These Tests."  Current as of: October 31, 2017  Content Version: 12.2  ?? 2006-2019 [employer]. Care instructions adapted under license by [employer] (which disclaims liability or warranty for this information). If you have questions about a medical condition or this instruction, always ask your healthcare professional. Arnold any warranty or liability for your use of this information.

## 2018-09-04 NOTE — Progress Notes (Signed)
Neurology Progress Note    NAME:  Kimberly [name]   DOB:   Mar 23, 1924   MRN:   [id number]     Date/Time:  09/06/2018  Subjective:     Kimberly [name] is a 85 y.o. female here today for follow up for meningioma, status post resection, headache, leg pain, numbness and tingling sensation.  Patient was seen last about 2 years ago, today was accompanied by a healthcare aide.  Patient noted that she has been having a lot of pain, mostly in the legs, pain is described as sharp in nature consistent with patient up at night.  Because of this pain, patient had to go to some company that advertised treatment of pain pain without medication, patient was said to have been  to the seminar and after that paid down some money for treatment.  She is currently receiving treatment and patient does not see much improvement.  She still having difficulty ambulating with a right foot drop.   I will obtain EMG/nerve conduction of the lower extremity to evaluate the extent of nerve damage.  I will increase the Lyrica 100 mg p.o. twice daily.  Headache has improved some, according to patient, headache has not been as frequent as it used to be.  Medication has been helpful.  Headache is throbbing in nature mostly on one side that is the left side, associated with blurry vision.  She still experiences blurry vision, however, patient has not had good vision with the right.  Examining the patient, there is right lower quadrant visual cut.    She however, denies loss of consciousness dysphagia or odynophagia.  Denies any fall, incontinence or dysuria.  Review of Systems - General ROS: positive for  - fatigue and sleep disturbance  Psychological ROS: positive for - anxiety and sleep disturbances  Ophthalmic ROS: positive for - blurry vision, decreased vision and photophobia  ENT ROS: positive for - headaches, tinnitus and visual changes  Allergy and Immunology ROS: negative  Hematological and Lymphatic ROS: negative  Endocrine ROS: negative  Respiratory  ROS: no cough, shortness of breath, or wheezing  Cardiovascular ROS: no chest pain or dyspnea on exertion  Gastrointestinal ROS: no abdominal pain, change in bowel habits, or black or bloody stools  Genito-Urinary ROS: no dysuria, trouble voiding, or hematuria  Musculoskeletal ROS: Muscle weakness, joint pains  Neurological ROS: Headaches, numbness and tingling sensation,dizziness.  Dermatological ROS: negative     Medications reviewed:  Current Outpatient Medications   Medication Sig Dispense Refill   ??? traMADol (ULTRAM) 50 mg tablet every eight (8) hours as needed. 1/2 tablet     ??? pregabalin (LYRICA) 100 mg capsule Take 1 Cap by mouth two (2) times a day. Max Daily Amount: 200 mg. 60 Cap 2   ??? omeprazole (PRILOSEC) 40 mg capsule TAKE 1 CAPSULE BY MOUTH DAILY 90 Cap 0   ??? ezetimibe (ZETIA) 10 mg tablet TAKE 1 TABLET BY MOUTH DAILY 90 Tab 1   ??? oxybutynin (DITROPAN) 5 mg tablet TAKE 1 TABLET BY MOUTH TWICE DAILY 180 Tab 1   ??? alendronate (FOSAMAX) 70 mg tablet TAKE 1 TABLET BY MOUTH EVERY 7 DAYS 4 Tab 5   ??? cilostazol (PLETAL) 100 mg tablet TAKE 1 TABLET BY MOUTH TWICE DAILY ON EMPTY STOMACH 180 Tab 0   ??? verapamil ER (CALAN-SR) 180 mg CR tablet TAKE 1 TABLET BY MOUTH EVERY NIGHT 90 Tab 0   ??? acetaminophen (TYLENOL) 500 mg tablet Take 1,000 mg by mouth  every six (6) hours as needed for Pain.     ??? omeprazole (PRILOSEC) 40 mg capsule TAKE 1 CAPSULE BY MOUTH DAILY 90 Cap 3   ??? estradiol (VAGIFEM) 10 mcg tab vaginal tablet INSERT 1 T VAGINALLY TWICE PER WEEK  3   ??? isosorbide dinitrate (ISORDIL) 10 mg tablet TAKE 1 TABLET BY MOUTH TWICE DAILY 180 Tab 0   ??? lisinopril (PRINIVIL) 10 mg tablet Take 1 Tab by mouth daily. 30 Tab 0   ??? polyethylene glycol (MIRALAX) 17 gram/dose powder TAKE 1 CAPFUL PO ONCE A DAY  11   ??? aspirin 81 mg chewable tablet Take 81 mg by mouth daily.          Objective:   Vitals:  Vitals:    09/04/18 1156   BP: 115/70   Pulse: 73   Resp: 18   Temp: 97.9 ??F (36.6 ??C)   TempSrc: Temporal   SpO2: 97%    Weight: 134 lb 6.4 oz (61 kg)   Height: 5\' 3"  (1.6 m)   PainSc:   2   PainLoc: Foot       Lab Data Reviewed:  Lab Results   Component Value Date/Time    WBC 3.7 06/16/2018 04:49 PM    HCT 35.5 06/16/2018 04:49 PM    HGB 11.4 06/16/2018 04:49 PM    PLATELET 268 06/16/2018 04:49 PM       Lab Results   Component Value Date/Time    Sodium 143 06/16/2018 04:49 PM    Potassium 4.6 06/16/2018 04:49 PM    Chloride 106 06/16/2018 04:49 PM    CO2 21 06/16/2018 04:49 PM    Glucose 87 06/16/2018 04:49 PM    BUN 13 06/16/2018 04:49 PM    Creatinine 0.80 06/16/2018 04:49 PM    Calcium 9.3 06/16/2018 04:49 PM       No components found for: TROPQUANT    No results found for: ANA      No results found for: HBA1C, HGBE8, HBA1CPOC, HBA1CEXT     Lab Results   Component Value Date/Time    Vitamin B12 668 09/04/2018 01:07 PM       No results found for: ANA, Phillip Heal, XBANA    Lab Results   Component Value Date/Time    Cholesterol, total 158 06/16/2018 04:49 PM    HDL Cholesterol 57 06/16/2018 04:49 PM    LDL, calculated 87 06/16/2018 04:49 PM    VLDL, calculated 14 06/16/2018 04:49 PM    Triglyceride 72 06/16/2018 04:49 PM         CT Results (recent):  Results from [facility] encounter on 05/15/17   CT HEAD WO CONT    Narrative EXAM:  CT HEAD WO CONT  Clinical history: Intractable headache  INDICATION:   intractable headache    COMPARISON: 11/30/2016.    CONTRAST:  None.    TECHNIQUE: Unenhanced CT of the head was performed using 5 mm images. Brain and  bone windows were generated.  CT dose reduction was achieved through use of a  standardized protocol tailored for this examination and automatic exposure  control for dose modulation.      FINDINGS:  The ventricles and sulci are normal in size, shape and configuration and  midline. Scattered hypodensities in the cerebral white matter.. There is no  intracranial hemorrhage, extra-axial collection,. Pituitary mass lesion likely  microadenoma.  Sulcal and ventricular  prominence.. No acute infarct is  identified. The bone windows demonstrate no abnormalities. The visualized  portions of the paranasal sinuses and mastoid air cells are clear.      Impression IMPRESSION:     No acute intracranial process.    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           MRI Results (recent):  Results from [facility] encounter on 06/05/16   MRI BRAIN W WO CONT    Narrative INDICATION:  ADENOMA, HEADACCE     EXAMINATION:  MRI BRAIN, PITUITARY, W/WO CONTRAST    COMPARISON:  CT May 23, 2016    TECHNIQUE:  MR imaging of the brain was performed with particular attention to  the pituitary gland including sagittal T1, axial T1, T2, FLAIR, DWI/ADC; Pre and  post contrast dynamic imaging was performed using 7 mL of gadolinium with  particular attention to the sella turcica.    FINDINGS:      Ventricles:  Prominence of the ventricles, sulci and cisterns related to  underlying atrophy.    Intracranial Hemorrhage:  None.    Brain Parenchyma/Brainstem:  Mild chronic white matter disease in the  supratentorial brain. No acute infarction.  Basal Cisterns:  Normal.   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 This displaces normal pituitary tissue and infundibulum toward the  left. This abuts and slightly deforms the optic chiasm again just right of  midline. Mass extends toward the right cavernous sinus, though without definite  invasion  Flow Voids:  Normal.  Post Contrast:  No abnormal parenchymal or meningeal enhancement.  Additional Comments:  N/A.      Impression IMPRESSION:    1. 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  2. Mild chronic white matter disease with no acute abnormality         IR Results (recent):  No results found for this or any previous  visit.    VAS/US Results (recent):  Results from Hospital Encounter encounter on 04/24/16   DUPLEX LOWER EXT VENOUS RIGHT    Narrative HISTORY: Right leg edema and pain.     PROCEDURE: Multiple real time duplex and color Doppler interrogation of the  right lower extremity deep venous system were performed.    FINDINGS: The veins are compressible and demonstrate normal phasicity and  augmentation. No filling defects are identified to suggest deep venous  thrombosis. The veins of the groin and thigh and knee and calf were evaluated.    However, there is noted to be a right suprapatellar joint effusion with debris  measuring 6.1 x 1.6 cm.      Impression IMPRESSION:    No evidence of deep venous thrombosis.    Left suprapatellar joint effusion with debris.         PHYSICAL EXAM:  General:    Alert, cooperative, no distress, appears stated age.     Head:   Normocephalic, without obvious abnormality, atraumatic.  Eyes:   Conjunctivae/corneas clear.  PERRLA  Nose:  Nares normal. No drainage or sinus tenderness.  Throat:    Lips, mucosa, and tongue normal.  No Thrush  Neck:  Supple, symmetrical,  no adenopathy, thyroid: non tender    no carotid bruit and no JVD.  Back:    Symmetric,  No CVA tenderness.  Lungs:   Clear to auscultation bilaterally.  No Wheezing or Rhonchi.  No rales.  Chest wall:  No tenderness or deformity. No Accessory muscle use.  Heart:   Regular rate and rhythm,  no murmur, rub or gallop.  Abdomen:   Soft, non-tender. Not distended.  Bowel sounds normal. No masses  Extremities: Extremities normal, atraumatic, No cyanosis.  No edema. No clubbing  Skin:     Texture, turgor normal. No rashes or lesions.  Not Jaundiced  Lymph nodes: Cervical, supraclavicular normal.  Psych:  Good insight.  Not depressed.  Not anxious or agitated.      NEUROLOGICAL EXAM:  Appearance:  The patient is well developed, well nourished, provides a coherent history and is in no acute distress.   Mental Status: Oriented to time,  place and person. Mood and affect appropriate.   Cranial Nerves:   Intact visual fields. Fundi are benign. PERLA, EOM's full, no nystagmus, no ptosis. Facial sensation is normal. Corneal reflexes are intact. Facial movement is symmetric. Hearing is normal bilaterally. Palate is midline with normal sternocleidomastoid and trapezius muscles are normal. Tongue is midline.   Motor:  5/5 strength in upper extremity and 4/5 left lower extremity proximal and distal muscles. Normal bulk and tone. No fasciculations.   Reflexes:   Deep tendon reflexes 2+/4 and symmetrical.   Sensory:    Dysesthesia to touch, pinprick and vibration.   Gait:   Unsteady gait.  Ambulates with cane   Tremor:   No tremor noted.   Cerebellar:  No cerebellar signs present.   Neurovascular:  Normal heart sounds and regular rhythm, peripheral pulses intact, and no carotid bruits.       Assesment  1. Neuropathy    - pregabalin (LYRICA) 100 mg capsule; Take 1 Cap by mouth two (2) times a day. Max Daily Amount: 200 mg.  Dispense: 60 Cap; Refill: 2  - EMG NCV MOTOR WITH F/WAVE PER NERVE; Future  - VITAMIN B12  - RHEUMATOID FACTOR, QL  - CK  - ANA, DIRECT, W/REFLEX    2. Paresthesia    - EMG NCV MOTOR WITH F/WAVE PER NERVE; Future  - VITAMIN B12  - RHEUMATOID FACTOR, QL  - CK  - HOMOCYSTEINE, PLASMA  - ANA, DIRECT, W/REFLEX    3. Right foot drop  EMG/nerve conduction study    4. Gait disorder  Physical therapy    5. Chronic pain syndrome  Continue management    ___________________________________________________  PLAN: Medication and plan discussed with patient and the health aide care giver      ICD-10-CM ICD-9-CM    1. Neuropathy G62.9 355.9 pregabalin (LYRICA) 100 mg capsule      EMG NCV MOTOR WITH F/WAVE PER NERVE      VITAMIN B12      RHEUMATOID FACTOR, QL      CK      ANA, DIRECT, W/REFLEX   2. Paresthesia R20.2 782.0 EMG NCV MOTOR WITH F/WAVE PER NERVE      VITAMIN B12      RHEUMATOID FACTOR, QL      CK      HOMOCYSTEINE, PLASMA      ANA, DIRECT,  W/REFLEX   3. Right foot drop M21.371 736.79    4. Gait disorder R26.9 781.2    5. Chronic pain syndrome G89.4 338.4      Follow-up and Dispositions    ?? Return in about 3 months (around 12/04/2018).             ___________________________________________________    Attending Physician: Nelwyn Salisbury, MD

## 2018-09-05 LAB — RHEUMATOID FACTOR, QUALITATIVE: Rheumatoid Factor: <10 IU/mL (ref 0.0–13.9)

## 2018-09-05 LAB — CK
Creatine Kinase,Total: 138 U/L (ref 24–173)
Total CK: 138 U/L (ref 24–173)

## 2018-09-05 LAB — ANA, DIRECT, W/REFLEX
ANA, DIRECT: NEGATIVE
Antinuclear Antibodies Direct: NEGATIVE

## 2018-09-05 LAB — HOMOCYSTEINE, PLASMA
HOMOCYSTEINE, PLASMA: 10.2 umol/L (ref 0.0–21.3)
Homocysteine, plasma: 10.2 umol/L (ref 0.0–21.3)

## 2018-09-05 LAB — VITAMIN B12
Vitamin B-12: 668 pg/mL (ref 232–1245)
Vitamin B12: 668 pg/mL (ref 232–1245)

## 2018-09-05 LAB — RHEUMATOID FACTOR, QL: Rheumatoid factor: <10 IU/mL (ref 0.0–13.9)

## 2018-09-10 MED ORDER — VERAPAMIL ER 180 MG TAB
180 mg | ORAL_TABLET | ORAL | 0 refills | Status: DC
Start: 2018-09-10 — End: 2018-12-15

## 2018-09-16 ENCOUNTER — Ambulatory Visit: Admit: 2018-09-16 | Discharge: 2018-09-16 | Payer: MEDICARE | Primary: Internal Medicine

## 2018-09-16 ENCOUNTER — Ambulatory Visit: Primary: Family Medicine

## 2018-09-16 DIAGNOSIS — G629 Polyneuropathy, unspecified: Secondary | ICD-10-CM

## 2018-09-16 MED ORDER — EZETIMIBE 10 MG TAB
10 mg | ORAL_TABLET | ORAL | 1 refills | Status: DC
Start: 2018-09-16 — End: 2019-03-22

## 2018-09-16 MED ORDER — CILOSTAZOL 100 MG TAB
100 mg | ORAL_TABLET | ORAL | 0 refills | Status: DC
Start: 2018-09-16 — End: 2018-12-19

## 2018-09-16 NOTE — Procedures (Signed)
[facility]  [address] [place] [address]  p: [phone]  f: [phone]    Test Date:  09/16/2018    Patient: Kimberly [name] DOB: 08-Mar-1924 Physician: Dr.Aralu   Sex: Female Height: 5\' 3"  Ref Phys: Dr.Aralu   ID#: [id number] Weight: 134 lbs. Technician: Brunilda Payor     Patient Complaints:  b/l le    Medications:      Patient History / Exam:      NCV & EMG Findings:  Evaluation of the right tibial motor nerve showed reduced amplitude (0.8 mV).  The left Sup Fibular sensory and the right Sup Fibular sensory nerves showed no response (14 cm).  The left sural sensory and the right sural sensory nerves showed no response (Calf).  All remaining nerves (as indicated in the following tables) were within normal limits.  Left vs. Right side comparison data for the tibial motor nerve indicates abnormal L-R amplitude difference (87.3 %).  All remaining left vs. right side differences were within normal limits.      All examined muscles (as indicated in the following table) showed no evidence of electrical instability.      Impression:      Recommendations:      ___________________________  Dr.Aralu        Nerve Conduction Studies  Anti Sensory Summary Table     Stim Site NR Peak (ms) Norm Peak (ms) P-T Amp (??V) Norm P-T Amp Site1 Site2 Delta-P (ms) Dist (cm) Vel (m/s) Norm Vel (m/s)   Left Sup Fibular Anti Sensory (Ant Lat Mall)  34.6??C   14 cm NR  <4.4  >5.0 14 cm Ant Lat Mall  14.0  >32   Right Sup Fibular Anti Sensory (Ant Lat Mall)  35.7??C   14 cm NR  <4.4  >5.0 14 cm Ant Lat Mall  14.0  >32   Left Sural Anti Sensory (Lat Mall)  33.9??C   Calf NR  <4.0  >5.0 Calf Lat Mall  14.0  >35   Right Sural Anti Sensory (Lat Mall)  35.6??C   Calf NR  <4.0  >5.0 Calf Lat Mall  14.0  >35     Motor Summary Table     Stim Site NR Onset (ms) Norm Onset (ms) O-P Amp (mV) Norm O-P Amp Site1 Site2 Delta-0 (ms) Dist (cm) Vel (m/s) Norm Vel (m/s)    Left Fibular Motor (Ext Dig Brev)  35.1??C   Ankle    4.3 <6.1 3.0 >2.5 B Fib Ankle 6.3 32.0 51 >38   B Fib    10.6  3.0  Poplt B Fib 2.1 10.0 48 >40   Poplt    12.7  2.4          Right Fibular Motor (Ext Dig Brev)  35.8??C   Ankle    4.0 <6.1 3.3 >2.5 B Fib Ankle 6.8 30.0 44 >38   B Fib    10.8  2.2  Poplt B Fib 1.5 10.0 67 >40   Poplt    12.3  1.9          Left Tibial Motor (Abd Hall Brev)  35.4??C   Ankle    5.3 <6.1 6.3 >3.0 Knee Ankle 7.0 38.0 54 >35   Knee    12.3  5.8          Right Tibial Motor (Abd Hall Brev)  35.9??C   Ankle    4.1 <6.1 0.8 >3.0 Knee  Ankle 6.8 39.0 57 >35   Knee    10.9  1.1            EMG+     Side Muscle Nerve Root Ins Act Fibs Psw Amp Dur Poly Recrt Int Fraser Din Comment   Right Abd Poll Brev Median C8-T1 Nml Nml Nml Nml Nml 0 Nml Nml    Right VastusMed Femoral L2-4 Nml Nml Nml Nml Nml 0 Nml Nml    Right VastusLat Femoral L2-4 Nml Nml Nml Nml Nml 0 Nml Nml    Right QuadratusFem QuadFemoris L4-5, S1 Nml Nml Nml Nml Nml 0 Nml Nml    Right AntTibialis Dp Br Fibular L4-5 Nml Nml Nml Nml Nml 0 Nml Nml    Right Fibularis Long Sup Br Fibular L5-S1 Nml Nml Nml Nml Nml 0 Nml Nml        Nerve Conduction Studies  Anti Sensory Left/Right Comparison     Stim Site L Lat (ms) R Lat (ms) L-R Lat (ms) L Amp (??V) R Amp (??V) L-R Amp (%) Site1 Site2 L Vel (m/s) R Vel (m/s) L-R Vel (m/s)   Sup Fibular Anti Sensory (Ant Lat Mall)  34.6??C   14 cm       14 cm Ant Lat Mall      Sural Anti Sensory (Lat Mall)  33.9??C   Calf       Calf Lat Mall        Motor Left/Right Comparison     Stim Site L Lat (ms) R Lat (ms) L-R Lat (ms) L Amp (mV) R Amp (mV) L-R Amp (%) Site1 Site2 L Vel (m/s) R Vel (m/s) L-R Vel (m/s)   Fibular Motor (Ext Dig Brev)  35.1??C   Ankle 4.3 4.0 0.3 3.0 3.3 9.1 B Fib Ankle 51 44 7   B Fib 10.6 10.8 0.2 3.0 2.2 26.7 Poplt B Fib 48 67 19   Poplt 12.7 12.3 0.4 2.4 1.9 20.8        Tibial Motor (Abd Hall Brev)  35.4??C   Ankle 5.3 4.1 1.2 6.3 0.8 87.3 Knee Ankle 54 57 3   Knee 12.3 10.9 1.4 5.8 1.1 81.0               Waveforms:

## 2018-09-16 NOTE — Progress Notes (Signed)
[facility]  [address] [place] [address]  p: [phone]  f: [phone]    Test Date:  09/16/2018    Patient: Kimberly [name] DOB: 19-Aug-1923 Physician: Dr.Aralu   Sex: Female Height: 5\' 3"  Ref Phys: Dr.Aralu   ID#: [id number] Weight: 134 lbs. Technician: Brunilda Payor     Patient Complaints:  Weakness of the lower extremity, difficulty walking.    Medications: See chart      Patient History / Exam: This is a 85 year old right-handed black female who is being evaluated for bilateral foot drop worse in the right than the left, difficulty walking.      NCV & EMG Findings:  Evaluation of the right tibial motor nerve showed reduced amplitude (0.8 mV).  The left Sup Fibular sensory and the right Sup Fibular sensory nerves showed no response (14 cm).  The left sural sensory and the right sural sensory nerves showed no response (Calf).  All remaining nerves (as indicated in the following tables) were within normal limits.  Left vs. Right side comparison data for the tibial motor nerve indicates abnormal L-R amplitude difference (87.3 %).  All remaining left vs. right side differences were within normal limits.      All examined muscles (as indicated in the following table) showed no evidence of electrical instability.      Impression: This is a grossly abnormal study.  There is electrodiagnostic evidence of severe sensorimotor neuropathy bilateral lower extremity, with evidence of compressive neuropathy consistent with foot drop bilaterally.  Etiology yet to be determined.  Radiculopathy cannot be excluded.      Recommendations: Neuro imaging of lumbosacral spine suggested.      ___________________________  Dr.Aralu        Nerve Conduction Studies  Anti Sensory Summary Table     Stim Site NR Peak (ms) Norm Peak (ms) P-T Amp (??V) Norm P-T Amp Site1 Site2 Delta-P (ms) Dist (cm) Vel (m/s) Norm Vel (m/s)   Left Sup Fibular Anti Sensory (Ant Lat Mall)  34.6??C    14 cm NR  <4.4  >5.0 14 cm Ant Lat Mall  14.0  >32   Right Sup Fibular Anti Sensory (Ant Lat Mall)  35.7??C   14 cm NR  <4.4  >5.0 14 cm Ant Lat Mall  14.0  >32   Left Sural Anti Sensory (Lat Mall)  33.9??C   Calf NR  <4.0  >5.0 Calf Lat Mall  14.0  >35   Right Sural Anti Sensory (Lat Mall)  35.6??C   Calf NR  <4.0  >5.0 Calf Lat Mall  14.0  >35     Motor Summary Table     Stim Site NR Onset (ms) Norm Onset (ms) O-P Amp (mV) Norm O-P Amp Site1 Site2 Delta-0 (ms) Dist (cm) Vel (m/s) Norm Vel (m/s)   Left Fibular Motor (Ext Dig Brev)  35.1??C   Ankle    4.3 <6.1 3.0 >2.5 B Fib Ankle 6.3 32.0 51 >38   B Fib    10.6  3.0  Poplt B Fib 2.1 10.0 48 >40   Poplt    12.7  2.4          Right Fibular Motor (Ext Dig Brev)  35.8??C   Ankle    4.0 <6.1 3.3 >2.5 B Fib Ankle 6.8 30.0 44 >38   B Fib    10.8  2.2  Poplt B Fib 1.5 10.0 67 >40   Poplt  12.3  1.9          Left Tibial Motor (Abd Hall Brev)  35.4??C   Ankle    5.3 <6.1 6.3 >3.0 Knee Ankle 7.0 38.0 54 >35   Knee    12.3  5.8          Right Tibial Motor (Abd Hall Brev)  35.9??C   Ankle    4.1 <6.1 0.8 >3.0 Knee Ankle 6.8 39.0 57 >35   Knee    10.9  1.1            EMG+     Side Muscle Nerve Root Ins Act Fibs Psw Amp Dur Poly Recrt Int Fraser Din Comment   Right Abd Poll Brev Median C8-T1 Nml Nml Nml Nml Nml 0 Nml Nml    Right VastusMed Femoral L2-4 Nml Nml Nml Nml Nml 0 Nml Nml    Right VastusLat Femoral L2-4 Nml Nml Nml Nml Nml 0 Nml Nml    Right QuadratusFem QuadFemoris L4-5, S1 Nml Nml Nml Nml Nml 0 Nml Nml    Right AntTibialis Dp Br Fibular L4-5 Nml Nml Nml Nml Nml 0 Nml Nml    Right Fibularis Long Sup Br Fibular L5-S1 Nml Nml Nml Nml Nml 0 Nml Nml        Nerve Conduction Studies  Anti Sensory Left/Right Comparison     Stim Site L Lat (ms) R Lat (ms) L-R Lat (ms) L Amp (??V) R Amp (??V) L-R Amp (%) Site1 Site2 L Vel (m/s) R Vel (m/s) L-R Vel (m/s)   Sup Fibular Anti Sensory (Ant Lat Mall)  34.6??C   14 cm       14 cm Ant Lat Mall      Sural Anti Sensory (Lat Mall)  33.9??C    Calf       Calf Lat Mall        Motor Left/Right Comparison     Stim Site L Lat (ms) R Lat (ms) L-R Lat (ms) L Amp (mV) R Amp (mV) L-R Amp (%) Site1 Site2 L Vel (m/s) R Vel (m/s) L-R Vel (m/s)   Fibular Motor (Ext Dig Brev)  35.1??C   Ankle 4.3 4.0 0.3 3.0 3.3 9.1 B Fib Ankle 51 44 7   B Fib 10.6 10.8 0.2 3.0 2.2 26.7 Poplt B Fib 48 67 19   Poplt 12.7 12.3 0.4 2.4 1.9 20.8        Tibial Motor (Abd Hall Brev)  35.4??C   Ankle 5.3 4.1 1.2 6.3 0.8 87.3 Knee Ankle 54 57 3   Knee 12.3 10.9 1.4 5.8 1.1 81.0              Waveforms:

## 2018-09-16 NOTE — Progress Notes (Signed)
Progress  Notes by Kimberly [name] at 09/16/18 1400                Kimberly [name]: Gareth [name]  Service: --  Kimberly [name] Type: Medical Assistant       Filed: 09/24/18 0518  Encounter Date: 09/16/2018  Status: Signed          Kimberly [name]: Kimberly Him, MD (Kimberly [name])                 [facility]   [facility]   [address]   [place], [place] [address]   p: [phone]  f: [phone]      Test Date:  09/16/2018             Patient:  Kimberly [name]  DOB:  [birth year]  Kimberly [name]:  Kimberly [name]            Sex:  Female  Height:  5\' 3"   Ref Phys:  Kimberly [name]            ID#:  [id number]  Weight:  134 lbs.  Kimberly [name]:  Kimberly [name]        Patient Complaints:   Weakness of the lower extremity, difficulty walking.      Medications: See chart         Patient History / Exam: This is a 85 year old right-handed black female who is being evaluated for bilateral foot drop worse in  the right than the left, difficulty walking.         NCV & EMG Findings:   Evaluation of the right tibial motor nerve showed reduced amplitude (0.8 mV).  The left Sup Fibular sensory and the right Sup Fibular sensory nerves showed no response (14 cm).  The left sural sensory and the right sural sensory nerves showed no response  (Calf).  All remaining nerves (as indicated in the following tables) were within normal limits.  Left vs. Right side comparison data for the tibial motor nerve indicates abnormal L-R amplitude difference (87.3 %).  All remaining left vs. right side differences  were within normal limits.        All examined muscles (as indicated in the following table) showed no evidence of electrical instability.        Impression: This is a grossly abnormal study.  There is electrodiagnostic evidence of severe sensorimotor neuropathy bilateral  lower extremity, with evidence of compressive neuropathy consistent with foot drop bilaterally.  Etiology yet to be determined.  Radiculopathy cannot be excluded.          Recommendations: Neuro imaging of lumbosacral spine suggested.         ___________________________   Kimberly [name]           Nerve Conduction Studies   Anti Sensory Summary Table                    Stim Site  NR  Peak (ms)  Norm Peak (ms)  P-T Amp (V)  Norm P-T Amp  Site1  Site2  Delta-P (ms)  Dist (cm)  Vel (m/s)  Norm Vel (m/s)       Left Sup Fibular Anti Sensory (Ant Lat Mall)  34.6C                  14 cm  NR    <4.4    >5.0  14 cm  Ant Lat Mall    14.0    >32  Right Sup Fibular Anti Sensory (Ant Lat Mall)  35.7C                  14 cm  NR    <4.4    >5.0  14 cm  Ant Lat Mall    14.0    >32       Left Sural Anti Sensory (Lat Mall)  33.9C                  Calf  NR    <4.0    >5.0  Calf  Lat Mall    14.0    >35       Right Sural Anti Sensory (Lat Mall)  35.6C                  Calf  NR    <4.0    >5.0  Calf  Lat Mall    14.0    >35        Motor Summary Table                    Stim Site  NR  Onset (ms)  Norm Onset (ms)  O-P Amp (mV)  Norm O-P Amp  Site1  Site2  Delta-0 (ms)  Dist (cm)  Vel (m/s)  Norm Vel (m/s)       Left Fibular Motor (Ext Dig Brev)  35.1C                  Ankle      4.3  <6.1  3.0  >2.5  B Fib  Ankle  6.3  32.0  51  >38     B Fib      10.6    3.0    Poplt  B Fib  2.1  10.0  48  >40                  Poplt      12.7    2.4                     Right Fibular Motor (Ext Dig Brev)  35.8C                  Ankle      4.0  <6.1  3.3  >2.5  B Fib  Ankle  6.8  30.0  44  >38     B Fib      10.8    2.2    Poplt  B Fib  1.5  10.0  67  >40                  Poplt      12.3    1.9                     Left Tibial Motor (Abd Hall Brev)  35.4C                  Ankle      5.3  <6.1  6.3  >3.0  Knee  Ankle  7.0  38.0  54  >35                  Knee      12.3    5.8                     Right Tibial Motor (Abd Hall Brev)  35.9C                  Ankle      4.1  <6.1  0.8  >3.0  Knee  Ankle  6.8  39.0  57  >35                  Knee      10.9    1.1                      EMG+                     Side  Muscle  Nerve   Root  Ins Act  Fibs  Psw  Amp  Dur  Poly  Recrt  [name]  Comment                   Right  Abd Poll Brev  Median  C8-T1  Nml  Nml  Nml  Nml  Nml  0  Nml  Nml       Right  VastusMed  Femoral  L2-4  Nml  Nml  Nml  Nml  Nml  0  Nml  Nml       Right  VastusLat  Femoral  L2-4  Nml  Nml  Nml  Nml  Nml  0  Nml  Nml       Right  QuadratusFem  QuadFemoris  L4-5, S1  Nml  Nml  Nml  Nml  Nml  0  Nml  Nml       Right  AntTibialis  Dp Br Fibular  L4-5  Nml  Nml  Nml  Nml  Nml  0  Nml  Nml                     Right  Fibularis Long  Sup Br Fibular  L5-S1  Nml  Nml  Nml  Nml  Nml  0  Nml  Nml             Nerve Conduction Studies   Anti Sensory Left/Right Comparison                    Stim Site  L Lat (ms)  R Lat (ms)  L-R Lat (ms)  L Amp (V)  R Amp (V)  L-R Amp (%)  Site1  Site2  L Vel (m/s)  R Vel (m/s)  L-R Vel (m/s)       Sup Fibular Anti Sensory (Ant Lat Mall)  34.6C                  14 cm              14 cm  Ant Lat Mall             Sural Anti Sensory (Lat Mall)  33.9C                  Calf              Calf  Lat Mall              Motor Left/Right Comparison                    Stim Site  L Lat (ms)  R Lat (ms)  L-R Lat (ms)  L Amp (mV)  R Amp (mV)  L-R Amp (%)  Site1  Site2  L Vel (m/s)  R Vel (m/s)  L-R Vel (m/s)       Fibular Motor (Ext Dig Brev)  35.1C                  Ankle  4.3  4.0  0.3  3.0  3.3  9.1  B Fib  Ankle  51  44  7     B Fib  10.6  10.8  0.2  3.0  2.2  26.7  Poplt  B Fib  48  67  19                  Poplt  12.7  12.3  0.4  2.4  1.9  20.8                 Tibial Motor (Abd Hall Brev)  35.4C                  Ankle  5.3  4.1  1.2  6.3  0.8  87.3  Knee  Ankle  54  57  3                  Knee  12.3  10.9  1.4  5.8  1.1  81.0                       Waveforms:

## 2018-09-16 NOTE — Procedures (Signed)
Procedures  by Gareth Eagle at 09/16/18 1400                Author: Gareth Eagle  Service: --  Author Type: Medical Assistant       Filed: 09/16/18 1511  Encounter Date: 09/16/2018  Status: Signed          Editor: Gareth Eagle (Technician)                 [facility]   [facility]   [address]   [place], [place] [address]   p: [phone]  f: [phone]      Test Date:  09/16/2018             Patient:  Kimberly [name]  DOB:  April 07, 1924  Physician:  Dr.Aralu            Sex:  Female  Height:  5\' 3"   Ref Phys:  Dr.Aralu            ID#:  [id number]  Weight:  134 lbs.  Technician:  Mackey Birchwood        Patient Complaints:   b/l le      Medications:         Patient History / Exam:         NCV & EMG Findings:   Evaluation of the right tibial motor nerve showed reduced amplitude (0.8 mV).  The left Sup Fibular sensory and the right Sup Fibular sensory nerves showed no response (14 cm).  The left sural sensory and the right sural sensory nerves showed no response  (Calf).  All remaining nerves (as indicated in the following tables) were within normal limits.  Left vs. Right side comparison data for the tibial motor nerve indicates abnormal L-R amplitude difference (87.3 %).  All remaining left vs. right side differences  were within normal limits.        All examined muscles (as indicated in the following table) showed no evidence of electrical instability.        Impression:         Recommendations:         ___________________________   Dr.Aralu           Nerve Conduction Studies   Anti Sensory Summary Table                    Stim Site  NR  Peak (ms)  Norm Peak (ms)  P-T Amp (V)  Norm P-T Amp  Site1  Site2  Delta-P (ms)  Dist (cm)  Vel (m/s)  Norm Vel (m/s)       Left Sup Fibular Anti Sensory (Ant Lat Mall)  34.6C                  14 cm  NR    <4.4    >5.0  14 cm  Ant Lat Mall    14.0    >32       Right Sup Fibular Anti Sensory (Ant Lat Mall)  35.7C                   14 cm  NR    <4.4    >5.0  14 cm  Ant Lat Mall    14.0    >32       Left Sural Anti Sensory (Lat Mall)  33.9C  Calf  NR    <4.0    >5.0  Calf  Lat Mall    14.0    >35       Right Sural Anti Sensory (Lat Mall)  35.6C                  Calf  NR    <4.0    >5.0  Calf  Lat Mall    14.0    >35        Motor Summary Table                    Stim Site  NR  Onset (ms)  Norm Onset (ms)  O-P Amp (mV)  Norm O-P Amp  Site1  Site2  Delta-0 (ms)  Dist (cm)  Vel (m/s)  Norm Vel (m/s)       Left Fibular Motor (Ext Dig Brev)  35.1C                  Ankle      4.3  <6.1  3.0  >2.5  B Fib  Ankle  6.3  32.0  51  >38     B Fib      10.6    3.0    Poplt  B Fib  2.1  10.0  48  >40                  Poplt      12.7    2.4                     Right Fibular Motor (Ext Dig Brev)  35.8C                  Ankle      4.0  <6.1  3.3  >2.5  B Fib  Ankle  6.8  30.0  44  >38     B Fib      10.8    2.2    Poplt  B Fib  1.5  10.0  67  >40                  Poplt      12.3    1.9                     Left Tibial Motor (Abd Hall Brev)  35.4C                  Ankle      5.3  <6.1  6.3  >3.0  Knee  Ankle  7.0  38.0  54  >35                  Knee      12.3    5.8                     Right Tibial Motor (Abd Hall Brev)  35.9C                  Ankle      4.1  <6.1  0.8  >3.0  Knee  Ankle  6.8  39.0  57  >35                  Knee      10.9    1.1  EMG+                     Side  Muscle  Nerve  Root  Ins Act  Fibs  Psw  Amp  Dur  Poly  Recrt  [name]  Comment                   Right  Abd Poll Brev  Median  C8-T1  Nml  Nml  Nml  Nml  Nml  0  Nml  Nml       Right  VastusMed  Femoral  L2-4  Nml  Nml  Nml  Nml  Nml  0  Nml  Nml       Right  VastusLat  Femoral  L2-4  Nml  Nml  Nml  Nml  Nml  0  Nml  Nml       Right  QuadratusFem  QuadFemoris  L4-5, S1  Nml  Nml  Nml  Nml  Nml  0  Nml  Nml       Right  AntTibialis  Dp Br Fibular  L4-5  Nml  Nml  Nml  Nml  Nml  0  Nml  Nml                     Right  Fibularis Long  Sup Br  Fibular  L5-S1  Nml  Nml  Nml  Nml  Nml  0  Nml  Nml             Nerve Conduction Studies   Anti Sensory Left/Right Comparison                    Stim Site  L Lat (ms)  R Lat (ms)  L-R Lat (ms)  L Amp (V)  R Amp (V)  L-R Amp (%)  Site1  Site2  L Vel (m/s)  R Vel (m/s)  L-R Vel (m/s)       Sup Fibular Anti Sensory (Ant Lat Mall)  34.6C                  14 cm              14 cm  Ant Lat Mall             Sural Anti Sensory (Lat Mall)  33.9C                  Calf              Calf  Lat Mall              Motor Left/Right Comparison                    Stim Site  L Lat (ms)  R Lat (ms)  L-R Lat (ms)  L Amp (mV)  R Amp (mV)  L-R Amp (%)  Site1  Site2  L Vel (m/s)  R Vel (m/s)  L-R Vel (m/s)       Fibular Motor (Ext Dig Brev)  35.1C                  Ankle  4.3  4.0  0.3  3.0  3.3  9.1  B Fib  Ankle  51  44  7     B Fib  10.6  10.8  0.2  3.0  2.2  26.7  Poplt  B Fib  48  67  19                  Poplt  12.7  12.3  0.4  2.4  1.9  20.8                 Tibial Motor (Abd Hall Brev)  35.4C                  Ankle  5.3  4.1  1.2  6.3  0.8  87.3  Knee  Ankle  54  57  3                  Knee  12.3  10.9  1.4  5.8  1.1  81.0                       Waveforms:

## 2018-09-22 ENCOUNTER — Ambulatory Visit: Admit: 2018-09-22 | Discharge: 2018-09-22 | Payer: MEDICARE | Attending: Surgery | Primary: Internal Medicine

## 2018-09-22 ENCOUNTER — Ambulatory Visit: Attending: Surgery | Primary: Family Medicine

## 2018-09-22 DIAGNOSIS — C50412 Malignant neoplasm of upper-outer quadrant of left female breast: Secondary | ICD-10-CM

## 2018-09-22 NOTE — Progress Notes (Signed)
HISTORY OF PRESENT ILLNESS  Kimberly [name] is a 85 y.o. female who comes in for follow up by Darlys Gales, MD for  breast cancer  Breast Cancer   Associated symptoms include headaches. Pertinent negatives include no chest pain, no abdominal pain and no shortness of breath.   she went for screening mammogram and was noted to have a density in the UOQ of the left breast.  Subsequently she had an [place] confirming a 2.0 x 1.8 x 1.6 cm in the 0100 position 6 cm from nipple.   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  She had not felt a lump, skin changes, nipple changes or drainage, unexplained weight loss or bone pain.  She had menarche at 4, only pregnancy at 9, menopause at 70 and did not take HRT.  Her MGM, M, D, and sister all died from metastatic breast cancer.   She underwent a left lumpectomy for a T2NxMx IDC 03/10/2018.  She received adjuvant WBRT Briscoe Burns).    Past Medical History:   Diagnosis Date   ??? Adverse effect of anesthesia     slow to wake   ??? Anginal pain ([place])    ??? Arthritis    ??? Cancer ([place]) 01/2018    breast   ??? Chronic pain     neuropathy   ??? Constipation    ??? GERD (gastroesophageal reflux disease)    ??? Hearing loss    ??? High cholesterol    ??? Hypertension    ??? Incontinence    ??? Joint pain    ??? Memory disorder    ??? Muscle pain    ??? Osteoporosis    ??? Ringing in the ears    ??? Snoring    ??? Visual disturbance      Past Surgical History:   Procedure Laterality Date   ??? HX BREAST LUMPECTOMY Left 03/10/2018    LEFT BREAST LUMPECTOMY WITH ULTRASOUND GUIDANCE performed by Luellen Pucker, MD at [facility]   ??? HX HYSTERECTOMY     ??? HX ORTHOPAEDIC      epidural pain mtg - pt unsure   ??? HX OTHER SURGICAL Right 10/15/2016    ligament correction right foot   ??? HX OTHER SURGICAL  11/26/2016    Pituitary tumor removal     Family History   Problem Relation Age of Onset   ??? Breast Cancer Mother 37   ??? No Known Problems Father     ??? Breast Cancer Sister 77   ??? Breast Cancer Daughter 63   ??? Dementia Brother    ??? Dementia Sister      Social History     Tobacco Use   ??? Smoking status: Never Smoker   ??? Smokeless tobacco: Never Used   Substance Use Topics   ??? Alcohol use: No   ??? Drug use: No     Current Outpatient Medications   Medication Sig   ??? ezetimibe (ZETIA) 10 mg tablet TAKE 1 TABLET BY MOUTH DAILY   ??? cilostazoL (PLETAL) 100 mg tablet TAKE 1 TABLET BY MOUTH TWICE DAILY ON EMPTY STOMACH   ??? verapamil ER (CALAN-SR) 180 mg CR tablet TAKE 1 TABLET BY MOUTH EVERY NIGHT   ??? traMADol (ULTRAM) 50 mg tablet every eight (8) hours as needed. 1/2 tablet   ??? pregabalin (LYRICA) 100 mg capsule Take 1 Cap by mouth two (2) times a day. Max Daily Amount: 200 mg.   ???  omeprazole (PRILOSEC) 40 mg capsule TAKE 1 CAPSULE BY MOUTH DAILY   ??? oxybutynin (DITROPAN) 5 mg tablet TAKE 1 TABLET BY MOUTH TWICE DAILY   ??? alendronate (FOSAMAX) 70 mg tablet TAKE 1 TABLET BY MOUTH EVERY 7 DAYS   ??? acetaminophen (TYLENOL) 500 mg tablet Take 1,000 mg by mouth every six (6) hours as needed for Pain.   ??? omeprazole (PRILOSEC) 40 mg capsule TAKE 1 CAPSULE BY MOUTH DAILY   ??? estradiol (VAGIFEM) 10 mcg tab vaginal tablet INSERT 1 T VAGINALLY TWICE PER WEEK   ??? isosorbide dinitrate (ISORDIL) 10 mg tablet TAKE 1 TABLET BY MOUTH TWICE DAILY   ??? lisinopril (PRINIVIL) 10 mg tablet Take 1 Tab by mouth daily.   ??? polyethylene glycol (MIRALAX) 17 gram/dose powder TAKE 1 CAPFUL PO ONCE A DAY   ??? aspirin 81 mg chewable tablet Take 81 mg by mouth daily.     No current facility-administered medications for this visit.      Allergies   Allergen Reactions   ??? Pcn [Penicillins] Hives and Myalgia   ??? Sulfa (Sulfonamide Antibiotics) Unknown (comments)         Review of Systems   Constitutional: Positive for malaise/fatigue. Negative for chills, diaphoresis, fever and weight loss.   HENT: Positive for hearing loss. Negative for sore throat.    Eyes: Negative for blurred vision and discharge.         Vision loss   Respiratory: Negative for cough, shortness of breath and wheezing.    Cardiovascular: Positive for leg swelling. Negative for chest pain, palpitations, orthopnea and claudication.   Gastrointestinal: Positive for nausea. Negative for abdominal pain, constipation, diarrhea, heartburn, melena and vomiting.   Genitourinary: Positive for dysuria. Negative for flank pain, frequency and hematuria.   Musculoskeletal: Negative for back pain, joint pain, myalgias and neck pain.   Skin: Negative for rash.   Neurological: Positive for headaches. Negative for dizziness, speech change, focal weakness, seizures, loss of consciousness and weakness.   Endo/Heme/Allergies: Does not bruise/bleed easily.   Psychiatric/Behavioral: Negative for depression and memory loss.     There were no vitals taken for this visit.    Physical Exam  Exam conducted with a chaperone present.   Constitutional:       General: She is not in acute distress.     Appearance: She is well-developed. She is not diaphoretic.   HENT:      Head: Normocephalic and atraumatic.      Mouth/Throat:      Pharynx: No oropharyngeal exudate.   Eyes:      General: No scleral icterus.     Conjunctiva/sclera: Conjunctivae normal.      Pupils: Pupils are equal, round, and reactive to light.   Neck:      Musculoskeletal: Normal range of motion and neck supple.      Thyroid: No thyromegaly.      Vascular: No JVD.      Trachea: No tracheal deviation.   Cardiovascular:      Rate and Rhythm: Normal rate and regular rhythm.      Heart sounds: No murmur. No friction rub. No gallop.    Pulmonary:      Effort: Pulmonary effort is normal. No respiratory distress.      Breath sounds: Normal breath sounds. No wheezing or rales.   Chest:      Breasts: Breasts are symmetrical.         Right: Normal. No inverted nipple, mass, nipple discharge, skin  change or tenderness.         Left: Mass (induration at lumpectomy site) and skin change  (radiation changes) present. No inverted nipple, nipple discharge or tenderness.       Abdominal:      General: Bowel sounds are normal. There is no distension.      Palpations: Abdomen is soft. There is no mass.      Tenderness: There is no abdominal tenderness. There is no guarding or rebound.   Musculoskeletal: Normal range of motion.   Lymphadenopathy:      Cervical: No cervical adenopathy.      Upper Body:      Right upper body: No supraclavicular adenopathy.      Left upper body: No supraclavicular adenopathy.   Skin:     General: Skin is warm and dry.      Coloration: Skin is not pale.      Findings: No erythema or rash.   Neurological:      Mental Status: She is alert and oriented to person, place, and time.      Cranial Nerves: No cranial nerve deficit.   Psychiatric:         Behavior: Behavior normal.         Thought Content: Thought content normal.         Judgment: Judgment normal.           ASSESSMENT and PLAN  1.  Left breast cancer early stage, Clinically stage 2A (T2N0M0). Triple negative Dx 01/2018.   NED at 8 months  2.  Strong family hx breast cancer    3.  Allergy to PCN with hives and upset stomach.  Ok for Ancef  4.  Essential hypertension.  On rx    Bilateral 3D dx mammogram 01/2019    RTC 6 months      Luellen Pucker, MD FACS

## 2018-09-22 NOTE — Progress Notes (Signed)
Room: 1    Identified pt with two pt identifiers(name and DOB). Reviewed record in preparation for visit and have obtained necessary documentation. All patient medications has been reviewed.    Chief Complaint   Patient presents with   ??? Follow-up     Breast check of left breast    ??? Breast Cancer       Health Maintenance Due   Topic   ??? Shingrix Vaccine Age 85> (1 of 2)   ??? GLAUCOMA SCREENING Q2Y    ??? Medicare Yearly Exam        Vitals:    09/22/18 1156   BP: 142/66   Pulse: (!) 58   Resp: 16   Temp: 98.3 ??F (36.8 ??C)   TempSrc: Oral   SpO2: 94%   Weight: 61 kg (134 lb 8 oz)   Height: 5\' 1"  (1.549 m)   PainSc:   0 - No pain       1. Have you been to the ER, urgent care clinic since your last visit?  Hospitalized since your last visit?No    2. Have you seen or consulted any other health care providers outside of the [place] [facility] since your last visit?  Include any pap smears or colon screening. No    Patient is accompanied by self I have received verbal consent from [name] to discuss any/all medical information while they are present in the room.

## 2018-09-22 NOTE — Progress Notes (Signed)
 Room: 1    Identified pt with two pt identifiers(name and DOB). Reviewed record in preparation for visit and have obtained necessary documentation. All patient medications has been reviewed.    Chief Complaint   Patient presents with   . Follow-up     Breast check of left breast    . Breast Cancer       Health Maintenance Due   Topic   . Shingrix Vaccine Age 85> (1 of 2)   . GLAUCOMA SCREENING Q2Y    . Medicare Yearly Exam        Vitals:    09/22/18 1156   BP: 142/66   Pulse: (!) 58   Resp: 16   Temp: 98.3 F (36.8 C)   TempSrc: Oral   SpO2: 94%   Weight: 61 kg (134 lb 8 oz)   Height: 5' 1 (1.549 m)   PainSc:   0 - No pain       1. Have you been to the ER, urgent care clinic since your last visit?  Hospitalized since your last visit?No    2. Have you seen or consulted any other health care providers outside of the [facility] since your last visit?  Include any pap smears or colon screening. No    Patient is accompanied by self I have received verbal consent from Kimberly [name] to discuss any/all medical information while they are present in the room.

## 2018-09-22 NOTE — Progress Notes (Signed)
Progress Notes by Luellen Pucker, MD at 09/22/18 1120                Author: Luellen Pucker, MD  Service: --  Author Type: Physician       Filed: 09/22/18 1210  Encounter Date: 09/22/2018  Status: Signed          Editor: Luellen Pucker, MD (Physician)               HISTORY OF PRESENT ILLNESS   Kimberly [name] is a 85 y.o.  female who comes in for follow up by Darlys Gales, MD for  breast cancer   Breast Cancer    Associated symptoms include headaches. Pertinent negatives include no chest pain,  no abdominal pain and no shortness of breath.    she went for screening mammogram and was noted to have a density in the UOQ of the left breast.  Subsequently she had an [place] confirming a 2.0 x 1.8 x 1.6 cm in the 0100 position 6 cm from nipple.   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  She had not felt a lump, skin changes, nipple changes or drainage, unexplained weight loss or bone pain.  She had menarche at 50, only pregnancy at  69, menopause at 77 and did not take HRT.  Her MGM, M, D, and sister all died from metastatic breast cancer.   She underwent a left lumpectomy for a T2NxMx IDC 03/10/2018.  She received adjuvant WBRT Briscoe Burns).        Past Medical History:        Diagnosis  Date         ?  Adverse effect of anesthesia            slow to wake         ?  Anginal pain ([place])       ?  Arthritis       ?  Cancer ([place])  01/2018          breast         ?  Chronic pain            neuropathy         ?  Constipation       ?  GERD (gastroesophageal reflux disease)       ?  Hearing loss       ?  High cholesterol       ?  Hypertension       ?  Incontinence       ?  Joint pain       ?  Memory disorder       ?  Muscle pain       ?  Osteoporosis       ?  Ringing in the ears       ?  Snoring           ?  Visual disturbance            Past Surgical History:         Procedure  Laterality  Date          ?  HX BREAST LUMPECTOMY  Left   03/10/2018          LEFT BREAST LUMPECTOMY WITH ULTRASOUND GUIDANCE performed by Luellen Pucker, MD at [facility]          ?  HX HYSTERECTOMY         ?  HX ORTHOPAEDIC              epidural pain mtg - pt unsure          ?  HX OTHER SURGICAL  Right  10/15/2016          ligament correction right foot          ?  HX OTHER SURGICAL    11/26/2016          Pituitary tumor removal          Family History         Problem  Relation  Age of Onset          ?  Breast Cancer  Mother  31     ?  No Known Problems  Father       ?  Breast Cancer  Sister  77     ?  Breast Cancer  Daughter  71     ?  Dementia  Brother            ?  Dementia  Sister            Social History          Tobacco Use         ?  Smoking status:  Never Smoker     ?  Smokeless tobacco:  Never Used       Substance Use Topics         ?  Alcohol use:  No         ?  Drug use:  No          Current Outpatient Medications        Medication  Sig         ?  ezetimibe (ZETIA) 10 mg tablet  TAKE 1 TABLET BY MOUTH DAILY     ?  cilostazoL (PLETAL) 100 mg tablet  TAKE 1 TABLET BY MOUTH TWICE DAILY ON EMPTY STOMACH     ?  verapamil ER (CALAN-SR) 180 mg CR tablet  TAKE 1 TABLET BY MOUTH EVERY NIGHT     ?  traMADol (ULTRAM) 50 mg tablet  every eight (8) hours as needed. 1/2 tablet     ?  pregabalin (LYRICA) 100 mg capsule  Take 1 Cap by mouth two (2) times a day. Max Daily Amount: 200 mg.     ?  omeprazole (PRILOSEC) 40 mg capsule  TAKE 1 CAPSULE BY MOUTH DAILY     ?  oxybutynin (DITROPAN) 5 mg tablet  TAKE 1 TABLET BY MOUTH TWICE DAILY     ?  alendronate (FOSAMAX) 70 mg tablet  TAKE 1 TABLET BY MOUTH EVERY 7 DAYS     ?  acetaminophen (TYLENOL) 500 mg tablet  Take 1,000 mg by mouth every six (6) hours as needed for Pain.     ?  omeprazole (PRILOSEC) 40 mg capsule  TAKE 1 CAPSULE BY MOUTH DAILY     ?  estradiol (VAGIFEM) 10 mcg tab vaginal tablet  INSERT 1 T VAGINALLY TWICE PER WEEK     ?  isosorbide dinitrate (ISORDIL) 10 mg tablet  TAKE 1 TABLET BY MOUTH TWICE  DAILY     ?  lisinopril (PRINIVIL) 10 mg tablet  Take 1 Tab by mouth daily.     ?  polyethylene glycol (MIRALAX) 17 gram/dose powder  TAKE 1 CAPFUL PO ONCE A DAY         ?  aspirin 81 mg chewable tablet  Take 81 mg by mouth daily.          No current facility-administered medications for this visit.           Allergies        Allergen  Reactions         ?  Pcn [Penicillins]  Hives and Myalgia         ?  Sulfa (Sulfonamide Antibiotics)  Unknown (comments)              Review of Systems    Constitutional: Positive for malaise/fatigue. Negative for chills, diaphoresis, fever and weight loss.    HENT: Positive for hearing loss. Negative for sore throat.     Eyes: Negative for blurred vision and discharge.         Vision loss    Respiratory: Negative for cough, shortness of breath and wheezing.     Cardiovascular: Positive for leg swelling. Negative for chest pain, palpitations, orthopnea and claudication.    Gastrointestinal: Positive for nausea. Negative for abdominal pain, constipation, diarrhea, heartburn, melena and vomiting.    Genitourinary: Positive for dysuria. Negative for flank pain, frequency and hematuria.    Musculoskeletal: Negative for back pain, joint pain, myalgias and neck pain.    Skin: Negative for rash.    Neurological: Positive for headaches. Negative for dizziness, speech change, focal weakness, seizures, loss of consciousness and weakness.     Endo/Heme/Allergies: Does not bruise/bleed easily.    Psychiatric/Behavioral: Negative for depression and memory loss.       There were no vitals taken for this visit.      Physical Exam   Exam conducted with a chaperone present.   Constitutional:        General: She is not in acute distress.     Appearance: She is well-developed. She is not diaphoretic.    HENT:       Head: Normocephalic and atraumatic.      Mouth/Throat:      Pharynx: No oropharyngeal exudate.    Eyes:       General: No scleral icterus.     Conjunctiva/sclera: Conjunctivae normal.       Pupils: Pupils are equal, round, and reactive to light.   Neck:       Musculoskeletal: Normal range of motion and neck supple.      Thyroid: No thyromegaly.      Vascular: No JVD.      Trachea: No tracheal deviation.   Cardiovascular:       Rate and Rhythm: Normal rate and regular rhythm.      Heart sounds: No murmur. No friction rub. No gallop.     Pulmonary:       Effort: Pulmonary effort is normal. No respiratory distress.      Breath sounds: Normal breath sounds. No wheezing or rales.   Chest:       Breasts: Breasts are symmetrical.          Right: Normal. No inverted nipple, mass, nipple discharge, skin change or tenderness.         Left: Mass  (induration at lumpectomy site) and skin change  (radiation changes) present. No inverted nipple, nipple discharge or tenderness.         Abdominal :      General: Bowel sounds are normal. There is no distension.      Palpations: Abdomen is soft. There is no mass.      Tenderness:  There is no abdominal tenderness. There is no guarding or rebound.     Musculoskeletal: Normal range of motion.     Lymphadenopathy:       Cervical: No cervical adenopathy.      Upper Body:      Right upper body: No supraclavicular adenopathy.      Left upper body: No supraclavicular adenopathy.   Skin:      General: Skin is warm and dry.      Coloration: Skin is not pale.      Findings: No erythema or rash.    Neurological:       Mental Status: She is alert and oriented to person, place, and time.      Cranial Nerves: No cranial nerve deficit.    Psychiatric:         Behavior: Behavior normal.         Thought Content: Thought content normal.         Judgment: Judgment normal.                ASSESSMENT and PLAN   1.  Left breast cancer early stage, Clinically stage 2A (T2N0M0). Triple negative Dx 01/2018.   NED at 8 months   2.  Strong family hx breast cancer      3.  Allergy to PCN with hives and upset stomach.  Ok for Ancef   4.  Essential hypertension.  On rx      Bilateral 3D dx mammogram  01/2019      RTC 6 months         Luellen Pucker, MD FACS

## 2018-09-30 NOTE — Telephone Encounter (Signed)
Patient having a lot of pain and burning in legs.  Her appt is too far to wait.  She wants to know what do they do.  They are interested in EMG results.

## 2018-10-01 NOTE — Telephone Encounter (Signed)
Called. Caregiver will take Fri at 1pm

## 2018-10-01 NOTE — Telephone Encounter (Signed)
PSR will schedule.

## 2018-10-03 ENCOUNTER — Ambulatory Visit: Admit: 2018-10-03 | Discharge: 2018-10-03 | Payer: MEDICARE | Attending: Neurology | Primary: Internal Medicine

## 2018-10-03 ENCOUNTER — Ambulatory Visit: Attending: Neurology | Primary: Family Medicine

## 2018-10-03 DIAGNOSIS — G629 Polyneuropathy, unspecified: Secondary | ICD-10-CM

## 2018-10-03 MED ORDER — FOLIC ACID 1 MG TAB
1 mg | ORAL_TABLET | Freq: Every day | ORAL | 3 refills | Status: DC
Start: 2018-10-03 — End: 2019-02-05

## 2018-10-03 MED ORDER — PREGABALIN 100 MG CAP
100 mg | ORAL_CAPSULE | Freq: Two times a day (BID) | ORAL | 2 refills | Status: DC
Start: 2018-10-03 — End: 2019-01-05

## 2018-10-03 NOTE — Progress Notes (Signed)
Neurology Progress Note    NAME:  MARGARETH [name]   DOB:   11/01/1923   MRN:   [id number]     Date/Time:  10/03/2018  Subjective:   Kimberly [name] is a 85 y.o. female here today for follow up for meningioma, status post resection, headache, leg pain, numbness and tingling sensation, test results.  Patient was accompanied by her niece to the visit.  She says she still having a lot of pain, mostly in the legs, pain is described as sharp in nature and tend to wake patient up at night.  .  She still having difficulty ambulating with a right foot drop.  Patient says she is not taking Lyrica, apparently patient did not understand what was explained to her previous visit that he should continue taking the Lyrica.  EMG/nerve conduction study of the lower extremity discussed with patient and niece was significant for severe sensorimotor polyneuropathy bilateral lower extremity consistent with foot drop.  Condition was explained to patient and niece they appeared to understand  Patient was given Lyrica 100 mg p.o. twice daily  Folic acid 1 mg p.o. daily  Tramadol 50 mg p.o. PRN for severe pain.  Headache has improved some, according to patient, headache has not been as frequent as it used to be.  Medication has been helpful.  Headache is throbbing in nature mostly on one side that is the left side, associated with blurry vision.  She still experiences blurry vision.   She however, denies loss of consciousness dysphagia or odynophagia.  Denies any fall, incontinence or dysuria.  Review of Systems - General ROS: positive for  - fatigue and sleep disturbance  Psychological ROS: positive for - anxiety and sleep disturbances  Ophthalmic ROS: positive for - blurry vision, decreased vision and photophobia  ENT ROS: positive for - headaches, tinnitus and visual changes  Allergy and Immunology ROS: negative  Hematological and Lymphatic ROS: negative  Endocrine ROS: negative  Respiratory ROS: no cough, shortness of breath, or wheezing   Cardiovascular ROS: no chest pain or dyspnea on exertion  Gastrointestinal ROS: no abdominal pain, change in bowel habits, or black or bloody stools  Genito-Urinary ROS: no dysuria, trouble voiding, or hematuria  Musculoskeletal ROS: Muscle weakness, joint pains  Neurological ROS: Headaches, numbness and tingling sensation,dizziness.  Dermatological ROS: negative      Medications reviewed:  Current Outpatient Medications   Medication Sig Dispense Refill   ??? ezetimibe (ZETIA) 10 mg tablet TAKE 1 TABLET BY MOUTH DAILY 90 Tab 1   ??? cilostazoL (PLETAL) 100 mg tablet TAKE 1 TABLET BY MOUTH TWICE DAILY ON EMPTY STOMACH 180 Tab 0   ??? verapamil ER (CALAN-SR) 180 mg CR tablet TAKE 1 TABLET BY MOUTH EVERY NIGHT 90 Tab 0   ??? traMADol (ULTRAM) 50 mg tablet every eight (8) hours as needed. 1/2 tablet     ??? pregabalin (LYRICA) 100 mg capsule Take 1 Cap by mouth two (2) times a day. Max Daily Amount: 200 mg. 60 Cap 2   ??? oxybutynin (DITROPAN) 5 mg tablet TAKE 1 TABLET BY MOUTH TWICE DAILY 180 Tab 1   ??? alendronate (FOSAMAX) 70 mg tablet TAKE 1 TABLET BY MOUTH EVERY 7 DAYS 4 Tab 5   ??? omeprazole (PRILOSEC) 40 mg capsule TAKE 1 CAPSULE BY MOUTH DAILY 90 Cap 3   ??? estradiol (VAGIFEM) 10 mcg tab vaginal tablet INSERT 1 T VAGINALLY TWICE PER WEEK  3   ??? isosorbide dinitrate (ISORDIL) 10 mg tablet  TAKE 1 TABLET BY MOUTH TWICE DAILY 180 Tab 0   ??? lisinopril (PRINIVIL) 10 mg tablet Take 1 Tab by mouth daily. 30 Tab 0   ??? aspirin 81 mg chewable tablet Take 81 mg by mouth daily.     ??? acetaminophen (TYLENOL) 500 mg tablet Take 1,000 mg by mouth every six (6) hours as needed for Pain.     ??? polyethylene glycol (MIRALAX) 17 gram/dose powder TAKE 1 CAPFUL PO ONCE A DAY  11        Objective:   Vitals:  Vitals:    10/03/18 1312   BP: 158/77   Pulse: 68   Resp: 16   Temp: 97.9 ??F (36.6 ??C)   TempSrc: Temporal   SpO2: 99%   Weight: 131 lb 9.6 oz (59.7 kg)   Height: 5\' 1"  (1.549 m)   PainSc:   5   PainLoc: Leg               Lab Data Reviewed:   Lab Results   Component Value Date/Time    WBC 3.7 06/16/2018 04:49 PM    HCT 35.5 06/16/2018 04:49 PM    HGB 11.4 06/16/2018 04:49 PM    PLATELET 268 06/16/2018 04:49 PM       Lab Results   Component Value Date/Time    Sodium 143 06/16/2018 04:49 PM    Potassium 4.6 06/16/2018 04:49 PM    Chloride 106 06/16/2018 04:49 PM    CO2 21 06/16/2018 04:49 PM    Glucose 87 06/16/2018 04:49 PM    BUN 13 06/16/2018 04:49 PM    Creatinine 0.80 06/16/2018 04:49 PM    Calcium 9.3 06/16/2018 04:49 PM       No components found for: TROPQUANT    No results found for: ANA      No results found for: HBA1C, HGBE8, HBA1CPOC, HBA1CEXT     Lab Results   Component Value Date/Time    Vitamin B12 668 09/04/2018 01:07 PM       No results found for: ANA, Phillip Heal, XBANA    Lab Results   Component Value Date/Time    Cholesterol, total 158 06/16/2018 04:49 PM    HDL Cholesterol 57 06/16/2018 04:49 PM    LDL, calculated 87 06/16/2018 04:49 PM    VLDL, calculated 14 06/16/2018 04:49 PM    Triglyceride 72 06/16/2018 04:49 PM         CT Results (recent):  Results from [place] encounter on 05/15/17   CT HEAD WO CONT    Narrative EXAM:  CT HEAD WO CONT  Clinical history: Intractable headache  INDICATION:   intractable headache    COMPARISON: 11/30/2016.    CONTRAST:  None.    TECHNIQUE: Unenhanced CT of the head was performed using 5 mm images. Brain and  bone windows were generated.  CT dose reduction was achieved through use of a  standardized protocol tailored for this examination and automatic exposure  control for dose modulation.      FINDINGS:  The ventricles and sulci are normal in size, shape and configuration and  midline. Scattered hypodensities in the cerebral white matter.. There is no  intracranial hemorrhage, extra-axial collection,. Pituitary mass lesion likely  microadenoma.  Sulcal and ventricular prominence.. No acute infarct is  identified. The bone windows demonstrate no abnormalities. The visualized   portions of the paranasal sinuses and mastoid air cells are clear.      Impression IMPRESSION:     No  acute intracranial process.    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           MRI Results (recent):  Results from [place] encounter on 06/05/16   MRI BRAIN W WO CONT    Narrative INDICATION:  ADENOMA, HEADACCE     EXAMINATION:  MRI BRAIN, PITUITARY, W/WO CONTRAST    COMPARISON:  CT May 23, 2016    TECHNIQUE:  MR imaging of the brain was performed with particular attention to  the pituitary gland including sagittal T1, axial T1, T2, FLAIR, DWI/ADC; Pre and  post contrast dynamic imaging was performed using 7 mL of gadolinium with  particular attention to the sella turcica.    FINDINGS:      Ventricles:  Prominence of the ventricles, sulci and cisterns related to  underlying atrophy.    Intracranial Hemorrhage:  None.    Brain Parenchyma/Brainstem:  Mild chronic white matter disease in the  supratentorial brain. No acute infarction.  Basal Cisterns:  Normal.   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 This displaces normal pituitary tissue and infundibulum toward the  left. This abuts and slightly deforms the optic chiasm again just right of  midline. Mass extends toward the right cavernous sinus, though without definite  invasion  Flow Voids:  Normal.  Post Contrast:  No abnormal parenchymal or meningeal enhancement.  Additional Comments:  N/A.      Impression IMPRESSION:    1. 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  2. Mild chronic white matter disease with no acute abnormality         IR Results (recent):  No results found for this or any previous visit.    VAS/US Results (recent):  Results from Hospital Encounter encounter on 04/24/16    DUPLEX LOWER EXT VENOUS RIGHT    Narrative HISTORY: Right leg edema and pain.     PROCEDURE: Multiple real time duplex and color Doppler interrogation of the  right lower extremity deep venous system were performed.    FINDINGS: The veins are compressible and demonstrate normal phasicity and  augmentation. No filling defects are identified to suggest deep venous  thrombosis. The veins of the groin and thigh and knee and calf were evaluated.    However, there is noted to be a right suprapatellar joint effusion with debris  measuring 6.1 x 1.6 cm.      Impression IMPRESSION:    No evidence of deep venous thrombosis.    Left suprapatellar joint effusion with debris.         PHYSICAL EXAM:  General:    Alert, cooperative, no distress, appears stated age.     Head:   Normocephalic, without obvious abnormality, atraumatic.  Eyes:   Conjunctivae/corneas clear.  PERRLA  Nose:  Nares normal. No drainage or sinus tenderness.  Throat:    Lips, mucosa, and tongue normal.  No Thrush  Neck:  Supple, symmetrical,  no adenopathy, thyroid: non tender    no carotid bruit and no JVD.  Back:    Symmetric,  No CVA tenderness.  Lungs:   Clear to auscultation bilaterally.  No Wheezing or Rhonchi. No rales.  Chest wall:  No tenderness or deformity. No Accessory muscle use.  Heart:   Regular rate and rhythm,  no murmur, rub or gallop.  Abdomen:   Soft, non-tender. Not distended.  Bowel sounds normal. No masses  Extremities: Extremities normal, atraumatic, No cyanosis.  No edema. No clubbing  Skin:     Texture, turgor normal. No rashes or lesions.  Not Jaundiced  Lymph nodes: Cervical, supraclavicular normal.  Psych:  Good insight.  Not depressed.  Anxious.      NEUROLOGICAL EXAM:  Appearance:  The patient is well developed, well nourished, provides a coherent history and is in no acute distress.   Mental Status: Oriented to time, place and person. Mood and affect appropriate.    Cranial Nerves:   Intact visual fields. Fundi are benign. PERLA, EOM's full, no nystagmus, no ptosis. Facial sensation is normal. Corneal reflexes are intact. Facial movement is symmetric. Hearing is normal bilaterally. Palate is midline with normal sternocleidomastoid and trapezius muscles are normal. Tongue is midline.   Motor:  5/5 strength in upper extremity and 4/5 lower extremity proximal and distal muscles. Normal bulk and tone. No fasciculations.   Reflexes:   Deep tendon reflexes 2+/4 and symmetrical.   Sensory:    Dysesthesia to touch, pinprick and vibration.   Gait:   Unsteady gait.   Tremor:   No tremor noted.   Cerebellar:  No cerebellar signs present.   Neurovascular:  Normal heart sounds and regular rhythm, peripheral pulses intact, and no carotid bruits.       Assesment  1. Polyneuropathy  Continue management    2. Sensory neuropathy  Continue management    3. Gait disorder  Physical therapy    4. Bilateral foot-drop  Physical therapy    5. Chronic pain syndrome  Continue management    ___________________________________________________  PLAN: Medication and plan discussed with patient and niece      ICD-10-CM ICD-9-CM    1. Polyneuropathy G62.9 356.9    2. Sensory neuropathy G62.9 356.9    3. Gait disorder R26.9 781.2    4. Bilateral foot-drop M21.371 736.79     M21.372     5. Chronic pain syndrome G89.4 338.4      Follow-up and Dispositions    ?? Return in about 3 months (around 01/01/2019).           __________________________________________________    Attending Physician: Nelwyn Salisbury, MD

## 2018-10-03 NOTE — Progress Notes (Signed)
Neurology Progress Note    NAME:  Kimberly [name]   DOB:   March 11, 1924   MRN:   [id number]     Date/Time:  10/03/2018  Subjective:   Kimberly [name] is a 85 y.o. female here today for follow up for meningioma, status post resection, headache, leg pain, numbness and tingling sensation, test results.  Patient was accompanied by her niece to the visit.  She says she still having a lot of pain, mostly in the legs, pain is described as sharp in nature and tend to wake patient up at night.  .  She still having difficulty ambulating with a right foot drop.  Patient says she is not taking Lyrica, apparently patient did not understand what was explained to her previous visit that he should continue taking the Lyrica.  EMG/nerve conduction study of the lower extremity discussed with patient and niece was significant for severe sensorimotor polyneuropathy bilateral lower extremity consistent with foot drop.  Condition was explained to patient and niece they appeared to understand  Patient was given Lyrica 100 mg p.o. twice daily  Folic acid 1 mg p.o. daily  Tramadol 50 mg p.o. PRN for severe pain.  Headache has improved some, according to patient, headache has not been as frequent as it used to be.  Medication has been helpful.  Headache is throbbing in nature mostly on one side that is the left side, associated with blurry vision.  She still experiences blurry vision.   She however, denies loss of consciousness dysphagia or odynophagia.  Denies any fall, incontinence or dysuria.  Review of Systems - General ROS: positive for  - fatigue and sleep disturbance  Psychological ROS: positive for - anxiety and sleep disturbances  Ophthalmic ROS: positive for - blurry vision, decreased vision and photophobia  ENT ROS: positive for - headaches, tinnitus and visual changes  Allergy and Immunology ROS: negative  Hematological and Lymphatic ROS: negative  Endocrine ROS: negative  Respiratory ROS: no cough, shortness of breath, or  wheezing  Cardiovascular ROS: no chest pain or dyspnea on exertion  Gastrointestinal ROS: no abdominal pain, change in bowel habits, or black or bloody stools  Genito-Urinary ROS: no dysuria, trouble voiding, or hematuria  Musculoskeletal ROS: Muscle weakness, joint pains  Neurological ROS: Headaches, numbness and tingling sensation,dizziness.  Dermatological ROS: negative      Medications reviewed:  Current Outpatient Medications   Medication Sig Dispense Refill   ??? ezetimibe (ZETIA) 10 mg tablet TAKE 1 TABLET BY MOUTH DAILY 90 Tab 1   ??? cilostazoL (PLETAL) 100 mg tablet TAKE 1 TABLET BY MOUTH TWICE DAILY ON EMPTY STOMACH 180 Tab 0   ??? verapamil ER (CALAN-SR) 180 mg CR tablet TAKE 1 TABLET BY MOUTH EVERY NIGHT 90 Tab 0   ??? traMADol (ULTRAM) 50 mg tablet every eight (8) hours as needed. 1/2 tablet     ??? pregabalin (LYRICA) 100 mg capsule Take 1 Cap by mouth two (2) times a day. Max Daily Amount: 200 mg. 60 Cap 2   ??? oxybutynin (DITROPAN) 5 mg tablet TAKE 1 TABLET BY MOUTH TWICE DAILY 180 Tab 1   ??? alendronate (FOSAMAX) 70 mg tablet TAKE 1 TABLET BY MOUTH EVERY 7 DAYS 4 Tab 5   ??? omeprazole (PRILOSEC) 40 mg capsule TAKE 1 CAPSULE BY MOUTH DAILY 90 Cap 3   ??? estradiol (VAGIFEM) 10 mcg tab vaginal tablet INSERT 1 T VAGINALLY TWICE PER WEEK  3   ??? isosorbide dinitrate (ISORDIL) 10 mg tablet  TAKE 1 TABLET BY MOUTH TWICE DAILY 180 Tab 0   ??? lisinopril (PRINIVIL) 10 mg tablet Take 1 Tab by mouth daily. 30 Tab 0   ??? aspirin 81 mg chewable tablet Take 81 mg by mouth daily.     ??? acetaminophen (TYLENOL) 500 mg tablet Take 1,000 mg by mouth every six (6) hours as needed for Pain.     ??? polyethylene glycol (MIRALAX) 17 gram/dose powder TAKE 1 CAPFUL PO ONCE A DAY  11        Objective:   Vitals:  Vitals:    10/03/18 1312   BP: 158/77   Pulse: 68   Resp: 16   Temp: 97.9 ??F (36.6 ??C)   TempSrc: Temporal   SpO2: 99%   Weight: 131 lb 9.6 oz (59.7 kg)   Height: 5\' 1"  (1.549 m)   PainSc:   5   PainLoc: Leg               Lab Data  Reviewed:  Lab Results   Component Value Date/Time    WBC 3.7 06/16/2018 04:49 PM    HCT 35.5 06/16/2018 04:49 PM    HGB 11.4 06/16/2018 04:49 PM    PLATELET 268 06/16/2018 04:49 PM       Lab Results   Component Value Date/Time    Sodium 143 06/16/2018 04:49 PM    Potassium 4.6 06/16/2018 04:49 PM    Chloride 106 06/16/2018 04:49 PM    CO2 21 06/16/2018 04:49 PM    Glucose 87 06/16/2018 04:49 PM    BUN 13 06/16/2018 04:49 PM    Creatinine 0.80 06/16/2018 04:49 PM    Calcium 9.3 06/16/2018 04:49 PM       No components found for: TROPQUANT    No results found for: ANA      No results found for: HBA1C, HGBE8, HBA1CPOC, HBA1CEXT     Lab Results   Component Value Date/Time    Vitamin B12 668 09/04/2018 01:07 PM       No results found for: ANA, Phillip Heal, XBANA    Lab Results   Component Value Date/Time    Cholesterol, total 158 06/16/2018 04:49 PM    HDL Cholesterol 57 06/16/2018 04:49 PM    LDL, calculated 87 06/16/2018 04:49 PM    VLDL, calculated 14 06/16/2018 04:49 PM    Triglyceride 72 06/16/2018 04:49 PM         CT Results (recent):  Results from [place] encounter on 05/15/17   CT HEAD WO CONT    Narrative EXAM:  CT HEAD WO CONT  Clinical history: Intractable headache  INDICATION:   intractable headache    COMPARISON: 11/30/2016.    CONTRAST:  None.    TECHNIQUE: Unenhanced CT of the head was performed using 5 mm images. Brain and  bone windows were generated.  CT dose reduction was achieved through use of a  standardized protocol tailored for this examination and automatic exposure  control for dose modulation.      FINDINGS:  The ventricles and sulci are normal in size, shape and configuration and  midline. Scattered hypodensities in the cerebral white matter.. There is no  intracranial hemorrhage, extra-axial collection,. Pituitary mass lesion likely  microadenoma.  Sulcal and ventricular prominence.. No acute infarct is  identified. The bone windows demonstrate no abnormalities. The  visualized  portions of the paranasal sinuses and mastoid air cells are clear.      Impression IMPRESSION:     No  acute intracranial process.    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           MRI Results (recent):  Results from [facility] encounter on 06/05/16   MRI BRAIN W WO CONT    Narrative INDICATION:  ADENOMA, HEADACCE     EXAMINATION:  MRI BRAIN, PITUITARY, W/WO CONTRAST    COMPARISON:  CT May 23, 2016    TECHNIQUE:  MR imaging of the brain was performed with particular attention to  the pituitary gland including sagittal T1, axial T1, T2, FLAIR, DWI/ADC; Pre and  post contrast dynamic imaging was performed using 7 mL of gadolinium with  particular attention to the sella turcica.    FINDINGS:      Ventricles:  Prominence of the ventricles, sulci and cisterns related to  underlying atrophy.    Intracranial Hemorrhage:  None.    Brain Parenchyma/Brainstem:  Mild chronic white matter disease in the  supratentorial brain. No acute infarction.  Basal Cisterns:  Normal.   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 This displaces normal pituitary tissue and infundibulum toward the  left. This abuts and slightly deforms the optic chiasm again just right of  midline. Mass extends toward the right cavernous sinus, though without definite  invasion  Flow Voids:  Normal.  Post Contrast:  No abnormal parenchymal or meningeal enhancement.  Additional Comments:  N/A.      Impression IMPRESSION:    1. 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  2. Mild chronic white matter disease with no acute abnormality         IR Results (recent):  No results found for this or any previous visit.    VAS/US Results (recent):  Results from Hospital Encounter encounter on 04/24/16   DUPLEX  LOWER EXT VENOUS RIGHT    Narrative HISTORY: Right leg edema and pain.     PROCEDURE: Multiple real time duplex and color Doppler interrogation of the  right lower extremity deep venous system were performed.    FINDINGS: The veins are compressible and demonstrate normal phasicity and  augmentation. No filling defects are identified to suggest deep venous  thrombosis. The veins of the groin and thigh and knee and calf were evaluated.    However, there is noted to be a right suprapatellar joint effusion with debris  measuring 6.1 x 1.6 cm.      Impression IMPRESSION:    No evidence of deep venous thrombosis.    Left suprapatellar joint effusion with debris.         PHYSICAL EXAM:  General:    Alert, cooperative, no distress, appears stated age.     Head:   Normocephalic, without obvious abnormality, atraumatic.  Eyes:   Conjunctivae/corneas clear.  PERRLA  Nose:  Nares normal. No drainage or sinus tenderness.  Throat:    Lips, mucosa, and tongue normal.  No Thrush  Neck:  Supple, symmetrical,  no adenopathy, thyroid: non tender    no carotid bruit and no JVD.  Back:    Symmetric,  No CVA tenderness.  Lungs:   Clear to auscultation bilaterally.  No Wheezing or Rhonchi. No rales.  Chest wall:  No tenderness or deformity. No Accessory muscle use.  Heart:   Regular rate and rhythm,  no murmur, rub or gallop.  Abdomen:   Soft, non-tender. Not distended.  Bowel sounds normal. No masses  Extremities: Extremities normal, atraumatic, No cyanosis.  No edema. No clubbing  Skin:     Texture, turgor normal. No rashes or lesions.  Not Jaundiced  Lymph nodes: Cervical, supraclavicular normal.  Psych:  Good insight.  Not depressed.  Anxious.      NEUROLOGICAL EXAM:  Appearance:  The patient is well developed, well nourished, provides a coherent history and is in no acute distress.   Mental Status: Oriented to time, place and person. Mood and affect appropriate.   Cranial Nerves:   Intact visual fields. Fundi are benign. PERLA, EOM's  full, no nystagmus, no ptosis. Facial sensation is normal. Corneal reflexes are intact. Facial movement is symmetric. Hearing is normal bilaterally. Palate is midline with normal sternocleidomastoid and trapezius muscles are normal. Tongue is midline.   Motor:  5/5 strength in upper extremity and 4/5 lower extremity proximal and distal muscles. Normal bulk and tone. No fasciculations.   Reflexes:   Deep tendon reflexes 2+/4 and symmetrical.   Sensory:    Dysesthesia to touch, pinprick and vibration.   Gait:   Unsteady gait.   Tremor:   No tremor noted.   Cerebellar:  No cerebellar signs present.   Neurovascular:  Normal heart sounds and regular rhythm, peripheral pulses intact, and no carotid bruits.       Assesment  1. Polyneuropathy  Continue management    2. Sensory neuropathy  Continue management    3. Gait disorder  Physical therapy    4. Bilateral foot-drop  Physical therapy    5. Chronic pain syndrome  Continue management    ___________________________________________________  PLAN: Medication and plan discussed with patient and niece      ICD-10-CM ICD-9-CM    1. Polyneuropathy G62.9 356.9    2. Sensory neuropathy G62.9 356.9    3. Gait disorder R26.9 781.2    4. Bilateral foot-drop M21.371 736.79     M21.372     5. Chronic pain syndrome G89.4 338.4      Follow-up and Dispositions    ?? Return in about 3 months (around 01/01/2019).           __________________________________________________    Attending Physician: Nelwyn Salisbury, MD

## 2018-10-24 ENCOUNTER — Encounter: Attending: Internal Medicine | Primary: Internal Medicine

## 2018-10-27 ENCOUNTER — Encounter: Attending: Internal Medicine | Primary: Internal Medicine

## 2018-10-27 NOTE — Telephone Encounter (Signed)
Spoke with patient  Verified patient with 2 patient identifiers    Patient question when is next follow up appointment ?  Informed per Dr Sampson Goon LOV note dated 02/07/2018 patient to follow up in 1 year.  Patient verbalized understanding.  Patient states was having some stomach discomfort this AM and is currently awaiting response from PCP.

## 2018-10-27 NOTE — Telephone Encounter (Signed)
Kimberly [name] reported the patient is experiencing stomach aches and nausea. She also is belching a lot. Patient also have concerns about balance. She seeks recommendations on what to do? Best contact is [phone]. Please advise!

## 2018-10-27 NOTE — Telephone Encounter (Signed)
Feels like she has to throw up , just not feeling well, she has spoke to her PCP 'S office but she is wondering if it is cardiac related, last seen 02-07-2018 with Dr. Sampson Goon.        Please advise, thanks

## 2018-10-28 MED ORDER — PROMETHAZINE 12.5 MG TAB
12.5 mg | ORAL_TABLET | Freq: Four times a day (QID) | ORAL | 0 refills | Status: DC | PRN
Start: 2018-10-28 — End: 2020-07-12

## 2018-10-28 MED ORDER — FAMOTIDINE 20 MG TAB
20 mg | ORAL_TABLET | Freq: Two times a day (BID) | ORAL | 3 refills | Status: DC
Start: 2018-10-28 — End: 2020-07-12

## 2018-11-14 MED ORDER — OMEPRAZOLE 40 MG CAP, DELAYED RELEASE
40 mg | ORAL_CAPSULE | ORAL | 3 refills | Status: DC
Start: 2018-11-14 — End: 2020-07-12

## 2018-12-03 ENCOUNTER — Encounter: Attending: Neurology | Primary: Internal Medicine

## 2018-12-10 ENCOUNTER — Inpatient Hospital Stay: Payer: MEDICARE | Attending: Radiation Oncology | Primary: Internal Medicine

## 2018-12-15 MED ORDER — VERAPAMIL ER 180 MG TAB
180 mg | ORAL_TABLET | ORAL | 0 refills | Status: DC
Start: 2018-12-15 — End: 2019-03-23

## 2018-12-19 MED ORDER — CILOSTAZOL 100 MG TAB
100 mg | ORAL_TABLET | ORAL | 0 refills | Status: DC
Start: 2018-12-19 — End: 2019-03-22

## 2018-12-19 NOTE — Telephone Encounter (Signed)
She is due for an ov- please ask to schedule virtual visit    Med has been refilled

## 2019-01-05 ENCOUNTER — Telehealth: Admit: 2019-01-05 | Payer: MEDICARE | Attending: Neurology | Primary: Internal Medicine

## 2019-01-05 ENCOUNTER — Encounter: Attending: Neurology | Primary: Internal Medicine

## 2019-01-05 ENCOUNTER — Telehealth: Attending: Neurology | Primary: Family Medicine

## 2019-01-05 DIAGNOSIS — G629 Polyneuropathy, unspecified: Secondary | ICD-10-CM

## 2019-01-05 MED ORDER — PREGABALIN 100 MG CAP
100 mg | ORAL_CAPSULE | Freq: Two times a day (BID) | ORAL | 3 refills | Status: DC
Start: 2019-01-05 — End: 2019-04-07

## 2019-01-05 NOTE — Progress Notes (Signed)
Neurology Progress Note  Kimberly [name] was seen by synchronous (real-time) audio-video technology on 01/05/19.     Consent:  She and/or her healthcare decision maker is aware that this patient-initiated Telehealth encounter is a billable service, with coverage as determined by her insurance carrier. She is aware that she may receive a bill and has provided verbal consent to proceed: Yes    I was in the office while conducting this encounter.      NAME:  AYLYN WENZLER   DOB:   [birth year]   MRN:   [id number]     Date/Time:  01/05/2019  Subjective:     CHIQUETTA LANGNER is a 85 y.o. female here today for follow up for meningioma, status post resection, headache, leg pain, numbness and tingling sensation  Patient's niece was accompanied during the virtual visit.  She says she still having a lot of pain, mostly in the legs, pain is described as sharp in nature and tend to wake patient up at night.  She denies any recent fall..  She says she is taking Lyrica but is not helping much with the pain, the burning pain in the legs is still severe, patient says tramadol helps with the pain by taking of the age.  She is currently taking Lyrica 50 mg instead of 100 mg twice daily.  I will adjust Lyrica to 100 mg p.o. twice daily to see if it will help with the pain better.  She will continue with rest of medication.  Headache has improved , according to patient, headache has not been as frequent as it used to be.  Medication has been helpful.  Headache is throbbing in nature mostly on one side that is the left side, associated with blurry vision.  She however still experiences occasional blurry vision.   She however, denies loss of consciousness dysphagia or odynophagia.   Review of Systems - General ROS: positive for  - fatigue and sleep disturbance  Psychological ROS: positive for - anxiety and sleep disturbances  Ophthalmic ROS: positive for - blurry vision, decreased vision and photophobia   ENT ROS: positive for - headaches, tinnitus and visual changes  Allergy and Immunology ROS: negative  Hematological and Lymphatic ROS: negative  Endocrine ROS: negative  Respiratory ROS: no cough, shortness of breath, or wheezing  Cardiovascular ROS: no chest pain or dyspnea on exertion  Gastrointestinal ROS: no abdominal pain, change in bowel habits, or black or bloody stools  Genito-Urinary ROS: no dysuria, trouble voiding, or hematuria  Musculoskeletal ROS: Muscle weakness, joint pains  Neurological ROS: Headaches, numbness and tingling sensation,dizziness.  Dermatological ROS: negative       Medications reviewed:  Current Outpatient Medications   Medication Sig Dispense Refill   ??? pregabalin (LYRICA) 100 mg capsule Take 1 Cap by mouth two (2) times a day. Max Daily Amount: 200 mg. 60 Cap 3   ??? cilostazoL (PLETAL) 100 mg tablet TAKE 1 TABLET BY MOUTH TWICE DAILY ON AN EMPTY STOMACH 180 Tab 0   ??? omeprazole (PRILOSEC) 40 mg capsule TAKE 1 CAPSULE BY MOUTH EVERY DAY 90 Cap 3   ??? promethazine (PHENERGAN) 12.5 mg tablet Take 1 Tab by mouth every six (6) hours as needed for Nausea. 30 Tab 0   ??? folic acid (FOLVITE) 1 mg tablet Take 1 Tab by mouth daily. 30 Tab 3   ??? ezetimibe (ZETIA) 10 mg tablet TAKE 1 TABLET BY MOUTH DAILY 90 Tab 1   ??? traMADol (ULTRAM) 50 mg  tablet every eight (8) hours as needed. 1/2 tablet     ??? oxybutynin (DITROPAN) 5 mg tablet TAKE 1 TABLET BY MOUTH TWICE DAILY 180 Tab 1   ??? alendronate (FOSAMAX) 70 mg tablet TAKE 1 TABLET BY MOUTH EVERY 7 DAYS 4 Tab 5   ??? acetaminophen (TYLENOL) 500 mg tablet Take 1,000 mg by mouth every six (6) hours as needed for Pain.     ??? estradiol (VAGIFEM) 10 mcg tab vaginal tablet INSERT 1 T VAGINALLY TWICE PER WEEK  3   ??? isosorbide dinitrate (ISORDIL) 10 mg tablet TAKE 1 TABLET BY MOUTH TWICE DAILY 180 Tab 0   ??? polyethylene glycol (MIRALAX) 17 gram/dose powder TAKE 1 CAPFUL PO ONCE A DAY  11   ??? aspirin 81 mg chewable tablet Take 81 mg by mouth daily.      ??? verapamil ER (CALAN-SR) 180 mg CR tablet TAKE 1 TABLET BY MOUTH EVERY NIGHT 90 Tab 0   ??? famotidine (PEPCID) 20 mg tablet Take 1 Tab by mouth two (2) times a day. 60 Tab 3   ??? lisinopril (PRINIVIL) 10 mg tablet Take 1 Tab by mouth daily. 30 Tab 0        Objective:   Vitals:  There were no vitals filed for this visit.    Lab Data Reviewed:  Lab Results   Component Value Date/Time    WBC 3.7 06/16/2018 04:49 PM    HCT 35.5 06/16/2018 04:49 PM    HGB 11.4 06/16/2018 04:49 PM    PLATELET 268 06/16/2018 04:49 PM       Lab Results   Component Value Date/Time    Sodium 143 06/16/2018 04:49 PM    Potassium 4.6 06/16/2018 04:49 PM    Chloride 106 06/16/2018 04:49 PM    CO2 21 06/16/2018 04:49 PM    Glucose 87 06/16/2018 04:49 PM    BUN 13 06/16/2018 04:49 PM    Creatinine 0.80 06/16/2018 04:49 PM    Calcium 9.3 06/16/2018 04:49 PM       No components found for: TROPQUANT    No results found for: ANA      No results found for: HBA1C, HGBE8, HBA1CPOC, HBA1CEXT     Lab Results   Component Value Date/Time    Vitamin B12 668 09/04/2018 01:07 PM       No results found for: ANA, Phillip Heal, XBANA    Lab Results   Component Value Date/Time    Cholesterol, total 158 06/16/2018 04:49 PM    HDL Cholesterol 57 06/16/2018 04:49 PM    LDL, calculated 87 06/16/2018 04:49 PM    VLDL, calculated 14 06/16/2018 04:49 PM    Triglyceride 72 06/16/2018 04:49 PM         CT Results (recent):  Results from [facility] encounter on 05/15/17   CT HEAD WO CONT    Narrative EXAM:  CT HEAD WO CONT  Clinical history: Intractable headache  INDICATION:   intractable headache    COMPARISON: 11/30/2016.    CONTRAST:  None.    TECHNIQUE: Unenhanced CT of the head was performed using 5 mm images. Brain and  bone windows were generated.  CT dose reduction was achieved through use of a  standardized protocol tailored for this examination and automatic exposure  control for dose modulation.      FINDINGS:   The ventricles and sulci are normal in size, shape and configuration and  midline. Scattered hypodensities in the cerebral white matter.. There is  no  intracranial hemorrhage, extra-axial collection,. Pituitary mass lesion likely  microadenoma.  Sulcal and ventricular prominence.. No acute infarct is  identified. The bone windows demonstrate no abnormalities. The visualized  portions of the paranasal sinuses and mastoid air cells are clear.      Impression IMPRESSION:     No acute intracranial process.    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           MRI Results (recent):  Results from [place] encounter on 06/05/16   MRI BRAIN W WO CONT    Narrative INDICATION:  ADENOMA, HEADACCE     EXAMINATION:  MRI BRAIN, PITUITARY, W/WO CONTRAST    COMPARISON:  CT May 23, 2016    TECHNIQUE:  MR imaging of the brain was performed with particular attention to  the pituitary gland including sagittal T1, axial T1, T2, FLAIR, DWI/ADC; Pre and  post contrast dynamic imaging was performed using 7 mL of gadolinium with  particular attention to the sella turcica.    FINDINGS:      Ventricles:  Prominence of the ventricles, sulci and cisterns related to  underlying atrophy.    Intracranial Hemorrhage:  None.    Brain Parenchyma/Brainstem:  Mild chronic white matter disease in the  supratentorial brain. No acute infarction.  Basal Cisterns:  Normal.   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 This displaces normal pituitary tissue and infundibulum toward the  left. This abuts and slightly deforms the optic chiasm again just right of  midline. Mass extends toward the right cavernous sinus, though without definite  invasion  Flow Voids:  Normal.  Post Contrast:  No abnormal parenchymal or meningeal enhancement.  Additional Comments:  N/A.      Impression IMPRESSION:     1. 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  2. Mild chronic white matter disease with no acute abnormality         IR Results (recent):  No results found for this or any previous visit.    VAS/US Results (recent):  Results from Hospital Encounter encounter on 04/24/16   DUPLEX LOWER EXT VENOUS RIGHT    Narrative HISTORY: Right leg edema and pain.     PROCEDURE: Multiple real time duplex and color Doppler interrogation of the  right lower extremity deep venous system were performed.    FINDINGS: The veins are compressible and demonstrate normal phasicity and  augmentation. No filling defects are identified to suggest deep venous  thrombosis. The veins of the groin and thigh and knee and calf were evaluated.    However, there is noted to be a right suprapatellar joint effusion with debris  measuring 6.1 x 1.6 cm.      Impression IMPRESSION:    No evidence of deep venous thrombosis.    Left suprapatellar joint effusion with debris.         PHYSICAL EXAM:  General:    Alert, cooperative, no distress, appears stated age.     Head:   Normocephalic, without obvious abnormality, atraumatic.  Eyes:   Conjunctivae/corneas clear.   Nose:  Nares normal. .  Throat:    Lips and tongue normal.  No Thrush  Neck:  Supple, symmetrical,  no adenopathy, thyroid.     no JVD.  Back:  Symmetric.  Lungs:   Deferred..  Chest wall:  No tenderness or deformity.Marland Kitchen  Heart:   Deferred.  Abdomen:   Not distended.   Extremities: Extremities normal, atraumatic, No cyanosis.  No edema.    Skin:      No rashes or lesions.    Lymph nodes: Cervical, supraclavicular normal.  Psych:  Good insight.  Not depressed.  Not anxious or agitated.      NEUROLOGICAL EXAM:  Appearance:  The patient is well developed, well nourished, provides a coherent history and is in no acute distress.   Mental Status: Oriented to time, place and person. Mood and affect appropriate.    Cranial Nerves:   Intact visual fields.  EOM's full, no nystagmus, no ptosis.  Facial movement is symmetric. Hearing is normal bilaterally.Tongue is midline.   Motor:   Moves all extremities.  No fasciculations.   Reflexes:   Deferred.   Sensory:   Deferred.   Gait:  Not tested.   Tremor:   No tremor noted.   Cerebellar:  No cerebellar signs present.           Assesment  1. Neuropathy    - pregabalin (LYRICA) 100 mg capsule; Take 1 Cap by mouth two (2) times a day. Max Daily Amount: 200 mg.  Dispense: 60 Cap; Refill: 3    2. Polyneuropathy  Current management  3. Bilateral foot-drop  Continue management  4. Chronic pain syndrome  Tramadol 10 mg p.o. PRN  5. Gait disorder  Physical therapy    6. Headache disorder  Stable    7. Meningioma ([place])  Stable    ___________________________________________________  PLAN: Additional plan discussed with patient and niece      ICD-10-CM ICD-9-CM    1. Polyneuropathy G62.9 356.9    2. Neuropathy G62.9 355.9 pregabalin (LYRICA) 100 mg capsule   3. Bilateral foot-drop M21.371 736.79     M21.372     4. Chronic pain syndrome G89.4 338.4    5. Gait disorder R26.9 781.2    6. Headache disorder R51 784.0    7. Meningioma ([facility]) D32.9 225.2      Follow-up and Dispositions    ?? Return in about 3 months (around 04/07/2019).       Pursuant to the emergency declaration under the [place], 1135 waiver authority and the [employer] Act, this Virtual  Visit was conducted, with patient's consent, to reduce the patient's risk of exposure to COVID-19 and provide continuity of care for an established patient.     Services were provided through a video synchronous discussion virtually to substitute for in-person clinic visit.  ___________________________________________________    Attending Physician: Nelwyn Salisbury, MD

## 2019-01-05 NOTE — Patient Instructions (Signed)
Office Policies  o Phone calls/patient messages:  Please allow up to 24 hours for someone in the office to contact you about your call or message. Be mindful your provider may be out of the office or your message may require further review. We encourage you to use MyChart for your messages as this is a faster, more efficient way to communicate with our office  o Medication Refills:  Prescription medications require up to 48 business hours to process. We encourage you to use MyChart for your refills.   For controlled medications: Please allow up to 72 business hours to process. Certain medications may require you to pick up a written prescription at our office.  NO narcotic/controlled medications will be prescribed after 4pm Monday through Friday or on weekends  o Form/Paperwork Completion:   We ask that you allow 7-14 business days. You may also download your forms to MyChart to have your doctor print off.         A Healthy Lifestyle: Care Instructions  Your Care Instructions     A healthy lifestyle can help you feel good, stay at a healthy weight, and have plenty of energy for both work and play. A healthy lifestyle is something you can share with your whole family.  A healthy lifestyle also can lower your risk for serious health problems, such as high blood pressure, heart disease, and diabetes.  You can follow a few steps listed below to improve your health and the health of your family.  Follow-up care is a key part of your treatment and safety. Be sure to make and go to all appointments, and call your doctor if you are having problems. It's also a good idea to know your test results and keep a list of the medicines you take.  How can you care for yourself at home?  ?? Do not eat too much sugar, fat, or fast foods. You can still have dessert and treats now and then. The goal is moderation.  ?? Start small to improve your eating habits. Pay attention to portion  sizes, drink less juice and soda pop, and eat more fruits and vegetables.  ? Eat a healthy amount of food. A 3-ounce serving of meat, for example, is about the size of a deck of cards. Fill the rest of your plate with vegetables and whole grains.  ? Limit the amount of soda and sports drinks you have every day. Drink more water when you are thirsty.  ? Eat at least 5 servings of fruits and vegetables every day. It may seem like a lot, but it is not hard to reach this goal. A serving or helping is 1 piece of fruit, 1 cup of vegetables, or 2 cups of leafy, raw vegetables. Have an apple or some carrot sticks as an afternoon snack instead of a candy bar. Try to have fruits and/or vegetables at every meal.  ?? Make exercise part of your daily routine. You may want to start with simple activities, such as walking, bicycling, or slow swimming. Try to be active 30 to 60 minutes every day. You do not need to do all 30 to 60 minutes all at once. For example, you can exercise 3 times a day for 10 or 20 minutes. Moderate exercise is safe for most people, but it is always a good idea to talk to your doctor before starting an exercise program.  ?? Keep moving. Mow the lawn, work in the garden, or clean your house. Take   the stairs instead of the elevator at work.  ?? If you smoke, quit. People who smoke have an increased risk for heart attack, stroke, cancer, and other lung illnesses. Quitting is hard, but there are ways to boost your chance of quitting tobacco for good.  ? Use nicotine gum, patches, or lozenges.  ? Ask your doctor about stop-smoking programs and medicines.  ? Keep trying.  In addition to reducing your risk of diseases in the future, you will notice some benefits soon after you stop using tobacco. If you have shortness of breath or asthma symptoms, they will likely get better within a few weeks after you quit.  ?? Limit how much alcohol you drink. Moderate amounts of alcohol (up to 2  drinks a day for men, 1 drink a day for women) are okay. But drinking too much can lead to liver problems, high blood pressure, and other health problems.  Family health  If you have a family, there are many things you can do together to improve your health.  ?? Eat meals together as a family as often as possible.  ?? Eat healthy foods. This includes fruits, vegetables, lean meats and dairy, and whole grains.  ?? Include your family in your fitness plan. Most people think of activities such as jogging or tennis as the way to fitness, but there are many ways you and your family can be more active. Anything that makes you breathe hard and gets your heart pumping is exercise. Here are some tips:  ? Walk to do errands or to take your child to school or the bus.  ? Go for a family bike ride after dinner instead of watching TV.  Where can you learn more?  Go to http://www.healthwise.net/[employer]  Enter U807 in the search box to learn more about "A Healthy Lifestyle: Care Instructions."  Current as of: September 05, 2018??????????????????????????????Content Version: 12.5  ?? 2006-2020 [employer].   Care instructions adapted under license by [employer] (which disclaims liability or warranty for this information). If you have questions about a medical condition or this instruction, always ask your healthcare professional. Healthwise, Incorporated disclaims any warranty or liability for your use of this information.

## 2019-01-05 NOTE — Progress Notes (Signed)
Neurology Progress Note  Kimberly [name] was seen by synchronous (real-time) audio-video technology on 01/05/19.     Consent:  She and/or her healthcare decision maker is aware that this patient-initiated Telehealth encounter is a billable service, with coverage as determined by her insurance carrier. She is aware that she may receive a bill and has provided verbal consent to proceed: Yes    I was in the office while conducting this encounter.      NAME:  Kimberly [name]   DOB:   1924-03-22   MRN:   [id number]     Date/Time:  01/05/2019  Subjective:     Kimberly [name] is a 85 y.o. female here today for follow up for meningioma, status post resection, headache, leg pain, numbness and tingling sensation  Patient's niece was accompanied during the virtual visit.  She says she still having a lot of pain, mostly in the legs, pain is described as sharp in nature and tend to wake patient up at night.  She denies any recent fall..  She says she is taking Lyrica but is not helping much with the pain, the burning pain in the legs is still severe, patient says tramadol helps with the pain by taking of the age.  She is currently taking Lyrica 50 mg instead of 100 mg twice daily.  I will adjust Lyrica to 100 mg p.o. twice daily to see if it will help with the pain better.  She will continue with rest of medication.  Headache has improved , according to patient, headache has not been as frequent as it used to be.  Medication has been helpful.  Headache is throbbing in nature mostly on one side that is the left side, associated with blurry vision.  She however still experiences occasional blurry vision.   She however, denies loss of consciousness dysphagia or odynophagia.   Review of Systems - General ROS: positive for  - fatigue and sleep disturbance  Psychological ROS: positive for - anxiety and sleep disturbances  Ophthalmic ROS: positive for - blurry vision, decreased vision and photophobia  ENT ROS: positive for - headaches,  tinnitus and visual changes  Allergy and Immunology ROS: negative  Hematological and Lymphatic ROS: negative  Endocrine ROS: negative  Respiratory ROS: no cough, shortness of breath, or wheezing  Cardiovascular ROS: no chest pain or dyspnea on exertion  Gastrointestinal ROS: no abdominal pain, change in bowel habits, or black or bloody stools  Genito-Urinary ROS: no dysuria, trouble voiding, or hematuria  Musculoskeletal ROS: Muscle weakness, joint pains  Neurological ROS: Headaches, numbness and tingling sensation,dizziness.  Dermatological ROS: negative       Medications reviewed:  Current Outpatient Medications   Medication Sig Dispense Refill   ??? pregabalin (LYRICA) 100 mg capsule Take 1 Cap by mouth two (2) times a day. Max Daily Amount: 200 mg. 60 Cap 3   ??? cilostazoL (PLETAL) 100 mg tablet TAKE 1 TABLET BY MOUTH TWICE DAILY ON AN EMPTY STOMACH 180 Tab 0   ??? omeprazole (PRILOSEC) 40 mg capsule TAKE 1 CAPSULE BY MOUTH EVERY DAY 90 Cap 3   ??? promethazine (PHENERGAN) 12.5 mg tablet Take 1 Tab by mouth every six (6) hours as needed for Nausea. 30 Tab 0   ??? folic acid (FOLVITE) 1 mg tablet Take 1 Tab by mouth daily. 30 Tab 3   ??? ezetimibe (ZETIA) 10 mg tablet TAKE 1 TABLET BY MOUTH DAILY 90 Tab 1   ??? traMADol (ULTRAM) 50 mg  tablet every eight (8) hours as needed. 1/2 tablet     ??? oxybutynin (DITROPAN) 5 mg tablet TAKE 1 TABLET BY MOUTH TWICE DAILY 180 Tab 1   ??? alendronate (FOSAMAX) 70 mg tablet TAKE 1 TABLET BY MOUTH EVERY 7 DAYS 4 Tab 5   ??? acetaminophen (TYLENOL) 500 mg tablet Take 1,000 mg by mouth every six (6) hours as needed for Pain.     ??? estradiol (VAGIFEM) 10 mcg tab vaginal tablet INSERT 1 T VAGINALLY TWICE PER WEEK  3   ??? isosorbide dinitrate (ISORDIL) 10 mg tablet TAKE 1 TABLET BY MOUTH TWICE DAILY 180 Tab 0   ??? polyethylene glycol (MIRALAX) 17 gram/dose powder TAKE 1 CAPFUL PO ONCE A DAY  11   ??? aspirin 81 mg chewable tablet Take 81 mg by mouth daily.     ??? verapamil ER (CALAN-SR) 180 mg CR tablet  TAKE 1 TABLET BY MOUTH EVERY NIGHT 90 Tab 0   ??? famotidine (PEPCID) 20 mg tablet Take 1 Tab by mouth two (2) times a day. 60 Tab 3   ??? lisinopril (PRINIVIL) 10 mg tablet Take 1 Tab by mouth daily. 30 Tab 0        Objective:   Vitals:  There were no vitals filed for this visit.    Lab Data Reviewed:  Lab Results   Component Value Date/Time    WBC 3.7 06/16/2018 04:49 PM    HCT 35.5 06/16/2018 04:49 PM    HGB 11.4 06/16/2018 04:49 PM    PLATELET 268 06/16/2018 04:49 PM       Lab Results   Component Value Date/Time    Sodium 143 06/16/2018 04:49 PM    Potassium 4.6 06/16/2018 04:49 PM    Chloride 106 06/16/2018 04:49 PM    CO2 21 06/16/2018 04:49 PM    Glucose 87 06/16/2018 04:49 PM    BUN 13 06/16/2018 04:49 PM    Creatinine 0.80 06/16/2018 04:49 PM    Calcium 9.3 06/16/2018 04:49 PM       No components found for: TROPQUANT    No results found for: ANA      No results found for: HBA1C, HGBE8, HBA1CPOC, HBA1CEXT     Lab Results   Component Value Date/Time    Vitamin B12 668 09/04/2018 01:07 PM       No results found for: ANA, Phillip Heal, XBANA    Lab Results   Component Value Date/Time    Cholesterol, total 158 06/16/2018 04:49 PM    HDL Cholesterol 57 06/16/2018 04:49 PM    LDL, calculated 87 06/16/2018 04:49 PM    VLDL, calculated 14 06/16/2018 04:49 PM    Triglyceride 72 06/16/2018 04:49 PM         CT Results (recent):  Results from [place] encounter on 05/15/17   CT HEAD WO CONT    Narrative EXAM:  CT HEAD WO CONT  Clinical history: Intractable headache  INDICATION:   intractable headache    COMPARISON: 11/30/2016.    CONTRAST:  None.    TECHNIQUE: Unenhanced CT of the head was performed using 5 mm images. Brain and  bone windows were generated.  CT dose reduction was achieved through use of a  standardized protocol tailored for this examination and automatic exposure  control for dose modulation.      FINDINGS:  The ventricles and sulci are normal in size, shape and configuration and  midline.  Scattered hypodensities in the cerebral white matter.. There is  no  intracranial hemorrhage, extra-axial collection,. Pituitary mass lesion likely  microadenoma.  Sulcal and ventricular prominence.. No acute infarct is  identified. The bone windows demonstrate no abnormalities. The visualized  portions of the paranasal sinuses and mastoid air cells are clear.      Impression IMPRESSION:     No acute intracranial process.    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           MRI Results (recent):  Results from [facility] encounter on 06/05/16   MRI BRAIN W WO CONT    Narrative INDICATION:  ADENOMA, HEADACCE     EXAMINATION:  MRI BRAIN, PITUITARY, W/WO CONTRAST    COMPARISON:  CT May 23, 2016    TECHNIQUE:  MR imaging of the brain was performed with particular attention to  the pituitary gland including sagittal T1, axial T1, T2, FLAIR, DWI/ADC; Pre and  post contrast dynamic imaging was performed using 7 mL of gadolinium with  particular attention to the sella turcica.    FINDINGS:      Ventricles:  Prominence of the ventricles, sulci and cisterns related to  underlying atrophy.    Intracranial Hemorrhage:  None.    Brain Parenchyma/Brainstem:  Mild chronic white matter disease in the  supratentorial brain. No acute infarction.  Basal Cisterns:  Normal.   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 This displaces normal pituitary tissue and infundibulum toward the  left. This abuts and slightly deforms the optic chiasm again just right of  midline. Mass extends toward the right cavernous sinus, though without definite  invasion  Flow Voids:  Normal.  Post Contrast:  No abnormal parenchymal or meningeal enhancement.  Additional Comments:  N/A.      Impression IMPRESSION:    1. 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  2. Mild chronic white matter disease with no acute abnormality         IR Results (recent):  No results found for this or any previous visit.    VAS/US Results (recent):  Results from Hospital Encounter encounter on 04/24/16   DUPLEX LOWER EXT VENOUS RIGHT    Narrative HISTORY: Right leg edema and pain.     PROCEDURE: Multiple real time duplex and color Doppler interrogation of the  right lower extremity deep venous system were performed.    FINDINGS: The veins are compressible and demonstrate normal phasicity and  augmentation. No filling defects are identified to suggest deep venous  thrombosis. The veins of the groin and thigh and knee and calf were evaluated.    However, there is noted to be a right suprapatellar joint effusion with debris  measuring 6.1 x 1.6 cm.      Impression IMPRESSION:    No evidence of deep venous thrombosis.    Left suprapatellar joint effusion with debris.         PHYSICAL EXAM:  General:    Alert, cooperative, no distress, appears stated age.     Head:   Normocephalic, without obvious abnormality, atraumatic.  Eyes:   Conjunctivae/corneas clear.   Nose:  Nares normal. .  Throat:    Lips and tongue normal.  No Thrush  Neck:  Supple, symmetrical,  no adenopathy, thyroid.     no JVD.  Back:  Symmetric.  Lungs:   Deferred..  Chest wall:  No tenderness or deformity.Marland Kitchen  Heart:   Deferred.  Abdomen:   Not distended.   Extremities: Extremities normal, atraumatic, No cyanosis.  No edema.    Skin:      No rashes or lesions.    Lymph nodes: Cervical, supraclavicular normal.  Psych:  Good insight.  Not depressed.  Not anxious or agitated.      NEUROLOGICAL EXAM:  Appearance:  The patient is well developed, well nourished, provides a coherent history and is in no acute distress.   Mental Status: Oriented to time, place and person. Mood and affect appropriate.   Cranial Nerves:   Intact visual fields.  EOM's full, no nystagmus, no ptosis.  Facial  movement is symmetric. Hearing is normal bilaterally.Tongue is midline.   Motor:   Moves all extremities.  No fasciculations.   Reflexes:   Deferred.   Sensory:   Deferred.   Gait:  Not tested.   Tremor:   No tremor noted.   Cerebellar:  No cerebellar signs present.           Assesment  1. Neuropathy    - pregabalin (LYRICA) 100 mg capsule; Take 1 Cap by mouth two (2) times a day. Max Daily Amount: 200 mg.  Dispense: 60 Cap; Refill: 3    2. Polyneuropathy  Current management  3. Bilateral foot-drop  Continue management  4. Chronic pain syndrome  Tramadol 10 mg p.o. PRN  5. Gait disorder  Physical therapy    6. Headache disorder  Stable    7. Meningioma ([place])  Stable    ___________________________________________________  PLAN: Additional plan discussed with patient and niece      ICD-10-CM ICD-9-CM    1. Polyneuropathy G62.9 356.9    2. Neuropathy G62.9 355.9 pregabalin (LYRICA) 100 mg capsule   3. Bilateral foot-drop M21.371 736.79     M21.372     4. Chronic pain syndrome G89.4 338.4    5. Gait disorder R26.9 781.2    6. Headache disorder R51 784.0    7. Meningioma ([facility]) D32.9 225.2      Follow-up and Dispositions    ?? Return in about 3 months (around 04/07/2019).       Pursuant to the emergency declaration under the [place], 1135 waiver authority and the [employer] Act, this Virtual  Visit was conducted, with patient's consent, to reduce the patient's risk of exposure to COVID-19 and provide continuity of care for an established patient.     Services were provided through a video synchronous discussion virtually to substitute for in-person clinic visit.  ___________________________________________________    Attending Physician: Nelwyn Salisbury, MD

## 2019-01-12 ENCOUNTER — Inpatient Hospital Stay: Admit: 2019-01-12 | Payer: MEDICARE | Attending: Surgery | Primary: Internal Medicine

## 2019-01-12 DIAGNOSIS — Z853 Personal history of malignant neoplasm of breast: Secondary | ICD-10-CM

## 2019-01-30 ENCOUNTER — Encounter

## 2019-02-05 MED ORDER — FOLIC ACID 1 MG TAB
1 mg | ORAL_TABLET | Freq: Every day | ORAL | 3 refills | Status: AC
Start: 2019-02-05 — End: ?

## 2019-02-05 NOTE — Telephone Encounter (Signed)
Virtual visit 01/05/19, next office visit 04/07/19

## 2019-03-09 MED ORDER — OXYBUTYNIN CHLORIDE 5 MG TAB
5 mg | ORAL_TABLET | ORAL | 1 refills | Status: DC
Start: 2019-03-09 — End: 2019-09-10

## 2019-03-22 MED ORDER — EZETIMIBE 10 MG TAB
10 mg | ORAL_TABLET | ORAL | 1 refills | Status: DC
Start: 2019-03-22 — End: 2020-02-12

## 2019-03-22 MED ORDER — CILOSTAZOL 100 MG TAB
100 mg | ORAL_TABLET | ORAL | 0 refills | Status: DC
Start: 2019-03-22 — End: 2019-06-12

## 2019-03-23 MED ORDER — VERAPAMIL ER 180 MG TAB
180 mg | ORAL_TABLET | ORAL | 0 refills | Status: DC
Start: 2019-03-23 — End: 2019-06-12

## 2019-03-27 ENCOUNTER — Encounter: Attending: Surgery | Primary: Internal Medicine

## 2019-04-07 ENCOUNTER — Ambulatory Visit: Admit: 2019-04-07 | Discharge: 2019-04-07 | Payer: MEDICARE | Attending: Neurology | Primary: Internal Medicine

## 2019-04-07 ENCOUNTER — Ambulatory Visit: Attending: Neurology | Primary: Family Medicine

## 2019-04-07 DIAGNOSIS — G629 Polyneuropathy, unspecified: Secondary | ICD-10-CM

## 2019-04-07 MED ORDER — PREGABALIN 150 MG CAP
150 mg | ORAL_CAPSULE | Freq: Two times a day (BID) | ORAL | 2 refills | Status: DC
Start: 2019-04-07 — End: 2020-05-03

## 2019-04-07 NOTE — Progress Notes (Signed)
Neurology Progress Note    NAME:  Kimberly [name]   DOB:   1923/11/04   MRN:   [id number]     Date/Time:  04/07/2019  Subjective:    Kimberly [name] is a 85 y.o. female here today for follow up for meningioma, status post resection, headache, leg pain, numbness and tingling sensation  Patient was accompanied by health aide.  She says she still having a lot of pain, mostly in the legs, pain is described as sharp in nature and tend to wake patient up at night.  She denies any recent fall.  She says however the pain is much reduced after seeing some group known [facility]  It appears patient was not taking Lyrica that was adjusted 100 mg p.o. twice daily, states she was on 50 mg p.o. twice daily  I sent in prescription for 100 mg p.o. twice daily.  She will continue with rest of medication.  If she is getting help from the group, I told patient I do not have a problem with it.  Headache has improved , according to patient, she rarely has headache.  Medication has been helpful.  Headache is throbbing in nature mostly on one side that is the left side, associated with blurry vision.  She however still experiences occasional blurry vision.   She however, denies loss of consciousness dysphagia or odynophagia.   Review of Systems - General ROS: positive for  - fatigue and sleep disturbance  Psychological ROS: positive for - anxiety and sleep disturbances  Ophthalmic ROS: positive for - blurry vision, decreased vision and photophobia  ENT ROS: positive for - headaches, tinnitus and visual changes  Allergy and Immunology ROS: negative  Hematological and Lymphatic ROS: negative  Endocrine ROS: negative  Respiratory ROS: no cough, shortness of breath, or wheezing  Cardiovascular ROS: no chest pain or dyspnea on exertion  Gastrointestinal ROS: no abdominal pain, change in bowel habits, or black or bloody stools  Genito-Urinary ROS: no dysuria, trouble voiding, or hematuria   Musculoskeletal ROS: Muscle weakness, joint pains  Neurological ROS: Headaches, numbness and tingling sensation,dizziness.  Dermatological ROS: negative          Medications reviewed:  Current Outpatient Medications   Medication Sig Dispense Refill   ??? pregabalin (LYRICA) 150 mg capsule Take 1 Cap by mouth two (2) times a day. Max Daily Amount: 300 mg. 60 Cap 2   ??? verapamil ER (CALAN-SR) 180 mg CR tablet TAKE 1 TABLET BY MOUTH EVERY NIGHT 90 Tab 0   ??? cilostazoL (PLETAL) 100 mg tablet TAKE 1 TABLET BY MOUTH TWICE DAILY ON AN EMPTY STOMACH 180 Tab 0   ??? ezetimibe (ZETIA) 10 mg tablet TAKE 1 TABLET BY MOUTH DAILY 90 Tab 1   ??? oxybutynin (DITROPAN) 5 mg tablet TAKE 1 TABLET BY MOUTH TWICE DAILY 99991111 Tab 1   ??? folic acid (FOLVITE) 1 mg tablet Take 1 Tab by mouth daily. 90 Tab 3   ??? omeprazole (PRILOSEC) 40 mg capsule TAKE 1 CAPSULE BY MOUTH EVERY DAY 90 Cap 3   ??? famotidine (PEPCID) 20 mg tablet Take 1 Tab by mouth two (2) times a day. 60 Tab 3   ??? promethazine (PHENERGAN) 12.5 mg tablet Take 1 Tab by mouth every six (6) hours as needed for Nausea. (Patient not taking: Reported on 04/10/2019) 30 Tab 0   ??? traMADol (ULTRAM) 50 mg tablet every eight (8) hours as needed. 1/2 tablet     ???  alendronate (FOSAMAX) 70 mg tablet TAKE 1 TABLET BY MOUTH EVERY 7 DAYS 4 Tab 5   ??? acetaminophen (TYLENOL) 500 mg tablet Take 1,000 mg by mouth every six (6) hours as needed for Pain.     ??? estradiol (VAGIFEM) 10 mcg tab vaginal tablet INSERT 1 T VAGINALLY TWICE PER WEEK  3   ??? isosorbide dinitrate (ISORDIL) 10 mg tablet TAKE 1 TABLET BY MOUTH TWICE DAILY 180 Tab 0   ??? lisinopril (PRINIVIL) 10 mg tablet Take 1 Tab by mouth daily. 30 Tab 0   ??? aspirin 81 mg chewable tablet Take 81 mg by mouth daily.     ??? polyethylene glycol (MIRALAX) 17 gram/dose powder TAKE 1 CAPFUL PO ONCE A DAY  11        Objective:   Vitals:  Vitals:    04/07/19 1106   BP: 140/72   Pulse: 69   Resp: 18   Temp: 97.9 ??F (36.6 ??C)   SpO2: 99%    Weight: 126 lb 3.2 oz (57.2 kg)   Height: 5\' 1"  (1.549 m)   PainSc:   7   PainLoc: Leg               Lab Data Reviewed:  Lab Results   Component Value Date/Time    WBC 3.7 06/16/2018 04:49 PM    HCT 35.5 06/16/2018 04:49 PM    HGB 11.4 06/16/2018 04:49 PM    PLATELET 268 06/16/2018 04:49 PM       Lab Results   Component Value Date/Time    Sodium 143 06/16/2018 04:49 PM    Potassium 4.6 06/16/2018 04:49 PM    Chloride 106 06/16/2018 04:49 PM    CO2 21 06/16/2018 04:49 PM    Glucose 87 06/16/2018 04:49 PM    BUN 13 06/16/2018 04:49 PM    Creatinine 0.80 06/16/2018 04:49 PM    Calcium 9.3 06/16/2018 04:49 PM       No components found for: TROPQUANT    No results found for: ANA      No results found for: HBA1C, HGBE8, HBA1CPOC, HBA1CEXT     Lab Results   Component Value Date/Time    Vitamin B12 668 09/04/2018 01:07 PM       No results found for: ANA, Phillip Heal, XBANA    Lab Results   Component Value Date/Time    Cholesterol, total 158 06/16/2018 04:49 PM    HDL Cholesterol 57 06/16/2018 04:49 PM    LDL, calculated 87 06/16/2018 04:49 PM    VLDL, calculated 14 06/16/2018 04:49 PM    Triglyceride 72 06/16/2018 04:49 PM         CT Results (recent):  Results from [facility] encounter on 05/15/17   CT HEAD WO CONT    Narrative EXAM:  CT HEAD WO CONT  Clinical history: Intractable headache  INDICATION:   intractable headache    COMPARISON: 11/30/2016.    CONTRAST:  None.    TECHNIQUE: Unenhanced CT of the head was performed using 5 mm images. Brain and  bone windows were generated.  CT dose reduction was achieved through use of a  standardized protocol tailored for this examination and automatic exposure  control for dose modulation.      FINDINGS:  The ventricles and sulci are normal in size, shape and configuration and  midline. Scattered hypodensities in the cerebral white matter.. There is no  intracranial hemorrhage, extra-axial collection,. Pituitary mass lesion likely   microadenoma.  Sulcal and  ventricular prominence.. No acute infarct is  identified. The bone windows demonstrate no abnormalities. The visualized  portions of the paranasal sinuses and mastoid air cells are clear.      Impression IMPRESSION:     No acute intracranial process.    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           MRI Results (recent):  Results from [facility] encounter on 06/05/16   MRI BRAIN W WO CONT    Narrative INDICATION:  ADENOMA, HEADACCE     EXAMINATION:  MRI BRAIN, PITUITARY, W/WO CONTRAST    COMPARISON:  CT May 23, 2016    TECHNIQUE:  MR imaging of the brain was performed with particular attention to  the pituitary gland including sagittal T1, axial T1, T2, FLAIR, DWI/ADC; Pre and  post contrast dynamic imaging was performed using 7 mL of gadolinium with  particular attention to the sella turcica.    FINDINGS:      Ventricles:  Prominence of the ventricles, sulci and cisterns related to  underlying atrophy.    Intracranial Hemorrhage:  None.    Brain Parenchyma/Brainstem:  Mild chronic white matter disease in the  supratentorial brain. No acute infarction.  Basal Cisterns:  Normal.   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 This displaces normal pituitary tissue and infundibulum toward the  left. This abuts and slightly deforms the optic chiasm again just right of  midline. Mass extends toward the right cavernous sinus, though without definite  invasion  Flow Voids:  Normal.  Post Contrast:  No abnormal parenchymal or meningeal enhancement.  Additional Comments:  N/A.      Impression IMPRESSION:    1. 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  2. Mild chronic white matter disease with no acute abnormality         IR Results (recent):   No results found for this or any previous visit.    VAS/US Results (recent):  Results from Hospital Encounter encounter on 04/24/16   DUPLEX LOWER EXT VENOUS RIGHT    Narrative HISTORY: Right leg edema and pain.     PROCEDURE: Multiple real time duplex and color Doppler interrogation of the  right lower extremity deep venous system were performed.    FINDINGS: The veins are compressible and demonstrate normal phasicity and  augmentation. No filling defects are identified to suggest deep venous  thrombosis. The veins of the groin and thigh and knee and calf were evaluated.    However, there is noted to be a right suprapatellar joint effusion with debris  measuring 6.1 x 1.6 cm.      Impression IMPRESSION:    No evidence of deep venous thrombosis.    Left suprapatellar joint effusion with debris.         PHYSICAL EXAM:  General:    Alert, cooperative, no distress, appears stated age.     Head:   Normocephalic, without obvious abnormality, atraumatic.  Eyes:   Conjunctivae/corneas clear.  PERRLA  Nose:  Nares normal. No drainage or sinus tenderness.  Throat:    Lips, mucosa, and tongue normal.  No Thrush  Neck:  Supple, symmetrical,  no adenopathy, thyroid: non tender    no carotid bruit and no JVD.  Back:    Symmetric,  No CVA tenderness.  Lungs:   Clear to auscultation bilaterally.  No Wheezing or Rhonchi. No rales.  Chest wall:  No tenderness or deformity. No Accessory muscle use.  Heart:   Regular rate and rhythm,  no murmur, rub or gallop.  Abdomen:   Soft, non-tender. Not distended.  Bowel sounds normal. No masses  Extremities: Extremities normal, atraumatic, No cyanosis.  No edema. No clubbing  Skin:     Texture, turgor normal. No rashes or lesions.  Not Jaundiced  Lymph nodes: Cervical, supraclavicular normal.  Psych:  Good insight.  Not depressed.  Not anxious or agitated.      NEUROLOGICAL EXAM:  Appearance:  The patient is well developed, well nourished, provides a  coherent history and is in no acute distress.   Mental Status: Oriented to time, place and person. Mood and affect appropriate.   Cranial Nerves:   Intact visual fields. Fundi are benign. PERLA, EOM's full, no nystagmus, no ptosis. Facial sensation is normal. Corneal reflexes are intact. Facial movement is symmetric. Hearing is normal bilaterally. Palate is midline with normal sternocleidomastoid and trapezius muscles are normal. Tongue is midline.   Motor:  5/5 strength in upper and lower proximal and distal muscles. Normal bulk and tone. No fasciculations.   Reflexes:   Deep tendon reflexes 1+/4 and symmetrical.   Sensory:    Dysesthesia to touch, pinprick and vibration.   Gait:   Slightly unsteady l gait.   Tremor:   No tremor noted.   Cerebellar:  No cerebellar signs present.   Neurovascular:  Normal heart sounds and regular rhythm, peripheral pulses intact, and no carotid bruits.       Assesment  1. Polyneuropathy    - pregabalin (LYRICA) 150 mg capsule; Take 1 Cap by mouth two (2) times a day. Max Daily Amount: 300 mg.  Dispense: 60 Cap; Refill: 2    2. Gait disorder  Stable    3. Bilateral foot-drop  Stable    4. Meningioma (HCC)  Stable  5. Sensory neuropathy    - pregabalin (LYRICA) 150 mg capsule; Take 1 Cap by mouth two (2) times a day. Max Daily Amount: 300 mg.  Dispense: 60 Cap; Refill: 2    ___________________________________________________  PLAN: Medication and plan discussed with patient and her caregiver      ICD-10-CM ICD-9-CM    1. Polyneuropathy  G62.9 356.9 pregabalin (LYRICA) 150 mg capsule   2. Gait disorder  R26.9 781.2    3. Bilateral foot-drop  M21.371 736.79     M21.372     4. Meningioma (HCC)  D32.9 225.2    5. Sensory neuropathy  G62.9 356.9 pregabalin (LYRICA) 150 mg capsule     Follow-up and Dispositions    ?? Return in about 3 months (around 07/07/2019).       :    ___________________________________________________    Attending Physician: Nelwyn Salisbury, MD

## 2019-04-07 NOTE — Progress Notes (Signed)
Neurology Progress Note    NAME:  Kimberly [name]   DOB:   1923-10-21   MRN:   [id number]     Date/Time:  04/07/2019  Subjective:    Kimberly [name] is a 85 y.o. female here today for follow up for meningioma, status post resection, headache, leg pain, numbness and tingling sensation  Patient was accompanied by health aide.  She says she still having a lot of pain, mostly in the legs, pain is described as sharp in nature and tend to wake patient up at night.  She denies any recent fall.  She says however the pain is much reduced after seeing some group known [facility]  It appears patient was not taking Lyrica that was adjusted 100 mg p.o. twice daily, states she was on 50 mg p.o. twice daily  I sent in prescription for 100 mg p.o. twice daily.  She will continue with rest of medication.  If she is getting help from the group, I told patient I do not have a problem with it.  Headache has improved , according to patient, she rarely has headache.  Medication has been helpful.  Headache is throbbing in nature mostly on one side that is the left side, associated with blurry vision.  She however still experiences occasional blurry vision.   She however, denies loss of consciousness dysphagia or odynophagia.   Review of Systems - General ROS: positive for  - fatigue and sleep disturbance  Psychological ROS: positive for - anxiety and sleep disturbances  Ophthalmic ROS: positive for - blurry vision, decreased vision and photophobia  ENT ROS: positive for - headaches, tinnitus and visual changes  Allergy and Immunology ROS: negative  Hematological and Lymphatic ROS: negative  Endocrine ROS: negative  Respiratory ROS: no cough, shortness of breath, or wheezing  Cardiovascular ROS: no chest pain or dyspnea on exertion  Gastrointestinal ROS: no abdominal pain, change in bowel habits, or black or bloody stools  Genito-Urinary ROS: no dysuria, trouble voiding, or hematuria  Musculoskeletal ROS: Muscle weakness,  joint pains  Neurological ROS: Headaches, numbness and tingling sensation,dizziness.  Dermatological ROS: negative          Medications reviewed:  Current Outpatient Medications   Medication Sig Dispense Refill   ??? pregabalin (LYRICA) 150 mg capsule Take 1 Cap by mouth two (2) times a day. Max Daily Amount: 300 mg. 60 Cap 2   ??? verapamil ER (CALAN-SR) 180 mg CR tablet TAKE 1 TABLET BY MOUTH EVERY NIGHT 90 Tab 0   ??? cilostazoL (PLETAL) 100 mg tablet TAKE 1 TABLET BY MOUTH TWICE DAILY ON AN EMPTY STOMACH 180 Tab 0   ??? ezetimibe (ZETIA) 10 mg tablet TAKE 1 TABLET BY MOUTH DAILY 90 Tab 1   ??? oxybutynin (DITROPAN) 5 mg tablet TAKE 1 TABLET BY MOUTH TWICE DAILY 99991111 Tab 1   ??? folic acid (FOLVITE) 1 mg tablet Take 1 Tab by mouth daily. 90 Tab 3   ??? omeprazole (PRILOSEC) 40 mg capsule TAKE 1 CAPSULE BY MOUTH EVERY DAY 90 Cap 3   ??? famotidine (PEPCID) 20 mg tablet Take 1 Tab by mouth two (2) times a day. 60 Tab 3   ??? promethazine (PHENERGAN) 12.5 mg tablet Take 1 Tab by mouth every six (6) hours as needed for Nausea. (Patient not taking: Reported on 04/10/2019) 30 Tab 0   ??? traMADol (ULTRAM) 50 mg tablet every eight (8) hours as needed. 1/2 tablet     ???  alendronate (FOSAMAX) 70 mg tablet TAKE 1 TABLET BY MOUTH EVERY 7 DAYS 4 Tab 5   ??? acetaminophen (TYLENOL) 500 mg tablet Take 1,000 mg by mouth every six (6) hours as needed for Pain.     ??? estradiol (VAGIFEM) 10 mcg tab vaginal tablet INSERT 1 T VAGINALLY TWICE PER WEEK  3   ??? isosorbide dinitrate (ISORDIL) 10 mg tablet TAKE 1 TABLET BY MOUTH TWICE DAILY 180 Tab 0   ??? lisinopril (PRINIVIL) 10 mg tablet Take 1 Tab by mouth daily. 30 Tab 0   ??? aspirin 81 mg chewable tablet Take 81 mg by mouth daily.     ??? polyethylene glycol (MIRALAX) 17 gram/dose powder TAKE 1 CAPFUL PO ONCE A DAY  11        Objective:   Vitals:  Vitals:    04/07/19 1106   BP: 140/72   Pulse: 69   Resp: 18   Temp: 97.9 ??F (36.6 ??C)   SpO2: 99%   Weight: 126 lb 3.2 oz (57.2 kg)   Height: 5\' 1"  (1.549 m)   PainSc:    7   PainLoc: Leg               Lab Data Reviewed:  Lab Results   Component Value Date/Time    WBC 3.7 06/16/2018 04:49 PM    HCT 35.5 06/16/2018 04:49 PM    HGB 11.4 06/16/2018 04:49 PM    PLATELET 268 06/16/2018 04:49 PM       Lab Results   Component Value Date/Time    Sodium 143 06/16/2018 04:49 PM    Potassium 4.6 06/16/2018 04:49 PM    Chloride 106 06/16/2018 04:49 PM    CO2 21 06/16/2018 04:49 PM    Glucose 87 06/16/2018 04:49 PM    BUN 13 06/16/2018 04:49 PM    Creatinine 0.80 06/16/2018 04:49 PM    Calcium 9.3 06/16/2018 04:49 PM       No components found for: TROPQUANT    No results found for: ANA      No results found for: HBA1C, HGBE8, HBA1CPOC, HBA1CEXT     Lab Results   Component Value Date/Time    Vitamin B12 668 09/04/2018 01:07 PM       No results found for: ANA, Phillip Heal, XBANA    Lab Results   Component Value Date/Time    Cholesterol, total 158 06/16/2018 04:49 PM    HDL Cholesterol 57 06/16/2018 04:49 PM    LDL, calculated 87 06/16/2018 04:49 PM    VLDL, calculated 14 06/16/2018 04:49 PM    Triglyceride 72 06/16/2018 04:49 PM         CT Results (recent):  Results from [facility] encounter on 05/15/17   CT HEAD WO CONT    Narrative EXAM:  CT HEAD WO CONT  Clinical history: Intractable headache  INDICATION:   intractable headache    COMPARISON: 11/30/2016.    CONTRAST:  None.    TECHNIQUE: Unenhanced CT of the head was performed using 5 mm images. Brain and  bone windows were generated.  CT dose reduction was achieved through use of a  standardized protocol tailored for this examination and automatic exposure  control for dose modulation.      FINDINGS:  The ventricles and sulci are normal in size, shape and configuration and  midline. Scattered hypodensities in the cerebral white matter.. There is no  intracranial hemorrhage, extra-axial collection,. Pituitary mass lesion likely  microadenoma.  Sulcal and ventricular  prominence.. No acute infarct is  identified. The bone windows  demonstrate no abnormalities. The visualized  portions of the paranasal sinuses and mastoid air cells are clear.      Impression IMPRESSION:     No acute intracranial process.    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           MRI Results (recent):  Results from [facility] encounter on 06/05/16   MRI BRAIN W WO CONT    Narrative INDICATION:  ADENOMA, HEADACCE     EXAMINATION:  MRI BRAIN, PITUITARY, W/WO CONTRAST    COMPARISON:  CT May 23, 2016    TECHNIQUE:  MR imaging of the brain was performed with particular attention to  the pituitary gland including sagittal T1, axial T1, T2, FLAIR, DWI/ADC; Pre and  post contrast dynamic imaging was performed using 7 mL of gadolinium with  particular attention to the sella turcica.    FINDINGS:      Ventricles:  Prominence of the ventricles, sulci and cisterns related to  underlying atrophy.    Intracranial Hemorrhage:  None.    Brain Parenchyma/Brainstem:  Mild chronic white matter disease in the  supratentorial brain. No acute infarction.  Basal Cisterns:  Normal.   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 This displaces normal pituitary tissue and infundibulum toward the  left. This abuts and slightly deforms the optic chiasm again just right of  midline. Mass extends toward the right cavernous sinus, though without definite  invasion  Flow Voids:  Normal.  Post Contrast:  No abnormal parenchymal or meningeal enhancement.  Additional Comments:  N/A.      Impression IMPRESSION:    1. 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  2. Mild chronic white matter disease with no acute abnormality         IR Results (recent):  No results found for this or any previous visit.    VAS/US Results (recent):  Results from Hospital  Encounter encounter on 04/24/16   DUPLEX LOWER EXT VENOUS RIGHT    Narrative HISTORY: Right leg edema and pain.     PROCEDURE: Multiple real time duplex and color Doppler interrogation of the  right lower extremity deep venous system were performed.    FINDINGS: The veins are compressible and demonstrate normal phasicity and  augmentation. No filling defects are identified to suggest deep venous  thrombosis. The veins of the groin and thigh and knee and calf were evaluated.    However, there is noted to be a right suprapatellar joint effusion with debris  measuring 6.1 x 1.6 cm.      Impression IMPRESSION:    No evidence of deep venous thrombosis.    Left suprapatellar joint effusion with debris.         PHYSICAL EXAM:  General:    Alert, cooperative, no distress, appears stated age.     Head:   Normocephalic, without obvious abnormality, atraumatic.  Eyes:   Conjunctivae/corneas clear.  PERRLA  Nose:  Nares normal. No drainage or sinus tenderness.  Throat:    Lips, mucosa, and tongue normal.  No Thrush  Neck:  Supple, symmetrical,  no adenopathy, thyroid: non tender    no carotid bruit and no JVD.  Back:    Symmetric,  No CVA tenderness.  Lungs:   Clear to auscultation bilaterally.  No Wheezing or Rhonchi. No rales.  Chest wall:  No tenderness or deformity. No Accessory muscle use.  Heart:   Regular rate and rhythm,  no murmur, rub or gallop.  Abdomen:   Soft, non-tender. Not distended.  Bowel sounds normal. No masses  Extremities: Extremities normal, atraumatic, No cyanosis.  No edema. No clubbing  Skin:     Texture, turgor normal. No rashes or lesions.  Not Jaundiced  Lymph nodes: Cervical, supraclavicular normal.  Psych:  Good insight.  Not depressed.  Not anxious or agitated.      NEUROLOGICAL EXAM:  Appearance:  The patient is well developed, well nourished, provides a coherent history and is in no acute distress.   Mental Status: Oriented to time, place and person. Mood and affect appropriate.   Cranial  Nerves:   Intact visual fields. Fundi are benign. PERLA, EOM's full, no nystagmus, no ptosis. Facial sensation is normal. Corneal reflexes are intact. Facial movement is symmetric. Hearing is normal bilaterally. Palate is midline with normal sternocleidomastoid and trapezius muscles are normal. Tongue is midline.   Motor:  5/5 strength in upper and lower proximal and distal muscles. Normal bulk and tone. No fasciculations.   Reflexes:   Deep tendon reflexes 1+/4 and symmetrical.   Sensory:    Dysesthesia to touch, pinprick and vibration.   Gait:   Slightly unsteady l gait.   Tremor:   No tremor noted.   Cerebellar:  No cerebellar signs present.   Neurovascular:  Normal heart sounds and regular rhythm, peripheral pulses intact, and no carotid bruits.       Assesment  1. Polyneuropathy    - pregabalin (LYRICA) 150 mg capsule; Take 1 Cap by mouth two (2) times a day. Max Daily Amount: 300 mg.  Dispense: 60 Cap; Refill: 2    2. Gait disorder  Stable    3. Bilateral foot-drop  Stable    4. Meningioma (HCC)  Stable  5. Sensory neuropathy    - pregabalin (LYRICA) 150 mg capsule; Take 1 Cap by mouth two (2) times a day. Max Daily Amount: 300 mg.  Dispense: 60 Cap; Refill: 2    ___________________________________________________  PLAN: Medication and plan discussed with patient and her caregiver      ICD-10-CM ICD-9-CM    1. Polyneuropathy  G62.9 356.9 pregabalin (LYRICA) 150 mg capsule   2. Gait disorder  R26.9 781.2    3. Bilateral foot-drop  M21.371 736.79     M21.372     4. Meningioma (HCC)  D32.9 225.2    5. Sensory neuropathy  G62.9 356.9 pregabalin (LYRICA) 150 mg capsule     Follow-up and Dispositions    ?? Return in about 3 months (around 07/07/2019).       :    ___________________________________________________    Attending Physician: Nelwyn Salisbury, MD

## 2019-04-10 ENCOUNTER — Ambulatory Visit: Admit: 2019-04-10 | Discharge: 2019-04-10 | Payer: MEDICARE | Attending: Surgery | Primary: Internal Medicine

## 2019-04-10 ENCOUNTER — Ambulatory Visit: Attending: Surgery | Primary: Family Medicine

## 2019-04-10 DIAGNOSIS — Z171 Estrogen receptor negative status [ER-]: Secondary | ICD-10-CM

## 2019-04-10 DIAGNOSIS — C50412 Malignant neoplasm of upper-outer quadrant of left female breast: Secondary | ICD-10-CM

## 2019-04-10 NOTE — Progress Notes (Signed)
HISTORY OF PRESENT ILLNESS  Kimberly [name] is a 85 y.o. female who comes in for follow up by Kimberly Gales, MD for  breast cancer  Breast Cancer   Associated symptoms include headaches. Pertinent negatives include no chest pain, no abdominal pain and no shortness of breath.   Breast Problem   Associated symptoms include headaches. Pertinent negatives include no chest pain, no abdominal pain and no shortness of breath.   she went for screening mammogram and was noted to have a density in the UOQ of the left breast.  Subsequently she had an [place] confirming a 2.0 x 1.8 x 1.6 cm in the 0100 position 6 cm from nipple.   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  She had not felt a lump, skin changes, nipple changes or drainage, unexplained weight loss or bone pain.  She had menarche at 36, only pregnancy at 70, menopause at 42 and did not take HRT.  Her MGM, M, D, and sister all died from metastatic breast cancer.   She underwent a left lumpectomy for a T2NxMx IDC 03/10/2018.  She received adjuvant WBRT Kimberly [name]).  She had a bilateral 3D dx mammogram 01/12/2019.    Past Medical History:   Diagnosis Date   ??? Adverse effect of anesthesia     slow to wake   ??? Anginal pain ([place])    ??? Arthritis    ??? Breast cancer ([place]) 03/10/2018     left lumpectomy   ??? Cancer ([place]) 01/2018    breast   ??? Chronic pain     neuropathy   ??? Constipation    ??? GERD (gastroesophageal reflux disease)    ??? Hearing loss    ??? High cholesterol    ??? Hypertension    ??? Incontinence    ??? Joint pain    ??? Memory disorder    ??? Muscle pain    ??? Osteoporosis    ??? Ringing in the ears    ??? Snoring    ??? Visual disturbance      Past Surgical History:   Procedure Laterality Date   ??? HX BREAST LUMPECTOMY Left 03/10/2018    LEFT BREAST LUMPECTOMY WITH ULTRASOUND GUIDANCE performed by Kimberly Pucker, MD at [facility]   ??? HX HYSTERECTOMY     ??? HX ORTHOPAEDIC      epidural pain mtg - pt unsure    ??? HX OTHER SURGICAL Right 10/15/2016    ligament correction right foot   ??? HX OTHER SURGICAL  11/26/2016    Pituitary tumor removal     Family History   Problem Relation Age of Onset   ??? Breast Cancer Mother 65   ??? No Known Problems Father    ??? Breast Cancer Sister 24   ??? Breast Cancer Daughter 42   ??? Dementia Brother    ??? Dementia Sister      Social History     Tobacco Use   ??? Smoking status: Never Smoker   ??? Smokeless tobacco: Never Used   Substance Use Topics   ??? Alcohol use: No   ??? Drug use: No     Current Outpatient Medications   Medication Sig   ??? pregabalin (LYRICA) 150 mg capsule Take 1 Cap by mouth two (2) times a day. Max Daily Amount: 300 mg.   ??? verapamil ER (CALAN-SR) 180 mg CR tablet TAKE 1 TABLET BY MOUTH EVERY NIGHT   ???  cilostazoL (PLETAL) 100 mg tablet TAKE 1 TABLET BY MOUTH TWICE DAILY ON AN EMPTY STOMACH   ??? ezetimibe (ZETIA) 10 mg tablet TAKE 1 TABLET BY MOUTH DAILY   ??? oxybutynin (DITROPAN) 5 mg tablet TAKE 1 TABLET BY MOUTH TWICE DAILY   ??? folic acid (FOLVITE) 1 mg tablet Take 1 Tab by mouth daily.   ??? omeprazole (PRILOSEC) 40 mg capsule TAKE 1 CAPSULE BY MOUTH EVERY DAY   ??? famotidine (PEPCID) 20 mg tablet Take 1 Tab by mouth two (2) times a day.   ??? traMADol (ULTRAM) 50 mg tablet every eight (8) hours as needed. 1/2 tablet   ??? alendronate (FOSAMAX) 70 mg tablet TAKE 1 TABLET BY MOUTH EVERY 7 DAYS   ??? acetaminophen (TYLENOL) 500 mg tablet Take 1,000 mg by mouth every six (6) hours as needed for Pain.   ??? estradiol (VAGIFEM) 10 mcg tab vaginal tablet INSERT 1 T VAGINALLY TWICE PER WEEK   ??? isosorbide dinitrate (ISORDIL) 10 mg tablet TAKE 1 TABLET BY MOUTH TWICE DAILY   ??? lisinopril (PRINIVIL) 10 mg tablet Take 1 Tab by mouth daily.   ??? aspirin 81 mg chewable tablet Take 81 mg by mouth daily.   ??? promethazine (PHENERGAN) 12.5 mg tablet Take 1 Tab by mouth every six (6) hours as needed for Nausea. (Patient not taking: Reported on 04/10/2019)    ??? polyethylene glycol (MIRALAX) 17 gram/dose powder TAKE 1 CAPFUL PO ONCE A DAY     No current facility-administered medications for this visit.      Allergies   Allergen Reactions   ??? Pcn [Penicillins] Hives and Myalgia   ??? Sulfa (Sulfonamide Antibiotics) Unknown (comments)         Review of Systems   Constitutional: Positive for malaise/fatigue. Negative for chills, diaphoresis, fever and weight loss.   HENT: Positive for hearing loss. Negative for sore throat.    Eyes: Negative for blurred vision and discharge.        Vision loss   Respiratory: Negative for cough, shortness of breath and wheezing.    Cardiovascular: Positive for leg swelling. Negative for chest pain, palpitations, orthopnea and claudication.   Gastrointestinal: Positive for nausea. Negative for abdominal pain, constipation, diarrhea, heartburn, melena and vomiting.   Genitourinary: Positive for dysuria. Negative for flank pain, frequency and hematuria.   Musculoskeletal: Positive for joint pain and myalgias. Negative for back pain and neck pain.   Skin: Negative for rash.   Neurological: Positive for headaches. Negative for dizziness, speech change, focal weakness, seizures, loss of consciousness and weakness.   Endo/Heme/Allergies: Does not bruise/bleed easily.   Psychiatric/Behavioral: Negative for depression and memory loss.     Visit Vitals  BP 144/74 (BP 1 Location: Right arm, BP Patient Position: Sitting)   Pulse 70   Temp 98.1 ??F (36.7 ??C) (Oral)   Resp 16   Ht 5\' 1"  (1.549 m)   Wt 57.1 kg (125 lb 14.4 oz)   SpO2 98%   BMI 23.79 kg/m??       Physical Exam  Exam conducted with a chaperone present.   Constitutional:       General: She is not in acute distress.     Appearance: She is well-developed. She is not diaphoretic.   HENT:      Head: Normocephalic and atraumatic.      Mouth/Throat:      Pharynx: No oropharyngeal exudate.   Eyes:      General: No scleral icterus.  Conjunctiva/sclera: Conjunctivae normal.       Pupils: Pupils are equal, round, and reactive to light.   Neck:      Musculoskeletal: Normal range of motion and neck supple.      Thyroid: No thyromegaly.      Vascular: No JVD.      Trachea: No tracheal deviation.   Cardiovascular:      Rate and Rhythm: Normal rate and regular rhythm.      Heart sounds: No murmur. No friction rub. No gallop.    Pulmonary:      Effort: Pulmonary effort is normal. No respiratory distress.      Breath sounds: Normal breath sounds. No wheezing or rales.   Chest:      Breasts: Breasts are symmetrical.         Right: Normal. No inverted nipple, mass, nipple discharge, skin change or tenderness.         Left: Mass (induration at lumpectomy site) and skin change (radiation changes ) present. No inverted nipple, nipple discharge or tenderness.       Abdominal:      General: Bowel sounds are normal. There is no distension.      Palpations: Abdomen is soft. There is no mass.      Tenderness: There is no abdominal tenderness. There is no guarding or rebound.   Musculoskeletal: Normal range of motion.   Lymphadenopathy:      Cervical: No cervical adenopathy.      Upper Body:      Right upper body: No supraclavicular adenopathy.      Left upper body: No supraclavicular adenopathy.   Skin:     General: Skin is warm and dry.      Coloration: Skin is not pale.      Findings: No erythema or rash.   Neurological:      Mental Status: She is alert and oriented to person, place, and time.      Cranial Nerves: No cranial nerve deficit.   Psychiatric:         Behavior: Behavior normal.         Thought Content: Thought content normal.         Judgment: Judgment normal.           ASSESSMENT and PLAN  1.  Left breast cancer early stage, Clinically stage 2A (T2N0M0). Triple negative Dx 01/2018.   NED at 1 year and 3 months  2.  Strong family hx breast cancer    3.  Allergy to PCN with hives and upset stomach.  Ok for Ancef  4.  Essential hypertension.  On rx    Bilateral 3D dx mammogram 01/2020     RTC 6 months      Kimberly Pucker, MD FACS

## 2019-04-10 NOTE — Progress Notes (Signed)
Chief Complaint   Patient presents with   ??? Breast Problem     6 month breast check ,last seen 09/22/2018.S/P 03/10/2018 Left breast lumpectomy with intraoperative ultrasound guidance.     1. Have you been to the ER, urgent care clinic since your last visit?  Hospitalized since your last visit?no    2. Have you seen or consulted any other health care providers outside of the [facility] since your last visit?  Include any pap smears or colon screening. [facility] 2 weeks ago

## 2019-04-10 NOTE — Progress Notes (Signed)
Progress Notes by Luellen Pucker, MD at 04/10/19 1140                Author: Luellen Pucker, MD  Service: --  Author Type: Physician       Filed: 04/10/19 1207  Encounter Date: 04/10/2019  Status: Signed          Editor: Luellen Pucker, MD (Physician)               HISTORY OF PRESENT ILLNESS   Kimberly [name] is a 85 y.o.  female who comes in for follow up by Darlys Gales, MD for  breast cancer   Breast Cancer    Associated symptoms include headaches. Pertinent negatives include no chest pain,  no abdominal pain and no shortness of breath.    Breast Problem    Associated  symptoms include headaches. Pertinent negatives include no chest pain, no abdominal pain and no shortness of breath.    she went for screening mammogram and was noted to have a density in the UOQ of the left breast.  Subsequently she had an [place] confirming a 2.0 x 1.8 x 1.6 cm in the 0100 position 6 cm from nipple.   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  She had not felt a lump, skin changes, nipple changes or drainage, unexplained weight loss or bone pain.  She had menarche at 44, only pregnancy at  83, menopause at 48 and did not take HRT.  Her MGM, M, D, and sister all died from metastatic breast cancer.   She underwent a left lumpectomy for a T2NxMx IDC 03/10/2018.  She received adjuvant WBRT Briscoe Burns).  She had a bilateral 3D dx mammogram 01/12/2019.        Past Medical History:        Diagnosis  Date         ?  Adverse effect of anesthesia            slow to wake         ?  Anginal pain ([place])       ?  Arthritis       ?  Breast cancer ([place])  03/10/2018           left lumpectomy         ?  Cancer ([place])  01/2018          breast         ?  Chronic pain            neuropathy         ?  Constipation       ?  GERD (gastroesophageal reflux disease)       ?  Hearing loss       ?  High cholesterol       ?  Hypertension       ?  Incontinence       ?  Joint  pain       ?  Memory disorder       ?  Muscle pain       ?  Osteoporosis       ?  Ringing in the ears       ?  Snoring           ?  Visual disturbance            Past Surgical History:  Procedure  Laterality  Date          ?  HX BREAST LUMPECTOMY  Left  03/10/2018          LEFT BREAST LUMPECTOMY WITH ULTRASOUND GUIDANCE performed by Luellen Pucker, MD at [facility]          ?  HX HYSTERECTOMY         ?  HX ORTHOPAEDIC              epidural pain mtg - pt unsure          ?  HX OTHER SURGICAL  Right  10/15/2016          ligament correction right foot          ?  HX OTHER SURGICAL    11/26/2016          Pituitary tumor removal          Family History         Problem  Relation  Age of Onset          ?  Breast Cancer  Mother  14     ?  No Known Problems  Father       ?  Breast Cancer  Sister  65     ?  Breast Cancer  Daughter  55     ?  Dementia  Brother            ?  Dementia  Sister            Social History          Tobacco Use         ?  Smoking status:  Never Smoker     ?  Smokeless tobacco:  Never Used       Substance Use Topics         ?  Alcohol use:  No         ?  Drug use:  No          Current Outpatient Medications        Medication  Sig         ?  pregabalin (LYRICA) 150 mg capsule  Take 1 Cap by mouth two (2) times a day. Max Daily Amount: 300 mg.     ?  verapamil ER (CALAN-SR) 180 mg CR tablet  TAKE 1 TABLET BY MOUTH EVERY NIGHT     ?  cilostazoL (PLETAL) 100 mg tablet  TAKE 1 TABLET BY MOUTH TWICE DAILY ON AN EMPTY STOMACH     ?  ezetimibe (ZETIA) 10 mg tablet  TAKE 1 TABLET BY MOUTH DAILY     ?  oxybutynin (DITROPAN) 5 mg tablet  TAKE 1 TABLET BY MOUTH TWICE DAILY     ?  folic acid (FOLVITE) 1 mg tablet  Take 1 Tab by mouth daily.     ?  omeprazole (PRILOSEC) 40 mg capsule  TAKE 1 CAPSULE BY MOUTH EVERY DAY     ?  famotidine (PEPCID) 20 mg tablet  Take 1 Tab by mouth two (2) times a day.     ?  traMADol (ULTRAM) 50 mg tablet  every eight (8) hours as needed. 1/2 tablet     ?  alendronate  (FOSAMAX) 70 mg tablet  TAKE 1 TABLET BY MOUTH EVERY 7 DAYS     ?  acetaminophen (TYLENOL) 500 mg tablet  Take 1,000 mg by mouth every six (6)  hours as needed for Pain.     ?  estradiol (VAGIFEM) 10 mcg tab vaginal tablet  INSERT 1 T VAGINALLY TWICE PER WEEK     ?  isosorbide dinitrate (ISORDIL) 10 mg tablet  TAKE 1 TABLET BY MOUTH TWICE DAILY     ?  lisinopril (PRINIVIL) 10 mg tablet  Take 1 Tab by mouth daily.     ?  aspirin 81 mg chewable tablet  Take 81 mg by mouth daily.     ?  promethazine (PHENERGAN) 12.5 mg tablet  Take 1 Tab by mouth every six (6) hours as needed for Nausea. (Patient not taking: Reported on 04/10/2019)         ?  polyethylene glycol (MIRALAX) 17 gram/dose powder  TAKE 1 CAPFUL PO ONCE A DAY          No current facility-administered medications for this visit.           Allergies        Allergen  Reactions         ?  Pcn [Penicillins]  Hives and Myalgia         ?  Sulfa (Sulfonamide Antibiotics)  Unknown (comments)              Review of Systems    Constitutional: Positive for malaise/fatigue. Negative for chills, diaphoresis, fever and weight loss.    HENT: Positive for hearing loss. Negative for sore throat.     Eyes: Negative for blurred vision and discharge.         Vision loss    Respiratory: Negative for cough, shortness of breath and wheezing.     Cardiovascular: Positive for leg swelling. Negative for chest pain, palpitations, orthopnea and claudication.    Gastrointestinal: Positive for nausea. Negative for abdominal pain, constipation, diarrhea, heartburn, melena and vomiting.    Genitourinary: Positive for dysuria. Negative for flank pain, frequency and hematuria.    Musculoskeletal: Positive for joint pain and myalgias . Negative for back pain and neck pain.    Skin: Negative for rash.    Neurological: Positive for headaches. Negative for dizziness, speech change, focal weakness, seizures, loss of consciousness and weakness.     Endo/Heme/Allergies: Does not bruise/bleed easily.     Psychiatric/Behavioral: Negative for depression and memory loss.       Visit Vitals      BP  144/74 (BP 1 Location: Right arm, BP Patient Position: Sitting)     Pulse  70     Temp  98.1 ??F (36.7 ??C) (Oral)     Resp  16     Ht  5\' 1"  (1.549 m)     Wt  57.1 kg (125 lb 14.4 oz)     SpO2  98%        BMI  23.79 kg/m??           Physical Exam   Exam conducted with a chaperone present.   Constitutional:        General: She is not in acute distress.     Appearance: She is well-developed. She is not diaphoretic.    HENT:       Head: Normocephalic and atraumatic.      Mouth/Throat:      Pharynx: No oropharyngeal exudate.    Eyes:       General: No scleral icterus.     Conjunctiva/sclera: Conjunctivae normal.      Pupils: Pupils are equal, round, and reactive to light.  Neck:       Musculoskeletal: Normal range of motion and neck supple.      Thyroid: No thyromegaly.      Vascular: No JVD.      Trachea: No tracheal deviation.   Cardiovascular:       Rate and Rhythm: Normal rate and regular rhythm.      Heart sounds: No murmur. No friction rub. No gallop.     Pulmonary:       Effort: Pulmonary effort is normal. No respiratory distress.      Breath sounds: Normal breath sounds. No wheezing or rales.   Chest:       Breasts: Breasts are symmetrical.          Right: Normal. No inverted nipple, mass, nipple discharge, skin change or tenderness.         Left: Mass  (induration at lumpectomy site) and skin change  (radiation changes ) present. No inverted nipple, nipple discharge or tenderness.         Abdominal :      General: Bowel sounds are normal. There is no distension.      Palpations: Abdomen is soft. There is no mass.      Tenderness: There is no abdominal tenderness. There is no guarding or rebound.     Musculoskeletal: Normal range of motion.     Lymphadenopathy:       Cervical: No cervical adenopathy.      Upper Body:      Right upper body: No supraclavicular adenopathy.      Left upper body: No supraclavicular adenopathy.    Skin:      General: Skin is warm and dry.      Coloration: Skin is not pale.      Findings: No erythema or rash.    Neurological:       Mental Status: She is alert and oriented to person, place, and time.      Cranial Nerves: No cranial nerve deficit.    Psychiatric:         Behavior: Behavior normal.         Thought Content: Thought content normal.         Judgment: Judgment normal.                ASSESSMENT and PLAN   1.  Left breast cancer early stage, Clinically stage 2A (T2N0M0). Triple negative Dx 01/2018.   NED at 1 year and 3 months   2.  Strong family hx breast cancer      3.  Allergy to PCN with hives and upset stomach.  Ok for Ancef   4.  Essential hypertension.  On rx      Bilateral 3D dx mammogram 01/2020      RTC 6 months         Luellen Pucker, MD FACS

## 2019-04-10 NOTE — Progress Notes (Signed)
Chief Complaint   Patient presents with   . Breast Problem     6 month breast check ,last seen 09/22/2018.S/P 03/10/2018 Left breast lumpectomy with intraoperative ultrasound guidance.     1. Have you been to the ER, urgent care clinic since your last visit?  Hospitalized since your last visit?no    2. Have you seen or consulted any other health care providers outside of the [facility] since your last visit?  Include any pap smears or colon screening. [facility] 2 weeks ago

## 2019-06-15 MED ORDER — VERAPAMIL ER 180 MG TAB
180 mg | ORAL_TABLET | ORAL | 0 refills | Status: DC
Start: 2019-06-15 — End: 2019-09-10

## 2019-06-15 MED ORDER — CILOSTAZOL 100 MG TAB
100 mg | ORAL_TABLET | ORAL | 0 refills | Status: DC
Start: 2019-06-15 — End: 2019-09-10

## 2019-07-02 ENCOUNTER — Inpatient Hospital Stay
Admit: 2019-07-02 | Discharge: 2019-07-02 | Disposition: A | Discharge status: Home / Self Care | Payer: MEDICARE | Attending: Emergency Medicine

## 2019-07-02 DIAGNOSIS — B0239 Other herpes zoster eye disease: Secondary | ICD-10-CM

## 2019-07-02 MED ORDER — VALACYCLOVIR 1 G TAB
1 gram | ORAL_TABLET | Freq: Three times a day (TID) | ORAL | 0 refills | Status: AC
Start: 2019-07-02 — End: 2019-07-09

## 2019-07-02 NOTE — ED Provider Notes (Signed)
EMERGENCY DEPARTMENT HISTORY AND PHYSICAL EXAM      Date: 07/02/2019  [name]    History of Presenting Illness     Chief Complaint   Patient presents with   ??? Itchy Eye     Pt arrived to ED from home with R eye itching and swelling.        History Provided By: Patient and Family Member    HPI: Kimberly [name], 85 y.o. female with significant PMHx as listed below, presents by POV to the ED with cc of itching and pain to the right upper eyelid.  Her symptoms started 3 days ago.  Yesterday evening she applied a friend's antibiotic eye ointment to her eyelid and this morning she awoke with swelling and a rash to the eyelid.  She reports the area is more itchy than painful but that there is some pain.  She has no pain to her eyeball itself.  There is no pain with blinking.  She has no change in her vision.  She does wear glasses.  She denies excessive tearing or phobia.    There are no other complaints, changes, or physical findings at this time.    Social Hx: Tobacco (denies), EtOH (denies), Illicit drug use (denies)      PCP: Darlys Gales, MD    No current facility-administered medications on file prior to encounter.      Current Outpatient Medications on File Prior to Encounter   Medication Sig Dispense Refill   ??? cilostazoL (PLETAL) 100 mg tablet Take 1 tablet twice a day 180 Tab 0   ??? verapamil ER (CALAN-SR) 180 mg CR tablet TAKE 1 TABLET BY MOUTH EVERY NIGHT 90 Tab 0   ??? pregabalin (LYRICA) 150 mg capsule Take 1 Cap by mouth two (2) times a day. Max Daily Amount: 300 mg. 60 Cap 2   ??? ezetimibe (ZETIA) 10 mg tablet TAKE 1 TABLET BY MOUTH DAILY 90 Tab 1   ??? oxybutynin (DITROPAN) 5 mg tablet TAKE 1 TABLET BY MOUTH TWICE DAILY 99991111 Tab 1   ??? folic acid (FOLVITE) 1 mg tablet Take 1 Tab by mouth daily. 90 Tab 3   ??? omeprazole (PRILOSEC) 40 mg capsule TAKE 1 CAPSULE BY MOUTH EVERY DAY 90 Cap 3   ??? famotidine (PEPCID) 20 mg tablet Take 1 Tab by mouth two (2) times a day. 60 Tab 3    ??? promethazine (PHENERGAN) 12.5 mg tablet Take 1 Tab by mouth every six (6) hours as needed for Nausea. (Patient not taking: Reported on 04/10/2019) 30 Tab 0   ??? traMADol (ULTRAM) 50 mg tablet every eight (8) hours as needed. 1/2 tablet     ??? alendronate (FOSAMAX) 70 mg tablet TAKE 1 TABLET BY MOUTH EVERY 7 DAYS 4 Tab 5   ??? acetaminophen (TYLENOL) 500 mg tablet Take 1,000 mg by mouth every six (6) hours as needed for Pain.     ??? estradiol (VAGIFEM) 10 mcg tab vaginal tablet INSERT 1 T VAGINALLY TWICE PER WEEK  3   ??? isosorbide dinitrate (ISORDIL) 10 mg tablet TAKE 1 TABLET BY MOUTH TWICE DAILY 180 Tab 0   ??? lisinopril (PRINIVIL) 10 mg tablet Take 1 Tab by mouth daily. 30 Tab 0   ??? polyethylene glycol (MIRALAX) 17 gram/dose powder TAKE 1 CAPFUL PO ONCE A DAY  11   ??? aspirin 81 mg chewable tablet Take 81 mg by mouth daily.  Past History     Past Medical History:  Past Medical History:   Diagnosis Date   ??? Adverse effect of anesthesia     slow to wake   ??? Anginal pain ([place])    ??? Arthritis    ??? Breast cancer ([place]) 03/10/2018     left lumpectomy   ??? Cancer ([place]) 01/2018    breast   ??? Chronic pain     neuropathy   ??? Constipation    ??? GERD (gastroesophageal reflux disease)    ??? Hearing loss    ??? High cholesterol    ??? Hypertension    ??? Incontinence    ??? Joint pain    ??? Memory disorder    ??? Muscle pain    ??? Osteoporosis    ??? Ringing in the ears    ??? Snoring    ??? Visual disturbance        Past Surgical History:  Past Surgical History:   Procedure Laterality Date   ??? HX BREAST LUMPECTOMY Left 03/10/2018    LEFT BREAST LUMPECTOMY WITH ULTRASOUND GUIDANCE performed by Luellen Pucker, MD at [facility]   ??? HX HYSTERECTOMY     ??? HX ORTHOPAEDIC      epidural pain mtg - pt unsure   ??? HX OTHER SURGICAL Right 10/15/2016    ligament correction right foot   ??? HX OTHER SURGICAL  11/26/2016    Pituitary tumor removal       Family History:  Family History   Problem Relation Age of Onset   ??? Breast Cancer Mother 5    ??? No Known Problems Father    ??? Breast Cancer Sister 20   ??? Breast Cancer Daughter 36   ??? Dementia Brother    ??? Dementia Sister        Social History:  Social History     Tobacco Use   ??? Smoking status: Never Smoker   ??? Smokeless tobacco: Never Used   Substance Use Topics   ??? Alcohol use: No   ??? Drug use: No       Allergies:  Allergies   Allergen Reactions   ??? Pcn [Penicillins] Hives and Myalgia   ??? Sulfa (Sulfonamide Antibiotics) Unknown (comments)         Review of Systems   Review of Systems   Constitutional: Negative for chills, diaphoresis and fever.   HENT: Negative for congestion, ear pain, rhinorrhea and sore throat.    Eyes: Positive for itching. Negative for photophobia, pain, discharge, redness and visual disturbance.   Respiratory: Negative for cough and shortness of breath.    Cardiovascular: Negative for chest pain.   Gastrointestinal: Negative for abdominal pain, constipation, diarrhea, nausea and vomiting.   Genitourinary: Negative for difficulty urinating, dysuria, frequency and hematuria.   Musculoskeletal: Negative for arthralgias and myalgias.   Skin: Positive for rash.   Neurological: Negative for headaches.   All other systems reviewed and are negative.      Physical Exam   Physical Exam  Vitals signs and nursing note reviewed.   Constitutional:       General: She is not in acute distress.     Appearance: She is well-developed. She is not diaphoretic.      Comments: 85 y.o. African-American female    HENT:      Head: Normocephalic and atraumatic.   Eyes:      General:         Right eye: No discharge.  Left eye: No discharge.      Extraocular Movements: Extraocular movements intact.      Right eye: Normal extraocular motion and no nystagmus.      Left eye: Normal extraocular motion and no nystagmus.      Conjunctiva/sclera: Conjunctivae normal.      Right eye: Right conjunctiva is not injected.      Left eye: Left conjunctiva is not injected.       Comments: Right upper lateral eyelid has slight swelling, erythema, and a papulovesicular rash present.   Neck:      Musculoskeletal: Normal range of motion and neck supple.   Cardiovascular:      Rate and Rhythm: Normal rate.      Heart sounds: No murmur.   Pulmonary:      Effort: Pulmonary effort is normal.   Skin:     General: Skin is warm and dry.   Neurological:      Mental Status: She is alert and oriented to person, place, and time.   Psychiatric:         Behavior: Behavior normal.         Diagnostic Study Results     Labs - none    Radiologic Studies - none    Medical Decision Making   I am the first provider for this patient.    I reviewed the vital signs, available nursing notes, past medical history, past surgical history, family history and social history.    Vital Signs-Reviewed the patient's vital signs.  Patient Vitals for the past 12 hrs:   Temp Pulse Resp BP SpO2   07/02/19 1130 98.1 ??F (36.7 ??C) 89 16 (!) 149/68 100 %       Records Reviewed: Nursing Notes and Old Medical Records    Provider Notes (Medical Decision Making):   Presents the ED for eyelid itching and pain.  Differential diagnosis include shingles, allergic reaction, contact dermatitis, blepharitis.  Will treat with Valtrex for presumptive shingles.  She should follow-up with ophthalmology.  Vitals are stable.    ED Course:   Initial assessment performed. The patients presenting problems have been discussed, and they are in agreement with the care plan formulated and outlined with them.  I have encouraged them to ask questions as they arise throughout their visit.    12:13 PM  The patient has been evaluated by Min-Venditti, Verdie Shire, MD and a care plan agreed upon. Will place patient on PO valtrex and have her follow up with ophtho.        Critical Care Time: None    Disposition:  DISCHARGE NOTE:  12:22 PM  The pt is ready for discharge. The pt's signs, symptoms, diagnosis, and  discharge instructions have been discussed and pt has conveyed their understanding. The pt is to follow up as recommended or return to ER should their symptoms worsen. Plan has been discussed and pt is in agreement.     PLAN:  1.   Discharge Medication List as of 07/02/2019 12:22 PM      START taking these medications    Details   valACYclovir (VALTREX) 1 gram tablet Take 1 Tab by mouth three (3) times daily for 7 days., Normal, Disp-21 Tab,R-0         CONTINUE these medications which have NOT CHANGED    Details   cilostazoL (PLETAL) 100 mg tablet Take 1 tablet twice a day, Normal, Disp-180 Tab,R-0      verapamil ER (CALAN-SR) 180 mg CR  tablet TAKE 1 TABLET BY MOUTH EVERY NIGHT, Normal, Disp-90 Tab,R-0      pregabalin (LYRICA) 150 mg capsule Take 1 Cap by mouth two (2) times a day. Max Daily Amount: 300 mg., Normal, Disp-60 Cap,R-2      ezetimibe (ZETIA) 10 mg tablet TAKE 1 TABLET BY MOUTH DAILY, Normal, Disp-90 Tab,R-1      oxybutynin (DITROPAN) 5 mg tablet TAKE 1 TABLET BY MOUTH TWICE DAILY, Normal, 0000000 Tab,R-1      folic acid (FOLVITE) 1 mg tablet Take 1 Tab by mouth daily., Normal, Disp-90 Tab,R-3      omeprazole (PRILOSEC) 40 mg capsule TAKE 1 CAPSULE BY MOUTH EVERY DAY, Normal, Disp-90 Cap, R-3      famotidine (PEPCID) 20 mg tablet Take 1 Tab by mouth two (2) times a day., Normal, Disp-60 Tab, R-3      promethazine (PHENERGAN) 12.5 mg tablet Take 1 Tab by mouth every six (6) hours as needed for Nausea., Normal, Disp-30 Tab, R-0      traMADol (ULTRAM) 50 mg tablet every eight (8) hours as needed. 1/2 tablet, Historical Med      alendronate (FOSAMAX) 70 mg tablet TAKE 1 TABLET BY MOUTH EVERY 7 DAYS, Normal, Disp-4 Tab, R-5      acetaminophen (TYLENOL) 500 mg tablet Take 1,000 mg by mouth every six (6) hours as needed for Pain., Historical Med      estradiol (VAGIFEM) 10 mcg tab vaginal tablet INSERT 1 T VAGINALLY TWICE PER WEEK, Historical Med, R-3       isosorbide dinitrate (ISORDIL) 10 mg tablet TAKE 1 TABLET BY MOUTH TWICE DAILY, Normal, Disp-180 Tab, R-0      lisinopril (PRINIVIL) 10 mg tablet Take 1 Tab by mouth daily., Normal, Disp-30 Tab, R-0      polyethylene glycol (MIRALAX) 17 gram/dose powder TAKE 1 CAPFUL PO ONCE A DAY, Historical Med, R-11      aspirin 81 mg chewable tablet Take 81 mg by mouth daily., Historical Med           2.   Follow-up Information     Follow up With Specialties Details Why Contact Info    Darlys Gales, MD Internal Medicine In 1 week  [address]  [place] [address]  [phone]      [facility]  In 2 days          Return to ED if worse     Diagnosis     Clinical Impression:   1. Shingles of eyelid          Please note that this dictation was completed with Dragon, the computer voice recognition software. Quite often unanticipated grammatical, syntax, homophones, and other interpretive errors are inadvertently transcribed by the computer software. Please disregards these errors. Please excuse any errors that have escaped final proofreading.

## 2019-07-02 NOTE — ED Provider Notes (Signed)
ED Provider Notes by Johnella Moloney, [place] at 07/02/19 1212                Author: Johnella Moloney, [place]  Service: EMERGENCY  Author Type: Physician Assistant       Filed: 07/02/19 1357  Date of Service: 07/02/19 1212  Status: Attested           Editor: Johnella Moloney, [place] (Physician Assistant)  Cosigner: Min-Venditti, Verdie Shire, MD at 07/02/19 1519          Attestation signed by Min-Venditti, Verdie Shire, MD at 07/02/19 1519          3:17 PM   I have personally seen and examined the patient, reviewed the APP's note and agree with findings and plan.  Verdie Shire Min-Venditti, MD       The patient presents with: Itchiness of her right eyelid.  Denies any pain of the eye itself, noticed a small amount of redness there 3 days ago, put some ointment on it, however got worse, she now reports more severe itching.  No vision changes.  No  fevers.  Does report a history of shingles in the past over the left neck.      My exam shows: Anterior chambers grossly clear, no conjunctival injection, no tearing.  There is some slight erythema and some subtle vesicles over the right outer lid.  No significant periorbital swelling.  No fluctuance.        Impression/Plan: Patient presents with right lid redness and vesicles concerning for shingles.  Differential includes allergic dermatitis.  Not consistent with cellulitis.  No complaints or involvement of the globe.  Will be given Valtrex and very close  follow-up with ophthalmology.  Will return for any change in vision, eye pain, fevers, or any new or worrisome symptoms.      I have reviewed and agree with the MLP's findings, including all diagnostic interpretations, and plans as written. I was present during the key portions of separately billed procedures.    Sharlet Salina, MD                                    EMERGENCY DEPARTMENT HISTORY AND PHYSICAL EXAM           Date: 07/02/2019   [name]        History of Presenting Illness          Chief  Complaint       Patient presents with        ?  Itchy Eye             Pt arrived to ED from home with R eye itching and swelling.            History Provided By: Patient and Family Member      HPI: Kimberly [name],  85 y.o. female with significant PMHx as listed below, presents by POV to the ED with cc  of itching and pain to the right upper eyelid.  Her symptoms started 3 days ago.  Yesterday evening she applied a friend's antibiotic eye ointment to her eyelid and this morning she awoke with swelling and a rash to the eyelid.  She reports the area is  more itchy than painful but that there is some pain.  She has no pain to her eyeball itself.  There is no pain with blinking.  She  has no change in her vision.  She does wear glasses.  She denies excessive tearing or phobia.      There are no other complaints, changes, or physical findings at this time.      Social Hx: Tobacco (denies), EtOH (denies), Illicit drug use (denies)        PCP: Darlys Gales, MD        No current facility-administered medications on file prior to encounter.           Current Outpatient Medications on File Prior to Encounter          Medication  Sig  Dispense  Refill           ?  cilostazoL (PLETAL) 100 mg tablet  Take 1 tablet twice a day  180 Tab  0     ?  verapamil ER (CALAN-SR) 180 mg CR tablet  TAKE 1 TABLET BY MOUTH EVERY NIGHT  90 Tab  0     ?  pregabalin (LYRICA) 150 mg capsule  Take 1 Cap by mouth two (2) times a day. Max Daily Amount: 300 mg.  60 Cap  2     ?  ezetimibe (ZETIA) 10 mg tablet  TAKE 1 TABLET BY MOUTH DAILY  90 Tab  1     ?  oxybutynin (DITROPAN) 5 mg tablet  TAKE 1 TABLET BY MOUTH TWICE DAILY  180 Tab  1     ?  folic acid (FOLVITE) 1 mg tablet  Take 1 Tab by mouth daily.  90 Tab  3     ?  omeprazole (PRILOSEC) 40 mg capsule  TAKE 1 CAPSULE BY MOUTH EVERY DAY  90 Cap  3     ?  famotidine (PEPCID) 20 mg tablet  Take 1 Tab by mouth two (2) times a day.  60 Tab  3     ?  promethazine (PHENERGAN) 12.5 mg tablet  Take 1  Tab by mouth every six (6) hours as needed for Nausea. (Patient not taking: Reported on 04/10/2019)  30 Tab  0     ?  traMADol (ULTRAM) 50 mg tablet  every eight (8) hours as needed. 1/2 tablet         ?  alendronate (FOSAMAX) 70 mg tablet  TAKE 1 TABLET BY MOUTH EVERY 7 DAYS  4 Tab  5     ?  acetaminophen (TYLENOL) 500 mg tablet  Take 1,000 mg by mouth every six (6) hours as needed for Pain.         ?  estradiol (VAGIFEM) 10 mcg tab vaginal tablet  INSERT 1 T VAGINALLY TWICE PER WEEK    3     ?  isosorbide dinitrate (ISORDIL) 10 mg tablet  TAKE 1 TABLET BY MOUTH TWICE DAILY  180 Tab  0     ?  lisinopril (PRINIVIL) 10 mg tablet  Take 1 Tab by mouth daily.  30 Tab  0     ?  polyethylene glycol (MIRALAX) 17 gram/dose powder  TAKE 1 CAPFUL PO ONCE A DAY    11           ?  aspirin 81 mg chewable tablet  Take 81 mg by mouth daily.                 Past History        Past Medical History:     Past Medical History:        Diagnosis  Date         ?  Adverse effect of anesthesia            slow to wake         ?  Anginal pain ([place])       ?  Arthritis       ?  Breast cancer ([place])  03/10/2018           left lumpectomy         ?  Cancer ([place])  01/2018          breast         ?  Chronic pain            neuropathy         ?  Constipation       ?  GERD (gastroesophageal reflux disease)       ?  Hearing loss       ?  High cholesterol       ?  Hypertension       ?  Incontinence       ?  Joint pain       ?  Memory disorder       ?  Muscle pain       ?  Osteoporosis       ?  Ringing in the ears       ?  Snoring           ?  Visual disturbance             Past Surgical History:     Past Surgical History:         Procedure  Laterality  Date          ?  HX BREAST LUMPECTOMY  Left  03/10/2018          LEFT BREAST LUMPECTOMY WITH ULTRASOUND GUIDANCE performed by Luellen Pucker, MD at [facility]          ?  HX HYSTERECTOMY         ?  HX ORTHOPAEDIC              epidural pain mtg - pt unsure          ?  HX OTHER SURGICAL  Right   10/15/2016          ligament correction right foot          ?  HX OTHER SURGICAL    11/26/2016          Pituitary tumor removal           Family History:     Family History         Problem  Relation  Age of Onset          ?  Breast Cancer  Mother  46     ?  No Known Problems  Father       ?  Breast Cancer  Sister  66     ?  Breast Cancer  Daughter  57     ?  Dementia  Brother            ?  Dementia  Sister             Social History:     Social History          Tobacco Use         ?  Smoking status:  Never  Smoker     ?  Smokeless tobacco:  Never Used       Substance Use Topics         ?  Alcohol use:  No         ?  Drug use:  No           Allergies:     Allergies        Allergen  Reactions         ?  Pcn [Penicillins]  Hives and Myalgia         ?  Sulfa (Sulfonamide Antibiotics)  Unknown (comments)                Review of Systems     Review of Systems    Constitutional: Negative for chills, diaphoresis and fever.    HENT: Negative for congestion, ear pain, rhinorrhea and sore throat.     Eyes: Positive for itching. Negative for photophobia, pain, discharge, redness and visual disturbance.    Respiratory: Negative for cough and shortness of breath.     Cardiovascular: Negative for chest pain.    Gastrointestinal: Negative for abdominal pain, constipation, diarrhea, nausea and vomiting.    Genitourinary: Negative for difficulty urinating, dysuria, frequency and hematuria.    Musculoskeletal: Negative for arthralgias and myalgias.    Skin: Positive for rash.    Neurological: Negative for headaches.    All other systems reviewed and are negative.           Physical Exam     Physical Exam   Vitals signs and nursing note reviewed.   Constitutional:        General: She is not in acute distress.     Appearance: She is well-developed. She is not diaphoretic.      Comments: 85 y.o. African-American female     HENT :       Head: Normocephalic and atraumatic.   Eyes :       General:         Right eye: No discharge.          Left eye: No discharge.      Extraocular Movements: Extraocular movements intact.      Right eye: Normal extraocular motion and no nystagmus.      Left eye: Normal extraocular motion  and no nystagmus.      Conjunctiva/sclera: Conjunctivae normal.      Right eye: Right conjunctiva is not injected.      Left eye: Left conjunctiva is not injected.      Comments:  Right upper lateral eyelid has slight swelling, erythema, and a papulovesicular rash present.   Neck:       Musculoskeletal: Normal range of motion and neck supple.   Cardiovascular :       Rate and Rhythm: Normal rate.      Heart sounds: No murmur.    Pulmonary:       Effort: Pulmonary effort is normal.   Skin:      General: Skin is warm and dry.   Neurological :       Mental Status: She is alert and oriented to person, place, and time.    Psychiatric:         Behavior: Behavior normal.               Diagnostic Study Results        Labs - none      Radiologic Studies - none  Medical Decision Making     I am the first provider for this patient.      I reviewed the vital signs, available nursing notes, past medical history, past surgical history, family history and social history.      Vital Signs-Reviewed the patient's vital signs.   Patient Vitals for the past 12 hrs:            Temp  Pulse  Resp  BP  SpO2            07/02/19 1130  98.1 ??F (36.7 ??C)  89  16  (!) 149/68  100 %           Records Reviewed: Nursing Notes and Old Medical Records      Provider Notes (Medical Decision Making):    Presents the ED for eyelid itching and pain.  Differential diagnosis include shingles, allergic reaction, contact dermatitis, blepharitis.  Will treat with Valtrex for presumptive shingles.  She should  follow-up with ophthalmology.  Vitals are stable.      ED Course:    Initial assessment performed. The patients presenting problems have been discussed, and they are in agreement with the care plan formulated and outlined with them.  I have encouraged them to ask  questions as they arise throughout their visit.      12:13 PM   The patient has been evaluated by Min-Venditti, Verdie Shire, MD and a care plan agreed upon. Will place patient on PO valtrex and have her follow up with ophtho.           Critical Care Time: None      Disposition:   DISCHARGE NOTE:   12:22 PM   The pt is ready for discharge. The pt's signs, symptoms, diagnosis, and discharge instructions have been discussed and pt has conveyed their understanding. The pt is to follow up  as recommended or return to ER should their symptoms worsen. Plan has been discussed and pt is in agreement.       PLAN:   1.      Discharge Medication List as of 07/02/2019 12:22 PM              START taking these medications          Details        valACYclovir (VALTREX) 1 gram tablet  Take 1 Tab by mouth three (3) times daily for 7 days., Normal, Disp-21 Tab,R-0                     CONTINUE these medications which have NOT CHANGED          Details        cilostazoL (PLETAL) 100 mg tablet  Take 1 tablet twice a day, Normal, Disp-180 Tab,R-0               verapamil ER (CALAN-SR) 180 mg CR tablet  TAKE 1 TABLET BY MOUTH EVERY NIGHT, Normal, Disp-90 Tab,R-0               pregabalin (LYRICA) 150 mg capsule  Take 1 Cap by mouth two (2) times a day. Max Daily Amount: 300 mg., Normal, Disp-60 Cap,R-2               ezetimibe (ZETIA) 10 mg tablet  TAKE 1 TABLET BY MOUTH DAILY, Normal, Disp-90 Tab,R-1               oxybutynin (DITROPAN) 5 mg tablet  TAKE 1 TABLET  BY MOUTH TWICE DAILY, Normal, Disp-180 Tab,R-1               folic acid (FOLVITE) 1 mg tablet  Take 1 Tab by mouth daily., Normal, Disp-90 Tab,R-3               omeprazole (PRILOSEC) 40 mg capsule  TAKE 1 CAPSULE BY MOUTH EVERY DAY, Normal, Disp-90 Cap, R-3               famotidine (PEPCID) 20 mg tablet  Take 1 Tab by mouth two (2) times a day., Normal, Disp-60 Tab, R-3               promethazine (PHENERGAN) 12.5 mg tablet  Take 1 Tab by mouth every six (6) hours as needed for Nausea.,  Normal, Disp-30 Tab, R-0               traMADol (ULTRAM) 50 mg tablet  every eight (8) hours as needed. 1/2 tablet, Historical Med               alendronate (FOSAMAX) 70 mg tablet  TAKE 1 TABLET BY MOUTH EVERY 7 DAYS, Normal, Disp-4 Tab, R-5               acetaminophen (TYLENOL) 500 mg tablet  Take 1,000 mg by mouth every six (6) hours as needed for Pain., Historical Med               estradiol (VAGIFEM) 10 mcg tab vaginal tablet  INSERT 1 T VAGINALLY TWICE PER WEEK, Historical Med, R-3               isosorbide dinitrate (ISORDIL) 10 mg tablet  TAKE 1 TABLET BY MOUTH TWICE DAILY, Normal, Disp-180 Tab, R-0               lisinopril (PRINIVIL) 10 mg tablet  Take 1 Tab by mouth daily., Normal, Disp-30 Tab, R-0               polyethylene glycol (MIRALAX) 17 gram/dose powder  TAKE 1 CAPFUL PO ONCE A DAY, Historical Med, R-11               aspirin 81 mg chewable tablet  Take 81 mg by mouth daily., Historical Med                      2.      Follow-up Information               Follow up With  Specialties  Details  Why  Contact Info              Darlys Gales, MD  Internal Medicine  In 1 week    [address]   [place] [address]   [phone]                 [facility]    In 2 days                 Return to ED if worse         Diagnosis        Clinical Impression:       1.  Shingles of eyelid                 Please note that this dictation was completed with Dragon, the computer voice recognition software. Quite often unanticipated grammatical, syntax, homophones, and other interpretive errors are  inadvertently  transcribed by the computer software. Please disregards these errors. Please excuse any errors that have escaped final proofreading.

## 2019-07-10 ENCOUNTER — Encounter: Attending: Adolescent Medicine | Primary: Internal Medicine

## 2019-07-13 ENCOUNTER — Telehealth: Attending: Neurology | Primary: Family Medicine

## 2019-07-13 DIAGNOSIS — G629 Polyneuropathy, unspecified: Secondary | ICD-10-CM

## 2019-07-14 ENCOUNTER — Telehealth: Admit: 2019-07-13 | Payer: MEDICARE | Attending: Neurology | Primary: Internal Medicine

## 2019-07-14 DIAGNOSIS — G629 Polyneuropathy, unspecified: Secondary | ICD-10-CM

## 2019-07-14 NOTE — Progress Notes (Signed)
Neurology Progress Note  Kimberly [name] was seen by synchronous (real-time) audio-video technology on 07/14/19.     Consent:  She and/or her healthcare decision maker is aware that this patient-initiated Telehealth encounter is a billable service, with coverage as determined by her insurance carrier. She is aware that she may receive a bill and has provided verbal consent to proceed: Yes    I was in the office while conducting this encounter.    Pursuant to the emergency declaration under the [place], 1135 waiver authority and the [employer] Act, this Virtual  Visit was conducted, with patient's consent, to reduce the patient's risk of exposure to COVID-19 and provide continuity of care for an established patient.     Services were provided through a video synchronous discussion virtually to substitute for in-person clinic visit.    NAME:  Kimberly [name]   DOB:   [birth year]   MRN:   [id number]     Date/Time:  07/14/2019  Subjective:   Kimberly [name] is a 85 y.o. female here today for follow up for meningioma, status post resection, headache, leg pain, numbness and tingling sensation  Patient's health aide was by her side at the time of interview  She says she still having  pain, mostly in the legs, pain is described as sharp in nature and tend to wake patient up at night but has not gotten any worse.  She denies any recent fall.    Patient currently should be on Lyrica 150 mg p.o. twice daily.  Headache has improved , according to patient, she rarely has headache.  Medication has been helpful.  Headache is throbbing in nature mostly on one side that is the left side, associated with blurry vision.  She however still experiences occasional blurry vision.   She however, denies loss of consciousness dysphagia or odynophagia.   Review of Systems - General ROS: positive for  - fatigue and sleep disturbance   Psychological ROS: positive for - anxiety and sleep disturbances  Ophthalmic ROS: positive for - blurry vision, decreased vision and photophobia  ENT ROS: positive for - headaches, tinnitus and visual changes  Allergy and Immunology ROS: negative  Hematological and Lymphatic ROS: negative  Endocrine ROS: negative  Respiratory ROS: no cough, shortness of breath, or wheezing  Cardiovascular ROS: no chest pain or dyspnea on exertion  Gastrointestinal ROS: no abdominal pain, change in bowel habits, or black or bloody stools  Genito-Urinary ROS: no dysuria, trouble voiding, or hematuria  Musculoskeletal ROS: Muscle weakness, joint pains  Neurological ROS: Headaches, numbness and tingling sensation,dizziness.  Dermatological ROS: negative        Medications reviewed:  Current Outpatient Medications   Medication Sig Dispense Refill   ??? cilostazoL (PLETAL) 100 mg tablet Take 1 tablet twice a day 180 Tab 0   ??? verapamil ER (CALAN-SR) 180 mg CR tablet TAKE 1 TABLET BY MOUTH EVERY NIGHT 90 Tab 0   ??? pregabalin (LYRICA) 150 mg capsule Take 1 Cap by mouth two (2) times a day. Max Daily Amount: 300 mg. 60 Cap 2   ??? ezetimibe (ZETIA) 10 mg tablet TAKE 1 TABLET BY MOUTH DAILY 90 Tab 1   ??? oxybutynin (DITROPAN) 5 mg tablet TAKE 1 TABLET BY MOUTH TWICE DAILY 99991111 Tab 1   ??? folic acid (FOLVITE) 1 mg tablet Take 1 Tab by mouth daily. 90 Tab 3   ??? omeprazole (PRILOSEC) 40 mg capsule  TAKE 1 CAPSULE BY MOUTH EVERY DAY 90 Cap 3   ??? famotidine (PEPCID) 20 mg tablet Take 1 Tab by mouth two (2) times a day. 60 Tab 3   ??? promethazine (PHENERGAN) 12.5 mg tablet Take 1 Tab by mouth every six (6) hours as needed for Nausea. (Patient not taking: Reported on 04/10/2019) 30 Tab 0   ??? traMADol (ULTRAM) 50 mg tablet every eight (8) hours as needed. 1/2 tablet     ??? alendronate (FOSAMAX) 70 mg tablet TAKE 1 TABLET BY MOUTH EVERY 7 DAYS 4 Tab 5   ??? acetaminophen (TYLENOL) 500 mg tablet Take 1,000 mg by mouth every six (6) hours as needed for Pain.      ??? estradiol (VAGIFEM) 10 mcg tab vaginal tablet INSERT 1 T VAGINALLY TWICE PER WEEK  3   ??? isosorbide dinitrate (ISORDIL) 10 mg tablet TAKE 1 TABLET BY MOUTH TWICE DAILY 180 Tab 0   ??? lisinopril (PRINIVIL) 10 mg tablet Take 1 Tab by mouth daily. 30 Tab 0   ??? polyethylene glycol (MIRALAX) 17 gram/dose powder TAKE 1 CAPFUL PO ONCE A DAY  11   ??? aspirin 81 mg chewable tablet Take 81 mg by mouth daily.          Objective:   Vitals:  There were no vitals filed for this visit.            Lab Data Reviewed:  Lab Results   Component Value Date/Time    WBC 3.7 06/16/2018 04:49 PM    HCT 35.5 06/16/2018 04:49 PM    HGB 11.4 06/16/2018 04:49 PM    PLATELET 268 06/16/2018 04:49 PM       Lab Results   Component Value Date/Time    Sodium 143 06/16/2018 04:49 PM    Potassium 4.6 06/16/2018 04:49 PM    Chloride 106 06/16/2018 04:49 PM    CO2 21 06/16/2018 04:49 PM    Glucose 87 06/16/2018 04:49 PM    BUN 13 06/16/2018 04:49 PM    Creatinine 0.80 06/16/2018 04:49 PM    Calcium 9.3 06/16/2018 04:49 PM       No components found for: TROPQUANT    No results found for: ANA      No results found for: HBA1C, HGBE8, HBA1CPOC, HBA1CEXT     Lab Results   Component Value Date/Time    Vitamin B12 668 09/04/2018 01:07 PM       No results found for: ANA, Phillip Heal, XBANA    Lab Results   Component Value Date/Time    Cholesterol, total 158 06/16/2018 04:49 PM    HDL Cholesterol 57 06/16/2018 04:49 PM    LDL, calculated 87 06/16/2018 04:49 PM    VLDL, calculated 14 06/16/2018 04:49 PM    Triglyceride 72 06/16/2018 04:49 PM         CT Results (recent):  Results from [facility] encounter on 05/15/17   CT HEAD WO CONT    Narrative EXAM:  CT HEAD WO CONT  Clinical history: Intractable headache  INDICATION:   intractable headache    COMPARISON: 11/30/2016.    CONTRAST:  None.    TECHNIQUE: Unenhanced CT of the head was performed using 5 mm images. Brain and  bone windows were generated.  CT dose reduction was achieved through use of a   standardized protocol tailored for this examination and automatic exposure  control for dose modulation.      FINDINGS:  The ventricles and sulci are normal  in size, shape and configuration and  midline. Scattered hypodensities in the cerebral white matter.. There is no  intracranial hemorrhage, extra-axial collection,. Pituitary mass lesion likely  microadenoma.  Sulcal and ventricular prominence.. No acute infarct is  identified. The bone windows demonstrate no abnormalities. The visualized  portions of the paranasal sinuses and mastoid air cells are clear.      Impression IMPRESSION:     No acute intracranial process.    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           MRI Results (recent):  Results from [place] encounter on 06/05/16   MRI BRAIN W WO CONT    Narrative INDICATION:  ADENOMA, HEADACCE     EXAMINATION:  MRI BRAIN, PITUITARY, W/WO CONTRAST    COMPARISON:  CT May 23, 2016    TECHNIQUE:  MR imaging of the brain was performed with particular attention to  the pituitary gland including sagittal T1, axial T1, T2, FLAIR, DWI/ADC; Pre and  post contrast dynamic imaging was performed using 7 mL of gadolinium with  particular attention to the sella turcica.    FINDINGS:      Ventricles:  Prominence of the ventricles, sulci and cisterns related to  underlying atrophy.    Intracranial Hemorrhage:  None.    Brain Parenchyma/Brainstem:  Mild chronic white matter disease in the  supratentorial brain. No acute infarction.  Basal Cisterns:  Normal.   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 This displaces normal pituitary tissue and infundibulum toward the  left. This abuts and slightly deforms the optic chiasm again just right of  midline. Mass extends toward the right cavernous sinus, though without definite  invasion  Flow Voids:  Normal.   Post Contrast:  No abnormal parenchymal or meningeal enhancement.  Additional Comments:  N/A.      Impression IMPRESSION:    1. 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  2. Mild chronic white matter disease with no acute abnormality         IR Results (recent):  No results found for this or any previous visit.    VAS/US Results (recent):  Results from Hospital Encounter encounter on 04/24/16   DUPLEX LOWER EXT VENOUS RIGHT    Narrative HISTORY: Right leg edema and pain.     PROCEDURE: Multiple real time duplex and color Doppler interrogation of the  right lower extremity deep venous system were performed.    FINDINGS: The veins are compressible and demonstrate normal phasicity and  augmentation. No filling defects are identified to suggest deep venous  thrombosis. The veins of the groin and thigh and knee and calf were evaluated.    However, there is noted to be a right suprapatellar joint effusion with debris  measuring 6.1 x 1.6 cm.      Impression IMPRESSION:    No evidence of deep venous thrombosis.    Left suprapatellar joint effusion with debris.         PHYSICAL EXAM:  General:    Alert, cooperative, no distress, appears stated age.     Head:   Normocephalic, without obvious abnormality, atraumatic.  Eyes:   Conjunctivae/corneas clear.   Nose:  Nares normal. .  Throat:    Lips and tongue normal.  No Thrush  Neck:  Supple, symmetrical,  no adenopathy, thyroid.     no JVD.  Back:    Symmetric.  Lungs:   Deferred..  Chest wall:  No tenderness or deformity.Marland Kitchen  Heart:   Deferred.  Abdomen:   Not distended.   Extremities: Extremities normal, atraumatic, No cyanosis.  No edema.    Skin:      No rashes or lesions.    Lymph nodes: Cervical, supraclavicular normal.  Psych:  Good insight.  Not depressed.  Not anxious or agitated.      NEUROLOGICAL EXAM:   Appearance:  The patient is well developed, well nourished, provides a coherent history and is in no acute distress.   Mental Status: Oriented to time, place and person. Mood and affect appropriate.   Cranial Nerves:   Intact visual fields.  EOM's full, no nystagmus, no ptosis.  Facial movement is symmetric. Hearing is normal bilaterally.Tongue is midline.   Motor:   Moves all extremities.  No fasciculations.   Reflexes:   Deferred.   Sensory:   Deferred.   Gait:  Not tested.   Tremor:   No tremor noted.   Cerebellar:  No cerebellar signs present.         Assesment  1. Polyneuropathy  Continue Lyrica    2. Bilateral foot-drop  Ankle brace    3. Meningioma (HCC)  Stable    4. Sensory neuropathy  Stable  5. Gait disorder  Intermittent physical therapy    6. Headache disorder  Improved    ___________________________________________________  PLAN: Medication plan discussed with patient and her health aide.      ICD-10-CM ICD-9-CM    1. Polyneuropathy  G62.9 356.9    2. Bilateral foot-drop  M21.371 736.79     M21.372     3. Meningioma (HCC)  D32.9 225.2    4. Sensory neuropathy  G62.9 356.9    5. Gait disorder  R26.9 781.2    6. Headache disorder  R51.9 784.0      Follow-up and Dispositions    ?? Return in about 6 months (around 01/12/2020).           ___________________________________________________    Attending Physician: Nelwyn Salisbury, MD

## 2019-07-14 NOTE — Progress Notes (Signed)
Neurology Progress Note  Kimberly [name] was seen by synchronous (real-time) audio-video technology on 07/14/19.     Consent:  She and/or her healthcare decision maker is aware that this patient-initiated Telehealth encounter is a billable service, with coverage as determined by her insurance carrier. She is aware that she may receive a bill and has provided verbal consent to proceed: Yes    I was in the office while conducting this encounter.    Pursuant to the emergency declaration under the [place], 1135 waiver authority and the [employer] Act, this Virtual  Visit was conducted, with patient's consent, to reduce the patient's risk of exposure to COVID-19 and provide continuity of care for an established patient.     Services were provided through a video synchronous discussion virtually to substitute for in-person clinic visit.    NAME:  Kimberly [name]   DOB:   20-Oct-1923   MRN:   [id number]     Date/Time:  07/14/2019  Subjective:   Kimberly [name] is a 85 y.o. female here today for follow up for meningioma, status post resection, headache, leg pain, numbness and tingling sensation  Patient's health aide was by her side at the time of interview  She says she still having  pain, mostly in the legs, pain is described as sharp in nature and tend to wake patient up at night but has not gotten any worse.  She denies any recent fall.    Patient currently should be on Lyrica 150 mg p.o. twice daily.  Headache has improved , according to patient, she rarely has headache.  Medication has been helpful.  Headache is throbbing in nature mostly on one side that is the left side, associated with blurry vision.  She however still experiences occasional blurry vision.   She however, denies loss of consciousness dysphagia or odynophagia.   Review of Systems - General ROS: positive for  - fatigue and sleep disturbance  Psychological ROS:  positive for - anxiety and sleep disturbances  Ophthalmic ROS: positive for - blurry vision, decreased vision and photophobia  ENT ROS: positive for - headaches, tinnitus and visual changes  Allergy and Immunology ROS: negative  Hematological and Lymphatic ROS: negative  Endocrine ROS: negative  Respiratory ROS: no cough, shortness of breath, or wheezing  Cardiovascular ROS: no chest pain or dyspnea on exertion  Gastrointestinal ROS: no abdominal pain, change in bowel habits, or black or bloody stools  Genito-Urinary ROS: no dysuria, trouble voiding, or hematuria  Musculoskeletal ROS: Muscle weakness, joint pains  Neurological ROS: Headaches, numbness and tingling sensation,dizziness.  Dermatological ROS: negative        Medications reviewed:  Current Outpatient Medications   Medication Sig Dispense Refill   ??? cilostazoL (PLETAL) 100 mg tablet Take 1 tablet twice a day 180 Tab 0   ??? verapamil ER (CALAN-SR) 180 mg CR tablet TAKE 1 TABLET BY MOUTH EVERY NIGHT 90 Tab 0   ??? pregabalin (LYRICA) 150 mg capsule Take 1 Cap by mouth two (2) times a day. Max Daily Amount: 300 mg. 60 Cap 2   ??? ezetimibe (ZETIA) 10 mg tablet TAKE 1 TABLET BY MOUTH DAILY 90 Tab 1   ??? oxybutynin (DITROPAN) 5 mg tablet TAKE 1 TABLET BY MOUTH TWICE DAILY 99991111 Tab 1   ??? folic acid (FOLVITE) 1 mg tablet Take 1 Tab by mouth daily. 90 Tab 3   ??? omeprazole (PRILOSEC) 40 mg capsule  TAKE 1 CAPSULE BY MOUTH EVERY DAY 90 Cap 3   ??? famotidine (PEPCID) 20 mg tablet Take 1 Tab by mouth two (2) times a day. 60 Tab 3   ??? promethazine (PHENERGAN) 12.5 mg tablet Take 1 Tab by mouth every six (6) hours as needed for Nausea. (Patient not taking: Reported on 04/10/2019) 30 Tab 0   ??? traMADol (ULTRAM) 50 mg tablet every eight (8) hours as needed. 1/2 tablet     ??? alendronate (FOSAMAX) 70 mg tablet TAKE 1 TABLET BY MOUTH EVERY 7 DAYS 4 Tab 5   ??? acetaminophen (TYLENOL) 500 mg tablet Take 1,000 mg by mouth every six (6) hours as needed for Pain.     ??? estradiol (VAGIFEM)  10 mcg tab vaginal tablet INSERT 1 T VAGINALLY TWICE PER WEEK  3   ??? isosorbide dinitrate (ISORDIL) 10 mg tablet TAKE 1 TABLET BY MOUTH TWICE DAILY 180 Tab 0   ??? lisinopril (PRINIVIL) 10 mg tablet Take 1 Tab by mouth daily. 30 Tab 0   ??? polyethylene glycol (MIRALAX) 17 gram/dose powder TAKE 1 CAPFUL PO ONCE A DAY  11   ??? aspirin 81 mg chewable tablet Take 81 mg by mouth daily.          Objective:   Vitals:  There were no vitals filed for this visit.            Lab Data Reviewed:  Lab Results   Component Value Date/Time    WBC 3.7 06/16/2018 04:49 PM    HCT 35.5 06/16/2018 04:49 PM    HGB 11.4 06/16/2018 04:49 PM    PLATELET 268 06/16/2018 04:49 PM       Lab Results   Component Value Date/Time    Sodium 143 06/16/2018 04:49 PM    Potassium 4.6 06/16/2018 04:49 PM    Chloride 106 06/16/2018 04:49 PM    CO2 21 06/16/2018 04:49 PM    Glucose 87 06/16/2018 04:49 PM    BUN 13 06/16/2018 04:49 PM    Creatinine 0.80 06/16/2018 04:49 PM    Calcium 9.3 06/16/2018 04:49 PM       No components found for: TROPQUANT    No results found for: ANA      No results found for: HBA1C, HGBE8, HBA1CPOC, HBA1CEXT     Lab Results   Component Value Date/Time    Vitamin B12 668 09/04/2018 01:07 PM       No results found for: ANA, Phillip Heal, XBANA    Lab Results   Component Value Date/Time    Cholesterol, total 158 06/16/2018 04:49 PM    HDL Cholesterol 57 06/16/2018 04:49 PM    LDL, calculated 87 06/16/2018 04:49 PM    VLDL, calculated 14 06/16/2018 04:49 PM    Triglyceride 72 06/16/2018 04:49 PM         CT Results (recent):  Results from [facility] encounter on 05/15/17   CT HEAD WO CONT    Narrative EXAM:  CT HEAD WO CONT  Clinical history: Intractable headache  INDICATION:   intractable headache    COMPARISON: 11/30/2016.    CONTRAST:  None.    TECHNIQUE: Unenhanced CT of the head was performed using 5 mm images. Brain and  bone windows were generated.  CT dose reduction was achieved through use of a  standardized protocol  tailored for this examination and automatic exposure  control for dose modulation.      FINDINGS:  The ventricles and sulci are normal  in size, shape and configuration and  midline. Scattered hypodensities in the cerebral white matter.. There is no  intracranial hemorrhage, extra-axial collection,. Pituitary mass lesion likely  microadenoma.  Sulcal and ventricular prominence.. No acute infarct is  identified. The bone windows demonstrate no abnormalities. The visualized  portions of the paranasal sinuses and mastoid air cells are clear.      Impression IMPRESSION:     No acute intracranial process.    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           MRI Results (recent):  Results from [facility] encounter on 06/05/16   MRI BRAIN W WO CONT    Narrative INDICATION:  ADENOMA, HEADACCE     EXAMINATION:  MRI BRAIN, PITUITARY, W/WO CONTRAST    COMPARISON:  CT May 23, 2016    TECHNIQUE:  MR imaging of the brain was performed with particular attention to  the pituitary gland including sagittal T1, axial T1, T2, FLAIR, DWI/ADC; Pre and  post contrast dynamic imaging was performed using 7 mL of gadolinium with  particular attention to the sella turcica.    FINDINGS:      Ventricles:  Prominence of the ventricles, sulci and cisterns related to  underlying atrophy.    Intracranial Hemorrhage:  None.    Brain Parenchyma/Brainstem:  Mild chronic white matter disease in the  supratentorial brain. No acute infarction.  Basal Cisterns:  Normal.   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 This displaces normal pituitary tissue and infundibulum toward the  left. This abuts and slightly deforms the optic chiasm again just right of  midline. Mass extends toward the right cavernous sinus, though without definite  invasion  Flow Voids:  Normal.  Post Contrast:  No abnormal  parenchymal or meningeal enhancement.  Additional Comments:  N/A.      Impression IMPRESSION:    1. 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  2. Mild chronic white matter disease with no acute abnormality         IR Results (recent):  No results found for this or any previous visit.    VAS/US Results (recent):  Results from Hospital Encounter encounter on 04/24/16   DUPLEX LOWER EXT VENOUS RIGHT    Narrative HISTORY: Right leg edema and pain.     PROCEDURE: Multiple real time duplex and color Doppler interrogation of the  right lower extremity deep venous system were performed.    FINDINGS: The veins are compressible and demonstrate normal phasicity and  augmentation. No filling defects are identified to suggest deep venous  thrombosis. The veins of the groin and thigh and knee and calf were evaluated.    However, there is noted to be a right suprapatellar joint effusion with debris  measuring 6.1 x 1.6 cm.      Impression IMPRESSION:    No evidence of deep venous thrombosis.    Left suprapatellar joint effusion with debris.         PHYSICAL EXAM:  General:    Alert, cooperative, no distress, appears stated age.     Head:   Normocephalic, without obvious abnormality, atraumatic.  Eyes:   Conjunctivae/corneas clear.   Nose:  Nares normal. .  Throat:    Lips and tongue normal.  No Thrush  Neck:  Supple, symmetrical,  no adenopathy, thyroid.     no JVD.  Back:    Symmetric.  Lungs:   Deferred..  Chest wall:  No tenderness or deformity.Marland Kitchen  Heart:   Deferred.  Abdomen:   Not distended.   Extremities: Extremities normal, atraumatic, No cyanosis.  No edema.    Skin:      No rashes or lesions.    Lymph nodes: Cervical, supraclavicular normal.  Psych:  Good insight.  Not depressed.  Not anxious or agitated.      NEUROLOGICAL EXAM:  Appearance:  The patient is well developed, well nourished, provides a coherent history and is in no  acute distress.   Mental Status: Oriented to time, place and person. Mood and affect appropriate.   Cranial Nerves:   Intact visual fields.  EOM's full, no nystagmus, no ptosis.  Facial movement is symmetric. Hearing is normal bilaterally.Tongue is midline.   Motor:   Moves all extremities.  No fasciculations.   Reflexes:   Deferred.   Sensory:   Deferred.   Gait:  Not tested.   Tremor:   No tremor noted.   Cerebellar:  No cerebellar signs present.         Assesment  1. Polyneuropathy  Continue Lyrica    2. Bilateral foot-drop  Ankle brace    3. Meningioma (HCC)  Stable    4. Sensory neuropathy  Stable  5. Gait disorder  Intermittent physical therapy    6. Headache disorder  Improved    ___________________________________________________  PLAN: Medication plan discussed with patient and her health aide.      ICD-10-CM ICD-9-CM    1. Polyneuropathy  G62.9 356.9    2. Bilateral foot-drop  M21.371 736.79     M21.372     3. Meningioma (HCC)  D32.9 225.2    4. Sensory neuropathy  G62.9 356.9    5. Gait disorder  R26.9 781.2    6. Headache disorder  R51.9 784.0      Follow-up and Dispositions    ?? Return in about 6 months (around 01/12/2020).           ___________________________________________________    Attending Physician: Nelwyn Salisbury, MD

## 2019-08-18 NOTE — Telephone Encounter (Signed)
Need letter to get out of the seven time shares that she has, cant continue to pay them. Her power of attorney Ms. Francesco Sor is asking for this letter. Her number is [phone]. She states she spoke about this with you on a previous virtual visit.

## 2019-08-28 ENCOUNTER — Encounter

## 2019-08-28 MED ORDER — PREGABALIN 100 MG CAP
100 mg | ORAL_CAPSULE | ORAL | 3 refills | Status: DC
Start: 2019-08-28 — End: 2020-08-15

## 2019-09-11 DIAGNOSIS — K5900 Constipation, unspecified: Secondary | ICD-10-CM

## 2019-09-12 ENCOUNTER — Inpatient Hospital Stay: Admit: 2019-09-12 | Payer: MEDICARE | Attending: Emergency Medicine | Primary: Internal Medicine

## 2019-09-12 ENCOUNTER — Inpatient Hospital Stay
Admit: 2019-09-12 | Discharge: 2019-09-12 | Disposition: A | Discharge status: Home / Self Care | Payer: MEDICARE | Attending: Emergency Medicine

## 2019-09-12 MED ORDER — MAGNESIUM CITRATE ORAL SOLN
ORAL | Status: DC
Start: 2019-09-12 — End: 2019-09-12

## 2019-09-12 MED FILL — MAGNESIUM CITRATE ORAL SOLN: ORAL | Qty: 296

## 2019-09-12 NOTE — ED Provider Notes (Signed)
EMERGENCY DEPARTMENT HISTORY AND PHYSICAL EXAM      Date: 09/12/2019  [name]    History of Presenting Illness     Chief Complaint   Patient presents with   ??? Constipation     Pt reports she has been straining all day.  Last BM yesterday.  Pt denies hx of constipation.  denies any nausea or vomiting.        History Provided By: Patient    HPI: Kimberly [name], 86 y.o. female presents to the ED with cc of constipation and rectal pain.    Patient is a 86 year old female with a history of heart disease presents with constipation x1 day.  Her daughter reports she has tried chicken soup as well as a suppository and soapsuds enema.  Patient reports she had a small amount of stool, however it was hard and she feels as though it is stuck in her rectum.  Patient reports her last normal bowel movement was yesterday.  Patient reports no vomiting.  No diarrhea.  No new medications.    Otherwise no fever or other complaints at this time.  Did note a prolapsed hemorrhoid and small bleeding after suppository.    There are no other complaints, changes, or physical findings at this time.    PCP: Hilbert Odor, MD    No current facility-administered medications on file prior to encounter.      Current Outpatient Medications on File Prior to Encounter   Medication Sig Dispense Refill   ??? pregabalin (LYRICA) 100 mg capsule TAKE 1 CAPSULE BY MOUTH TWICE DAILY 60 Cap 3   ??? cilostazoL (PLETAL) 100 mg tablet Take 1 tablet twice a day 180 Tab 0   ??? verapamil ER (CALAN-SR) 180 mg CR tablet TAKE 1 TABLET BY MOUTH EVERY NIGHT 90 Tab 0   ??? pregabalin (LYRICA) 150 mg capsule Take 1 Cap by mouth two (2) times a day. Max Daily Amount: 300 mg. 60 Cap 2   ??? ezetimibe (ZETIA) 10 mg tablet TAKE 1 TABLET BY MOUTH DAILY 90 Tab 1   ??? oxybutynin (DITROPAN) 5 mg tablet TAKE 1 TABLET BY MOUTH TWICE DAILY 99991111 Tab 1   ??? folic acid (FOLVITE) 1 mg tablet Take 1 Tab by mouth daily. 90 Tab 3    ??? omeprazole (PRILOSEC) 40 mg capsule TAKE 1 CAPSULE BY MOUTH EVERY DAY 90 Cap 3   ??? famotidine (PEPCID) 20 mg tablet Take 1 Tab by mouth two (2) times a day. 60 Tab 3   ??? promethazine (PHENERGAN) 12.5 mg tablet Take 1 Tab by mouth every six (6) hours as needed for Nausea. (Patient not taking: Reported on 04/10/2019) 30 Tab 0   ??? traMADol (ULTRAM) 50 mg tablet every eight (8) hours as needed. 1/2 tablet     ??? alendronate (FOSAMAX) 70 mg tablet TAKE 1 TABLET BY MOUTH EVERY 7 DAYS 4 Tab 5   ??? acetaminophen (TYLENOL) 500 mg tablet Take 1,000 mg by mouth every six (6) hours as needed for Pain.     ??? estradiol (VAGIFEM) 10 mcg tab vaginal tablet INSERT 1 T VAGINALLY TWICE PER WEEK  3   ??? isosorbide dinitrate (ISORDIL) 10 mg tablet TAKE 1 TABLET BY MOUTH TWICE DAILY 180 Tab 0   ??? lisinopril (PRINIVIL) 10 mg tablet Take 1 Tab by mouth daily. 30 Tab 0   ??? polyethylene glycol (MIRALAX) 17 gram/dose powder TAKE 1 CAPFUL PO ONCE A DAY  11   ???  aspirin 81 mg chewable tablet Take 81 mg by mouth daily.         Past History     Past Medical History:  Past Medical History:   Diagnosis Date   ??? Adverse effect of anesthesia     slow to wake   ??? Anginal pain ([place])    ??? Arthritis    ??? Breast cancer ([place]) 03/10/2018     left lumpectomy   ??? Cancer ([place]) 01/2018    breast   ??? Chronic pain     neuropathy   ??? Constipation    ??? GERD (gastroesophageal reflux disease)    ??? Hearing loss    ??? High cholesterol    ??? Hypertension    ??? Incontinence    ??? Joint pain    ??? Memory disorder    ??? Muscle pain    ??? Osteoporosis    ??? Ringing in the ears    ??? Snoring    ??? Visual disturbance        Past Surgical History:  Past Surgical History:   Procedure Laterality Date   ??? HX BREAST LUMPECTOMY Left 03/10/2018    LEFT BREAST LUMPECTOMY WITH ULTRASOUND GUIDANCE performed by Luellen Pucker, MD at [facility]   ??? HX HYSTERECTOMY     ??? HX ORTHOPAEDIC      epidural pain mtg - pt unsure   ??? HX OTHER SURGICAL Right 10/15/2016     ligament correction right foot   ??? HX OTHER SURGICAL  11/26/2016    Pituitary tumor removal       Family History:  Family History   Problem Relation Age of Onset   ??? Breast Cancer Mother 78   ??? No Known Problems Father    ??? Breast Cancer Sister 48   ??? Breast Cancer Daughter 29   ??? Dementia Brother    ??? Dementia Sister        Social History:  Social History     Tobacco Use   ??? Smoking status: Never Smoker   ??? Smokeless tobacco: Never Used   Substance Use Topics   ??? Alcohol use: No   ??? Drug use: No       Allergies:  Allergies   Allergen Reactions   ??? Pcn [Penicillins] Hives and Myalgia   ??? Sulfa (Sulfonamide Antibiotics) Unknown (comments)         Review of Systems   Review of Systems   Constitutional: Negative for activity change, chills and fever.   HENT: Negative for facial swelling and voice change.    Eyes: Negative for redness.   Respiratory: Negative for cough, shortness of breath and wheezing.    Cardiovascular: Negative for chest pain and leg swelling.   Gastrointestinal: Positive for anal bleeding and constipation. Negative for abdominal pain, diarrhea, nausea and vomiting.   Genitourinary: Negative for decreased urine volume.   Musculoskeletal: Negative for gait problem.   Skin: Negative for pallor and rash.   Neurological: Negative for tremors and facial asymmetry.   Psychiatric/Behavioral: Negative for agitation.   All other systems reviewed and are negative.      Physical Exam   Physical Exam  Vitals signs and nursing note reviewed.   Constitutional:       Comments: 86 year old female, resting on stretcher, no acute distress   HENT:      Head: Normocephalic and atraumatic.   Neck:      Musculoskeletal: Normal range of motion.   Cardiovascular:  Rate and Rhythm: Normal rate and regular rhythm.      Heart sounds: No murmur. No friction rub. No gallop.    Pulmonary:      Effort: Pulmonary effort is normal.      Breath sounds: Normal breath sounds.   Abdominal:      Palpations: Abdomen is soft.       Tenderness: There is no abdominal tenderness.   Musculoskeletal: Normal range of motion.   Skin:     General: Skin is warm.      Capillary Refill: Capillary refill takes less than 2 seconds.   Neurological:      General: No focal deficit present.      Mental Status: She is alert.   Psychiatric:         Mood and Affect: Mood normal.         Diagnostic Study Results     Labs -   No results found for this or any previous visit (from the past 12 hour(s)).    Radiologic Studies -   CT ABD PELV WO CONT   Final Result   No acute intraperitoneal process is identified.       Incidental and/or nonemergent findings are as described in detail above.            CT Results  (Last 48 hours)               09/12/19 0112  CT ABD PELV WO CONT Final result    Impression:  No acute intraperitoneal process is identified.        Incidental and/or nonemergent findings are as described in detail above.           Narrative:  CLINICAL HISTORY: Constipation    INDICATION: Constipation   COMPARISON: 07/28/2012   CONTRAST:  None.       TECHNIQUE:    Thin axial images were obtained through the abdomen and pelvis. Coronal and   sagittal reformats were generated. Oral contrast was not administered. CT dose   reduction was achieved through use of a standardized protocol tailored for this   examination and automatic exposure control for dose modulation.        The absence of intravenous contrast material reduces the sensitivity for   evaluation of visceral organs and vasculature including presence of small mass   lesions, hemodynamically significant stenoses, dissections, mucosal   abnormalities etc.       FINDINGS:    LOWER THORAX: Cardiomegaly and moderate hiatal hernia.   LIVER/GALLBLADDER: Scattered hepatic hypodensities are consistent with simple   cysts Gallbladder is within normal limits. CBD is not dilated.   SPLEEN/PANCREAS:  within normal limits.   ADRENALS/KIDNEYS: Right and left renal cysts No calculus or hydronephrosis.    STOMACH: Moderate hiatal hernia   SMALL BOWEL/COLON: Fecal stasis is extensive. Colonic diverticulosis.No   dilatation or wall thickening.   APPENDIX: Unremarkable.   PERITONEUM: No ascites or pneumoperitoneum.   RETROPERITONEUM: No lymphadenopathy or aortic aneurysm.   REPRODUCTIVE ORGANS:   URINARY BLADDER: No mass or calculus.   BONES: Extensive multilevel spondylolisthesis. Severe canal stenosis at L4-5.   Chronic right sacral deformity. ADDITIONAL COMMENTS: N/A               CXR Results  (Last 48 hours)    None          Medical Decision Making   I am the first provider for this patient.    I reviewed the vital signs,  available nursing notes, past medical history, past surgical history, family history and social history.    Vital Signs-Reviewed the patient's vital signs.  Patient Vitals for the past 12 hrs:   Temp Pulse Resp BP SpO2   09/11/19 2354 98.8 ??F (37.1 ??C) (!) 107 16 (!) 181/89 97 %       Records Reviewed: Nursing Notes    Provider Notes (Medical Decision Making):     86 year old female with a past medical history as above presents with constipation x1 day.  Patient has CT scan in triage, this shows no small bowel obstruction or other acute pathology.  Shows significant fecal loading.  Consider fecal impaction.  Given patient's well appearance and history, likely constipation do not believe labs are necessary at this time. Mildly tachycardic at triage, clinically looks well, doubt hemorrhage, likely hemorrhoidal bleeding reported.    ED Course:   Initial assessment performed. The patients presenting problems have been discussed, and they are in agreement with the care plan formulated and outlined with them.  I have encouraged them to ask questions as they arise throughout their visit.    ED Course as of Sep 12 839   Sat Sep 12, 2019   0344 Rectal exam chaperoned by ED tech shows hard stool, no obvious impaction. External non-bleeding hemorrhoids. Discussed magnesium citrate and discharge.    [MB]       ED Course User Index  [MB] Azalia Bilis, MD     I was able to manually remove a small amount of hard stool, patient and niece left prior to receiving magnesium citrate or discharge instructions.    PROCEDURES    Procedure Note - Manual Disimpaction:   8:40 AM  Performed by: myself  Patient was manually disimpacted.  A small amount of hard stool was successfully removed.   The procedure took 1-15 minutes, and pt tolerated moderately.        Azalia Bilis, MD      Disposition:    Discharged    DISCHARGE PLAN:  1.   Discharge Medication List as of 09/12/2019  4:07 AM        2.   Follow-up Information     Follow up With Specialties Details Why Contact Info    Latham-Solomon, Clarita Leber, MD Internal Medicine In 1 week  [place] [address]  [phone]          3.  Return to ED if worse     Diagnosis     Clinical Impression:   1. Constipation, unspecified constipation type        Attestations:    Azalia Bilis, MD    Please note that this dictation was completed with Dragon, the computer voice recognition software.  Quite often unanticipated grammatical, syntax, homophones, and other interpretive errors are inadvertently transcribed by the computer software.  Please disregard these errors.  Please excuse any errors that have escaped final proofreading.  Thank you.

## 2019-09-12 NOTE — ED Provider Notes (Signed)
ED Provider Notes by Azalia Bilis, MD at 09/12/19 [id number]                Author: Azalia Bilis, MD  Service: Emergency Medicine  Author Type: Physician       Filed: 09/12/19 0841  Date of Service: 09/12/19 0304  Status: Signed          Editor: Azalia Bilis, MD (Physician)               EMERGENCY DEPARTMENT HISTORY AND PHYSICAL EXAM           Date: 09/12/2019   [name]        History of Presenting Illness          Chief Complaint       Patient presents with        ?  Constipation             Pt reports she has been straining all day.  Last BM yesterday.  Pt denies hx of constipation.  denies any nausea or vomiting.            History Provided By: Patient      HPI: Kimberly [name],  86 y.o. female presents to the ED with cc of constipation and rectal pain.      Patient is a 86 year old female with a history of heart disease presents with constipation x1 day.  Her daughter reports she has tried chicken soup as well as a suppository and soapsuds enema.  Patient reports she had a small amount of stool, however  it was hard and she feels as though it is stuck in her rectum.  Patient reports her last normal bowel movement was yesterday.  Patient reports no vomiting.  No diarrhea.  No new medications.      Otherwise no fever or other complaints at this time.  Did note a prolapsed hemorrhoid and small bleeding after suppository.      There are no other complaints, changes, or physical findings at this time.      PCP: Hilbert Odor, MD        No current facility-administered medications on file prior to encounter.           Current Outpatient Medications on File Prior to Encounter          Medication  Sig  Dispense  Refill           ?  pregabalin (LYRICA) 100 mg capsule  TAKE 1 CAPSULE BY MOUTH TWICE DAILY  60 Cap  3     ?  cilostazoL (PLETAL) 100 mg tablet  Take 1 tablet twice a day  180 Tab  0     ?  verapamil ER (CALAN-SR) 180 mg CR tablet  TAKE 1 TABLET BY MOUTH EVERY NIGHT   90 Tab  0     ?  pregabalin (LYRICA) 150 mg capsule  Take 1 Cap by mouth two (2) times a day. Max Daily Amount: 300 mg.  60 Cap  2     ?  ezetimibe (ZETIA) 10 mg tablet  TAKE 1 TABLET BY MOUTH DAILY  90 Tab  1     ?  oxybutynin (DITROPAN) 5 mg tablet  TAKE 1 TABLET BY MOUTH TWICE DAILY  180 Tab  1     ?  folic acid (FOLVITE) 1 mg tablet  Take 1 Tab by mouth daily.  90 Tab  3     ?  omeprazole (PRILOSEC) 40 mg capsule  TAKE 1 CAPSULE BY MOUTH EVERY DAY  90 Cap  3     ?  famotidine (PEPCID) 20 mg tablet  Take 1 Tab by mouth two (2) times a day.  60 Tab  3     ?  promethazine (PHENERGAN) 12.5 mg tablet  Take 1 Tab by mouth every six (6) hours as needed for Nausea. (Patient not taking: Reported on 04/10/2019)  30 Tab  0     ?  traMADol (ULTRAM) 50 mg tablet  every eight (8) hours as needed. 1/2 tablet         ?  alendronate (FOSAMAX) 70 mg tablet  TAKE 1 TABLET BY MOUTH EVERY 7 DAYS  4 Tab  5     ?  acetaminophen (TYLENOL) 500 mg tablet  Take 1,000 mg by mouth every six (6) hours as needed for Pain.         ?  estradiol (VAGIFEM) 10 mcg tab vaginal tablet  INSERT 1 T VAGINALLY TWICE PER WEEK    3     ?  isosorbide dinitrate (ISORDIL) 10 mg tablet  TAKE 1 TABLET BY MOUTH TWICE DAILY  180 Tab  0     ?  lisinopril (PRINIVIL) 10 mg tablet  Take 1 Tab by mouth daily.  30 Tab  0     ?  polyethylene glycol (MIRALAX) 17 gram/dose powder  TAKE 1 CAPFUL PO ONCE A DAY    11           ?  aspirin 81 mg chewable tablet  Take 81 mg by mouth daily.                 Past History        Past Medical History:     Past Medical History:        Diagnosis  Date         ?  Adverse effect of anesthesia            slow to wake         ?  Anginal pain ([place])       ?  Arthritis       ?  Breast cancer ([place])  03/10/2018           left lumpectomy         ?  Cancer ([place])  01/2018          breast         ?  Chronic pain            neuropathy         ?  Constipation       ?  GERD (gastroesophageal reflux disease)       ?  Hearing loss       ?  High  cholesterol       ?  Hypertension       ?  Incontinence       ?  Joint pain       ?  Memory disorder       ?  Muscle pain       ?  Osteoporosis       ?  Ringing in the ears       ?  Snoring           ?  Visual disturbance             Past Surgical History:  Past Surgical History:         Procedure  Laterality  Date          ?  HX BREAST LUMPECTOMY  Left  03/10/2018          LEFT BREAST LUMPECTOMY WITH ULTRASOUND GUIDANCE performed by Luellen Pucker, MD at [facility]          ?  HX HYSTERECTOMY         ?  HX ORTHOPAEDIC              epidural pain mtg - pt unsure          ?  HX OTHER SURGICAL  Right  10/15/2016          ligament correction right foot          ?  HX OTHER SURGICAL    11/26/2016          Pituitary tumor removal           Family History:     Family History         Problem  Relation  Age of Onset          ?  Breast Cancer  Mother  52     ?  No Known Problems  Father       ?  Breast Cancer  Sister  59     ?  Breast Cancer  Daughter  32     ?  Dementia  Brother            ?  Dementia  Sister             Social History:     Social History          Tobacco Use         ?  Smoking status:  Never Smoker     ?  Smokeless tobacco:  Never Used       Substance Use Topics         ?  Alcohol use:  No         ?  Drug use:  No           Allergies:     Allergies        Allergen  Reactions         ?  Pcn [Penicillins]  Hives and Myalgia         ?  Sulfa (Sulfonamide Antibiotics)  Unknown (comments)                Review of Systems     Review of Systems    Constitutional: Negative for activity change, chills and fever.    HENT: Negative for facial swelling and voice change.     Eyes: Negative for redness.    Respiratory: Negative for cough, shortness of breath and wheezing.     Cardiovascular: Negative for chest pain and leg swelling.    Gastrointestinal: Positive for anal bleeding and constipation . Negative for abdominal pain, diarrhea, nausea and vomiting.    Genitourinary: Negative for decreased urine  volume.    Musculoskeletal: Negative for gait problem.    Skin: Negative for pallor and rash.    Neurological: Negative for tremors and facial asymmetry.    Psychiatric/Behavioral: Negative for agitation.    All other systems reviewed and are negative.           Physical Exam     Physical Exam  Vitals signs and nursing note reviewed.   Constitutional:        Comments: 86 year old female, resting on stretcher, no acute distress   HENT:       Head: Normocephalic and atraumatic.   Neck:       Musculoskeletal: Normal range of motion.   Cardiovascular :       Rate and Rhythm: Normal rate and regular rhythm.      Heart sounds: No murmur. No friction rub. No gallop.     Pulmonary:       Effort: Pulmonary effort is normal.      Breath sounds: Normal breath sounds.   Abdominal :      Palpations: Abdomen is soft.      Tenderness: There is no abdominal tenderness.     Musculoskeletal: Normal range of motion.    Skin:      General: Skin is warm.      Capillary Refill: Capillary refill takes less than 2 seconds.    Neurological:       General: No focal deficit present.      Mental Status: She is alert.    Psychiatric:         Mood and Affect: Mood normal.               Diagnostic Study Results        Labs -    No results found for this or any previous visit (from the past 12 hour(s)).      Radiologic Studies -      CT ABD PELV WO CONT       Final Result     No acute intraperitoneal process is identified.           Incidental and/or nonemergent findings are as described in detail above.                       CT Results   (Last 48 hours)                                    09/12/19 0112    CT ABD PELV WO CONT  Final result            Impression:    No acute intraperitoneal process is identified.              Incidental and/or nonemergent findings are as described in detail above.                              Narrative:    CLINICAL HISTORY: Constipation       INDICATION: Constipation      COMPARISON: 07/28/2012      CONTRAST:   None.             TECHNIQUE:       Thin axial images were obtained through the abdomen and pelvis. Coronal and      sagittal reformats were generated. Oral contrast was not administered. CT dose      reduction was achieved through use of a standardized protocol tailored for this      examination and automatic exposure control for dose modulation.              The absence of intravenous contrast material reduces the sensitivity for      evaluation of visceral  organs and vasculature including presence of small mass      lesions, hemodynamically significant stenoses, dissections, mucosal      abnormalities etc.             FINDINGS:       LOWER THORAX: Cardiomegaly and moderate hiatal hernia.      LIVER/GALLBLADDER: Scattered hepatic hypodensities are consistent with simple      cysts Gallbladder is within normal limits. CBD is not dilated.      SPLEEN/PANCREAS:  within normal limits.      ADRENALS/KIDNEYS: Right and left renal cysts No calculus or hydronephrosis.      STOMACH: Moderate hiatal hernia      SMALL BOWEL/COLON: Fecal stasis is extensive. Colonic diverticulosis.No      dilatation or wall thickening.      APPENDIX: Unremarkable.      PERITONEUM: No ascites or pneumoperitoneum.      RETROPERITONEUM: No lymphadenopathy or aortic aneurysm.      REPRODUCTIVE ORGANS:      URINARY BLADDER: No mass or calculus.      BONES: Extensive multilevel spondylolisthesis. Severe canal stenosis at L4-5.      Chronic right sacral deformity. ADDITIONAL COMMENTS: N/A                                 CXR Results   (Last 48 hours)          None                    Medical Decision Making     I am the first provider for this patient.      I reviewed the vital signs, available nursing notes, past medical history, past surgical history, family history and social history.      Vital Signs-Reviewed the patient's vital signs.   Patient Vitals for the past 12 hrs:            Temp  Pulse  Resp  BP  SpO2            09/11/19 2354  98.8 ??F  (37.1 ??C)  (!) 107  16  (!) 181/89  97 %           Records Reviewed: Nursing Notes      Provider Notes (Medical Decision Making):       86 year old female with a past medical history as above presents with constipation x1 day.  Patient has CT scan in triage, this shows no small bowel obstruction or other acute pathology.  Shows significant fecal loading.  Consider fecal impaction.  Given  patient's well appearance and history, likely constipation do not believe labs are necessary at this time. Mildly tachycardic at triage, clinically looks well, doubt hemorrhage, likely hemorrhoidal bleeding reported.      ED Course:    Initial assessment performed. The patients presenting problems have been discussed, and they are in agreement with the care plan formulated and outlined with them.  I have encouraged them to ask questions as they arise throughout their visit.        ED Course as of Sep 12 839       Sat Sep 12, 2019        0344  Rectal exam chaperoned by ED tech shows hard stool, no obvious impaction. External non-bleeding hemorrhoids. Discussed magnesium citrate and discharge.     [MB]  ED Course User Index   [MB] Azalia Bilis, MD        I was able to manually remove a small amount of hard stool, patient and niece left prior to receiving magnesium citrate or discharge instructions.      PROCEDURES      Procedure Note - Manual Disimpaction:    8:40 AM   Performed by: myself   Patient was manually disimpacted.  A small amount of hard stool was successfully removed.    The procedure took 1-15 minutes, and pt tolerated moderately.            Azalia Bilis, MD         Disposition:      Discharged      DISCHARGE PLAN:   1.      Discharge Medication List as of 09/12/2019  4:07 AM               2.      Follow-up Information               Follow up With  Specialties  Details  Why  Contact Info              Latham-Solomon, Clarita Leber, MD  Internal Medicine  In 1 week    [place]  [address]   [phone]                3.  Return to ED if worse         Diagnosis        Clinical Impression:       1.  Constipation, unspecified constipation type            Attestations:      Azalia Bilis, MD      Please note that this dictation was completed with Dragon, the computer voice recognition software.  Quite often unanticipated grammatical, syntax, homophones, and other interpretive errors are  inadvertently transcribed by the computer software.  Please disregard these errors.  Please excuse any errors that have escaped final proofreading.  Thank you.

## 2019-09-13 NOTE — Telephone Encounter (Signed)
Please ask her to schedule follow up visit

## 2019-09-14 MED ORDER — OXYBUTYNIN CHLORIDE 5 MG TAB
5 mg | ORAL_TABLET | ORAL | 1 refills | Status: DC
Start: 2019-09-14 — End: 2020-03-10

## 2019-09-14 MED ORDER — CILOSTAZOL 100 MG TAB
100 mg | ORAL_TABLET | ORAL | 0 refills | Status: DC
Start: 2019-09-14 — End: 2019-12-09

## 2019-09-14 MED ORDER — VERAPAMIL ER 180 MG TAB
180 mg | ORAL_TABLET | ORAL | 0 refills | Status: DC
Start: 2019-09-14 — End: 2019-12-09

## 2019-09-14 NOTE — Progress Notes (Signed)
ED 09/12/19: Constipation     Patient on Complex CM Enrollment Report/fu Contact. Left message on voice mail with my contact information for return call.  Need to assess for ongoing needs and CCM enrollment/fu.

## 2019-09-23 ENCOUNTER — Encounter: Attending: Internal Medicine | Primary: Internal Medicine

## 2019-09-23 NOTE — Telephone Encounter (Signed)
Patient caregiver calling again asking for a letter stating patient diagnosis and a reason she need to get out of paying for the 7 time shares that she needs to get out of . Please let me know when letter ready. Her number is [phone]

## 2019-10-09 ENCOUNTER — Ambulatory Visit: Admit: 2019-10-09 | Discharge: 2019-10-09 | Payer: MEDICARE | Attending: Surgery | Primary: Internal Medicine

## 2019-10-09 ENCOUNTER — Ambulatory Visit: Attending: Surgery | Primary: Family Medicine

## 2019-10-09 DIAGNOSIS — Z171 Estrogen receptor negative status [ER-]: Secondary | ICD-10-CM

## 2019-10-09 DIAGNOSIS — C50412 Malignant neoplasm of upper-outer quadrant of left female breast: Secondary | ICD-10-CM

## 2019-10-09 NOTE — Progress Notes (Signed)
HISTORY OF PRESENT ILLNESS  Kimberly [name] is a 86 y.o. female who comes in for follow up by Hilbert Odor, MD for  breast cancer  Breast Problem  Associated symptoms include headaches. Pertinent negatives include no chest pain, no abdominal pain and no shortness of breath.   Breast Cancer  Associated symptoms include headaches. Pertinent negatives include no chest pain, no abdominal pain and no shortness of breath.   Follow-up  Associated symptoms include headaches. Pertinent negatives include no chest pain, no abdominal pain and no shortness of breath.   she went for screening mammogram and was noted to have a density in the UOQ of the left breast.  Subsequently she had an [place] confirming a 2.0 x 1.8 x 1.6 cm in the 0100 position 6 cm from nipple.   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  She had not felt a lump, skin changes, nipple changes or drainage, unexplained weight loss or bone pain.  She had menarche at 75, only pregnancy at 56, menopause at 46 and did not take HRT.  Her MGM, M, D, and sister all died from metastatic breast cancer.   She underwent a left lumpectomy for a T2NxMx IDC 03/10/2018.  She received adjuvant WBRT Briscoe Burns).  She had a bilateral 3D dx mammogram 01/12/2019.    Past Medical History:   Diagnosis Date   ??? Adverse effect of anesthesia     slow to wake   ??? Anginal pain ([place])    ??? Arthritis    ??? Breast cancer ([place]) 03/10/2018     left lumpectomy   ??? Cancer ([place]) 01/2018    breast   ??? Chronic pain     neuropathy   ??? Constipation    ??? GERD (gastroesophageal reflux disease)    ??? Hearing loss    ??? High cholesterol    ??? Hypertension    ??? Incontinence    ??? Joint pain    ??? Memory disorder    ??? Muscle pain    ??? Osteoporosis    ??? Ringing in the ears    ??? Snoring    ??? Visual disturbance      Past Surgical History:   Procedure Laterality Date   ??? HX BREAST LUMPECTOMY Left 03/10/2018     LEFT BREAST LUMPECTOMY WITH ULTRASOUND GUIDANCE performed by Luellen Pucker, MD at [facility]   ??? HX HYSTERECTOMY     ??? HX ORTHOPAEDIC      epidural pain mtg - pt unsure   ??? HX OTHER SURGICAL Right 10/15/2016    ligament correction right foot   ??? HX OTHER SURGICAL  11/26/2016    Pituitary tumor removal     Family History   Problem Relation Age of Onset   ??? Breast Cancer Mother 87   ??? No Known Problems Father    ??? Breast Cancer Sister 12   ??? Breast Cancer Daughter 33   ??? Dementia Brother    ??? Dementia Sister      Social History     Tobacco Use   ??? Smoking status: Never Smoker   ??? Smokeless tobacco: Never Used   Substance Use Topics   ??? Alcohol use: No   ??? Drug use: No     Current Outpatient Medications   Medication Sig   ??? cilostazoL (PLETAL) 100 mg tablet Take 1 tablet twice a day   ??? verapamil ER (CALAN-SR) 180 mg CR  tablet TAKE 1 TABLET BY MOUTH EVERY NIGHT   ??? oxybutynin (DITROPAN) 5 mg tablet Take 1 tablet twice a day   ??? pregabalin (LYRICA) 100 mg capsule TAKE 1 CAPSULE BY MOUTH TWICE DAILY   ??? pregabalin (LYRICA) 150 mg capsule Take 1 Cap by mouth two (2) times a day. Max Daily Amount: 300 mg.   ??? ezetimibe (ZETIA) 10 mg tablet TAKE 1 TABLET BY MOUTH DAILY   ??? folic acid (FOLVITE) 1 mg tablet Take 1 Tab by mouth daily.   ??? omeprazole (PRILOSEC) 40 mg capsule TAKE 1 CAPSULE BY MOUTH EVERY DAY   ??? famotidine (PEPCID) 20 mg tablet Take 1 Tab by mouth two (2) times a day.   ??? promethazine (PHENERGAN) 12.5 mg tablet Take 1 Tab by mouth every six (6) hours as needed for Nausea.   ??? traMADol (ULTRAM) 50 mg tablet every eight (8) hours as needed. 1/2 tablet   ??? alendronate (FOSAMAX) 70 mg tablet TAKE 1 TABLET BY MOUTH EVERY 7 DAYS   ??? acetaminophen (TYLENOL) 500 mg tablet Take 1,000 mg by mouth every six (6) hours as needed for Pain.   ??? estradiol (VAGIFEM) 10 mcg tab vaginal tablet INSERT 1 T VAGINALLY TWICE PER WEEK   ??? isosorbide dinitrate (ISORDIL) 10 mg tablet TAKE 1 TABLET BY MOUTH TWICE DAILY    ??? lisinopril (PRINIVIL) 10 mg tablet Take 1 Tab by mouth daily.   ??? polyethylene glycol (MIRALAX) 17 gram/dose powder TAKE 1 CAPFUL PO ONCE A DAY   ??? aspirin 81 mg chewable tablet Take 81 mg by mouth daily.     No current facility-administered medications for this visit.      Allergies   Allergen Reactions   ??? Pcn [Penicillins] Hives and Myalgia   ??? Sulfa (Sulfonamide Antibiotics) Unknown (comments)         Review of Systems   Constitutional: Positive for malaise/fatigue. Negative for chills, diaphoresis, fever and weight loss.   HENT: Positive for hearing loss. Negative for sore throat.    Eyes: Negative for blurred vision and discharge.        Vision loss   Respiratory: Negative for cough, shortness of breath and wheezing.    Cardiovascular: Positive for leg swelling. Negative for chest pain, palpitations, orthopnea and claudication.   Gastrointestinal: Positive for nausea. Negative for abdominal pain, constipation, diarrhea, heartburn, melena and vomiting.   Genitourinary: Positive for dysuria. Negative for flank pain, frequency and hematuria.   Musculoskeletal: Positive for joint pain and myalgias. Negative for back pain and neck pain.   Skin: Negative for rash.   Neurological: Positive for headaches. Negative for dizziness, speech change, focal weakness, seizures, loss of consciousness and weakness.   Endo/Heme/Allergies: Does not bruise/bleed easily.   Psychiatric/Behavioral: Negative for depression and memory loss.     Visit Vitals  BP (!) 128/57 (BP 1 Location: Left upper arm, BP Patient Position: Sitting, BP Cuff Size: Adult)   Pulse 82   Temp (!) 96.6 ??F (35.9 ??C) (Temporal)   Resp 14   Ht 5\' 2"  (1.575 m)   SpO2 92%   BMI 22.58 kg/m??       Physical Exam  Exam conducted with a chaperone present.   Constitutional:       General: She is not in acute distress.     Appearance: She is well-developed. She is not diaphoretic.   HENT:      Head: Normocephalic and atraumatic.      Mouth/Throat:  Pharynx: No oropharyngeal exudate.   Eyes:      General: No scleral icterus.     Conjunctiva/sclera: Conjunctivae normal.      Pupils: Pupils are equal, round, and reactive to light.   Neck:      Musculoskeletal: Normal range of motion and neck supple.      Thyroid: No thyromegaly.      Vascular: No JVD.      Trachea: No tracheal deviation.   Cardiovascular:      Rate and Rhythm: Normal rate and regular rhythm.      Heart sounds: No murmur. No friction rub. No gallop.    Pulmonary:      Effort: Pulmonary effort is normal. No respiratory distress.      Breath sounds: Normal breath sounds. No wheezing or rales.   Chest:      Breasts: Breasts are symmetrical.         Right: Normal. No inverted nipple, mass, nipple discharge, skin change or tenderness.         Left: Mass (induration at lumpectomy site) and skin change (radiation changes ) present. No inverted nipple, nipple discharge or tenderness.       Abdominal:      General: Bowel sounds are normal. There is no distension.      Palpations: Abdomen is soft. There is no mass.      Tenderness: There is no abdominal tenderness. There is no guarding or rebound.   Musculoskeletal: Normal range of motion.   Lymphadenopathy:      Cervical: No cervical adenopathy.      Upper Body:      Right upper body: No supraclavicular adenopathy.      Left upper body: No supraclavicular adenopathy.   Skin:     General: Skin is warm and dry.      Coloration: Skin is not pale.      Findings: No erythema or rash.   Neurological:      Mental Status: She is alert and oriented to person, place, and time.      Cranial Nerves: No cranial nerve deficit.   Psychiatric:         Behavior: Behavior normal.         Thought Content: Thought content normal.         Judgment: Judgment normal.           ASSESSMENT and PLAN  1.  Left breast cancer early stage, Clinically stage 2A (T2N0M0). Triple negative Dx 01/2018.   NED at 1 year and 9 months  2.  Strong family hx breast cancer     3.  Allergy to PCN with hives and upset stomach.  Ok for Ancef  4.  Essential hypertension.  On rx  5.  COVID-19.  Got first dose of vaccine and got dehydrated and constipated  6.  Social.  Here with daughter  Lives alone with aid that comes to the house    Bilateral 3D dx mammogram 01/2020    RTC 6 months      Luellen Pucker, MD FACS

## 2019-10-09 NOTE — Progress Notes (Signed)
Identified pt with two pt identifiers(name and DOB). Reviewed record in preparation for visit and have obtained necessary documentation. All patient medications has been reviewed.    Chief Complaint   Patient presents with   ??? Follow-up     6 month breast check       Health Maintenance Due   Topic   ??? COVID-19 Vaccine (1 of 2)   ??? Shingrix Vaccine Age 84> (1 of 2)   ??? GLAUCOMA SCREENING Q2Y    ??? Medicare Yearly Exam    ??? Flu Vaccine (1)       Vitals:    10/09/19 1138   BP: (!) 128/57   Pulse: 82   Resp: 14   Temp: (!) 96.6 ??F (35.9 ??C)   TempSrc: Temporal   SpO2: 92%   Height: 5\' 2"  (1.575 m)   PainSc:   5   PainLoc: Head       4.Have you been to the ER, urgent care clinic since your last visit?  Hospitalized since your last visit?No    5. Have you seen or consulted any other health care providers outside of the [place] since your last visit?  Include any pap smears or colon screening. No      Patient is accompanied by daughter I have received verbal consent from [name] to discuss any/all medical information while they are present in the room.

## 2019-10-09 NOTE — Progress Notes (Signed)
Identified pt with two pt identifiers(name and DOB). Reviewed record in preparation for visit and have obtained necessary documentation. All patient medications has been reviewed.    Chief Complaint   Patient presents with   . Follow-up     6 month breast check       Health Maintenance Due   Topic   . COVID-19 Vaccine (1 of 2)   . Shingrix Vaccine Age 84> (1 of 2)   . GLAUCOMA SCREENING Q2Y    . Medicare Yearly Exam    . Flu Vaccine (1)       Vitals:    10/09/19 1138   BP: (!) 128/57   Pulse: 82   Resp: 14   Temp: (!) 96.6 F (35.9 C)   TempSrc: Temporal   SpO2: 92%   Height: 5\' 2"  (1.575 m)   PainSc:   5   PainLoc: Head       4.Have you been to the ER, urgent care clinic since your last visit?  Hospitalized since your last visit?No    5. Have you seen or consulted any other health care providers outside of the [facility] since your last visit?  Include any pap smears or colon screening. No      Patient is accompanied by daughter I have received verbal consent from Carolyn Stare to discuss any/all medical information while they are present in the room.

## 2019-10-09 NOTE — Progress Notes (Signed)
Progress Notes by Luellen Pucker, MD at 10/09/19 1100                Author: Luellen Pucker, MD  Service: --  Author Type: Physician       Filed: 10/09/19 1208  Encounter Date: 10/09/2019  Status: Signed          Editor: Luellen Pucker, MD (Physician)               HISTORY OF PRESENT ILLNESS   Kimberly [name] is a 86 y.o.  female who comes in for follow up by Hilbert Odor, MD for  breast cancer   Breast Problem   Associated  symptoms include headaches. Pertinent negatives include no chest pain, no abdominal pain and no shortness of breath.    Breast Cancer   Associated symptoms include headaches. Pertinent negatives include no chest pain,  no abdominal pain and no shortness of breath.    Follow-up   Associated symptoms include headaches. Pertinent negatives include no chest pain,  no abdominal pain and no shortness of breath.    she went for screening mammogram and was noted to have a density in the UOQ of the left breast.  Subsequently she had an [place] confirming a 2.0 x 1.8 x 1.6 cm in the 0100 position 6 cm from nipple.   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  She had not felt a lump, skin changes, nipple changes or drainage, unexplained weight loss or bone pain.  She had menarche at 35, only pregnancy at  82, menopause at 65 and did not take HRT.  Her MGM, M, D, and sister all died from metastatic breast cancer.   She underwent a left lumpectomy for a T2NxMx IDC 03/10/2018.  She received adjuvant WBRT Briscoe Burns).  She had a bilateral 3D dx mammogram 01/12/2019.        Past Medical History:        Diagnosis  Date         ?  Adverse effect of anesthesia            slow to wake         ?  Anginal pain ([place])       ?  Arthritis       ?  Breast cancer ([place])  03/10/2018           left lumpectomy         ?  Cancer ([place])  01/2018          breast         ?  Chronic pain            neuropathy         ?  Constipation        ?  GERD (gastroesophageal reflux disease)       ?  Hearing loss       ?  High cholesterol       ?  Hypertension       ?  Incontinence       ?  Joint pain       ?  Memory disorder       ?  Muscle pain       ?  Osteoporosis       ?  Ringing in the ears       ?  Snoring           ?  Visual disturbance            Past Surgical History:         Procedure  Laterality  Date          ?  HX BREAST LUMPECTOMY  Left  03/10/2018          LEFT BREAST LUMPECTOMY WITH ULTRASOUND GUIDANCE performed by Luellen Pucker, MD at [facility]          ?  HX HYSTERECTOMY         ?  HX ORTHOPAEDIC              epidural pain mtg - pt unsure          ?  HX OTHER SURGICAL  Right  10/15/2016          ligament correction right foot          ?  HX OTHER SURGICAL    11/26/2016          Pituitary tumor removal          Family History         Problem  Relation  Age of Onset          ?  Breast Cancer  Mother  6     ?  No Known Problems  Father       ?  Breast Cancer  Sister  30     ?  Breast Cancer  Daughter  16     ?  Dementia  Brother            ?  Dementia  Sister            Social History          Tobacco Use         ?  Smoking status:  Never Smoker     ?  Smokeless tobacco:  Never Used       Substance Use Topics         ?  Alcohol use:  No         ?  Drug use:  No          Current Outpatient Medications        Medication  Sig         ?  cilostazoL (PLETAL) 100 mg tablet  Take 1 tablet twice a day     ?  verapamil ER (CALAN-SR) 180 mg CR tablet  TAKE 1 TABLET BY MOUTH EVERY NIGHT     ?  oxybutynin (DITROPAN) 5 mg tablet  Take 1 tablet twice a day     ?  pregabalin (LYRICA) 100 mg capsule  TAKE 1 CAPSULE BY MOUTH TWICE DAILY     ?  pregabalin (LYRICA) 150 mg capsule  Take 1 Cap by mouth two (2) times a day. Max Daily Amount: 300 mg.     ?  ezetimibe (ZETIA) 10 mg tablet  TAKE 1 TABLET BY MOUTH DAILY     ?  folic acid (FOLVITE) 1 mg tablet  Take 1 Tab by mouth daily.     ?  omeprazole (PRILOSEC) 40 mg capsule  TAKE 1 CAPSULE BY MOUTH  EVERY DAY     ?  famotidine (PEPCID) 20 mg tablet  Take 1 Tab by mouth two (2) times a day.     ?  promethazine (PHENERGAN) 12.5 mg tablet  Take 1 Tab by mouth every six (6) hours as needed  for Nausea.     ?  traMADol (ULTRAM) 50 mg tablet  every eight (8) hours as needed. 1/2 tablet     ?  alendronate (FOSAMAX) 70 mg tablet  TAKE 1 TABLET BY MOUTH EVERY 7 DAYS     ?  acetaminophen (TYLENOL) 500 mg tablet  Take 1,000 mg by mouth every six (6) hours as needed for Pain.     ?  estradiol (VAGIFEM) 10 mcg tab vaginal tablet  INSERT 1 T VAGINALLY TWICE PER WEEK     ?  isosorbide dinitrate (ISORDIL) 10 mg tablet  TAKE 1 TABLET BY MOUTH TWICE DAILY     ?  lisinopril (PRINIVIL) 10 mg tablet  Take 1 Tab by mouth daily.     ?  polyethylene glycol (MIRALAX) 17 gram/dose powder  TAKE 1 CAPFUL PO ONCE A DAY         ?  aspirin 81 mg chewable tablet  Take 81 mg by mouth daily.          No current facility-administered medications for this visit.           Allergies        Allergen  Reactions         ?  Pcn [Penicillins]  Hives and Myalgia         ?  Sulfa (Sulfonamide Antibiotics)  Unknown (comments)              Review of Systems    Constitutional: Positive for malaise/fatigue. Negative for chills, diaphoresis, fever and weight loss.    HENT: Positive for hearing loss. Negative for sore throat.     Eyes: Negative for blurred vision and discharge.         Vision loss    Respiratory: Negative for cough, shortness of breath and wheezing.     Cardiovascular: Positive for leg swelling. Negative for chest pain, palpitations, orthopnea and claudication.    Gastrointestinal: Positive for nausea. Negative for abdominal pain, constipation, diarrhea, heartburn, melena and vomiting.    Genitourinary: Positive for dysuria. Negative for flank pain, frequency and hematuria.    Musculoskeletal: Positive for joint pain and myalgias . Negative for back pain and neck pain.    Skin: Negative for rash.    Neurological: Positive for headaches.  Negative for dizziness, speech change, focal weakness, seizures, loss of consciousness and weakness.     Endo/Heme/Allergies: Does not bruise/bleed easily.    Psychiatric/Behavioral: Negative for depression and memory loss.       Visit Vitals      BP  (!) 128/57 (BP 1 Location: Left upper arm, BP Patient Position: Sitting, BP Cuff Size: Adult)     Pulse  82     Temp  (!) 96.6 ??F (35.9 ??C) (Temporal)     Resp  14     Ht  5\' 2"  (1.575 m)     SpO2  92%        BMI  22.58 kg/m??           Physical Exam   Exam conducted with a chaperone present.   Constitutional:        General: She is not in acute distress.     Appearance: She is well-developed. She is not diaphoretic.    HENT:       Head: Normocephalic and atraumatic.      Mouth/Throat:      Pharynx: No oropharyngeal exudate.    Eyes:       General:  No scleral icterus.     Conjunctiva/sclera: Conjunctivae normal.      Pupils: Pupils are equal, round, and reactive to light.   Neck:       Musculoskeletal: Normal range of motion and neck supple.      Thyroid: No thyromegaly.      Vascular: No JVD.      Trachea: No tracheal deviation.   Cardiovascular:       Rate and Rhythm: Normal rate and regular rhythm.      Heart sounds: No murmur. No friction rub. No gallop.     Pulmonary:       Effort: Pulmonary effort is normal. No respiratory distress.      Breath sounds: Normal breath sounds. No wheezing or rales.   Chest:       Breasts: Breasts are symmetrical.          Right: Normal. No inverted nipple, mass, nipple discharge, skin change or tenderness.         Left: Mass  (induration at lumpectomy site) and skin change  (radiation changes ) present. No inverted nipple, nipple discharge or tenderness.         Abdominal :      General: Bowel sounds are normal. There is no distension.      Palpations: Abdomen is soft. There is no mass.      Tenderness: There is no abdominal tenderness. There is no guarding or rebound.     Musculoskeletal: Normal range of motion.     Lymphadenopathy:        Cervical: No cervical adenopathy.      Upper Body:      Right upper body: No supraclavicular adenopathy.      Left upper body: No supraclavicular adenopathy.   Skin:      General: Skin is warm and dry.      Coloration: Skin is not pale.      Findings: No erythema or rash.    Neurological:       Mental Status: She is alert and oriented to person, place, and time.      Cranial Nerves: No cranial nerve deficit.    Psychiatric:         Behavior: Behavior normal.         Thought Content: Thought content normal.         Judgment: Judgment normal.                ASSESSMENT and PLAN   1.  Left breast cancer early stage, Clinically stage 2A (T2N0M0). Triple negative Dx 01/2018.   NED at 1 year and 9 months   2.  Strong family hx breast cancer      3.  Allergy to PCN with hives and upset stomach.  Ok for Ancef   4.  Essential hypertension.  On rx   5.  COVID-19.  Got first dose of vaccine and got dehydrated and constipated   6.  Social.   Here with daughter   Lives alone with aid that comes to the house      Bilateral 3D dx mammogram 01/2020      RTC 6 months         Luellen Pucker, MD FACS

## 2019-10-14 NOTE — Progress Notes (Signed)
Patient resolved from [place] Transitions episode on 10/14/2019.  Discussed COVID-19 related testing which was not done at this time. Test results were not done. Patient informed of results, if available? no     Patient/family has been provided the following resources and education related to COVID-19:                         Signs, symptoms and red flags related to COVID-19            CDC exposure and quarantine guidelines            [facility] exposure contact - [phone]            Contact for their local Department of Health                 Patient currently reports that the following symptoms have improved:  fatigue.    No further outreach scheduled with this CTN/ACM/LPN/HC/ MA.  Episode of Care resolved.  Patient has this CTN/ACM/LPN/HC/MA contact information if future needs arise.

## 2019-10-14 NOTE — Progress Notes (Signed)
Patient resolved from COVID Care Transitions episode on 10/14/2019.  Discussed COVID-19 related testing which was not done at this time. Test results were not done. Patient informed of results, if available? no     Patient/family has been provided the following resources and education related to COVID-19:                         Signs, symptoms and red flags related to COVID-19            CDC exposure and quarantine guidelines            [facility] exposure contact - [phone]            Contact for their local Department of Health                 Patient currently reports that the following symptoms have improved:  fatigue.    No further outreach scheduled with this CTN/ACM/LPN/HC/ MA.  Episode of Care resolved.  Patient has this CTN/ACM/LPN/HC/MA contact information if future needs arise.

## 2019-10-26 ENCOUNTER — Telehealth: Admit: 2019-10-26 | Payer: MEDICARE | Attending: Internal Medicine | Primary: Internal Medicine

## 2019-10-26 ENCOUNTER — Telehealth: Attending: Internal Medicine | Primary: Family Medicine

## 2019-10-26 DIAGNOSIS — K5900 Constipation, unspecified: Secondary | ICD-10-CM

## 2019-10-26 MED ORDER — DOCUSATE SODIUM 100 MG CAP
100 mg | ORAL_CAPSULE | Freq: Two times a day (BID) | ORAL | 0 refills | Status: AC
Start: 2019-10-26 — End: 2020-01-24

## 2019-10-26 NOTE — Progress Notes (Signed)
Kimberly [name] is a 86 y.o. female who was seen by synchronous (real-time) audio-video technology on 10/26/2019 for No chief complaint on file.        Assessment & Plan:   Diagnoses and all orders for this visit:    1. Constipation, unspecified constipation type  -     REFERRAL TO GASTROENTEROLOGY    Other orders  -     docusate sodium (COLACE) 100 mg capsule; Take 1 Cap by mouth two (2) times a day for 90 days.      Start Colace 100 bid  Increase fluid  Prune juice, prunes, dietary fiber  miralax tonight  Refer to GI  To ER tomorrow if no response    Subjective:     Patient and her private aide call today because she is having constipation. She has been taking prunes and prune juice. She has increased intake of fruit and fruit juice. Has been going on for 2 weeks. She had 2 very small pieces of feces passed this am, no nausea, appetite ok.  was given 2 days ago sennosides laxative otc  Of note 3 weeks ago went to ER after trying enemas a home. She was impacted at that time and underwent  Manual disimpaction. Discharged home and instructed to take mag citrate.                covid x 2 done    Prior to Admission medications    Medication Sig Start Date End Date Taking? Authorizing Provider   cilostazoL (PLETAL) 100 mg tablet Take 1 tablet twice a day 09/13/19   Hilbert Odor, MD   verapamil ER (CALAN-SR) 180 mg CR tablet TAKE 1 TABLET BY MOUTH EVERY NIGHT 09/13/19   Latham-Solomon, Clarita Leber, MD   oxybutynin (DITROPAN) 5 mg tablet Take 1 tablet twice a day 09/13/19   Latham-Solomon, Clarita Leber, MD   pregabalin (LYRICA) 100 mg capsule TAKE 1 CAPSULE BY MOUTH TWICE DAILY 08/28/19   Aralu, Cletus C, MD   pregabalin (LYRICA) 150 mg capsule Take 1 Cap by mouth two (2) times a day. Max Daily Amount: 300 mg. 04/07/19   Aralu, Cletus C, MD   ezetimibe (ZETIA) 10 mg tablet TAKE 1 TABLET BY MOUTH DAILY 03/22/19   Latham-Solomon, Clarita Leber, MD   folic acid (FOLVITE) 1 mg tablet Take 1 Tab by mouth daily. 02/05/19   Aralu, Cletus C, MD    omeprazole (PRILOSEC) 40 mg capsule TAKE 1 CAPSULE BY MOUTH EVERY DAY 11/14/18   Latham-Solomon, Clarita Leber, MD   famotidine (PEPCID) 20 mg tablet Take 1 Tab by mouth two (2) times a day. 10/28/18   Latham-Solomon, Clarita Leber, MD   promethazine (PHENERGAN) 12.5 mg tablet Take 1 Tab by mouth every six (6) hours as needed for Nausea. 10/28/18   Latham-Solomon, Clarita Leber, MD   traMADol Veatrice Bourbon) 50 mg tablet every eight (8) hours as needed. 1/2 tablet 09/01/18   Provider, Historical   alendronate (FOSAMAX) 70 mg tablet TAKE 1 TABLET BY MOUTH EVERY 7 DAYS 06/16/18   Latham-Solomon, Clarita Leber, MD   acetaminophen (TYLENOL) 500 mg tablet Take 1,000 mg by mouth every six (6) hours as needed for Pain.    Provider, Historical   estradiol (VAGIFEM) 10 mcg tab vaginal tablet INSERT 1 T VAGINALLY TWICE PER WEEK 07/01/17   Provider, Historical   isosorbide dinitrate (ISORDIL) 10 mg tablet TAKE 1 TABLET BY MOUTH TWICE DAILY 12/18/16   Latham-Solomon, Clarita Leber, MD  lisinopril (PRINIVIL) 10 mg tablet Take 1 Tab by mouth daily. 11/30/16   Katha Cabal I, MD   polyethylene glycol [facility]) 17 gram/dose powder TAKE 1 CAPFUL PO ONCE A DAY 09/15/15   Provider, Historical   aspirin 81 mg chewable tablet Take 81 mg by mouth daily.    Provider, Historical         Review of Systems   Gastrointestinal: Positive for constipation.       Objective:   No flowsheet data found.     [INSTRUCTIONS:  "[x] " Indicates a positive item  "[] " Indicates a negative item  -- DELETE ALL ITEMS NOT EXAMINED]    Constitutional: [x]  Appears well-developed and well-nourished [x]  No apparent distress      []  Abnormal -     Mental status: [x]  Alert and awake  [x]  Oriented to person/place/time [x]  Able to follow commands    []  Abnormal -     Eyes:   EOM    [x]   Normal    []  Abnormal -   Sclera  [x]   Normal    []  Abnormal -          Discharge [x]   None visible   []  Abnormal -     HENT: [x]  Normocephalic, atraumatic  []  Abnormal -   [x]  Mouth/Throat: Mucous membranes are moist     External Ears [x]  Normal  []  Abnormal -    Neck: [x]  No visualized mass []  Abnormal -     Pulmonary/Chest: [x]  Respiratory effort normal   [x]  No visualized signs of difficulty breathing or respiratory distress        []  Abnormal -      Musculoskeletal:   [x]  Normal gait with no signs of ataxia         [x]  Normal range of motion of neck        []  Abnormal -     Neurological:        [x]  No Facial Asymmetry (Cranial nerve 7 motor function) (limited exam due to video visit)          [x]  No gaze palsy        []  Abnormal -          Skin:        [x]  No significant exanthematous lesions or discoloration noted on facial skin         []  Abnormal -            Psychiatric:       [x]  Normal Affect []  Abnormal -        [x]  No Hallucinations    Other pertinent observable physical exam findings:-        We discussed the expected course, resolution and complications of the diagnosis(es) in detail.  Medication risks, benefits, costs, interactions, and alternatives were discussed as indicated.  I advised her to contact the office if her condition worsens, changes or fails to improve as anticipated. She expressed understanding with the diagnosis(es) and plan.       [name], was evaluated through a synchronous (real-time) audio-video encounter. The patient (or guardian if applicable) is aware that this is a billable service. Verbal consent to proceed has been obtained within the past 12 months. The visit was conducted pursuant to the emergency declaration under the [place], [place] waiver authority and the [employer] Act.  Patient identification was verified, and a caregiver was present when appropriate.  The patient was located in a state where the provider was credentialed to provide care.      Hilbert Odor, MD

## 2019-10-26 NOTE — Progress Notes (Signed)
Kimberly [name] is a 86 y.o. female who was seen by synchronous (real-time) audio-video technology on 10/26/2019 for No chief complaint on file.        Assessment & Plan:   Diagnoses and all orders for this visit:    1. Constipation, unspecified constipation type  -     REFERRAL TO GASTROENTEROLOGY    Other orders  -     docusate sodium (COLACE) 100 mg capsule; Take 1 Cap by mouth two (2) times a day for 90 days.      Start Colace 100 bid  Increase fluid  Prune juice, prunes, dietary fiber  miralax tonight  Refer to GI  To ER tomorrow if no response    Subjective:     Patient and her private aide call today because she is having constipation. She has been taking prunes and prune juice. She has increased intake of fruit and fruit juice. Has been going on for 2 weeks. She had 2 very small pieces of feces passed this am, no nausea, appetite ok.  was given 2 days ago sennosides laxative otc  Of note 3 weeks ago went to ER after trying enemas a home. She was impacted at that time and underwent  Manual disimpaction. Discharged home and instructed to take mag citrate.                covid x 2 done    Prior to Admission medications    Medication Sig Start Date End Date Taking? Authorizing Provider   cilostazoL (PLETAL) 100 mg tablet Take 1 tablet twice a day 09/13/19   Hilbert Odor, MD   verapamil ER (CALAN-SR) 180 mg CR tablet TAKE 1 TABLET BY MOUTH EVERY NIGHT 09/13/19   Latham-Solomon, Clarita Leber, MD   oxybutynin (DITROPAN) 5 mg tablet Take 1 tablet twice a day 09/13/19   Latham-Solomon, Clarita Leber, MD   pregabalin (LYRICA) 100 mg capsule TAKE 1 CAPSULE BY MOUTH TWICE DAILY 08/28/19   Aralu, Cletus C, MD   pregabalin (LYRICA) 150 mg capsule Take 1 Cap by mouth two (2) times a day. Max Daily Amount: 300 mg. 04/07/19   Aralu, Cletus C, MD   ezetimibe (ZETIA) 10 mg tablet TAKE 1 TABLET BY MOUTH DAILY 03/22/19   Latham-Solomon, Clarita Leber, MD   folic acid (FOLVITE) 1 mg tablet Take 1 Tab by mouth daily. 02/05/19   Aralu, Cletus C, MD    omeprazole (PRILOSEC) 40 mg capsule TAKE 1 CAPSULE BY MOUTH EVERY DAY 11/14/18   Latham-Solomon, Clarita Leber, MD   famotidine (PEPCID) 20 mg tablet Take 1 Tab by mouth two (2) times a day. 10/28/18   Latham-Solomon, Clarita Leber, MD   promethazine (PHENERGAN) 12.5 mg tablet Take 1 Tab by mouth every six (6) hours as needed for Nausea. 10/28/18   Latham-Solomon, Clarita Leber, MD   traMADol Veatrice Bourbon) 50 mg tablet every eight (8) hours as needed. 1/2 tablet 09/01/18   Provider, Historical   alendronate (FOSAMAX) 70 mg tablet TAKE 1 TABLET BY MOUTH EVERY 7 DAYS 06/16/18   Latham-Solomon, Clarita Leber, MD   acetaminophen (TYLENOL) 500 mg tablet Take 1,000 mg by mouth every six (6) hours as needed for Pain.    Provider, Historical   estradiol (VAGIFEM) 10 mcg tab vaginal tablet INSERT 1 T VAGINALLY TWICE PER WEEK 07/01/17   Provider, Historical   isosorbide dinitrate (ISORDIL) 10 mg tablet TAKE 1 TABLET BY MOUTH TWICE DAILY 12/18/16   Latham-Solomon, Clarita Leber, MD  lisinopril (PRINIVIL) 10 mg tablet Take 1 Tab by mouth daily. 11/30/16   Katha Cabal I, MD   polyethylene glycol [facility]) 17 gram/dose powder TAKE 1 CAPFUL PO ONCE A DAY 09/15/15   Provider, Historical   aspirin 81 mg chewable tablet Take 81 mg by mouth daily.    Provider, Historical         Review of Systems   Gastrointestinal: Positive for constipation.       Objective:   No flowsheet data found.     [INSTRUCTIONS:  "[x] " Indicates a positive item  "[] " Indicates a negative item  -- DELETE ALL ITEMS NOT EXAMINED]    Constitutional: [x]  Appears well-developed and well-nourished [x]  No apparent distress      []  Abnormal -     Mental status: [x]  Alert and awake  [x]  Oriented to person/place/time [x]  Able to follow commands    []  Abnormal -     Eyes:   EOM    [x]   Normal    []  Abnormal -   Sclera  [x]   Normal    []  Abnormal -          Discharge [x]   None visible   []  Abnormal -     HENT: [x]  Normocephalic, atraumatic  []  Abnormal -   [x]  Mouth/Throat: Mucous membranes are  moist    External Ears [x]  Normal  []  Abnormal -    Neck: [x]  No visualized mass []  Abnormal -     Pulmonary/Chest: [x]  Respiratory effort normal   [x]  No visualized signs of difficulty breathing or respiratory distress        []  Abnormal -      Musculoskeletal:   [x]  Normal gait with no signs of ataxia         [x]  Normal range of motion of neck        []  Abnormal -     Neurological:        [x]  No Facial Asymmetry (Cranial nerve 7 motor function) (limited exam due to video visit)          [x]  No gaze palsy        []  Abnormal -          Skin:        [x]  No significant exanthematous lesions or discoloration noted on facial skin         []  Abnormal -            Psychiatric:       [x]  Normal Affect []  Abnormal -        [x]  No Hallucinations    Other pertinent observable physical exam findings:-        We discussed the expected course, resolution and complications of the diagnosis(es) in detail.  Medication risks, benefits, costs, interactions, and alternatives were discussed as indicated.  I advised her to contact the office if her condition worsens, changes or fails to improve as anticipated. She expressed understanding with the diagnosis(es) and plan.       [name], was evaluated through a synchronous (real-time) audio-video encounter. The patient (or guardian if applicable) is aware that this is a billable service. Verbal consent to proceed has been obtained within the past 12 months. The visit was conducted pursuant to the emergency declaration under the [place], [place] waiver authority and the [employer] Act.  Patient identification was verified, and a caregiver was present when appropriate.  The patient was located in a state where the provider was credentialed to provide care.      Hilbert Odor, MD

## 2019-12-09 MED ORDER — VERAPAMIL ER 180 MG TAB
180 mg | ORAL_TABLET | ORAL | 0 refills | Status: DC
Start: 2019-12-09 — End: 2020-03-10

## 2019-12-09 MED ORDER — CILOSTAZOL 100 MG TAB
100 mg | ORAL_TABLET | ORAL | 0 refills | Status: DC
Start: 2019-12-09 — End: 2020-03-10

## 2020-01-13 ENCOUNTER — Inpatient Hospital Stay: Admit: 2020-01-13 | Payer: MEDICARE | Attending: Surgery | Primary: Internal Medicine

## 2020-01-13 ENCOUNTER — Encounter: Attending: Neurology | Primary: Internal Medicine

## 2020-01-13 DIAGNOSIS — C50412 Malignant neoplasm of upper-outer quadrant of left female breast: Secondary | ICD-10-CM

## 2020-02-12 MED ORDER — EZETIMIBE 10 MG TAB
10 mg | ORAL_TABLET | ORAL | 1 refills | Status: DC
Start: 2020-02-12 — End: 2020-03-23

## 2020-03-10 MED ORDER — CILOSTAZOL 100 MG TAB
100 mg | ORAL_TABLET | ORAL | 0 refills | Status: DC
Start: 2020-03-10 — End: 2020-06-13

## 2020-03-10 MED ORDER — OXYBUTYNIN CHLORIDE 5 MG TAB
5 mg | ORAL_TABLET | ORAL | 1 refills | Status: DC
Start: 2020-03-10 — End: 2020-12-12

## 2020-03-10 MED ORDER — VERAPAMIL ER 180 MG TAB
180 mg | ORAL_TABLET | ORAL | 0 refills | Status: DC
Start: 2020-03-10 — End: 2020-03-28

## 2020-03-21 ENCOUNTER — Encounter: Attending: Neurology | Primary: Internal Medicine

## 2020-03-23 ENCOUNTER — Encounter: Attending: Internal Medicine | Primary: Internal Medicine

## 2020-03-23 MED ORDER — EZETIMIBE 10 MG TAB
10 mg | ORAL_TABLET | Freq: Every day | ORAL | 0 refills | Status: DC
Start: 2020-03-23 — End: 2020-09-19

## 2020-03-28 ENCOUNTER — Ambulatory Visit: Admit: 2020-03-28 | Discharge: 2020-03-28 | Payer: MEDICARE | Attending: Internal Medicine | Primary: Internal Medicine

## 2020-03-28 ENCOUNTER — Ambulatory Visit: Attending: Internal Medicine | Primary: Family Medicine

## 2020-03-28 DIAGNOSIS — I10 Essential (primary) hypertension: Secondary | ICD-10-CM

## 2020-03-28 MED ORDER — VERAPAMIL ER 180 MG TAB
180 mg | ORAL_TABLET | Freq: Every evening | ORAL | 1 refills | Status: DC
Start: 2020-03-28 — End: 2020-10-30

## 2020-03-28 MED ORDER — LISINOPRIL 10 MG TAB
10 mg | ORAL_TABLET | Freq: Every day | ORAL | 1 refills | Status: DC
Start: 2020-03-28 — End: 2020-07-12

## 2020-03-28 MED ORDER — DOCUSATE SODIUM 100 MG CAP
100 mg | ORAL_CAPSULE | Freq: Two times a day (BID) | ORAL | 2 refills | Status: DC
Start: 2020-03-28 — End: 2020-07-04

## 2020-03-28 NOTE — Addendum Note (Signed)
Addendum Note by  Dominga Ferry at 03/28/20 1600                Author: Dominga Ferry  Service: --  Author Type: Technician       Filed: 04/13/20 1122  Encounter Date: 03/28/2020  Status: Signed          Editor: Dominga Ferry (Technician)          Addended by: Dominga Ferry on: 04/13/2020 11:22 AM    Modules accepted: Orders

## 2020-03-28 NOTE — Progress Notes (Signed)
Subjective:      Kimberly [name] is a 86 y.o. female who presents today for   Chief Complaint   Patient presents with   ??? Blood Pressure Check     Patient in today for bp check. She says her BP was very high at her podiatrist's office  She denies any cp, sob, blurred vision or headaches.   Today in office BP is normal, overall she feels well    She requests refill of her antihypertensive medication she requests verapamil, has not been taking lisinopril and thinks she must have run out    Continues pletal for PVD    Continues fosamax for osteoporosis    Continues zetia for hyperlipidemia    Continues lyrica for neuropathy    Continue ditropan for incontinence    She does report some issues with chronic constipation. Says stools are often hard        Patient Active Problem List    Diagnosis Date Noted   ??? Malignant neoplasm of upper-outer quadrant of left breast in female, estrogen receptor negative ([place]) 01/28/2018   ??? Syncope and collapse 07/18/2016   ??? Headache disorder 07/18/2016   ??? 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 Memory loss 11/21/2014   ??? Acute bacterial conjunctivitis of right eye 11/21/2014   ??? SOB (shortness of breath) on exertion 06/21/2014   ??? Chest pain with normal angiography--~ 15 yrs ago [facility]CV 06/21/2014   ??? Hypertension, essential, benign    ??? High cholesterol      Current Outpatient Medications   Medication Sig Dispense Refill   ??? ezetimibe (ZETIA) 10 mg tablet Take 1 Tablet by mouth daily. 90 Tablet 0   ??? cilostazoL (PLETAL) 100 mg tablet TAKE 1 TABLET BY MOUTH TWICE DAILY 180 Tablet 0   ??? verapamil ER (CALAN-SR) 180 mg CR tablet TAKE 1 TABLET BY MOUTH EVERY NIGHT 90 Tablet 0   ??? oxybutynin (DITROPAN) 5 mg tablet TAKE 1 TABLET BY MOUTH TWICE DAILY 180 Tablet 1   ??? pregabalin (LYRICA) 100 mg capsule TAKE 1 CAPSULE BY MOUTH TWICE DAILY 60 Cap 3   ??? pregabalin (LYRICA) 150 mg capsule Take 1 Cap by mouth two (2) times a day. Max Daily Amount: 300 mg. 60 Cap 2   ??? folic acid (FOLVITE) 1 mg tablet Take 1  Tab by mouth daily. 90 Tab 3   ??? omeprazole (PRILOSEC) 40 mg capsule TAKE 1 CAPSULE BY MOUTH EVERY DAY 90 Cap 3   ??? famotidine (PEPCID) 20 mg tablet Take 1 Tab by mouth two (2) times a day. 60 Tab 3   ??? promethazine (PHENERGAN) 12.5 mg tablet Take 1 Tab by mouth every six (6) hours as needed for Nausea. 30 Tab 0   ??? traMADol (ULTRAM) 50 mg tablet every eight (8) hours as needed. 1/2 tablet     ??? alendronate (FOSAMAX) 70 mg tablet TAKE 1 TABLET BY MOUTH EVERY 7 DAYS 4 Tab 5   ??? acetaminophen (TYLENOL) 500 mg tablet Take 1,000 mg by mouth every six (6) hours as needed for Pain.     ??? estradiol (VAGIFEM) 10 mcg tab vaginal tablet INSERT 1 T VAGINALLY TWICE PER WEEK  3   ??? lisinopril (PRINIVIL) 10 mg tablet Take 1 Tab by mouth daily. 30 Tab 0   ??? polyethylene glycol (MIRALAX) 17 gram/dose powder TAKE 1 CAPFUL PO ONCE A DAY  11   ??? aspirin 81 mg chewable tablet Take 81 mg by mouth daily.     ???  isosorbide dinitrate (ISORDIL) 10 mg tablet TAKE 1 TABLET BY MOUTH TWICE DAILY 180 Tab 0     Allergies   Allergen Reactions   ??? Pcn [Penicillins] Hives and Myalgia   ??? Sulfa (Sulfonamide Antibiotics) Unknown (comments)     Past Medical History:   Diagnosis Date   ??? Adverse effect of anesthesia     slow to wake   ??? Anginal pain (HCC)    ??? Arthritis    ??? Breast cancer ([place]) 03/10/2018     left lumpectomy   ??? Cancer ([place]) 01/2018    breast   ??? Chronic pain     neuropathy   ??? Constipation    ??? GERD (gastroesophageal reflux disease)    ??? Hearing loss    ??? High cholesterol    ??? Hypertension    ??? Incontinence    ??? Joint pain    ??? Memory disorder    ??? Muscle pain    ??? Osteoporosis    ??? Ringing in the ears    ??? Snoring    ??? Visual disturbance      Past Surgical History:   Procedure Laterality Date   ??? HX BREAST LUMPECTOMY Left 03/10/2018    LEFT BREAST LUMPECTOMY WITH ULTRASOUND GUIDANCE performed by Luellen Pucker, MD at [facility]   ??? HX HYSTERECTOMY     ??? HX ORTHOPAEDIC      epidural pain mtg - pt unsure   ??? HX OTHER SURGICAL  Right 10/15/2016    ligament correction right foot   ??? HX OTHER SURGICAL  11/26/2016    Pituitary tumor removal     Family History   Problem Relation Age of Onset   ??? Breast Cancer Mother 3   ??? No Known Problems Father    ??? Breast Cancer Sister 55   ??? Breast Cancer Daughter 71   ??? Dementia Brother    ??? Dementia Sister      Social History     Tobacco Use   ??? Smoking status: Never Smoker   ??? Smokeless tobacco: Never Used   Substance Use Topics   ??? Alcohol use: No        Review of Systems    A comprehensive review of systems was negative except for that written in the HPI.     Objective:     Visit Vitals  BP 115/63 (BP 1 Location: Right arm, BP Patient Position: Sitting)   Pulse 93   Temp 97.9 ??F (36.6 ??C) (Oral)   Resp 18   Ht 5\' 2"  (1.575 m)   Wt 136 lb (61.7 kg)   SpO2 97%   BMI 24.87 kg/m??     General:  Alert, cooperative, no distress, appears stated age.   Head:  Normocephalic, without obvious abnormality, atraumatic.   Eyes:  Conjunctivae/corneas clear. PERRL, EOMs intact. Fundi benign.   Ears:  Normal TMs and external ear canals both ears.   Nose: Nares normal. Septum midline. Mucosa normal. No drainage or sinus tenderness.   Throat: Lips, mucosa, and tongue normal. Teeth and gums normal.   Neck: Supple, symmetrical, trachea midline, no adenopathy, thyroid: no enlargement/tenderness/nodules, no carotid bruit and no JVD.   Back:   Symmetric, no curvature. ROM normal. No CVA tenderness.   Lungs:   Clear to auscultation bilaterally.   Chest wall:  No tenderness or deformity.   Heart:  Regular rate and rhythm, S1, S2 normal, no murmur, click, rub or gallop.   Abdomen:  Soft, non-tender. Bowel sounds normal. No masses,  No organomegaly.   Extremities: Extremities normal, atraumatic, no cyanosis or edema.   Pulses: 2+ and symmetric all extremities.   Skin: Skin color, texture, turgor normal. No rashes or lesions.   Lymph nodes: Cervical, supraclavicular, and axillary nodes normal.   Neurologic: CNII-XII intact.  Normal strength, sensation and reflexes throughout.       Assessment/Plan:       ICD-10-CM ICD-9-CM    1. Hypertension, essential, benign  W25 852.7 METABOLIC PANEL, COMPREHENSIVE   2. High cholesterol  E78.00 272.0 LIPID PANEL   3. Anemia, unspecified type  D64.9 285.9 CBC WITH AUTOMATED DIFF   4. Constipation, unspecified constipation type  K59.00 564.00 Increase water intake  Colace bid  Increase fiber, prunes, prune juice  miralax prn   5. Urinary incontinence, unspecified type  R32 788.30 ditropan   6. PVD (peripheral vascular disease) (HCC)  I73.9 443.9 pletal   7. Other osteoporosis without current pathological fracture  M81.8 733.09 fosamax          Advised her to call back or return to office if symptoms worsen/change/persist.  Discussed expected course/resolution/complications of diagnosis in detail with patient.    Medication risks/benefits/costs/interactions/alternatives discussed with patient.  She was given an after visit summary which includes diagnoses, current medications, & vitals.  She expressed understanding with the diagnosis and plan.--+--+++++

## 2020-03-28 NOTE — Progress Notes (Signed)
Kimberly [name] is a 86 y.o. female    Chief Complaint   Patient presents with   . Blood Pressure Check     1. Have you been to the ER, urgent care clinic since your last visit?  Hospitalized since your last visit? No      2. Have you seen or consulted any other health care providers outside of the [facility] since your last visit?  Include any pap smears or colon screening.  No

## 2020-04-12 ENCOUNTER — Ambulatory Visit: Payer: MEDICARE | Attending: Surgery | Primary: Internal Medicine

## 2020-04-14 LAB — CBC WITH AUTO DIFFERENTIAL
Basophils %: 1 % (ref 0–1)
Basophils Absolute: 0 10*3/uL (ref 0.0–0.1)
Eosinophils %: 5 % (ref 0–7)
Eosinophils Absolute: 0.2 10*3/uL (ref 0.0–0.4)
Granulocyte Absolute Count: 0 10*3/uL (ref 0.00–0.04)
Hematocrit: 37.3 % (ref 35.0–47.0)
Hemoglobin: 11.5 g/dL (ref 11.5–16.0)
Immature Granulocytes: 1 % — ABNORMAL HIGH (ref 0.0–0.5)
Lymphocytes %: 16 % (ref 12–49)
Lymphocytes Absolute: 0.6 10*3/uL — ABNORMAL LOW (ref 0.8–3.5)
MCH: 22.9 PG — ABNORMAL LOW (ref 26.0–34.0)
MCHC: 30.8 g/dL (ref 30.0–36.5)
MCV: 74.3 FL — ABNORMAL LOW (ref 80.0–99.0)
MPV: 12.1 FL (ref 8.9–12.9)
Monocytes %: 11 % (ref 5–13)
Monocytes Absolute: 0.4 10*3/uL (ref 0.0–1.0)
NRBC Absolute: 0 10*3/uL (ref 0.00–0.01)
Neutrophils %: 66 % (ref 32–75)
Neutrophils Absolute: 2.5 10*3/uL (ref 1.8–8.0)
Nucleated RBCs: 0 PER 100 WBC
Platelets: 267 10*3/uL (ref 150–400)
RBC: 5.02 M/uL (ref 3.80–5.20)
RDW: 15.8 % — ABNORMAL HIGH (ref 11.5–14.5)
WBC: 3.7 10*3/uL (ref 3.6–11.0)

## 2020-04-14 LAB — COMPREHENSIVE METABOLIC PANEL
ALT: 22 U/L (ref 12–78)
AST: 19 U/L (ref 15–37)
Albumin/Globulin Ratio: 0.8 — ABNORMAL LOW (ref 1.1–2.2)
Albumin: 3.1 g/dL — ABNORMAL LOW (ref 3.5–5.0)
Alkaline Phosphatase: 85 U/L (ref 45–117)
Anion Gap: 2 mmol/L — ABNORMAL LOW (ref 5–15)
BUN: 20 MG/DL (ref 6–20)
Bun/Cre Ratio: 22 — ABNORMAL HIGH (ref 12–20)
CO2: 25 mmol/L (ref 21–32)
Calcium: 8.9 MG/DL (ref 8.5–10.1)
Chloride: 114 mmol/L — ABNORMAL HIGH (ref 97–108)
Creatinine: 0.9 MG/DL (ref 0.55–1.02)
EGFR IF NonAfrican American: 58 mL/min/{1.73_m2} — ABNORMAL LOW (ref >60)
GFR African American: >60 mL/min/{1.73_m2} (ref >60)
Globulin: 3.9 g/dL (ref 2.0–4.0)
Glucose: 91 mg/dL (ref 65–100)
Potassium: 4.5 mmol/L (ref 3.5–5.1)
Sodium: 141 mmol/L (ref 136–145)
Total Bilirubin: 0.4 MG/DL (ref 0.2–1.0)
Total Protein: 7 g/dL (ref 6.4–8.2)

## 2020-04-14 LAB — LIPID PANEL
CHOL/HDL Ratio: 2.6 (ref 0.0–5.0)
Chol/HDL Ratio: 2.6 (ref 0.0–5.0)
Cholesterol, Total: 181 MG/DL (ref <200)
Cholesterol, total: 181 MG/DL (ref <200)
HDL Cholesterol: 69 MG/DL
HDL: 69 MG/DL
LDL Calculated: 100 MG/DL (ref 0–100)
LDL, calculated: 100 MG/DL (ref 0–100)
Triglyceride: 60 MG/DL (ref <150)
Triglycerides: 60 MG/DL (ref <150)
VLDL Cholesterol Calculated: 12 MG/DL
VLDL, calculated: 12 MG/DL

## 2020-04-14 LAB — CBC WITH AUTOMATED DIFF
ABS. BASOPHILS: 0 10*3/uL (ref 0.0–0.1)
ABS. EOSINOPHILS: 0.2 10*3/uL (ref 0.0–0.4)
ABS. IMM. GRANS.: 0 10*3/uL (ref 0.00–0.04)
ABS. LYMPHOCYTES: 0.6 10*3/uL — ABNORMAL LOW (ref 0.8–3.5)
ABS. MONOCYTES: 0.4 10*3/uL (ref 0.0–1.0)
ABS. NEUTROPHILS: 2.5 10*3/uL (ref 1.8–8.0)
ABSOLUTE NRBC: 0 10*3/uL (ref 0.00–0.01)
BASOPHILS: 1 % (ref 0–1)
EOSINOPHILS: 5 % (ref 0–7)
HCT: 37.3 % (ref 35.0–47.0)
HGB: 11.5 g/dL (ref 11.5–16.0)
IMMATURE GRANULOCYTES: 1 % — ABNORMAL HIGH (ref 0.0–0.5)
LYMPHOCYTES: 16 % (ref 12–49)
MCH: 22.9 PG — ABNORMAL LOW (ref 26.0–34.0)
MCHC: 30.8 g/dL (ref 30.0–36.5)
MCV: 74.3 FL — ABNORMAL LOW (ref 80.0–99.0)
MONOCYTES: 11 % (ref 5–13)
MPV: 12.1 FL (ref 8.9–12.9)
NEUTROPHILS: 66 % (ref 32–75)
NRBC: 0 PER 100 WBC
PLATELET: 267 10*3/uL (ref 150–400)
RBC: 5.02 M/uL (ref 3.80–5.20)
RDW: 15.8 % — ABNORMAL HIGH (ref 11.5–14.5)
WBC: 3.7 10*3/uL (ref 3.6–11.0)

## 2020-04-14 LAB — METABOLIC PANEL, COMPREHENSIVE
A-G Ratio: 0.8 — ABNORMAL LOW (ref 1.1–2.2)
ALT (SGPT): 22 U/L (ref 12–78)
AST (SGOT): 19 U/L (ref 15–37)
Albumin: 3.1 g/dL — ABNORMAL LOW (ref 3.5–5.0)
Alk. phosphatase: 85 U/L (ref 45–117)
Anion gap: 2 mmol/L — ABNORMAL LOW (ref 5–15)
BUN/Creatinine ratio: 22 — ABNORMAL HIGH (ref 12–20)
BUN: 20 MG/DL (ref 6–20)
Bilirubin, total: 0.4 MG/DL (ref 0.2–1.0)
CO2: 25 mmol/L (ref 21–32)
Calcium: 8.9 MG/DL (ref 8.5–10.1)
Chloride: 114 mmol/L — ABNORMAL HIGH (ref 97–108)
Creatinine: 0.9 MG/DL (ref 0.55–1.02)
GFR est AA: >60 mL/min/{1.73_m2} (ref >60)
GFR est non-AA: 58 mL/min/{1.73_m2} — ABNORMAL LOW (ref >60)
Globulin: 3.9 g/dL (ref 2.0–4.0)
Glucose: 91 mg/dL (ref 65–100)
Potassium: 4.5 mmol/L (ref 3.5–5.1)
Protein, total: 7 g/dL (ref 6.4–8.2)
Sodium: 141 mmol/L (ref 136–145)

## 2020-04-29 ENCOUNTER — Ambulatory Visit: Admit: 2020-04-29 | Payer: MEDICARE | Attending: Neurology | Primary: Internal Medicine

## 2020-04-29 ENCOUNTER — Ambulatory Visit: Attending: Neurology | Primary: Family Medicine

## 2020-04-29 DIAGNOSIS — G629 Polyneuropathy, unspecified: Secondary | ICD-10-CM

## 2020-04-29 NOTE — Progress Notes (Signed)
Neurology Progress Note    NAME:  Kimberly [name]   DOB:   05-09-1924   MRN:   [id number]     Date/Time:  04/29/2020  Subjective:    Kimberly [name] is a 86 y.o. female here today for follow up for meningioma, status post resection, headache, leg pain, numbness and tingling sensation  Patient says she has been doing fairly well pain has been minimal, she noted that her sleep has improved.  Patient currently is on Lyrica 150 mg twice daily, and she says she is doing well with it.  She denies any recent fall.   Headache has improved , according to patient, she rarely has headache.   Medication has been helpful.  Headache is throbbing in nature mostly on one side that is the left side, associated with blurry vision.  She however still experiences occasional blurry vision.  She however, denies loss of consciousness dysphagia or odynophagia.   Review of Systems - General ROS: positive for  - fatigue and sleep disturbance  Psychological ROS: positive for - anxiety and sleep disturbances  Ophthalmic ROS: positive for - blurry vision, decreased vision and photophobia  ENT ROS: positive for - headaches, tinnitus and visual changes  Allergy and Immunology ROS: negative  Hematological and Lymphatic ROS: negative  Endocrine ROS: negative  Respiratory ROS: no cough, shortness of breath, or wheezing  Cardiovascular ROS: no chest pain or dyspnea on exertion  Gastrointestinal ROS: no abdominal pain, change in bowel habits, or black or bloody stools  Genito-Urinary ROS: no dysuria, trouble voiding, or hematuria  Musculoskeletal ROS: Muscle weakness, joint pains  Neurological ROS: Headaches, numbness and tingling sensation,dizziness.  Dermatological ROS: negative          Medications reviewed:  Current Outpatient Medications   Medication Sig Dispense Refill   ??? verapamil ER (CALAN-SR) 180 mg CR tablet Take 1 Tablet by mouth nightly. 90 Tablet 1   ??? lisinopriL (PriniviL) 10 mg tablet Take 1 Tablet by mouth daily. 90 Tablet 1   ???  docusate sodium (COLACE) 100 mg capsule Take 1 Capsule by mouth two (2) times a day for 90 days. 60 Capsule 2   ??? ezetimibe (ZETIA) 10 mg tablet Take 1 Tablet by mouth daily. 90 Tablet 0   ??? cilostazoL (PLETAL) 100 mg tablet TAKE 1 TABLET BY MOUTH TWICE DAILY 180 Tablet 0   ??? oxybutynin (DITROPAN) 5 mg tablet TAKE 1 TABLET BY MOUTH TWICE DAILY 180 Tablet 1   ??? pregabalin (LYRICA) 100 mg capsule TAKE 1 CAPSULE BY MOUTH TWICE DAILY 60 Cap 3   ??? pregabalin (LYRICA) 150 mg capsule Take 1 Cap by mouth two (2) times a day. Max Daily Amount: 300 mg. 60 Cap 2   ??? folic acid (FOLVITE) 1 mg tablet Take 1 Tab by mouth daily. 90 Tab 3   ??? omeprazole (PRILOSEC) 40 mg capsule TAKE 1 CAPSULE BY MOUTH EVERY DAY 90 Cap 3   ??? famotidine (PEPCID) 20 mg tablet Take 1 Tab by mouth two (2) times a day. 60 Tab 3   ??? promethazine (PHENERGAN) 12.5 mg tablet Take 1 Tab by mouth every six (6) hours as needed for Nausea. 30 Tab 0   ??? traMADol (ULTRAM) 50 mg tablet every eight (8) hours as needed. 1/2 tablet     ??? alendronate (FOSAMAX) 70 mg tablet TAKE 1 TABLET BY MOUTH EVERY 7 DAYS 4 Tab 5   ??? acetaminophen (TYLENOL) 500 mg tablet Take 1,000 mg by mouth  every six (6) hours as needed for Pain.     ??? estradiol (VAGIFEM) 10 mcg tab vaginal tablet INSERT 1 T VAGINALLY TWICE PER WEEK  3   ??? isosorbide dinitrate (ISORDIL) 10 mg tablet TAKE 1 TABLET BY MOUTH TWICE DAILY 180 Tab 0   ??? polyethylene glycol (MIRALAX) 17 gram/dose powder TAKE 1 CAPFUL PO ONCE A DAY  11   ??? aspirin 81 mg chewable tablet Take 81 mg by mouth daily.          Objective:   Vitals:  Vitals:    04/29/20 1105   BP: 138/64   Pulse: 74   Resp: 18   SpO2: 99%   Weight: 138 lb (62.6 kg)   Height: 5\' 2"  (1.575 m)   PainSc:   0 - No pain       Lab Data Reviewed:  Lab Results   Component Value Date/Time    WBC 3.7 04/13/2020 11:14 AM    HCT 37.3 04/13/2020 11:14 AM    HGB 11.5 04/13/2020 11:14 AM    PLATELET 267 04/13/2020 11:14 AM       Lab Results   Component Value Date/Time    Sodium  141 04/13/2020 11:14 AM    Potassium 4.5 04/13/2020 11:14 AM    Chloride 114 (H) 04/13/2020 11:14 AM    CO2 25 04/13/2020 11:14 AM    Glucose 91 04/13/2020 11:14 AM    BUN 20 04/13/2020 11:14 AM    Creatinine 0.90 04/13/2020 11:14 AM    Calcium 8.9 04/13/2020 11:14 AM       No components found for: TROPQUANT    No results found for: ANA      No results found for: HBA1C, HBA1CPOC, HBA1CEXT     Lab Results   Component Value Date/Time    Vitamin B12 668 09/04/2018 01:07 PM       No results found for: ANA, Phillip Heal, XBANA    Lab Results   Component Value Date/Time    Cholesterol, total 181 04/13/2020 11:14 AM    HDL Cholesterol 69 04/13/2020 11:14 AM    LDL, calculated 100 04/13/2020 11:14 AM    VLDL, calculated 12 04/13/2020 11:14 AM    Triglyceride 60 04/13/2020 11:14 AM    CHOL/HDL Ratio 2.6 04/13/2020 11:14 AM         CT Results (recent):  Results from Hospital Encounter encounter on 09/12/19    CT ABD PELV WO CONT    Narrative  CLINICAL HISTORY: Constipation  INDICATION: Constipation  COMPARISON: 07/28/2012  CONTRAST:  None.    TECHNIQUE:  Thin axial images were obtained through the abdomen and pelvis. Coronal and  sagittal reformats were generated. Oral contrast was not administered. CT dose  reduction was achieved through use of a standardized protocol tailored for this  examination and automatic exposure control for dose modulation.    The absence of intravenous contrast material reduces the sensitivity for  evaluation of visceral organs and vasculature including presence of small mass  lesions, hemodynamically significant stenoses, dissections, mucosal  abnormalities etc.    FINDINGS:  LOWER THORAX: Cardiomegaly and moderate hiatal hernia.  LIVER/GALLBLADDER: Scattered hepatic hypodensities are consistent with simple  cysts Gallbladder is within normal limits. CBD is not dilated.  SPLEEN/PANCREAS:  within normal limits.  ADRENALS/KIDNEYS: Right and left renal cysts No calculus or hydronephrosis.  STOMACH:  Moderate hiatal hernia  SMALL BOWEL/COLON: Fecal stasis is extensive. Colonic diverticulosis.No  dilatation or wall thickening.  APPENDIX: Unremarkable.  PERITONEUM:  No ascites or pneumoperitoneum.  RETROPERITONEUM: No lymphadenopathy or aortic aneurysm.  REPRODUCTIVE ORGANS:  URINARY BLADDER: No mass or calculus.  BONES: Extensive multilevel spondylolisthesis. Severe canal stenosis at L4-5.  Chronic right sacral deformity. ADDITIONAL COMMENTS: N/A    Impression  No acute intraperitoneal process is identified.    Incidental and/or nonemergent findings are as described in detail above.      MRI Results (recent):  Results from [facility] encounter on 06/05/16    MRI BRAIN W WO CONT    Narrative  INDICATION:  ADENOMA, HEADACCE    EXAMINATION:  MRI BRAIN, PITUITARY, W/WO CONTRAST    COMPARISON:  CT May 23, 2016    TECHNIQUE:  MR imaging of the brain was performed with particular attention to  the pituitary gland including sagittal T1, axial T1, T2, FLAIR, DWI/ADC; Pre and  post contrast dynamic imaging was performed using 7 mL of gadolinium with  particular attention to the sella turcica.    FINDINGS:    Ventricles:  Prominence of the ventricles, sulci and cisterns related to  underlying atrophy.  Intracranial Hemorrhage:  None.  Brain Parenchyma/Brainstem:  Mild chronic white matter disease in the  supratentorial brain. No acute infarction.  Basal Cisterns:  Normal.  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 This displaces normal pituitary tissue and infundibulum toward the  left. This abuts and slightly deforms the optic chiasm again just right of  midline. Mass extends toward the right cavernous sinus, though without definite  invasion  Flow Voids:  Normal.  Post Contrast:  No abnormal parenchymal or meningeal enhancement.  Additional Comments:   N/A.    Impression  IMPRESSION:    1. 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  2. Mild chronic white matter disease with no acute abnormality      IR Results (recent):  No results found for this or any previous visit.      VAS/US Results (recent):  Results from Hospital Encounter encounter on 04/24/16    DUPLEX LOWER EXT VENOUS RIGHT    Narrative  HISTORY: Right leg edema and pain.    PROCEDURE: Multiple real time duplex and color Doppler interrogation of the  right lower extremity deep venous system were performed.    FINDINGS: The veins are compressible and demonstrate normal phasicity and  augmentation. No filling defects are identified to suggest deep venous  thrombosis. The veins of the groin and thigh and knee and calf were evaluated.    However, there is noted to be a right suprapatellar joint effusion with debris  measuring 6.1 x 1.6 cm.    Impression  IMPRESSION:    No evidence of deep venous thrombosis.    Left suprapatellar joint effusion with debris.      PHYSICAL EXAM:  General:    Alert, cooperative, no distress, appears stated age.     Head:   Normocephalic, without obvious abnormality, atraumatic.  Eyes:   Conjunctivae/corneas clear.  PERRLA  Nose:  Nares normal. No drainage or sinus tenderness.  Throat:    Lips, mucosa, and tongue normal.  No Thrush  Neck:  Supple, symmetrical,  no adenopathy, thyroid: non tender    no carotid bruit and no JVD.  Back:    Symmetric,  No CVA tenderness.  Lungs:  Clear to auscultation bilaterally.  No Wheezing or Rhonchi. No rales.  Chest wall:  No tenderness or deformity. No Accessory muscle use.  Heart:   Regular rate and rhythm,  no murmur, rub or gallop.  Abdomen:   Soft, non-tender. Not distended.  Bowel sounds normal. No masses  Extremities: Extremities normal, atraumatic, No cyanosis.  No edema. No clubbing  Skin:     Texture, turgor normal. No rashes or lesions.  Not  Jaundiced  Lymph nodes: Cervical, supraclavicular normal.  Psych:  Good insight.  Not depressed.  Not anxious or agitated.      NEUROLOGICAL EXAM:  Appearance:  The patient is well developed, well nourished, provides a coherent history and is in no acute distress.   Mental Status: Oriented to time, place and person. Mood and affect appropriate.   Cranial Nerves:   Intact visual fields. Fundi are benign. PERLA, EOM's full, no nystagmus, no ptosis. Facial sensation is normal. Corneal reflexes are intact. Facial movement is symmetric. Hearing is normal bilaterally. Palate is midline with normal sternocleidomastoid and trapezius muscles are normal. Tongue is midline.   Motor:  5/5 strength in upper extremity proximal and distal, 5/5 proximal lower extremity and bilateral foot drop. Normal bulk and tone. No fasciculations.   Reflexes:   Deep tendon reflexes 1+/4 and symmetrical.   Sensory:    Dysesthesia to touch, pinprick and vibration.   Gait:   Unsteady gait.  Ambulates with cane   Tremor:   No tremor noted.   Cerebellar:  No cerebellar signs present.   Neurovascular:  Normal heart sounds and regular rhythm, peripheral pulses intact, and no carotid bruits.     Assesment  1. Polyneuropathy  Continue Lyrica    2. Bilateral foot-drop  AFO    3. Meningioma ([place])  Stable    ___________________________________________________  PLAN: Medication and plan discussed with patient      ICD-10-CM ICD-9-CM    1. Polyneuropathy  G62.9 356.9    2. Bilateral foot-drop  M21.371 736.79     M21.372     3. Meningioma ([facility])  D32.9 225.2      Follow-up and Dispositions    ?? Return in about 6 months (around 10/27/2020).             ___________________________________________________    Attending Physician: Nelwyn Salisbury, MD

## 2020-04-29 NOTE — Progress Notes (Signed)
Room:     Identified pt with two pt identifiers(name and DOB). Reviewed record in preparation for visit and have obtained necessary documentation. All patient medications has been reviewed.    Chief Complaint   Patient presents with   . Follow-up   . Memory Loss     Patient reports no pain, pt has a slow but stable gait, comprehends information. Pt reports that she feels fine overall.     Health Maintenance Due   Topic   . COVID-19 Vaccine (1)   . Shingrix Vaccine Age 54> (1 of 2)   . Medicare Yearly Exam    . Flu Vaccine (1)       Vitals:    04/29/20 1105   BP: 138/64   Pulse: 74   Resp: 18   SpO2: 99%   Weight: 138 lb (62.6 kg)   Height: 5\' 2"  (1.575 m)   PainSc:   0 - No pain       4.Have you been to the ER, urgent care clinic since your last visit?  Hospitalized since your last visit?No    5. Have you seen or consulted any other health care providers outside of the [facility] since your last visit?  Include any pap smears or colon screening. No      Patient is accompanied by adult caretaker I have received verbal consent from Carolyn Stare to discuss any/all medical information while they are present in the room.

## 2020-05-03 ENCOUNTER — Ambulatory Visit: Admit: 2020-05-03 | Discharge: 2020-05-03 | Payer: MEDICARE | Attending: Surgery | Primary: Internal Medicine

## 2020-05-03 ENCOUNTER — Ambulatory Visit: Attending: Surgery | Primary: Family Medicine

## 2020-05-03 DIAGNOSIS — Z171 Estrogen receptor negative status [ER-]: Secondary | ICD-10-CM

## 2020-05-03 DIAGNOSIS — C50412 Malignant neoplasm of upper-outer quadrant of left female breast: Secondary | ICD-10-CM

## 2020-05-03 NOTE — Progress Notes (Signed)
Progress Notes by Luellen Pucker, MD at 05/03/20 1120                Author: Luellen Pucker, MD  Service: --  Author Type: Physician       Filed: 05/03/20 1147  Encounter Date: 05/03/2020  Status: Signed          Editor: Luellen Pucker, MD (Physician)               HISTORY OF PRESENT ILLNESS   Kimberly [name] is a 86 y.o.  female who comes in for follow up by Kimberly Odor, MD for  breast cancer   Follow-up   Associated symptoms include headaches. Pertinent negatives include no chest pain,  no abdominal pain and no shortness of breath.    Breast Problem   Associated  symptoms include headaches. Pertinent negatives include no chest pain, no abdominal pain and no shortness of breath.    Breast Cancer   Associated symptoms include headaches. Pertinent negatives include no chest pain,  no abdominal pain and no shortness of breath.    she went for screening mammogram and was noted to have a density in the UOQ of the left breast.  Subsequently she had an [place] confirming a 2.0 x 1.8 x 1.6 cm in the 0100 position 6 cm from nipple.   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  She had not felt a lump, skin changes, nipple changes or drainage, unexplained weight loss or bone pain.  She had menarche at 3, only pregnancy at  62, menopause at 65 and did not take HRT.  Her MGM, M, D, and sister all died from metastatic breast cancer.   She underwent a left lumpectomy for a T2NxMx IDC 03/10/2018.  She received adjuvant WBRT Briscoe Burns).  She had a bilateral 3D dx mammogram 01/13/2020  was BIRADS 2.        Past Medical History:        Diagnosis  Date         ?  Adverse effect of anesthesia            slow to wake         ?  Anginal pain ([place])       ?  Arthritis       ?  Breast cancer ([place])  03/10/2018           left lumpectomy         ?  Cancer ([place])  01/2018          breast         ?  Chronic pain            neuropathy         ?   Constipation       ?  GERD (gastroesophageal reflux disease)       ?  Hearing loss       ?  High cholesterol       ?  Hypertension       ?  Incontinence       ?  Joint pain       ?  Memory disorder       ?  Muscle pain       ?  Osteoporosis       ?  Ringing in the ears       ?  Snoring           ?  Visual disturbance            Past Surgical History:         Procedure  Laterality  Date          ?  HX BREAST LUMPECTOMY  Left  03/10/2018          LEFT BREAST LUMPECTOMY WITH ULTRASOUND GUIDANCE performed by Luellen Pucker, MD at [facility]          ?  HX HYSTERECTOMY         ?  HX ORTHOPAEDIC              epidural pain mtg - pt unsure          ?  HX OTHER SURGICAL  Right  10/15/2016          ligament correction right foot          ?  HX OTHER SURGICAL    11/26/2016          Pituitary tumor removal          Family History         Problem  Relation  Age of Onset          ?  Breast Cancer  Mother  39     ?  No Known Problems  Father       ?  Breast Cancer  Sister  84     ?  Breast Cancer  Daughter  29     ?  Dementia  Brother            ?  Dementia  Sister            Social History          Tobacco Use         ?  Smoking status:  Never Smoker     ?  Smokeless tobacco:  Never Used       Vaping Use         ?  Vaping Use:  Never used       Substance Use Topics         ?  Alcohol use:  No         ?  Drug use:  No          Current Outpatient Medications        Medication  Sig         ?  verapamil ER (CALAN-SR) 180 mg CR tablet  Take 1 Tablet by mouth nightly.     ?  lisinopriL (PriniviL) 10 mg tablet  Take 1 Tablet by mouth daily.     ?  docusate sodium (COLACE) 100 mg capsule  Take 1 Capsule by mouth two (2) times a day for 90 days.     ?  ezetimibe (ZETIA) 10 mg tablet  Take 1 Tablet by mouth daily.     ?  cilostazoL (PLETAL) 100 mg tablet  TAKE 1 TABLET BY MOUTH TWICE DAILY     ?  oxybutynin (DITROPAN) 5 mg tablet  TAKE 1 TABLET BY MOUTH TWICE DAILY     ?  pregabalin (LYRICA) 100 mg capsule  TAKE 1 CAPSULE BY  MOUTH TWICE DAILY     ?  folic acid (FOLVITE) 1 mg tablet  Take 1 Tab by mouth daily.     ?  omeprazole (PRILOSEC) 40 mg capsule  TAKE 1 CAPSULE BY MOUTH EVERY DAY     ?  famotidine (PEPCID) 20 mg tablet  Take 1 Tab by mouth two (2) times a day.     ?  promethazine (PHENERGAN) 12.5 mg tablet  Take 1 Tab by mouth every six (6) hours as needed for Nausea.     ?  traMADol (ULTRAM) 50 mg tablet  every eight (8) hours as needed. 1/2 tablet     ?  alendronate (FOSAMAX) 70 mg tablet  TAKE 1 TABLET BY MOUTH EVERY 7 DAYS     ?  acetaminophen (TYLENOL) 500 mg tablet  Take 1,000 mg by mouth every six (6) hours as needed for Pain.     ?  estradiol (VAGIFEM) 10 mcg tab vaginal tablet  INSERT 1 T VAGINALLY TWICE PER WEEK     ?  isosorbide dinitrate (ISORDIL) 10 mg tablet  TAKE 1 TABLET BY MOUTH TWICE DAILY     ?  polyethylene glycol (MIRALAX) 17 gram/dose powder  TAKE 1 CAPFUL PO ONCE A DAY         ?  aspirin 81 mg chewable tablet  Take 81 mg by mouth daily.          No current facility-administered medications for this visit.          Allergies        Allergen  Reactions         ?  Pcn [Penicillins]  Hives and Myalgia         ?  Sulfa (Sulfonamide Antibiotics)  Unknown (comments)              Review of Systems    Constitutional: Positive for malaise/fatigue. Negative for chills, diaphoresis, fever and weight loss.    HENT: Positive for hearing loss. Negative for sore throat.     Eyes: Negative for blurred vision and discharge.         Vision loss    Respiratory: Negative for cough, shortness of breath and wheezing.     Cardiovascular: Positive for leg swelling. Negative for chest pain, palpitations, orthopnea and claudication.    Gastrointestinal: Positive for nausea. Negative for abdominal pain, constipation, diarrhea, heartburn, melena and vomiting.    Genitourinary: Positive for dysuria. Negative for flank pain, frequency and hematuria.    Musculoskeletal: Positive for joint pain and myalgias . Negative for back pain and neck  pain.    Skin: Negative for rash.    Neurological: Positive for headaches. Negative for dizziness, speech change, focal weakness, seizures, loss of consciousness and weakness.     Endo/Heme/Allergies: Does not bruise/bleed easily.    Psychiatric/Behavioral: Negative for depression and memory loss.       Visit Vitals      BP  128/70 (BP 1 Location: Left upper arm, BP Patient Position: Sitting, BP Cuff Size: Adult)     Pulse  79     Temp  97.3 ??F (36.3 ??C) (Temporal)     Resp  20     Ht  5\' 2"  (1.575 m)     Wt  61.4 kg (135 lb 6.4 oz)     SpO2  96%        BMI  24.76 kg/m??           Physical Exam   Exam conducted with a chaperone present.   Constitutional:        General: She is not in acute distress.     Appearance: She is well-developed. She is not diaphoretic.    HENT:  Head: Normocephalic and atraumatic.      Mouth/Throat:      Pharynx: No oropharyngeal exudate.    Eyes:       General: No scleral icterus.     Conjunctiva/sclera: Conjunctivae normal.      Pupils: Pupils are equal, round, and reactive to light.   Neck:       Thyroid: No thyromegaly.      Vascular: No JVD.      Trachea: No tracheal deviation.    Cardiovascular:       Rate and Rhythm: Normal rate and regular rhythm.      Heart sounds: No murmur heard.   No friction rub. No gallop.     Pulmonary:       Effort: Pulmonary effort is normal. No respiratory distress.      Breath sounds: Normal breath sounds. No wheezing or rales.   Chest:       Breasts: Breasts are symmetrical.          Right: Normal. No inverted nipple, mass, nipple discharge, skin change or tenderness.         Left: Mass  (induration at lumpectomy site) and skin change  (radiation changes ) present. No inverted nipple, nipple discharge or tenderness.         Abdominal :      General: Bowel sounds are normal. There is no distension.      Palpations: Abdomen is soft. There is no mass.      Tenderness: There is no abdominal tenderness. There is no guarding or rebound.      Musculoskeletal:          General: Normal range of motion.      Cervical back: Normal range of motion and neck supple.     Lymphadenopathy:       Cervical: No cervical adenopathy.      Upper Body:      Right upper body: No supraclavicular adenopathy.      Left upper body: No supraclavicular adenopathy.   Skin:      General: Skin is warm and dry.      Coloration: Skin is not pale.      Findings: No erythema or rash.    Neurological:       Mental Status: She is alert and oriented to person, place, and time.      Cranial Nerves: No cranial nerve deficit.    Psychiatric:         Behavior: Behavior normal.         Thought Content: Thought content normal.         Judgment: Judgment normal.                ASSESSMENT and PLAN   1.  Left breast cancer early stage, Clinically stage 2A (T2N0M0). Triple negative Dx 01/2018.   NED at 4 years and 3 months   2.  Strong family hx breast cancer      3.  Allergy to PCN with hives and upset stomach.  Ok for Ancef   4.  Essential hypertension.  On rx   5.  COVID-19.  Got first dose of vaccine and got dehydrated and constipated   6.  Social.   Here with with aide today   Lives with niece and has an aide that comes to the house      Bilateral 3D dx mammogram 01/2021 if health is still good and she desires      RTC  6 months         Luellen Pucker, MD FACS

## 2020-05-03 NOTE — Progress Notes (Signed)
Identified pt with two pt identifiers(name and DOB). Reviewed record in preparation for visit and have obtained necessary documentation. All patient medications has been reviewed.    Chief Complaint   Patient presents with   . Follow-up     Malignant neoplasm of upper-outer quadrant of left breast        Health Maintenance Due   Topic   . COVID-19 Vaccine (1)   . Shingrix Vaccine Age 84> (1 of 2)   . Medicare Yearly Exam    . Flu Vaccine (1)       Vitals:    05/03/20 1130   BP: 128/70   Pulse: 79   Resp: 20   Temp: 97.3 F (36.3 C)   TempSrc: Temporal   SpO2: 96%   Weight: 61.4 kg (135 lb 6.4 oz)   Height: 5\' 2"  (1.575 m)   PainSc:   0 - No pain       4.Have you been to the ER, urgent care clinic since your last visit?  Hospitalized since your last visit?No    5. Have you seen or consulted any other health care providers outside of the [facility] since your last visit?  Include any pap smears or colon screening. No      Patient is accompanied by self I have received verbal consent from Carolyn Stare to discuss any/all medical information while they are present in the room.

## 2020-06-13 MED ORDER — CILOSTAZOL 100 MG TAB
100 mg | ORAL_TABLET | ORAL | 0 refills | Status: DC
Start: 2020-06-13 — End: 2020-09-09

## 2020-07-04 MED ORDER — DOK 100 MG CAPSULE
100 mg | ORAL_CAPSULE | ORAL | 1 refills | Status: DC
Start: 2020-07-04 — End: 2020-07-12

## 2020-07-10 ENCOUNTER — Emergency Department: Admit: 2020-07-11 | Payer: MEDICARE | Primary: Internal Medicine

## 2020-07-10 DIAGNOSIS — D352 Benign neoplasm of pituitary gland: Secondary | ICD-10-CM

## 2020-07-10 NOTE — Procedures (Signed)
[facility]  [facility]    Name:  Kimberly [name], Kimberly [name]  MR#:  [id number]  DOB:  Apr 27, 1924  ACCOUNT #:  [id number]  DATE OF SERVICE:  07/11/2020    CLINICAL INDICATION:  The patient is a 86 year old female with a history of headaches.  The patient with possible pituitary tumor.  EEG to rule out cortical abnormality, rule out encephalopathy, rule out structural brain lesion, rule out seizures.    EEG CLASSIFICATION:  On this patient is, dysrhythmia grade II, left hemisphere.    DESCRIPTION OF THE RECORD:  The background rhythm of this recording is a 9-10 posteriorly located occipital alpha rhythm that attenuates with eye opening.  The predominant abnormality seen was a slightly increased in some 2-7 Hz activity in left hemisphere maximum at the temporoparietal area.  At times, this may resemble of breach type rhythm.  No clear spike or spike-and-wave discharges were seen.  There did not appear to be any clinical change in the patient described during this time.  Hyperventilation was not performed.  Photic stimulation produced minimal driving response in the posterior head regions.  During the recording, the patient did enter brief states of sleep with K complexes and sleep spindles seen in the central head regions.    INTERPRETATION:  This is a mildly abnormal electroencephalogram due to some slight increase in slow wave activity seen in the left hemisphere maximum temporal area that could suggest an area of cortical disturbance or structural brain lesions seen there.  At times, we found that the amplitude almost looked more like a breach rhythm.  Clinical correlation recommended.      Shaune Pollack, MD      [id number]  D:  07/11/2020 21:37  T:  07/12/2020 1:52  JOB #:  [id number]

## 2020-07-10 NOTE — ED Notes (Signed)
2345-Verbal shift report given to Carlena Hurl, RN by Erskine Speed on PT Randon Goldsmith in FT 8.

## 2020-07-11 ENCOUNTER — Inpatient Hospital Stay: Admit: 2020-07-11 | Payer: MEDICARE | Primary: Internal Medicine

## 2020-07-11 ENCOUNTER — Inpatient Hospital Stay
Admit: 2020-07-11 | Discharge: 2020-07-12 | Disposition: A | Discharge status: Home / Self Care | Payer: MEDICARE | Attending: Internal Medicine | Admitting: Internal Medicine

## 2020-07-11 LAB — BASIC METABOLIC PANEL
Anion Gap: 5 mmol/L (ref 5–15)
BUN: 16 MG/DL (ref 6–20)
Bun/Cre Ratio: 19 (ref 12–20)
CO2: 27 mmol/L (ref 21–32)
Calcium: 9.3 MG/DL (ref 8.5–10.1)
Chloride: 111 mmol/L — ABNORMAL HIGH (ref 97–108)
Creatinine: 0.86 MG/DL (ref 0.55–1.02)
EGFR IF NonAfrican American: >60 mL/min/{1.73_m2} (ref >60)
GFR African American: >60 mL/min/{1.73_m2} (ref >60)
Glucose: 114 mg/dL — ABNORMAL HIGH (ref 65–100)
Potassium: 3.8 mmol/L (ref 3.5–5.1)
Sodium: 143 mmol/L (ref 136–145)

## 2020-07-11 LAB — CBC WITH AUTO DIFFERENTIAL
Basophils %: 1 % (ref 0–1)
Basophils Absolute: 0 10*3/uL (ref 0.0–0.1)
Eosinophils %: 4 % (ref 0–7)
Eosinophils Absolute: 0.2 10*3/uL (ref 0.0–0.4)
Granulocyte Absolute Count: 0 10*3/uL (ref 0.00–0.04)
Hematocrit: 38 % (ref 35.0–47.0)
Hemoglobin: 11.7 g/dL (ref 11.5–16.0)
Immature Granulocytes: 0 % (ref 0.0–0.5)
Lymphocytes %: 18 % (ref 12–49)
Lymphocytes Absolute: 0.8 10*3/uL (ref 0.8–3.5)
MCH: 22.7 PG — ABNORMAL LOW (ref 26.0–34.0)
MCHC: 30.8 g/dL (ref 30.0–36.5)
MCV: 73.6 FL — ABNORMAL LOW (ref 80.0–99.0)
MPV: 10.8 FL (ref 8.9–12.9)
Monocytes %: 13 % (ref 5–13)
Monocytes Absolute: 0.5 10*3/uL (ref 0.0–1.0)
NRBC Absolute: 0 10*3/uL (ref 0.00–0.01)
Neutrophils %: 64 % (ref 32–75)
Neutrophils Absolute: 2.7 10*3/uL (ref 1.8–8.0)
Nucleated RBCs: 0 PER 100 WBC
Platelets: 230 10*3/uL (ref 150–400)
RBC: 5.16 M/uL (ref 3.80–5.20)
RDW: 15.6 % — ABNORMAL HIGH (ref 11.5–14.5)
WBC: 4.2 10*3/uL (ref 3.6–11.0)

## 2020-07-11 LAB — URINALYSIS W/ REFLEX CULTURE
BACTERIA, URINE: NEGATIVE /hpf
Bacteria: NEGATIVE /hpf
Bilirubin, Urine: NEGATIVE
Bilirubin: NEGATIVE
Blood, Urine: NEGATIVE
Blood: NEGATIVE
Glucose, Ur: NEGATIVE mg/dL
Glucose: NEGATIVE mg/dL
Ketone: NEGATIVE mg/dL
Ketones, Urine: NEGATIVE mg/dL
Leukocyte Esterase, Urine: NEGATIVE
Leukocyte Esterase: NEGATIVE
Nitrite, Urine: NEGATIVE
Nitrites: NEGATIVE
Protein, UA: NEGATIVE mg/dL
Protein: NEGATIVE mg/dL
Specific Gravity, UA: 1.015 (ref 1.003–1.030)
Specific gravity: 1.015 (ref 1.003–1.030)
Urobilinogen, UA, POCT: 0.2 EU/dL (ref 0.2–1.0)
Urobilinogen: 0.2 EU/dL (ref 0.2–1.0)
pH (UA): 7 (ref 5.0–8.0)
pH, UA: 7 (ref 5.0–8.0)

## 2020-07-11 LAB — SEDIMENTATION RATE
Sed Rate: 45 mm/hr — ABNORMAL HIGH (ref 0–30)
Sed Rate: 38 mm/hr — ABNORMAL HIGH (ref 0–30)

## 2020-07-11 LAB — CRP, HIGH SENSITIVITY
CRP High Sensitivity: 7.3 mg/L
CRP, High sensitivity: 7.3 mg/L

## 2020-07-11 LAB — IRON AND TIBC
Iron Saturation: 20 % (ref 20–50)
Iron: 59 ug/dL (ref 35–150)
TIBC: 293 ug/dL (ref 250–450)

## 2020-07-11 LAB — RHEUMATOID FACTOR, QT
Rheumatoid Factor: <10 IU/mL (ref <15)
Rheumatoid factor: <10 IU/mL (ref <15)

## 2020-07-11 LAB — CBC WITH AUTOMATED DIFF
ABS. BASOPHILS: 0 10*3/uL (ref 0.0–0.1)
ABS. EOSINOPHILS: 0.2 10*3/uL (ref 0.0–0.4)
ABS. IMM. GRANS.: 0 10*3/uL (ref 0.00–0.04)
ABS. LYMPHOCYTES: 0.8 10*3/uL (ref 0.8–3.5)
ABS. MONOCYTES: 0.5 10*3/uL (ref 0.0–1.0)
ABS. NEUTROPHILS: 2.7 10*3/uL (ref 1.8–8.0)
ABSOLUTE NRBC: 0 10*3/uL (ref 0.00–0.01)
BASOPHILS: 1 % (ref 0–1)
EOSINOPHILS: 4 % (ref 0–7)
HCT: 38 % (ref 35.0–47.0)
HGB: 11.7 g/dL (ref 11.5–16.0)
IMMATURE GRANULOCYTES: 0 % (ref 0.0–0.5)
LYMPHOCYTES: 18 % (ref 12–49)
MCH: 22.7 PG — ABNORMAL LOW (ref 26.0–34.0)
MCHC: 30.8 g/dL (ref 30.0–36.5)
MCV: 73.6 FL — ABNORMAL LOW (ref 80.0–99.0)
MONOCYTES: 13 % (ref 5–13)
MPV: 10.8 FL (ref 8.9–12.9)
NEUTROPHILS: 64 % (ref 32–75)
NRBC: 0 PER 100 WBC
PLATELET: 230 10*3/uL (ref 150–400)
RBC: 5.16 M/uL (ref 3.80–5.20)
RDW: 15.6 % — ABNORMAL HIGH (ref 11.5–14.5)
WBC: 4.2 10*3/uL (ref 3.6–11.0)

## 2020-07-11 LAB — METABOLIC PANEL, BASIC
Anion gap: 5 mmol/L (ref 5–15)
BUN/Creatinine ratio: 19 (ref 12–20)
BUN: 16 MG/DL (ref 6–20)
CO2: 27 mmol/L (ref 21–32)
Calcium: 9.3 MG/DL (ref 8.5–10.1)
Chloride: 111 mmol/L — ABNORMAL HIGH (ref 97–108)
Creatinine: 0.86 MG/DL (ref 0.55–1.02)
GFR est AA: >60 mL/min/{1.73_m2} (ref >60)
GFR est non-AA: >60 mL/min/{1.73_m2} (ref >60)
Glucose: 114 mg/dL — ABNORMAL HIGH (ref 65–100)
Potassium: 3.8 mmol/L (ref 3.5–5.1)
Sodium: 143 mmol/L (ref 136–145)

## 2020-07-11 LAB — SED RATE (ESR)
Sed rate, automated: 45 mm/hr — ABNORMAL HIGH (ref 0–30)
Sed rate, automated: 38 mm/hr — ABNORMAL HIGH (ref 0–30)

## 2020-07-11 LAB — IRON PROFILE
Iron % saturation: 20 % (ref 20–50)
Iron: 59 ug/dL (ref 35–150)
TIBC: 293 ug/dL (ref 250–450)

## 2020-07-11 MED ORDER — LISINOPRIL 5 MG TAB
5 mg | Freq: Every day | ORAL | Status: DC
Start: 2020-07-11 — End: 2020-07-11

## 2020-07-11 MED ORDER — PREDNISONE 20 MG TAB
20 mg | Freq: Every day | ORAL | Status: DC
Start: 2020-07-11 — End: 2020-07-11

## 2020-07-11 MED ORDER — OXYBUTYNIN CHLORIDE 5 MG TAB
5 mg | Freq: Every day | ORAL | Status: DC
Start: 2020-07-11 — End: 2020-07-12
  Administered 2020-07-11 – 2020-07-12 (×2): via ORAL

## 2020-07-11 MED ORDER — PREDNISONE 20 MG TAB
20 mg | ORAL | Status: AC
Start: 2020-07-11 — End: 2020-07-11
  Administered 2020-07-11: 06:00:00 via ORAL

## 2020-07-11 MED ORDER — TETRACAINE HCL (PF) 0.5 % EYE DROPS
0.5 % | OPHTHALMIC | Status: AC
Start: 2020-07-11 — End: 2020-07-10
  Administered 2020-07-11: 03:00:00 via OPHTHALMIC

## 2020-07-11 MED ORDER — ACETAMINOPHEN 500 MG TAB
500 mg | Freq: Once | ORAL | Status: AC
Start: 2020-07-11 — End: 2020-07-10
  Administered 2020-07-11: 03:00:00 via ORAL

## 2020-07-11 MED ORDER — PREGABALIN 75 MG CAP
75 mg | Freq: Two times a day (BID) | ORAL | Status: DC
Start: 2020-07-11 — End: 2020-07-12
  Administered 2020-07-11 – 2020-07-12 (×3): via ORAL

## 2020-07-11 MED ORDER — VERAPAMIL ER 180 MG TAB
180 mg | Freq: Every evening | ORAL | Status: DC
Start: 2020-07-11 — End: 2020-07-12
  Administered 2020-07-12: 04:00:00 via ORAL

## 2020-07-11 MED ORDER — ONDANSETRON (PF) 4 MG/2 ML INJECTION
4 mg/2 mL | Freq: Four times a day (QID) | INTRAMUSCULAR | Status: DC | PRN
Start: 2020-07-11 — End: 2020-07-12

## 2020-07-11 MED ORDER — ENOXAPARIN 40 MG/0.4 ML SUB-Q SYRINGE
40 mg/0.4 mL | Freq: Every day | SUBCUTANEOUS | Status: DC
Start: 2020-07-11 — End: 2020-07-12
  Administered 2020-07-11 – 2020-07-12 (×2): via SUBCUTANEOUS

## 2020-07-11 MED ORDER — FAMOTIDINE 20 MG TAB
20 mg | Freq: Two times a day (BID) | ORAL | Status: DC
Start: 2020-07-11 — End: 2020-07-11

## 2020-07-11 MED ORDER — ONDANSETRON 4 MG TAB, RAPID DISSOLVE
4 mg | Freq: Three times a day (TID) | ORAL | Status: DC | PRN
Start: 2020-07-11 — End: 2020-07-12

## 2020-07-11 MED ORDER — POLYETHYLENE GLYCOL 3350 17 GRAM (100 %) ORAL POWDER PACKET
17 gram | Freq: Every day | ORAL | Status: DC | PRN
Start: 2020-07-11 — End: 2020-07-12

## 2020-07-11 MED ORDER — SODIUM CHLORIDE 0.9 % IJ SYRG
Freq: Three times a day (TID) | INTRAMUSCULAR | Status: DC
Start: 2020-07-11 — End: 2020-07-12
  Administered 2020-07-11 – 2020-07-12 (×3): via INTRAVENOUS

## 2020-07-11 MED ORDER — FOLIC ACID 1 MG TAB
1 mg | Freq: Every day | ORAL | Status: DC
Start: 2020-07-11 — End: 2020-07-12
  Administered 2020-07-11 – 2020-07-12 (×2): via ORAL

## 2020-07-11 MED ORDER — ACETAMINOPHEN 650 MG RECTAL SUPPOSITORY
650 mg | Freq: Four times a day (QID) | RECTAL | Status: DC | PRN
Start: 2020-07-11 — End: 2020-07-12

## 2020-07-11 MED ORDER — ISOSORBIDE DINITRATE 20 MG TAB
20 mg | Freq: Two times a day (BID) | ORAL | Status: DC
Start: 2020-07-11 — End: 2020-07-11
  Administered 2020-07-11 (×2): via ORAL

## 2020-07-11 MED ORDER — SODIUM CHLORIDE 0.9 % IJ SYRG
INTRAMUSCULAR | Status: DC | PRN
Start: 2020-07-11 — End: 2020-07-12

## 2020-07-11 MED ORDER — ACETAMINOPHEN 325 MG TABLET
325 mg | Freq: Four times a day (QID) | ORAL | Status: DC | PRN
Start: 2020-07-11 — End: 2020-07-12
  Administered 2020-07-11 (×2): via ORAL

## 2020-07-11 MED ORDER — PREDNISONE 20 MG TAB
20 mg | Freq: Every day | ORAL | Status: DC
Start: 2020-07-11 — End: 2020-07-11
  Administered 2020-07-11: 14:00:00 via ORAL

## 2020-07-11 MED ORDER — CILOSTAZOL 100 MG TAB
100 mg | Freq: Two times a day (BID) | ORAL | Status: DC
Start: 2020-07-11 — End: 2020-07-12
  Administered 2020-07-11 – 2020-07-12 (×3): via ORAL

## 2020-07-11 MED ORDER — EZETIMIBE 10 MG TAB
10 mg | Freq: Every day | ORAL | Status: DC
Start: 2020-07-11 — End: 2020-07-12
  Administered 2020-07-11 – 2020-07-12 (×2): via ORAL

## 2020-07-11 MED ORDER — ASPIRIN 81 MG CHEWABLE TAB
81 mg | Freq: Every day | ORAL | Status: DC
Start: 2020-07-11 — End: 2020-07-12
  Administered 2020-07-11 – 2020-07-12 (×2): via ORAL

## 2020-07-11 MED ORDER — FLUORESCEIN 1 MG EYE STRIPS
1 mg | OPHTHALMIC | Status: AC
Start: 2020-07-11 — End: 2020-07-10
  Administered 2020-07-11: 03:00:00 via OPHTHALMIC

## 2020-07-11 MED FILL — OXYBUTYNIN CHLORIDE 5 MG TAB: 5 mg | ORAL | Qty: 1

## 2020-07-11 MED FILL — MAPAP (ACETAMINOPHEN) 325 MG TABLET: 325 mg | ORAL | Qty: 2

## 2020-07-11 MED FILL — ISOSORBIDE DINITRATE 10 MG TAB: 10 mg | ORAL | Qty: 1

## 2020-07-11 MED FILL — FOLIC ACID 1 MG TAB: 1 mg | ORAL | Qty: 1

## 2020-07-11 MED FILL — ISOSORBIDE DINITRATE 20 MG TAB: 20 mg | ORAL | Qty: 1

## 2020-07-11 MED FILL — PREDNISONE 20 MG TAB: 20 mg | ORAL | Qty: 3

## 2020-07-11 MED FILL — EZETIMIBE 10 MG TAB: 10 mg | ORAL | Qty: 1

## 2020-07-11 MED FILL — BD POSIFLUSH NORMAL SALINE 0.9 % INJECTION SYRINGE: INTRAMUSCULAR | Qty: 40

## 2020-07-11 MED FILL — CILOSTAZOL 100 MG TAB: 100 mg | ORAL | Qty: 1

## 2020-07-11 MED FILL — VERAPAMIL ER 180 MG TAB: 180 mg | ORAL | Qty: 1

## 2020-07-11 MED FILL — ASPIRIN 81 MG CHEWABLE TAB: 81 mg | ORAL | Qty: 1

## 2020-07-11 MED FILL — ENOXAPARIN 40 MG/0.4 ML SUB-Q SYRINGE: 40 mg/0.4 mL | SUBCUTANEOUS | Qty: 0.4

## 2020-07-11 MED FILL — TETRACAINE HCL (PF) 0.5 % EYE DROPS: 0.5 % | OPHTHALMIC | Qty: 4

## 2020-07-11 MED FILL — FUL-GLO 1 MG EYE STRIPS: 1 mg | OPHTHALMIC | Qty: 1

## 2020-07-11 MED FILL — ACETAMINOPHEN 500 MG TAB: 500 mg | ORAL | Qty: 2

## 2020-07-11 MED FILL — LYRICA 25 MG CAPSULE: 25 mg | ORAL | Qty: 1

## 2020-07-11 NOTE — H&P (Signed)
H&P by Carron Brazen, MD at  07/11/20 0205                Author: Carron Brazen, MD  Service: Internal Medicine  Author Type: Physician       Filed: 07/11/20 0343  Date of Service: 07/11/20 0205  Status: Signed          Editor: Carron Brazen, MD (Physician)                                  Hospitalist Admission Note      NAME: Kimberly [name]    DOB:  02-26-1924    MRN:  [id number]       Date/Time:  07/11/2020 2:05 AM      Patient PCP: Hilbert Odor, MD   _____________________________________________________________________   Given the patient's current clinical presentation, I have a high level of concern for decompensation if discharged from the emergency department.  Complex decision making was performed, which includes  reviewing the patient's available past medical records, laboratory results, and x-ray films.         My assessment of this patient's clinical condition and my plan of care is as follows.      Assessment / Plan:     Right-sided headache and eye pain   Per patient she doesn't have pain currently. Denies visual complaint other than pain. Denies tenderness to the right temporal area.   Sed rate is 45   CT head showed no acute intracranial pathology   Lab MCV 73.6   ED physician spoke with neurologist on-call Dr. Danton Sewer who recommended to admit to the hospitalist service and continue with prednisone and follow neurological examination to assess for giant  cell arteritis in the morning and repeat neurological examination in the morning and consult in-house neurologist, will continue prednisone empirically for now and will follow for neurology in house  in the morning, currently patient is pain-free. We'll  continue Tylenol as needed            Other chronic medical problems   Left breast cancer    patient to follow-up with her oncologist as scheduled as an outpatient    Essential hypertension.     Patient is vaccinated for Covid      Resume other home medication            Code Status:  Full code   Next of kin   Marlou Porch  Other Relative  [phone]              DVT Prophylaxis: Subcu Lovenox   GI Prophylaxis: not indicated      Baseline: Lives with niece and has an aide that comes to the house              Subjective:     CHIEF COMPLAINT:          ?  Eye Pain               eye pain on right today with it running. no visual change. denies injury; she has retinal pigmatosis         History Provided By: Patient       HPI: Kimberly [name] is a 86 year old history of ocular shingles, cataracts status post removal presenting with right-sided headache pain  and eye pain.  Reports eye pain started on the right today with increased tear production.  Pain is severe.  While in the waiting room she developed a temporal headache which is also severe.  She denies any blurry vision double vision.  She denies  any unilateral numbness or weakness.  She denies dizziness syncope or fall.  No recent trauma to her head.  No jaw claudication, myalgias, fever.       There are no other complaints, changes, or physical findings at this time.       PCP: Hilbert Odor, MD                We were asked to admit for work up and evaluation of the above problems.       Vital Signs-Reviewed the patient's vital signs.   Patient Vitals for the past 12 hrs:             Temp  Pulse  Resp  BP  SpO2            07/11/20 0159  97.9 ??F (36.6 ??C)  79  16  (!) 208/107  95 %            07/10/20 1823  98.2 ??F (36.8 ??C)  81  16  (!) 198/91  98 %              ED Course as of 07/11/20 0245   Sun Jul 10, 2020   2145   Will obtain ESR to risk stratify for temporal arteritis in setting of eye pain and headache [WB]   2145   I have a low to moderate suspicion for temporal arteritis, however, will evaluate given age and no similar history of symptoms [WB]   2145   Will perform fluorescein exam to evaluate for abnormal uptake, evidence of ocular shingles, which patient has a history of [WB]   2146   Normal, reactive pupils [WB]    2221   She has a nonfocal neurologic exam. [WB]   2224   However she is 86 years old, and even in the absence of head trauma with ongoing severe headache will obtain noncontrasted head CT to evaluate for ICH.  Nonfocal neurologic exam do not believe angiography  is indicated  [WB]   2314   Patient with significantly improved pain following tetracaine drops.  There is no abnormal uptake of fluorescein, no dendritic pattern to make me concerned for ocular shingles.  [WB]   9741   There is no rash noted to the face including no Hutchinson sign. [WB]   2330   Patient does have elevated ESR at 45, typically these are under 50. Will discuss with inpatient neurology for guidance [WB]   2335   CT demonstrates no acute process [WB]   2339   Will obtain baseline lab work [WB]   Mon Jul 11, 2020   0057   CBC without leukocytosis normal hemoglobin normal platelet [WB]   0106   Receiving steroids at this time, will repeat blood pressure [WB]   0115   I spoke with Danton Sewer of inpatient neurology who recommends admission, repeat examination in the morning and initiation of steroids given her ongoing headache pain . [WB]               Past Medical History:        Diagnosis  Date         ?  Adverse effect of anesthesia            slow to wake         ?  Anginal pain ([place])       ?  Arthritis       ?  Breast cancer ([place])  03/10/2018           left lumpectomy         ?  Cancer ([place])  01/2018          breast         ?  Chronic pain            neuropathy         ?  Constipation       ?  GERD (gastroesophageal reflux disease)       ?  Hearing loss       ?  High cholesterol       ?  Hypertension       ?  Incontinence       ?  Joint pain       ?  Memory disorder       ?  Muscle pain       ?  Osteoporosis       ?  Ringing in the ears       ?  Snoring           ?  Visual disturbance                Past Surgical History:         Procedure  Laterality  Date          ?  HX BREAST LUMPECTOMY  Left  03/10/2018          LEFT BREAST LUMPECTOMY  WITH ULTRASOUND GUIDANCE performed by Luellen Pucker, MD at [facility]          ?  HX HYSTERECTOMY         ?  HX ORTHOPAEDIC              epidural pain mtg - pt unsure          ?  HX OTHER SURGICAL  Right  10/15/2016          ligament correction right foot          ?  HX OTHER SURGICAL    11/26/2016          Pituitary tumor removal             Social History          Tobacco Use         ?  Smoking status:  Never Smoker     ?  Smokeless tobacco:  Never Used       Substance Use Topics         ?  Alcohol use:  No              Family History         Problem  Relation  Age of Onset          ?  Breast Cancer  Mother  59     ?  No Known Problems  Father       ?  Breast Cancer  Sister  29     ?  Breast Cancer  Daughter  18     ?  Dementia  Brother            ?  Dementia  Sister            Allergies  Allergen  Reactions         ?  Pcn [Penicillins]  Hives and Myalgia         ?  Sulfa (Sulfonamide Antibiotics)  Unknown (comments)              Prior to Admission medications             Medication  Sig  Start Date  End Date  Taking?  Authorizing Provider            DOK 100 mg capsule  TAKE 1 CAPSULE BY MOUTH TWICE DAILY  07/04/20      Latham-Solomon, Clarita Leber, MD     cilostazoL (PLETAL) 100 mg tablet  TAKE 1 TABLET BY MOUTH TWICE DAILY  06/13/20      Latham-Solomon, Clarita Leber, MD     verapamil ER (CALAN-SR) 180 mg CR tablet  Take 1 Tablet by mouth nightly.  03/28/20      Latham-Solomon, Clarita Leber, MD     lisinopriL (PriniviL) 10 mg tablet  Take 1 Tablet by mouth daily.  03/28/20      Latham-Solomon, Clarita Leber, MD     ezetimibe (ZETIA) 10 mg tablet  Take 1 Tablet by mouth daily.  03/23/20      Lesli Albee, MD     oxybutynin (DITROPAN) 5 mg tablet  TAKE 1 TABLET BY MOUTH TWICE DAILY  03/10/20      Latham-Solomon, Clarita Leber, MD     pregabalin (LYRICA) 100 mg capsule  TAKE 1 CAPSULE BY MOUTH TWICE DAILY  08/28/19      Aralu, Cletus C, MD     folic acid (FOLVITE) 1 mg tablet  Take 1 Tab by mouth daily.  02/05/19      Aralu,  Cletus C, MD     omeprazole (PRILOSEC) 40 mg capsule  TAKE 1 CAPSULE BY MOUTH EVERY DAY  11/14/18      Latham-Solomon, Clarita Leber, MD     famotidine (PEPCID) 20 mg tablet  Take 1 Tab by mouth two (2) times a day.  10/28/18      Latham-Solomon, Clarita Leber, MD     promethazine (PHENERGAN) 12.5 mg tablet  Take 1 Tab by mouth every six (6) hours as needed for Nausea.  10/28/18      Latham-Solomon, Clarita Leber, MD     traMADol Veatrice Bourbon) 50 mg tablet  every eight (8) hours as needed. 1/2 tablet  09/01/18      Provider, Historical     alendronate (FOSAMAX) 70 mg tablet  TAKE 1 TABLET BY MOUTH EVERY 7 DAYS  06/16/18      Latham-Solomon, Clarita Leber, MD     acetaminophen (TYLENOL) 500 mg tablet  Take 1,000 mg by mouth every six (6) hours as needed for Pain.        Provider, Historical     estradiol (VAGIFEM) 10 mcg tab vaginal tablet  INSERT 1 T VAGINALLY TWICE PER WEEK  07/01/17      Provider, Historical     isosorbide dinitrate (ISORDIL) 10 mg tablet  TAKE 1 TABLET BY MOUTH TWICE DAILY  12/18/16      Latham-Solomon, Clarita Leber, MD     polyethylene glycol (MIRALAX) 17 gram/dose powder  TAKE 1 CAPFUL PO ONCE A DAY  09/15/15      Provider, Historical            aspirin 81 mg chewable tablet  Take 81 mg by mouth daily.  Provider, Historical           REVIEW OF SYSTEMS:      I am not able to complete the review of systems because:        The patient is intubated and sedated       The patient has altered mental status due to his acute medical problems       The patient has baseline aphasia from prior stroke(s)       The patient has baseline dementia and is not reliable historian       The patient is in acute medical distress and unable to provide information                  Eyes: Positive for pain and visual disturbance .    Neurological: Positive for headaches.    All other systems reviewed and are negative.        Objective:     VITALS:     Visit Vitals      BP  (!) 208/107 (BP 1 Location: Left upper arm, BP Patient Position: At rest)     Pulse   79     Temp  97.9 ??F (36.6 ??C)     Resp  16     Ht  5' 3"  (1.6 m)     Wt  61.4 kg (135 lb 5.8 oz)     SpO2  95%        BMI  23.98 kg/m??           PHYSICAL EXAM:      Constitutional:        General: She is not in acute distress.     Appearance: Normal appearance. She is not ill-appearing.   HENT:       Head: Normocephalic.      Mouth/Throat:      Mouth: Mucous membranes are moist.   Eyes:       General: No visual field deficit or scleral icterus.        Right eye: No discharge.         Left eye: No discharge.      Extraocular Movements: Extraocular movements intact.      Conjunctiva/sclera: Conjunctivae  normal.      Pupils: Pupils are equal, round, and reactive to light.   Cardiovascular:       Rate and Rhythm: Normal rate and regular rhythm.      Pulses: Normal pulses.   Pulmonary:       Effort: Pulmonary effort is normal.      Breath sounds: Normal breath sounds.    Abdominal:      General: Abdomen is flat.      Palpations: Abdomen is soft.    Musculoskeletal:          General: No deformity.    Skin:      General: Skin is warm and dry.   Neurological:       Mental Status: She is alert and oriented to person, place, and time. Mental status is at baseline.      GCS: GCS eye subscore is 4 . GCS verbal subscore is 5. GCS motor subscore is 6 .      Cranial Nerves: Cranial nerves are intact. No cranial nerve deficit, dysarthria or facial asymmetry.      Motor: Motor function is intact.      Coordination: Coordination is intact.      Gait: Gait is intact.  Psychiatric:         Behavior: Behavior normal.         Thought Content: Thought content normal.              _______________________________________________________________________   Care Plan discussed with:           Comments         Patient             Family              RN         Care Manager                            Consultant:          _______________________________________________________________________   Expected  Disposition:       Home with Family           HH/PT/OT/RN       SNF/LTC          [facility]       ________________________________________________________________________   TOTAL TIME:    Minutes      Critical Care Provided     Minutes non procedure based              Comments             Reviewed previous records         >50% of visit spent in counseling and coordination of care    Discussion with patient and/or family and questions answered           Given the patient's current clinical presentation, I have a high level of concern for decompensation if discharged from the ED. Complex decision making was  performed which includes reviewing the patient's available past medical records, laboratory results, and Xray films. I have also directly communicated my plan and discussed this case with the involved ED physician.       ____________________________________________________________________   Carron Brazen, MD      Procedures: see electronic medical records for all procedures/Xrays and details which were not copied into this note but were reviewed prior to creation of Plan.      LAB DATA REVIEWED:       Recent Results (from the past 24 hour(s))     SED RATE (ESR)          Collection Time: 07/10/20 10:17 PM         Result  Value  Ref Range            Sed rate, automated  45 (H)  0 - 30 mm/hr       CBC WITH AUTOMATED DIFF          Collection Time: 07/11/20 12:33 AM         Result  Value  Ref Range            WBC  4.2  3.6 - 11.0 K/uL       RBC  5.16  3.80 - 5.20 M/uL       HGB  11.7  11.5 - 16.0 g/dL       HCT  38.0  35.0 - 47.0 %       MCV  73.6 (L)  80.0 - 99.0 FL       MCH  22.7 (L)  26.0 - 34.0 PG       MCHC  30.8  30.0 - 36.5  g/dL       RDW  15.6 (H)  11.5 - 14.5 %       PLATELET  230  150 - 400 K/uL       MPV  10.8  8.9 - 12.9 FL       NRBC  0.0  0 PER 100 WBC       ABSOLUTE NRBC  0.00  0.00 - 0.01 K/uL       NEUTROPHILS  64  32 - 75 %       LYMPHOCYTES  18  12 - 49 %       MONOCYTES  13  5 - 13 %       EOSINOPHILS  4  0 - 7 %       BASOPHILS  1  0 - 1 %        IMMATURE GRANULOCYTES  0  0.0 - 0.5 %       ABS. NEUTROPHILS  2.7  1.8 - 8.0 K/UL       ABS. LYMPHOCYTES  0.8  0.8 - 3.5 K/UL       ABS. MONOCYTES  0.5  0.0 - 1.0 K/UL       ABS. EOSINOPHILS  0.2  0.0 - 0.4 K/UL       ABS. BASOPHILS  0.0  0.0 - 0.1 K/UL       ABS. IMM. GRANS.  0.0  0.00 - 0.04 K/UL       DF  SMEAR SCANNED          RBC COMMENTS  ANISOCYTOSIS   1+             RBC COMMENTS  MICROCYTOSIS   1+             RBC COMMENTS  HYPOCHROMIA   2+             METABOLIC PANEL, BASIC          Collection Time: 07/11/20 12:33 AM         Result  Value  Ref Range            Sodium  143  136 - 145 mmol/L       Potassium  3.8  3.5 - 5.1 mmol/L       Chloride  111 (H)  97 - 108 mmol/L       CO2  27  21 - 32 mmol/L       Anion gap  5  5 - 15 mmol/L       Glucose  114 (H)  65 - 100 mg/dL       BUN  16  6 - 20 MG/DL       Creatinine  0.86  0.55 - 1.02 MG/DL       BUN/Creatinine ratio  19  12 - 20         GFR est AA  >60  >60 ml/min/1.17m       GFR est non-AA  >60  >60 ml/min/1.727m           Calcium  9.3  8.5 - 10.1 MG/DL

## 2020-07-11 NOTE — Progress Notes (Signed)
MRI Pending:    Need completed online [facility] MRI screening form. Sign and fax to [phone].    Call [phone] once faxed.

## 2020-07-11 NOTE — Progress Notes (Signed)
.  End of Shift Note    Bedside shift change report given to  (oncoming nurse) by Rito Ehrlich, RN (offgoing nurse).  Report included the following information SBAR, Intake/Output, MAR and Recent Results    Shift worked:  7 a - 7 p     Shift summary and any significant changes:     Patient given prescribed medications with no concerns. Family at bedside this afternoon.      Concerns for physician to address:  Family is awaiting call from physician      [employer] phone for oncoming shift:   [phone]       Activity:  Activity Level: Up with Assistance  Number times ambulated in hallways past shift: 0  Number of times OOB to chair past shift: 2    Cardiac:   Cardiac Monitoring: No           Access:   Current line(s): PIV     Genitourinary:   Urinary status: voiding    Respiratory:   O2 Device: None (Room air)  Chronic home O2 use?: NO  Incentive spirometer at bedside: NO     GI:     Current diet:  ADULT DIET Regular  Passing flatus: YES  Tolerating current diet: YES       Pain Management:   Patient states pain is manageable on current regimen: YES    Skin:  Braden Score: 17  Interventions: float heels    Patient Safety:  Fall Score: Total Score: 2  Interventions: gripper socks       Length of Stay:  Expected LOS: - - -  Actual LOS: [place], RN

## 2020-07-11 NOTE — ED Provider Notes (Signed)
ED Provider Notes by Lorenda Cahill, MD at 07/11/20 0242                Author: Lorenda Cahill, MD  Service: --  Author Type: Physician       Filed: 07/11/20 0248  Date of Service: 07/11/20 0242  Status: Signed          Editor: Lorenda Cahill, MD (Physician)               EMERGENCY DEPARTMENT HISTORY AND PHYSICAL EXAM           Date: 07/10/2020   [name]   Patient Age and Sex: 86 y.o.  female         History of Presenting Illness          Chief Complaint       Patient presents with        ?  Eye Pain             eye pain on right today with it running. no visual change. denies injury; she has retinal pigmatosis        History Provided By: Patient      HPI: Kimberly [name] is a 86 year old  history of ocular shingles, cataracts status post removal presenting with right-sided headache pain and eye pain.  Reports eye pain started on the right today with increased tear production.  Pain is severe.  While in the waiting room she developed a  temporal headache which is also severe.  She denies any blurry vision double vision.  She denies any unilateral numbness or weakness.  She denies dizziness syncope or fall.  No recent trauma to her head.  No jaw claudication, myalgias, fever.      There are no other complaints, changes, or physical findings at this time.      PCP: Hilbert Odor, MD        No current facility-administered medications on file prior to encounter.          Current Outpatient Medications on File Prior to Encounter          Medication  Sig  Dispense  Refill           ?  DOK 100 mg capsule  TAKE 1 CAPSULE BY MOUTH TWICE DAILY  180 Capsule  1     ?  cilostazoL (PLETAL) 100 mg tablet  TAKE 1 TABLET BY MOUTH TWICE DAILY  180 Tablet  0     ?  verapamil ER (CALAN-SR) 180 mg CR tablet  Take 1 Tablet by mouth nightly.  90 Tablet  1     ?  lisinopriL (PriniviL) 10 mg tablet  Take 1 Tablet by mouth daily.  90 Tablet  1     ?  ezetimibe (ZETIA) 10 mg tablet  Take 1 Tablet by mouth  daily.  90 Tablet  0     ?  oxybutynin (DITROPAN) 5 mg tablet  TAKE 1 TABLET BY MOUTH TWICE DAILY  180 Tablet  1           ?  pregabalin (LYRICA) 100 mg capsule  TAKE 1 CAPSULE BY MOUTH TWICE DAILY  60 Cap  3           ?  folic acid (FOLVITE) 1 mg tablet  Take 1 Tab by mouth daily.  90 Tab  3     ?  omeprazole (PRILOSEC) 40 mg capsule  TAKE 1 CAPSULE BY MOUTH  EVERY DAY  90 Cap  3     ?  famotidine (PEPCID) 20 mg tablet  Take 1 Tab by mouth two (2) times a day.  60 Tab  3     ?  promethazine (PHENERGAN) 12.5 mg tablet  Take 1 Tab by mouth every six (6) hours as needed for Nausea.  30 Tab  0     ?  traMADol (ULTRAM) 50 mg tablet  every eight (8) hours as needed. 1/2 tablet         ?  alendronate (FOSAMAX) 70 mg tablet  TAKE 1 TABLET BY MOUTH EVERY 7 DAYS  4 Tab  5     ?  acetaminophen (TYLENOL) 500 mg tablet  Take 1,000 mg by mouth every six (6) hours as needed for Pain.         ?  estradiol (VAGIFEM) 10 mcg tab vaginal tablet  INSERT 1 T VAGINALLY TWICE PER WEEK    3     ?  isosorbide dinitrate (ISORDIL) 10 mg tablet  TAKE 1 TABLET BY MOUTH TWICE DAILY  180 Tab  0     ?  polyethylene glycol (MIRALAX) 17 gram/dose powder  TAKE 1 CAPFUL PO ONCE A DAY    11           ?  aspirin 81 mg chewable tablet  Take 81 mg by mouth daily.                 Past History        Past Medical History:     Past Medical History:        Diagnosis  Date         ?  Adverse effect of anesthesia            slow to wake         ?  Anginal pain ([place])       ?  Arthritis       ?  Breast cancer ([place])  03/10/2018           left lumpectomy         ?  Cancer ([place])  01/2018          breast         ?  Chronic pain            neuropathy         ?  Constipation       ?  GERD (gastroesophageal reflux disease)       ?  Hearing loss       ?  High cholesterol       ?  Hypertension       ?  Incontinence       ?  Joint pain       ?  Memory disorder       ?  Muscle pain       ?  Osteoporosis       ?  Ringing in the ears       ?  Snoring           ?  Visual  disturbance             Past Surgical History:     Past Surgical History:         Procedure  Laterality  Date          ?  HX BREAST LUMPECTOMY  Left  03/10/2018  LEFT BREAST LUMPECTOMY WITH ULTRASOUND GUIDANCE performed by Luellen Pucker, MD at [facility]          ?  HX HYSTERECTOMY         ?  HX ORTHOPAEDIC              epidural pain mtg - pt unsure          ?  HX OTHER SURGICAL  Right  10/15/2016          ligament correction right foot          ?  HX OTHER SURGICAL    11/26/2016          Pituitary tumor removal           Family History:     Family History         Problem  Relation  Age of Onset          ?  Breast Cancer  Mother  20     ?  No Known Problems  Father       ?  Breast Cancer  Sister  46     ?  Breast Cancer  Daughter  31     ?  Dementia  Brother            ?  Dementia  Sister             Social History:     Social History          Tobacco Use         ?  Smoking status:  Never Smoker     ?  Smokeless tobacco:  Never Used       Vaping Use         ?  Vaping Use:  Never used       Substance Use Topics         ?  Alcohol use:  No         ?  Drug use:  No           Allergies:     Allergies        Allergen  Reactions         ?  Pcn [Penicillins]  Hives and Myalgia         ?  Sulfa (Sulfonamide Antibiotics)  Unknown (comments)             Review of Systems     Review of Systems    Eyes: Positive for pain and visual disturbance .    Neurological: Positive for headaches.    All other systems reviewed and are negative.            Physical Exam     Physical Exam   Vitals and nursing note reviewed.   Constitutional:        General: She is not in acute distress.     Appearance: Normal appearance. She is not ill-appearing.    HENT:       Head: Normocephalic.      Mouth/Throat:      Mouth: Mucous membranes are moist.    Eyes:       General: No visual field deficit or scleral icterus.        Right eye: No discharge.         Left eye: No discharge.      Extraocular Movements: Extraocular movements  intact.      Conjunctiva/sclera: Conjunctivae normal.  Pupils:  Pupils are equal, round, and reactive to light.   Cardiovascular:       Rate and Rhythm: Normal rate and regular rhythm.      Pulses: Normal pulses.    Pulmonary:       Effort: Pulmonary effort is normal.      Breath sounds: Normal breath sounds.   Abdominal :      General: Abdomen is flat.      Palpations: Abdomen is soft.     Musculoskeletal:          General: No deformity.    Skin:      General: Skin is warm and dry.   Neurological :       Mental Status: She is alert and oriented to person, place, and time. Mental status is at baseline.      GCS: GCS eye subscore is 4 . GCS verbal subscore is 5. GCS motor subscore is 6 .      Cranial Nerves: Cranial nerves are intact. No cranial nerve deficit, dysarthria or facial asymmetry.      Motor: Motor function is intact.      Coordination: Coordination is intact.      Gait: Gait is intact.   Psychiatric:          Behavior: Behavior normal.         Thought Content: Thought content normal.                Diagnostic Study Results        Labs     Recent Results (from the past 12 hour(s))     SED RATE (ESR)          Collection Time: 07/10/20 10:17 PM         Result  Value  Ref Range            Sed rate, automated  45 (H)  0 - 30 mm/hr       CBC WITH AUTOMATED DIFF          Collection Time: 07/11/20 12:33 AM         Result  Value  Ref Range            WBC  4.2  3.6 - 11.0 K/uL       RBC  5.16  3.80 - 5.20 M/uL       HGB  11.7  11.5 - 16.0 g/dL       HCT  38.0  35.0 - 47.0 %       MCV  73.6 (L)  80.0 - 99.0 FL       MCH  22.7 (L)  26.0 - 34.0 PG       MCHC  30.8  30.0 - 36.5 g/dL       RDW  15.6 (H)  11.5 - 14.5 %       PLATELET  230  150 - 400 K/uL       MPV  10.8  8.9 - 12.9 FL       NRBC  0.0  0 PER 100 WBC       ABSOLUTE NRBC  0.00  0.00 - 0.01 K/uL       NEUTROPHILS  64  32 - 75 %       LYMPHOCYTES  18  12 - 49 %       MONOCYTES  13  5 - 13 %       EOSINOPHILS  4  0 - 7 %       BASOPHILS  1  0 - 1 %        IMMATURE GRANULOCYTES  0  0.0 - 0.5 %       ABS. NEUTROPHILS  2.7  1.8 - 8.0 K/UL       ABS. LYMPHOCYTES  0.8  0.8 - 3.5 K/UL       ABS. MONOCYTES  0.5  0.0 - 1.0 K/UL       ABS. EOSINOPHILS  0.2  0.0 - 0.4 K/UL       ABS. BASOPHILS  0.0  0.0 - 0.1 K/UL       ABS. IMM. GRANS.  0.0  0.00 - 0.04 K/UL       DF  SMEAR SCANNED          RBC COMMENTS  ANISOCYTOSIS   1+                  RBC COMMENTS  MICROCYTOSIS   1+                  RBC COMMENTS  HYPOCHROMIA   2+             METABOLIC PANEL, BASIC          Collection Time: 07/11/20 12:33 AM         Result  Value  Ref Range            Sodium  143  136 - 145 mmol/L       Potassium  3.8  3.5 - 5.1 mmol/L       Chloride  111 (H)  97 - 108 mmol/L       CO2  27  21 - 32 mmol/L       Anion gap  5  5 - 15 mmol/L       Glucose  114 (H)  65 - 100 mg/dL       BUN  16  6 - 20 MG/DL       Creatinine  0.86  0.55 - 1.02 MG/DL       BUN/Creatinine ratio  19  12 - 20         GFR est AA  >60  >60 ml/min/1.87m       GFR est non-AA  >60  >60 ml/min/1.712m           Calcium  9.3  8.5 - 10.1 MG/DL           Radiologic Studies -      CT HEAD WO CONT       Final Result     No acute findings.                           CT Results   (Last 48 hours)                                    07/10/20 2252    CT HEAD WO CONT  Final result            Impression:    No acute findings.                                     Narrative:  EXAM: CT HEAD WO CONT             INDICATION: headache             COMPARISON: May 23, 2017.             CONTRAST: None.             TECHNIQUE: Unenhanced CT of the head was performed using 5 mm images. Brain and      bone windows were generated. Coronal and sagittal reformats. CT dose reduction      was achieved through use of a standardized protocol tailored for this      examination and automatic exposure control for dose modulation.               FINDINGS:      The ventricles and sulci are enlarged. There are calcifications in the left      frontal lobe which are stable.  There is no significant white matter disease.      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 The basilar cisterns are open. No CT evidence of      acute infarct.             The bone windows demonstrate no abnormalities. The visualized portions of the      paranasal sinuses and mastoid air cells are clear.                                 CXR Results   (Last 48 hours)          None                       Medical Decision Making     I am the first provider for this patient.      I reviewed the vital signs, available nursing notes, past medical history, past surgical history, family history and social history.      Vital Signs-Reviewed the patient's vital signs.   Patient Vitals for the past 12 hrs:            Temp  Pulse  Resp  BP  SpO2            07/11/20 0159  97.9 ??F (36.6 ??C)  79  16  (!) 208/107  95 %            07/10/20 1823  98.2 ??F (36.8 ??C)  81  16  (!) 198/91  98 %           Records Reviewed: Nursing Notes and Old Medical Records      Provider Notes (Medical Decision Making):    Differential includes corneal abrasion, ocular shingles, giant cell arteritis, ICH      Forcing exam performed demonstrates no abnormal uptake, no dendritic pattern, normal exam.  CT head performed demonstrates no acute process.  Will obtain ESR to risk stratify for giant cell arteritis.      ED Course:    Initial assessment performed. The patients presenting problems have been discussed, and they are in agreement with the care plan formulated and outlined with them.  I have encouraged them to ask questions as they arise throughout their visit.        ED Course as of 07/11/20 0245       Sun Jul 10, 2020        2145  Will obtain ESR to risk stratify  for temporal arteritis in setting of eye pain and headache [WB]     2145  I have a low to moderate suspicion for temporal arteritis, however, will evaluate given age and no similar history of symptoms  [WB]     2145  Will perform fluorescein exam to evaluate for abnormal uptake, evidence of ocular  shingles, which patient has a history of  [WB]     2146  Normal, reactive pupils [WB]     2221  She has a nonfocal neurologic exam. [WB]     2224  However she is 86 years old, and even in the absence of head trauma with ongoing severe headache will obtain noncontrasted head CT to evaluate for ICH.   Nonfocal neurologic exam do not believe angiography is indicated  [WB]        2314  Patient with significantly improved pain following tetracaine drops.  There is no abnormal uptake of fluorescein, no dendritic pattern to make me concerned  for ocular shingles. [WB]        3818  There is no rash noted to the face including no Hutchinson sign. [WB]     2330  Patient does have elevated ESR at 45, typically these are under 50. Will discuss with inpatient neurology for guidance  [WB]     2335  CT demonstrates no acute process [WB]     2339  Will obtain baseline lab work [WB]       Mon Jul 11, 2020        0057  CBC without leukocytosis normal hemoglobin normal platelet [WB]     0106  Receiving steroids at this time, will repeat blood pressure [WB]     0115  I spoke with Danton Sewer of inpatient neurology who recommends admission, repeat examination in the morning and initiation of steroids given her ongoing  headache pain. [WB]              ED Course User Index   [WB] Lorenda Cahill, MD        Disposition:   Admission Note:   Patient is being admitted to the hospital by Dr. Tommas Olp, Service: Hospitalist.  The results of their tests and reasons for their admission have been discussed with them and available family. They convey  agreement and understanding for the need to be admitted and for their admission diagnosis.            Diagnosis        Clinical Impression:       1.  Acute intractable headache, unspecified headache type            Attestations:      Lorenda Cahill, M.D.              Please note that this dictation was completed with Dragon, the computer voice recognition software.  Quite often unanticipated grammatical,  syntax, homophones, and other interpretive errors are inadvertently transcribed by the computer software.  Please  disregard these errors.  Please excuse any errors that have escaped final proofreading.  Thank you.

## 2020-07-11 NOTE — Progress Notes (Signed)
EEG completed

## 2020-07-11 NOTE — Consults (Signed)
Consult  REFERRED BY:  Hilbert Odor, MD    CHIEF COMPLAINT: Right-sided headache and eye pain and borderline sed rate      Subjective:     Kimberly [name] is a 86 y.o. right-handed African-American female we are seeing as a new patient for evaluation of history of right-sided headache that began yesterday in the morning, radiated to her right eye, and lasted all day, and she came to the emergency room resume some pain medications seem to get better and now she says the headache is been gone ever since last night.  She had a sed rate of 45 on admission and the possibility of temporal arteritis was raised and she was maintained on prednisone 60 mg a day.  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  This apparently was done at [place] or [facility] I believe with Dr. Candiss Norse though she says it was done at [facility].  She had a transsphenoidal hypophysectomy.  She not had any imaging done of the brain like an MRI I can see in the chart but her CT scan does show some persistent tumor apparently.  Her EEG also shows some slowing in the left hemisphere that she did not have before, and this might just be a postop change could suggest some other structural lesion in the left hemisphere.  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  She has no temporal artery tenderness and I doubt whether the sed rate significant so we will stop the steroids now.  She has no other new focal weakness, sensory loss, visual changes, or other focal neurological symptoms.  She has no fever, meningismus, or history of head trauma precipitating causes for this headache.  She actually says she feels  back to normal now.  Patient normally ambulates with a rolling walker due to slightly unsteady gait of her age.  Past Medical History:   Diagnosis Date   ??? Adverse effect of anesthesia     slow to wake   ??? Anginal pain ([place])    ??? Arthritis    ??? Breast cancer ([place]) 03/10/2018     left lumpectomy   ??? Cancer ([place]) 01/2018    breast   ??? Chronic pain     neuropathy   ??? Constipation    ??? GERD (gastroesophageal reflux disease)    ??? Hearing loss    ??? High cholesterol    ??? Hypertension    ??? Incontinence    ??? Joint pain    ??? Memory disorder    ??? Muscle pain    ??? Osteoporosis    ??? Ringing in the ears    ??? Snoring    ??? Visual disturbance       Past Surgical History:   Procedure Laterality Date   ??? HX BREAST LUMPECTOMY Left 03/10/2018    LEFT BREAST LUMPECTOMY WITH ULTRASOUND GUIDANCE performed by Luellen Pucker, MD at [facility]   ??? HX HYSTERECTOMY     ??? HX ORTHOPAEDIC      epidural pain mtg - pt unsure   ??? HX OTHER SURGICAL Right 10/15/2016  ligament correction right foot   ??? HX OTHER SURGICAL  11/26/2016    Pituitary tumor removal     Family History   Problem Relation Age of Onset   ??? Breast Cancer Mother 29   ??? No Known Problems Father    ??? Breast Cancer Sister 54   ??? Breast Cancer Daughter 41   ??? Dementia Brother    ??? Dementia Sister       Social History     Tobacco Use   ??? Smoking status: Never Smoker   ??? Smokeless tobacco: Never Used   Substance Use Topics   ??? Alcohol use: No         Current Facility-Administered Medications:   ???  aspirin chewable tablet 81 mg, 81 mg, Oral, DAILY, Din, Nizam U, MD, 81 mg at 07/11/20 4401  ???  cilostazoL (PLETAL) tablet 100 mg, 100 mg, Oral, BID, Din, Nizam U, MD, 100 mg at 07/11/20 0900  ???  ezetimibe (ZETIA) tablet 10 mg, 10 mg, Oral, DAILY, Din, Nizam U, MD, 10 mg at 02/72/53 6644  ???  folic acid (FOLVITE) tablet 1 mg, 1 mg, Oral, DAILY, Din, Nizam U, MD, 1 mg at 07/11/20 0347  ???  isosorbide dinitrate (ISORDIL) tablet 10 mg, 10 mg, Oral, BID, Din, Nizam U, MD, 10 mg at  07/11/20 0839  ???  oxybutynin (DITROPAN) tablet 5 mg, 5 mg, Oral, DAILY, Din, Lyndee Hensen, MD, 5 mg at 07/11/20 4259  ???  pregabalin (LYRICA) capsule 100 mg, 100 mg, Oral, BID, Din, Lyndee Hensen, MD, 100 mg at 07/11/20 5638  ???  verapamil ER (CALAN-SR) tablet 180 mg, 180 mg, Oral, QHS, Din, Nizam U, MD  ???  sodium chloride (NS) flush 5-40 mL, 5-40 mL, IntraVENous, Q8H, Din, Nizam U, MD  ???  sodium chloride (NS) flush 5-40 mL, 5-40 mL, IntraVENous, PRN, Din, Lyndee Hensen, MD  ???  acetaminophen (TYLENOL) tablet 650 mg, 650 mg, Oral, Q6H PRN, 650 mg at 07/11/20 0839 **OR** acetaminophen (TYLENOL) suppository 650 mg, 650 mg, Rectal, Q6H PRN, Din, Lyndee Hensen, MD  ???  polyethylene glycol (MIRALAX) packet 17 g, 17 g, Oral, DAILY PRN, Din, Nizam U, MD  ???  ondansetron (ZOFRAN ODT) tablet 4 mg, 4 mg, Oral, Q8H PRN **OR** ondansetron (ZOFRAN) injection 4 mg, 4 mg, IntraVENous, Q6H PRN, Din, Lyndee Hensen, MD  ???  enoxaparin (LOVENOX) injection 40 mg, 40 mg, SubCUTAneous, DAILY, Din, Lyndee Hensen, MD, 40 mg at 07/11/20 0844        Allergies   Allergen Reactions   ??? Pcn [Penicillins] Hives and Myalgia   ??? Sulfa (Sulfonamide Antibiotics) Unknown (comments)      MRI Results (most recent):  Results from Hospital Encounter encounter on 06/05/16    MRI BRAIN W WO CONT    Narrative  INDICATION:  ADENOMA, HEADACCE    EXAMINATION:  MRI BRAIN, PITUITARY, W/WO CONTRAST    COMPARISON:  CT May 23, 2016    TECHNIQUE:  MR imaging of the brain was performed with particular attention to  the pituitary gland including sagittal T1, axial T1, T2, FLAIR, DWI/ADC; Pre and  post contrast dynamic imaging was performed using 7 mL of gadolinium with  particular attention to the sella turcica.    FINDINGS:    Ventricles:  Prominence of the ventricles, sulci and cisterns related to  underlying atrophy.  Intracranial Hemorrhage:  None.  Brain Parenchyma/Brainstem:  Mild chronic white matter disease in the  supratentorial brain. No acute infarction.  Basal  Cisterns:  Normal.  Pituitary  Gland:  There is a large enhancing pituitary macroadenoma, extending  to the suprasellar cistern which measures 2.0 x 1.8 x 1.5 cm, slightly right of  midline. This displaces normal pituitary tissue and infundibulum toward the  left. This abuts and slightly deforms the optic chiasm again just right of  midline. Mass extends toward the right cavernous sinus, though without definite  invasion  Flow Voids:  Normal.  Post Contrast:  No abnormal parenchymal or meningeal enhancement.  Additional Comments:  N/A.    Impression  IMPRESSION:    1. 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  2. Mild chronic white matter disease with no acute abnormality      Results from Hospital Encounter encounter on 06/05/16    MRI BRAIN W WO CONT    Narrative  INDICATION:  ADENOMA, HEADACCE    EXAMINATION:  MRI BRAIN, PITUITARY, W/WO CONTRAST    COMPARISON:  CT May 23, 2016    TECHNIQUE:  MR imaging of the brain was performed with particular attention to  the pituitary gland including sagittal T1, axial T1, T2, FLAIR, DWI/ADC; Pre and  post contrast dynamic imaging was performed using 7 mL of gadolinium with  particular attention to the sella turcica.    FINDINGS:    Ventricles:  Prominence of the ventricles, sulci and cisterns related to  underlying atrophy.  Intracranial Hemorrhage:  None.  Brain Parenchyma/Brainstem:  Mild chronic white matter disease in the  supratentorial brain. No acute infarction.  Basal Cisterns:  Normal.  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 This displaces normal pituitary tissue and infundibulum toward the  left. This abuts and slightly deforms the optic chiasm again just right of  midline. Mass extends toward the right cavernous sinus, though without definite  invasion  Flow Voids:   Normal.  Post Contrast:  No abnormal parenchymal or meningeal enhancement.  Additional Comments:  N/A.    Impression  IMPRESSION:    1. 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  2. Mild chronic white matter disease with no acute abnormality    Review of Systems:  A comprehensive review of systems was negative except for: Constitutional: positive for fatigue and malaise  Eyes: positive for Right eye pain  Musculoskeletal: positive for myalgias, arthralgias and stiff joints  Neurological: positive for headaches, memory problems, coordination problems and gait problems  Behvioral/Psych: positive for Memory loss, anxiety and depression   Vitals:    07/11/20 0507 07/11/20 0734 07/11/20 0806 07/11/20 1452   BP: (!) 189/103 (!) 189/102 (!) 169/96 (!) 145/67   Pulse: 82 90 89 76   Resp:  18 18 16    Temp:  97.5 ??F (36.4 ??C) 98.2 ??F (36.8 ??C) 98 ??F (36.7 ??C)   SpO2:  93% 99% 98%   Weight:       Height:         Objective:     I      NEUROLOGICAL EXAM:    Appearance:  The patient is poorly developed and nourished, provides a poor history and is in no acute distress.   Mental Status: Oriented to place and person, and the president, knows the month  and the year, but not the day of the month, and has mild abnormal cognitive function and speech is fluent and no aphasia or dysarthria. Mood and affect appropriate.   Cranial Nerves:   Intact visual fields. Fundi are benign, disc are flat, no lesions seen on funduscopy. PERLA, EOM's full, no nystagmus, no ptosis. Facial sensation is normal. Corneal reflexes are not tested. Facial movement is symmetric. Hearing is abnnormal bilaterally. Palate is midline with normal sternocleidomastoid and trapezius muscles are normal. Tongue is midline.  Neck without meningismus or bruits  Temporal arteries are not tender or enlarged  TMJ areas are not tender on palpation   Motor:  4/5 strength in upper and  lower proximal and distal muscles. Normal bulk and tone. No fasciculations.  Rapid alternating movement is symmetric and slow bilaterally   Reflexes:   Deep tendon reflexes 2+/4 and symmetrical.  No babinski or clonus present   Sensory:   Normal to touch, pinprick and vibration and temperature is decreased in both feet.  DSS is intact   Gait:  Abnormal gait as patient is mildly unsteady and has to use a rolling walker.   Tremor:   No tremor noted.   Cerebellar:   Mildly abnormal cerebellar signs present on Romberg and tandem testing and finger-nose-finger exam is unremarkable.   Neurovascular:  Normal heart sounds and regular rhythm, peripheral pulses decreased, and no carotid bruits.           Assessment:       ICD-10-CM ICD-9-CM    1. Acute intractable headache, unspecified headache type  R51.9 784.0      Active Problems:    Syncope and collapse (07/18/2016)      Headache (07/11/2020)      Tension vascular headache (07/11/2020)      Pituitary macroadenoma with extrasellar extension ([place]) (07/11/2020)      History of surgical removal of pituitary gland ([place]) (07/11/2020)      Bilateral carotid artery stenosis (07/11/2020)      Disturbance of memory (07/11/2020)      Acute alteration in mental status (07/11/2020)      Cerebral microvascular disease (07/11/2020)        Plan:     Patient with longstanding history of headaches for about 3 years, and she is followed by Dr Verdia Kuba her neurologist at [facility], and has no headache now, some stopping the steroids because I do not think her headaches are coming from temporal arteritis.  She has a history of the pituitary tumor needs a follow-up scan, because apparently she been lost to follow-up with that.  I think she has seen Dr. Oletta Darter for treatment of that, but she is a little confused and thinks she might have been done at [facility].  We will check metabolic parameters and repeat her sed rate looking for causes of headache, and she does have a slightly abnormal EEG in the left  hemisphere which is new, and it might reflect partly her previous pituitary surgery.  However we need to rule out any structural lesion there.  Difficult case, discussed with the patient in detail and she is agreeable with plans of therapy as above.  If all else fails she might need a temporal artery biopsy  Continue excellent medical care as you are, we will follow carefully with you    Signed By: Shaune Pollack, MD     July 11, 2020       CC: Hilbert Odor, MD  FAX: [phone]

## 2020-07-11 NOTE — ED Notes (Signed)
 Patient is being transferred to [facility], Room # 1112.  Report given to Tica, RN on [name] for routine progression of care.  Report consisted of the following information SBAR, Kardex, ED Summary, Intake/Output, MAR, Recent Results and Med Rec Status.  Patient transferred to receiving unit by: transport (RN or tech name).    Outstanding consults needed: Yes Neurology to see in AM    Next labs due: No     The following personal items will be sent with the patient during transfer to the floor:    All valuables:    Cardiac monitoring ordered: No     The following CURRENT information was reported to the receiving RN:    Code status: Full Code at time of transfer    Last set of vital signs:  Vital Signs  Level of Consciousness: Alert (0) (07/11/20 0734)  Temp: 97.5 F (36.4 C) (07/11/20 0734)  Temp Source: Oral (07/11/20 0734)  Pulse (Heart Rate): 90 (07/11/20 0734)  Resp Rate: 18 (07/11/20 0734)  BP: (!) 189/102 (07/11/20 0734)  MAP (Calculated): 131 (07/11/20 0734)  BP 1 Location: Left upper arm (07/11/20 0159)  BP 1 Method: Automatic (07/11/20 0159)  BP Patient Position: At rest (07/11/20 0159)  MEWS Score: 1 (07/11/20 0734)         Oxygen Therapy  O2 Sat (%): 93 % (07/11/20 0734)  O2 Device: None (Room air) (07/11/20 0734)      Last pain assessment:  Pain 1  Pain Scale 1: Numeric (0 - 10)  Pain Intensity 1: 4      Wounds: No     Urinary catheter: voiding  Is there a foley order: No     LDAs:       Peripheral IV 07/11/20 Right Antecubital (Active)   Site Assessment Clean, dry, & intact 07/11/20 0035   Phlebitis Assessment 0 07/11/20 0035   Infiltration Assessment 0 07/11/20 0035   Dressing Status Clean, dry, & intact 07/11/20 0035   Dressing Type Transparent; Tape 07/11/20 0035   Hub Color/Line Status Patent; Flushed; [facility] 07/11/20 0035   Action Taken Blood drawn 07/11/20 0035   Alcohol Cap Used No 07/11/20 0035         Opportunity for questions and clarification was provided.    Franky Public,  RN

## 2020-07-12 LAB — BASIC METABOLIC PANEL
Anion Gap: 6 mmol/L (ref 5–15)
BUN: 23 MG/DL — ABNORMAL HIGH (ref 6–20)
Bun/Cre Ratio: 25 — ABNORMAL HIGH (ref 12–20)
CO2: 24 mmol/L (ref 21–32)
Calcium: 8.8 MG/DL (ref 8.5–10.1)
Chloride: 112 mmol/L — ABNORMAL HIGH (ref 97–108)
Creatinine: 0.91 MG/DL (ref 0.55–1.02)
EGFR IF NonAfrican American: 58 mL/min/{1.73_m2} — ABNORMAL LOW (ref >60)
GFR African American: >60 mL/min/{1.73_m2} (ref >60)
Glucose: 127 mg/dL — ABNORMAL HIGH (ref 65–100)
Potassium: 3.8 mmol/L (ref 3.5–5.1)
Sodium: 142 mmol/L (ref 136–145)

## 2020-07-12 LAB — VAS DUP CAROTID BILATERAL
Left CCA dist EDV: 9.8 cm/s
Left CCA dist PSV: 52.7 cm/s
Left CCA prox EDV: 14.4 cm/s
Left CCA prox PSV: 90.3 cm/s
Left ECA EDV: 0 cm/s
Left ECA PSV: 93 cm/s
Left ICA dist EDV: 20.8 cm/s
Left ICA dist PSV: 84.5 cm/s
Left ICA mid EDV: 13.9 cm/s
Left ICA mid PSV: 45.3 cm/s
Left ICA prox EDV: 10.4 cm/s
Left ICA prox PSV: 43.5 cm/s
Left ICA/CCA PSV: 1.61
Left subclavian prox EDV: 0 cm/s
Left subclavian prox PSV: 108.2 cm/s
Left vertebral EDV: 11.25 cm/s
Left vertebral PSV: 43.5 cm/s
Right CCA dist EDV: 9.8 cm/s
Right CCA prox EDV: 10.9 cm/s
Right CCA prox PSV: 60.3 cm/s
Right ECA EDV: 0 cm/s
Right ECA PSV: 96.9 cm/s
Right ICA dist EDV: 17.5 cm/s
Right ICA dist PSV: 63.6 cm/s
Right ICA mid EDV: 13.1 cm/s
Right ICA mid PSV: 61.4 cm/s
Right ICA prox EDV: 13.1 cm/s
Right ICA prox PSV: 49.3 cm/s
Right ICA/CCA PSV: 1
Right cca dist PSV: 64.7 cm/s
Right subclavian prox EDV: 0 cm/s
Right subclavian prox PSV: 176.3 cm/s
Right vertebral EDV: 0 cm/s
Right vertebral PSV: 43.9 cm/s

## 2020-07-12 LAB — CBC WITH AUTO DIFFERENTIAL
Basophils %: 0 % (ref 0–1)
Basophils Absolute: 0 10*3/uL (ref 0.0–0.1)
Eosinophils %: 0 % (ref 0–7)
Eosinophils Absolute: 0 10*3/uL (ref 0.0–0.4)
Granulocyte Absolute Count: 0.1 10*3/uL — ABNORMAL HIGH (ref 0.00–0.04)
Hematocrit: 31.6 % — ABNORMAL LOW (ref 35.0–47.0)
Hemoglobin: 10.1 g/dL — ABNORMAL LOW (ref 11.5–16.0)
Immature Granulocytes: 1 % — ABNORMAL HIGH (ref 0.0–0.5)
Lymphocytes %: 8 % — ABNORMAL LOW (ref 12–49)
Lymphocytes Absolute: 0.7 10*3/uL — ABNORMAL LOW (ref 0.8–3.5)
MCH: 22.6 PG — ABNORMAL LOW (ref 26.0–34.0)
MCHC: 32 g/dL (ref 30.0–36.5)
MCV: 70.9 FL — ABNORMAL LOW (ref 80.0–99.0)
MPV: 10.9 FL (ref 8.9–12.9)
Monocytes %: 10 % (ref 5–13)
Monocytes Absolute: 0.9 10*3/uL (ref 0.0–1.0)
NRBC Absolute: 0 10*3/uL (ref 0.00–0.01)
Neutrophils %: 81 % — ABNORMAL HIGH (ref 32–75)
Neutrophils Absolute: 7 10*3/uL (ref 1.8–8.0)
Nucleated RBCs: 0 PER 100 WBC
Platelets: 221 10*3/uL (ref 150–400)
RBC: 4.46 M/uL (ref 3.80–5.20)
RDW: 15.2 % — ABNORMAL HIGH (ref 11.5–14.5)
WBC: 8.7 10*3/uL (ref 3.6–11.0)

## 2020-07-12 LAB — C-REACTIVE PROTEIN: CRP: 0.9 mg/dL — ABNORMAL HIGH (ref 0.00–0.60)

## 2020-07-12 LAB — TSH 3RD GENERATION
TSH: 0.82 u[IU]/mL (ref 0.36–3.74)
TSH: 0.82 u[IU]/mL (ref 0.36–3.74)

## 2020-07-12 LAB — MAGNESIUM
Magnesium: 2.3 mg/dL (ref 1.6–2.4)
Magnesium: 2.3 mg/dL (ref 1.6–2.4)

## 2020-07-12 LAB — T4, FREE
T4 Free: 1 NG/DL (ref 0.8–1.5)
T4, Free: 1 NG/DL (ref 0.8–1.5)

## 2020-07-12 LAB — ANA BY MULTIPLEX FLOW IA, QL
ANA, Direct: NEGATIVE
ANA: NEGATIVE

## 2020-07-12 LAB — CORTISOL, AM
Cortisol - AM: 7.8 ug/dL (ref 4.30–22.45)
Cortisol, a.m.: 7.8 ug/dL (ref 4.30–22.45)

## 2020-07-12 LAB — HOMOCYSTEINE, PLASMA
HOMOCYSTEINE, PLASMA: 7.4 umol/L (ref 3.7–13.9)
Homocysteine, plasma: 7.4 umol/L (ref 3.7–13.9)

## 2020-07-12 LAB — DUPLEX CAROTID BILATERAL
LEFT EXTERNAL CAROTID ARTERY D: 0 cm/s
LEFT VERTEBRAL ARTERY D: 11.25 cm/s
Left CCA dist dias: 9.8 cm/s
Left CCA dist sys: 52.7 cm/s
Left CCA prox dias: 14.4 cm/s
Left CCA prox sys: 90.3 cm/s
Left ECA sys: 93 cm/s
Left ICA dist dias: 20.8 cm/s
Left ICA dist sys: 84.5 cm/s
Left ICA mid dias: 13.9 cm/s
Left ICA mid sys: 45.3 cm/s
Left ICA prox dias: 10.4 cm/s
Left ICA prox sys: 43.5 cm/s
Left ICA/CCA sys: 1.61
Left subclavian prox EDV: 0 cm/s
Left subclavian prox PSV: 108.2 cm/s
Left vertebral sys: 43.5 cm/s
RIGHT EXTERNAL CAROTID ARTERY D: 0 cm/s
RIGHT VERTEBRAL ARTERY D: 0 cm/s
Right CCA dist dias: 9.8 cm/s
Right CCA prox dias: 10.9 cm/s
Right CCA prox sys: 60.3 cm/s
Right ICA dist dias: 17.5 cm/s
Right ICA dist sys: 63.6 cm/s
Right ICA mid dias: 13.1 cm/s
Right ICA mid sys: 61.4 cm/s
Right ICA prox dias: 13.1 cm/s
Right ICA prox sys: 49.3 cm/s
Right ICA/CCA sys: 1
Right cca dist sys: 64.7 cm/s
Right eca sys: 96.9 cm/s
Right subclavian prox EDV: 0 cm/s
Right subclavian prox PSV: 176.3 cm/s
Right vertebral sys: 43.9 cm/s

## 2020-07-12 LAB — METABOLIC PANEL, BASIC
Anion gap: 6 mmol/L (ref 5–15)
BUN/Creatinine ratio: 25 — ABNORMAL HIGH (ref 12–20)
BUN: 23 MG/DL — ABNORMAL HIGH (ref 6–20)
CO2: 24 mmol/L (ref 21–32)
Calcium: 8.8 MG/DL (ref 8.5–10.1)
Chloride: 112 mmol/L — ABNORMAL HIGH (ref 97–108)
Creatinine: 0.91 MG/DL (ref 0.55–1.02)
GFR est AA: >60 mL/min/{1.73_m2} (ref >60)
GFR est non-AA: 58 mL/min/{1.73_m2} — ABNORMAL LOW (ref >60)
Glucose: 127 mg/dL — ABNORMAL HIGH (ref 65–100)
Potassium: 3.8 mmol/L (ref 3.5–5.1)
Sodium: 142 mmol/L (ref 136–145)

## 2020-07-12 LAB — CBC WITH AUTOMATED DIFF
ABS. BASOPHILS: 0 10*3/uL (ref 0.0–0.1)
ABS. EOSINOPHILS: 0 10*3/uL (ref 0.0–0.4)
ABS. IMM. GRANS.: 0.1 10*3/uL — ABNORMAL HIGH (ref 0.00–0.04)
ABS. LYMPHOCYTES: 0.7 10*3/uL — ABNORMAL LOW (ref 0.8–3.5)
ABS. MONOCYTES: 0.9 10*3/uL (ref 0.0–1.0)
ABS. NEUTROPHILS: 7 10*3/uL (ref 1.8–8.0)
ABSOLUTE NRBC: 0 10*3/uL (ref 0.00–0.01)
BASOPHILS: 0 % (ref 0–1)
EOSINOPHILS: 0 % (ref 0–7)
HCT: 31.6 % — ABNORMAL LOW (ref 35.0–47.0)
HGB: 10.1 g/dL — ABNORMAL LOW (ref 11.5–16.0)
IMMATURE GRANULOCYTES: 1 % — ABNORMAL HIGH (ref 0.0–0.5)
LYMPHOCYTES: 8 % — ABNORMAL LOW (ref 12–49)
MCH: 22.6 PG — ABNORMAL LOW (ref 26.0–34.0)
MCHC: 32 g/dL (ref 30.0–36.5)
MCV: 70.9 FL — ABNORMAL LOW (ref 80.0–99.0)
MONOCYTES: 10 % (ref 5–13)
MPV: 10.9 FL (ref 8.9–12.9)
NEUTROPHILS: 81 % — ABNORMAL HIGH (ref 32–75)
NRBC: 0 PER 100 WBC
PLATELET: 221 10*3/uL (ref 150–400)
RBC: 4.46 M/uL (ref 3.80–5.20)
RDW: 15.2 % — ABNORMAL HIGH (ref 11.5–14.5)
WBC: 8.7 10*3/uL (ref 3.6–11.0)

## 2020-07-12 LAB — C REACTIVE PROTEIN, QT: C-Reactive protein: 0.9 mg/dL — ABNORMAL HIGH (ref 0.00–0.60)

## 2020-07-12 MED ORDER — GADOTERIDOL 279.3 MG/ML INTRAVENOUS SOLUTION
279.3 mg/mL | Freq: Once | INTRAVENOUS | Status: AC
Start: 2020-07-12 — End: 2020-07-11
  Administered 2020-07-12: 01:00:00 via INTRAVENOUS

## 2020-07-12 MED FILL — OXYBUTYNIN CHLORIDE 5 MG TAB: 5 mg | ORAL | Qty: 1

## 2020-07-12 MED FILL — ASPIRIN 81 MG CHEWABLE TAB: 81 mg | ORAL | Qty: 1

## 2020-07-12 MED FILL — CILOSTAZOL 100 MG TAB: 100 mg | ORAL | Qty: 1

## 2020-07-12 MED FILL — VERAPAMIL ER 180 MG TAB: 180 mg | ORAL | Qty: 1

## 2020-07-12 MED FILL — EZETIMIBE 10 MG TAB: 10 mg | ORAL | Qty: 1

## 2020-07-12 MED FILL — ENOXAPARIN 40 MG/0.4 ML SUB-Q SYRINGE: 40 mg/0.4 mL | SUBCUTANEOUS | Qty: 0.4

## 2020-07-12 MED FILL — FOLIC ACID 1 MG TAB: 1 mg | ORAL | Qty: 1

## 2020-07-12 MED FILL — LYRICA 25 MG CAPSULE: 25 mg | ORAL | Qty: 1

## 2020-07-12 NOTE — Progress Notes (Signed)
I have reviewed discharge instructions with the patient and guardian.  The patient and guardian verbalized understanding.  Discharge medications reviewed with patient and guardian and appropriate educational materials and side effects teaching were provided.  Follow-up appointments reviewed with patient.  Opportunity for questions or concerns given.  Venous access removed without difficulty, pt tolerated well.  Patients belongings gathered and sent with patient.  Patient is ready for discharge.        No new prescriptions, wheelchaired patient to daughter's car.  Pt walked to front seat without incident.

## 2020-07-12 NOTE — Consults (Signed)
Consult    Patient: Kimberly [name] MRN: [id number]  SSN: [id number]    Date of Birth: June 17, 1924  Age: 86 y.o.  Sex: female      Subjective:      Kimberly [name] is a 86 y.o. 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 Pt presented to the ED with a HA which pt notes she has a hx of chronic HAs, but this was more severe and was temporally located. She was seen by Dr. Danton Sewer who was concerned about the possibility of Giant Cell Arteriis so started her on high doses of prednisone,. She received Prednisone 60mg  at 1AM and 8AM. Pt notes that the HAs have resolved and she is not currently having any HA issues.    Pt has hx of a HAs and is followed by Dr. Frann Rider of Neurology. Per pt and her daughter's report as well as by chart review, she was found to have a pituitary mass by MRI in 2017. Dr. Verdia Kuba referred her to Dr. Maryagnes Amos of neurosurgery, who took her for resection in April 2018. Per pt's daughter she had 2-3 follow ups with Dr. Candiss Norse and then he "discharged her back to Dr. Verdia Kuba".  Dr. Verdia Kuba has been following her ever since for the pituitary adenoma as well as her HAs.    Pt denies issues of CP, SOB, N/V, abdominal pain, diarrhea, constipation, syncope or pre-syncope.  She denies issues of FOV loss/change and she reports that she was recently seen by [facility] who told her her vision was looking good they did not see any changes or problems.    In the hospital pt had an MRI of her brain, which showed the pituitary mass was smaller in size 1.3x1.2cm.      Past Medical History:   Diagnosis Date   ??? Adverse effect of anesthesia     slow to wake   ??? Anginal pain ([place])    ??? Arthritis    ??? Breast cancer ([place]) 03/10/2018     left lumpectomy   ??? Cancer ([place]) 01/2018    breast   ??? Chronic pain     neuropathy   ??? Constipation    ??? GERD (gastroesophageal reflux disease)    ??? Hearing loss    ??? High cholesterol    ??? Hypertension    ??? Incontinence    ??? Joint pain    ??? Memory disorder     ??? Muscle pain    ??? Osteoporosis    ??? Ringing in the ears    ??? Snoring    ??? Visual disturbance      Past Surgical History:   Procedure Laterality Date   ??? HX BREAST LUMPECTOMY Left 03/10/2018    LEFT BREAST LUMPECTOMY WITH ULTRASOUND GUIDANCE performed by Luellen Pucker, MD at [facility]   ??? HX HYSTERECTOMY     ??? HX ORTHOPAEDIC      epidural pain mtg - pt unsure   ??? HX OTHER SURGICAL Right 10/15/2016    ligament correction right foot   ??? HX OTHER SURGICAL  11/26/2016    Pituitary tumor removal      Family History   Problem Relation Age of Onset   ??? Breast Cancer Mother 61   ??? No Known Problems Father    ??? Breast Cancer Sister 45   ??? Breast Cancer Daughter 60   ??? Dementia Brother    ??? Dementia Sister  Social History     Tobacco Use   ??? Smoking status: Never Smoker   ??? Smokeless tobacco: Never Used   Substance Use Topics   ??? Alcohol use: No      Current Facility-Administered Medications   Medication Dose Route Frequency Provider Last Rate Last Admin   ??? aspirin chewable tablet 81 mg  81 mg Oral DAILY Carron Brazen, MD   81 mg at 07/12/20 9604   ??? cilostazoL (PLETAL) tablet 100 mg  100 mg Oral BID Carron Brazen, MD   100 mg at 07/12/20 5409   ??? ezetimibe (ZETIA) tablet 10 mg  10 mg Oral DAILY Carron Brazen, MD   10 mg at 07/12/20 8119   ??? folic acid (FOLVITE) tablet 1 mg  1 mg Oral DAILY Din, Lyndee Hensen, MD   1 mg at 07/12/20 0917   ??? oxybutynin (DITROPAN) tablet 5 mg  5 mg Oral DAILY Din, Lyndee Hensen, MD   5 mg at 07/12/20 1478   ??? pregabalin (LYRICA) capsule 100 mg  100 mg Oral BID Carron Brazen, MD   100 mg at 07/12/20 2956   ??? verapamil ER (CALAN-SR) tablet 180 mg  180 mg Oral QHS Carron Brazen, MD   180 mg at 07/11/20 2242   ??? sodium chloride (NS) flush 5-40 mL  5-40 mL IntraVENous Q8H Din, Nizam U, MD   10 mL at 07/12/20 2130   ??? sodium chloride (NS) flush 5-40 mL  5-40 mL IntraVENous PRN Din, Lyndee Hensen, MD       ??? acetaminophen (TYLENOL) tablet 650 mg  650 mg Oral Q6H PRN Carron Brazen, MD   650 mg at 07/11/20  1625    Or   ??? acetaminophen (TYLENOL) suppository 650 mg  650 mg Rectal Q6H PRN Din, Lyndee Hensen, MD       ??? polyethylene glycol (MIRALAX) packet 17 g  17 g Oral DAILY PRN Din, Lyndee Hensen, MD       ??? ondansetron (ZOFRAN ODT) tablet 4 mg  4 mg Oral Q8H PRN Din, Lyndee Hensen, MD        Or   ??? ondansetron (ZOFRAN) injection 4 mg  4 mg IntraVENous Q6H PRN Din, Lyndee Hensen, MD       ??? enoxaparin (LOVENOX) injection 40 mg  40 mg SubCUTAneous DAILY Din, Lyndee Hensen, MD   40 mg at 07/12/20 8657        Allergies   Allergen Reactions   ??? Pcn [Penicillins] Hives and Myalgia   ??? Sulfa (Sulfonamide Antibiotics) Unknown (comments)       Review of Systems:  A comprehensive review of systems was negative except for that written in the History of Present Illness.    Objective:     Vitals:    07/11/20 1452 07/11/20 2025 07/11/20 2241 07/12/20 0800   BP: (!) 145/67 114/69 113/66 129/63   Pulse: 76 85 87 (!) 53   Resp: 16 17 18 18    Temp: 98 ??F (36.7 ??C) 98.3 ??F (36.8 ??C) 98.1 ??F (36.7 ??C) 97.7 ??F (36.5 ??C)   SpO2: 98% 96% 96% 99%   Weight:       Height:            Physical Exam:  GENERAL: alert, cooperative, no distress, appears stated age  EYE: negative  LYMPHATIC: Cervical, supraclavicular, and axillary nodes normal.   THROAT & NECK: normal and no LAD, no thyromegaly, no surpraclavicular or dorso-cervical fat  padding  LUNG: clear to auscultation bilaterally  HEART: regular rate and rhythm, S1, S2 normal, no murmur, click, rub or gallop  ABDOMEN: soft, non-tender. Bowel sounds normal. No masses,  no organomegaly, no abdominal striae  EXTREMITIES:  extremities normal, atraumatic, no cyanosis or edema  SKIN: Normal.  NEUROLOGIC: negative  PSYCHIATRIC: non focal    Recent Results (from the past 24 hour(s))   METABOLIC PANEL, BASIC    Collection Time: 07/12/20  4:02 AM   Result Value Ref Range    Sodium 142 136 - 145 mmol/L    Potassium 3.8 3.5 - 5.1 mmol/L    Chloride 112 (H) 97 - 108 mmol/L    CO2 24 21 - 32 mmol/L    Anion gap 6 5 - 15 mmol/L    Glucose  127 (H) 65 - 100 mg/dL    BUN 23 (H) 6 - 20 MG/DL    Creatinine 0.91 0.55 - 1.02 MG/DL    BUN/Creatinine ratio 25 (H) 12 - 20      GFR est AA >60 >60 ml/min/1.98m2    GFR est non-AA 58 (L) >60 ml/min/1.51m2    Calcium 8.8 8.5 - 10.1 MG/DL   MAGNESIUM    Collection Time: 07/12/20  4:02 AM   Result Value Ref Range    Magnesium 2.3 1.6 - 2.4 mg/dL   CBC WITH AUTOMATED DIFF    Collection Time: 07/12/20  4:02 AM   Result Value Ref Range    WBC 8.7 3.6 - 11.0 K/uL    RBC 4.46 3.80 - 5.20 M/uL    HGB 10.1 (L) 11.5 - 16.0 g/dL    HCT 31.6 (L) 35.0 - 47.0 %    MCV 70.9 (L) 80.0 - 99.0 FL    MCH 22.6 (L) 26.0 - 34.0 PG    MCHC 32.0 30.0 - 36.5 g/dL    RDW 15.2 (H) 11.5 - 14.5 %    PLATELET 221 150 - 400 K/uL    MPV 10.9 8.9 - 12.9 FL    NRBC 0.0 0 PER 100 WBC    ABSOLUTE NRBC 0.00 0.00 - 0.01 K/uL    NEUTROPHILS 81 (H) 32 - 75 %    LYMPHOCYTES 8 (L) 12 - 49 %    MONOCYTES 10 5 - 13 %    EOSINOPHILS 0 0 - 7 %    BASOPHILS 0 0 - 1 %    IMMATURE GRANULOCYTES 1 (H) 0.0 - 0.5 %    ABS. NEUTROPHILS 7.0 1.8 - 8.0 K/UL    ABS. LYMPHOCYTES 0.7 (L) 0.8 - 3.5 K/UL    ABS. MONOCYTES 0.9 0.0 - 1.0 K/UL    ABS. EOSINOPHILS 0.0 0.0 - 0.4 K/UL    ABS. BASOPHILS 0.0 0.0 - 0.1 K/UL    ABS. IMM. GRANS. 0.1 (H) 0.00 - 0.04 K/UL    DF SMEAR SCANNED      RBC COMMENTS HYPOCHROMIA  1+        RBC COMMENTS ANISOCYTOSIS  1+        RBC COMMENTS MICROCYTOSIS  1+       C REACTIVE PROTEIN, QT    Collection Time: 07/12/20  4:02 AM   Result Value Ref Range    C-Reactive protein 0.90 (H) 0.00 - 0.60 mg/dL   HOMOCYSTEINE, PLASMA    Collection Time: 07/12/20  4:02 AM   Result Value Ref Range    Homocysteine, plasma 7.4 3.7 - 13.9 umol/L       Assessment:  Hospital Problems  Date Reviewed: 05-25-2020          Codes Class Noted POA    Headache ICD-10-CM: R51.9  ICD-9-CM: 784.0  07/11/2020 Unknown        Tension vascular headache ICD-10-CM: G44.209  ICD-9-CM: 307.81  07/11/2020 Yes        Pituitary macroadenoma with extrasellar extension ([place]) ICD-10-CM:  D35.2  ICD-9-CM: 227.3  07/11/2020 Yes        History of surgical removal of pituitary gland (HCC) ICD-10-CM: Z98.890, E89.3  ICD-9-CM: V15.29  07/11/2020 Yes        Bilateral carotid artery stenosis ICD-10-CM: I65.23  ICD-9-CM: 433.10, 433.30  07/11/2020 Yes        Disturbance of memory ICD-10-CM: R41.3  ICD-9-CM: 780.93  07/11/2020 Yes        Acute alteration in mental status ICD-10-CM: R41.82  ICD-9-CM: 780.97  07/11/2020 Yes        Cerebral microvascular disease ICD-10-CM: I67.89  ICD-9-CM: 437.8  07/11/2020 Yes        Syncope and collapse ICD-10-CM: R55  ICD-9-CM: 780.2  07/18/2016 Yes              Plan:     1) Pituitary adenoma > This adenoma is not new and has been followed by Dr. Landis Gandy Aralu. We can not test her pituitary functions at this time as she has already received high dose steroids, which would negatively interfere with any ACTH/Cortisol testing. Since her MRI today showed a decrease in the size of her pituitary gland I recommend she follow up with Dr. Landis Gandy Aralu and Dr. Maryagnes Amos for further evaluation to see if she needs any more surgical intervention for this adenoma.    I spent 45 minutes on her case and > 50% of the time was spent reviewing the chart and coordinating her care with the primary team and the nurse caring for her.        Signed By: Linus Galas, MD     July 12, 2020

## 2020-07-12 NOTE — Progress Notes (Signed)
Problem: Mobility Impaired (Adult and Pediatric)  Goal: *Acute Goals and Plan of Care (Insert Text)  Note:   PHYSICAL THERAPY EVALUATION/DISCHARGE  Patient: Kimberly [name] (86 y.o. female)  Date: 07/12/2020  Primary Diagnosis: Headache [R51.9]        Precautions: standard         ASSESSMENT  Based on the objective data described below, the patient presents with generalized weakness, decreased activity tolerance, but patient likely at baseline for mobility.  Patient sitting EOB upon arrival; agreeable to mobility.  Patient ambulated 150' with quad cane and SBA. Patient with slow pace and shortened steps but no LOB noted.  Patient also ambulated up and down 5 steps with one handrail and CGA.  Patient up in chair at end of session.  Prior to admission patient was living with niece and also had an aide to assist.  Patient appears to be at baseline for mobility and no further PT recommended at this time.    Functional Outcome Measure:  The patient scored 75/100 on the Barthel Index outcome measure which is indicative of 25% decline in mobility.      Other factors to consider for discharge: good support at home     Further skilled acute physical therapy is not indicated at this time.     PLAN :  Recommendation for discharge: (in order for the patient to meet his/her long term goals)  No skilled physical therapy/ follow up rehabilitation needs identified at this time.    This discharge recommendation:  Has not yet been discussed the attending provider and/or case management    IF patient discharges home will need the following DME: patient owns DME required for discharge       SUBJECTIVE:   Patient stated ???I think I'm leaving today.???    OBJECTIVE DATA SUMMARY:   HISTORY:    Past Medical History:   Diagnosis Date    Adverse effect of anesthesia     slow to wake    Anginal pain ([place])     Arthritis     Breast cancer ([place]) 03/10/2018     left lumpectomy    Cancer ([place]) 01/2018    breast    Chronic pain     neuropathy     Constipation     GERD (gastroesophageal reflux disease)     Hearing loss     High cholesterol     Hypertension     Incontinence     Joint pain     Memory disorder     Muscle pain     Osteoporosis     Ringing in the ears     Snoring     Visual disturbance      Past Surgical History:   Procedure Laterality Date    HX BREAST LUMPECTOMY Left 03/10/2018    LEFT BREAST LUMPECTOMY WITH ULTRASOUND GUIDANCE performed by Luellen Pucker, MD at [facility]    HX HYSTERECTOMY      HX ORTHOPAEDIC      epidural pain mtg - pt unsure    HX OTHER SURGICAL Right 10/15/2016    ligament correction right foot    HX OTHER SURGICAL  11/26/2016    Pituitary tumor removal       Prior level of function: Patient with independent mobility and has supervision when needed  Personal factors and/or comorbidities impacting plan of care: pituitary surgery, breast CA    Home Situation  Home Environment: Private residence  # Steps to  Enter: 5  Rails to Enter: Yes  Kelly Services : Right  Wheelchair Ramp: No  One/Two Story Residence: One story (basement)  Living Alone: No  Support Systems: Other Family Member(s)  Patient Expects to be Discharged to:: House  Current DME Used/Available at Home: Cane, quad, Environmental consultant, rolling  Tub or Shower Type: Tub/Shower combination    EXAMINATION/PRESENTATION/DECISION MAKING:   Critical Behavior:  Neurologic State: Alert  Orientation Level: Oriented X4  Cognition: Appropriate for age attention/concentration, Follows commands  Safety/Judgement: Awareness of environment  Hearing:     Skin:  intact  Edema: none noted  Range Of Motion:  AROM: Generally decreased, functional           PROM: Within functional limits           Strength:    Strength: Generally decreased, functional                    Tone & Sensation:   Tone: Normal              Sensation: Intact               Coordination:  Coordination: Within functional limits  Vision:      Functional Mobility:  Bed Mobility:              Transfers:  Sit to Stand:  Supervision  Stand to Sit: Supervision        Bed to Chair: Supervision              Balance:   Sitting: Intact  Standing: Impaired  Standing - Static: Good  Standing - Dynamic : Constant support; Good  Ambulation/Gait Training:  Distance (ft): 150 Feet (ft)  Assistive Device: Gait belt; Cane, quad  Ambulation - Level of Assistance: Contact guard assistance        Gait Abnormalities: Decreased step clearance  Right Side Weight Bearing: Full  Left Side Weight Bearing: Full        Speed/Cadence: Pace decreased (<100 feet/min)                        Stairs:  Number of Stairs Trained: 5  Stairs - Level of Assistance: Contact guard assistance   Rail Use: Right     Functional Measure:  Barthel Index:    Bathing: 5  Bladder: 5  Bowels: 10  Grooming: 5  Dressing: 5  Feeding: 10  Mobility: 10  Stairs: 5  Toilet Use: 10  Transfer (Bed to Chair and Back): 10  Total: 75/100       The Barthel ADL Index: Guidelines  1. The index should be used as a record of what a patient does, not as a record of what a patient could do.  2. The main aim is to establish degree of independence from any help, physical or verbal, however minor and for whatever reason.  3. The need for supervision renders the patient not independent.  4. A patient's performance should be established using the best available evidence. Asking the patient, friends/relatives and nurses are the usual sources, but direct observation and common sense are also important. However direct testing is not needed.  5. Usually the patient's performance over the preceding 24-48 hours is important, but occasionally longer periods will be relevant.  6. Middle categories imply that the patient supplies over 50 per cent of the effort.  7. Use of aids to be independent is allowed.    Score Interpretation (  from [place])   80-100 Independent   60-79 Minimally independent   40-59 Partially dependent   20-39 Very dependent   <20 Totally dependent     -Mahoney, F.l., Barthel, D.W.  (1965). Functional evaluation: the Barthel Index. [place] (Commack., Cay[place] (1997). The Barthel activities of daily living index: self-reporting versus actual performance in the old (> or = 75 years). Journal of [place] 45(7), [place], J.J.M.F, Noni Saupe., Minette [place]. (1999). Measuring the change in disability after inpatient rehabilitation; comparison of the responsiveness of the Barthel Index and Functional Independence Measure. Journal of Neurology, Neurosurgery, and Psychiatry, 66(4), 2063714531.  Wilford Sports, N.J.A, [place],  W.J.M, & Koopmanschap, M.A. (2004) Assessment of post-stroke quality of life in cost-effectiveness studies: The usefulness of the Barthel Index and the EuroQoL-5D. Quality of Life Research, 71, 427-43            Physical Therapy Evaluation Charge Determination   History Examination Presentation Decision-Making   MEDIUM  Complexity : 1-2 comorbidities / personal factors will impact the outcome/ POC  MEDIUM Complexity : 3 Standardized tests and measures addressing body structure, function, activity limitation and / or participation in recreation  LOW Complexity : Stable, uncomplicated  LOW Complexity : FOTO score of 75-100      Based on the above components, the patient evaluation is determined to be of the following complexity level: LOW     Pain Rating:  None reported    Activity Tolerance:   Good      After treatment patient left in no apparent distress:   Sitting in chair, Call bell within reach, and Caregiver / family present    COMMUNICATION/EDUCATION:   The patient???s plan of care was discussed with: Registered nurse.     Fall prevention education was provided and the patient/caregiver indicated understanding. and Patient/family agree to work toward stated goals and plan of care.    Thank you for this referral.  Veverly Fells, PT   Time Calculation: 18 mins

## 2020-07-12 NOTE — Progress Notes (Signed)
Problem: Pressure Injury - Risk of  Goal: *Prevention of pressure injury  Description: Document Braden Scale and appropriate interventions in the flowsheet.  Outcome: Progressing Towards Goal  Note: Pressure Injury Interventions:  Sensory Interventions: Assess changes in LOC, Assess need for specialty bed, Float heels, Keep linens dry and wrinkle-free, Minimize linen layers    Moisture Interventions: Absorbent underpads, Apply protective barrier, creams and emollients, Check for incontinence Q2 hours and as needed, Internal/External urinary devices    Activity Interventions: Increase time out of bed    Mobility Interventions: HOB 30 degrees or less, PT/OT evaluation    Nutrition Interventions: Document food/fluid/supplement intake    Friction and Shear Interventions: HOB 30 degrees or less, Lift sheet, Minimize layers                Problem: Patient Education: Go to Patient Education Activity  Goal: Patient/Family Education  Outcome: Progressing Towards Goal     Problem: Falls - Risk of  Goal: *Absence of Falls  Description: Document Schmid Fall Risk and appropriate interventions in the flowsheet.  Outcome: Progressing Towards Goal  Note: Fall Risk Interventions:  Mobility Interventions: Communicate number of staff needed for ambulation/transfer, Patient to call before getting OOB         Medication Interventions: Patient to call before getting OOB, Teach patient to arise slowly                   Problem: Patient Education: Go to Patient Education Activity  Goal: Patient/Family Education  Outcome: Progressing Towards Goal

## 2020-07-12 NOTE — Progress Notes (Signed)
End of Shift Note    Bedside shift change report given to Jenny Reichmann (Soil scientist) by Olena Heckle (offgoing nurse).  Report included the following information SBAR, Kardex, Intake/Output and MAR    Shift worked:  7p-7a     Shift summary and any significant changes:     No change in pt condition.  All scheduled medications given.  Pt slept well.  Labs drawn as ordered.  Pt had MRI at beginning of shift.       Concerns for physician to address:       Zone phone for oncoming shift:   [phone]       Activity:  Activity Level: Up with Assistance  Number times ambulated in hallways past shift: 0  Number of times OOB to chair past shift: 0    Cardiac:   Cardiac Monitoring: No           Access:   Current line(s): PIV     Genitourinary:   Urinary status: voiding    Respiratory:   O2 Device: None (Room air)  Chronic home O2 use?: NO  Incentive spirometer at bedside: NO     GI:     Current diet:  ADULT DIET Regular  Passing flatus: YES  Tolerating current diet: YES       Pain Management:   Patient states pain is manageable on current regimen: YES    Skin:  Braden Score: 17  Interventions: increase time out of bed    Patient Safety:  Fall Score: Total Score: 2  Interventions: gripper socks, pt to call before getting OOB and stay with me (per policy)       Length of Stay:  Expected LOS: - - -  Actual LOS: [place]

## 2020-07-12 NOTE — Progress Notes (Signed)
Transition of Care Plan:    RUR: 13% Low   Disposition: Home with family support   Follow up appointments: Follow up with PCP and/or Specialist (voicemail left for physicians office-on AVS)  DME needed: N/A-pt has walker and cane at home   Transportation at Discharge: Niece by bedside to transport home   Keys or means to access home:      Family to provide   IM Medicare Letter: 2nd IM Medicare Letter to be given   Is patient a BCPI-A Bundle:  CM will provide if recommended          If yes, was Bundle Letter given?:  N/A  Is patient a Veteran and connected with the VA? N/A  If yes, was [place] transfer form completed and VA notified?   Caregiver Contact: Kimberly [name] [phone]  Discharge Caregiver contacted prior to discharge?    Family by bedside     Reason for Admission:  Headache                     RUR Score:          13%           Plan for utilizing home health:      N/A    PCP: First and Last name:  Latham-Solomon, Claris Gladden, MD     Name of Practice:    Are you a current patient: Yes/No: Yes   Approximate date of last visit: June-seen every 6 months   Can you participate in a virtual visit with your PCP: Yes, if recommended by physician                    Current Advanced Directive/Advance Care Plan: Full Code      Advance Care Planning     General Advance Care Planning (ACP) Conversation      Date of Conversation: 07/12/20  Conducted with: Patient with Decision Making Capacity    Healthcare Decision Maker:   No healthcare decision makers have been documented.   Click here to complete Clinical research associate of the Environmental health practitioner Relationship (ie "Primary")    Today we documented Decision Maker(s) consistent with Legal Next of Kin hierarchy.    Content/Action Overview:   DECLINED ACP conversation - will revisit periodically   Reviewed DNR/DNI and patient elects Full Code (Attempt Resuscitation)    Length of Voluntary ACP Conversation in minutes:  16  minutes    Kimberly [name]                     Transition of Care Plan:                              CM completed assessments niece: Kimberly [name], via telephone.  Pt is known to reside with family in their one story home.  Pt is active with PCP: seen every 6 months, and uses [facility] ([address]  CM attempted specialist appointment and CM left voicemail with physicians office.  Pt needs assistance with ADLs, and does not drive.  Family will assist with transport home today.  Pt has DME at home: walker and cane.  Pt has had HHC in the past, but no SNF.    Niece denies pt needing additional assistance at the time of d/c.          Jone Baseman, MSW, CM  [phone]

## 2020-07-12 NOTE — Progress Notes (Signed)
Consult  REFERRED BY:  Hilbert Odor, MD    CHIEF COMPLAINT: Right-sided headache and eye pain and borderline sed rate      Subjective:     Kimberly [name] is a 86 y.o. right-handed African-American female we are seeing as a new patient for evaluation of history of right-sided headache that began yesterday in the morning, radiated to her right eye, and lasted all day, and she came to the emergency room resume some pain medications seem to get better and now she says the headache is been gone ever since last night.  She had a sed rate of 45 on admission and the possibility of temporal arteritis was raised and she was maintained on prednisone 60 mg a day.  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  This apparently was done at [facility] or [facility] I believe with Dr. Candiss Norse though she says it was done at [facility].  She had a transsphenoidal hypophysectomy.  She not had any imaging done of the brain like an MRI I can see in the chart but her CT scan does show some persistent tumor apparently.  Her EEG also shows some slowing in the left hemisphere that she did not have before, and this might just be a postop change could suggest some other structural lesion in the left hemisphere.  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  She has no temporal artery tenderness and I doubt whether the sed rate significant so we will stop the steroids now.  She has no other new focal weakness, sensory loss, visual changes, or other focal neurological symptoms.  She has no fever, meningismus, or history of head trauma precipitating causes for this headache.  She actually says she feels  back to normal now.  Patient normally ambulates with a rolling walker due to slightly unsteady gait of her age.  Patient has recurrence of her pituitary adenoma, and her daughter thinks it was all gone after surgery, but is clearly smaller than it was before surgery, and she probably needs to see her neurosurgeon Dr. Candiss Norse to compare to the films postop that he did at [facility]. We will also set up an appointment for her to see Dr. Tracey Harries for endocrine evaluation, because it is a nonhormonal tumor and not really going that fast at her age of 20 we could probably follow with serial MRI scans every 3 to 6 months rather than risk surgery and her.   Her family does not want to wait for any of this and wants to go home now, so we notify the hospitalist and she will be discharged to follow-up with Dr. Candiss Norse and Dr. Wynetta Emery and can see [place] as needed  Her neurologist is Dr. Verdia Kuba and she already has an appointment to see him.  She has no headache at all now, is back to her normal baseline.  Past Medical History:   Diagnosis Date   ??? Adverse effect of anesthesia     slow to wake   ??? Anginal pain ([place])    ??? Arthritis    ??? Breast cancer ([place]) 03/10/2018  left lumpectomy   ??? Cancer ([place]) 01/2018    breast   ??? Chronic pain     neuropathy   ??? Constipation    ??? GERD (gastroesophageal reflux disease)    ??? Hearing loss    ??? High cholesterol    ??? Hypertension    ??? Incontinence    ??? Joint pain    ??? Memory disorder    ??? Muscle pain    ??? Osteoporosis    ??? Ringing in the ears    ??? Snoring    ??? Visual disturbance       Past Surgical History:   Procedure Laterality Date   ??? HX BREAST LUMPECTOMY Left 03/10/2018    LEFT BREAST LUMPECTOMY WITH ULTRASOUND GUIDANCE performed by Luellen Pucker, MD at [facility]   ??? HX HYSTERECTOMY     ??? HX ORTHOPAEDIC      epidural pain mtg - pt unsure   ??? HX OTHER SURGICAL Right 10/15/2016    ligament correction right foot   ??? HX OTHER SURGICAL  11/26/2016    Pituitary  tumor removal     Family History   Problem Relation Age of Onset   ??? Breast Cancer Mother 17   ??? No Known Problems Father    ??? Breast Cancer Sister 81   ??? Breast Cancer Daughter 22   ??? Dementia Brother    ??? Dementia Sister       Social History     Tobacco Use   ??? Smoking status: Never Smoker   ??? Smokeless tobacco: Never Used   Substance Use Topics   ??? Alcohol use: No         Current Facility-Administered Medications:   ???  aspirin chewable tablet 81 mg, 81 mg, Oral, DAILY, Din, Lyndee Hensen, MD, 81 mg at 07/12/20 0916  ???  cilostazoL (PLETAL) tablet 100 mg, 100 mg, Oral, BID, Din, Lyndee Hensen, MD, 100 mg at 07/12/20 3557  ???  ezetimibe (ZETIA) tablet 10 mg, 10 mg, Oral, DAILY, Din, Lyndee Hensen, MD, 10 mg at 07/12/20 3220  ???  folic acid (FOLVITE) tablet 1 mg, 1 mg, Oral, DAILY, Din, Nizam U, MD, 1 mg at 07/12/20 0917  ???  oxybutynin (DITROPAN) tablet 5 mg, 5 mg, Oral, DAILY, Din, Lyndee Hensen, MD, 5 mg at 07/12/20 2542  ???  pregabalin (LYRICA) capsule 100 mg, 100 mg, Oral, BID, Din, Lyndee Hensen, MD, 100 mg at 07/12/20 0917  ???  verapamil ER (CALAN-SR) tablet 180 mg, 180 mg, Oral, QHS, Din, Nizam U, MD, 180 mg at 07/11/20 2242  ???  sodium chloride (NS) flush 5-40 mL, 5-40 mL, IntraVENous, Q8H, Din, Nizam U, MD, 10 mL at 07/12/20 7062  ???  sodium chloride (NS) flush 5-40 mL, 5-40 mL, IntraVENous, PRN, Din, Lyndee Hensen, MD  ???  acetaminophen (TYLENOL) tablet 650 mg, 650 mg, Oral, Q6H PRN, 650 mg at 07/11/20 1625 **OR** acetaminophen (TYLENOL) suppository 650 mg, 650 mg, Rectal, Q6H PRN, Din, Lyndee Hensen, MD  ???  polyethylene glycol (MIRALAX) packet 17 g, 17 g, Oral, DAILY PRN, Din, Nizam U, MD  ???  ondansetron (ZOFRAN ODT) tablet 4 mg, 4 mg, Oral, Q8H PRN **OR** ondansetron (ZOFRAN) injection 4 mg, 4 mg, IntraVENous, Q6H PRN, Din, Lyndee Hensen, MD  ???  enoxaparin (LOVENOX) injection 40 mg, 40 mg, SubCUTAneous, DAILY, Din, Lyndee Hensen, MD, 40 mg at 07/12/20 3762        Allergies   Allergen Reactions   ???  Pcn [Penicillins] Hives and Myalgia   ??? Sulfa (Sulfonamide  Antibiotics) Unknown (comments)      MRI Results (most recent):  Results from Hospital Encounter encounter on 07/10/20    MRI BRAIN W WO CONT    Narrative  PRELIMINARY REPORT  A pituitary macroadenoma is again shown which compared with MRI of 06/05/2016 is  smaller, with less mass effect on the optic chiasm and right cavernous sinus. No  pituitary hemorrhage is demonstrated nor is there demonstration of other  extra-axial mass lesion. No intra-axial mass is shown. There is no evidence for  acute infarction. Ventricles and cortical sulci are stable in size and contour.  The vascular flow voids at the base of the brain remain normal in conspicuity  with dominant right vertebral artery again noted. Orbits and paranasal sinuses  remain within normal limits.  Preliminary report was provided by Dr. Minus Liberty, the on-call radiologist, at [facility]  Final report to follow.  END PRELIMINARY REPORT    FINAL REPORT BELOW  EXAM:  MRI BRAIN W WO CONT, MRA BRAIN WO CONT  INDICATION:  severe right sided headache, patient with pituitary macroadenoma,  due to overlapping cuts through the pituitary gland for pituitary tumor  TECHNIQUE:  Thin 2 mm sagittal and coronal T1 weighted images centered on the sella were  obtained followed by intravenous infusion 12 mL intravenous ProHance repeat thin  sagittal and coronal T1-weighted images. Axial FLAIR and T2-weighted images as  well as axial diffusion weighted images and whole head post gadolinium axial T1  images of the head were also obtained.  COMPARISON: CT head 07/10/20, MRI 06/05/16  FINDINGS:  As noted on the preliminary report the pituitary macroadenoma has had an  interval decrease in size since 2017. Previously measured approximately 1.5 x  1.6 cm and on today's exam measures approximately 1.3 x 1.2 cm. The suprasellar  cistern and  hypothalamus have normal appearance. Available history suggests  interval transsphenoidal pituitary surgery a few years ago. The optic nerves  appear  subjectively small.  There is no evidence of a right orbital mass, significant sinus inflammation,  temporal bone abnormality or other skull base finding to explain the patient's  right-sided pain.  Relatively minor T2 hyperintensity in periventricular white matter within the  range of normal considering age.  Flow voids are present in vertebral basilar and carotid artery systems.  The ventricular size and configuration are within normal limits.  No areas of abnormal signal or abnormal enhancement in the brain.  Chronic findings of cervical spondylosis and facet arthrosis with hypertrophic  changes around the odontoid. Slight progression around the odontoid since 2017  possible.    Impression  1. Enhancing pituitary macroadenoma again demonstrated with mild decrease in  size when compared to the last available prior exam in 2017.  2. No acute intracranial abnormality demonstrated.  3. Chronic cervical spondylosis and facet arthrosis as well as prominent  degenerative changes around the odontoid.    EXAM:  MRI BRAIN W WO CONT, MRA BRAIN WO CONT  INDICATION:  severe right sided headache, patient with pituitary macroadenoma,  due to overlapping cuts through the pituitary gland for pituitary tumor  TECHNIQUE:  Axial 3-D time-of-flight MR angiography was performed from the cranial base to  the proximal intracranial vessels. MIP reconstructions were created.  COMPARISON: MRI  FINDINGS:  The distal internal carotid arteries are relatively symmetric and without  stenosis. There is symmetric opacification/flow in the middle cerebral arteries  and anterior cerebral arteries.  The right vertebral artery is dominant. The left vertebral artery is somewhat  small. There is minimal contribution to the basilar artery from the left  vertebral artery.  The basilar artery is unremarkable and contribute supply to both posterior  cerebral arteries which primarily have fetal origins off of the internal carotid  arteries.    IMPRESSION:  Normal MRA of the head      Results from Hospital Encounter encounter on 07/10/20    MRI BRAIN W WO CONT    Narrative  PRELIMINARY REPORT  A pituitary macroadenoma is again shown which compared with MRI of 06/05/2016 is  smaller, with less mass effect on the optic chiasm and right cavernous sinus. No  pituitary hemorrhage is demonstrated nor is there demonstration of other  extra-axial mass lesion. No intra-axial mass is shown. There is no evidence for  acute infarction. Ventricles and cortical sulci are stable in size and contour.  The vascular flow voids at the base of the brain remain normal in conspicuity  with dominant right vertebral artery again noted. Orbits and paranasal sinuses  remain within normal limits.  Preliminary report was provided by Dr. Minus Liberty, the on-call radiologist, at [facility]  Final report to follow.  END PRELIMINARY REPORT    FINAL REPORT BELOW: EXAM:  MRI BRAIN W WO CONT, MRA BRAIN WO CONT  INDICATION:  severe right sided headache, patient with pituitary macroadenoma,  due to overlapping cuts through the pituitary gland for pituitary tumor  TECHNIQUE:  Thin 2 mm sagittal and coronal T1 weighted images centered on the sella were  obtained followed by intravenous infusion 12 mL intravenous ProHance repeat thin  sagittal and coronal T1-weighted images. Axial FLAIR and T2-weighted images as  well as axial diffusion weighted images and whole head post gadolinium axial T1  images of the head were also obtained.  COMPARISON: CT head 07/10/20, MRI 06/05/16  FINDINGS:  As noted on the preliminary report the pituitary macroadenoma has had an  interval decrease in size since 2017. Previously measured approximately 1.5 x  1.6 cm and on today's exam measures approximately 1.3 x 1.2 cm. The suprasellar  cistern and  hypothalamus have normal appearance. Available history suggests  interval transsphenoidal pituitary surgery a few years ago. The optic nerves  appear subjectively small.  There is no evidence  of a right orbital mass, significant sinus inflammation,  temporal bone abnormality or other skull base finding to explain the patient's  right-sided pain.  Relatively minor T2 hyperintensity in periventricular white matter within the  range of normal considering age.  Flow voids are present in vertebral basilar and carotid artery systems.  The ventricular size and configuration are within normal limits.  No areas of abnormal signal or abnormal enhancement in the brain.  Chronic findings of cervical spondylosis and facet arthrosis with hypertrophic  changes around the odontoid. Slight progression around the odontoid since 2017  possible.    Impression  1. Enhancing pituitary macroadenoma again demonstrated with mild decrease in  size when compared to the last available prior exam in 2017.  2. No acute intracranial abnormality demonstrated.  3. Chronic cervical spondylosis and facet arthrosis as well as prominent  degenerative changes around the odontoid.    EXAM:  MRI BRAIN W WO CONT, MRA BRAIN WO CONT  INDICATION:  severe right sided headache, patient with pituitary macroadenoma,  due to overlapping cuts through the pituitary gland for pituitary tumor  TECHNIQUE:  Axial 3-D time-of-flight MR angiography was performed from  the cranial base to  the proximal intracranial vessels. MIP reconstructions were created.  COMPARISON: MRI  FINDINGS:  The distal internal carotid arteries are relatively symmetric and without  stenosis. There is symmetric opacification/flow in the middle cerebral arteries  and anterior cerebral arteries.  The right vertebral artery is dominant. The left vertebral artery is somewhat  small. There is minimal contribution to the basilar artery from the left  vertebral artery.  The basilar artery is unremarkable and contribute supply to both posterior  cerebral arteries which primarily have fetal origins off of the internal carotid  arteries.    IMPRESSION: Normal MRA of the head    Review of  Systems:  A comprehensive review of systems was negative except for: Constitutional: positive for fatigue and malaise  Eyes: positive for Right eye pain  Musculoskeletal: positive for myalgias, arthralgias and stiff joints  Neurological: positive for headaches, memory problems, coordination problems and gait problems  Behvioral/Psych: positive for Memory loss, anxiety and depression   Vitals:    07/11/20 1452 07/11/20 2025 07/11/20 2241 07/12/20 0800   BP: (!) 145/67 114/69 113/66 129/63   Pulse: 76 85 87 (!) 53   Resp: 16 17 18 18    Temp: 98 ??F (36.7 ??C) 98.3 ??F (36.8 ??C) 98.1 ??F (36.7 ??C) 97.7 ??F (36.5 ??C)   SpO2: 98% 96% 96% 99%   Weight:       Height:         Objective:     I      NEUROLOGICAL EXAM:    Appearance:  The patient is poorly developed and nourished, provides a poor history and is in no acute distress.   Mental Status: Oriented to place and person, and the president, knows the month and the year, but not the day of the month, and has mild abnormal cognitive function and speech is fluent and no aphasia or dysarthria. Mood and affect appropriate.   Cranial Nerves:   Intact visual fields. Fundi are benign, disc are flat, no lesions seen on funduscopy. PERLA, EOM's full, no nystagmus, no ptosis. Facial sensation is normal. Corneal reflexes are not tested. Facial movement is symmetric. Hearing is abnnormal bilaterally. Palate is midline with normal sternocleidomastoid and trapezius muscles are normal. Tongue is midline.  Neck without meningismus or bruits  Temporal arteries are not tender or enlarged  TMJ areas are not tender on palpation   Motor:  4/5 strength in upper and lower proximal and distal muscles. Normal bulk and tone. No fasciculations.  Rapid alternating movement is symmetric and slow bilaterally   Reflexes:   Deep tendon reflexes 2+/4 and symmetrical.  No babinski or clonus present   Sensory:   Normal to touch, pinprick and vibration and temperature is decreased in both feet.  DSS is intact    Gait:  Abnormal gait as patient is mildly unsteady and has to use a rolling walker.   Tremor:   No tremor noted.   Cerebellar:   Mildly abnormal cerebellar signs present on Romberg and tandem testing and finger-nose-finger exam is unremarkable.   Neurovascular:  Normal heart sounds and regular rhythm, peripheral pulses decreased, and no carotid bruits.           Assessment:       ICD-10-CM ICD-9-CM    1. Acute intractable headache, unspecified headache type  R51.9 784.0    2. Acute alteration in mental status  R41.82 780.97    3. Bilateral carotid artery stenosis  I65.23 433.10  433.30    4. Cerebral microvascular disease  I67.89 437.8    5. Disturbance of memory  R41.3 780.93    6. History of surgical removal of pituitary gland ([place])  Z98.890 V15.29     E89.3     7. Pituitary macroadenoma with extrasellar extension ([facility])  D35.2 227.3    8. Syncope and collapse  R55 780.2    9. Tension vascular headache  G44.209 307.81    10. Memory loss  R41.3 780.93      Active Problems:    Syncope and collapse (07/18/2016)      Headache (07/11/2020)      Tension vascular headache (07/11/2020)      Pituitary macroadenoma with extrasellar extension ([place]) (07/11/2020)      History of surgical removal of pituitary gland ([place]) (07/11/2020)      Bilateral carotid artery stenosis (07/11/2020)      Disturbance of memory (07/11/2020)      Acute alteration in mental status (07/11/2020)      Cerebral microvascular disease (07/11/2020)        Plan:     Patient with longstanding history of headaches for about 3 years, and she is followed by Dr Verdia Kuba her neurologist at [facility], and has no headache now, some stopping the steroids because I do not think her headaches are coming from temporal arteritis.  She has a history of the pituitary tumor needs a follow-up scan, because apparently she been lost to follow-up with that.  I think she has seen Dr. Oletta Darter for treatment of that, but she is a little confused and thinks she might have been  done at [facility].  We will check metabolic parameters and repeat her sed rate looking for causes of headache, and she does have a slightly abnormal EEG in the left hemisphere which is new, and it might reflect partly her previous pituitary surgery.  However we need to rule out any structural lesion there.  Difficult case, discussed with the patient in detail and she is agreeable with plans of therapy as above.  Patient has recurrence of her pituitary adenoma, and her daughter thinks it was all gone after surgery, but is clearly smaller than it was before surgery, and she probably needs to see her neurosurgeon Dr. Candiss Norse to compare to the films postop that he did at [facility]. We will also set up an appointment for her to see Dr. Tracey Harries for endocrine evaluation, because it is a nonhormonal tumor and not really going that fast at her age of 30 we could probably follow with serial MRI scans every 3 to 6 months rather than risk surgery and her.   Her family does not want to wait for any of this and wants to go home now, so we notify the hospitalist and she will be discharged to follow-up with Dr. Candiss Norse and Dr. Wynetta Emery and can see [place] as needed  Her neurologist is Dr. Verdia Kuba and she already has an appointment to see him.  She has no headache at all now, is back to her normal baseline.  Discussed with the patient and her family all in detail. Also discussed with the nursing staff and hospitalist  Continue excellent medical care as you are, we will follow carefully with you    Signed By: Shaune Pollack, MD     July 12, 2020       CC: Hilbert Odor, MD  FAX: [phone]

## 2020-07-12 NOTE — Discharge Summary (Signed)
Discharge Summary by Dara Hoyer, MD at 07/12/20 0802                Author: Dara Hoyer, MD  Service: Internal Medicine  Author Type: Physician       Filed: 07/17/20 1724  Date of Service: 07/12/20 0802  Status: Signed          Editor: Dara Hoyer, MD (Physician)                                  Hospitalist Discharge Note      NAME: Kimberly [name]    DOB:  07-06-1924    MRN:  [id number]       Admit date: 07/11/2020      Discharge date: 07/12/20      PCP: Hilbert Odor, MD      Discharge Diagnoses:      Right-sided headache and eye pain POA resolved      Pituitary adenoma POA      Elevated BP 208/107 POA resolved      Left breast cancer       Essential hypertension.        Patient is vaccinated for Covid      Code Status: Full code      Discharge Medications:     Current Discharge Medication List              CONTINUE these medications which have NOT CHANGED          Details        cilostazoL (PLETAL) 100 mg tablet  TAKE 1 TABLET BY MOUTH TWICE DAILY   Qty: 180 Tablet, Refills:  0               verapamil ER (CALAN-SR) 180 mg CR tablet  Take 1 Tablet by mouth nightly.   Qty: 90 Tablet, Refills:  1               oxybutynin (DITROPAN) 5 mg tablet  TAKE 1 TABLET BY MOUTH TWICE DAILY   Qty: 180 Tablet, Refills:  1               alendronate (FOSAMAX) 70 mg tablet  TAKE 1 TABLET BY MOUTH EVERY 7 DAYS   Qty: 4 Tab, Refills:  5               acetaminophen (TYLENOL) 500 mg tablet  Take 1,000 mg by mouth every six (6) hours as needed for Pain.               estradiol (VAGIFEM) 10 mcg tab vaginal tablet  INSERT 1 T VAGINALLY TWICE PER WEEK   Refills: 3               isosorbide dinitrate (ISORDIL) 10 mg tablet  TAKE 1 TABLET BY MOUTH TWICE DAILY   Qty: 180 Tab, Refills:  0               aspirin 81 mg chewable tablet  Take 81 mg by mouth daily.               ezetimibe (ZETIA) 10 mg tablet  Take 1 Tablet by mouth daily.   Qty: 90 Tablet, Refills:  0               pregabalin (LYRICA) 100 mg  capsule  TAKE 1 CAPSULE BY MOUTH  TWICE DAILY   Qty: 60 Cap, Refills:  3          Associated Diagnoses: Neuropathy               folic acid (FOLVITE) 1 mg tablet  Take 1 Tab by mouth daily.   Qty: 90 Tab, Refills:  3               polyethylene glycol (MIRALAX) 17 gram/dose powder  TAKE 1 CAPFUL PO ONCE A DAY   Refills: 11                     STOP taking these medications                  DOK 100 mg capsule  Comments:    Reason for Stopping:                      lisinopriL (PriniviL) 10 mg tablet  Comments:    Reason for Stopping:                      omeprazole (PRILOSEC) 40 mg capsule  Comments:    Reason for Stopping:                      famotidine (PEPCID) 20 mg tablet  Comments:    Reason for Stopping:                      promethazine (PHENERGAN) 12.5 mg tablet  Comments:    Reason for Stopping:                      traMADol (ULTRAM) 50 mg tablet  Comments:    Reason for Stopping:                               Follow-up Information               Follow up With  Specialties  Details  Why  Contact Info              Colbert Ewing, MD  Endocrinology  Schedule an appointment as soon as possible for a visit in 2 weeks  PLEASE FOLLOW UP WITH PHYSICIAN OFFICE FOR [facility] APPOINTMENT-VOICEMAIL WAS LEFT  [address]   [address]   [place] [address]   [phone]                 Seward Carol, MD  Family Medicine  On 07/20/2020  APPOINTMENT TIME: 12P  [address]   [place] [address]   [phone]          Aralu, Marcene Duos, MD  Neurology  Schedule an appointment as soon as possible for a visit in 2 weeks    [address]   [address] [place] [address]   [phone]                 Mechele Collin, MD  Neurosurgery    [place] LEFT FOR PHYSICIAN OFFICE  [address]   [address]   [place] [address]   [phone]                   Time  spent on discharge:   I spent greater than 30 minutes on discharge, seeing  and examining the patient, reconciling home meds and new meds, coordinating care with case management, doing the discharge papers and the D/C summary      Discharge disposition: home      Discharge Condition: Stable        Summary of admission H+P(copied from Dr Din's Note):        CHIEF COMPLAINT:    ??                   ?  Eye Pain         ??  ??  eye pain on right today with it running. no visual change. denies injury; she has retinal pigmatosis     ??   History Provided By: Patient   ??   HPI:??Kimberly [name]??is a 86 year old history of ocular shingles, cataracts status post removal presenting with right-sided headache  pain and eye pain. ??Reports eye pain started on the right today with increased tear production. ??Pain is severe. ??While in the waiting room she developed a temporal headache which is also severe. ??She denies any blurry vision double vision.  ??She denies any unilateral numbness or weakness. ??She denies dizziness syncope or fall. ??No recent trauma to her head. ??No jaw claudication, myalgias, fever.   ??   There are no other complaints, changes, or physical findings at this time.   ??   PCP:??Latham-Solomon, Clarita Leber, MD   ??   We were asked to admit for work up and evaluation of the above problems.    ??   Vital Signs-Reviewed the patient's vital signs.   Patient Vitals for the past 12 hrs:          ??  Temp  Pulse  Resp  BP  SpO2            07/11/20 0159  97.9 ??F (36.6 ??C)  79  16  (!) 208/107  95 %     07/10/20 1823  98.2 ??F (36.8 ??C)  81  16  (!) 198/91  98 %     ??   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 Previously measured approximately 1.5 x   1.6 cm and on today's exam measures approximately 1.3 x 1.2 cm. The suprasellar   cistern and  hypothalamus have normal appearance. Available history suggests   interval transsphenoidal pituitary surgery a few years ago. The optic nerves    appear subjectively small.   There is no evidence of a right orbital mass, significant sinus inflammation,   temporal bone abnormality or other skull base finding to explain the patient's   right-sided pain.   Relatively minor T2 hyperintensity in periventricular white matter within the   range of normal considering age.   Flow voids are present in vertebral basilar and carotid artery systems.   The ventricular size and configuration are within normal limits.    No areas of abnormal signal or abnormal enhancement in the brain.   Chronic findings of cervical spondylosis and facet arthrosis with hypertrophic   changes around the odontoid. Slight progression around the odontoid since 2017   possible.   ??   IMPRESSION   1. Enhancing pituitary macroadenoma again demonstrated with mild decrease in   size when compared to the last available  prior exam in 2017.   2. No acute intracranial abnormality demonstrated.   3. Chronic cervical spondylosis and facet arthrosis as well as prominent   degenerative changes around the odontoid.   ??   EXAM:  MRI BRAIN W WO CONT, MRA BRAIN WO CONT   INDICATION:  severe right sided headache, patient with pituitary macroadenoma,   due to overlapping cuts through the pituitary gland for pituitary tumor   TECHNIQUE:   Axial 3-D time-of-flight MR angiography was performed from the cranial base to   the proximal intracranial vessels. MIP reconstructions were created.   COMPARISON: MRI   FINDINGS:   The distal internal carotid arteries are relatively symmetric and without   stenosis. There is symmetric opacification/flow in the middle cerebral arteries   and anterior cerebral arteries.   The right vertebral artery is dominant. The left vertebral artery is somewhat   small. There is minimal contribution to the basilar artery from the left   vertebral artery.   The basilar artery is unremarkable and contribute supply to both posterior   cerebral arteries which primarily have fetal origins off of the  internal carotid   arteries.   IMPRESSION: Normal MRA of the head        Hospital course:             Right-sided headache and eye pain POA   Pituitary adenoma POA   Elevated BP 208/107   Presented with headache and eye pain at home, resolved after admit          No visual changes   Concern for temporal arteritis, but symptoms more chronic   Known pituitary adenoma, prior resection   MRI brain with Enhancing pituitary macroadenoma again demonstrated with mild                   decrease in size when compared to the last available prior exam in 2017.   Denies visual complaint other than pain. Denies tenderness to the right temporal area   Sed rate is 38 to 45, essentially normal for age   CT head showed no acute intracranial pathology   ? Headaches related to the pituitary mass or elevated BP        BP 208/107 in ED, improved at discharge   Also takes nitrates   Checked hormone levels before discharge       12 noon       Cortisol 7.8                            TSH 0.82 with free T4 1.0                            IGF-1 ~ 63                            Prolactin 41.4                            Held checking sex hormone levels in 86 Y.O.   Consider cosyntropin stim test as outpatient   Seen by Dr Danton Sewer for neurology, held on consideration of temporal artery         Biopsy, stopped PO prednisone   Takes nitrates, d/w niece about talking with PCP about stopping if HA persistent   Outpatient  follow up   I called relative on 12/12 with results of hormone testing      Left breast cancer    patient to follow-up with her oncologist as scheduled as an outpatient      Essential hypertension.    Continue home meds       Patient is vaccinated for Covid      Code Status: Full code   Next of kin   Marlou Porch  Other Relative  [phone]              DVT Prophylaxis: Subcu Lovenox   GI Prophylaxis: not indicated      Baseline: Lives with niece and has an aide that comes to the house              Subjective:        "I do not  have a headache"   No further HA or eye pain   No visual changes   No new complaints   MRI brain with enhancing pituitary mass, slightly smaller      No Fevers, CP, SOB, cough, N/V, diarrhea      BP 129/63, 53, 97.7      Alert, no distress   CTA bilaterally   RRR, Nl S1/S2, no murmurs/rubs   Soft, NT.ND, + BS   Alert, follows commands, non focal motor exam      LAB DATA REVIEWED:       Recent Results (from the past 24 hour(s))     SED RATE (ESR)          Collection Time: 07/11/20  8:29 AM         Result  Value  Ref Range            Sed rate, automated  38 (H)  0 - 30 mm/hr       CRP, HIGH SENSITIVITY          Collection Time: 07/11/20  8:29 AM         Result  Value  Ref Range            CRP, High sensitivity  7.3  mg/L       IMMUNOELECTROPHORESIS (IMMUNOFIX.)          Collection Time: 07/11/20  8:29 AM         Result  Value  Ref Range            Immunofixation, serum  PENDING         Immunoglobulin G, Qt.  1,250  586 - 1,602 mg/dL       Immunoglobulin A, Qt.  441 (H)  64 - 422 mg/dL       Immunoglobulin M, Qt.  20 (L)  26 - 217 mg/dL       RHEUMATOID FACTOR, QT          Collection Time: 07/11/20  8:29 AM         Result  Value  Ref Range            Rheumatoid factor  <10  <15 IU/mL       DUPLEX CAROTID BILATERAL          Collection Time: 07/11/20 10:57 AM         Result  Value  Ref Range            Right subclavian prox PSV  176.3  cm/s       Right subclavian prox EDV  0.0  cm/s  Right cca dist sys  64.7  cm/s       Right CCA dist dias  9.8  cm/s       Right CCA prox sys  60.3  cm/s       Right CCA prox dias  10.9  cm/s       Right ICA dist sys  63.6  cm/s       Right ICA dist dias  17.5  cm/s       Right ICA mid sys  61.4  cm/s       Right ICA mid dias  13.1  cm/s       Right ICA prox sys  49.3  cm/s       Right ICA prox dias  13.1  cm/s       Right eca sys  96.9  cm/s       RIGHT EXTERNAL CAROTID ARTERY D  0.00  cm/s       Right vertebral sys  43.9  cm/s       RIGHT VERTEBRAL ARTERY D  0.00  cm/s       Right  ICA/CCA sys  1.0         Left subclavian prox PSV  108.2  cm/s       Left subclavian prox EDV  0.0  cm/s       Left CCA dist sys  52.7  cm/s       Left CCA dist dias  9.8  cm/s       Left CCA prox sys  90.3  cm/s       Left CCA prox dias  14.4  cm/s       Left ICA dist sys  84.5  cm/s       Left ICA dist dias  20.8  cm/s       Left ICA mid sys  45.3  cm/s       Left ICA mid dias  13.9  cm/s       Left ICA prox sys  43.5  cm/s       Left ICA prox dias  10.4  cm/s       Left ECA sys  93.0  cm/s       LEFT EXTERNAL CAROTID ARTERY D  0.00  cm/s       Left vertebral sys  43.5  cm/s       LEFT VERTEBRAL ARTERY D  11.25  cm/s       Left ICA/CCA sys  5.36         METABOLIC PANEL, BASIC          Collection Time: 07/12/20  4:02 AM         Result  Value  Ref Range            Sodium  142  136 - 145 mmol/L       Potassium  3.8  3.5 - 5.1 mmol/L       Chloride  112 (H)  97 - 108 mmol/L       CO2  24  21 - 32 mmol/L       Anion gap  6  5 - 15 mmol/L       Glucose  127 (H)  65 - 100 mg/dL       BUN  23 (H)  6 - 20 MG/DL       Creatinine  0.91  0.55 - 1.02 MG/DL  BUN/Creatinine ratio  25 (H)  12 - 20         GFR est AA  >60  >60 ml/min/1.28m            GFR est non-AA  58 (L)  >60 ml/min/1.755m           Calcium  8.8  8.5 - 10.1 MG/DL       MAGNESIUM          Collection Time: 07/12/20  4:02 AM         Result  Value  Ref Range            Magnesium  2.3  1.6 - 2.4 mg/dL       CBC WITH AUTOMATED DIFF          Collection Time: 07/12/20  4:02 AM         Result  Value  Ref Range            WBC  8.7  3.6 - 11.0 K/uL       RBC  4.46  3.80 - 5.20 M/uL       HGB  10.1 (L)  11.5 - 16.0 g/dL       HCT  31.6 (L)  35.0 - 47.0 %       MCV  70.9 (L)  80.0 - 99.0 FL       MCH  22.6 (L)  26.0 - 34.0 PG       MCHC  32.0  30.0 - 36.5 g/dL       RDW  15.2 (H)  11.5 - 14.5 %       PLATELET  221  150 - 400 K/uL       MPV  10.9  8.9 - 12.9 FL       NRBC  0.0  0 PER 100 WBC       ABSOLUTE NRBC  0.00  0.00 - 0.01 K/uL       NEUTROPHILS  81 (H)  32 - 75  %       LYMPHOCYTES  8 (L)  12 - 49 %       MONOCYTES  10  5 - 13 %       EOSINOPHILS  0  0 - 7 %       BASOPHILS  0  0 - 1 %       IMMATURE GRANULOCYTES  1 (H)  0.0 - 0.5 %       ABS. NEUTROPHILS  7.0  1.8 - 8.0 K/UL       ABS. LYMPHOCYTES  0.7 (L)  0.8 - 3.5 K/UL       ABS. MONOCYTES  0.9  0.0 - 1.0 K/UL       ABS. EOSINOPHILS  0.0  0.0 - 0.4 K/UL       ABS. BASOPHILS  0.0  0.0 - 0.1 K/UL       ABS. IMM. GRANS.  0.1 (H)  0.00 - 0.04 K/UL       DF  SMEAR SCANNED               RBC COMMENTS  HYPOCHROMIA   1+                  RBC COMMENTS  ANISOCYTOSIS   1+             RBC COMMENTS  MICROCYTOSIS   1+  C REACTIVE PROTEIN, QT          Collection Time: 07/12/20  4:02 AM         Result  Value  Ref Range            C-Reactive protein  0.90 (H)  0.00 - 0.60 mg/dL

## 2020-07-13 LAB — IMMUNOELECTROPHORESIS (IMMUNOFIX.)
Immunoglobulin A, Qt.: 441 mg/dL — ABNORMAL HIGH (ref 64–422)
Immunoglobulin A, Qt.: 441 mg/dL — ABNORMAL HIGH (ref 64–422)
Immunoglobulin G, Qt.: 1250 mg/dL (ref 586–1602)
Immunoglobulin G, Quantitative: 1250 mg/dL (ref 586–1602)
Immunoglobulin M, Qt.: 20 mg/dL — ABNORMAL LOW (ref 26–217)
Immunoglobulin M, Quantitative: 20 mg/dL — ABNORMAL LOW (ref 26–217)

## 2020-07-13 LAB — INSULIN-LIKE GROWTH FACTOR 1
INSULIN-LIKE GROWTH FACTOR I, 010369: 63 ng/mL
Insulin-Like Growth Factor I: 63 ng/mL

## 2020-07-13 LAB — PROLACTIN
Prolactin: 41.4 ng/mL
Prolactin: 41.4 ng/mL

## 2020-07-13 NOTE — Progress Notes (Signed)
 Care Transitions Initial Call    Call within 2 business days of discharge: Yes     Patient: Kimberly [name] Patient DOB: October 17, 1923 MRN: [id number]    Last Discharge [facility]       Complaint Diagnosis Description Type Department Provider    07/10/20 Eye Pain Acute intractable headache, unspecified headache type ... ED to Hosp-Admission (Discharged) (ADMIT) [id number] Katheryne Mickey Elsie SHAUNNA, MD; Cherie ORN...        Was this an external facility discharge? No     Challenges to be reviewed by the provider   Additional needs identified to be addressed with provider:  no  none       Method of communication with provider : chart routing    Component      Latest Ref Rng & Units 07/11/2020           8:29 AM   Immunoglobulin A, Qt.      64 - 422 mg/dL 558 (H)   Immunoglobulin M, Qt.      26 - 217 mg/dL 20 (L)     Component      Latest Ref Rng & Units 07/12/2020           4:02 AM   C-Reactive protein      0.00 - 0.60 mg/dL 9.09 (H)     Component      Latest Ref Rng & Units 07/12/2020 07/11/2020 04/13/2020           4:02 AM 12:33 AM 11:14 AM   Chloride      97 - 108 mmol/L 112 (H) 111 (H) 114 (H)     Component      Latest Ref Rng & Units 07/12/2020 07/11/2020 04/13/2020           4:02 AM 12:33 AM 11:14 AM   BUN      6 - 20 MG/DL 23 (H) 16 20   Creatinine      0.55 - 1.02 MG/DL 9.08 9.13 9.09   BUN/Creatinine ratio      12 - 20   25 (H) 19 22 (H)   GFR est AA      >60 ml/min/1.82m2 >60 >60 >60   GFR est non-AA      >60 ml/min/1.18m2 58 (L) >60 58 (L)     Component      Latest Ref Rng & Units 04/13/2020          11:14 AM   Albumin      3.5 - 5.0 g/dL 3.1 (L)     Component      Latest Ref Rng & Units 07/12/2020           4:02 AM   WBC      3.6 - 11.0 K/uL      Component      Latest Ref Rng & Units 07/11/2020 07/10/2020           8:29 AM 10:17 PM   Sed rate, automated      0 - 30 mm/hr 38 (H) 45 (H)     Component      Latest Ref Rng & Units 07/12/2020 07/11/2020 07/11/2020 07/11/2020           4:02 AM 12:33 AM 12:33 AM 12:33 AM   HGB      11.5 - 16.0 g/dL  89.8 (L)   88.2   HCT      35.0 - 47.0 % 31.6 (L)   38.0  MCV      80.0 - 99.0 FL 70.9 (L)   73.6 (L)   MCH      26.0 - 34.0 PG 22.6 (L)   22.7 (L)     Component      Latest Ref Rng & Units 04/13/2020 04/13/2020 04/13/2020 04/13/2020          11:14 AM 11:14 AM 11:14 AM 11:14 AM   HGB      11.5 - 16.0 g/dL    88.4   HCT      64.9 - 47.0 %    37.3   MCV      80.0 - 99.0 FL    74.3 (L)   MCH      26.0 - 34.0 PG    22.9 (L)     COVID-19 related testing was not completed during this admission.     Advance Care Planning:   Does patient have an Advance Directive:  Per patient's niece, Rock Re, patient does have a current ACP document.     Inpatient Readmission Risk score: Unplanned Readmit Risk Score: 11.1 ( )    Was this a readmission? no   Niece's stated reason for the admission: Pain in her right eye.     Patients top risk factors for readmission: Medical condition -  hypertension, recent tension vascular headache, bilateral carotid artery stenosis, cerebral microvascular disease, pituitary macroadenoma with extrasellar extension, high cholesterol, and history of malignant neoplasm of left breast per chart.   Interventions to address risk factors: Obtained and reviewed discharge summary and/or continuity of care documents and Education of patient/family/caregiver/guardian to support self-management-Education provided to patient's niece regarding signs/symptoms of pain and headache, niece verbalized an understanding.     Care Transition Nurse (CTN) contacted the patient's niece, Rock Re (on PHI dated 04-29-2020), by telephone to perform post hospital discharge assessment. Verified name and DOB with patient's niece, Rock Re, as identifiers. Provided introduction to self, and explanation of the CTN role.     CTN reviewed discharge instructions, medical action plan and red flags with niece who verbalized understanding. Were discharge instructions available to patient? yes. Reviewed appropriate site of care  based on symptoms and resources available to patient including: PCP, Specialist and When to call 911. Patient's niece was given an opportunity to ask questions and does not have any further questions or concerns at this time. The niece agrees to contact the PCP office for questions related to patient's healthcare.     Medication reconciliation was not performed with patient's niece during this phone conversation as niece declined stating she is not familiar with patient's medications.   Referral to Pharm D needed: Unknown at this time.      Home Health/Outpatient orders at discharge: none  Home Health company: n/a  Date of initial visit: n/a    Durable Medical Equipment ordered at discharge: None  Durable Medical Equipment Company: n/a  Horticulturist, commercial received: n/a    Covid Risk Education    Educated patient's niece about risk for severe COVID-19 due to risk factors according to CDC guidelines. CTN reviewed discharge instructions, medical action plan and red flag symptoms with the niece who verbalized understanding. Discussed COVID vaccination status: yes. Education provided on COVID-19 vaccination as appropriate. Discussed exposure protocols and quarantine with CDC Guidelines. Patient's niece was given an opportunity to verbalize any questions and concerns and agrees to contact CTN or health care provider for questions related to patient's healthcare.    Was patient  discharged with a pulse oximeter? no.     Discussed follow-up appointments. If no appointment was previously scheduled, appointment scheduling offered: yes. Is follow up appointment scheduled within 7 days of discharge? No, patient's TOC appointment is scheduled for 07-20-20, within 14 days of discharge.    [facility] follow up appointment(s):   Future Appointments   Date Time Provider Department Center   07/20/2020 12:00 PM Latham-Solomon, Orie NOVAK, MD [facility]  MAIN BS AMB   10/04/2020 11:20 AM MacDougall, Michael J, MD [facility] BS AMB   10/28/2020  9:40  AM Aralu, Cletus C, MD [facility] BS AMB   Niece states she has spoken to a representative at the office of Dr. Vicenta Johnson/endocrinology and is awaiting a call back with follow-up appointment date/time.     Non-[facility] follow up appointment(s): Niece states patient has neurosurgery follow-up appointment scheduled with Dr. MAURY Marek on 07-22-2020.    Plan for follow-up call in 10-14 days based on severity of symptoms and risk factors.  Plan for next call: symptom management-Review red flags of pain and headache, and follow up appointment-Evaluate if patient is attending follow-up appointments as scheduled, offer assistance with scheduling as needed.   CTN provided contact information for future needs.    Goals Addressed                 This Visit's Progress    . Understands red flags post discharge.        07-13-20: Red flags of pain and headache reviewed with patient's niece, Rock Re (on PHI dated 04-29-20), and niece verbalized an understanding. Niece has no red flags to report at this time. Patient has private pay home care aide Monday through Friday, for 4 hours each day. Niece states patient is utilizing a regular diet at home, and appetite is good. Niece reports patient has not had any falls in the last 12 months. Care Transitions Nurse will review red flags again on next phone conversation with patient/niece. JB

## 2020-07-13 NOTE — ACP (Advance Care Planning) (Signed)
Per patient's niece, Marlou Porch (on [place] dated 04-29-2020), patient does have a current ACP document.

## 2020-07-20 ENCOUNTER — Ambulatory Visit: Admit: 2020-07-20 | Discharge: 2020-07-20 | Payer: MEDICARE | Attending: Internal Medicine | Primary: Internal Medicine

## 2020-07-20 ENCOUNTER — Ambulatory Visit: Attending: Internal Medicine | Primary: Family Medicine

## 2020-07-20 DIAGNOSIS — Z0000 Encounter for general adult medical examination without abnormal findings: Secondary | ICD-10-CM

## 2020-07-20 NOTE — Progress Notes (Signed)
 Chief Complaint   Patient presents with   . Hospital Follow Up     pituitary tumor behind rt eye, did hormone level check at [facility]   . Annual Wellness Visit     medicare wellness     1. Have you been to the ER, urgent care clinic since your last visit?  Hospital[facility] since your last visit?[facility]    2. Have you seen or consulted any other health care providers outside of the [facility] since your last visit?  Include any pap smears or colon screening. No  Visit Vitals  BP 121/64   Pulse 79   Temp 97.9 F (36.6 C) (Oral)   Resp 18   Ht 5' 3 (1.6 m)   Wt 136 lb 8 oz (61.9 kg)   SpO2 98%   BMI 24.18 kg/m       This is the Subsequent Medicare Annual Wellness Exam, performed 12 months or more after the Initial AWV or the last Subsequent AWV    I have reviewed the patient's medical history in detail and updated the computerized patient record.       Assessment/Plan   Education and counseling provided:  Are appropriate based on today's review and evaluation    1. Medicare annual wellness visit, subsequent       Depression Risk Factor Screening     3 most recent PHQ Screens 07/20/2020   PHQ Not Done -   Little interest or pleasure in doing things Not at all   Feeling down, depressed, irritable, or hopeless Not at all   Total Score PHQ 2 0       Alcohol Risk Screen    Do you average more than 1 drink per night or more than 7 drinks a week:  No    On any one occasion in the past three months have you have had more than 3 drinks containing alcohol:  No        Functional Ability and Level of Safety    Hearing: Hearing is good.      Activities of Daily Living:  The home contains: handrails and grab bars  Patient needs help with:  transportation      Ambulation: with difficulty, uses a cane and walker     Fall Risk:  Fall Risk Assessment, last 12 mths 07/20/2020   Able to walk? Yes   Fall in past 12 months? 0   Do you feel unsteady? 1   Are you worried  about falling 1   Is the gait abnormal? 1   Number of falls in past 12 months 0   Fall with injury? -      Abuse Screen:  Patient is not abused       Cognitive Screening    Has your family/caregiver stated any concerns about your memory: no     Cognitive Screening: Normal - MMSE (Mini Mental Status Exam)    Health Maintenance Due     Health Maintenance Due   Topic Date Due   . COVID-19 Vaccine (1) Never done   . Shingrix Vaccine Age 52> (1 of 2) Never done   . Flu Vaccine (1) 04/06/2020       Patient Care Team   Patient Care Team:  Latham-Solomon, Orie NOVAK, MD as PCP - General (Internal Medicine)  Cleo, Orie NOVAK, MD as PCP - [facility] Empaneled Provider  MacDougall, Ozell PARAS, MD (General Surgery)  Lorren Real,  MD (Breast Surgery)  Burnetta Arabia, RN as Care Transitions Nurse    History     Patient Active Problem List   Diagnosis Code   . SOB (shortness of breath) on exertion R06.02   . Hypertension, essential, benign I10   . High cholesterol E78.00   . Chest pain with normal angiography--~ 15 yrs ago MCV R07.9   . Primary insomnia F51.01   . Memory loss R41.3   . Acute bacterial conjunctivitis of right eye H10.31   . Syncope and collapse R55   . Headache disorder R51.9   . Malignant neoplasm of upper-outer quadrant of left breast in female, estrogen receptor negative (HCC) C50.412, Z17.1   . Headache R51.9   . 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   . 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 History of surgical removal of pituitary gland (HCC) Z98.890, E89.3   . Bilateral carotid artery stenosis I65.23   . Disturbance of memory R41.3   . Acute alteration in mental status R41.82   . 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 Adverse effect of anesthesia     slow to wake   . Anginal pain (HCC)    . Arthritis    . Breast cancer (HCC) 03/10/2018     left lumpectomy   . Cancer (HCC) 01/2018    breast   . Chronic pain     neuropathy   . Constipation     . GERD (gastroesophageal reflux disease)    . Hearing loss    . High cholesterol    . Hypertension    . Incontinence    . Joint pain    . Memory disorder    . Muscle pain    . Osteoporosis    . Ringing in the ears    . Snoring    . Visual disturbance       Past Surgical History:   Procedure Laterality Date   . HX BREAST LUMPECTOMY Left 03/10/2018    LEFT BREAST LUMPECTOMY WITH ULTRASOUND GUIDANCE performed by MacDougall, Michael J, MD at [facility] OR   . HX HYSTERECTOMY     . HX ORTHOPAEDIC      epidural pain mtg - pt unsure   . HX OTHER SURGICAL Right 10/15/2016    ligament correction right foot   . HX OTHER SURGICAL  11/26/2016    Pituitary tumor removal     Current Outpatient Medications   Medication Sig Dispense Refill   . cilostazoL (PLETAL) 100 mg tablet TAKE 1 TABLET BY MOUTH TWICE DAILY 180 Tablet 0   . verapamil ER (CALAN-SR) 180 mg CR tablet Take 1 Tablet by mouth nightly. 90 Tablet 1   . ezetimibe (ZETIA) 10 mg tablet Take 1 Tablet by mouth daily. 90 Tablet 0   . oxybutynin (DITROPAN) 5 mg tablet TAKE 1 TABLET BY MOUTH TWICE DAILY 180 Tablet 1   . pregabalin  (LYRICA ) 100 mg capsule TAKE 1 CAPSULE BY MOUTH TWICE DAILY 60 Cap 3   . folic acid (FOLVITE) 1 mg tablet Take 1 Tab by mouth daily. 90 Tab 3   . alendronate (FOSAMAX) 70 mg tablet TAKE 1 TABLET BY MOUTH EVERY 7 DAYS 4 Tab 5   . acetaminophen  (TYLENOL ) 500 mg tablet Take 1,000 mg by mouth every six (6) hours as needed for Pain.     . estradiol  (VAGIFEM ) 10 mcg tab vaginal tablet INSERT 1 T VAGINALLY  TWICE PER WEEK  3   . isosorbide dinitrate (ISORDIL) 10 mg tablet TAKE 1 TABLET BY MOUTH TWICE DAILY 180 Tab 0   . polyethylene glycol (MIRALAX ) 17 gram/dose powder TAKE 1 CAPFUL PO ONCE A DAY  11   . aspirin  81 mg chewable tablet Take 81 mg by mouth daily.       Allergies   Allergen Reactions   . Pcn [Penicillins] Hives and Myalgia   . Sulfa (Sulfonamide Antibiotics) Unknown (comments)       Family History   Problem Relation Age of Onset   . Breast  Cancer Mother 53   . No Known Problems Father    . Breast Cancer Sister 72   . Breast Cancer Daughter 35   . Dementia Brother    . Dementia Sister      Social History     Tobacco Use   . Smoking status: Never Smoker   . Smokeless tobacco: Never Used   Substance Use Topics   . Alcohol use: No         Delon Silvan

## 2020-07-20 NOTE — Progress Notes (Signed)
Subjective:      Kimberly [name] is a 86 y.o. female who presents today for   Chief Complaint   Patient presents with   ??? Hospital Follow Up     pituitary tumor behind rt eye, did hormone level check at [facility]   ??? Annual Wellness Visit     medicare wellness   [facility]  Right-sided headache and eye pain POA  Pituitary adenoma POA  Elevated BP 208/107  Presented with headache and eye pain at home, resolved after admit         No visual changes  Concern for temporal arteritis, but symptoms more chronic  Known pituitary adenoma, prior resection  MRI brain with Enhancing pituitary macroadenoma again demonstrated with mild                  decrease in size when compared to the last available prior exam in 2017.  Denies visual complaint other than pain. Denies tenderness to the right temporal area  Sed rate is 38 to 45, essentially normal for age  CT head showed no acute intracranial pathology  ? Headaches related to the pituitary mass or elevated BP. Also questionif related to nitrates patient had been taking        Checked hormone levels before discharge      12 noon       Cortisol 7.8                           TSH 0.82 with free T4 1.0                           IGF-1 ~ 63                           Prolactin 41.4                           Held checking sex hormone levels in 85 Y.O.  Consider cosyntropin stim test as outpatient  Seen by Dr Danton Sewer for neurology, held on consideration of temporal artery        Biopsy, stopped PO prednisone  Takes nitrates, d/w niece about talking with PCP about stopping if HA persistent    ??  Left breast cancer   patient to follow-up with her oncologist as scheduled as an outpatient  ??  Essential hypertension.   Continue home meds  ??  Patient is vaccinated for Covid      Dr. Maryagnes Amos  appt scheduled with neurosurgeon  Dr. Landis Gandy Aralu  Has aapt on Jan 10  Dr. Eustaquio Maize- endocrinologist- was given appt for Sept 2022 as  needed    Currently patient says rt eye feels heavy, no pain  Questionable vision loss in eye- patient reports eye symptoms for the last year she says she has seen ophthalmologist twice  Still has mild nagging headache, takes Tylenol for that    Patient Active Problem List    Diagnosis Date Noted   ??? Headache 07/11/2020   ??? Tension vascular headache 07/11/2020   ??? Pituitary macroadenoma with extrasellar extension ([place]) 07/11/2020   ??? History of surgical removal of pituitary gland ([place]) 07/11/2020   ??? Bilateral carotid artery stenosis 07/11/2020   ??? Disturbance of memory 07/11/2020   ??? Acute alteration  in mental status 07/11/2020   ??? Cerebral microvascular disease 07/11/2020   ??? Malignant neoplasm of upper-outer quadrant of left breast in female, estrogen receptor negative ([place]) 01/28/2018   ??? Syncope and collapse 07/18/2016   ??? Headache disorder 07/18/2016   ??? 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 Memory loss 11/21/2014   ??? Acute bacterial conjunctivitis of right eye 11/21/2014   ??? SOB (shortness of breath) on exertion 06/21/2014   ??? Chest pain with normal angiography--~ 15 yrs ago MCV 06/21/2014   ??? Hypertension, essential, benign    ??? High cholesterol      Current Outpatient Medications   Medication Sig Dispense Refill   ??? cilostazoL (PLETAL) 100 mg tablet TAKE 1 TABLET BY MOUTH TWICE DAILY 180 Tablet 0   ??? verapamil ER (CALAN-SR) 180 mg CR tablet Take 1 Tablet by mouth nightly. 90 Tablet 1   ??? ezetimibe (ZETIA) 10 mg tablet Take 1 Tablet by mouth daily. 90 Tablet 0   ??? oxybutynin (DITROPAN) 5 mg tablet TAKE 1 TABLET BY MOUTH TWICE DAILY 180 Tablet 1   ??? pregabalin (LYRICA) 100 mg capsule TAKE 1 CAPSULE BY MOUTH TWICE DAILY 60 Cap 3   ??? folic acid (FOLVITE) 1 mg tablet Take 1 Tab by mouth daily. 90 Tab 3   ??? alendronate (FOSAMAX) 70 mg tablet TAKE 1 TABLET BY MOUTH EVERY 7 DAYS 4 Tab 5   ??? acetaminophen (TYLENOL) 500 mg tablet Take 1,000 mg by mouth every six (6) hours as needed for Pain.     ??? estradiol (VAGIFEM) 10  mcg tab vaginal tablet INSERT 1 T VAGINALLY TWICE PER WEEK  3   ??? isosorbide dinitrate (ISORDIL) 10 mg tablet TAKE 1 TABLET BY MOUTH TWICE DAILY 180 Tab 0   ??? polyethylene glycol (MIRALAX) 17 gram/dose powder TAKE 1 CAPFUL PO ONCE A DAY  11   ??? aspirin 81 mg chewable tablet Take 81 mg by mouth daily.       Allergies   Allergen Reactions   ??? Pcn [Penicillins] Hives and Myalgia   ??? Sulfa (Sulfonamide Antibiotics) Unknown (comments)     Past Medical History:   Diagnosis Date   ??? Adverse effect of anesthesia     slow to wake   ??? Anginal pain (HCC)    ??? Arthritis    ??? Breast cancer ([place]) 03/10/2018     left lumpectomy   ??? Cancer ([place]) 01/2018    breast   ??? Chronic pain     neuropathy   ??? Constipation    ??? GERD (gastroesophageal reflux disease)    ??? Hearing loss    ??? High cholesterol    ??? Hypertension    ??? Incontinence    ??? Joint pain    ??? Memory disorder    ??? Muscle pain    ??? Osteoporosis    ??? Ringing in the ears    ??? Snoring    ??? Visual disturbance      Past Surgical History:   Procedure Laterality Date   ??? HX BREAST LUMPECTOMY Left 03/10/2018    LEFT BREAST LUMPECTOMY WITH ULTRASOUND GUIDANCE performed by Luellen Pucker, MD at [facility]   ??? HX HYSTERECTOMY     ??? HX ORTHOPAEDIC      epidural pain mtg - pt unsure   ??? HX OTHER SURGICAL Right 10/15/2016    ligament correction right foot   ??? HX OTHER SURGICAL  11/26/2016    Pituitary tumor removal  Family History   Problem Relation Age of Onset   ??? Breast Cancer Mother 10   ??? No Known Problems Father    ??? Breast Cancer Sister 47   ??? Breast Cancer Daughter 85   ??? Dementia Brother    ??? Dementia Sister      Social History     Tobacco Use   ??? Smoking status: Never Smoker   ??? Smokeless tobacco: Never Used   Substance Use Topics   ??? Alcohol use: No        Review of Systems    A comprehensive review of systems was negative except for that written in the HPI.     Objective:     Visit Vitals  BP 121/64   Pulse 79   Temp 97.9 ??F (36.6 ??C) (Oral)   Resp 18   Ht 5\' 3"   (1.6 m)   Wt 136 lb 8 oz (61.9 kg)   LMP  (LMP Unknown)   SpO2 98%   BMI 24.18 kg/m??     General:  Alert, cooperative, no distress, appears stated age.   Head:  Normocephalic, without obvious abnormality, atraumatic.   Eyes:  Conjunctivae/corneas clear. PERRL, EOMs intact. Fundi benign.   Ears:  Normal TMs and external ear canals both ears.   Nose: Nares normal. Septum midline. Mucosa normal. No drainage or sinus tenderness.   Throat: Lips, mucosa, and tongue normal. Teeth and gums normal.   Neck: Supple, symmetrical, trachea midline, no adenopathy, thyroid: no enlargement/tenderness/nodules, no carotid bruit and no JVD.   Back:   Symmetric, no curvature. ROM normal. No CVA tenderness.   Lungs:   Clear to auscultation bilaterally.   Chest wall:  No tenderness or deformity.   Heart:  Regular rate and rhythm, S1, S2 normal, no murmur, click, rub or gallop.   Abdomen:   Soft, non-tender. Bowel sounds normal. No masses,  No organomegaly.   Extremities: Extremities normal, atraumatic, no cyanosis or edema.   Pulses: 2+ and symmetric all extremities.   Skin: Skin color, texture, turgor normal. No rashes or lesions.   Lymph nodes: Cervical, supraclavicular, and axillary nodes normal.   Neurologic: CNII-XII intact. Normal strength, sensation and reflexes throughout.       Assessment/Plan:       ICD-10-CM ICD-9-CM    1. Medicare annual wellness visit, subsequent  Z00.00 V70.0    2. Intractable episodic headache, unspecified headache type  R51.9 784.0 Follow up with neurosurgeon   3. Hypertension, essential, benign  I10 401.1 stable   4. Pituitary mass (HCC)  E23.6 253.8 Follow up with neurosurgeon          Advised her to call back or return to office if symptoms worsen/change/persist.  Discussed expected course/resolution/complications of diagnosis in detail with patient.    Medication risks/benefits/costs/interactions/alternatives discussed with patient.  She was given an after visit summary which includes diagnoses,  current medications, & vitals.  She expressed understanding with the diagnosis and plan.

## 2020-08-15 ENCOUNTER — Ambulatory Visit: Admit: 2020-08-15 | Payer: MEDICARE | Attending: Neurology | Primary: Internal Medicine

## 2020-08-15 ENCOUNTER — Ambulatory Visit: Attending: Neurology | Primary: Family Medicine

## 2020-08-15 DIAGNOSIS — G629 Polyneuropathy, unspecified: Secondary | ICD-10-CM

## 2020-08-15 MED ORDER — PREGABALIN 150 MG CAP
150 mg | ORAL_CAPSULE | Freq: Two times a day (BID) | ORAL | 5 refills | Status: DC
Start: 2020-08-15 — End: 2021-02-15

## 2020-08-15 MED ORDER — LIDOCAINE-PRILOCAINE 2.5 %-2.5 % TOPICAL CREAM
CUTANEOUS | 3 refills | Status: DC
Start: 2020-08-15 — End: 2021-05-12

## 2020-08-15 NOTE — Progress Notes (Signed)
Neurology Progress Note    NAME:  Kimberly [name]   DOB:   [birth year]   MRN:   [id number]     Date/Time:  08/15/2020  Subjective:    Kimberly [name] is a 87 y.o. female here today for follow up for meningioma, status post resection, headache, leg pain, numbness and tingling sensation  Patient says she has been doing fairly well pain has been minimal, she noted that her sleep has improved.  Patient says she is still having numbness and tingling sensation, burning sensation in the legs mostly at night.  She denies any recent fall.  Patient's Lyrica was increased from 100mg  to 150 mg in the last visit, however, going to the chart patient is still on 100 mg p.o. twice daily.  I will increase Lyrica to 150 mg p.o. twice daily.  I will give patient lidocaine cream to rub on the legs 2 times daily to decrease the burning sensation.  She states headache has improved , according to patient, she rarely has headache.   Medication has been helpful.  Headache is throbbing in nature mostly on one side that is the left side, associated with blurry vision.  She however still experiences occasional blurry vision.  She however, denies loss of consciousness dysphagia or odynophagia.   Review of Systems - General ROS: positive for  - fatigue and sleep disturbance  Psychological ROS: positive for - anxiety and sleep disturbances  Ophthalmic ROS: positive for - blurry vision, decreased vision and photophobia  ENT ROS: positive for - headaches, tinnitus and visual changes  Allergy and Immunology ROS: negative  Hematological and Lymphatic ROS: negative  Endocrine ROS: negative  Respiratory ROS: no cough, shortness of breath, or wheezing  Cardiovascular ROS: no chest pain or dyspnea on exertion  Gastrointestinal ROS: no abdominal pain, change in bowel habits, or black or bloody stools  Genito-Urinary ROS: no dysuria, trouble voiding, or hematuria  Musculoskeletal ROS: Muscle weakness, joint pains  Neurological ROS: Headaches, numbness and  tingling sensation,dizziness.  Dermatological ROS: negative          Medications reviewed:  Current Outpatient Medications   Medication Sig Dispense Refill   ??? lidocaine-prilocaine (EMLA) topical cream Apply to the leg twice a day 30 g 3   ??? pregabalin (LYRICA) 150 mg capsule Take 1 Capsule by mouth two (2) times a day. Max Daily Amount: 300 mg. 60 Capsule 5   ??? cilostazoL (PLETAL) 100 mg tablet TAKE 1 TABLET BY MOUTH TWICE DAILY 180 Tablet 0   ??? verapamil ER (CALAN-SR) 180 mg CR tablet Take 1 Tablet by mouth nightly. 90 Tablet 1   ??? ezetimibe (ZETIA) 10 mg tablet Take 1 Tablet by mouth daily. 90 Tablet 0   ??? oxybutynin (DITROPAN) 5 mg tablet TAKE 1 TABLET BY MOUTH TWICE DAILY 180 Tablet 1   ??? folic acid (FOLVITE) 1 mg tablet Take 1 Tab by mouth daily. 90 Tab 3   ??? alendronate (FOSAMAX) 70 mg tablet TAKE 1 TABLET BY MOUTH EVERY 7 DAYS 4 Tab 5   ??? acetaminophen (TYLENOL) 500 mg tablet Take 1,000 mg by mouth every six (6) hours as needed for Pain.     ??? estradiol (VAGIFEM) 10 mcg tab vaginal tablet INSERT 1 T VAGINALLY TWICE PER WEEK  3   ??? isosorbide dinitrate (ISORDIL) 10 mg tablet TAKE 1 TABLET BY MOUTH TWICE DAILY 180 Tab 0   ??? polyethylene glycol (MIRALAX) 17 gram/dose powder TAKE 1 CAPFUL PO ONCE A DAY  11   ??? aspirin 81 mg chewable tablet Take 81 mg by mouth daily.          Objective:   Vitals:  Vitals:    08/15/20 0947   BP: 102/65   Pulse: 91   Temp: 97.7 ??F (36.5 ??C)   TempSrc: Temporal   SpO2: 95%   Weight: 137 lb 6.4 oz (62.3 kg)   Height: 5\' 3"  (1.6 m)   PainSc:   0 - No pain               Lab Data Reviewed:  Lab Results   Component Value Date/Time    WBC 8.7 07/12/2020 04:02 AM    HCT 31.6 (L) 07/12/2020 04:02 AM    HGB 10.1 (L) 07/12/2020 04:02 AM    PLATELET 221 07/12/2020 04:02 AM       Lab Results   Component Value Date/Time    Sodium 142 07/12/2020 04:02 AM    Potassium 3.8 07/12/2020 04:02 AM    Chloride 112 (H) 07/12/2020 04:02 AM    CO2 24 07/12/2020 04:02 AM    Glucose 127 (H) 07/12/2020 04:02  AM    BUN 23 (H) 07/12/2020 04:02 AM    Creatinine 0.91 07/12/2020 04:02 AM    Calcium 8.8 07/12/2020 04:02 AM       No components found for: TROPQUANT    No results found for: ANA      No results found for: HBA1C, HBA1CPOC, HBA1CEXT     Lab Results   Component Value Date/Time    Vitamin B12 668 09/04/2018 01:07 PM       No results found for: ANA, 09/06/2018, XBANA    Lab Results   Component Value Date/Time    Cholesterol, total 181 04/13/2020 11:14 AM    HDL Cholesterol 69 04/13/2020 11:14 AM    LDL, calculated 100 04/13/2020 11:14 AM    VLDL, calculated 12 04/13/2020 11:14 AM    Triglyceride 60 04/13/2020 11:14 AM    CHOL/HDL Ratio 2.6 04/13/2020 11:14 AM         CT Results (recent):  Results from Hospital Encounter encounter on 07/10/20    CT HEAD WO CONT    Narrative  EXAM: CT HEAD WO CONT    INDICATION: headache    COMPARISON: May 23, 2017.    CONTRAST: None.    TECHNIQUE: Unenhanced CT of the head was performed using 5 mm images. Brain and  bone windows were generated. Coronal and sagittal reformats. CT dose reduction  was achieved through use of a standardized protocol tailored for this  examination and automatic exposure control for dose modulation.    FINDINGS:  The ventricles and sulci are enlarged. There are calcifications in the left  frontal lobe which are stable. There is no significant white matter disease.  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 The basilar cisterns are open. No CT evidence of  acute infarct.    The bone windows demonstrate no abnormalities. The visualized portions of the  paranasal sinuses and mastoid air cells are clear.    Impression  No acute findings.      MRI Results (recent):      IR Results (recent):  No results found for this or any previous visit.      VAS/US Results (recent):  Results from Hospital Encounter encounter on 04/24/16    DUPLEX LOWER EXT VENOUS RIGHT    Narrative  HISTORY: Right leg edema and pain.    PROCEDURE: Multiple real time  duplex and color Doppler  interrogation of the  right lower extremity deep venous system were performed.    FINDINGS: The veins are compressible and demonstrate normal phasicity and  augmentation. No filling defects are identified to suggest deep venous  thrombosis. The veins of the groin and thigh and knee and calf were evaluated.    However, there is noted to be a right suprapatellar joint effusion with debris  measuring 6.1 x 1.6 cm.    Impression  IMPRESSION:    No evidence of deep venous thrombosis.    Left suprapatellar joint effusion with debris.      PHYSICAL EXAM:  General:    Alert, cooperative, no distress, appears stated age.     Head:   Normocephalic, without obvious abnormality, atraumatic.  Eyes:   Conjunctivae/corneas clear.  PERRLA  Nose:  Nares normal. No drainage or sinus tenderness.  Throat:    Lips, mucosa, and tongue normal.  No Thrush  Neck:  Supple, symmetrical,  no adenopathy, thyroid: non tender    no carotid bruit and no JVD.  Back:    Symmetric,  No CVA tenderness.  Lungs:   Clear to auscultation bilaterally.  No Wheezing or Rhonchi. No rales.  Chest wall:  No tenderness or deformity. No Accessory muscle use.  Heart:   Regular rate and rhythm,  no murmur, rub or gallop.  Abdomen:   Soft, non-tender. Not distended.  Bowel sounds normal. No masses  Extremities: Extremities normal, atraumatic, No cyanosis.  No edema. No clubbing  Skin:     Texture, turgor normal. No rashes or lesions.  Not Jaundiced  Lymph nodes: Cervical, supraclavicular normal.  Psych:  Good insight.  Not depressed.  Not anxious or agitated.      NEUROLOGICAL EXAM:  Appearance:  The patient is well developed, well nourished, provides a coherent history and is in no acute distress.   Mental Status: Oriented to time, place and person. Mood and affect appropriate.   Cranial Nerves:   Intact visual fields. Fundi are benign. PERLA, EOM's full, no nystagmus, no ptosis. Facial sensation is normal. Corneal reflexes are intact. Facial movement is symmetric.  Hearing is normal bilaterally. Palate is midline with normal sternocleidomastoid and trapezius muscles are normal. Tongue is midline.   Motor:  5/5 strength in upper extremity proximal and distal, 5/5 proximal lower extremity and bilateral foot drop. Normal bulk and tone. No fasciculations.   Reflexes:   Deep tendon reflexes 1+/4 and symmetrical.   Sensory:    Dysesthesia to touch, pinprick and vibration.   Gait:   Unsteady gait.  Ambulates with cane   Tremor:   No tremor noted.   Cerebellar:  No cerebellar signs present.   Neurovascular:  Normal heart sounds and regular rhythm, peripheral pulses intact, and no carotid bruits.     Assesment  1. Polyneuropathy    - pregabalin (LYRICA) 150 mg capsule; Take 1 Capsule by mouth two (2) times a day. Max Daily Amount: 300 mg.  Dispense: 60 Capsule; Refill: 5    2. Sensory neuropathy    - pregabalin (LYRICA) 150 mg capsule; Take 1 Capsule by mouth two (2) times a day. Max Daily Amount: 300 mg.  Dispense: 60 Capsule; Refill: 5    3. Meningioma (HCC)  Stable    ___________________________________________________  PLAN: Medication and plan discussed with patient      ICD-10-CM ICD-9-CM    1. Polyneuropathy  G62.9 356.9 pregabalin (LYRICA) 150 mg capsule   2. Sensory neuropathy  G62.9 356.9 pregabalin (LYRICA) 150 mg capsule   3. Meningioma (HCC)  D32.9 225.2      Follow-up and Dispositions    ?? Return in about 6 months (around 02/12/2021).             ___________________________________________________    Attending Physician: Nelwyn Salisbury, MD

## 2020-09-09 MED ORDER — CILOSTAZOL 100 MG TAB
100 mg | ORAL_TABLET | ORAL | 0 refills | Status: DC
Start: 2020-09-09 — End: 2020-12-12

## 2020-09-22 MED ORDER — EZETIMIBE 10 MG TAB
10 mg | ORAL_TABLET | Freq: Every day | ORAL | 0 refills | Status: AC
Start: 2020-09-22 — End: 2021-05-12

## 2020-10-04 ENCOUNTER — Ambulatory Visit: Payer: MEDICARE | Attending: Surgery | Primary: Internal Medicine

## 2020-10-10 MED ORDER — OMEPRAZOLE 40 MG CAP, DELAYED RELEASE
40 mg | ORAL_CAPSULE | ORAL | 3 refills | Status: DC
Start: 2020-10-10 — End: 2021-05-12

## 2020-10-28 ENCOUNTER — Encounter: Attending: Neurology | Primary: Internal Medicine

## 2020-10-30 MED ORDER — VERAPAMIL ER 180 MG TAB
180 mg | ORAL_TABLET | ORAL | 1 refills | Status: DC
Start: 2020-10-30 — End: 2020-10-31

## 2020-11-02 MED ORDER — VERAPAMIL ER 180 MG TAB
180 mg | ORAL_TABLET | Freq: Every evening | ORAL | 1 refills | Status: DC
Start: 2020-11-02 — End: 2021-05-12

## 2020-11-08 ENCOUNTER — Ambulatory Visit: Payer: MEDICARE | Attending: Surgery | Primary: Internal Medicine

## 2020-11-21 ENCOUNTER — Ambulatory Visit: Admit: 2020-11-21 | Discharge: 2020-11-21 | Payer: MEDICARE | Attending: Surgery | Primary: Internal Medicine

## 2020-11-21 ENCOUNTER — Ambulatory Visit: Attending: Surgery | Primary: Family Medicine

## 2020-11-21 DIAGNOSIS — Z171 Estrogen receptor negative status [ER-]: Secondary | ICD-10-CM

## 2020-11-21 DIAGNOSIS — C50412 Malignant neoplasm of upper-outer quadrant of left female breast: Secondary | ICD-10-CM

## 2020-11-21 NOTE — Progress Notes (Signed)
Progress Notes by Luellen Pucker, MD at 11/21/20 1000                Author: Luellen Pucker, MD  Service: --  Author Type: Physician       Filed: 11/21/20 1027  Encounter Date: 11/21/2020  Status: Signed          Editor: Luellen Pucker, MD (Physician)               HISTORY OF PRESENT ILLNESS   Kimberly [name] is a 87 y.o.  female who comes in for follow up by Hilbert Odor, MD for  breast cancer   Follow-up   Associated symptoms include headaches. Pertinent negatives include no chest pain,  no abdominal pain and no shortness of breath.    Breast Problem   Associated  symptoms include headaches. Pertinent negatives include no chest pain, no abdominal pain and no shortness of breath.    Breast Cancer   Associated symptoms include headaches. Pertinent negatives include no chest pain,  no abdominal pain and no shortness of breath.    she went for screening mammogram and was noted to have a density in the UOQ of the left breast.  Subsequently she had an [place] confirming a 2.0 x 1.8 x 1.6 cm in the 0100 position 6 cm from nipple.   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  She had not felt a lump, skin changes, nipple changes or drainage, unexplained weight loss or bone pain.  She had menarche at 41, only pregnancy at  53, menopause at 77 and did not take HRT.  Her MGM, M, D, and sister all died from metastatic breast cancer.   She underwent a left lumpectomy for a T2NxMx IDC 03/10/2018.  She received adjuvant WBRT Briscoe Burns).  She had a bilateral 3D dx mammogram 01/13/2020  was BIRADS 2.        Past Medical History:        Diagnosis  Date         ?  Adverse effect of anesthesia            slow to wake         ?  Anginal pain ([place])       ?  Arthritis       ?  Breast cancer ([place])  03/10/2018           left lumpectomy         ?  Cancer ([place])  01/2018          breast         ?  Chronic pain            neuropathy         ?   Constipation       ?  GERD (gastroesophageal reflux disease)       ?  Hearing loss       ?  High cholesterol       ?  Hypertension       ?  Incontinence       ?  Joint pain       ?  Memory disorder       ?  Muscle pain       ?  Osteoporosis       ?  Ringing in the ears       ?  Snoring           ?  Visual disturbance            Past Surgical History:         Procedure  Laterality  Date          ?  HX BREAST LUMPECTOMY  Left  03/10/2018          LEFT BREAST LUMPECTOMY WITH ULTRASOUND GUIDANCE performed by Luellen Pucker, MD at [facility]          ?  HX HYSTERECTOMY         ?  HX ORTHOPAEDIC              epidural pain mtg - pt unsure          ?  HX OTHER SURGICAL  Right  10/15/2016          ligament correction right foot          ?  HX OTHER SURGICAL    11/26/2016          Pituitary tumor removal          Family History         Problem  Relation  Age of Onset          ?  Breast Cancer  Mother  82     ?  No Known Problems  Father       ?  Breast Cancer  Sister  65     ?  Breast Cancer  Daughter  53     ?  Cancer  Daughter       ?  Dementia  Brother            ?  Dementia  Sister            Social History          Tobacco Use         ?  Smoking status:  Never Smoker     ?  Smokeless tobacco:  Never Used       Vaping Use         ?  Vaping Use:  Never used       Substance Use Topics         ?  Alcohol use:  Yes             Comment: rarely         ?  Drug use:  No          Current Outpatient Medications        Medication  Sig         ?  verapamil ER (CALAN-SR) 180 mg CR tablet  Take 1 Tablet by mouth nightly.     ?  ezetimibe (ZETIA) 10 mg tablet  Take 1 Tablet by mouth daily.     ?  cilostazoL (PLETAL) 100 mg tablet  TAKE 1 TABLET BY MOUTH TWICE DAILY     ?  pregabalin (LYRICA) 150 mg capsule  Take 1 Capsule by mouth two (2) times a day. Max Daily Amount: 300 mg.     ?  oxybutynin (DITROPAN) 5 mg tablet  TAKE 1 TABLET BY MOUTH TWICE DAILY     ?  folic acid (FOLVITE) 1 mg tablet  Take 1 Tab by mouth daily.      ?  alendronate (FOSAMAX) 70 mg tablet  TAKE 1 TABLET BY MOUTH EVERY 7 DAYS     ?  acetaminophen (TYLENOL) 500 mg tablet  Take 1,000  mg by mouth every six (6) hours as needed for Pain.     ?  estradiol (VAGIFEM) 10 mcg tab vaginal tablet  INSERT 1 T VAGINALLY TWICE PER WEEK     ?  isosorbide dinitrate (ISORDIL) 10 mg tablet  TAKE 1 TABLET BY MOUTH TWICE DAILY     ?  polyethylene glycol (MIRALAX) 17 gram/dose powder  TAKE 1 CAPFUL PO ONCE A DAY     ?  aspirin 81 mg chewable tablet  Take 81 mg by mouth daily.     ?  omeprazole (PRILOSEC) 40 mg capsule  TAKE ONE CAPSULE BY MOUTH EVERY DAY (Patient not taking: Reported on 11/21/2020)         ?  lidocaine-prilocaine (EMLA) topical cream  Apply to the leg twice a day (Patient not taking: Reported on 11/21/2020)          No current facility-administered medications for this visit.          Allergies        Allergen  Reactions         ?  Pcn [Penicillins]  Hives and Myalgia         ?  Sulfa (Sulfonamide Antibiotics)  Unknown (comments)              Review of Systems    Constitutional: Positive for malaise/fatigue. Negative for chills, diaphoresis, fever and weight loss.    HENT: Positive for hearing loss. Negative for sore throat.     Eyes: Negative for blurred vision and discharge.         Vision loss    Respiratory: Negative for cough, shortness of breath and wheezing.     Cardiovascular: Positive for leg swelling. Negative for chest pain, palpitations, orthopnea and claudication.    Gastrointestinal: Positive for nausea. Negative for abdominal pain, constipation, diarrhea, heartburn, melena and vomiting.    Genitourinary: Positive for dysuria. Negative for flank pain, frequency and hematuria.    Musculoskeletal: Positive for joint pain and myalgias . Negative for back pain and neck pain.    Skin: Negative for rash.    Neurological: Positive for headaches. Negative for dizziness, speech change, focal weakness, seizures, loss of consciousness and weakness.      Endo/Heme/Allergies: Does not bruise/bleed easily.    Psychiatric/Behavioral: Negative for depression and memory loss.       Visit Vitals      BP  136/76 (BP 1 Location: Left upper arm, BP Patient Position: Sitting)     Pulse  75     Temp  97.3 ??F (36.3 ??C) (Temporal)     Resp  20     Ht  5\' 3"  (1.6 m)     Wt  61.4 kg (135 lb 4.8 oz)     LMP   (LMP Unknown)     SpO2  99%        BMI  23.97 kg/m??           Physical Exam   Exam conducted with a chaperone present.   Constitutional:        General: She is not in acute distress.     Appearance: She is well-developed. She is not diaphoretic.    HENT:       Head: Normocephalic and atraumatic.      Mouth/Throat:      Pharynx: No oropharyngeal exudate.    Eyes:       General: No scleral icterus.     Conjunctiva/sclera: Conjunctivae normal.  Pupils: Pupils are equal, round, and reactive to light.   Neck:       Thyroid: No thyromegaly.      Vascular: No JVD.      Trachea: No tracheal deviation.    Cardiovascular:       Rate and Rhythm: Normal rate and regular rhythm.      Heart sounds: No murmur heard.   No friction rub. No gallop.     Pulmonary:       Effort: Pulmonary effort is normal. No respiratory distress.      Breath sounds: Normal breath sounds. No wheezing or rales.   Chest:    Breasts: Breasts are symmetrical.      Right: Normal. No inverted nipple, mass, nipple discharge, skin change, tenderness, axillary  adenopathy or supraclavicular adenopathy.      Left: Mass (induration at lumpectomy  site) and skin change  (radiation changes ) present. No inverted nipple, nipple discharge, tenderness, axillary adenopathy or supraclavicular adenopathy.             Abdominal :      General: Bowel sounds are normal. There is no distension.      Palpations: Abdomen is soft. There is no mass.      Tenderness: There is no abdominal tenderness. There is no guarding or rebound.     Musculoskeletal:          General: Normal range of motion.      Cervical back: Normal range of motion  and neck supple.     Lymphadenopathy:       Cervical: No cervical adenopathy.      Upper Body:      Right upper body: No supraclavicular or axillary adenopathy.      Left upper body: No supraclavicular or axillary adenopathy.   Skin:      General: Skin is warm and dry.      Coloration: Skin is not pale.      Findings: No erythema or rash.    Neurological:       Mental Status: She is alert and oriented to person, place, and time.      Cranial Nerves: No cranial nerve deficit.    Psychiatric:         Behavior: Behavior normal.         Thought Content: Thought content normal.         Judgment: Judgment normal.                ASSESSMENT and PLAN   1.  Left breast cancer early stage, Clinically stage 2A (T2N0M0). Triple negative Dx 01/2018.   NED at 4 years and 10 months   2.  Strong family hx breast cancer      3.  Allergy to PCN with hives and upset stomach.  Ok for Ancef   4.  Essential hypertension.  On rx   5.  COVID-19.  Got first dose of vaccine   6.  Social.   Here with with aide today   Lives with niece and has an aide that comes to the house      Bilateral 3D dx mammogram 01/2021 if health is still good and she desires      RTC 6 months         Luellen Pucker, MD FACS

## 2020-11-21 NOTE — Progress Notes (Signed)
 Identified pt with two pt identifiers(name and DOB). Reviewed record in preparation for visit and have obtained necessary documentation. All patient medications has been reviewed.    Chief Complaint   Patient presents with   . Follow-up     6 month breast check up, Malignant neoplasm of upper-outer quadrant of left breast in female, estrogen receptor negative       Health Maintenance Due   Topic   . COVID-19 Vaccine (1)   . Shingrix Vaccine Age 85> (1 of 2)   . Pneumococcal 65+ years (2 - PCV)       Vitals:    11/21/20 0952   BP: 136/76   Pulse: 75   Resp: 20   Temp: 97.3 F (36.3 C)   TempSrc: Temporal   SpO2: 99%   Weight: 61.4 kg (135 lb 4.8 oz)   Height: 5' 3 (1.6 m)   PainSc:   0 - No pain       4.Have you been to the ER, urgent care clinic since your last visit?  Hospitalized since your last visit?No    5. Have you seen or consulted any other health care providers outside of the [facility] since your last visit?  Include any pap smears or colon screening. No      Patient is accompanied by self I have received verbal consent from Kimberly [name] to discuss any/all medical information while they are present in the room.

## 2020-12-12 MED ORDER — OXYBUTYNIN CHLORIDE 5 MG TAB
5 mg | ORAL_TABLET | ORAL | 1 refills | Status: DC
Start: 2020-12-12 — End: 2021-05-12

## 2020-12-12 MED ORDER — CILOSTAZOL 100 MG TAB
100 mg | ORAL_TABLET | ORAL | 0 refills | Status: DC
Start: 2020-12-12 — End: 2021-05-12

## 2021-01-08 ENCOUNTER — Inpatient Hospital Stay
Admit: 2021-01-08 | Discharge: 2021-01-08 | Disposition: A | Discharge status: Home / Self Care | Payer: MEDICARE | Attending: Emergency Medicine

## 2021-01-08 ENCOUNTER — Inpatient Hospital Stay: Admit: 2021-01-08 | Discharge: 2021-01-08 | Payer: MEDICARE

## 2021-01-08 ENCOUNTER — Emergency Department: Admit: 2021-01-08 | Payer: MEDICARE | Primary: Internal Medicine

## 2021-01-08 DIAGNOSIS — M549 Dorsalgia, unspecified: Secondary | ICD-10-CM

## 2021-01-08 DIAGNOSIS — Z5321 Procedure and treatment not carried out due to patient leaving prior to being seen by health care provider: Secondary | ICD-10-CM

## 2021-01-08 MED ORDER — IBUPROFEN 400 MG TAB
400 mg | ORAL | Status: AC
Start: 2021-01-08 — End: 2021-01-08
  Administered 2021-01-08: 13:00:00 via ORAL

## 2021-01-08 MED FILL — IBUPROFEN 400 MG TAB: 400 mg | ORAL | Qty: 2

## 2021-01-08 NOTE — ED Provider Notes (Signed)
The history is provided by the patient and a relative.   Fall  The accident occurred 6 to 12 hours ago. The fall occurred while walking. She fell from a height of ground level. She landed on hard floor. There was no blood loss. The pain is moderate. She was ambulatory at the scene. There was no entrapment after the fall. There was no drug use involved in the accident. There was no alcohol use involved in the accident. Pertinent negatives include no visual change, no fever, no numbness, no abdominal pain, no bowel incontinence, no nausea, no vomiting, no hematuria, no headaches, no extremity weakness, no hearing loss, no loss of consciousness, no tingling and no laceration. The risk factors include being elderly.  The symptoms are aggravated by pressure on injury. She has tried acetaminophen for the symptoms. The treatment provided no relief. It is unknown when the patient last had a tetanus shot.        Past Medical History:   Diagnosis Date   ??? Adverse effect of anesthesia     slow to wake   ??? Anginal pain ([place])    ??? Arthritis    ??? Breast cancer ([place]) 03/10/2018     left lumpectomy   ??? Cancer ([place]) 01/2018    breast   ??? Chronic pain     neuropathy   ??? Constipation    ??? GERD (gastroesophageal reflux disease)    ??? Hearing loss    ??? High cholesterol    ??? Hypertension    ??? Incontinence    ??? Joint pain    ??? Memory disorder    ??? Muscle pain    ??? Osteoporosis    ??? Ringing in the ears    ??? Snoring    ??? Visual disturbance        Past Surgical History:   Procedure Laterality Date   ??? HX BREAST LUMPECTOMY Left 03/10/2018    LEFT BREAST LUMPECTOMY WITH ULTRASOUND GUIDANCE performed by Luellen Pucker, MD at [facility]   ??? HX HYSTERECTOMY     ??? HX ORTHOPAEDIC      epidural pain mtg - pt unsure   ??? HX OTHER SURGICAL Right 10/15/2016    ligament correction right foot   ??? HX OTHER SURGICAL  11/26/2016    Pituitary tumor removal         Family History:   Problem Relation Age of Onset   ??? Breast Cancer Mother 62   ??? No  Known Problems Father    ??? Breast Cancer Sister 36   ??? Breast Cancer Daughter 71   ??? Cancer Daughter    ??? Dementia Brother    ??? Dementia Sister        Social History     Socioeconomic History   ??? Marital status: WIDOWED     Spouse name: Not on file   ??? Number of children: Not on file   ??? Years of education: Not on file   ??? Highest education level: Not on file   Occupational History   ??? Not on file   Tobacco Use   ??? Smoking status: Never Smoker   ??? Smokeless tobacco: Never Used   Vaping Use   ??? Vaping Use: Never used   Substance and Sexual Activity   ??? Alcohol use: Yes     Comment: rarely   ??? Drug use: No   ??? Sexual activity: Never   Other Topics Concern   ??? Not on  file   Social History Narrative   ??? Not on file     Social Determinants of Health     Financial Resource Strain:    ??? Difficulty of Paying Living Expenses: Not on file   Food Insecurity:    ??? Worried About Running Out of Food in the Last Year: Not on file   ??? Ran Out of Food in the Last Year: Not on file   Transportation Needs:    ??? Lack of Transportation (Medical): Not on file   ??? Lack of Transportation (Non-Medical): Not on file   Physical Activity:    ??? Days of Exercise per Week: Not on file   ??? Minutes of Exercise per Session: Not on file   Stress:    ??? Feeling of Stress : Not on file   Social Connections:    ??? Frequency of Communication with Friends and Family: Not on file   ??? Frequency of Social Gatherings with Friends and Family: Not on file   ??? Attends Religious Services: Not on file   ??? Active Member of Clubs or Organizations: Not on file   ??? Attends Archivist Meetings: Not on file   ??? Marital Status: Not on file   Intimate Partner Violence:    ??? Fear of Current or Ex-Partner: Not on file   ??? Emotionally Abused: Not on file   ??? Physically Abused: Not on file   ??? Sexually Abused: Not on file   Housing Stability:    ??? Unable to Pay for Housing in the Last Year: Not on file   ??? Number of Places Lived in the Last Year: Not on file   ???  Unstable Housing in the Last Year: Not on file         ALLERGIES: Pcn [penicillins] and Sulfa (sulfonamide antibiotics)    Review of Systems   Constitutional: Negative for activity change, chills and fever.   HENT: Negative for nosebleeds, sore throat, trouble swallowing and voice change.    Eyes: Negative for visual disturbance.   Respiratory: Negative for shortness of breath.    Cardiovascular: Negative for chest pain and palpitations.   Gastrointestinal: Negative for abdominal pain, bowel incontinence, constipation, diarrhea, nausea and vomiting.   Genitourinary: Negative for difficulty urinating, dysuria, hematuria and urgency.   Musculoskeletal: Positive for back pain. Negative for extremity weakness, neck pain and neck stiffness.   Skin: Negative for color change.   Allergic/Immunologic: Negative for immunocompromised state.   Neurological: Negative for dizziness, tingling, seizures, loss of consciousness, syncope, weakness, light-headedness, numbness and headaches.   Psychiatric/Behavioral: Negative for behavioral problems, confusion, hallucinations, self-injury and suicidal ideas.       Vitals:    01/08/21 0715   BP: 134/80   Pulse: 70   Resp: 17   Temp: 97.9 ??F (36.6 ??C)   SpO2: 95%            Physical Exam  Vitals and nursing note reviewed.   Constitutional:       General: She is not in acute distress.     Appearance: She is well-developed. She is not diaphoretic.   HENT:      Head: Atraumatic.   Neck:      Trachea: No tracheal deviation.   Cardiovascular:      Comments: Warm and well perfused  Pulmonary:      Effort: Pulmonary effort is normal. No respiratory distress.   Musculoskeletal:         General: Normal  range of motion.   Skin:     General: Skin is warm and dry.      Findings: No laceration.   Neurological:      Mental Status: She is alert.      Coordination: Coordination normal.   Psychiatric:         Behavior: Behavior normal.         Thought Content: Thought content normal.         Judgment:  Judgment normal.          MDM     This is a 87 year old female with past medical history, review of systems, physical exam as above, presenting with complaints of back pain, status post ground-level fall.  She presents with her niece, who lives with her.  She states that approximate 11 PM last night she was walking in the bathroom, when a "rug gave way", causing her to fall backward, landing on her bottom and then her back.  She denies striking her head or loss of consciousness.  She states she was able to return to a standing position and ambulate without assistance.  She endorses thoracic and lumbar back pain since the incident, refractory to Tylenol she took last night.  She presented to another emergency department prior to arrival here, and "could not wait that long".  She denies new numbness or weakness, has free range of motion upper and lower extremities without pain, instability or crepitus.  She has mild midline thoracic and lumbar tenderness to palpation without step-off.  She noted to be mildly hypertensive, afebrile without tachycardia, satting well on room air.  Differential includes contusion versus fracture.  Plan to provide additional pain control, and obtain plain films of the thoracic and lumbar spine.  We will reassess, and make a disposition.    Procedures    8:12 AM  Imaging without acute finding, patient remains in no acute distress, discussed results with patient and niece at bedside, recommend heat, rest, over-the-counter pain control, fall prevention, primary care follow-up as needed, return precautions given.  Patient and family in agreement with plan.

## 2021-01-08 NOTE — ED Notes (Signed)
Patient arrives with CC of GLF after slipping on a rug in the bathroom, she denies LOC or hitting her head but is complaining of lower and middle back pain

## 2021-01-13 ENCOUNTER — Ambulatory Visit: Payer: MEDICARE | Primary: Internal Medicine

## 2021-01-18 ENCOUNTER — Ambulatory Visit: Admit: 2021-01-18 | Discharge: 2021-01-18 | Payer: MEDICARE | Attending: Internal Medicine | Primary: Internal Medicine

## 2021-01-18 ENCOUNTER — Ambulatory Visit: Attending: Internal Medicine | Primary: Family Medicine

## 2021-01-18 DIAGNOSIS — R296 Repeated falls: Secondary | ICD-10-CM

## 2021-01-18 MED ORDER — LIDOCAINE 5 % (700 MG/PATCH) ADHESIVE PATCH
5 % | CUTANEOUS | 0 refills | Status: DC
Start: 2021-01-18 — End: 2021-01-23

## 2021-01-18 NOTE — Progress Notes (Signed)
Subjective:      Kimberly [name] is a 87 y.o. female who presents today for   Chief Complaint   Patient presents with   ??? Hospital Follow Up     Fall  The accident occurred 6 to 12 hours ago. The fall occurred while walking. She fell from a height of ground level. She landed on hard floor. There was no blood loss. The pain was moderate. She was ambulatory at the scene. There was no entrapment after the fall.  Pertinent negatives included no visual change, no fever, no numbness, no abdominal pain, no bowel incontinence, no nausea, no vomiting, no hematuria, no headaches, no extremity weakness, no hearing loss, no loss of consciousness, no tingling and no laceration. The risk factors include being elderly.  The symptoms were aggravated by pressure on injury. She has tried acetaminophen for the symptoms.     Patient arrives with CC of GLF after slipping on a rug in the bathroom, she denies LOC or hitting her head but is complaining of lower and middle back pain     IMPRESSION xray lumbar spine  ??  1. Chronic anterolisthesis at L3-L4 and L4-L5 with severe degenerative disc  disease.  2. No definite acute osseous abnormality.      IMPRESSION  Xray thoracic spine   No acute osseous abnormality.  ??  She is taking tylenol for pain and lyrica        HYPERTENSION  BP today initially 193/90  Repeat BP 187/84 per MD after relaxing  Aide states patient has been very "stressed out" about the visit      appetite is good and she is sleeping ok according to her aide who is present at the visit    ??    Patient Active Problem List    Diagnosis Date Noted   ??? Headache 07/11/2020   ??? Tension vascular headache 07/11/2020   ??? Pituitary macroadenoma with extrasellar extension ([place]) 07/11/2020   ??? History of surgical removal of pituitary gland ([place]) 07/11/2020   ??? Bilateral carotid artery stenosis 07/11/2020   ??? Disturbance of memory 07/11/2020   ??? Acute alteration in mental status 07/11/2020   ??? Cerebral microvascular disease 07/11/2020    ??? Malignant neoplasm of upper-outer quadrant of left breast in female, estrogen receptor negative ([place]) 01/28/2018   ??? Syncope and collapse 07/18/2016   ??? Headache disorder 07/18/2016   ??? 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 Memory loss 11/21/2014   ??? Acute bacterial conjunctivitis of right eye 11/21/2014   ??? SOB (shortness of breath) on exertion 06/21/2014   ??? Chest pain with normal angiography--~ 15 yrs ago [facility] 06/21/2014   ??? Hypertension, essential, benign    ??? High cholesterol      Current Outpatient Medications   Medication Sig Dispense Refill   ??? oxybutynin (DITROPAN) 5 mg tablet TAKE 1 TABLET BY MOUTH TWICE DAILY 180 Tablet 1   ??? cilostazoL (PLETAL) 100 mg tablet TAKE 1 TABLET BY MOUTH TWICE DAILY 180 Tablet 0   ??? verapamil ER (CALAN-SR) 180 mg CR tablet Take 1 Tablet by mouth nightly. 90 Tablet 1   ??? ezetimibe (ZETIA) 10 mg tablet Take 1 Tablet by mouth daily. 90 Tablet 0   ??? pregabalin (LYRICA) 150 mg capsule Take 1 Capsule by mouth two (2) times a day. Max Daily Amount: 300 mg. 60 Capsule 5   ??? folic acid (FOLVITE) 1 mg tablet Take 1 Tab by mouth daily. 90 Tab 3   ???  alendronate (FOSAMAX) 70 mg tablet TAKE 1 TABLET BY MOUTH EVERY 7 DAYS 4 Tab 5   ??? acetaminophen (TYLENOL) 500 mg tablet Take 1,000 mg by mouth every six (6) hours as needed for Pain.     ??? estradiol (VAGIFEM) 10 mcg tab vaginal tablet INSERT 1 T VAGINALLY TWICE PER WEEK  3   ??? isosorbide dinitrate (ISORDIL) 10 mg tablet TAKE 1 TABLET BY MOUTH TWICE DAILY 180 Tab 0   ??? polyethylene glycol (MIRALAX) 17 gram/dose powder TAKE 1 CAPFUL PO ONCE A DAY  11   ??? aspirin 81 mg chewable tablet Take 81 mg by mouth daily.     ??? omeprazole (PRILOSEC) 40 mg capsule TAKE ONE CAPSULE BY MOUTH EVERY DAY (Patient not taking: Reported on 11/21/2020) 90 Capsule 3   ??? lidocaine-prilocaine (EMLA) topical cream Apply to the leg twice a day (Patient not taking: Reported on 11/21/2020) 30 g 3     Allergies   Allergen Reactions   ??? Pcn [Penicillins] Hives and Myalgia   ???  Sulfa (Sulfonamide Antibiotics) Unknown (comments)     Past Medical History:   Diagnosis Date   ??? Adverse effect of anesthesia     slow to wake   ??? Anginal pain (HCC)    ??? Arthritis    ??? Breast cancer ([place]) 03/10/2018     left lumpectomy   ??? Cancer ([place]) 01/2018    breast   ??? Chronic pain     neuropathy   ??? Constipation    ??? GERD (gastroesophageal reflux disease)    ??? Hearing loss    ??? High cholesterol    ??? Hypertension    ??? Incontinence    ??? Joint pain    ??? Memory disorder    ??? Muscle pain    ??? Osteoporosis    ??? Ringing in the ears    ??? Snoring    ??? Visual disturbance      Past Surgical History:   Procedure Laterality Date   ??? HX BREAST LUMPECTOMY Left 03/10/2018    LEFT BREAST LUMPECTOMY WITH ULTRASOUND GUIDANCE performed by Luellen Pucker, MD at [facility]   ??? HX HYSTERECTOMY     ??? HX ORTHOPAEDIC      epidural pain mtg - pt unsure   ??? HX OTHER SURGICAL Right 10/15/2016    ligament correction right foot   ??? HX OTHER SURGICAL  11/26/2016    Pituitary tumor removal     Family History   Problem Relation Age of Onset   ??? Breast Cancer Mother 32   ??? No Known Problems Father    ??? Breast Cancer Sister 73   ??? Breast Cancer Daughter 46   ??? Cancer Daughter    ??? Dementia Brother    ??? Dementia Sister      Social History     Tobacco Use   ??? Smoking status: Never Smoker   ??? Smokeless tobacco: Never Used   Substance Use Topics   ??? Alcohol use: Yes     Comment: rarely        Review of Systems    A comprehensive review of systems was negative except for that written in the HPI.     Objective:     Visit Vitals  BP (!) 193/90 (BP 1 Location: Right arm, BP Patient Position: Sitting)   Pulse (!) 102   Temp 98 ??F (36.7 ??C) (Oral)   Resp 18   Ht 5\' 3"  (1.6 m)  Wt 134 lb (60.8 kg)   LMP  (LMP Unknown)   SpO2 98%   BMI 23.74 kg/m??     General:  Alert, cooperative, no distress, appears stated age.   Head:  Normocephalic, without obvious abnormality, atraumatic.   Eyes:  Conjunctivae/corneas clear. PERRL, EOMs intact.    Ears:   Normal TMs and external ear canals both ears.   Nose: Nares normal. Septum midline. Mucosa normal. No drainage or sinus tenderness.   Throat: Lips, mucosa, and tongue normal. Teeth and gums normal.   Neck: Supple, symmetrical, trachea midline, no adenopathy, thyroid: no enlargement/tenderness/nodules, no carotid bruit and no JVD.   Back:   Symmetric, no curvature. ROM  diminished. No CVA tenderness. Tender thoracic and lumbar spine   Lungs:   Clear to auscultation bilaterally.   Chest wall:  No tenderness or deformity.   Heart:  Regular rate and rhythm, S1, S2 normal, no murmur, click, rub or gallop.   Abdomen:   Soft, non-tender. Bowel sounds normal. No masses,  No organomegaly.   Extremities: Extremities normal, atraumatic, no cyanosis or edema.   Pulses: 2+ and symmetric all extremities.   Skin: Skin color, texture, turgor normal. No rashes or lesions.       Neurologic: CNII-XII intact., unstable gait       Assessment/Plan:       ICD-10-CM ICD-9-CM    1. Frequent falls  R29.6 V15.88 [place]   2. Cerebral microvascular disease  I67.89 437.8    3. Bilateral low back pain with sciatica, sciatica laterality unspecified, unspecified chronicity  M54.40 724.3 REFERRAL TO HOME HEALTH  Tylenol  Lyrica  lidoderm patches   4. Gait instability  R26.81 781.2 REFERRAL TO HOME HEALTH   5. PVD (peripheral vascular disease) (HCC)  I73.9 443.9    6. Pituitary mass (HCC)  E23.6 253.8          Advised her to call back or return to office if symptoms worsen/change/persist.  Discussed expected course/resolution/complications of diagnosis in detail with patient.    Medication risks/benefits/costs/interactions/alternatives discussed with patient.  She was given an after visit summary which includes diagnoses, current medications, & vitals.  She expressed understanding with the diagnosis and plan.

## 2021-01-18 NOTE — Progress Notes (Signed)
 Kimberly [name] is a 87 y.o. female    Chief Complaint   Patient presents with   . Hospital Follow Up     1. Have you been to the ER, urgent care clinic since your last visit?  Hospitalized since your last visit? Yes at [facility] on January 08 2021 for Fall    2. Have you seen or consulted any other health care providers outside of the [facility] since your last visit?  Include any pap smears or colon screening. No

## 2021-01-23 MED ORDER — LIDOCAINE 5 % (700 MG/PATCH) ADHESIVE PATCH
5 % | MEDICATED_PATCH | CUTANEOUS | 0 refills | Status: DC
Start: 2021-01-23 — End: 2021-03-07

## 2021-02-13 ENCOUNTER — Ambulatory Visit: Admit: 2021-02-13 | Payer: MEDICARE | Attending: Neurology | Primary: Internal Medicine

## 2021-02-13 ENCOUNTER — Ambulatory Visit: Attending: Neurology | Primary: Internal Medicine

## 2021-02-13 DIAGNOSIS — G629 Polyneuropathy, unspecified: Secondary | ICD-10-CM

## 2021-02-13 MED ORDER — PREDNISONE 5 MG TAB
5 mg | ORAL_TABLET | ORAL | 0 refills | Status: DC
Start: 2021-02-13 — End: 2021-03-07

## 2021-02-13 NOTE — Progress Notes (Signed)
Neurology Progress Note    NAME:  Kimberly [name]   DOB:   September 21, 1923   MRN:   [id number]     Date/Time:  02/13/2021  Subjective:   Kimberly [name] is a 87 y.o. female here today for follow up for meningioma, status post resection, headache, leg pain, numbness and tingling s ensation  Patient was accompanied by her niece, Ms. Mohammed to the visit.  Patient says on 01/08/2021 she had a fall apparently slipped and fell on her back hitting her head.  Was taken to the emergency room, evaluated but only x-ray of the cervical and lumbar spine was obtained which showed no fracture.  Today patient says she is experiencing headache.  I will obtain CT scan of the head without contrast as the headache appears to be new onset.  I will give patient a usual prednisone taper.  Patient says she is still having numbness and tingling sensation, burning sensation in the legs mostly at night, also right fingers.  I will continue patient on the rest of her medications.  I will give patient lidocaine cream to rub on the legs 2 times daily to decrease the burning sensation.  She however still experiences occasional blurry vision.  She however, denies loss of consciousness dysphagia or odynophagia.   Review of Systems - General ROS: positive for  - fatigue and sleep disturbance  Psychological ROS: positive for - anxiety and sleep disturbances  Ophthalmic ROS: positive for - blurry vision, decreased vision and photophobia  ENT ROS: positive for - headaches, tinnitus and visual changes  Allergy and Immunology ROS: negative  Hematological and Lymphatic ROS: negative  Endocrine ROS: negative  Respiratory ROS: no cough, shortness of breath, or wheezing  Cardiovascular ROS: no chest pain or dyspnea on exertion  Gastrointestinal ROS: no abdominal pain, change in bowel habits, or black or bloody stools  Genito-Urinary ROS: no dysuria, trouble voiding, or hematuria  Musculoskeletal ROS: Muscle weakness, joint pains  Neurological ROS: Headaches, numbness  and tingling sensation,dizziness.  Dermatological ROS: negative        Medications reviewed:  Current Outpatient Medications   Medication Sig Dispense Refill   ??? predniSONE (DELTASONE) 5 mg tablet Take 1 tab p.o. tid x 3 days, then 1 tab p.o. bid x4 days, then 1 tab p.o qdx 3 days 20 Tablet 0   ??? oxybutynin (DITROPAN) 5 mg tablet TAKE 1 TABLET BY MOUTH TWICE DAILY 180 Tablet 1   ??? verapamil ER (CALAN-SR) 180 mg CR tablet Take 1 Tablet by mouth nightly. 90 Tablet 1   ??? ezetimibe (ZETIA) 10 mg tablet Take 1 Tablet by mouth daily. 90 Tablet 0   ??? pregabalin (LYRICA) 150 mg capsule Take 1 Capsule by mouth two (2) times a day. Max Daily Amount: 300 mg. 60 Capsule 5   ??? folic acid (FOLVITE) 1 mg tablet Take 1 Tab by mouth daily. 90 Tab 3   ??? alendronate (FOSAMAX) 70 mg tablet TAKE 1 TABLET BY MOUTH EVERY 7 DAYS 4 Tab 5   ??? acetaminophen (TYLENOL) 500 mg tablet Take 1,000 mg by mouth every six (6) hours as needed for Pain.     ??? isosorbide dinitrate (ISORDIL) 10 mg tablet TAKE 1 TABLET BY MOUTH TWICE DAILY 180 Tab 0   ??? polyethylene glycol (MIRALAX) 17 gram/dose powder TAKE 1 CAPFUL PO ONCE A DAY  11   ??? aspirin 81 mg chewable tablet Take 81 mg by mouth daily.     ??? lidocaine (LIDODERM)  5 % APPLY 1 PATCH TO THE AFFECTED AREA FOR 12 HOURS, THEN REMOVE FOR 12 HOURS (Patient not taking: Reported on 02/13/2021) 90 Patch 0   ??? cilostazoL (PLETAL) 100 mg tablet TAKE 1 TABLET BY MOUTH TWICE DAILY (Patient not taking: Reported on 02/13/2021) 180 Tablet 0   ??? omeprazole (PRILOSEC) 40 mg capsule TAKE ONE CAPSULE BY MOUTH EVERY DAY (Patient not taking: Reported on 11/21/2020) 90 Capsule 3   ??? lidocaine-prilocaine (EMLA) topical cream Apply to the leg twice a day (Patient not taking: Reported on 11/21/2020) 30 g 3   ??? estradiol (VAGIFEM) 10 mcg tab vaginal tablet INSERT 1 T VAGINALLY TWICE PER WEEK (Patient not taking: Reported on 02/13/2021)  3        Objective:   Vitals:  Vitals:    02/13/21 0909   BP: 122/72   Pulse: 86   Resp: 16    Temp: 98.1 ??F (36.7 ??C)   TempSrc: Temporal   SpO2: 97%   Weight: 135 lb 12.8 oz (61.6 kg)   Height: 5\' 3"  (1.6 m)   PainSc:   0 - No pain               Lab Data Reviewed:  Lab Results   Component Value Date/Time    WBC 8.7 07/12/2020 04:02 AM    HCT 31.6 (L) 07/12/2020 04:02 AM    HGB 10.1 (L) 07/12/2020 04:02 AM    PLATELET 221 07/12/2020 04:02 AM       Lab Results   Component Value Date/Time    Sodium 142 07/12/2020 04:02 AM    Potassium 3.8 07/12/2020 04:02 AM    Chloride 112 (H) 07/12/2020 04:02 AM    CO2 24 07/12/2020 04:02 AM    Glucose 127 (H) 07/12/2020 04:02 AM    BUN 23 (H) 07/12/2020 04:02 AM    Creatinine 0.91 07/12/2020 04:02 AM    Calcium 8.8 07/12/2020 04:02 AM       No components found for: TROPQUANT    No results found for: ANA      No results found for: HBA1C, HBA1CPOC, HBA1CEXT     Lab Results   Component Value Date/Time    Vitamin B12 668 09/04/2018 01:07 PM       No results found for: ANA, Phillip Heal, XBANA    Lab Results   Component Value Date/Time    Cholesterol, total 181 04/13/2020 11:14 AM    HDL Cholesterol 69 04/13/2020 11:14 AM    LDL, calculated 100 04/13/2020 11:14 AM    VLDL, calculated 12 04/13/2020 11:14 AM    Triglyceride 60 04/13/2020 11:14 AM    CHOL/HDL Ratio 2.6 04/13/2020 11:14 AM         CT Results (recent):  Results from Hospital Encounter encounter on 07/10/20    CT HEAD WO CONT    Narrative  EXAM: CT HEAD WO CONT    INDICATION: headache    COMPARISON: May 23, 2017.    CONTRAST: None.    TECHNIQUE: Unenhanced CT of the head was performed using 5 mm images. Brain and  bone windows were generated. Coronal and sagittal reformats. CT dose reduction  was achieved through use of a standardized protocol tailored for this  examination and automatic exposure control for dose modulation.    FINDINGS:  The ventricles and sulci are enlarged. There are calcifications in the left  frontal lobe which are stable. There is no significant white matter disease.  Pituitary adenoma  again seen. The  basilar cisterns are open. No CT evidence of  acute infarct.    The bone windows demonstrate no abnormalities. The visualized portions of the  paranasal sinuses and mastoid air cells are clear.    Impression  No acute findings.      MRI Results (recent):      IR Results (recent):  No results found for this or any previous visit.      VAS/US Results (recent):  Results from Hospital Encounter encounter on 04/24/16    DUPLEX LOWER EXT VENOUS RIGHT    Narrative  HISTORY: Right leg edema and pain.    PROCEDURE: Multiple real time duplex and color Doppler interrogation of the  right lower extremity deep venous system were performed.    FINDINGS: The veins are compressible and demonstrate normal phasicity and  augmentation. No filling defects are identified to suggest deep venous  thrombosis. The veins of the groin and thigh and knee and calf were evaluated.    However, there is noted to be a right suprapatellar joint effusion with debris  measuring 6.1 x 1.6 cm.    Impression  IMPRESSION:    No evidence of deep venous thrombosis.    Left suprapatellar joint effusion with debris.      PHYSICAL EXAM:  General:    Alert, cooperative, no distress, appears stated age.     Head:   Normocephalic, without obvious abnormality, atraumatic.  Eyes:   Conjunctivae/corneas clear.  PERRLA  Nose:  Nares normal. No drainage or sinus tenderness.  Throat:    Lips, mucosa, and tongue normal.  No Thrush  Neck:  Supple, symmetrical,  no adenopathy, thyroid: non tender    no carotid bruit and no JVD.  Back:    Symmetric,  No CVA tenderness.  Lungs:   Clear to auscultation bilaterally.  No Wheezing or Rhonchi. No rales.  Chest wall:  No tenderness or deformity. No Accessory muscle use.  Heart:   Regular rate and rhythm,  no murmur, rub or gallop.  Abdomen:   Soft, non-tender. Not distended.  Bowel sounds normal. No masses  Extremities: Extremities normal, atraumatic, No cyanosis.  No edema. No clubbing  Skin:     Texture, turgor  normal. No rashes or lesions.  Not Jaundiced  Lymph nodes: Cervical, supraclavicular normal.  Psych:  Good insight.  Not depressed.  Not anxious or agitated.      NEUROLOGICAL EXAM:  Appearance:  The patient is well developed, well nourished, provides a coherent history and is in no acute distress.   Mental Status: Oriented to time, place and person. Mood and affect appropriate.   Cranial Nerves:   Intact visual fields. Fundi are benign. PERLA, EOM's full, no nystagmus, no ptosis. Facial sensation is normal. Corneal reflexes are intact. Facial movement is symmetric. Hearing is normal bilaterally. Palate is midline with normal sternocleidomastoid and trapezius muscles are normal. Tongue is midline.   Motor:  5/5 strength in upper extremity proximal and distal, 5/5 proximal lower extremity and bilateral foot drop. Normal bulk and tone. No fasciculations.   Reflexes:   Deep tendon reflexes 1+/4 and symmetrical.   Sensory:    Dysesthesia to touch, pinprick and vibration.   Gait:   Unsteady gait.  Ambulates with cane   Tremor:   No tremor noted.   Cerebellar:  No cerebellar signs present.   Neurovascular:  Normal heart sounds and regular rhythm, peripheral pulses intact, and no carotid bruits.         Assesment  1.  Polyneuropathy  Lyrica    2. Sensory neuropathy  Lyrica    3. Meningioma (HCC)  Stable    4. Pituitary macroadenoma (HCC)  Stable  - CT HEAD WO CONT; Future    5. Headache disorder  Prednisone taper  - CT HEAD WO CONT; Future    6. Gait disorder  Intermittent physical therapy  - CT HEAD WO CONT; Future    7. Fall, initial encounter  Intermittent physical therapy    ___________________________________________________  PLAN: Medication plan discussed with patient and niece      ICD-10-CM ICD-9-CM    1. Polyneuropathy  G62.9 356.9    2. Sensory neuropathy  G62.9 356.9    3. Meningioma (HCC)  D32.9 225.2    4. Pituitary macroadenoma (HCC)  D35.2 227.3 CT HEAD WO CONT   5. Headache disorder  R51.9 784.0 CT HEAD WO  CONT   6. Gait disorder  R26.9 781.2 CT HEAD WO CONT   7. Fall, initial encounter  W19.Merril Abbe V672.0      Follow-up and Dispositions    ?? Return in about 6 months (around 08/16/2021).               ___________________________________________________    Attending Physician: Nelwyn Salisbury, MD

## 2021-02-13 NOTE — Progress Notes (Signed)
 1. Have you been to the ER, urgent care clinic since your last visit?  Hospitalized since your last visit?  Seen on 01/08/21 for fall    2. Have you seen or consulted any other health care providers outside of the [facility] since your last visit?  Include any pap smears or colon screening.   No.      Chief Complaint   Patient presents with   . Follow-up     6 month on neuropathy- burning in both legs, feet- numbness in 3 fingers on rt hand

## 2021-02-15 ENCOUNTER — Encounter

## 2021-02-15 ENCOUNTER — Ambulatory Visit: Payer: MEDICARE | Attending: Internal Medicine | Primary: Internal Medicine

## 2021-02-15 MED ORDER — PREGABALIN 150 MG CAP
150 mg | ORAL_CAPSULE | Freq: Two times a day (BID) | ORAL | 6 refills | Status: DC
Start: 2021-02-15 — End: 2021-03-07

## 2021-02-15 NOTE — Telephone Encounter (Signed)
Sent Lyrica to Dr.Aralu for refill. He already fills this medication.

## 2021-02-15 NOTE — Telephone Encounter (Signed)
Pt's podiatrist will no longer prescribe Lyrica for pt. Podiatrist says neuro will need to take over script. Kimberly [name] states pt is 8/10 on pain scale today. Please call [phone]

## 2021-02-15 NOTE — Telephone Encounter (Signed)
Daughter would like to get a refill on pt's gabapentin. Patient could not do virtual visit due to daughter would not be there due to time of visit. Thanks!

## 2021-02-20 NOTE — Telephone Encounter (Signed)
Received fax from pharmacy asking for clarification on Lyrica Rx. She advised pt picked up the medication on 02/15/21,so we can disregard that fax.

## 2021-02-22 NOTE — Telephone Encounter (Signed)
Left message on Identified VM to call office at [phone]

## 2021-02-22 NOTE — Telephone Encounter (Signed)
Mrs. Kimberly [name], Pt's Nurse, called to see if Pt left her insurance card here on 7/11. Please advise. [phone]

## 2021-02-24 NOTE — Telephone Encounter (Signed)
Called and left message on  [name]- on [facility]- voice message that we do not have her insurance card here at the office. Advised to call if needed.

## 2021-03-02 NOTE — Telephone Encounter (Signed)
Patients caregiver called to request PA prednisone

## 2021-03-03 ENCOUNTER — Inpatient Hospital Stay: Admit: 2021-03-03 | Payer: MEDICARE | Attending: Neurology | Primary: Internal Medicine

## 2021-03-03 DIAGNOSIS — D352 Benign neoplasm of pituitary gland: Secondary | ICD-10-CM

## 2021-03-06 NOTE — Telephone Encounter (Signed)
Re: Prednisone    Patient picked up first dose on 02/13/21.  It was written with no refills on it.  I called [facility] - it does not require a P.A. - just needs refill.     Please contact patient of status.

## 2021-03-06 NOTE — Telephone Encounter (Signed)
Patient's Nurse Aide called back for an update on the PA for Prednisone. Stated Pt has been out of this medication for a couple days and is in a lot of pain. Pt's aide asked for some advice on what can be done while they wait for PA so Pt will not have to be in pain. [phone]

## 2021-03-06 NOTE — Telephone Encounter (Signed)
Called pt, spoke to Ms.Griffin-the pt's aide-she advised that she called in and left message asking for refill on the Prednisone due to pt's neuropathy is hurting her very badly. I advised I will send message to the dr and call back once I know something. Ms.Griffin verbalized understanding.

## 2021-03-07 ENCOUNTER — Encounter

## 2021-03-07 MED ORDER — LIDOCAINE 5 % (700 MG/PATCH) ADHESIVE PATCH
5 % | MEDICATED_PATCH | CUTANEOUS | 0 refills | Status: AC
Start: 2021-03-07 — End: 2021-08-14

## 2021-03-07 MED ORDER — PREDNISONE 5 MG TAB
5 mg | ORAL_TABLET | ORAL | 0 refills | Status: DC
Start: 2021-03-07 — End: 2021-04-04

## 2021-03-07 MED ORDER — PREGABALIN 150 MG CAP
150 mg | ORAL_CAPSULE | Freq: Two times a day (BID) | ORAL | 6 refills | Status: AC
Start: 2021-03-07 — End: 2021-08-14

## 2021-03-07 NOTE — Telephone Encounter (Signed)
See other encounter regarding this info. It has been sent to Dr.Aralu. will close this encounter out.

## 2021-03-08 NOTE — Telephone Encounter (Signed)
Message  Received: Today  Aralu, Cletus C, MD sent to Virgie Dad, LPN  Caller: Unspecified (2 days ago,  1:28 PM)  Medication called into the pharmacy       Called pt, Ms.Griffin-pt's caregiver answered the phone and stated the she received the Prednisone. She is taking med now. Ms.Thatch is ok.

## 2021-03-09 ENCOUNTER — Ambulatory Visit: Payer: MEDICARE | Primary: Internal Medicine

## 2021-03-23 ENCOUNTER — Inpatient Hospital Stay: Admit: 2021-03-23 | Payer: MEDICARE | Attending: Surgery | Primary: Internal Medicine

## 2021-03-23 DIAGNOSIS — C50412 Malignant neoplasm of upper-outer quadrant of left female breast: Secondary | ICD-10-CM

## 2021-04-04 MED ORDER — PREDNISONE 5 MG TAB
5 mg | ORAL_TABLET | ORAL | 0 refills | Status: DC
Start: 2021-04-04 — End: 2021-05-09

## 2021-04-21 MED ORDER — DOCUSATE SODIUM 100 MG CAP
100 mg | ORAL_CAPSULE | Freq: Two times a day (BID) | ORAL | 11 refills | Status: AC
Start: 2021-04-21 — End: 2021-07-19

## 2021-05-10 MED ORDER — PREDNISONE 5 MG TAB
5 mg | ORAL_TABLET | ORAL | 0 refills | Status: DC
Start: 2021-05-10 — End: 2021-05-23

## 2021-05-12 ENCOUNTER — Ambulatory Visit: Admit: 2021-05-12 | Discharge: 2021-05-12 | Payer: MEDICARE | Attending: Family Medicine | Primary: Internal Medicine

## 2021-05-12 ENCOUNTER — Ambulatory Visit: Attending: Family Medicine | Primary: Family Medicine

## 2021-05-12 DIAGNOSIS — N952 Postmenopausal atrophic vaginitis: Secondary | ICD-10-CM

## 2021-05-12 MED ORDER — ESTRADIOL 10 MCG VAGINAL TAB
10 mcg | ORAL_TABLET | VAGINAL | 3 refills | Status: DC
Start: 2021-05-12 — End: 2021-08-18

## 2021-05-12 NOTE — Progress Notes (Signed)
Progress Notes by Clearence Ped, MD at 05/12/21 1100                Author: Clearence Ped, MD  Service: --  Author Type: Physician       Filed: 05/12/21 1402  Encounter Date: 05/12/2021  Status: Signed          Editor: Doyce Para Alphonzo Dublin, MD (Physician)               [facility]   [phone] [place] [address]   [phone]      C/C: Medication management       HPI:      Kimberly [name] is a 87 y.o.  BLACK/AFR[place] AMER[place]  female who presents to clinic today for evaluation of the issues listed above. Pt is new to the practice.      Previous PCP: Dr. Elder Negus         Subjective;      Patient presenting for initial office visit with me today. Patient here with the nurse, [place] (home nurse). Pt received home health nurse 5days/week. The Niece then takes  over the patient's care once the nurses live.       Vaginal atrophy: pt requesting refill of vagifem which she was taking for vaginal atrophy. Has been on this for several years. States that she can feel the dryness since she is without the meds.      Only meds for now include; pregalin, prednisone, ASA, tylenol      Pt denies any  fever, chill, chest pain, SOB, abdominal pain, n/v/d, HA or dizziness.       Other Health Habits and social history:   Her only daughter passed away.      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      Allergies- reviewed:      Allergies        Allergen  Reactions         ?  Pcn [Penicillins]  Hives and Myalgia         ?  Sulfa (Sulfonamide Antibiotics)  Unknown (comments)           Past Medical History- reviewed:     Past Medical History:        Diagnosis  Date         ?  Adverse effect of anesthesia            slow to wake         ?  Anginal pain ([place])       ?  Arthritis       ?  Breast cancer ([place])  03/10/2018           left lumpectomy          ?  Cancer ([place])  01/2018          breast         ?  Chronic pain            neuropathy         ?  Constipation       ?  GERD (gastroesophageal reflux disease)       ?  Hearing loss       ?  High cholesterol       ?  Hypertension       ?  Incontinence       ?  Joint pain       ?  Memory disorder       ?  Muscle pain       ?  Osteoporosis       ?  Ringing in the ears       ?  Snoring           ?  Visual disturbance             Family History - reviewed:     Family History         Problem  Relation  Age of Onset          ?  Breast Cancer  Mother  82     ?  No Known Problems  Father       ?  Breast Cancer  Sister  91     ?  Breast Cancer  Daughter  46     ?  Cancer  Daughter       ?  Dementia  Brother            ?  Dementia  Sister             Social History - reviewed:     Social History          Socioeconomic History         ?  Marital status:  WIDOWED              Spouse name:  Not on file         ?  Number of children:  Not on file     ?  Years of education:  Not on file     ?  Highest education level:  Not on file       Occupational History        ?  Not on file       Tobacco Use         ?  Smoking status:  Never     ?  Smokeless tobacco:  Never       Vaping Use         ?  Vaping Use:  Never used       Substance and Sexual Activity         ?  Alcohol use:  Not Currently             Comment: rarely         ?  Drug use:  No     ?  Sexual activity:  Never        Other Topics  Concern        ?  Not on file       Social History Narrative        ?  Not on file          Social Determinants of Health          Financial Resource Strain: Not on file     Food Insecurity: Not on file     Transportation Needs: Not on file     Physical Activity: Not on file     Stress: Not on file     Social Connections: Not on file     Intimate Partner Violence: Not on file       Housing Stability: Not on file           Depression screening:  3 most recent PHQ Screens  02/13/2021        PHQ Not Done  -     Little interest or pleasure  in doing things  Not at all     Feeling down, depressed, irritable, or hopeless  Not at all     Total Score PHQ 2  0     Trouble falling or staying asleep, or sleeping too much  -     Feeling tired or having little energy  -     Poor appetite, weight loss, or overeating  -     Feeling bad about yourself - or that you are a failure or have let yourself or your family down  -     Trouble concentrating on things such as school, work, reading, or watching TV  -     Moving or speaking so slowly that other people could have noticed; or the opposite being so fidgety that others notice  -     Thoughts of being better off dead, or hurting yourself in some way  -     PHQ 9 Score  -        How difficult have these problems made it for you to do your work, take care of your home and get along with others  -              Review of systems:       A comprehensive review of systems was negative except for that written in the History of Present Illness.          Visit Vitals      BP  (!) 143/78 (BP 1 Location: Right arm, BP Patient Position: Sitting, BP Cuff Size: Adult)     Pulse  82     Temp  98.2 ??F (36.8 ??C) (Oral)     Resp  16     Ht  5\' 3"  (1.6 m)     Wt  133 lb (60.3 kg)     LMP   (LMP Unknown)     SpO2  97%        BMI  23.56 kg/m??           General: Alert and oriented, in no acute distress. Well nourished.    EYE: PERRL. Sclera and conjuctival clear. Extraocular movements intact.   EARS: External normal, canals clear, tympanic membranes normal.    NOSE: Mucosa healthy without drainage or ulceration.   OROPHARYNX: No suspicious lesions, normal dentition, pharynx, tongue and tonsils normal.   NECK: Supple; no masses; thyroid normal.   LUNGS: Respirations unlabored; clear to auscultation bilaterally.   CARDIOVASCULAR: Regular, rate, and rhythm without murmurs, gallops or rubs.   ABDOMEN: Soft; nontender; nondistended; normoactive bowel sounds; no masses or organomegaly.   MUSCULOSKELETAL: FROM in all extremities      EXT: No  edema. Neurovascularlly intact. Walks with a cane   SKIN: No rash. No suspicious lesions or moles.   Neuro: Mental Status: Pt is alert and oriented to person, place, and time.           Assessment/Plan                  ICD-10-CM  ICD-9-CM             1.  Vaginal atrophy   N95.2  627.3  estradioL (VAGIFEM) 10 mcg tab vaginal tablet  2.  PVD (peripheral vascular disease) (HCC)   I73.9  443.9                    3.  Pituitary mass (HCC)   E23.6  253.8                    4.  Stage 3a chronic kidney disease (HCC)   N18.31  585.3                    1. Vaginal atrophy   - estradioL (VAGIFEM) 10 mcg tab vaginal tablet; INSERT 1 TABLET VAGINALLY TWICE PER WEEK        2. PVD (peripheral vascular disease) ([place])   stable      3. Pituitary mass ([place])   Stable based on CT head (02/2021)   Follows with neurology      4. Stage 3a chronic kidney disease (HCC)   Stable.   Recommend avoiding nephrotoxic agents.            Follow up: 3 months or as needed      I have discussed the diagnosis with the patient and the intended plan as seen in the above orders.  The patient has received an after-visit summary and questions were answered concerning future plans.  I have discussed medication side effects and warnings  with the patient as well. Informed patient to return to the office if new symptoms arise.         Signed By:  Clearence Ped, MD           May 12, 2021

## 2021-05-12 NOTE — Progress Notes (Signed)
Progress  Notes by Fleming, [place] L at 05/12/21 1100                Author: Fleming, [place] L  Service: --  Author Type: Medical Assistant       Filed: 05/12/21 1402  Encounter Date: 05/12/2021  Status: Signed          Editor: Fleming, [place] L (Medical Assistant)               Name and DOB Verified.       Accompanied by : Her Nurse [place] verified       Previous PCP: Dr. Elder Negus         Chief Complaint       Patient presents with        ?  New Patient        ?  Establish Care           1. Have you been to the ER, urgent care clinic since your last visit?  Hospitalized since your last visit?No      2. Have you seen or consulted any other health care providers outside of the [facility] since your last visit?  Include any pap smears or colon screening.       Yes   04/05/2021   [facility]   Malignant neoplasm of upper-outer quadrant of left breast in female.           Health Maintenance Due        Topic  Date Due         ?  Shingrix Vaccine Age 39> (1 of 2)  Never done         ?  Flu Vaccine (1)  03/06/2021

## 2021-05-23 ENCOUNTER — Ambulatory Visit: Admit: 2021-05-23 | Discharge: 2021-05-23 | Payer: MEDICARE | Attending: Surgery | Primary: Family Medicine

## 2021-05-23 ENCOUNTER — Ambulatory Visit: Attending: Surgery | Primary: Family Medicine

## 2021-05-23 DIAGNOSIS — C50412 Malignant neoplasm of upper-outer quadrant of left female breast: Secondary | ICD-10-CM

## 2021-05-23 NOTE — Progress Notes (Signed)
 Identified pt with two pt identifiers (name and DOB). Reviewed chart in preparation for visit and have obtained necessary documentation.    Kimberly [name] is a 87 y.o. female  Chief Complaint   Patient presents with    Breast Cancer     60m breast check      Visit Vitals  BP 126/69 (BP 1 Location: Right arm, BP Patient Position: Sitting, BP Cuff Size: Small adult)   Pulse 81   Temp 97.2 F (36.2 C) (Temporal)   Resp 20   Ht 5' 3 (1.6 m)   Wt 63 kg (139 lb)   LMP  (LMP Unknown)   SpO2 97%   BMI 24.62 kg/m       1. Have you been to the ER, urgent care clinic since your last visit?  Hospitalized since your last visit?No    2. Have you seen or consulted any other health care providers outside of the [facility] since your last visit?  Include any pap smears or colon screening. No    Pt is here with her caregiver.

## 2021-05-23 NOTE — Progress Notes (Signed)
Progress Notes by Luellen Pucker, MD at 05/23/21 1040                Author: Luellen Pucker, MD  Service: --  Author Type: Physician       Filed: 05/23/21 1128  Encounter Date: 05/23/2021  Status: Signed          Editor: Luellen Pucker, MD (Physician)               HISTORY OF PRESENT ILLNESS   Kimberly [name] is a 87 y.o.  female who comes in for follow up by Clearence Ped, MD for  breast cancer    Follow-up   Associated symptoms include headaches. Pertinent negatives include no chest pain,  no abdominal pain and no shortness of breath.    Breast Problem   Associated  symptoms include headaches. Pertinent negatives include no chest pain, no abdominal pain and no shortness of breath.    Breast Cancer   Associated symptoms include headaches. Pertinent negatives include no chest pain,  no abdominal pain and no shortness of breath. she went for screening mammogram and  was noted to have a density in the UOQ of the left breast.  Subsequently she had an [place] confirming a 2.0 x 1.8 x 1.6 cm in the 0100 position 6 cm from nipple.   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  She had not felt a lump, skin changes, nipple changes or drainage, unexplained weight loss or bone pain.  She had menarche at 39, only pregnancy at 43, menopause at 75 and did not take HRT.  Her MGM, M, D, and sister all died from metastatic  breast cancer.   She underwent a left lumpectomy for a T2NxMx IDC 03/10/2018.  She received adjuvant WBRT [facility]).  She had a bilateral 3D dx mammogram 03/23/2021 was BIRADS 2.        Past Medical History:        Diagnosis  Date         ?  Adverse effect of anesthesia            slow to wake         ?  Anginal pain ([place])       ?  Arthritis       ?  Breast cancer ([place])  03/10/2018           left lumpectomy         ?  Cancer ([place])  01/2018          breast         ?  Chronic pain            neuropathy         ?   Constipation       ?  GERD (gastroesophageal reflux disease)       ?  Hearing loss       ?  High cholesterol       ?  Hypertension       ?  Incontinence       ?  Joint pain       ?  Memory disorder       ?  Muscle pain       ?  Osteoporosis       ?  Ringing in the ears       ?  Snoring           ?  Visual disturbance            Past Surgical History:         Procedure  Laterality  Date          ?  HX BREAST LUMPECTOMY  Left  03/10/2018          LEFT BREAST LUMPECTOMY WITH ULTRASOUND GUIDANCE performed by Luellen Pucker, MD at [facility]          ?  HX HYSTERECTOMY         ?  HX ORTHOPAEDIC              epidural pain mtg - pt unsure          ?  HX OTHER SURGICAL  Right  10/15/2016          ligament correction right foot          ?  HX OTHER SURGICAL    11/26/2016          Pituitary tumor removal          Family History         Problem  Relation  Age of Onset          ?  Breast Cancer  Mother  37     ?  No Known Problems  Father       ?  Breast Cancer  Sister  3     ?  Breast Cancer  Daughter  9     ?  Cancer  Daughter       ?  Dementia  Brother            ?  Dementia  Sister            Social History          Tobacco Use         ?  Smoking status:  Never     ?  Smokeless tobacco:  Never       Vaping Use         ?  Vaping Use:  Never used       Substance Use Topics         ?  Alcohol use:  Not Currently             Comment: rarely         ?  Drug use:  No          Current Outpatient Medications        Medication  Sig         ?  estradioL (VAGIFEM) 10 mcg tab vaginal tablet  INSERT 1 TABLET VAGINALLY TWICE PER WEEK     ?  docusate sodium (COLACE) 100 mg capsule  Take 1 Capsule by mouth two (2) times a day for 90 days.     ?  lidocaine (LIDODERM) 5 %  APPLY 1 PATCH TO THE AFFECTED AREA FOR 12 HOURS, THEN REMOVE FOR 12 HOURS     ?  pregabalin (LYRICA) 150 mg capsule  Take 1 Capsule by mouth two (2) times a day. Max Daily Amount: 347 mg.     ?  folic acid (FOLVITE) 1 mg tablet  Take 1 Tab by mouth daily.      ?  acetaminophen (TYLENOL) 500 mg tablet  Take 1,000 mg by mouth every six (6) hours as needed for Pain.     ?  polyethylene glycol (MIRALAX) 17 gram/dose powder  TAKE 1 CAPFUL PO ONCE A DAY         ?  aspirin 81 mg chewable tablet  Take 81 mg by mouth daily.          No current facility-administered medications for this visit.          Allergies        Allergen  Reactions         ?  Pcn [Penicillins]  Hives and Myalgia         ?  Sulfa (Sulfonamide Antibiotics)  Unknown (comments)              Review of Systems    Constitutional:  Positive for malaise/fatigue. Negative for chills, diaphoresis, fever and weight loss.    HENT:  Positive for hearing loss. Negative for sore throat.     Eyes:  Negative for blurred vision and discharge.         Vision loss    Respiratory:  Negative for cough, shortness of breath and wheezing.     Cardiovascular:  Positive for leg swelling. Negative for chest pain, palpitations, orthopnea and claudication.    Gastrointestinal:  Positive for nausea. Negative for abdominal pain, constipation, diarrhea, heartburn, melena and vomiting.    Genitourinary:  Positive for dysuria. Negative for flank pain, frequency and hematuria.    Musculoskeletal:  Positive for joint pain and myalgias . Negative for back pain and neck pain.    Skin:  Negative for rash.    Neurological:  Positive for headaches. Negative for dizziness, speech change, focal weakness, seizures, loss of consciousness and weakness.    Endo/Heme/Allergies:  Does not bruise/bleed easily.    Psychiatric/Behavioral:  Negative for depression and memory loss.     Visit Vitals      BP  126/69 (BP 1 Location: Right arm, BP Patient Position: Sitting, BP Cuff Size: Small adult)     Pulse  81     Temp  97.2 ??F (36.2 ??C) (Temporal)     Resp  20     Ht  5\' 3"  (1.6 m)     Wt  63 kg (139 lb)     LMP   (LMP Unknown)     SpO2  97%        BMI  24.62 kg/m??           Physical Exam   Exam conducted with a chaperone present.    Constitutional:         General: She is not in acute distress.      Appearance: She is well-developed. She is not diaphoretic.    HENT:       Head: Normocephalic and atraumatic.       Mouth/Throat:       Pharynx: No oropharyngeal exudate.    Eyes:       General: No scleral icterus.      Conjunctiva/sclera: Conjunctivae normal.       Pupils: Pupils are equal, round, and reactive to light.    Neck:       Thyroid: No thyromegaly.       Vascular: No JVD.       Trachea: No tracheal deviation.    Cardiovascular:       Rate and Rhythm: Normal rate and regular rhythm.       Heart sounds: No murmur heard.     No friction rub. No gallop.    Pulmonary:       Effort: Pulmonary effort is  normal. No respiratory distress.       Breath sounds: Normal breath sounds. No wheezing or rales.    Chest:    Breasts:      Breasts are symmetrical.       Right: Normal. No inverted nipple, mass, nipple discharge, skin change or tenderness.       Left: Mass (induration at lumpectomy site) and  skin change (radiation changes ) present. No inverted nipple, nipple discharge or tenderness.            Abdominal:       General: Bowel sounds are normal. There is no distension.       Palpations: Abdomen is soft. There is no mass.       Tenderness: There is no abdominal tenderness. There is no guarding or rebound.     Musculoskeletal:          General: Normal range of motion.       Cervical back: Normal range of motion and neck supple.     Lymphadenopathy:       Cervical: No cervical adenopathy.       Upper Body:       Right upper body: No supraclavicular or axillary adenopathy.       Left upper body: No supraclavicular or axillary adenopathy.    Skin:      General: Skin is warm and dry.       Coloration: Skin is not pale.       Findings: No erythema or rash.    Neurological:       Mental Status: She is alert and oriented to person, place, and time.       Cranial Nerves: No cranial nerve deficit.       Gait: Gait abnormal (uses cane and has leg braces for balance).        Comments: Balance issues    Psychiatric:          Behavior: Behavior normal.          Thought Content: Thought content normal.          Judgment: Judgment normal.             ASSESSMENT and PLAN   1.  Left breast cancer early stage, Clinically stage 2A (T2N0M0). Triple negative Dx 01/2018.   NED at 5 years and 4 months   2.  Strong family hx breast cancer      3.  Allergy to PCN with hives and upset stomach.  Ok for Ancef   4.  Essential hypertension.  On rx   5.  COVID-19.  Got first dose of vaccine   6.  Social.   Here with with aide today   Lives with niece and has an aide that comes to the house      Bilateral 3D dx mammogram 03/2022 if health is still good and she desires      RTC 6 months         Luellen Pucker, MD FACS

## 2021-05-30 ENCOUNTER — Encounter: Attending: Internal Medicine | Primary: Family Medicine

## 2021-07-02 ENCOUNTER — Emergency Department: Admit: 2021-07-02 | Payer: MEDICARE | Primary: Family Medicine

## 2021-07-02 ENCOUNTER — Inpatient Hospital Stay
Admit: 2021-07-02 | Discharge: 2021-07-02 | Disposition: A | Discharge status: Home / Self Care | Payer: MEDICARE | Attending: Emergency Medicine

## 2021-07-02 DIAGNOSIS — J208 Acute bronchitis due to other specified organisms: Secondary | ICD-10-CM

## 2021-07-02 DIAGNOSIS — B9689 Other specified bacterial agents as the cause of diseases classified elsewhere: Secondary | ICD-10-CM

## 2021-07-02 LAB — COMPREHENSIVE METABOLIC PANEL
ALT: 22 U/L (ref 12–78)
AST: 17 U/L (ref 15–37)
Albumin/Globulin Ratio: 0.5 — ABNORMAL LOW (ref 1.1–2.2)
Albumin: 2.7 g/dL — ABNORMAL LOW (ref 3.5–5.0)
Alkaline Phosphatase: 90 U/L (ref 45–117)
Anion Gap: 7 mmol/L (ref 5–15)
BUN: 12 MG/DL (ref 6–20)
Bun/Cre Ratio: 15 (ref 12–20)
CO2: 25 mmol/L (ref 21–32)
Calcium: 9.6 MG/DL (ref 8.5–10.1)
Chloride: 108 mmol/L (ref 97–108)
Creatinine: 0.8 MG/DL (ref 0.55–1.02)
ESTIMATED GLOMERULAR FILTRATION RATE: >60 mL/min/{1.73_m2} (ref >60)
Globulin: 5 g/dL — ABNORMAL HIGH (ref 2.0–4.0)
Glucose: 98 mg/dL (ref 65–100)
Potassium: 4.5 mmol/L (ref 3.5–5.1)
Sodium: 140 mmol/L (ref 136–145)
Total Bilirubin: 0.4 MG/DL (ref 0.2–1.0)
Total Protein: 7.7 g/dL (ref 6.4–8.2)

## 2021-07-02 LAB — INFLUENZA A+B VIRAL AGS
Flu A Antigen: NEGATIVE
Influenza A Antigen: NEGATIVE
Influenza B Antigen: NEGATIVE
Influenza B Antigen: NEGATIVE

## 2021-07-02 LAB — CBC WITH AUTO DIFFERENTIAL
Basophils %: 1 % (ref 0–1)
Basophils Absolute: 0.1 10*3/uL (ref 0.0–0.1)
Eosinophils %: 1 % (ref 0–7)
Eosinophils Absolute: 0.1 10*3/uL (ref 0.0–0.4)
Granulocyte Absolute Count: 0.1 10*3/uL — ABNORMAL HIGH (ref 0.00–0.04)
Hematocrit: 36.6 % (ref 35.0–47.0)
Hemoglobin: 11.6 g/dL (ref 11.5–16.0)
Immature Granulocytes: 1 % — ABNORMAL HIGH (ref 0.0–0.5)
Lymphocytes %: 12 % (ref 12–49)
Lymphocytes Absolute: 0.7 10*3/uL — ABNORMAL LOW (ref 0.8–3.5)
MCH: 22.6 PG — ABNORMAL LOW (ref 26.0–34.0)
MCHC: 31.7 g/dL (ref 30.0–36.5)
MCV: 71.3 FL — ABNORMAL LOW (ref 80.0–99.0)
MPV: 10.8 FL (ref 8.9–12.9)
Monocytes %: 11 % (ref 5–13)
Monocytes Absolute: 0.7 10*3/uL (ref 0.0–1.0)
NRBC Absolute: 0 10*3/uL (ref 0.00–0.01)
Neutrophils %: 74 % (ref 32–75)
Neutrophils Absolute: 4.5 10*3/uL (ref 1.8–8.0)
Nucleated RBCs: 0 PER 100 WBC
Platelets: 356 10*3/uL (ref 150–400)
RBC: 5.13 M/uL (ref 3.80–5.20)
RDW: 14.4 % (ref 11.5–14.5)
WBC: 6.2 10*3/uL (ref 3.6–11.0)

## 2021-07-02 LAB — EKG 12-LEAD
Atrial Rate: 84 {beats}/min
Diagnosis: NORMAL
P Axis: 55 degrees
P-R Interval: 134 ms
Q-T Interval: 378 ms
QRS Duration: 86 ms
QTc Calculation (Bazett): 446 ms
R Axis: -15 degrees
T Axis: 115 degrees
Ventricular Rate: 84 {beats}/min

## 2021-07-02 LAB — TROPONIN, HIGH SENSITIVITY
Troponin, High Sensitivity: 9 ng/L (ref 0–51)
Troponin, High Sensitivity: 8 ng/L (ref 0–51)

## 2021-07-02 LAB — LIPASE
Lipase: 226 U/L (ref 73–393)
Lipase: 226 U/L (ref 73–393)

## 2021-07-02 LAB — PROBNP, N-TERMINAL: BNP: 230 PG/ML (ref <450)

## 2021-07-02 LAB — CBC WITH AUTOMATED DIFF
ABS. BASOPHILS: 0.1 10*3/uL (ref 0.0–0.1)
ABS. EOSINOPHILS: 0.1 10*3/uL (ref 0.0–0.4)
ABS. IMM. GRANS.: 0.1 10*3/uL — ABNORMAL HIGH (ref 0.00–0.04)
ABS. LYMPHOCYTES: 0.7 10*3/uL — ABNORMAL LOW (ref 0.8–3.5)
ABS. MONOCYTES: 0.7 10*3/uL (ref 0.0–1.0)
ABS. NEUTROPHILS: 4.5 10*3/uL (ref 1.8–8.0)
ABSOLUTE NRBC: 0 10*3/uL (ref 0.00–0.01)
BASOPHILS: 1 % (ref 0–1)
EOSINOPHILS: 1 % (ref 0–7)
HCT: 36.6 % (ref 35.0–47.0)
HGB: 11.6 g/dL (ref 11.5–16.0)
IMMATURE GRANULOCYTES: 1 % — ABNORMAL HIGH (ref 0.0–0.5)
LYMPHOCYTES: 12 % (ref 12–49)
MCH: 22.6 PG — ABNORMAL LOW (ref 26.0–34.0)
MCHC: 31.7 g/dL (ref 30.0–36.5)
MCV: 71.3 FL — ABNORMAL LOW (ref 80.0–99.0)
MONOCYTES: 11 % (ref 5–13)
MPV: 10.8 FL (ref 8.9–12.9)
NEUTROPHILS: 74 % (ref 32–75)
NRBC: 0 PER 100 WBC
PLATELET: 356 10*3/uL (ref 150–400)
RBC: 5.13 M/uL (ref 3.80–5.20)
RDW: 14.4 % (ref 11.5–14.5)
WBC: 6.2 10*3/uL (ref 3.6–11.0)

## 2021-07-02 LAB — METABOLIC PANEL, COMPREHENSIVE
A-G Ratio: 0.5 — ABNORMAL LOW (ref 1.1–2.2)
ALT (SGPT): 22 U/L (ref 12–78)
AST (SGOT): 17 U/L (ref 15–37)
Albumin: 2.7 g/dL — ABNORMAL LOW (ref 3.5–5.0)
Alk. phosphatase: 90 U/L (ref 45–117)
Anion gap: 7 mmol/L (ref 5–15)
BUN/Creatinine ratio: 15 (ref 12–20)
BUN: 12 MG/DL (ref 6–20)
Bilirubin, total: 0.4 MG/DL (ref 0.2–1.0)
CO2: 25 mmol/L (ref 21–32)
Calcium: 9.6 MG/DL (ref 8.5–10.1)
Chloride: 108 mmol/L (ref 97–108)
Creatinine: 0.8 MG/DL (ref 0.55–1.02)
Globulin: 5 g/dL — ABNORMAL HIGH (ref 2.0–4.0)
Glucose: 98 mg/dL (ref 65–100)
Potassium: 4.5 mmol/L (ref 3.5–5.1)
Protein, total: 7.7 g/dL (ref 6.4–8.2)
Sodium: 140 mmol/L (ref 136–145)
eGFR: >60 mL/min/{1.73_m2} (ref >60)

## 2021-07-02 LAB — TROPONIN-HIGH SENSITIVITY
Troponin-High Sensitivity: 9 ng/L (ref 0–51)
Troponin-High Sensitivity: 8 ng/L (ref 0–51)

## 2021-07-02 LAB — EKG, 12 LEAD, INITIAL
Atrial Rate: 84 {beats}/min
Calculated P Axis: 55 degrees
Calculated R Axis: -15 degrees
Calculated T Axis: 115 degrees
Diagnosis: NORMAL
P-R Interval: 134 ms
Q-T Interval: 378 ms
QRS Duration: 86 ms
QTC Calculation (Bezet): 446 ms
Ventricular Rate: 84 {beats}/min

## 2021-07-02 LAB — NT-PRO BNP: NT pro-BNP: 230 PG/ML (ref <450)

## 2021-07-02 MED ORDER — AZITHROMYCIN 250 MG TAB
250 mg | ORAL_TABLET | ORAL | 0 refills | Status: AC
Start: 2021-07-02 — End: 2021-07-07

## 2021-07-02 MED ORDER — IOPAMIDOL 76 % IV SOLN
76 % | Freq: Once | INTRAVENOUS | Status: AC
Start: 2021-07-02 — End: 2021-07-02
  Administered 2021-07-02: 19:00:00 via INTRAVENOUS

## 2021-07-02 MED ORDER — GUAIFENESIN 200 MG TAB
200 mg | ORAL_TABLET | Freq: Three times a day (TID) | ORAL | 0 refills | Status: AC | PRN
Start: 2021-07-02 — End: ?

## 2021-07-02 MED FILL — ISOVUE-370  76 % INTRAVENOUS SOLUTION: 370 mg iodine /mL (76 %) | INTRAVENOUS | Qty: 100

## 2021-07-02 NOTE — ED Provider Notes (Signed)
ED Provider Notes by Azalia Bilis, MD at 07/02/21 1305                Author: Azalia Bilis, MD  Service: Emergency Medicine  Author Type: Physician       Filed: 07/02/21 1602  Date of Service: 07/02/21 1305  Status: Signed          Editor: Azalia Bilis, MD (Physician)               EMERGENCY DEPARTMENT HISTORY AND PHYSICAL EXAM           Date: 07/02/2021   [name]        History of Presenting Illness          Chief Complaint       Patient presents with        ?  Cough             Off and on for about 1 week           ?  Chest Pain             Generalized through her entire chest        ?  Chest Congestion        ?  Nasal Congestion           History Provided By: Patient and Caregiver      HPI: Kimberly [name], 87 y.o. female presents to the ED with cc of epigastric pain.    87 year old female with the below past medical history  presents emergency department with chief plaint of epigastric and chest pain.  Patient reports symptoms began 2 weeks ago with URI symptoms with congestion and and sore throat.  Reports the symptoms have improved.  Reports yesterday developed an episode  of moderate lower chest and epigastric pain which radiated to her back.  This lasted for a few hours and then resolved without intervention.  There are no alleviating or aggravating factors.  She denies any nausea, vomiting or diarrhea.  Denies any leg  swelling.  No history of similar.  Currently has no complaints.      Caregiver reports however patient has continued to have a cough productive of yellow sputum which has worsened after initial improvement.      There are no other complaints, changes, or physical findings at this time.      PCP: Clearence Ped, MD        No current facility-administered medications on file prior to encounter.          Current Outpatient Medications on File Prior to Encounter          Medication  Sig  Dispense  Refill           ?  estradioL (VAGIFEM) 10 mcg  tab vaginal tablet  INSERT 1 TABLET VAGINALLY TWICE PER WEEK  10 Tablet  3     ?  docusate sodium (COLACE) 100 mg capsule  Take 1 Capsule by mouth two (2) times a day for 90 days.  60 Capsule  11           ?  lidocaine (LIDODERM) 5 %  APPLY 1 PATCH TO THE AFFECTED AREA FOR 12 HOURS, THEN REMOVE FOR 12 HOURS  90 Patch  0           ?  pregabalin (LYRICA) 150 mg capsule  Take 1 Capsule by mouth two (2) times a day.  Max Daily Amount: 300 mg.  60 Capsule  6     ?  folic acid (FOLVITE) 1 mg tablet  Take 1 Tab by mouth daily.  90 Tab  3     ?  acetaminophen (TYLENOL) 500 mg tablet  Take 1,000 mg by mouth every six (6) hours as needed for Pain.         ?  polyethylene glycol (MIRALAX) 17 gram/dose powder  TAKE 1 CAPFUL PO ONCE A DAY    11           ?  aspirin 81 mg chewable tablet  Take 81 mg by mouth daily.                 Past History        Past Medical History:     Past Medical History:        Diagnosis  Date         ?  Adverse effect of anesthesia            slow to wake         ?  Anginal pain ([place])       ?  Arthritis       ?  Breast cancer ([place])  03/10/2018           left lumpectomy         ?  Cancer ([place])  01/2018          breast         ?  Chronic pain            neuropathy         ?  Constipation       ?  GERD (gastroesophageal reflux disease)       ?  Hearing loss       ?  High cholesterol       ?  Hypertension       ?  Incontinence       ?  Joint pain       ?  Memory disorder       ?  Muscle pain       ?  Osteoporosis       ?  Ringing in the ears       ?  Snoring           ?  Visual disturbance             Past Surgical History:     Past Surgical History:         Procedure  Laterality  Date          ?  HX BREAST LUMPECTOMY  Left  03/10/2018          LEFT BREAST LUMPECTOMY WITH ULTRASOUND GUIDANCE performed by Luellen Pucker, MD at [facility]          ?  HX HYSTERECTOMY         ?  HX ORTHOPAEDIC              epidural pain mtg - pt unsure          ?  HX OTHER SURGICAL  Right  10/15/2016           ligament correction right foot          ?  HX OTHER SURGICAL    11/26/2016          Pituitary tumor removal  Family History:     Family History         Problem  Relation  Age of Onset          ?  Breast Cancer  Mother  64     ?  No Known Problems  Father       ?  Breast Cancer  Sister  79     ?  Breast Cancer  Daughter  42     ?  Cancer  Daughter       ?  Dementia  Brother            ?  Dementia  Sister             Social History:     Social History          Tobacco Use         ?  Smoking status:  Never     ?  Smokeless tobacco:  Never       Vaping Use         ?  Vaping Use:  Never used       Substance Use Topics         ?  Alcohol use:  Not Currently             Comment: rarely         ?  Drug use:  No           Allergies:     Allergies        Allergen  Reactions         ?  Pcn [Penicillins]  Hives and Myalgia         ?  Sulfa (Sulfonamide Antibiotics)  Unknown (comments)                Review of Systems     Review of Systems    Constitutional:  Negative for activity change, chills and fever.    HENT:  Positive for congestion. Negative for facial swelling and voice change.     Eyes:  Negative for redness.    Respiratory:  Positive for cough. Negative for shortness of breath and wheezing.     Cardiovascular:  Negative for chest pain and leg swelling.    Gastrointestinal:  Positive for abdominal pain. Negative for diarrhea, nausea and vomiting.    Genitourinary:  Negative for decreased urine volume.    Musculoskeletal:  Negative for gait problem.    Skin:  Negative for pallor and rash.    Neurological:  Negative for tremors and facial asymmetry.    Psychiatric/Behavioral:  Negative for agitation.     All other systems reviewed and are negative.        Physical Exam     Physical Exam   Vitals and nursing note reviewed.    Constitutional:        Comments: Well-appearing 87 year old female, appears younger than stated age, resting on stretcher    HENT:       Head: Normocephalic and atraumatic.     Cardiovascular:       Rate and Rhythm: Normal rate and regular rhythm.       Pulses:            Radial pulses are 2+ on the right side and 2+  on the left side.       Heart sounds: No murmur heard.     No friction rub. No gallop.    Pulmonary:  Effort: Pulmonary effort is normal.       Breath sounds: Normal breath sounds.     Abdominal:       Palpations: Abdomen is soft.       Tenderness: There is abdominal tenderness (Epigastric).     Musculoskeletal:          General: Normal range of motion.       Cervical back: Normal range of motion.       Right lower leg: No edema.       Left lower leg: No edema.    Skin:      General: Skin is warm.       Capillary Refill: Capillary refill takes less than 2 seconds.    Neurological:       General: No focal deficit present.       Mental Status: She is alert.    Psychiatric:          Mood and Affect: Mood normal.            Diagnostic Study Results        Labs -         Recent Results (from the past 12 hour(s))     CBC WITH AUTOMATED DIFF          Collection Time: 07/02/21 10:19 AM         Result  Value  Ref Range            WBC  6.2  3.6 - 11.0 K/uL       RBC  5.13  3.80 - 5.20 M/uL       HGB  11.6  11.5 - 16.0 g/dL       HCT  36.6  35.0 - 47.0 %       MCV  71.3 (L)  80.0 - 99.0 FL       MCH  22.6 (L)  26.0 - 34.0 PG       MCHC  31.7  30.0 - 36.5 g/dL       RDW  14.4  11.5 - 14.5 %       PLATELET  356  150 - 400 K/uL       MPV  10.8  8.9 - 12.9 FL       NRBC  0.0  0 PER 100 WBC       ABSOLUTE NRBC  0.00  0.00 - 0.01 K/uL       NEUTROPHILS  74  32 - 75 %       LYMPHOCYTES  12  12 - 49 %       MONOCYTES  11  5 - 13 %       EOSINOPHILS  1  0 - 7 %       BASOPHILS  1  0 - 1 %       IMMATURE GRANULOCYTES  1 (H)  0.0 - 0.5 %       ABS. NEUTROPHILS  4.5  1.8 - 8.0 K/UL       ABS. LYMPHOCYTES  0.7 (L)  0.8 - 3.5 K/UL       ABS. MONOCYTES  0.7  0.0 - 1.0 K/UL       ABS. EOSINOPHILS  0.1  0.0 - 0.4 K/UL       ABS. BASOPHILS  0.1  0.0 - 0.1 K/UL       ABS. IMM. GRANS.  0.1 (H)  0.00 -  0.04 K/UL  DF  SMEAR SCANNED          PLATELET COMMENTS  Large Platelets          RBC COMMENTS  MICROCYTOSIS   1+             RBC COMMENTS  HYPOCHROMIA   2+             EKG, 12 LEAD, INITIAL          Collection Time: 07/02/21 10:26 AM         Result  Value  Ref Range            Ventricular Rate  84  BPM       Atrial Rate  84  BPM       P-R Interval  134  ms       QRS Duration  86  ms       Q-T Interval  378  ms       QTC Calculation (Bezet)  446  ms       Calculated P Axis  55  degrees       Calculated R Axis  -15  degrees       Calculated T Axis  115  degrees       Diagnosis                 Normal sinus rhythm   Possible Anterior infarct , age undetermined   ST & T wave abnormality, consider lateral ischemia   When compared with ECG of 27-May-2017 00:31,   T wave inversion less evident in Lateral leads   Confirmed by Dennie Bible [phone]) on 07/02/2021 1:15:41 PM          METABOLIC PANEL, COMPREHENSIVE          Collection Time: 07/02/21 11:39 AM         Result  Value  Ref Range            Sodium  140  136 - 145 mmol/L       Potassium  4.5  3.5 - 5.1 mmol/L       Chloride  108  97 - 108 mmol/L       CO2  25  21 - 32 mmol/L       Anion gap  7  5 - 15 mmol/L       Glucose  98  65 - 100 mg/dL       BUN  12  6 - 20 MG/DL       Creatinine  0.80  0.55 - 1.02 MG/DL       BUN/Creatinine ratio  15  12 - 20         eGFR  >60  >60 ml/min/1.65m       Calcium  9.6  8.5 - 10.1 MG/DL       Bilirubin, total  0.4  0.2 - 1.0 MG/DL       ALT (SGPT)  22  12 - 78 U/L       AST (SGOT)  17  15 - 37 U/L       Alk. phosphatase  90  45 - 117 U/L       Protein, total  7.7  6.4 - 8.2 g/dL       Albumin  2.7 (L)  3.5 - 5.0 g/dL       Globulin  5.0 (H)  2.0 - 4.0 g/dL       A-G  Ratio  0.5 (L)  1.1 - 2.2         NT-PRO BNP          Collection Time: 07/02/21 11:39 AM         Result  Value  Ref Range            NT pro-BNP  230  <450 PG/ML       TROPONIN-HIGH SENSITIVITY          Collection Time: 07/02/21 11:39 AM         Result  Value  Ref  Range            Troponin-High Sensitivity  9  0 - 51 ng/L       LIPASE          Collection Time: 07/02/21 11:39 AM         Result  Value  Ref Range            Lipase  226  73 - 393 U/L       INFLUENZA A+B VIRAL AGS          Collection Time: 07/02/21 12:30 PM         Result  Value  Ref Range            Influenza A Antigen  Negative  NEG         Influenza B Antigen  Negative  NEG         TROPONIN-HIGH SENSITIVITY          Collection Time: 07/02/21 12:30 PM         Result  Value  Ref Range            Troponin-High Sensitivity  8  0 - 51 ng/L           Radiologic Studies -      CT ABD PELV W CONT       Final Result     No acute findings with incidentals as above and no findings     otherwise to correlate with pain.            XR CHEST PA LAT       Final Result     No acute findings.                 CT Results  (Last 48 hours)                                       07/02/21 1424    CT ABD PELV W CONT  Final result            Impression:    No acute findings with incidentals as above and no findings      otherwise to correlate with pain.                       Narrative:    EXAM: CT ABD PELV W CONT             INDICATION: epigastric pain radiating to back, lower chest pain             COMPARISON: CT 09/12/2019.              CONTRAST: 100 mL of Isovue-370.             TECHNIQUE:  Following the uneventful intravenous administration of contrast, thin axial      images were obtained through the abdomen and pelvis. Coronal and sagittal      reconstructions were generated. Oral contrast was not administered. CT dose      reduction was achieved through use of a standardized protocol tailored for this      examination and automatic exposure control for dose modulation.             FINDINGS:       LOWER THORAX: The visualized lung bases are clear apart from partially calcified      granuloma in the right lower lobe. The heart is normal in size without      pericardial effusion. Coronary artery calcifications are noted.       LIVER: Hepatic cysts with no enhancing mass and normal enhancement of hepatic      vascular structures.      BILIARY TREE: Gallbladder is unremarkable. CBD is not dilated.      SPLEEN: Unremarkable.      PANCREAS: No mass or ductal dilatation.      ADRENALS: Unremarkable.      KIDNEYS: Bilateral renal cysts for which no follow-up is required with no      calculus, enhancing mass, or hydronephrosis.      STOMACH: Unremarkable.      SMALL BOWEL: No dilatation or wall thickening.      COLON: Noninflamed appearing left and sigmoid colon diverticula. No dilation or      wall thickening.      APPENDIX: Unremarkable.      PERITONEUM: No ascites or pneumoperitoneum.      RETROPERITONEUM: Atherosclerotic calcification without aneurysm or dissection.      No enlarged lymphadenopathy.      REPRODUCTIVE ORGANS: Uterus and ovaries are surgically absent.      URINARY BLADDER: No mass or calculus.      BONES: Degenerative spine change. No acute fracture or aggressive lesion.      ABDOMINAL WALL: No mass or hernia.      ADDITIONAL COMMENTS: N/A                                      CXR Results  (Last 48 hours)                                       07/02/21 1043    XR CHEST PA LAT  Final result            Impression:    No acute findings.                       Narrative:    EXAM: XR CHEST PA LAT             INDICATION: Chest pain and cough.             COMPARISON: Chest x-ray 02/05/2018.             TECHNIQUE: PA and lateral chest views             FINDINGS:  The cardiac size is within normal limits. The pulmonary vasculature      is within normal limits. Atherosclerotic calcifications affect the aortic arch      and the thoracic aorta  is tortuous.             The lungs and pleural spaces are clear. The visualized bones and upper abdomen      are age-appropriate.                                         Medical Decision Making     I am the first provider for this patient.      I reviewed the vital signs, available nursing notes, past  medical history, past surgical history, family history and social history.      Vital Signs-Reviewed the patient's vital signs.   Patient Vitals for the past 12 hrs:            Temp  Pulse  Resp  BP  SpO2            07/02/21 1003  99 ??F (37.2 ??C)  89  18  (!) 164/85  100 %        Records Reviewed: Nursing Notes and Old Medical Records      Provider Notes (Medical Decision Making):       87 year old female presents with 2 weeks of congestion followed by epigastric pain that has resolved.  Currently appears well no complaints other than cough productive of yellow sputum. Family does report double worsening. Vital signs are unremarkable.   Not hypoxic.      Will check basic labs and troponin.  Check EKG, very low suspicion for ACS given infectious symptoms.  Given age will check a CT of the abdomen pelvis to evaluate for intra-abdominal pathology given epigastric tenderness on exam.  Anticipate patient will  be discharged, would likely recommend antibiotics and expectorant.      ED Course:    Initial assessment performed. The patients presenting problems have been discussed, and they are in agreement with the care plan formulated and outlined with them.  I have encouraged them to ask questions as they arise throughout their visit.        ED Course as of 07/02/21 1602       Sun Jul 02, 2021        1401  Labs are unremarkable and her influenza is negative. [MB]     9563  CT is unremarkable of the abdomen pelvis. [MB]              ED Course User Index   [MB] Azalia Bilis, MD        Reassessed the patient, resting comfortably in bed.  Patient and caregiver informed regarding negative CT, chest x-ray and labs.  They are comfortable discharge.  Will discharge with azithromycin and guaifenesin.  Return precautions were discussed and  PCP follow-up provided.      Azalia Bilis, MD         Disposition:      Discharged      DISCHARGE PLAN:   1.      Current Discharge Medication List                 START taking these  medications          Details        azithromycin (Zithromax Z-Pak) 250 mg tablet  Use as directed, take 2 tabs on day 1 and 1 tab daily for 4 days   Qty: 6 Tablet, Refills: 0   Start date:  07/02/2021, End date: 07/07/2021               guaiFENesin (ORGANIDIN) 200 mg tablet  Take 1 Tablet by mouth every eight (8) hours as needed for Congestion.   Qty: 24 Tablet, Refills: 0   Start date: 07/02/2021                      2.      Follow-up Information                  Follow up With  Specialties  Details  Why  Contact Info              Enowtaku, Alphonzo Dublin, MD  Family Medicine  In 3 days    [address]   [place] [address]   [phone]                     3.  Return to ED if worse         Diagnosis        Clinical Impression:       1.  Acute bacterial bronchitis         2.  Acute chest pain            Attestations:      Azalia Bilis, MD              Please note that this dictation was completed with Dragon, the computer voice recognition software.  Quite often unanticipated grammatical, syntax, homophones, and other interpretive errors are inadvertently  transcribed by the computer software.  Please disregard these errors.  Please excuse any errors that have escaped final proofreading.  Thank you.

## 2021-07-07 NOTE — Progress Notes (Signed)
07/07/21   ACM attempted to follow up with patient for CCM assessment. Attempts to reach patient were unsuccessful. On final call a VM was left for patient with the following information: ACM contact information and reason for call. Will follow up again in 7 days.

## 2021-07-14 NOTE — Progress Notes (Signed)
Ambulatory Care Management Note    Date/Time:  07/14/2021 8:52 AM    This patient was received as a referral from Daily assignment.  Ambulatory Care Manager outreached to patient today to offer care management services.   Introduction to self and role of care manager provided.  Patient accepted care management services at this time.   Follow up call scheduled at this time.  Patient has Ambulatory Care Manager's contact number for any questions or concerns.

## 2021-07-21 NOTE — ACP (Advance Care Planning) (Signed)
Advance Care Planning:   Does patient have an Advance Directive:  currently not on file; education provided Patient says she wants to be full code, not interested in referral at this time.

## 2021-07-21 NOTE — Progress Notes (Signed)
Ambulatory Care Management Note    Date/Time:  07/21/2021 4:12 PM    This Ambulatory Care Manager (ACM) reviewed and updated the following screenings during this call; general assessment, disease specific assessment , self management assessment, SDOH assessments, ACP assessment and note, and medication reconciliation     Patient's challenges to self-management identified:   functional physical ability, level of motivation, medical condition, medication management, support system, and utilization of services      Medication Management:  good adherence and good understanding    Advance Care Planning:   Does patient have an Advance Directive:  currently not on file; education provided Patient says she wants to be full code, not interested in referral at this time.    [employer], Referrals, and Durable Medical Equipment:       Health Maintenance Due   Topic Date Due    Shingrix Vaccine Age 11> (1 of 2) Never done    Flu Vaccine (1) 03/06/2021    COVID-19 Vaccine (3 - Booster for Pfizer series) 04/19/2021    Medicare Yearly Exam  07/21/2021     Health Maintenance Reviewed: yes    Patient was asked to consider health care goals that they would like to focus on with this ACM.   ACM will follow up with patient to discuss goals and establish care plan in the next 7-14 days.       PCP/Specialist follow up:   Future Appointments   Date Time Provider Department Center   08/18/2021 11:20 AM Maurine Minister, Laurence Ferrari, MD [facility] BS AMB   08/29/2021 11:20 AM Virgel Manifold, DO [facility] BS AMB   11/21/2021 11:20 AM Shawna Orleans Nolon Bussing, MD [facility] BS AMB

## 2021-07-28 NOTE — Progress Notes (Signed)
Ambulatory Care Management Note      Date/Time:  07/29/2021 11:02 PM    Top Challenges reviewed with the provider   2 ED visits in one year  BP 164/85 on 07/02/21, Acute bacterial bronchitis   Patient says not taking medicine for BP  Overdue HM     Ambulatory Case Manager contacted patient for discussion and case management of self care.   Summary of patients top problems:   HTN- Patient denied taking BP medications. BP running 164/95. Compliant with low sodium diet  Potential for fall-Complain of general weakness. 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 Has nurse in home 5 days a week. A Niece then takes over the patient's care once the nurses leaves.   PCP/Specialist follow up:   Future Appointments   Date Time Provider [place]   08/18/2021 11:20 AM Doyce Para, Alphonzo Dublin, MD [facility] BS AMB   08/29/2021 11:20 AM Vernell Barrier, DO [facility] BS AMB   11/21/2021 11:20 AM Luellen Pucker, MD [facility] BS AMB           Goals        Attends follow up appointments on schedule      07/28/21  Outreach completed  Has followed up with appointment as scheduled  Reviewed all upcoming appointments  Patient verbalized knowledge and will attend  ACM will follow up again in12-14 days. pmk       Patient/Family verbalizes understanding of self-management of chronic disease' BP      07/28/21  Patient will participate in activities that reduce BP/cardiac workload.  Patient will maintain BP within individually acceptable range.  Will take all medications as ordered  Will remain compliant with low sodium diet  Patient will demonstrate stable cardiac rhythm and rate within patient's normal range.  Patient will participate in activities that will prevent stress (stress management, balanced activities and rest plan).  ACM will follow up again in12-14 days. pmk               Patient verbalized understanding of all information discussed. Patient has this Nurse Navigators contact  information for any further questions, concerns, or needs.

## 2021-08-10 NOTE — Telephone Encounter (Signed)
I received vm from unidentified person calling from [phone] stating that Pt has been waiting for 2 weeks for her Prednisone to be refilled. After looking in the chart, it appears as though the prednisone was discontinued. Karl Bales is the only person on Pt's Hippa, Please call Bonita Quin for further information.   [phone]

## 2021-08-10 NOTE — Telephone Encounter (Signed)
Left message on patients niece, [name] ( on phi), Identified VM to call office at [phone].

## 2021-08-14 ENCOUNTER — Emergency Department: Admit: 2021-08-14 | Payer: MEDICARE | Primary: Family Medicine

## 2021-08-14 ENCOUNTER — Encounter

## 2021-08-14 ENCOUNTER — Inpatient Hospital Stay
Admit: 2021-08-14 | Discharge: 2021-08-14 | Disposition: A | Discharge status: Home / Self Care | Payer: MEDICARE | Attending: Student in an Organized Health Care Education/Training Program

## 2021-08-14 DIAGNOSIS — M542 Cervicalgia: Secondary | ICD-10-CM

## 2021-08-14 DIAGNOSIS — W1830XA Fall on same level, unspecified, initial encounter: Secondary | ICD-10-CM

## 2021-08-14 MED ORDER — LIDOCAINE 5 % (700 MG/PATCH) ADHESIVE PATCH
5 % | MEDICATED_PATCH | CUTANEOUS | 3 refills | Status: AC
Start: 2021-08-14 — End: ?

## 2021-08-14 MED ORDER — EPHEDRINE SULFATE 50 MG/ML INTRAVENOUS SOLUTION
50 mg/mL | INTRAVENOUS | Status: AC
Start: 2021-08-14 — End: ?

## 2021-08-14 MED ORDER — PROPOFOL 10 MG/ML IV EMUL
10 mg/mL | INTRAVENOUS | Status: AC
Start: 2021-08-14 — End: ?

## 2021-08-14 MED ORDER — PREGABALIN 200 MG CAP
200 mg | ORAL_CAPSULE | Freq: Two times a day (BID) | ORAL | 2 refills | Status: AC
Start: 2021-08-14 — End: ?

## 2021-08-14 MED FILL — PROPOFOL 10 MG/ML IV EMUL: 10 mg/mL | INTRAVENOUS | Qty: 300

## 2021-08-14 MED FILL — EPHEDRINE SULFATE 50 MG/ML INTRAVENOUS SOLUTION: 50 mg/mL | INTRAVENOUS | Qty: 1

## 2021-08-14 NOTE — ED Provider Notes (Signed)
88 year old female with history of breast cancer, HTN, HLD presents to the ED with chief complaint of neck pain following ground-level fall.  Patient says she tripped, fell backwards, hit her head on the ground.  She is not sure why she tripped but denies any chest pain, difficulty breathing, lightheadedness, dizziness, abdominal pain, urinary symptoms, bowel symptoms.  She does report a mild posterior headache as well after the fall.  Did not take any medications prior to coming to the ED.  Is not on any blood thinners.  No numbness, weakness, wounds.    The history is provided by the patient.   Neck Pain   Associated symptoms include headaches. Pertinent negatives include no chest pain, no numbness and no weakness.      Past Medical History:   Diagnosis Date    Adverse effect of anesthesia     slow to wake    Anginal pain (HCC)     Arthritis     Breast cancer ([place]) 03/10/2018     left lumpectomy    Cancer ([place]) 01/2018    breast    Chronic pain     neuropathy    Constipation     GERD (gastroesophageal reflux disease)     Hearing loss     High cholesterol     Hypertension     Incontinence     Joint pain     Memory disorder     Muscle pain     Osteoporosis     Ringing in the ears     Snoring     Visual disturbance        Past Surgical History:   Procedure Laterality Date    HX BREAST LUMPECTOMY Left 03/10/2018    LEFT BREAST LUMPECTOMY WITH ULTRASOUND GUIDANCE performed by Luellen Pucker, MD at [facility]    HX HYSTERECTOMY      HX ORTHOPAEDIC      epidural pain mtg - pt unsure    HX OTHER SURGICAL Right 10/15/2016    ligament correction right foot    HX OTHER SURGICAL  11/26/2016    Pituitary tumor removal         Family History:   Problem Relation Age of Onset    Breast Cancer Mother 35    No Known Problems Father     Breast Cancer Sister 18    Breast Cancer Daughter 61    Cancer Daughter     Dementia Brother     Dementia Sister        Social History     Socioeconomic History    Marital status: WIDOWED      Spouse name: Not on file    Number of children: Not on file    Years of education: Not on file    Highest education level: Not on file   Occupational History    Not on file   Tobacco Use    Smoking status: Never    Smokeless tobacco: Never   Vaping Use    Vaping Use: Never used   Substance and Sexual Activity    Alcohol use: Not Currently     Comment: rarely    Drug use: No    Sexual activity: Never   Other Topics Concern    Not on file   Social History Narrative    Not on file     Social Determinants of Health     Financial Resource Strain: Low Risk     Difficulty  of Paying Living Expenses: Not very hard   Food Insecurity: No Food Insecurity    Worried About [place] in the Last Year: Never true    Ran Out of Food in the Last Year: Never true   Transportation Needs: No Transportation Needs    Lack of Transportation (Medical): No    Lack of Transportation (Non-Medical): No   Physical Activity: Inactive    Days of Exercise per Week: 0 days    Minutes of Exercise per Session: 0 min   Stress: No Stress Concern Present    Feeling of Stress : Not at all   Social Connections: Socially Isolated    Frequency of Communication with Friends and Family: Once a week    Frequency of Social Gatherings with Friends and Family: Once a week    Attends Religious Services: 1 to 4 times per year    Active Member of [employer] or Organizations: No    Attends Archivist Meetings: Never    Marital Status: Widowed   Human resources officer Violence: Not At Risk    Fear of Current or Ex-Partner: No    Emotionally Abused: No    Physically Abused: No    Sexually Abused: No   Housing Stability: Low Risk     Unable to Pay for Housing in the Last Year: No    Number of Places Lived in the Last Year: 1    Unstable Housing in the Last Year: No         ALLERGIES: Pcn [penicillins] and Sulfa (sulfonamide antibiotics)    Review of Systems   Constitutional:  Negative for chills and fever.   HENT:  Negative for congestion and rhinorrhea.     Respiratory:  Negative for cough and shortness of breath.    Cardiovascular:  Negative for chest pain and leg swelling.   Gastrointestinal:  Negative for abdominal pain, constipation, diarrhea, nausea and vomiting.   Genitourinary:  Negative for difficulty urinating, dysuria and hematuria.   Musculoskeletal:  Positive for neck pain. Negative for back pain.   Skin:  Negative for color change and rash.   Neurological:  Positive for headaches. Negative for dizziness, weakness, light-headedness and numbness.   Psychiatric/Behavioral:  Negative for agitation and confusion.      Vitals:    08/14/21 0020   BP: (!) 153/90   Pulse: 84   Resp: 17   Temp: 97.8 ??F (36.6 ??C)   SpO2: 98%   Weight: 61.7 kg (136 lb)   Height: 5' (1.524 m)            Physical Exam  Constitutional:       General: She is not in acute distress.     Appearance: She is well-developed.   HENT:      Head: Normocephalic and atraumatic.   Eyes:      General: No scleral icterus.     Pupils: Pupils are equal, round, and reactive to light.   Neck:      Trachea: No tracheal deviation.      Comments: Midline C-spine tenderness, no step-offs  Cardiovascular:      Rate and Rhythm: Normal rate and regular rhythm.      Heart sounds: No murmur heard.    No friction rub. No gallop.   Pulmonary:      Effort: Pulmonary effort is normal. No respiratory distress.      Breath sounds: Normal breath sounds. No wheezing or rales.   Abdominal:  General: Bowel sounds are normal. There is no distension.      Palpations: Abdomen is soft.      Tenderness: There is no abdominal tenderness.   Musculoskeletal:         General: No deformity.      Cervical back: Neck supple.   Skin:     General: Skin is warm and dry.   Neurological:      Mental Status: She is alert and oriented to person, place, and time.      Cranial Nerves: No cranial nerve deficit.      Sensory: No sensory deficit.      Motor: No weakness.   Psychiatric:         Behavior: Behavior normal.        Medical  Decision Making  88 year old female presenting to the ED following ground-level fall with neck pain, headache.  Differential include sprain, strain, fracture, intracranial hemorrhage, hematoma.  Will check CT of the head and neck.  Patient otherwise without any symptoms, fall overall seems mechanical in nature.  Offered Tylenol and patient declined.  Dispo pending CT results.    Amount and/or Complexity of Data Reviewed  Radiology: ordered and independent interpretation performed.     Details: no head bleed on CT           Procedures    DISCHARGE NOTE:  2:36 AM  The patient has been re-evaluated and feeling much better and are stable for discharge.  All available radiology and laboratory results have been reviewed with patient and/or available family.  Patient and/or family verbally conveyed their understanding and agreement of the patient's signs, symptoms, diagnosis, treatment and prognosis and additionally agree to follow-up as recommended in the discharge instructions or to return to the Emergency Department should their condition change or worsen prior to their follow-up appointment.  All questions have been answered and patient and/or available family express understanding.      LABORATORY RESULTS:  No results found for this or any previous visit (from the past 24 hour(s)).    IMAGING RESULTS:  CT HEAD WO CONT    Result Date: 08/14/2021  No acute intracranial abnormality. Left maxillary sinus disease.     CT SPINE CERV WO CONT    Addendum Date: 08/14/2021    Addendum: Previous radiograph from 2017 was reviewed. The patient had similar head positioning on that study. The findings of offset at the C1-2 articulation is likely chronic. Secondary to scoliosis    Result Date: 08/14/2021  1.  Offset of the left lateral mass of C2 in relation to the arch of C1 which may be related to the patient's head position. 2.  No evidence of acute fracture.     MEDICATIONS GIVEN:  Medications - No data to display    IMPRESSION:  1.  Fall from ground level    2. Neck pain        PLAN:  Follow-up Information       Follow up With Specialties Details Why Contact Info    E[place], Alphonzo Dublin, MD Family Medicine In 3 days  [name] [id number]  [phone]      [facility] [address] [facility]  As needed, If symptoms worsen [place]  [phone]          Current Discharge Medication List          Signed By: Deitra Mayo, MD     August 14, 2021

## 2021-08-14 NOTE — Telephone Encounter (Signed)
Spoke with patients niece, Kimberly [name], on [place]. Former patient of Dr Verdia Kuba. Scheduled to see you on 08/29/21. Dr Verdia Kuba would prescribe prednisone for a neuropathy flare up in her legs. States this did help. Requesting a refill.

## 2021-08-14 NOTE — ED Notes (Signed)
Pt reports a GLF approx an hour PTA.  Pt unsure what made her fall- denied cp or dizziness.  Pt states she fell straight hitting back of head.  Pt also c/o cervical neck pain.  Pt placed in Aspen collar in triage.  Pt denies taking anticoagulants And denies LOC. Pt was ambulatory into triage.

## 2021-08-14 NOTE — Telephone Encounter (Signed)
[name] returned [employer]. Please call again [phone]

## 2021-08-14 NOTE — Progress Notes (Signed)
Discussed with the patient's POA [facility], that I would not recommend using prednisone as a treatment for flareup of neuropathy.  I would recommend that we increase her Lyrica from 150mg  twice daily to 200 mg twice daily instead.  I also instructed her to use the lidocaine patches for additional aid in pain.   She understood and will pick up the new prescription.

## 2021-08-18 ENCOUNTER — Encounter: Attending: Neurology | Primary: Family Medicine

## 2021-08-18 ENCOUNTER — Ambulatory Visit: Admit: 2021-08-18 | Discharge: 2021-08-18 | Payer: MEDICARE | Attending: Family Medicine | Primary: Family Medicine

## 2021-08-18 ENCOUNTER — Encounter

## 2021-08-18 DIAGNOSIS — G629 Polyneuropathy, unspecified: Secondary | ICD-10-CM

## 2021-08-18 DIAGNOSIS — N952 Postmenopausal atrophic vaginitis: Secondary | ICD-10-CM

## 2021-08-18 DIAGNOSIS — Z0000 Encounter for general adult medical examination without abnormal findings: Secondary | ICD-10-CM

## 2021-08-18 MED ORDER — ESTRADIOL 10 MCG VAGINAL TAB
10 mcg | ORAL_TABLET | VAGINAL | 3 refills | Status: AC
Start: 2021-08-18 — End: ?

## 2021-08-18 MED ORDER — ACETAMINOPHEN-CODEINE 300 MG-30 MG TAB
300-30 mg | ORAL_TABLET | Freq: Four times a day (QID) | ORAL | 0 refills | Status: DC | PRN
Start: 2021-08-18 — End: 2021-09-19

## 2021-08-18 NOTE — Progress Notes (Signed)
 Identified pt with two pt identifiers(name and DOB). Reviewed record in preparation for visit and have obtained necessary documentation.  Chief Complaint   Patient presents with    Follow-up     3 month follow up blood pressure     Last OV 05/12/2021        Vitals:    08/18/21 1138 08/18/21 1208   BP: (!) 151/81 124/76   Pulse: 89    Resp: 17    Temp: 98.8 F (37.1 C)    TempSrc: Oral    SpO2: 95%    Weight: 143 lb (64.9 kg)    Height: 5' (1.524 m)        Health Maintenance Due   Topic    Shingles Vaccine (1 of 2)    Flu Vaccine (1)    COVID-19 Vaccine (3 - Booster for [employer] series)    Medicare Yearly Exam        Coordination of Care Questionnaire:  :   1) Have you been to an emergency room, urgent care, or hospitalized since your last visit?  If yes, where when, and reason for visit? no       2. Have seen or consulted any other health care provider since your last visit?   If yes, where when, and reason for visit?  NO      Patient is accompanied by nurse I have received verbal consent from Kimberly [name] to discuss any/all medical information while they are present in the room.

## 2021-08-18 NOTE — Progress Notes (Signed)
Goals Addressed                   This Visit's Progress     Attends follow up appointments on schedule   On track     07/28/21  Outreach completed  Has followed up with appointment as scheduled  Reviewed all upcoming appointments  Patient verbalized knowledge and will attend  ACM will follow up again in12-14 days. Pmk    08/18/21  Completed PCP follow up today  Has assistance of her private nurse  Reviewed all scheduled appointments  Educated on the Importance of Supportive resources in place to maintain patient in the community (ie. Home Health, DME equipment, refer to, medication assistant plan, [facility] etc.)  ACM contact information given. Will follow up in 10-14 days. PMK         Patient/Family verbalizes understanding of self-management of chronic disease' BP   On track     07/28/21  Patient will participate in activities that reduce BP/cardiac workload.  Patient will maintain BP within individually acceptable range.  Will take all medications as ordered  Will remain compliant with low sodium diet  Patient will demonstrate stable cardiac rhythm and rate within patient's normal range.  Patient will participate in activities that will prevent stress (stress management, balanced activities and rest plan).  ACM will follow up again in12-14 days. Pmk    08/18/21  Outreached to patient, congratulated her on BP being at [employer] she is doing good  Patient says she has a rollator and other assistive devices in the home  Using as needed to assist with walking  Reviewed some safety precautions  ACM will follow up again in12-14 days. Pmk

## 2021-08-18 NOTE — Progress Notes (Signed)
[facility]  [phone] [place] [address]  [phone]    C/C: ER follow up and medication management     HPI:    Kimberly [name] is a 88 y.o.  BLACK/AFR[place] AMERICAN  female who presents to clinic today for evaluation of the issues listed above.     Subjective;     Patient here with the nurse, Kirk (home nurse).    ER follow up:   pt seen in the ER about 4 days ago for GLF. Fell on her back. No head injury or LOC.  CT head and CT spine cerv were unremarkable.states that pain on her lower back have persisted.    Neuropathy:  On Lyrica 200mg  BID for neuropathy managed by neurology (Dr. Vernell Barrier). Caregiver requesting prednisone for flares.    Vaginal atrophy: pt requesting refill of vagifem which she was taking for vaginal atrophy. Has been on this for several years. Needs refills.    Pt denies any  fever, chill, chest pain, SOB, abdominal pain, n/v/d, HA or dizziness.     Other Health Habits and social history:  Her only daughter passed away.    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    Allergies- reviewed:   Allergies   Allergen Reactions    Pcn [Penicillins] Hives and Myalgia    Sulfa (Sulfonamide Antibiotics) Unknown (comments)       Past Medical History- reviewed:  Past Medical History:   Diagnosis Date    Adverse effect of anesthesia     slow to wake    Anginal pain ([place])     Arthritis     Breast cancer ([place]) 03/10/2018     left lumpectomy    Cancer ([place]) 01/2018    breast    Chronic pain     neuropathy    Constipation     GERD (gastroesophageal reflux disease)     Hearing loss     High cholesterol     Hypertension     Incontinence     Joint pain     Memory disorder     Muscle pain     Osteoporosis     Ringing in the ears     Snoring     Visual disturbance        Family History - reviewed:  Family History   Problem Relation Age of Onset     Breast Cancer Mother 62    No Known Problems Father     Breast Cancer Sister 4    Breast Cancer Daughter 93    Cancer Daughter     Dementia Brother     Dementia Sister        Social History - reviewed:  Social History     Socioeconomic History    Marital status: WIDOWED     Spouse name: Not on file    Number of children: Not on file    Years of education: Not on file    Highest education level: Not on file   Occupational History    Not on file   Tobacco Use    Smoking status: Never    Smokeless tobacco: Never   Vaping Use    Vaping Use: Never used   Substance and Sexual Activity    Alcohol use: Not Currently     Comment: rarely  Drug use: No    Sexual activity: Never   Other Topics Concern    Not on file   Social History Narrative    Not on file     Social Determinants of Health     Financial Resource Strain: Low Risk     Difficulty of Paying Living Expenses: Not very hard   Food Insecurity: No Food Insecurity    Worried About Running Out of Food in the Last Year: Never true    Ran Out of Food in the Last Year: Never true   Transportation Needs: No Transportation Needs    Lack of Transportation (Medical): No    Lack of Transportation (Non-Medical): No   Physical Activity: Inactive    Days of Exercise per Week: 0 days    Minutes of Exercise per Session: 0 min   Stress: No Stress Concern Present    Feeling of Stress : Not at all   Social Connections: Socially Isolated    Frequency of Communication with Friends and Family: Once a week    Frequency of Social Gatherings with Friends and Family: Once a week    Attends Religious Services: 1 to 4 times per year    Active Member of [employer] or Organizations: No    Attends Archivist Meetings: Never    Marital Status: Widowed   Human resources officer Violence: Not At Risk    Fear of Current or Ex-Partner: No    Emotionally Abused: No    Physically Abused: No    Sexually Abused: No   Housing Stability: Low Risk     Unable to Pay for Housing in the Last Year: No    Number  of Places Lived in the Last Year: 1    Unstable Housing in the Last Year: No       Depression screening:  3 most recent [facility] 08/18/2021   PHQ Not Done -   Little interest or pleasure in doing things Not at all   Feeling down, depressed, irritable, or hopeless Not at all   Total Score PHQ 2 0   Trouble falling or staying asleep, or sleeping too much -   Feeling tired or having little energy -   Poor appetite, weight loss, or overeating -   Feeling bad about yourself - or that you are a failure or have let yourself or your family down -   Trouble concentrating on things such as school, work, reading, or watching TV -   Moving or speaking so slowly that other people could have noticed; or the opposite being so fidgety that others notice -   Thoughts of being better off dead, or hurting yourself in some way -   PHQ 9 Score -   How difficult have these problems made it for you to do your work, take care of your home and get along with others -         Review of systems:     A comprehensive review of systems was negative except for that written in the History of Present Illness.       Visit Vitals  BP 124/76 (BP 1 Location: Right arm, BP Patient Position: Sitting, BP Cuff Size: Large adult)   Pulse 89   Temp 98.8 ??F (37.1 ??C) (Oral)   Resp 17   Ht 5' (1.524 m)   Wt 143 lb (64.9 kg)   LMP  (LMP Unknown)   SpO2 95%   BMI 27.93 kg/m??  General: Alert and oriented, in no acute distress. Well nourished.   EYE: PERRL. Sclera and conjuctival clear. Extraocular movements intact.  EARS: External normal, canals clear, tympanic membranes normal.   NOSE: Mucosa healthy without drainage or ulceration.  OROPHARYNX: No suspicious lesions, normal dentition, pharynx, tongue and tonsils normal.  NECK: Supple; no masses; thyroid normal.  LUNGS: Respirations unlabored; clear to auscultation bilaterally.  CARDIOVASCULAR: Regular, rate, and rhythm without murmurs, gallops or rubs.  ABDOMEN: Soft; nontender; nondistended; normoactive  bowel sounds; no masses or organomegaly.  MUSCULOSKELETAL: FROM in all extremities     EXT: No edema. Neurovascularlly intact. Walks with a cane  SKIN: No rash. No suspicious lesions or moles.  Neuro: Mental Status: Pt is alert and oriented to person, place, and time.      Assessment/Plan       ICD-10-CM ICD-9-CM    1. Polyneuropathy  G62.9 356.9       2. Radicular low back pain  M54.10 724.4 acetaminophen-codeine (TYLENOL #3) 300-30 mg per tablet      3. Vaginal atrophy  N95.2 627.3 estradioL (VAGIFEM) 10 mcg tab vaginal tablet      4. PVD (peripheral vascular disease) (HCC)  I73.9 443.9       5. Pituitary mass (HCC)  E23.6 253.8       6. Malignant neoplasm of upper-outer quadrant of left breast in female, estrogen receptor negative (HCC)  C50.412 174.4     Z17.1 V86.1       7. Stage 3a chronic kidney disease (HCC)  N18.31 585.3       8. Medicare annual wellness visit, subsequent  Z00.00 V70.0         1. Polyneuropathy  Recommend to keep taking Lyrica for neuropathy. Explained to caregiver that there is no need for prednisone at this time.  Consider lidocaine patch.    2. Radicular low back pain  - acetaminophen-codeine (TYLENOL #3) 300-30 mg per tablet; Take 1 Tablet by mouth every six (6) hours as needed for Pain for up to 7 days. Max Daily Amount: 4 Tablets.    3. Vaginal atrophy  - estradioL (VAGIFEM) 10 mcg tab vaginal tablet; INSERT 1 TABLET VAGINALLY TWICE PER WEEK     4. PVD (peripheral vascular disease) (HCC)  Stable. Doing well    5. Pituitary mass ([place])  Asymptomatic.  Had CT head (02/2021) which shows stable pituitary mass  Follows with Neuro    6. Malignant neoplasm of upper-outer quadrant of left breast in female, estrogen receptor negative ([place])  Diagnosed in 01/2018. Clinical stage 2A , Triple negative.   Follows with Dr. Silvana Newness.  Gets yearly mammogram.    7. Stage 3a chronic kidney disease ([place])  Continue avoiding nephrotoxic agents    8. Medicare annual wellness visit, subsequent  See separate  notes        Follow up: 3 months or as needed    I have discussed the diagnosis with the patient and the intended plan as seen in the above orders.  The patient has received an after-visit summary and questions were answered concerning future plans.  I have discussed medication side effects and warnings with the patient as well. Informed patient to return to the office if new symptoms arise.    Signed By: Clearence Ped, MD     August 18, 2021

## 2021-08-18 NOTE — Progress Notes (Signed)
[facility]  [phone] [place] [address]  [phone]    Date of visit: 08/19/2021    This is an Subsequent Medicare Annual Wellness Visit (AWV), (Performed more than 12 months after effective date of Medicare Part B enrollment and 12 months after last preventive visit, Once in a lifetime)    I have reviewed the patient's medical history in detail and updated the computerized patient record.     Kimberly [name] is a 88 y.o. female   History obtained from: the patient and caregiver    Concerns today   (Patient understands that medical problems addressed today may incur additional cost as this is a preventive visit)  -see separate notes    History     Patient Active Problem List   Diagnosis Code    SOB (shortness of breath) on exertion R06.02    Hypertension, essential, benign I10    High cholesterol E78.00    Chest pain with normal angiography--~ 15 yrs ago MCV R07.9    Primary insomnia F51.01    Memory loss R41.3    Acute bacterial conjunctivitis of right eye H10.31    Syncope and collapse R55    Headache disorder R51.9    Malignant neoplasm of upper-outer quadrant of left breast in female, estrogen receptor negative (HCC) C50.412, Z17.1    Headache R51.9    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    History of surgical removal of pituitary gland (HCC) Z98.890, E89.3    Bilateral carotid artery stenosis I65.23    Disturbance of memory R41.3    Acute alteration in mental status R41.82    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    PVD (peripheral vascular disease) (HCC) I73.9    Chronic renal disease, stage III N18.30     Past Medical History:   Diagnosis Date    Adverse effect of anesthesia     slow to wake    Anginal pain ([place])     Arthritis     Breast cancer ([place]) 03/10/2018     left lumpectomy    Cancer ([place]) 01/2018    breast    Chronic pain     neuropathy    Constipation     GERD (gastroesophageal  reflux disease)     Hearing loss     High cholesterol     Hypertension     Incontinence     Joint pain     Memory disorder     Muscle pain     Osteoporosis     Ringing in the ears     Snoring     Visual disturbance       Past Surgical History:   Procedure Laterality Date    HX BREAST LUMPECTOMY Left 03/10/2018    LEFT BREAST LUMPECTOMY WITH ULTRASOUND GUIDANCE performed by Luellen Pucker, MD at [facility]    HX HYSTERECTOMY      HX ORTHOPAEDIC      epidural pain mtg - pt unsure    HX OTHER SURGICAL Right 10/15/2016    ligament correction right foot    HX OTHER SURGICAL  11/26/2016    Pituitary tumor removal     Allergies   Allergen Reactions    Pcn [Penicillins] Hives and Myalgia    Sulfa (Sulfonamide Antibiotics) Unknown (comments)     Current Outpatient Medications   Medication Sig Dispense Refill  estradioL (VAGIFEM) 10 mcg tab vaginal tablet INSERT 1 TABLET VAGINALLY TWICE PER WEEK 10 Tablet 3    acetaminophen-codeine (TYLENOL #3) 300-30 mg per tablet Take 1 Tablet by mouth every six (6) hours as needed for Pain for up to 7 days. Max Daily Amount: 4 Tablets. 20 Tablet 0    lidocaine (LIDODERM) 5 % Apply patch to the affected area for 12 hours a day and remove for 12 hours a day. 90 Patch 3    guaiFENesin (ORGANIDIN) 200 mg tablet Take 1 Tablet by mouth every eight (8) hours as needed for Congestion. 24 Tablet 0    folic acid (FOLVITE) 1 mg tablet Take 1 Tab by mouth daily. 90 Tab 3    acetaminophen (TYLENOL) 500 mg tablet Take 1,000 mg by mouth every six (6) hours as needed for Pain.      polyethylene glycol (MIRALAX) 17 gram/dose powder TAKE 1 CAPFUL PO ONCE A DAY  11    aspirin 81 mg chewable tablet Take 81 mg by mouth daily.      pregabalin (LYRICA) 200 mg capsule Take 1 Capsule by mouth two (2) times a day. Max Daily Amount: 400 mg. 120 Capsule 2     Family History   Problem Relation Age of Onset    Breast Cancer Mother 61    No Known Problems Father     Breast Cancer Sister 27    Breast Cancer  Daughter 46    Cancer Daughter     Dementia Brother     Dementia Sister      Social History     Tobacco Use    Smoking status: Never    Smokeless tobacco: Never   Substance Use Topics    Alcohol use: Not Currently     Comment: rarely       Specialists/Care Team   Areyana L. Bolding has established care with the following healthcare providers:  Patient Care Team:  Ola Raap, Alphonzo Dublin, MD as PCP - General (Family Medicine)  Doyce Para, Alphonzo Dublin, MD as PCP - [facility] Empaneled Provider  MacDougall, Christian Mate, MD (General Surgery)  Trisha Mangle, MD (Surgery General)  Judithann Sheen, RN as Ambulatory Care Manager (Family Medicine)    Depression risk factor screening        3 most recent [facility] Screens 08/18/2021   PHQ Not Done -   Little interest or pleasure in doing things Not at all   Feeling down, depressed, irritable, or hopeless Not at all   Total Score PHQ 2 0   Trouble falling or staying asleep, or sleeping too much -   Feeling tired or having little energy -   Poor appetite, weight loss, or overeating -   Feeling bad about yourself - or that you are a failure or have let yourself or your family down -   Trouble concentrating on things such as school, work, reading, or watching TV -   Moving or speaking so slowly that other people could have noticed; or the opposite being so fidgety that others notice -   Thoughts of being better off dead, or hurting yourself in some way -   PHQ 9 Score -   How difficult have these problems made it for you to do your work, take care of your home and get along with others -         Alcohol Risk Screen    Do you average more than 1 drink per night or more than 7  drinks a week:  No    On any one occasion in the past three months have you have had more than 3 drinks containing alcohol:  No        Functional Ability and Level of Safety:    Hearing: Hearing is good.      Activities of Daily Living:  The home contains: handrails and grab bars  Patient needs help with:   transportation, shopping, preparing meals, housework, managing medications, and bathroom needs      Ambulation: with difficulty, uses a cane       Fall Risk:  Fall Risk Assessment, last 12 mths 08/19/2021   Able to walk? Yes   Fall in past 12 months? 1   Do you feel unsteady? 0   Are you worried about falling 1   Is TUG test greater than 12 seconds? 0   Is the gait abnormal? 1   Number of falls in past 12 months 2   Fall with injury? 0      Abuse Screen:  Patient is not abused       Cognitive Screening    Has your family/caregiver stated any concerns about your memory: no     Normal  AAOx4    Health Maintenance Due     Health Maintenance Due   Topic Date Due    Shingles Vaccine (1 of 2) Never done    Flu Vaccine (1) 03/06/2021    COVID-19 Vaccine (3 - Booster for Pfizer series) 04/19/2021       Review of Systems (if indicated for problems addressed today)   See separate notes    Physical Examination     Vitals:    08/18/21 1138 08/18/21 1208   BP: (!) 151/81 124/76   Pulse: 89    Resp: 17    Temp: 98.8 ??F (37.1 ??C)    TempSrc: Oral    SpO2: 95%    Weight: 143 lb (64.9 kg)    Height: 5' (1.524 m)      Body mass index is 27.93 kg/m??.   No results found.    Assessment/Plan   Education and counseling provided:  Are appropriate based on today's review and evaluation. See orders above      ICD-10-CM ICD-9-CM    1. Medicare annual wellness visit, subsequent  Z00.00 V70.0       2. Polyneuropathy  G62.9 356.9       3. Radicular low back pain  M54.10 724.4 acetaminophen-codeine (TYLENOL #3) 300-30 mg per tablet      4. Vaginal atrophy  N95.2 627.3 estradioL (VAGIFEM) 10 mcg tab vaginal tablet      5. PVD (peripheral vascular disease) (HCC)  I73.9 443.9       6. Pituitary mass (HCC)  E23.6 253.8       7. Malignant neoplasm of upper-outer quadrant of left breast in female, estrogen receptor negative (HCC)  C50.412 174.4     Z17.1 V86.1       8. Stage 3a chronic kidney disease (HCC)  N18.31 585.3           Medicare Wellness,  Subsequent:  I have discussed with pt the following topics:   - Discussed with patient the cancer risk factors and recommendations  - Reviewed diet, exercise and weight control  -Immunizations appropriate for age were discussed with patient and updated   - Recommended sodium restriction   - Reviewed medications and side effects in detail    See separate notes  for acute/chronic problems addressed at his visit.    Follow up: Yearly Medicare Wellness. RTC to clinic sooner as indicated for chronic/acute care.    We discussed the expected course, resolution and complications of the diagnosis(es) in detail.  Medication risks, benefits, costs, interactions, and alternatives were discussed as indicated.  I advised to contact the office if his condition worsens, changes or fails to improve as anticipated. Pt expressed understanding with the diagnosis(es) and plan. Patient understands that this encounter was a temporary measure, and the importance of further follow up and examination was emphasized.  Patient verbalized understanding.      Signed By: Clearence Ped, MD     August 19, 2021

## 2021-08-29 ENCOUNTER — Ambulatory Visit: Admit: 2021-08-29 | Payer: MEDICARE | Attending: Internal Medicine | Primary: Family Medicine

## 2021-08-29 ENCOUNTER — Encounter

## 2021-08-29 DIAGNOSIS — G629 Polyneuropathy, unspecified: Secondary | ICD-10-CM

## 2021-08-29 NOTE — Progress Notes (Signed)
Neurology Note    Patient ID:  Kimberly [name]  [id number]  88 y.o.  [birth year]      Date of Consultation:  August 29, 2021      Assessment and Plan:  88 year old female presenting with neuropathy on Lyrica who continues to have some tingling and burning symmetrically in the feet primarily at bedtime.  Discussed conservative treatment such as Epsom salt baths, lidocaine patches, aerobic exercise if able.  She is on Lyrica 150 mg twice daily and this is the maximum dose unfortunately.  She is also wearing compression stockings and getting acupuncture which she feels both of these things are not helpful.    Recommendations:  - Continue acupuncture if this is helpful  - Continue Lyrica 150 mg twice daily  - Continue lidocaine patches as needed  - Recommend Epsom salt baths  - Continue compression stockings  - Continue aerobic exercise.      Problem was discussed at length with the patient.  We reviewed the results of the study in detail.  We also discussed prognosis and treatment options.  The patient had opportunity today to ask all questions, expressed understanding of the instructions provided, and agreed with the plan of treatment.      Follow up in 6-8 months.     I spent 50 minutes providing care to this patient with >50% of the time spent counseling.         History of Present Illness:   Kimberly [name] is a 88 y.o. female with history of breast cancer, left lumpectomy, chronic pain with neuropathic pain, arthritis, hyperlipidemia, hypertension who presents as a follow-up.  The patient was a former patient of Dr. Verdia Kuba.     The patient has been taking Lyrica for neuropathy.  She still continues to have some burning pain as well as tingling primarily at bedtime when she lies asleep at night.  She denies any significant falls or weakness.  She lives on her own but does have a nurse who checks on her for about 6 hours every day.  She is also in the room and helps to provide the history.  The patient states that she  has been using compression stockings as well as using lidocaine patches and this has been helpful for her neuropathy.  She also gets acupuncture and feels that this is helping significantly.  She has not tried Epson salt baths.    She has had extensive laboratory work-up for neuropathy which has been largely unremarkable.    Past Medical History:   Diagnosis Date    Adverse effect of anesthesia     slow to wake    Anginal pain (HCC)     Arthritis     Breast cancer ([place]) 03/10/2018     left lumpectomy    Cancer ([place]) 01/2018    breast    Chronic pain     neuropathy    Constipation     GERD (gastroesophageal reflux disease)     Hearing loss     High cholesterol     Hypertension     Incontinence     Joint pain     Memory disorder     Muscle pain     Osteoporosis     Ringing in the ears     Snoring     Visual disturbance         Past Surgical History:   Procedure Laterality Date    HX BREAST LUMPECTOMY Left 03/10/2018  LEFT BREAST LUMPECTOMY WITH ULTRASOUND GUIDANCE performed by Luellen Pucker, MD at [facility]    HX HYSTERECTOMY      HX ORTHOPAEDIC      epidural pain mtg - pt unsure    HX OTHER SURGICAL Right 10/15/2016    ligament correction right foot    HX OTHER SURGICAL  11/26/2016    Pituitary tumor removal        Family History   Problem Relation Age of Onset    Breast Cancer Mother 72    No Known Problems Father     Breast Cancer Sister 51    Breast Cancer Daughter 38    Cancer Daughter     Dementia Brother     Dementia Sister         Social History     Tobacco Use    Smoking status: Never    Smokeless tobacco: Never   Substance Use Topics    Alcohol use: Not Currently     Comment: rarely        Allergies   Allergen Reactions    Pcn [Penicillins] Hives and Myalgia    Sulfa (Sulfonamide Antibiotics) Unknown (comments)        Prior to Admission medications    Medication Sig Start Date End Date Taking? Authorizing Provider   estradioL (VAGIFEM) 10 mcg tab vaginal tablet INSERT 1 TABLET VAGINALLY TWICE  PER WEEK 08/18/21  Yes Enowtaku, Alphonzo Dublin, MD   pregabalin (LYRICA) 200 mg capsule Take 1 Capsule by mouth two (2) times a day. Max Daily Amount: 400 mg. 08/14/21  Yes Haylee Mcanany, DO   lidocaine (LIDODERM) 5 % Apply patch to the affected area for 12 hours a day and remove for 12 hours a day. 08/14/21  Yes Durrel Mcnee, DO   guaiFENesin (ORGANIDIN) 200 mg tablet Take 1 Tablet by mouth every eight (8) hours as needed for Congestion. 07/02/21  Yes Azalia Bilis, MD   folic acid (FOLVITE) 1 mg tablet Take 1 Tab by mouth daily. 02/05/19  Yes Aralu, Cletus C, MD   acetaminophen (TYLENOL) 500 mg tablet Take 1,000 mg by mouth every six (6) hours as needed for Pain.   Yes Provider, Historical   polyethylene glycol (MIRALAX) 17 gram/dose powder TAKE 1 CAPFUL PO ONCE A DAY 09/15/15  Yes Provider, Historical   aspirin 81 mg chewable tablet Take 81 mg by mouth daily.   Yes Provider, Historical       Review of Systems:    General, constitutional: negative  Eyes, vision: negative  Ears, nose, throat: negative  Cardiovascular, heart: negative  Respiratory: negative  Gastrointestinal: negative  Genitourinary: negative  Musculoskeletal: negative  Skin and integumentary: negative  Psychiatric: negative  Endocrine: negative  Neurological: negative, except for HPI  Hematologic/lymphatic: negative  Allergy/immunology: negative    [] Unable to obtain  ROS due to  [] mental status change  [] sedated   [] intubated    Objective:     Visit Vitals  BP (!) 152/90 (BP 1 Location: Left upper arm, BP Patient Position: Sitting, BP Cuff Size: Small adult)   Pulse 89   Temp 97.5 ??F (36.4 ??C) (Temporal)   Resp 18   Ht 5' (1.524 m)   Wt 137 lb (62.1 kg)   LMP  (LMP Unknown)   SpO2 96%   BMI 26.76 kg/m??       Physical Exam:  General:  appears well nourished in no acute distress  Neck: no obvious deformity or masses  Lungs: comfortable on room air  Heart:  well-perfused   Lower extremity: no edema  Skin: intact    Neurological exam:  Awake, alert,  oriented to person, place and time  Recent and remote memory were normal  Attention and concentration were intact  Language was intact.  There was no aphasia  Speech: no dysarthria  Fund of knowledge was preserved    Cranial nerves:   II-XII were tested    PERRRLA  Visual fields were full to finger counting   EOMI, no evidence of nystagmus  Facial sensation:  normal and symmetric  Facial motor: normal and symmetric  Hearing intact  SCM strength intact  Tongue: midline without fasciculations    Motor:   Tone normal in upper and lower extremities     Strength testing:   deltoid triceps biceps Wrist ext. Wrist flex. intrinsics Hip flex. Hip ext. Knee ext.  Knee flex Dorsi flex Plantar flex   Right 5 5 5 5 5 5 5  NT 5 5 5 5    Left 5 5 5 5 5 5 5  NT 5 5 5 5      Sensory:  Upper extremity: intact to pp, light touch, and vibration > 10 seconds, and proprioception  Lower extremity: Diminished to pinprick distal to the knees bilaterally, intact to light touch, diminished to vibration bilaterally at the ankles intact at the knees.    Reflexes:    Right Left  Biceps  2 2  Triceps  2 2  Brachiorad. 2 2  Patella  1 1  Achilles absent bilaterally     Cerebellar testing:  no tremor apparent, finger/nose and rapid alternating movements were intact    Gait: unsteady, uses a cane. Slow cautious.     Labs:     Lab Results   Component Value Date/Time    Sodium 140 07/02/2021 11:39 AM    Potassium 4.5 07/02/2021 11:39 AM    Chloride 108 07/02/2021 11:39 AM    Glucose 98 07/02/2021 11:39 AM    BUN 12 07/02/2021 11:39 AM    Creatinine 0.80 07/02/2021 11:39 AM    Calcium 9.6 07/02/2021 11:39 AM    WBC 6.2 07/02/2021 10:19 AM    HCT 36.6 07/02/2021 10:19 AM    HGB 11.6 07/02/2021 10:19 AM    PLATELET 356 07/02/2021 10:19 AM       Imaging:        Results from Hospital Encounter encounter on 08/14/21    CT SPINE CERV WO CONT    Addendum 08/14/2021  2:31 AM  Addendum: Previous radiograph from 2017 was reviewed. The patient had similar  head  positioning on that study. The findings of offset at the C1-2 articulation  is likely chronic. Secondary to scoliosis    Narrative  EXAM:  CT CERVICAL SPINE WITHOUT CONTRAST    INDICATION: fall.    COMPARISON: None.    CONTRAST:  None.    TECHNIQUE: Multislice helical CT of the cervical spine was performed without  intravenous contrast administration.  Sagittal and coronal reformats were  generated.  CT dose reduction was achieved through use of a standardized  protocol tailored for this examination and automatic exposure control for dose  modulation.    FINDINGS:    There is no evidence of acute fracture. There is posterior subluxation of the  left arch of C1 in relation to C2 which may be related to patient head  positioning. Multilevel degenerative changes are noted in the cervical spine.  Biapical airspace  opacities present in the lung apices.    Impression  1.  Offset of the left lateral mass of C2 in relation to the arch of C1 which  may be related to the patient's head position.    2.  No evidence of acute fracture.             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

## 2021-08-29 NOTE — Progress Notes (Signed)
Chief Complaint   Patient presents with    Neuropathy     Follow up- former patient of Dr Verdia Kuba-     1. Have you been to the ER, urgent care clinic since your last visit?  Hospitalized since your last visit? Yes [facility] after a fall - 3 weeks ago     2. Have you seen or consulted any other health care providers outside of the [facility] since your last visit?  Include any pap smears or colon screening.  No

## 2021-09-05 NOTE — Progress Notes (Signed)
Goals Addressed                   This Visit's Progress     Attends follow up appointments on schedule   On track     07/28/21  Outreach completed  Has followed up with appointment as scheduled  Reviewed all upcoming appointments  Patient verbalized knowledge and will attend  ACM will follow up again in12-14 days. Pmk    08/18/21  Completed PCP follow up today  Has assistance of her private nurse  Reviewed all scheduled appointments  Educated on the Importance of Supportive resources in place to maintain patient in the community (ie. Home Health, DME equipment, refer to, medication assistant plan, [facility] etc.)  ACM contact information given. Will follow up in 10-14 days. [place]    09/05/21    OUtreach completed  Completed neurological follow up on 08/29/21  PCP follow up completed on 08/18/21  Reviewed all scheduled appointments  Has contact for [facility] contact information given. Will follow up in 10-14 days. PMK         Patient/Family verbalizes understanding of self-management of chronic disease' BP   On track     07/28/21  Patient will participate in activities that reduce BP/cardiac workload.  Patient will maintain BP within individually acceptable range.  Will take all medications as ordered  Will remain compliant with low sodium diet  Patient will demonstrate stable cardiac rhythm and rate within patient's normal range.  Patient will participate in activities that will prevent stress (stress management, balanced activities and rest plan).  ACM will follow up again in12-14 days. Pmk    08/18/21  Outreached to patient, congratulated her on BP being at [employer] she is doing good  Patient says she has a rollator and other assistive devices in the home  Using as needed to assist with walking  Reviewed some safety precautions  ACM will follow up again in12-14 days. Pmk    09/05/21    Outreach completed  Admits to feeling about the same.  Taking Lyrica and continues to have some tingling  and burning symmetrically in the feet primarily at bedtime  Wearing compression stockings and getting acupuncture which she feels both of these things are not helpful  Had ED visit on 08/14/21 for fall and neck pain  Continue to use Rolator  ACM will follow up again in12-14 days. Pmk

## 2021-09-18 ENCOUNTER — Encounter

## 2021-09-19 MED ORDER — ACETAMINOPHEN-CODEINE 300 MG-30 MG TAB
300-30 mg | ORAL_TABLET | Freq: Three times a day (TID) | ORAL | 0 refills | Status: DC | PRN
Start: 2021-09-19 — End: 2021-10-26

## 2021-09-21 NOTE — Progress Notes (Signed)
 Goals Addressed                   This Visit's Progress     Attends follow up appointments on schedule   On track     07/28/21  Outreach completed  Has followed up with appointment as scheduled  Reviewed all upcoming appointments  Patient verbalized knowledge and will attend  ACM will follow up again in12-14 days. Pmk    08/18/21  Completed PCP follow up today  Has assistance of her private nurse  Reviewed all scheduled appointments  Educated on the Importance of Supportive resources in place to maintain patient in the community (ie. Home Health, DME equipment, refer to, medication assistant plan, [facility] etc.)  ACM contact information given. Will follow up in 10-14 days. PMK    09/05/21    OUtreach completed  Completed neurological follow up on 08/29/21  PCP follow up completed on 08/18/21  Reviewed all scheduled appointments  Has contact for [facility]  ACM contact information given. Will follow up in 10-14 days. PMK    09/21/21  Outreach completed  Has followed up with appointment as scheduled  Reviewed all upcoming appointments  Patient verbalized knowledge and will attend  ACM will follow up again in  12-14 days.pmk         Patient/Family verbalizes understanding of self-management of chronic disease' BP   On track     07/28/21  Patient will participate in activities that reduce BP/cardiac workload.  Patient will maintain BP within individually acceptable range.  Will take all medications as ordered  Will remain compliant with low sodium diet  Patient will demonstrate stable cardiac rhythm and rate within patient's normal range.  Patient will participate in activities that will prevent stress (stress management, balanced activities and rest plan).  ACM will follow up again in12-14 days. Pmk    08/18/21  Outreached to patient, congratulated her on BP being at [employer] she is doing good  Patient says she has a rollator and other assistive devices in the home  Using as needed to assist with  walking  Reviewed some safety precautions  ACM will follow up again in12-14 days. Pmk    09/05/21    Outreach completed  Admits to feeling about the same.  Taking Lyrica  and continues to have some tingling and burning symmetrically in the feet primarily at bedtime  Wearing compression stockings and getting acupuncture which she feels both of these things are not helpful  Had ED visit on 08/14/21 for fall and neck pain  Continue to use Rolator  ACM will follow up again in12-14 days. Pmk    09/21/21  Admits to feeling better  Back pain has been better  Taking Tylenot #3 and Lyrica   Taking all other medications as ordered  Using rollator as assistance device  ACM will follow up again in12-14 days. Pmk

## 2021-09-25 ENCOUNTER — Encounter

## 2021-09-25 NOTE — Telephone Encounter (Signed)
 This dose was discontinued refused the request to [facility] on [address]

## 2021-09-25 NOTE — Telephone Encounter (Signed)
 Linda called for refill of:    Requested Prescriptions     Pending Prescriptions Disp Refills    pregabalin  (LYRICA ) 200 mg capsule 120 Capsule 2     Sig: Take 1 Capsule by mouth two (2) times a day. Max Daily Amount: 400 mg.     [facility] told her that they contacted us  and that we would not refill script without visit. Pt was just seen on Jan. Pt is out of medication.     Please send to [facility] at [address] and [address] [phone]

## 2021-09-28 NOTE — Telephone Encounter (Signed)
 Spoke with pharmacist at [facility]. Patient picked up the Lyrica  on 08/25/21 and still has refills left.

## 2021-10-05 NOTE — Progress Notes (Signed)
Ambulatory Care Management Note    Date/Time:  10/05/2021 11:52 AM    Patient Current Location: [place]    Patient has graduated from the Complex Case Management  program on 10/05/21  .  Patient/family has the ability to self-manage at this time.  Care management goals have been completed. No further Ambulatory Care Manager follow up scheduled.     Goals Addressed                   This Visit's Progress     COMPLETED: Attends follow up appointments on schedule   On track     07/28/21  Outreach completed  Has followed up with appointment as scheduled  Reviewed all upcoming appointments  Patient verbalized knowledge and will attend  ACM will follow up again in12-14 days. Pmk    08/18/21  Completed PCP follow up today  Has assistance of her private nurse  Reviewed all scheduled appointments  Educated on the Importance of Supportive resources in place to maintain patient in the community (ie. Home Health, DME equipment, refer to, medication assistant plan, [facility] etc.)  ACM contact information given. Will follow up in 10-14 days. PMK    09/05/21    OUtreach completed  Completed neurological follow up on 08/29/21  PCP follow up completed on 08/18/21  Reviewed all scheduled appointments  Has contact for [facility]  ACM contact information given. Will follow up in 10-14 days. PMK    09/21/21  Outreach completed  Has followed up with appointment as scheduled  Reviewed all upcoming appointments  Patient verbalized knowledge and will attend  ACM will follow up again in  12-14 days.pmk    10/05/21  Outreach completed  Admits to feeling well  Goals met  Episode resolved; contact given. pmk       COMPLETED: Patient/Family verbalizes understanding of self-management of chronic disease' BP   On track     07/28/21  Patient will participate in activities that reduce BP/cardiac workload.  Patient will maintain BP within individually acceptable range.  Will take all medications as ordered  Will remain compliant with low  sodium diet  Patient will demonstrate stable cardiac rhythm and rate within patient's normal range.  Patient will participate in activities that will prevent stress (stress management, balanced activities and rest plan).  ACM will follow up again in12-14 days. Pmk    08/18/21  Outreached to patient, congratulated her on BP being at [employer] she is doing good  Patient says she has a rollator and other assistive devices in the home  Using as needed to assist with walking  Reviewed some safety precautions  ACM will follow up again in12-14 days. Pmk    09/05/21    Outreach completed  Admits to feeling about the same.  Taking Lyrica and continues to have some tingling and burning symmetrically in the feet primarily at bedtime  Wearing compression stockings and getting acupuncture which she feels both of these things are not helpful  Had ED visit on 08/14/21 for fall and neck pain  Continue to use Rolator  ACM will follow up again in12-14 days. Pmk    09/21/21  Admits to feeling better  Back pain has been better  Taking Tylenot #3 and Lyrica  Taking all other medications as ordered  Using rollator as assistance device  ACM will follow up again in12-14 days. Pmk    10/05/21  Outreach completed  Admits to feeling well  Goals met  Episode resolved;  contact given. pmk              Patient has Ambulatory Care Manager's contact information for any further questions, concerns, or needs.  Patients upcoming visits:    Future Appointments   Date Time Provider Department Center   11/16/2021 11:20 AM Maurine Minister, Laurence Ferrari, MD [facility] BS AMB   11/21/2021 11:20 AM Sabino Snipes, MD [facility] BS AMB   05/01/2022 11:00 AM Ghaffari, Leila, DO [facility] BS AMB

## 2021-10-26 ENCOUNTER — Encounter

## 2021-10-27 MED ORDER — ACETAMINOPHEN-CODEINE 300 MG-30 MG TAB
300-30 mg | ORAL_TABLET | Freq: Three times a day (TID) | ORAL | 0 refills | Status: AC | PRN
Start: 2021-10-27 — End: 2021-11-25

## 2021-11-16 ENCOUNTER — Ambulatory Visit: Admit: 2021-11-16 | Discharge: 2021-11-16 | Payer: MEDICARE | Attending: Family Medicine | Primary: Family Medicine

## 2021-11-16 ENCOUNTER — Ambulatory Visit: Attending: Family Medicine | Primary: Family Medicine

## 2021-11-16 DIAGNOSIS — M5410 Radiculopathy, site unspecified: Secondary | ICD-10-CM

## 2021-11-16 MED ORDER — ACETAMINOPHEN-CODEINE 300 MG-30 MG TAB
300-30 mg | ORAL_TABLET | Freq: Three times a day (TID) | ORAL | 0 refills | Status: AC | PRN
Start: 2021-11-16 — End: 2021-12-16

## 2021-11-16 NOTE — Progress Notes (Signed)
Name and DOB Verified.     Pharmacy verified     Accompanied By: Baldomero Lamy    Chief Complaint   Patient presents with    Follow-up     3 Months 08/18/2021 Polyneuropathy       1. Have you been to the ER, urgent care clinic since your last visit?  Hospitalized since your last visit?No    2. Have you seen or consulted any other health care providers outside of the [facility] since your last visit?  Include any pap smears or colon screening. No      Health Maintenance Due   Topic Date Due    Shingles Vaccine (1 of 2) Never done    COVID-19 Vaccine (3 - Booster for Pfizer series) 04/19/2021

## 2021-11-16 NOTE — Progress Notes (Signed)
[facility]  [phone] [place] [address]  [phone]    C/C: Back pain    HPI:    Kimberly [name] is a 88 y.o.  BLACK/AFRICAN AMERICAN  female who presents to clinic today for evaluation of the issues listed above.     Subjective;     Patient here with the nurse, [place] (home nurse).    Back pain, chronic:  On Tylenol #3.  Stable symptoms.    Neuropathy:  On Lyrica '200mg'$  BID for neuropathy managed by neurology (Dr. Vernell Barrier).       Pt denies any  fever, chill, chest pain, SOB, abdominal pain, n/v/d, HA or dizziness.     Other Health Habits and social history:  Her only daughter passed away.    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    Allergies- reviewed:   Allergies   Allergen Reactions    Pcn [Penicillins] Hives and Myalgia    Sulfa (Sulfonamide Antibiotics) Unknown (comments)       Past Medical History- reviewed:  Past Medical History:   Diagnosis Date    Adverse effect of anesthesia     slow to wake    Anginal pain ([place])     Arthritis     Breast cancer ([place]) 03/10/2018     left lumpectomy    Cancer ([place]) 01/2018    breast    Chronic pain     neuropathy    Constipation     GERD (gastroesophageal reflux disease)     Hearing loss     High cholesterol     Hypertension     Incontinence     Joint pain     Memory disorder     Muscle pain     Osteoporosis     Ringing in the ears     Snoring     Visual disturbance        Family History - reviewed:  Family History   Problem Relation Age of Onset    Breast Cancer Mother 35    No Known Problems Father     Breast Cancer Sister 43    Breast Cancer Daughter 34    Cancer Daughter     Dementia Brother     Dementia Sister        Social History - reviewed:  Social History     Socioeconomic History    Marital status: WIDOWED     Spouse name: Not on file    Number of children: Not on file    Years of education:  Not on file    Highest education level: Not on file   Occupational History    Not on file   Tobacco Use    Smoking status: Never    Smokeless tobacco: Never   Vaping Use    Vaping Use: Never used   Substance and Sexual Activity    Alcohol use: Not Currently     Comment: rarely    Drug use: No    Sexual activity: Never   Other Topics Concern    Not on file   Social History Narrative    Not on file     Social Determinants of Health     Financial Resource Strain: Low Risk     Difficulty of Paying Living Expenses: Not very hard   Food  Insecurity: No Food Insecurity    Worried About Charity fundraiser in the Last Year: Never true    Ran Out of Food in the Last Year: Never true   Transportation Needs: No Transportation Needs    Lack of Transportation (Medical): No    Lack of Transportation (Non-Medical): No   Physical Activity: Inactive    Days of Exercise per Week: 0 days    Minutes of Exercise per Session: 0 min   Stress: No Stress Concern Present    Feeling of Stress : Not at all   Social Connections: Socially Isolated    Frequency of Communication with Friends and Family: Once a week    Frequency of Social Gatherings with Friends and Family: Once a week    Attends Religious Services: 1 to 4 times per year    Active Member of [employer] or Organizations: No    Attends Archivist Meetings: Never    Marital Status: Widowed   Human resources officer Violence: Not At Risk    Fear of Current or Ex-Partner: No    Emotionally Abused: No    Physically Abused: No    Sexually Abused: No   Housing Stability: Low Risk     Unable to Pay for Housing in the Last Year: No    Number of Places Lived in the Last Year: 1    Unstable Housing in the Last Year: No       Depression screening:  3 most recent [place] 08/29/2021   PHQ Not Done -   Little interest or pleasure in doing things Not at all   Feeling down, depressed, irritable, or hopeless Not at all   Total Score PHQ 2 0   Trouble falling or staying asleep, or sleeping too much -    Feeling tired or having little energy -   Poor appetite, weight loss, or overeating -   Feeling bad about yourself - or that you are a failure or have let yourself or your family down -   Trouble concentrating on things such as school, work, reading, or watching TV -   Moving or speaking so slowly that other people could have noticed; or the opposite being so fidgety that others notice -   Thoughts of being better off dead, or hurting yourself in some way -   PHQ 9 Score -   How difficult have these problems made it for you to do your work, take care of your home and get along with others -         Review of systems:     A comprehensive review of systems was negative except for that written in the History of Present Illness.       Visit Vitals  BP 116/73 (BP 1 Location: Right arm, BP Patient Position: Sitting, BP Cuff Size: Adult)   Pulse 88   Temp 97.9 F (36.6 C) (Oral)   Resp 16   Ht 5' (1.524 m)   Wt 135 lb (61.2 kg)   LMP  (LMP Unknown)   SpO2 94%   BMI 26.37 kg/m       General: Alert and oriented, in no acute distress. Well nourished.   LUNGS: Respirations unlabored; clear to auscultation bilaterally.  CARDIOVASCULAR: Regular, rate, and rhythm without murmurs, gallops or rubs.  MSK: ROM limited by back pain      Assessment/Plan       ICD-10-CM ICD-9-CM    1. Radicular low back pain  M54.10  724.4 acetaminophen-codeine (TYLENOL #3) 300-30 mg per tablet          1. Radicular low back pain:  Will continue on Tylenol #3.  - acetaminophen-codeine (TYLENOL #3) 300-30 mg per tablet; Take 1 Tablet by mouth every six (6) hours as needed for Pain for up to 7 days. Max Daily Amount: 4 Tablets.      Follow up: 6 months or as needed    I have discussed the diagnosis with the patient and the intended plan as seen in the above orders.  The patient has received an after-visit summary and questions were answered concerning future plans.  I have discussed medication side effects and warnings with the patient as well.  Informed patient to return to the office if new symptoms arise.    Signed By: Clearence Ped, MD     November 16, 2021

## 2021-11-21 ENCOUNTER — Ambulatory Visit: Admit: 2021-11-21 | Discharge: 2021-11-21 | Payer: MEDICARE | Attending: Surgery | Primary: Family Medicine

## 2021-11-21 ENCOUNTER — Ambulatory Visit: Attending: Surgery | Primary: Family Medicine

## 2021-11-21 DIAGNOSIS — C50412 Malignant neoplasm of upper-outer quadrant of left female breast: Secondary | ICD-10-CM

## 2021-11-21 NOTE — Progress Notes (Signed)
Progress Notes by Luellen Pucker, MD at 11/21/21 1120                Author: Luellen Pucker, MD  Service: --  Author Type: Physician       Filed: 11/21/21 1135  Encounter Date: 11/21/2021  Status: Signed          Editor: Luellen Pucker, MD (Physician)               HISTORY OF PRESENT ILLNESS   Kimberly [name] is a 88 y.o.  female who comes in for follow up by Clearence Ped, MD for  breast cancer    Follow-up   Associated symptoms include headaches. Pertinent negatives include no chest pain,  no abdominal pain and no shortness of breath.    Breast Problem   Associated  symptoms include headaches. Pertinent negatives include no chest pain, no abdominal pain and no shortness of breath.    Breast Cancer   Associated symptoms include headaches. Pertinent negatives include no chest pain,  no abdominal pain and no shortness of breath.       She went for screening mammogram and was noted to have a density in the UOQ of the left breast.  Subsequently she had an [place] confirming a 2.0 x 1.8 x 1.6 cm in the 0100 position 6 cm from nipple.   Core biopsy 01/21/2018 demonstrated a poorly differentiated  carcinoma compatible with IDC G3 triple negative.  She had not felt a lump, skin changes, nipple changes or drainage, unexplained weight loss or bone pain.  She had menarche at 50, only pregnancy at 21, menopause at 44 and did not take HRT.  Her MGM,  M, D, and sister all died from metastatic breast cancer.   She underwent a left lumpectomy for a T2NxMx IDC 03/10/2018.  She received adjuvant WBRT Briscoe Burns).  She had a bilateral 3D dx mammogram 03/23/2021 was BIRADS 2.        Past Medical History:        Diagnosis  Date         ?  Adverse effect of anesthesia            slow to wake         ?  Anginal pain ([place])       ?  Arthritis       ?  Breast cancer ([place])  03/10/2018           left lumpectomy         ?  Cancer ([place])  01/2018          breast         ?  Chronic pain            neuropathy         ?   Constipation       ?  GERD (gastroesophageal reflux disease)       ?  Headache       ?  Hearing loss       ?  High cholesterol       ?  Hypertension       ?  Incontinence       ?  Joint pain       ?  Memory disorder       ?  Muscle pain       ?  Osteoporosis       ?  Ringing in the ears       ?  Snoring           ?  Visual disturbance            Past Surgical History:         Procedure  Laterality  Date          ?  HX BREAST LUMPECTOMY  Left  03/10/2018          LEFT BREAST LUMPECTOMY WITH ULTRASOUND GUIDANCE performed by Luellen Pucker, MD at [facility] OR          ?  HX HYSTERECTOMY         ?  HX ORTHOPAEDIC              epidural pain mtg - pt unsure          ?  HX OTHER SURGICAL  Right  10/15/2016          ligament correction right foot          ?  HX OTHER SURGICAL    11/26/2016          Pituitary tumor removal          Family History         Problem  Relation  Age of Onset          ?  Breast Cancer  Mother  24     ?  No Known Problems  Father       ?  Breast Cancer  Sister  56     ?  Breast Cancer  Daughter  50     ?  Cancer  Daughter       ?  Dementia  Brother            ?  Dementia  Sister            Social History          Tobacco Use         ?  Smoking status:  Never     ?  Smokeless tobacco:  Never       Vaping Use         ?  Vaping Use:  Never used       Substance Use Topics         ?  Alcohol use:  Not Currently             Comment: rarely         ?  Drug use:  No          Current Outpatient Medications        Medication  Sig         ?  acetaminophen-codeine (TYLENOL #3) 300-30 mg per tablet  Take 1 Tablet by mouth every eight (8) hours as needed for Pain for up to 30 days. Max Daily Amount: 3 Tablets.     ?  estradioL (VAGIFEM) 10 mcg tab vaginal tablet  INSERT 1 TABLET VAGINALLY TWICE PER WEEK     ?  pregabalin (LYRICA) 200 mg capsule  Take 1 Capsule by mouth two (2) times a day. Max Daily Amount: 400 mg.     ?  lidocaine (LIDODERM) 5 %  Apply patch to the affected area for 12 hours a day and  remove for 12 hours a day.     ?  folic acid (FOLVITE) 1 mg tablet  Take 1 Tab by mouth daily.     ?  polyethylene glycol (MIRALAX) 17 gram/dose  powder  TAKE 1 CAPFUL PO ONCE A DAY     ?  aspirin 81 mg chewable tablet  Take 1 Tablet by mouth daily.         ?  guaiFENesin (ORGANIDIN) 200 mg tablet  Take 1 Tablet by mouth every eight (8) hours as needed for Congestion. (Patient not taking: Reported on 11/21/2021)          No current facility-administered medications for this visit.          Allergies        Allergen  Reactions         ?  Pcn [Penicillins]  Hives and Myalgia     ?  Sulfa (Sulfonamide Antibiotics)  Unknown (comments)             Other reaction(s): Unknown (comments)              Review of Systems    Constitutional:  Positive for malaise/fatigue. Negative for chills, diaphoresis, fever and weight loss.    HENT:  Positive for hearing loss. Negative for sore throat.     Eyes:  Negative for blurred vision and discharge.         Vision loss    Respiratory:  Negative for cough, shortness of breath and wheezing.     Cardiovascular:  Positive for leg swelling. Negative for chest pain, palpitations, orthopnea and claudication.    Gastrointestinal:  Positive for constipation. Negative for abdominal pain, diarrhea, heartburn, melena, nausea and vomiting.    Genitourinary:  Positive for dysuria. Negative for flank pain, frequency and hematuria.    Musculoskeletal:  Positive for joint pain and myalgias . Negative for back pain and neck pain.    Skin:  Negative for rash.    Neurological:  Positive for headaches. Negative for dizziness, speech change, focal weakness, seizures, loss of consciousness and weakness.    Endo/Heme/Allergies:  Does not bruise/bleed easily.    Psychiatric/Behavioral:  Negative for depression and memory loss.        Visit Vitals      BP  134/70 (BP 1 Location: Right upper arm)     Pulse  84     Temp  97.6 F (36.4 C) (Temporal)     Resp  16     Ht  5' (1.524 m)     Wt  63.6 kg (140 lb 3.2 oz)      LMP   (LMP Unknown)     SpO2  96%        BMI  27.38 kg/m           Physical Exam   Exam conducted with a chaperone present.    Constitutional:        General: She is not in acute distress.      Appearance: She is well-developed. She is not diaphoretic.    HENT:       Head: Normocephalic and atraumatic.       Mouth/Throat:       Pharynx: No oropharyngeal exudate.    Eyes:       General: No scleral icterus.      Conjunctiva/sclera: Conjunctivae normal.       Pupils: Pupils are equal, round, and reactive to light.    Neck:       Thyroid: No thyromegaly.       Vascular: No JVD.       Trachea: No tracheal deviation.    Cardiovascular:       Rate  and Rhythm: Normal rate and regular rhythm.       Heart sounds: No murmur heard.     No friction rub. No gallop.    Pulmonary:       Effort: Pulmonary effort is normal. No respiratory distress.       Breath sounds: Normal breath sounds. No wheezing or rales.    Chest:    Breasts:      Breasts are symmetrical.       Right: Normal. No inverted nipple, mass, nipple discharge, skin change or tenderness.       Left: Mass (induration at lumpectomy site) and  skin change (radiation changes ) present. No inverted nipple, nipple discharge or tenderness.            Abdominal:       General: Bowel sounds are normal. There is no distension.       Palpations: Abdomen is soft. There is no mass.       Tenderness: There is no abdominal tenderness. There is no guarding or rebound.     Musculoskeletal:          General: Normal range of motion.       Cervical back: Normal range of motion and neck supple.     Lymphadenopathy:       Cervical: No cervical adenopathy.       Upper Body:       Right upper body: No supraclavicular or axillary adenopathy.       Left upper body: No supraclavicular or axillary adenopathy.    Skin:      General: Skin is warm and dry.       Coloration: Skin is not pale.       Findings: No erythema or rash.    Neurological:       Mental Status: She is alert and oriented to  person, place, and time.       Cranial Nerves: No cranial nerve deficit.       Gait: Gait abnormal (uses cane and has leg braces for balance).       Comments: Balance issues    Psychiatric:          Behavior: Behavior normal.          Thought Content: Thought content normal.          Judgment: Judgment normal.             ASSESSMENT and PLAN   1.  Left breast cancer early stage, Clinically stage 2A (T2N0M0). Triple negative Dx 01/2018.   NED at 5 years and 10 months   2.  Strong family hx breast cancer      3.  Allergy to PCN with hives and upset stomach.  Ok for Ancef   4.  Essential hypertension.  On rx   5.  COVID-19.  Got first dose of vaccine   6.  Social.   Here with with aide today   Lives with niece and has an aide that comes to the house      We discussed stopping mammograms but she wishes to continue with them      Bilateral 3D dx mammogram 03/2022 if health is still good and she desires      RTC 6 months         Luellen Pucker, MD FACS

## 2021-11-21 NOTE — Progress Notes (Signed)
Identified pt with two pt identifiers(name and DOB). Reviewed record in preparation for visit and have obtained necessary documentation. All patient medications has been reviewed.    Chief Complaint   Patient presents with    Follow-up     Follow up for breast cancer.        Health Maintenance Due   Topic    Shingles Vaccine (1 of 2)    COVID-19 Vaccine (3 - Booster for Pfizer series)       Vitals:    11/21/21 1119 11/21/21 1123   BP: (!) 146/74 134/70   Pulse: 84    Resp: 16    Temp: 97.6 F (36.4 C)    TempSrc: Temporal    SpO2: 96%    Weight: 140 lb 3.2 oz (63.6 kg)    Height: 5' (1.524 m)    PainSc:   0 - No pain        4.Have you been to the ER, urgent care clinic since your last visit?  Hospitalized since your last visit?No    5. Have you seen or consulted any other health care providers outside of the [facility] since your last visit?  Include any pap smears or colon screening. No      Patient is accompanied by nursing attendant I have received verbal consent from [name] to discuss any/all medical information while they are present in the room.

## 2021-11-21 NOTE — Progress Notes (Signed)
Formatting of this note is different from the original.  Identified pt with two pt identifiers(name and DOB). Reviewed record in preparation for visit and have obtained necessary documentation. All patient medications has been reviewed.    Chief Complaint   Patient presents with    Follow-up     Follow up for breast cancer.      Health Maintenance Due   Topic    Shingles Vaccine (1 of 2)    COVID-19 Vaccine (3 - Booster for Pfizer series)     Vitals:    11/21/21 1119 11/21/21 1123   BP: (!) 146/74 134/70   Pulse: 84    Resp: 16    Temp: 97.6 F (36.4 C)    TempSrc: Temporal    SpO2: 96%    Weight: 140 lb 3.2 oz (63.6 kg)    Height: 5' (1.524 m)    PainSc:   0 - No pain      4.Have you been to the ER, urgent care clinic since your last visit?  Hospitalized since your last visit?No    5. Have you seen or consulted any other health care providers outside of the [facility] since your last visit?  Include any pap smears or colon screening. No    Patient is accompanied by nursing attendant I have received verbal consent from [name] to discuss any/all medical information while they are present in the room.    Electronically signed by Bethanne Ginger at 11/21/2021 11:35 AM EDT

## 2021-11-21 NOTE — Progress Notes (Signed)
Formatting of this note is different from the original.  Images from the original note were not included.  HISTORY OF PRESENT ILLNESS  Kimberly [name] is a 88 y.o. female who comes in for follow up by Clearence Ped, MD for  breast cancer  Follow-up  Associated symptoms include headaches. Pertinent negatives include no chest pain, no abdominal pain and no shortness of breath.   Breast Problem  Associated symptoms include headaches. Pertinent negatives include no chest pain, no abdominal pain and no shortness of breath.   Breast Cancer  Associated symptoms include headaches. Pertinent negatives include no chest pain, no abdominal pain and no shortness of breath.     She went for screening mammogram and was noted to have a density in the UOQ of the left breast.  Subsequently she had an [place] confirming a 2.0 x 1.8 x 1.6 cm in the 0100 position 6 cm from nipple.   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  She had not felt a lump, skin changes, nipple changes or drainage, unexplained weight loss or bone pain.  She had menarche at 13, only pregnancy at 17, menopause at 52 and did not take HRT.  Her MGM, M, D, and sister all died from metastatic breast cancer.   She underwent a left lumpectomy for a T2NxMx IDC 03/10/2018.  She received adjuvant WBRT Briscoe Burns).  She had a bilateral 3D dx mammogram 03/23/2021 was BIRADS 2.    Past Medical History:   Diagnosis Date    Adverse effect of anesthesia     slow to wake    Anginal pain (HCC)     Arthritis     Breast cancer ([facility]) 03/10/2018     left lumpectomy    Cancer ([facility]) 01/2018    breast    Chronic pain     neuropathy    Constipation     GERD (gastroesophageal reflux disease)     Headache     Hearing loss     High cholesterol     Hypertension     Incontinence     Joint pain     Memory disorder     Muscle pain     Osteoporosis     Ringing in the ears     Snoring     Visual disturbance      Past Surgical  History:   Procedure Laterality Date    HX BREAST LUMPECTOMY Left 03/10/2018    LEFT BREAST LUMPECTOMY WITH ULTRASOUND GUIDANCE performed by Luellen Pucker, MD at [facility]    HX HYSTERECTOMY      HX ORTHOPAEDIC      epidural pain mtg - pt unsure    HX OTHER SURGICAL Right 10/15/2016    ligament correction right foot    HX OTHER SURGICAL  11/26/2016    Pituitary tumor removal     Family History   Problem Relation Age of Onset    Breast Cancer Mother 72    No Known Problems Father     Breast Cancer Sister 91    Breast Cancer Daughter 28    Cancer Daughter     Dementia Brother     Dementia Sister      Social History     Tobacco Use    Smoking status: Never    Smokeless tobacco: Never   Vaping Use    Vaping Use: Never used   Substance Use Topics  Alcohol use: Not Currently     Comment: rarely    Drug use: No     Current Outpatient Medications   Medication Sig    acetaminophen-codeine (TYLENOL #3) 300-30 mg per tablet Take 1 Tablet by mouth every eight (8) hours as needed for Pain for up to 30 days. Max Daily Amount: 3 Tablets.    estradioL (VAGIFEM) 10 mcg tab vaginal tablet INSERT 1 TABLET VAGINALLY TWICE PER WEEK    pregabalin (LYRICA) 200 mg capsule Take 1 Capsule by mouth two (2) times a day. Max Daily Amount: 400 mg.    lidocaine (LIDODERM) 5 % Apply patch to the affected area for 12 hours a day and remove for 12 hours a day.    folic acid (FOLVITE) 1 mg tablet Take 1 Tab by mouth daily.    polyethylene glycol (MIRALAX) 17 gram/dose powder TAKE 1 CAPFUL PO ONCE A DAY    aspirin 81 mg chewable tablet Take 1 Tablet by mouth daily.    guaiFENesin (ORGANIDIN) 200 mg tablet Take 1 Tablet by mouth every eight (8) hours as needed for Congestion. (Patient not taking: Reported on 11/21/2021)     No current facility-administered medications for this visit.     Allergies   Allergen Reactions    Pcn [Penicillins] Hives and Myalgia    Sulfa (Sulfonamide Antibiotics) Unknown (comments)     Other reaction(s):  Unknown (comments)     Review of Systems   Constitutional:  Positive for malaise/fatigue. Negative for chills, diaphoresis, fever and weight loss.   HENT:  Positive for hearing loss. Negative for sore throat.    Eyes:  Negative for blurred vision and discharge.        Vision loss   Respiratory:  Negative for cough, shortness of breath and wheezing.    Cardiovascular:  Positive for leg swelling. Negative for chest pain, palpitations, orthopnea and claudication.   Gastrointestinal:  Positive for constipation. Negative for abdominal pain, diarrhea, heartburn, melena, nausea and vomiting.   Genitourinary:  Positive for dysuria. Negative for flank pain, frequency and hematuria.   Musculoskeletal:  Positive for joint pain and myalgias. Negative for back pain and neck pain.   Skin:  Negative for rash.   Neurological:  Positive for headaches. Negative for dizziness, speech change, focal weakness, seizures, loss of consciousness and weakness.   Endo/Heme/Allergies:  Does not bruise/bleed easily.   Psychiatric/Behavioral:  Negative for depression and memory loss.      Visit Vitals  BP 134/70 (BP 1 Location: Right upper arm)   Pulse 84   Temp 97.6 F (36.4 C) (Temporal)   Resp 16   Ht 5' (1.524 m)   Wt 63.6 kg (140 lb 3.2 oz)   LMP  (LMP Unknown)   SpO2 96%   BMI 27.38 kg/m     Physical Exam  Exam conducted with a chaperone present.   Constitutional:       General: She is not in acute distress.     Appearance: She is well-developed. She is not diaphoretic.   HENT:      Head: Normocephalic and atraumatic.      Mouth/Throat:      Pharynx: No oropharyngeal exudate.   Eyes:      General: No scleral icterus.     Conjunctiva/sclera: Conjunctivae normal.      Pupils: Pupils are equal, round, and reactive to light.   Neck:      Thyroid: No thyromegaly.      Vascular: No JVD.  Trachea: No tracheal deviation.   Cardiovascular:      Rate and Rhythm: Normal rate and regular rhythm.      Heart sounds: No murmur heard.    No friction  rub. No gallop.   Pulmonary:      Effort: Pulmonary effort is normal. No respiratory distress.      Breath sounds: Normal breath sounds. No wheezing or rales.   Chest:   Breasts:     Breasts are symmetrical.      Right: Normal. No inverted nipple, mass, nipple discharge, skin change or tenderness.      Left: Mass (induration at lumpectomy site) and skin change (radiation changes ) present. No inverted nipple, nipple discharge or tenderness.     Abdominal:      General: Bowel sounds are normal. There is no distension.      Palpations: Abdomen is soft. There is no mass.      Tenderness: There is no abdominal tenderness. There is no guarding or rebound.   Musculoskeletal:         General: Normal range of motion.      Cervical back: Normal range of motion and neck supple.   Lymphadenopathy:      Cervical: No cervical adenopathy.      Upper Body:      Right upper body: No supraclavicular or axillary adenopathy.      Left upper body: No supraclavicular or axillary adenopathy.   Skin:     General: Skin is warm and dry.      Coloration: Skin is not pale.      Findings: No erythema or rash.   Neurological:      Mental Status: She is alert and oriented to person, place, and time.      Cranial Nerves: No cranial nerve deficit.      Gait: Gait abnormal (uses cane and has leg braces for balance).      Comments: Balance issues   Psychiatric:         Behavior: Behavior normal.         Thought Content: Thought content normal.         Judgment: Judgment normal.     ASSESSMENT and PLAN  1.  Left breast cancer early stage, Clinically stage 2A (T2N0M0). Triple negative Dx 01/2018.   NED at 5 years and 10 months  2.  Strong family hx breast cancer    3.  Allergy to PCN with hives and upset stomach.  Ok for Ancef  4.  Essential hypertension.  On rx  5.  COVID-19.  Got first dose of vaccine  6.  Social.  Here with with aide today  Lives with niece and has an aide that comes to the house    We discussed stopping mammograms but she wishes to  continue with them    Bilateral 3D dx mammogram 03/2022 if health is still good and she desires    RTC 6 months    Luellen Pucker, MD FACS      Electronically signed by Luellen Pucker, MD at 11/21/2021 11:35 AM EDT

## 2021-11-26 ENCOUNTER — Encounter

## 2021-12-07 MED ORDER — ACETAMINOPHEN-CODEINE 300 MG-30 MG TAB
300-30 mg | ORAL_TABLET | ORAL | 0 refills | Status: AC
Start: 2021-12-07 — End: 2022-01-06

## 2022-01-26 ENCOUNTER — Encounter

## 2022-01-29 MED ORDER — ACETAMINOPHEN-CODEINE 300-30 MG PO TABS
300-30 MG | ORAL_TABLET | Freq: Three times a day (TID) | ORAL | 0 refills | Status: AC | PRN
Start: 2022-01-29 — End: 2022-02-28

## 2022-01-29 NOTE — Telephone Encounter (Signed)
Last appointment: 11/16/21  Next appointment: 05/18/22  Previous refill encounter(s): 12/07/21 #30    Requested Prescriptions     Pending Prescriptions Disp Refills    acetaminophen-codeine (TYLENOL #3) 300-30 MG per tablet [Pharmacy Med Name: ACETAMINOPHEN/COD #3 (300/'30MG'$ ) TAB] 30 tablet 0     Sig: Take 1 tablet by mouth every 8 hours as needed for Pain for up to 30 days. Max Daily Amount: 3 tablets         For Pharmacy Admin Tracking Only    Program: Medication Refill  CPA in place:    Recommendation Provided To:   Intervention Detail: New Rx: 1, reason: Patient Preference  Intervention Accepted By:   Jamse Arn Closed?:    Time Spent (min): 5

## 2022-03-05 ENCOUNTER — Encounter

## 2022-03-05 MED ORDER — ACETAMINOPHEN-CODEINE 300-30 MG PO TABS
300-30 MG | ORAL_TABLET | Freq: Two times a day (BID) | ORAL | 0 refills | Status: AC | PRN
Start: 2022-03-05 — End: 2022-04-04

## 2022-03-05 NOTE — Telephone Encounter (Signed)
Last Seen: 11/16/2021    Next Appt: 05/18/2022

## 2022-03-07 ENCOUNTER — Encounter

## 2022-03-07 NOTE — Telephone Encounter (Signed)
Last OV was 08/29/21   Last filled on 08/14/21 #120 with 2 refills

## 2022-03-07 NOTE — Telephone Encounter (Signed)
Calling to request refill fo pregabalin (LYRICA) 200 MG capsule      To sent to [facility] [place], [place] [address] - [address] [phone] Wanda Plump [phone]      Can also call [phone] for niece, Rollene Fare if needed.

## 2022-03-08 MED ORDER — PREGABALIN 200 MG PO CAPS
200 MG | ORAL_CAPSULE | Freq: Two times a day (BID) | ORAL | 5 refills | Status: AC
Start: 2022-03-08 — End: 2022-09-04

## 2022-03-08 NOTE — Telephone Encounter (Signed)
Kimberly [name] called again to see when we will send in the pregabalin. Advised we are just waiting for approval from Dr Meriam Sprague.

## 2022-03-13 NOTE — Telephone Encounter (Signed)
Returned call to scheduling. Advised that she still needs dx mammo, was diagnosed with breast cancer 01/2018. Patient has not had Korea scheduled with these, not ordered by Dr. Silvana Newness, ok to proceed without.   Scheduling will contact the patient.

## 2022-03-13 NOTE — Telephone Encounter (Signed)
Radiology scheduling, angela, called in regards to receiving order for diagnostic mammogram but no order for Korea please advise    [phone]

## 2022-05-01 ENCOUNTER — Encounter: Attending: Internal Medicine | Primary: Family Medicine

## 2022-05-14 ENCOUNTER — Ambulatory Visit: Admit: 2022-05-14 | Discharge: 2022-05-14 | Payer: MEDICARE | Attending: Family | Primary: Family Medicine

## 2022-05-14 DIAGNOSIS — R030 Elevated blood-pressure reading, without diagnosis of hypertension: Secondary | ICD-10-CM

## 2022-05-14 NOTE — Telephone Encounter (Signed)
Care giver will take patient to: [address], [place] [address] [name] today.     Will keep appt. Offered by Dr. Ronnald Ramp for Friday  05/18/2022 @  12 pm

## 2022-05-14 NOTE — Telephone Encounter (Signed)
Location [place]: [place]    Received call from [facility] at [facility] with Red Flag Complaint.    Subjective: Caller states "High Blood pressure (190/98), this morning- 162/80 and 165/92"     Current Symptoms: Bought new BP cuff as a comparison to old. Readings are similar.     Using left upper arm automatic cuff.     Taking medication as rx.     Mild dizziness with walking at times suspected, pt does not say dizziness.     Onset: This past weekend, had not been taking BP previously.    Associated Symptoms:  ADLs normal, eating and drinking normal    Pain Severity: 0/10    Temperature: Denies, 97.1    What has been tried: Increased hydration    Took pregabalin out of sequence on Saturday morning for leg pain from neuropathy    Recommended disposition: See in Office Today    Care advice provided, patient verbalizes understanding; denies any other questions or concerns; instructed to call back for any new or worsening symptoms.    Patient/Caller agrees with recommended disposition; Probation officer provided warm transfer to [employer] at [employer] for appointment scheduling    Attention Provider:  Thank you for allowing me to participate in the care of your patient.  The patient was connected to triage in response to information provided to the ECC/PSC.  Please do not respond through this encounter as the response is not directed to a shared pool.  Reason for Disposition   Patient wants to be seen    Protocols used: Blood Pressure - High-ADULT-OH

## 2022-05-14 NOTE — Telephone Encounter (Signed)
Patients care giver Rollene Fare  would like a return call regarding her BP, she said it has been runny high all weekend.  It has been 165/92 this morning and yesterday it was 190/98.  Please give Rollene Fare a call @ [phone]

## 2022-05-14 NOTE — Progress Notes (Signed)
Subjective: (As above and below)     The patient/guardian gave verbal consent to treat.        Chief Complaint   Patient presents with    Hypertension     Pt. States her blood pressure has been rising since Thursday        Kimberly [name] is a 88 y.o. female who presents for evaluation of :   Elevated blood pressure  No hx of Hypertension  Her niece decided to randomly check her BP and noted reading of around 150/80s  Has since checked multiple times per day most readings in this range, several readings in 140s and at least one reading with diastolic reading reading in 70s  Denies any chest pain, SOB, dizziness and feels her usual baseline.    Review of Systems    Review of Systems - negative except as listed above    Reviewed PmHx, RxHx, FmHx, SocHx, AllgHx and updated in chart.  Family History   Problem Relation Age of Onset    Dementia Sister     Dementia Brother     Cancer Daughter     Breast Cancer Daughter 73    Breast Cancer Sister 47    Breast Cancer Mother 63    No Known Problems Father        Past Medical History:   Diagnosis Date    Adverse effect of anesthesia     slow to wake    Anginal pain ([facility])     Arthritis     Breast cancer ([facility]) 03/10/2018     left lumpectomy    Cancer ([facility]) 01/2018    breast    Chronic pain     neuropathy    Constipation     GERD (gastroesophageal reflux disease)     Headache     Hearing loss     High cholesterol     Hypertension     Incontinence     Joint pain     Memory disorder     Muscle pain     Osteoporosis     Ringing in the ears     Snoring     Visual disturbance       Social History     Socioeconomic History    Marital status: Widowed     Spouse name: None    Number of children: None    Years of education: None    Highest education level: None   Tobacco Use    Smoking status: Never    Smokeless tobacco: Never   Substance and Sexual Activity    Alcohol use: Not Currently    Drug use: No     Social Determinants of Health     Financial Resource Strain: Low Risk   (07/14/2021)    Overall Financial Resource Strain (CARDIA)     Difficulty of Paying Living Expenses: Not very hard   Food Insecurity: No Food Insecurity (07/14/2021)    Hunger Vital Sign     Worried About Running Out of Food in the Last Year: Never true     Ran Out of Food in the Last Year: Never true   Transportation Needs: No Transportation Needs (07/14/2021)    PRAPARE - Armed forces logistics/support/administrative officer (Medical): No     Lack of Transportation (Non-Medical): No   Physical Activity: Inactive (07/14/2021)    Exercise Vital Sign     Days of Exercise per Week: 0 days  Minutes of Exercise per Session: 0 min   Stress: No Stress Concern Present (07/14/2021)    [place]     Feeling of Stress : Not at all   Social Connections: Socially Isolated (07/14/2021)    Social Connection and Isolation Panel [NHANES]     Frequency of Communication with Friends and Family: Once a week     Frequency of Social Gatherings with Friends and Family: Once a week     Attends Religious Services: 1 to 4 times per year     Active Member of [employer] or Organizations: No     Attends Archivist Meetings: Never     Marital Status: Widowed   Intimate Partner Violence: Not At Risk (07/14/2021)    Humiliation, Afraid, Rape, and Kick questionnaire     Fear of Current or Ex-Partner: No     Emotionally Abused: No     Physically Abused: No     Sexually Abused: No   Housing Stability: Low Risk  (07/14/2021)    Housing Stability Vital Sign     Unable to Pay for Housing in the Last Year: No     Number of [place] in the Last Year: 1     Unstable Housing in the Last Year: No          Current Outpatient Medications   Medication Sig    pregabalin (LYRICA) 200 MG capsule Take 1 capsule by mouth 2 times daily for 180 days. Max Daily Amount: 400 mg    acetaminophen (TYLENOL) 500 MG tablet Take by mouth every 6 hours as needed    aspirin 81 MG chewable tablet Take by mouth daily     Estradiol (VAGIFEM) 10 MCG TABS vaginal tablet INSERT 1 TABLET VAGINALLY TWICE PER WEEK    folic acid (FOLVITE) 1 MG tablet Take by mouth daily    guaiFENesin 200 MG tablet Take by mouth every 8 hours as needed    lidocaine (LIDODERM) 5 % Apply patch to the affected area for 12 hours a day and remove for 12 hours a day.    polyethylene glycol (GLYCOLAX) 17 GM/SCOOP powder TAKE 1 CAPFUL PO ONCE A DAY     No current facility-administered medications for this visit.       Objective:     Vitals:    05/14/22 1509 05/14/22 1616   BP: (!) 186/90 (!) 146/84   Pulse: 89    Temp: 98.4 F (36.9 C)    SpO2: 98%    Weight: 142 lb (64.4 kg)        Physical Exam  Vitals reviewed.   Constitutional:       General: She is not in acute distress.     Appearance: Normal appearance. She is not ill-appearing or diaphoretic.   Eyes:      Extraocular Movements: Extraocular movements intact.   Cardiovascular:      Rate and Rhythm: Normal rate and regular rhythm.   Pulmonary:      Effort: Pulmonary effort is normal.   Musculoskeletal:      Right lower leg: No edema.      Left lower leg: No edema.   Skin:     Capillary Refill: Capillary refill takes less than 2 seconds.      Coloration: Skin is not pale.      Findings: No rash.   Neurological:      Mental Status: She is alert and oriented to person,  place, and time.   Psychiatric:         Mood and Affect: Mood normal.         Behavior: Behavior normal.         Thought Content: Thought content normal.            Assessment/ Plan:     1. Elevated blood pressure reading     BP re check today at 146/84; asymptomatic.  Will hold off on any treatment today as there are potential risks  Has Primary Care Provider visit this Friday  Monitor, once daily. Bring data to Primary Care Provider follow up to further discuss      Koren Bound, APRN - NP

## 2022-05-15 ENCOUNTER — Encounter

## 2022-05-16 NOTE — Telephone Encounter (Signed)
Last appointment: 11/16/21  Next appointment: 05/18/22  Previous refill encounter(s): 03/05/22 #60    Requested Prescriptions     Pending Prescriptions Disp Refills    acetaminophen-codeine (TYLENOL #3) 300-30 MG per tablet [Pharmacy Med Name: ACETAMINOPHEN/COD #3 (300/'30MG'$ ) TAB] 60 tablet 0     Sig: TAKE 1 TABLET BY MOUTH EVERY 12 HOURS AS NEEDED FOR PAIN. MAX DAILY AMOUNT: 2 TABLETS         For Pharmacy Admin Tracking Only    Program: Medication Refill  CPA in place:    Recommendation Provided To:   Intervention Detail: New Rx: 1, reason: Patient Preference  Intervention Accepted By:   Jamse Arn Closed?:    Time Spent (min): 5

## 2022-05-18 ENCOUNTER — Encounter: Attending: Family Medicine | Primary: Family Medicine

## 2022-05-18 ENCOUNTER — Ambulatory Visit
Admit: 2022-05-18 | Discharge: 2022-05-18 | Payer: MEDICARE | Attending: Student in an Organized Health Care Education/Training Program | Primary: Family Medicine

## 2022-05-18 DIAGNOSIS — G629 Polyneuropathy, unspecified: Secondary | ICD-10-CM

## 2022-05-18 DIAGNOSIS — I10 Essential (primary) hypertension: Secondary | ICD-10-CM

## 2022-05-18 MED ORDER — ROPINIROLE HCL 0.25 MG PO TABS
0.25 MG | ORAL_TABLET | Freq: Every evening | ORAL | 1 refills | Status: DC
Start: 2022-05-18 — End: 2022-05-29

## 2022-05-18 NOTE — Progress Notes (Signed)
Kimberly [name]  [phone]. [place] [address]  [phone]    Office Visit      Assessment and Plan     1. Hypertension, essential, benign  Chronic, stable.  Discussed with patient and her niece that her blood pressure is within safe limits for her age.  Discussed the dangers of over correcting blood pressure and persons greater than 86 years old.  Informed her niece to take patient's blood pressure twice a week, and if it is consistently greater than 160/110 or accompanied by frequent headache, chest pain, or shortness of breath and she will need to follow-up immediately.  Discussed that of blood pressure is temporarily elevated and asymptomatic, she should rest and recheck the blood pressure.  Of note patient also takes Lyrica, and would avoid overcorrection to reduce chance of hypotension      2. Polyneuropathy, unspecified  Chronic, uncontrolled.  Discussed with patient and her niece that she is currently on one of the highest recommended doses of Lyrica for her condition and would not recommend increasing it any further.  Discussed applying capsaicin cream to help with the pain as well as short trial of low-dose Requip at night  - rOPINIRole (REQUIP) 0.25 MG tablet; Take 1 tablet by mouth nightly  Dispense: 30 tablet; Refill: 1    3. Ganglion cyst of wrist, right  Chronic, stable.  Patient's area of concern is consistent with a ganglion cyst.  Discussed applying anti-inflammatory cream and using a gentle compression wrap to help with symptoms as needed    Patient Instructions   At the pharmacy, look for capsaicin cream and apply this to the areas that hurt. Make sure to wash your hands after applying.         Return in about 2 months (around 07/18/2022) for Follow-up with [place].         Discussed the expected course, resolution and complications of the diagnosis(es) in detail.  Medication risks, benefits, costs, interactions, and alternatives were  discussed as indicated.  Patient to contact the office if their condition worsens, changes or fails to improve. Pt verbalized understanding with the diagnosis(es) and plan.           Kimberly Morton, DO    05/18/22   1:33 PM        Subjective     CC:   Chief Complaint   Patient presents with    Hypertension     Elevated, was seen at urgent care on 05/14/2022     HPI: Kimberly [name] is a 88 y.o. female presenting to the clinic today for evaluation and/or follow-up of the following concerns: She is accompanied today by her niece who supplements the history    Hypertension:   Patient states that she recently went to the ER/urgent care and was told that her blood pressure was high.  Her niece initially sent her after she had systolics into the 093G at home.  In the ER blood pressure was in the 180s to 190s but improved with rest alone and was in the 140s at discharge.  Patient has been asymptomatic.  Denies chest pain, shortness of breath, or frequent headaches.  Occasionally will have a mild headache when waking in the morning, but this is rare.  Her niece states that her blood pressure is typically in the 140s to 160s at home  Neuropathy  Patient has a history of neuropathy which she states  mainly affects her legs but occasionally get tingling into her hands as well.  They would like to know what else she can take for this.  States that the pain is worse at night.  Lyrica helps but does not completely resolve the pain.  Lump on wrist  Patient has noticed a lump on the dorsal aspect of her right wrist which is been there for months.  Will occasionally get bigger in size and then reduce.  Causes some pain if she presses down on it.  Area seems to get smaller when she applies Biofreeze          Review of systems:   A comprehensive review of systems was negative except as written in the HPI.    History  Patient's allergies, medical, surgical, social, and family hx reviewed and available in chart. Updates made where  appropriate.       Objective     BP (!) 148/86 (Site: Right Upper Arm, Position: Sitting, Cuff Size: Large Adult)   Pulse 83   Temp 97.9 F (36.6 C) (Oral)   Resp 20   Ht '5\' 1"'$  (1.549 m)   Wt 146 lb 4.8 oz (66.4 kg)   SpO2 97%   BMI 27.64 kg/m   Physical Exam  Vitals reviewed.   Constitutional:       Appearance: She is not ill-appearing or toxic-appearing.   HENT:      Head: Normocephalic and atraumatic.   Eyes:      Conjunctiva/sclera: Conjunctivae normal.      Pupils: Pupils are equal, round, and reactive to light.   Cardiovascular:      Rate and Rhythm: Normal rate and regular rhythm.   Pulmonary:      Effort: Pulmonary effort is normal. No respiratory distress.      Breath sounds: Normal breath sounds.   Abdominal:      General: Bowel sounds are normal.      Palpations: Abdomen is soft.   Musculoskeletal:      Comments: Right dorsal wrist with a small cystlike structure but need the surface of the skin.  Cyst reduces in size with gentle massage.  Does not appear to be obstructing vasculature.  Mildly tender to palpation.  No calor or erythema   Skin:     General: Skin is warm and dry.   Neurological:      General: No focal deficit present.      Mental Status: She is alert.   Psychiatric:         Mood and Affect: Mood normal.         Behavior: Behavior normal.          No results found for this or any previous visit (from the past 24 hour(s)).

## 2022-05-18 NOTE — Patient Instructions (Addendum)
At the pharmacy, look for capsaicin cream and apply this to the areas that hurt. Make sure to wash your hands after applying.

## 2022-05-18 NOTE — Progress Notes (Signed)
Chief Complaint   Patient presents with    Hypertension     Elevated, was seen at urgent care on 05/14/2022     "Have you been to the ER, urgent care clinic since your last visit?  Hospitalized since your last visit?"    YES - When: approximately 3 days ago.  Where and Why: [facility] "Elevated BP".    "Have you seen or consulted any other health care providers outside of [facility] since your last visit?"    NO

## 2022-05-23 ENCOUNTER — Encounter: Attending: Surgery | Primary: Family Medicine

## 2022-05-23 ENCOUNTER — Ambulatory Visit: Admit: 2022-05-23 | Discharge: 2022-05-23 | Payer: MEDICARE | Attending: Surgery | Primary: Family Medicine

## 2022-05-23 DIAGNOSIS — Z171 Estrogen receptor negative status [ER-]: Secondary | ICD-10-CM

## 2022-05-23 NOTE — Progress Notes (Signed)
Subjective:      Patient ID: Kimberly [name] is a 88 y.o. female who comes in for breast cancer follow up      Chief Complaint   Patient presents with    Follow-up     Breast cancer       HPI     She went for screening mammogram and was noted to have a density in the UOQ of the left breast.  Subsequently she had an [place] confirming a 2.0 x 1.8 x 1.6 cm in the 0100 position 6 cm from nipple.   Core biopsy 01/21/2018 demonstrated a poorly differentiated  carcinoma compatible with IDC G3 triple negative.  She had not felt a lump, skin changes, nipple changes or drainage, unexplained weight loss or bone pain.  She had menarche at 70, only pregnancy at 49, menopause at 87 and did not take HRT.  Her MGM,  M, D, and sister all died from metastatic breast cancer.   She underwent a left lumpectomy for a T2NxMx IDC 03/10/2018.  She received adjuvant WBRT Briscoe Burns).  She had a bilateral 3D dx mammogram 03/23/2021 was BIRADS 2.    Past Medical History:   Diagnosis Date    Adverse effect of anesthesia     slow to wake    Anginal pain ([place])     Arthritis     Breast cancer ([place]) 03/10/2018     left lumpectomy    Cancer ([place]) 01/2018    breast    Chronic pain     neuropathy    Constipation     GERD (gastroesophageal reflux disease)     Headache     Hearing loss     High cholesterol     Hypertension     Incontinence     Joint pain     Memory disorder     Muscle pain     Osteoporosis     Ringing in the ears     Snoring     Visual disturbance      Past Surgical History:   Procedure Laterality Date    BREAST LUMPECTOMY Left 03/10/2018    LEFT BREAST LUMPECTOMY WITH ULTRASOUND GUIDANCE performed by Luellen Pucker, MD at [facility]    HYSTERECTOMY (CERVIX STATUS UNKNOWN)      ORTHOPEDIC SURGERY      epidural pain mtg - pt unsure    OTHER SURGICAL HISTORY Right 10/15/2016    ligament correction right foot    OTHER SURGICAL HISTORY  11/26/2016    Pituitary tumor removal    US BREAST BIOPSY W LOC DEVICE 1ST LESION LEFT Left 01/21/2018     US BREAST NEEDLE BIOPSY LEFT 01/21/2018 [facility] RAD [place]     Family History   Problem Relation Age of Onset    Dementia Sister     Dementia Brother     Cancer Daughter     Breast Cancer Daughter 48    Breast Cancer Sister 31    Breast Cancer Mother 44    No Known Problems Father      Social History     Tobacco Use    Smoking status: Never    Smokeless tobacco: Never   Vaping Use    Vaping Use: Never used   Substance Use Topics    Alcohol use: Not Currently    Drug use: No     Current Outpatient Medications   Medication Sig    pregabalin (LYRICA) 200 MG capsule Take 1  capsule by mouth 2 times daily for 180 days. Max Daily Amount: 400 mg    acetaminophen (TYLENOL) 500 MG tablet Take by mouth every 6 hours as needed    aspirin 81 MG chewable tablet Take by mouth daily    ammonium lactate (LAC-HYDRIN) 12 % lotion Apply 1 Application topically 2 times daily as needed    ascorbic acid (VITAMIN C) 100 MG tablet Take 1 tablet by mouth daily    chlorhexidine (PERIDEX) 0.12 % solution Swish and spit 15 mLs  in the morning and 15 mLs in the evening.    rOPINIRole (REQUIP) 0.25 MG tablet Take 1 tablet by mouth nightly     No current facility-administered medications for this visit.     Allergies   Allergen Reactions    Penicillins Hives and Myalgia    Sulfa Antibiotics      Other reaction(s): Unknown (comments)         Review of Systems   Constitutional:  Negative for chills, diaphoresis, fatigue, fever and unexpected weight change.   HENT:  Positive for hearing loss. Negative for congestion, ear pain and sore throat.    Eyes:  Positive for visual disturbance. Negative for pain.   Respiratory:  Negative for cough, shortness of breath, wheezing and stridor.    Cardiovascular:  Negative for chest pain, palpitations and leg swelling.   Gastrointestinal:  Positive for constipation. Negative for abdominal pain, blood in stool, diarrhea, nausea and vomiting.   Endocrine: Negative for polydipsia.   Genitourinary:  Negative for dysuria,  flank pain, frequency, hematuria and urgency.   Musculoskeletal:  Positive for gait problem (uses walker). Negative for arthralgias, back pain and myalgias.   Neurological:  Positive for numbness (finger tips). Negative for dizziness, seizures, weakness and headaches.   Psychiatric/Behavioral:  Negative for behavioral problems. The patient is not nervous/anxious.          BP 136/82 (Site: Left Upper Arm, Position: Sitting)   Pulse 77   Temp 97.9 F (36.6 C) (Oral)   Resp 20   Ht 1.549 m ('5\' 1"'$ )   Wt 64.2 kg (141 lb 9.6 oz)   SpO2 97%   BMI 26.76 kg/m     Objective:   Physical Exam  Vitals and nursing note reviewed. Exam conducted with a chaperone present.   Constitutional:       General: She is not in acute distress.     Appearance: Normal appearance. She is well-developed. She is not ill-appearing or diaphoretic.   HENT:      Head: Normocephalic and atraumatic.   Eyes:      General: No scleral icterus.     Extraocular Movements: Extraocular movements intact.      Conjunctiva/sclera: Conjunctivae normal.      Pupils: Pupils are equal, round, and reactive to light.   Neck:      Thyroid: No thyroid mass or thyromegaly.      Trachea: Trachea and phonation normal. No tracheal deviation.   Cardiovascular:      Rate and Rhythm: Normal rate and regular rhythm.      Heart sounds: Normal heart sounds. No murmur heard.     No friction rub. No gallop.   Pulmonary:      Effort: Pulmonary effort is normal. No respiratory distress.      Breath sounds: Normal breath sounds. No stridor. No wheezing or rales.   Chest:   Breasts:     Breasts are symmetrical.      Right: No  inverted nipple, mass, nipple discharge, skin change or tenderness.      Left: No inverted nipple, mass, nipple discharge, skin change or tenderness.       Abdominal:      General: Bowel sounds are normal. There is no distension.      Palpations: There is no mass.      Tenderness: There is no abdominal tenderness. There is no guarding or rebound.       Hernia: No hernia is present.   Musculoskeletal:         General: No swelling or tenderness. Normal range of motion.      Cervical back: Normal range of motion and neck supple.   Lymphadenopathy:      Cervical: No cervical adenopathy.      Upper Body:      Right upper body: No supraclavicular or axillary adenopathy.      Left upper body: No supraclavicular or axillary adenopathy.   Skin:     Coloration: Skin is not jaundiced.      Findings: No erythema or rash.   Neurological:      Mental Status: She is alert and oriented to person, place, and time.      Cranial Nerves: No cranial nerve deficit.      Coordination: Coordination normal.      Gait: Gait normal.   Psychiatric:         Behavior: Behavior normal.         Thought Content: Thought content normal.         Judgment: Judgment normal.             Assessment / Plan:       1.  Left breast cancer early stage, Clinically stage 2A (T2N0M0). Triple negative Dx 01/2018.   NED at 6 years and 4 months   2.  Strong family hx breast cancer      3.  Allergy to PCN with hives and upset stomach.  Ok for Ancef   4.  Essential hypertension.  On rx   5.  COVID-19.  Got first dose of vaccine   6.  Social.   Here with with aide today   Lives with niece and has an aide that comes to the house      We discussed stopping mammograms but she wishes to continue with them      Bilateral 3D dx mammogram is overdue (we discussed stopping mammograms but she wishes to continue them)      RTC 6 months      Luellen Pucker, MD FACS

## 2022-05-23 NOTE — Progress Notes (Signed)
Identified pt with two pt identifiers(name and DOB). Reviewed record in preparation for visit and have obtained necessary documentation. All patient medications has been reviewed.    Chief Complaint   Patient presents with    Follow-up     Breast cancer       Health Maintenance Due   Topic    Shingles vaccine (1 of 2)    Pneumococcal 65+ years Vaccine (2 - PCV)    COVID-19 Vaccine (3 - Pfizer series)    Flu vaccine (1)       Vitals:    05/23/22 1328   BP: 136/82   Site: Left Upper Arm   Position: Sitting   Pulse: 77   Resp: 20   Temp: 97.9 F (36.6 C)   TempSrc: Oral   SpO2: 97%   Weight: 64.2 kg (141 lb 9.6 oz)   Height: 1.549 m ('5\' 1"'$ )         4.Have you been to the ER, urgent care clinic since your last visit?  Hospitalized since your last visit? No      5. Have you seen or consulted any other health care providers outside of the [facility] since your last visit?  Include any pap smears or colon screening. No      Patient is accompanied by nursing attendant I have received verbal consent from [name] to discuss any/all medical information while they are present in the room.

## 2022-05-24 ENCOUNTER — Encounter

## 2022-05-24 NOTE — Telephone Encounter (Signed)
Last Seen: 05/18/2022 Dr . Ronnald Ramp    Next Appt. 07/20/2022

## 2022-05-24 NOTE — Telephone Encounter (Signed)
Patients care giver Rollene Fare is calling to get the medication acetaminophen-codeine (TYLENOL #3) 300-30 MG per tablet called in.  If any questions please give her a call @ [phone]

## 2022-05-24 NOTE — Telephone Encounter (Signed)
No refill given at this time. Last time this was prescribed was back in July 2023. Needs eval for refill of a controlled medication

## 2022-05-28 ENCOUNTER — Encounter

## 2022-05-28 ENCOUNTER — Inpatient Hospital Stay
Admit: 2022-05-28 | Discharge: 2022-05-28 | Disposition: A | Discharge status: Home / Self Care | Payer: MEDICARE | Attending: Emergency Medicine

## 2022-05-28 DIAGNOSIS — G63 Polyneuropathy in diseases classified elsewhere: Secondary | ICD-10-CM

## 2022-05-28 DIAGNOSIS — G629 Polyneuropathy, unspecified: Secondary | ICD-10-CM

## 2022-05-28 MED ORDER — ACETAMINOPHEN-CODEINE 300-30 MG PO TABS
300-30 MG | ORAL_TABLET | Freq: Three times a day (TID) | ORAL | 0 refills | Status: DC | PRN
Start: 2022-05-28 — End: 2022-05-28

## 2022-05-28 MED ORDER — KETOROLAC TROMETHAMINE 30 MG/ML IJ SOLN
30 MG/ML | INTRAMUSCULAR | Status: AC
Start: 2022-05-28 — End: 2022-05-28
  Administered 2022-05-28: 23:00:00 15 mg via INTRAMUSCULAR

## 2022-05-28 MED ORDER — DEXAMETHASONE 4 MG PO TABS
4 MG | ORAL | Status: AC
Start: 2022-05-28 — End: 2022-05-28
  Administered 2022-05-28: 23:00:00 10 mg via ORAL

## 2022-05-28 MED ORDER — ACETAMINOPHEN-CODEINE 300-30 MG PO TABS
300-30 MG | ORAL | Status: AC
Start: 2022-05-28 — End: 2022-05-28
  Administered 2022-05-28: 23:00:00 2 via ORAL

## 2022-05-28 MED ORDER — ACETAMINOPHEN-CODEINE 300-30 MG PO TABS
300-30 MG | ORAL_TABLET | Freq: Three times a day (TID) | ORAL | 0 refills | Status: AC | PRN
Start: 2022-05-28 — End: 2022-06-27

## 2022-05-28 MED FILL — ACETAMINOPHEN-CODEINE 300-30 MG PO TABS: 300-30 MG | ORAL | Qty: 2

## 2022-05-28 MED FILL — KETOROLAC TROMETHAMINE 30 MG/ML IJ SOLN: 30 MG/ML | INTRAMUSCULAR | Qty: 1

## 2022-05-28 MED FILL — DEXAMETHASONE 4 MG PO TABS: 4 MG | ORAL | Qty: 3

## 2022-05-28 NOTE — Discharge Instructions (Addendum)
Thank you for allowing us to provide you with medical care today.  We realize that you have many choices for your emergency care needs.  We thank you for choosing [facility].  Please choose us in the future for any continued health care needs.     We hope we addressed all of your medical concerns. We strive to provide excellent quality care in the Emergency Department.  Anything less than excellent does not meet our expectations.     The exam and treatment you received in the Emergency Department were for an emergent problem and are not intended as complete care. It is important that you follow up with a doctor, nurse practitioner, or physician's assistant for ongoing care. If your symptoms worsen or you do not improve as expected and you are unable to reach your usual health care provider, you should return to the Emergency Department. We are available 24 hours a day.     Take this sheet with you when you go to your follow-up visit.     If you have any problem arranging the follow-up visit, contact the Emergency Department immediately.     Make an appointment your family doctor for follow up of this visit. Return to the ER if you are unable to be seen in a timely manner.

## 2022-05-28 NOTE — Progress Notes (Signed)
6:27 PM  Called x2 WNR.   Ricarda Frame, APRN - NP

## 2022-05-28 NOTE — Telephone Encounter (Signed)
Patients niece stated that she was seen by Dr. Ronnald Ramp 05/18/2022 and forgot to ask for refills on: acetaminophen-codeine (TYLENOL #3) 300-30 MG per tablet.

## 2022-05-28 NOTE — Telephone Encounter (Signed)
Kimberly [name] patient care giver calling to check the status on RX acetaminophen-codeine (TYLENOL #3) 300-30 MG per tablet she can be reached [phone]

## 2022-05-28 NOTE — Telephone Encounter (Signed)
Will provide 15 tablets for severe pain only

## 2022-05-28 NOTE — ED Provider Notes (Signed)
[facility] EMERGENCY [place]      Pt Name: Kimberly [name]  MRN: [id number]  [place] [birth year]  Date of evaluation: 05/28/2022  Provider: Ricarda Frame, APRN - NP    [place]       Chief Complaint   Patient presents with    Peripheral Neuropathy         HISTORY OF PRESENT ILLNESS   (Location/Symptom, Timing/Onset, Context/Setting, Quality, Duration, Modifying Factors, Severity)  Note limiting factors.   The history is provided by the patient and a caregiver. No language interpreter was used.       Kimberly [name] is a 88 y.o. female with Hx of breast CA, PVD,CKD, memory disorder,insomnia,  HTN, peripheral neuropathy, GERD, HTN, HA who presents ambulatory w/ caregiver to [facility] ED with cc of BLLE pain.     Pt reports "burning" BLLE pain over the course of the last few weeks in the setting of chronic pain secondary to peripheral neuropathy. Takes Lyrica daily, out of Tylenol with Codeine-caregiver states this normally manages the patient's pain well.  She called the patient's primary care office and has not gotten a refill.  Patient is having difficulty sleeping secondary to the pain in her legs.  Denies any swelling to the lower extremities, wounds, change in presentation of skin color, falls or injuries.    No recent F/C, N/V/D, cough, congestion, CP, SOB, urinary or bowel habit changes.  No reported tobacco, alcohol, substance abuse.        PCP: Cathlyn Parsons, MD    There are no other complaints, changes or physical findings at this time.        Review of External Medical Records:     Nursing Notes were reviewed.    REVIEW OF SYSTEMS    (2-9 systems for level 4, 10 or more for level 5)     Review of Systems   Constitutional:  Negative for activity change, appetite change and fever.   HENT:  Negative for congestion and rhinorrhea.    Eyes:  Negative for visual disturbance.   Respiratory:  Negative for shortness of breath.    Cardiovascular:  Negative for chest pain and leg  swelling.   Gastrointestinal:  Negative for abdominal pain, diarrhea, nausea and vomiting.   Genitourinary:  Negative for difficulty urinating.   Musculoskeletal:  Positive for arthralgias and myalgias.   Skin:  Negative for color change and rash.   Neurological:  Negative for weakness and headaches.       Except as noted above the remainder of the review of systems was reviewed and negative.       PAST MEDICAL HISTORY     Past Medical History:   Diagnosis Date    Adverse effect of anesthesia     slow to wake    Anginal pain ([place])     Arthritis     Breast cancer ([place]) 03/10/2018     left lumpectomy    Cancer ([address]) 01/2018    breast    Chronic pain     neuropathy    Constipation     GERD (gastroesophageal reflux disease)     Headache     Hearing loss     High cholesterol     Hypertension     Incontinence     Joint pain     Memory disorder     Muscle pain     Osteoporosis     Ringing in the ears  Snoring     Visual disturbance          SURGICAL HISTORY       Past Surgical History:   Procedure Laterality Date    BREAST LUMPECTOMY Left 03/10/2018    LEFT BREAST LUMPECTOMY WITH ULTRASOUND GUIDANCE performed by Luellen Pucker, MD at [place] (CERVIX STATUS UNKNOWN)      ORTHOPEDIC SURGERY      epidural pain mtg - pt unsure    OTHER SURGICAL HISTORY Right 10/15/2016    ligament correction right foot    OTHER SURGICAL HISTORY  11/26/2016    Pituitary tumor removal    US BREAST BIOPSY W LOC DEVICE 1ST LESION LEFT Left 01/21/2018    US BREAST NEEDLE BIOPSY LEFT 01/21/2018 [facility] [place]         CURRENT MEDICATIONS       Discharge Medication List as of 05/28/2022  7:56 PM        CONTINUE these medications which have NOT CHANGED    Details   ammonium lactate (LAC-HYDRIN) 12 % lotion Apply 1 Application topically 2 times daily as needed, Topical, 2 TIMES DAILY PRN Starting Thu 03/29/2022, Historical Med      ascorbic acid (VITAMIN C) 100 MG tablet Take 1 tablet by mouth daily as neededHistorical Med       chlorhexidine (PERIDEX) 0.12 % solution Swish and spit 15 mLs  in the morning and 15 mLs in the evening.Historical Med      rOPINIRole (REQUIP) 0.25 MG tablet Take 1 tablet by mouth nightly, Disp-30 tablet, R-1Normal      pregabalin (LYRICA) 200 MG capsule Take 1 capsule by mouth 2 times daily for 180 days. Max Daily Amount: 400 mg, Disp-60 capsule, R-5Normal      acetaminophen (TYLENOL) 500 MG tablet Take by mouth every 6 hours as neededHistorical Med      aspirin 81 MG chewable tablet Take by mouth dailyHistorical Med             ALLERGIES     Penicillins and Sulfa antibiotics    FAMILY HISTORY       Family History   Problem Relation Age of Onset    Dementia Sister     Dementia Brother     Cancer Daughter     Breast Cancer Daughter 33    Breast Cancer Sister 53    Breast Cancer Mother 68    No Known Problems Father           SOCIAL HISTORY       Social History     Socioeconomic History    Marital status: Widowed   Tobacco Use    Smoking status: Never    Smokeless tobacco: Never   Vaping Use    Vaping Use: Never used   Substance and Sexual Activity    Alcohol use: Not Currently    Drug use: No     Social Determinants of Health     Financial Resource Strain: Low Risk  (05/18/2022)    Overall Financial Resource Strain (CARDIA)     Difficulty of Paying Living Expenses: Not hard at all   Food Insecurity: No Food Insecurity (05/18/2022)    Hunger Vital Sign     Worried About Running Out of Food in the Last Year: Never true     Ran Out of Food in the Last Year: Never true   Transportation Needs: No Transportation Needs (05/18/2022)    PRAPARE -  Armed forces logistics/support/administrative officer (Medical): No     Lack of Transportation (Non-Medical): No   Physical Activity: Inactive (07/14/2021)    Exercise Vital Sign     Days of Exercise per Week: 0 days     Minutes of Exercise per Session: 0 min   Stress: No Stress Concern Present (07/14/2021)    [place]      Feeling of Stress : Not at all   Social Connections: Socially Isolated (07/14/2021)    Social Connection and Isolation Panel [NHANES]     Frequency of Communication with Friends and Family: Once a week     Frequency of Social Gatherings with Friends and Family: Once a week     Attends Religious Services: 1 to 4 times per year     Active Member of [employer] or Organizations: No     Attends Archivist Meetings: Never     Marital Status: Widowed   Intimate Partner Violence: Not At Risk (07/14/2021)    Humiliation, Afraid, Rape, and Kick questionnaire     Fear of Current or Ex-Partner: No     Emotionally Abused: No     Physically Abused: No     Sexually Abused: No   Housing Stability: Low Risk  (05/18/2022)    Housing Stability Vital Sign     Unable to Pay for Housing in the Last Year: No     Number of [place] in the Last Year: 1     Unstable Housing in the Last Year: No           PHYSICAL EXAM    (up to 7 for level 4, 8 or more for level 5)     ED Triage Vitals   BP Temp Temp src Pulse Resp SpO2 Height Weight   -- -- -- -- -- -- -- --       Body mass index is 27.08 kg/m.    Physical Exam  Vitals and nursing note reviewed.   Constitutional:       Appearance: Normal appearance.   HENT:      Head: Normocephalic.      Right Ear: External ear normal.      Left Ear: External ear normal.      Nose: Nose normal.      Mouth/Throat:      Mouth: Mucous membranes are moist.   Eyes:      Extraocular Movements: Extraocular movements intact.      Conjunctiva/sclera: Conjunctivae normal.      Pupils: Pupils are equal, round, and reactive to light.   Cardiovascular:      Rate and Rhythm: Normal rate and regular rhythm.   Pulmonary:      Effort: Pulmonary effort is normal.      Breath sounds: Normal breath sounds.   Abdominal:      General: Abdomen is flat.   Musculoskeletal:         General: No swelling, tenderness or signs of injury. Normal range of motion.      Cervical back: Normal range of motion and neck supple.    Skin:     General: Skin is warm and dry.      Capillary Refill: Capillary refill takes less than 2 seconds.   Neurological:      General: No focal deficit present.      Mental Status: She is alert and oriented to person, place, and time.  Psychiatric:         Mood and Affect: Mood normal.         Behavior: Behavior normal.         DIAGNOSTIC RESULTS     EKG: All EKG's are interpreted by the Emergency Department Physician who either signs or Co-signs this chart in the absence of a cardiologist.        RADIOLOGY:   Non-plain film images such as CT, Ultrasound and MRI are read by the radiologist. Plain radiographic images are visualized and preliminarily interpreted by the emergency physician with the below findings:        Interpretation per the Radiologist below, if available at the time of this note:    No orders to display        LABS:  Labs Reviewed - No data to display    All other labs were within normal range or not returned as of this dictation.    EMERGENCY DEPARTMENT COURSE and DIFFERENTIAL DIAGNOSIS/MDM:   Vitals:    Vitals:    05/28/22 1830   BP: (!) 162/89   Pulse: 83   Resp: 18   Temp: 98 F (36.7 C)   TempSrc: Temporal   SpO2: 99%   Weight: 65 kg (143 lb 4.8 oz)   Height: 1.549 m ('5\' 1"'$ )           Medical Decision Making  Differential diagnosis includes peripheral neuropathy, chronic pain    Patient presents to the emergency department with her caregiver with concern for atraumatic bilateral lower extremity burning pain that is classic for peripheral neuropathy.  Caregiver states the patient normally takes Tylenol with codeine for breakthrough pain, but has been out of it for quite some time.  Patient has not had any infectious symptoms, skin color changes consistent with cellulitis, change in temperature to the legs or changes in capacity to ambulate.  She has not had any swelling, with concern for possible vascular injury.  Patient was given pain medication in the emergency department and reported  drastic improvement of pain.  Referred to primary care provider for ongoing management of symptoms.    Presentation, management, and disposition were discussed with the attending physician, Dr. Margorie John who is in agreement with plan of care.      Risk  Prescription drug management.            REASSESSMENT            CONSULTS:  None    PROCEDURES:  Unless otherwise noted below, none     Procedures      FINAL IMPRESSION      1. Polyneuropathy associated with underlying disease ([place])    2. Radicular low back pain          DISPOSITION/PLAN   DISPOSITION Discharge - Pending Orders Complete 05/28/2022 07:43:58 PM      PATIENT REFERRED TO:  Cathlyn Parsons, MD  [address]  [place] [address]  [phone]    Schedule an appointment as soon as possible for a visit       [facility] EMERGENCY [place]  [phone]  Go to   As needed, If symptoms worsen      DISCHARGE MEDICATIONS:  Discharge Medication List as of 05/28/2022  7:56 PM            (Please note that portions of this note were completed with a voice recognition program.  Efforts were made to  edit the dictations but occasionally words are mis-transcribed.)    Ricarda Frame, APRN - NP (electronically signed)  Emergency Attending Physician / Physician Assistant / Nurse Practitioner             Donzetta Kohut, APRN - NP  05/29/22 [phone]

## 2022-05-28 NOTE — ED Triage Notes (Signed)
Pt c/o bilateral lower leg pain and burning. Has tried multiple OTC options with minimal relief. Hx neuropathy.

## 2022-05-29 ENCOUNTER — Ambulatory Visit: Admit: 2022-05-29 | Payer: MEDICARE | Attending: Neurology | Primary: Family Medicine

## 2022-05-29 DIAGNOSIS — D352 Benign neoplasm of pituitary gland: Secondary | ICD-10-CM

## 2022-05-29 MED ORDER — DULOXETINE HCL 30 MG PO CPEP
30 MG | ORAL_CAPSULE | Freq: Every day | ORAL | 3 refills | Status: AC
Start: 2022-05-29 — End: 2022-07-25

## 2022-05-29 NOTE — Progress Notes (Signed)
Consult  REFERRED BY:  Cathlyn Parsons, MD    CHIEF COMPLAINT: Progressive burning pain in her hands and feet, which required her to go to the emergency room for treatment and get an injection for pain and she was referred to [place] on work in basis on urgent emergency evaluation.      Subjective:     Kimberly [name] is a 88 y.o. right-handed African-American female we are seeing as a new patient, at the request of Dr. Doyce Para, on urgent work in an emergency basis after being seen in the ER last night for severe burning pain in her hands and feet that she has had for at least 8 or 9 years, and seems to be getting worse and worse.  She been followed by several neurologist in the past, but they have left the practice.  She was diagnosed with neuropathy, but no real EMG test or metabolic screens were ever done as best I can tell, the cause of her neuropathy is completely unclear.  She is taking Lyrica 200 mg twice a day with some improvement, but seems to be breaking through.  She has bilateral foot drop of unclear etiology, and I guess this may be related to the neuropathy.  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  She has a past history of breast cancer, previous lumpectomy, chronic neuropathic pain, hyperlipidemia, hypertension, but apparently is not a diabetic.  She has a pituitary tumor also.  She has had back problems and received pain management and epidural steroids from previous pain management doctors.  She takes Lidoderm patches as recommended by Dr. Meriam Sprague.  She has tried topical pain relievers in addition also of multiple types.  She has had syncope in the past and memory loss, and probably has a mild dementia at her age.  She has significant arthritis in her neck and back.   She has a very complicated history and all of her past history was reviewed in detail and previous MRI scans, and her neurology notes and medical notes, but no neurosurgery notes are noted, and she thinks that may be because she thinks she might have been operated on at [facility], she really cannot remember due to her memory problems.  Despite the enlarging pituitary macroadenoma, she does not complain of headaches or new visual symptoms now, but her vision does seem to be impaired    Past Medical History:   Diagnosis Date    Adverse effect of anesthesia     slow to wake    Anginal pain (HCC)     Arthritis     Breast cancer ([place]) 03/10/2018     left lumpectomy    Cancer ([place]) 01/2018    breast    Chronic pain     neuropathy    Constipation     GERD (gastroesophageal reflux disease)     Headache     Hearing loss     High cholesterol     Hypertension     Incontinence     Joint pain     Memory disorder     Muscle pain     Osteoporosis     Ringing in the ears     Snoring  Visual disturbance       Past Surgical History:   Procedure Laterality Date    BREAST LUMPECTOMY Left 03/10/2018    LEFT BREAST LUMPECTOMY WITH ULTRASOUND GUIDANCE performed by Luellen Pucker, MD at [facility]    HYSTERECTOMY (CERVIX STATUS UNKNOWN)      ORTHOPEDIC SURGERY      epidural pain mtg - pt unsure    OTHER SURGICAL HISTORY Right 10/15/2016    ligament correction right foot    OTHER SURGICAL HISTORY  11/26/2016    Pituitary tumor removal    US BREAST BIOPSY W LOC DEVICE 1ST LESION LEFT Left 01/21/2018    US BREAST NEEDLE BIOPSY LEFT 01/21/2018 [facility] [place]     Family History   Problem Relation Age of Onset    Dementia Sister     Dementia Brother     Cancer Daughter     Breast Cancer Daughter 38    Breast Cancer Sister 26    Breast Cancer Mother 54    No Known Problems Father       Social History     Tobacco Use    Smoking status: Never    Smokeless tobacco: Never   Substance Use Topics    Alcohol use: Not Currently          Current Outpatient Medications:     b complex vitamins capsule, Take 1 capsule by mouth daily, Disp: , Rfl:     Cholecalciferol (VITAMIN D3) 50 MCG (2000 UT) CAPS, Take by mouth, Disp: , Rfl:     DULoxetine (CYMBALTA) 30 MG extended release capsule, Take 1 capsule by mouth daily, Disp: 30 capsule, Rfl: 3    acetaminophen-codeine (TYLENOL #3) 300-30 MG per tablet, Take 1 tablet by mouth every 8 hours as needed (Severe breakthrough pain) for up to 30 days. Max Daily Amount: 3 tablets, Disp: 15 tablet, Rfl: 0    ammonium lactate (LAC-HYDRIN) 12 % lotion, Apply 1 Application topically 2 times daily as needed, Disp: , Rfl:     ascorbic acid (VITAMIN C) 100 MG tablet, Take 1 tablet by mouth daily as needed, Disp: , Rfl:     pregabalin (LYRICA) 200 MG capsule, Take 1 capsule by mouth 2 times daily for 180 days. Max Daily Amount: 400 mg, Disp: 60 capsule, Rfl: 5    acetaminophen (TYLENOL) 500 MG tablet, Take by mouth every 6 hours as needed, Disp: , Rfl:     aspirin 81 MG chewable tablet, Take by mouth daily, Disp: , Rfl:         Allergies   Allergen Reactions    Penicillins Hives and Myalgia    Sulfa Antibiotics      Other reaction(s): Unknown (comments)      MRI Results (most recent):  '@BSHSILASTIMGCAT'$ (ZDG6440:3)@    '@BSHSILASTIMGCAT'$ (KVQ2595:6)@  Review of Systems:  A comprehensive review of systems was negative except for: Constitutional: positive for fatigue and malaise  Musculoskeletal: positive for arthralgias, back pain, muscle weakness, myalgias, neck pain, and stiff joints  Neurological: positive for coordination problems, gait problems, memory problems, paresthesia, and weakness  Behvioral/Psych: positive for anxiety, depression, and dementia    Vitals:    05/29/22 1531 05/29/22 1610   BP: (!) 140/60 136/80   Site: Right Upper Arm    Position: Sitting    Cuff Size: Medium Adult    Pulse: 89    Resp: 17    Temp: 97.8 F (36.6 C)    TempSrc: Temporal  SpO2: 98%    Weight: 64.9 kg (143 lb)    Height: 1.549 m  ('5\' 1"'$ )      Objective:     I      NEUROLOGICAL EXAM:    Appearance:  The patient is poorly developed, well nourished, provides a poor history and is in no acute distress.   Mental Status: Oriented to place and person, and the president, but not the date, and she is a somewhat poor historian, and cognitive function is mildly abnormal and speech is fluent and no aphasia or dysarthria. Mood and affect appropriate.   Cranial Nerves:   Intact visual fields. Fundi are poorly seen. PERLA, EOM's full, no nystagmus, no ptosis. Facial sensation is normal. Corneal reflexes are not tested. Facial movement is asymmetric. Hearing is normal bilaterally. Palate is midline with normal sternocleidomastoid and trapezius muscles are normal. Tongue is midline.  Neck without meningismus or bruits  Temporal arteries are not tender or enlarged  TMJ areas are not tender on palpation   Motor:  3-4/5 strength in upper and lower proximal and distal muscles except that she has severe foot drop and distal weakness in both legs and has to wear bilateral AFOs, possibly due to neuropathy or lumbar radiculopathy.  Reduced bulk and tone. No fasciculations.  Rapid alternating movement is symmetric and slow bilaterally   Reflexes:   Deep tendon reflexes 2+/4 and symmetrical.  No babinski or clonus present   Sensory:   Abnormal to touch, pinprick and vibration and temperature to about knee level.  DSS is intact   Gait:  Abnormal gait for patient's age as she has to use a walker and bilateral AFO braces.   Tremor:   No tremor noted.   Cerebellar:  Moderately abnormal cerebellar signs present on Romberg and tandem testing and finger-nose-finger exam.   Neurovascular:  Normal heart sounds and regular rhythm, peripheral pulses decreased, and no carotid bruits.           Assessment:      Diagnosis Orders   1. Pituitary macroadenoma with extrasellar extension [facility])  MRI BRAIN W WO CONTRAST      2. Disturbance of memory  MRI BRAIN W WO CONTRAST      3.  History of surgical removal of pituitary gland [facility])  MRI BRAIN W WO CONTRAST      4. B12 deficiency  Vitamin B12 & Folate      5. Vitamin D deficiency  Vitamin D 25 Hydroxy      6. Idiopathic small fiber peripheral neuropathy  Vitamin D 25 Hydroxy    Vitamin B12 & Folate    TSH    IFE and PE, Serum    GM1 IgG Autoantibodies    Anti Neuronal AB, Serum    ANA Comprehensive Plus Panel    Myelin Associated Glycoprotein    EMG    MRI BRAIN W WO CONTRAST    Hemoglobin A1C    DULoxetine (CYMBALTA) 30 MG extended release capsule      7. Diabetic peripheral neuropathy associated with type 2 diabetes mellitus (HCC)  Hemoglobin A1C      8. Degenerative cervical spinal stenosis  MRI CERVICAL SPINE WO CONTRAST        Active Problems:    * No active hospital problems. *  Resolved Problems:    * No resolved hospital problems. *      Plan:     Patient with complicated problem of severe burning pain in her hands and  feet, my concern is this may be partly related to her cervical disease which was described as moderate to severe, on MRI of the brain, and we need to check an EMG study to evaluate her for neuropathy, because none was done in the past, and send off multiple metabolic panels to rule out any other cause of neuropathy, check an MRI of the cervical spine to see if there is any cause there for all of her neuropathic pain, because she is a candidate at her age for severe spinal stenosis if she does have a lot of neck pain and generalized weakness.  In addition she has the enlarging pituitary lesion 2 years ago on the MRI scan, which has not been followed up on that we will get an MRI of the brain in addition.  She may need an MRI scan of the lumbar spine or thoracic spine next, if no other cause can be found for her symptoms to be so severe at this time.  We discussed this in detail with the patient and her daughter, and we will try her on Cymbalta to try to help control the pain until we get an answer for her problems, and  try to hold off on narcotics at this time, and may have to advance the Lyrica to 200 mg 3 times a day but at her age I am a little leery of that.  She is encouraged to take a multivitamin and vitamin D every day, stay physically mentally active, try to exercise some every day, and try some physical therapy but the patient says no she does not want the therapy she says she can just do her own exercises at home.  55 minutes spent reviewing her records in detail on the chart, going over all her MRI scans, reviewing all of her previous neurology notes and work-ups, and trying to decipher what kind of neuropathy she does have and what test had been done and what needs to be done.  We encouraged him to try to eat a Mediterranean type diet in addition to take the vitamins as above.  We will see her again in 3 months time we will check MyChart for results of her test, or call us for results, and this is a very difficult case with multiple problems, which could be serious and life-threatening if she has a pituitary tumor or has falls or progressive weakness and may be even possibly neuromuscular disease which could be fatal we will get an EMG study to evaluate all this.      Signed By: Danton Sewer, MD     May 29, 2022       CC: Cathlyn Parsons, MD  FAX: [phone]

## 2022-05-30 ENCOUNTER — Encounter

## 2022-05-30 MED ORDER — ACETAMINOPHEN-CODEINE 300-30 MG PO TABS
300-30 MG | ORAL_TABLET | ORAL | 0 refills | Status: AC
Start: 2022-05-30 — End: 2022-06-29

## 2022-05-31 LAB — VITAMIN B12 & FOLATE
Folate: 13 ng/mL (ref 5.0–21.0)
Vitamin B-12: 751 pg/mL (ref 193–986)

## 2022-05-31 LAB — TSH: TSH, 3RD GENERATION: 0.89 u[IU]/mL (ref 0.36–3.74)

## 2022-05-31 LAB — HEMOGLOBIN A1C
Hemoglobin A1C: 5.2 % (ref 4.0–5.6)
eAG: 103 mg/dL

## 2022-05-31 LAB — VITAMIN D 25 HYDROXY: Vit D, 25-Hydroxy: 40.2 ng/mL (ref 30–100)

## 2022-06-01 LAB — ANA COMPREHENSIVE PLUS PANEL
Anti-RNP: <0.2 AI (ref 0.0–0.9)
CENTROMERE PROTEIN B ANTIBODY: <0.2 AI (ref 0.0–0.9)
Chromatin Antibody: <0.2 AI (ref 0.0–0.9)
ENA JO-1 Ab: <0.2 AI (ref 0.0–0.9)
ENA SCL-70 Ab: <0.2 AI (ref 0.0–0.9)
ENA SSA (RO) Ab: <0.2 AI (ref 0.0–0.9)
ENA SSB (LA) Ab: <0.2 AI (ref 0.0–0.9)
ENA Smith (SM) Ab: <0.2 AI (ref 0.0–0.9)
RIBOSOMAL P ANTIBODY, 9934283: <0.2 AI (ref 0.0–0.9)
Smith/RNP ABS: <0.2 AI (ref 0.0–0.9)
dsDNA Ab: <1 IU/mL (ref 0–9)

## 2022-06-05 LAB — IFE AND PE, SERUM
A/G Ratio: 0.9 (ref 0.7–1.7)
Albumin: 3.2 g/dL (ref 2.9–4.4)
Alpha 1 Globulin: 0.3 g/dL (ref 0.0–0.4)
Alpha 2 Globulin: 1.1 g/dL — ABNORMAL HIGH (ref 0.4–1.0)
Beta Globulin: 1.2 g/dL (ref 0.7–1.3)
GAMMA GLOBULIN: 1.1 g/dL (ref 0.4–1.8)
Globulin: 3.6 g/dL (ref 2.2–3.9)
IgA: 403 mg/dL (ref 64–422)
IgG, Serum: 1233 mg/dL (ref 586–1602)
IgM: 19 mg/dL — ABNORMAL LOW (ref 26–217)
Total Protein: 6.8 g/dL (ref 6.0–8.5)

## 2022-06-05 LAB — MYELIN ASSOC. GLYCOP: MAG (Myelin Assoc. Glycoprotein) Ab IgM: <900 BTU (ref 0–999)

## 2022-06-05 LAB — GM1 IGG AUTOANTIBODIES: GM 1 IgG: <20 % (ref 0–30)

## 2022-06-05 LAB — GM1 IGM AUTO ANTIBODIES INTERPRETATION

## 2022-06-05 LAB — MAG IGM INTERPRETATION

## 2022-06-06 ENCOUNTER — Encounter: Primary: Family Medicine

## 2022-06-06 ENCOUNTER — Inpatient Hospital Stay: Admit: 2022-06-06 | Payer: MEDICARE | Attending: Surgery | Primary: Family Medicine

## 2022-06-06 DIAGNOSIS — C50412 Malignant neoplasm of upper-outer quadrant of left female breast: Secondary | ICD-10-CM

## 2022-06-15 NOTE — Telephone Encounter (Signed)
Patient only received  10 Pregabalin capsules from the pharmacy, patient takes 200 mg twice daily. Please sent to [facility] on [address]. Please call Ms Mendel Ryder at [phone].

## 2022-06-17 ENCOUNTER — Inpatient Hospital Stay: Admit: 2022-06-17 | Payer: MEDICARE | Attending: Neurology | Primary: Family Medicine

## 2022-06-17 DIAGNOSIS — D352 Benign neoplasm of pituitary gland: Secondary | ICD-10-CM

## 2022-06-17 DIAGNOSIS — M4802 Spinal stenosis, cervical region: Secondary | ICD-10-CM

## 2022-06-17 MED ORDER — GADOTERIDOL 279.3 MG/ML IV SOLN
279.3 MG/ML | Freq: Once | INTRAVENOUS | Status: AC | PRN
Start: 2022-06-17 — End: 2022-06-17
  Administered 2022-06-17: 21:00:00 13 mL via INTRAVENOUS

## 2022-06-17 NOTE — Progress Notes (Signed)
I called the patient, talked to the caregiver let her know the MRI of the brain showed a stable pituitary tumor and they said thank you and also that she has arthritis in her neck and she will continue physical therapy.

## 2022-06-18 NOTE — Telephone Encounter (Signed)
Authorizing Provider: Charleston Poot, APRN - NP refilled medication: pregabalin (LYRICA) 200 MG capsule/ Sig: Take 1 capsule by mouth 2 times daily for 180 days. Max Daily Amount: 400 mg / Refills: 5  Advised  Lindsey  to call [facility] and see why patient was not given full script. Mendel Ryder to return call to office to follow up.

## 2022-06-20 MED ORDER — ROPINIROLE HCL 0.25 MG PO TABS
0.25 MG | ORAL_TABLET | Freq: Every evening | ORAL | 0 refills | Status: DC
Start: 2022-06-20 — End: 2022-09-16

## 2022-06-20 NOTE — Telephone Encounter (Signed)
I show this was d/c by neurology on 05/29/22 citing "error". Patient is out and is requesting a refill.    Last appointment: 05/18/22  Next appointment: 07/20/22  Previous refill encounter(s): 05/18/22    Requested Prescriptions     Pending Prescriptions Disp Refills    rOPINIRole (REQUIP) 0.25 MG tablet 30 tablet 0     Sig: Take 1 tablet by mouth nightly         For Pharmacy Admin Tracking Only    Program: Medication Refill  CPA in place:    Recommendation Provided To:   Intervention Detail: New Rx: 1, reason: Patient Preference  Intervention Accepted By:   Jamse Arn Closed?:    Time Spent (min): 5

## 2022-06-25 LAB — ANTI NEURONAL AB, SERUM: Neuronal Ab: 56 UNITS — ABNORMAL HIGH (ref 0–54)

## 2022-07-02 ENCOUNTER — Emergency Department: Admit: 2022-07-02 | Payer: MEDICARE | Primary: Family Medicine

## 2022-07-02 ENCOUNTER — Inpatient Hospital Stay
Admit: 2022-07-02 | Discharge: 2022-07-02 | Disposition: A | Discharge status: Home / Self Care | Payer: MEDICARE | Attending: Emergency Medicine

## 2022-07-02 DIAGNOSIS — S0990XA Unspecified injury of head, initial encounter: Secondary | ICD-10-CM

## 2022-07-02 DIAGNOSIS — M25511 Pain in right shoulder: Secondary | ICD-10-CM

## 2022-07-02 NOTE — Discharge Instructions (Signed)
Thank you for allowing us to provide you with medical care today.  We realize that you have many choices for your emergency care needs.  We thank you for choosing [facility].  Please choose us in the future for any continued health care needs.     The exam and treatment you received in the Emergency Department were for an emergent problem and are not intended as complete care. It is important that you follow up with a doctor, nurse practitioner, or physician's assistant for ongoing care. If your symptoms worsen or you do not improve as expected and you are unable to reach your usual health care provider, you should return to the Emergency Department. We are available 24 hours a day.     Please make an appointment with your health care provider(s) for follow up of your Emergency Department visit.  Take this sheet with you when you go to your follow-up visit.

## 2022-07-02 NOTE — ED Triage Notes (Signed)
Pt arrives via EMS from home for GLF. Pt reports she hit her head on a doorknob and landed on her right side. No obvious injuries or trauma. Denies LOC. Pt takes an aspirin daily.

## 2022-07-02 NOTE — ED Provider Notes (Signed)
[facility] EMERGENCY [place] ENCOUNTER      [name]  MRN: [id number]  [place] 08-25-1923  Date of Evaluation: 07/02/2022  Physician: Raymondo Band, MD    CHIEF COMPLAINT       Chief Complaint   Patient presents with    Fall       HISTORY OF PRESENT ILLNESS   (Location/Symptom, Timing/Onset, Context/Setting, Quality, Duration, Modifying Factors, Severity)   Kimberly [name], 88 y.o., female     88 year old female presents with a chief complaint of bilateral hip pain, bilateral shoulder pain and headache after a fall.  Patient slipped out of a chair and hit a doorknob with her head.  She denies loss of consciousness.          Nursing Notes were reviewed.    REVIEW OF SYSTEMS    (Not required)   Review of Systems   Musculoskeletal:  Positive for arthralgias.       PAST MEDICAL HISTORY     Past Medical History:   Diagnosis Date    Adverse effect of anesthesia     slow to wake    Anginal pain ([place])     Arthritis     Breast cancer ([place]) 03/10/2018     left lumpectomy    Cancer ([place]) 01/2018    breast    Chronic pain     neuropathy    Constipation     GERD (gastroesophageal reflux disease)     Headache     Hearing loss     High cholesterol     Hypertension     Incontinence     Joint pain     Memory disorder     Muscle pain     Osteoporosis     Ringing in the ears     Snoring     Visual disturbance        SURGICAL HISTORY       Past Surgical History:   Procedure Laterality Date    BREAST LUMPECTOMY Left 03/10/2018    LEFT BREAST LUMPECTOMY WITH ULTRASOUND GUIDANCE performed by Luellen Pucker, MD at [facility] OR    HYSTERECTOMY (CERVIX STATUS UNKNOWN)      ORTHOPEDIC SURGERY      epidural pain mtg - pt unsure    OTHER SURGICAL HISTORY Right 10/15/2016    ligament correction right foot    OTHER SURGICAL HISTORY  11/26/2016    Pituitary tumor removal    US BREAST BIOPSY W LOC DEVICE 1ST LESION LEFT Left 01/21/2018    US BREAST NEEDLE BIOPSY LEFT 01/21/2018 [facility] [place]       CURRENT  MEDICATIONS       Previous Medications    ACETAMINOPHEN (TYLENOL) 500 MG TABLET    Take by mouth every 6 hours as needed    AMMONIUM LACTATE (LAC-HYDRIN) 12 % LOTION    Apply 1 Application topically 2 times daily as needed    ASCORBIC ACID (VITAMIN C) 100 MG TABLET    Take 1 tablet by mouth daily as needed    ASPIRIN 81 MG CHEWABLE TABLET    Take by mouth daily    B COMPLEX VITAMINS CAPSULE    Take 1 capsule by mouth daily    CHOLECALCIFEROL (VITAMIN D3) 50 MCG (2000 UT) CAPS    Take by mouth    DULOXETINE (CYMBALTA) 30 MG EXTENDED RELEASE CAPSULE    Take 1 capsule by mouth daily    PREGABALIN (  LYRICA) 200 MG CAPSULE    Take 1 capsule by mouth 2 times daily for 180 days. Max Daily Amount: 400 mg    ROPINIROLE (REQUIP) 0.25 MG TABLET    Take 1 tablet by mouth nightly       ALLERGIES     Penicillins and Sulfa antibiotics    FAMILY HISTORY       Family History   Problem Relation Age of Onset    Dementia Sister     Dementia Brother     Cancer Daughter     Breast Cancer Daughter 15    Breast Cancer Sister 67    Breast Cancer Mother 56    No Known Problems Father         SOCIAL HISTORY       Social History     Socioeconomic History    Marital status: Widowed   Tobacco Use    Smoking status: Never    Smokeless tobacco: Never   Vaping Use    Vaping Use: Never used   Substance and Sexual Activity    Alcohol use: Not Currently    Drug use: No     Social Determinants of Health     Financial Resource Strain: Low Risk  (05/18/2022)    Overall Financial Resource Strain (CARDIA)     Difficulty of Paying Living Expenses: Not hard at all   Food Insecurity: No Food Insecurity (05/18/2022)    Hunger Vital Sign     Worried About Running Out of Food in the Last Year: Never true     Ran Out of Food in the Last Year: Never true   Transportation Needs: No Transportation Needs (05/18/2022)    PRAPARE - Armed forces logistics/support/administrative officer (Medical): No     Lack of Transportation (Non-Medical): No   Physical Activity: Inactive  (07/14/2021)    Exercise Vital Sign     Days of Exercise per Week: 0 days     Minutes of Exercise per Session: 0 min   Stress: No Stress Concern Present (07/14/2021)    [place]     Feeling of Stress : Not at all   Social Connections: Socially Isolated (07/14/2021)    Social Connection and Isolation Panel [NHANES]     Frequency of Communication with Friends and Family: Once a week     Frequency of Social Gatherings with Friends and Family: Once a week     Attends Religious Services: 1 to 4 times per year     Active Member of [employer] or Organizations: No     Attends Archivist Meetings: Never     Marital Status: Widowed   Intimate Partner Violence: Not At Risk (07/14/2021)    Humiliation, Afraid, Rape, and Kick questionnaire     Fear of Current or Ex-Partner: No     Emotionally Abused: No     Physically Abused: No     Sexually Abused: No   Housing Stability: Low Risk  (05/18/2022)    Housing Stability Vital Sign     Unable to Pay for Housing in the Last Year: No     Number of Places Lived in the Last Year: 1     Unstable Housing in the Last Year: No       PHYSICAL EXAM     Vitals:    Vitals:    07/02/22 1012   BP: 133/72   Pulse: 85   Resp:  14   Temp: 98.5 F (36.9 C)   TempSrc: Oral   SpO2: 92%   Height: 1.549 m ('5\' 1"'$ )       Physical Exam  Vitals and nursing note reviewed.   Constitutional:       General: She is not in acute distress.     Appearance: Normal appearance. She is not ill-appearing, toxic-appearing or diaphoretic.   HENT:      Head: Normocephalic and atraumatic.      Mouth/Throat:      Mouth: Mucous membranes are moist.   Eyes:      Extraocular Movements: Extraocular movements intact.   Cardiovascular:      Rate and Rhythm: Normal rate.   Pulmonary:      Effort: Pulmonary effort is normal.   Abdominal:      General: There is no distension.   Musculoskeletal:         General: Normal range of motion.      Cervical back: Normal range  of motion.   Skin:     General: Skin is dry.   Neurological:      General: No focal deficit present.      Mental Status: She is alert.   Psychiatric:         Mood and Affect: Mood normal.         DIAGNOSTIC RESULTS     RADIOLOGY:   Non-plain film images such as CT, Ultrasound and MRI are read by the radiologist. Plain radiographic images are visualized and preliminarily interpreted by the emergency physician with the below findings:    No fracture of the right, left shoulder or bilateral hips on plain film    Interpretation per the Radiologist below, if available at the time of this note:    [place]    (Results Pending)   CT CERVICAL SPINE WO CONTRAST    (Results Pending)   XR SHOULDER RIGHT (MIN 2 VIEWS)    (Results Pending)   XR SHOULDER LEFT (MIN 2 VIEWS)    (Results Pending)   XR HIP BILATERAL W AP PELVIS (2 VIEWS)    (Results Pending)         ED BEDSIDE ULTRASOUND:   Performed by ED Physician    LABS:  Labs Reviewed - No data to display    All other labs were unremarkable or not returned as of this dictation.    EMERGENCY DEPARTMENT COURSE and DIFFERENTIAL DIAGNOSIS/MDM:   Medical Decision Making  Patient presents with multiple complaints after a fall.  Multiple plain film and CT images obtained to rule out traumatic injury.  Imaging unremarkable.  Discussed my clinical impression(s), any labs and/or radiology results with the patient. I answered any questions and addressed any concerns. Discussed the importance of following up with their primary care physician and/or specialist(s). Discussed signs or symptoms that would warrant return back to the ER for further evaluation. The patient is agreeable with discharge.    Amount and/or Complexity of Data Reviewed  Radiology: ordered.         EKG: All EKG's are interpreted by the Emergency Department Physician who either signs or Co-signs this chart in the absence of a cardiologist.         [place] time was 0 minutes,  excluding separately reportable procedures.  There was a high probability of clinically significant/life threatening deterioration in the patient's condition which required my urgent intervention.     CONSULTS:  None    PROCEDURES:  Unless otherwise noted below, none     Procedures    FINAL IMPRESSION    No diagnosis found.      DISPOSITION/PLAN   DISPOSITION      PATIENT REFERRED TO:  No follow-up provider specified.    DISCHARGE MEDICATIONS:  New Prescriptions    No medications on file     Controlled Substances Monitoring:          No data to display                (Please note that portions of this note were completed with a voice recognition program.  Efforts were made to edit the dictations but occasionally words are mis-transcribed.)    Raymondo Band, MD (electronically signed)  Attending Emergency Physician            Graylin Shiver, MD  07/02/22 2111

## 2022-07-04 ENCOUNTER — Encounter: Payer: MEDICARE | Primary: Family Medicine

## 2022-07-20 ENCOUNTER — Ambulatory Visit: Payer: MEDICARE | Attending: Student in an Organized Health Care Education/Training Program | Primary: Family Medicine

## 2022-07-20 ENCOUNTER — Encounter: Payer: MEDICARE | Primary: Family Medicine

## 2022-07-20 DIAGNOSIS — E1142 Type 2 diabetes mellitus with diabetic polyneuropathy: Secondary | ICD-10-CM

## 2022-07-20 NOTE — Progress Notes (Unsigned)
[facility]    [facility]  [phone]  [place], [place] [address]  [phone]    Office Visit      Assessment and Plan     ***    No follow-ups on file.         Discussed the expected course, resolution and complications of the diagnosis(es) in detail.  Medication risks, benefits, costs, interactions, and alternatives were discussed as indicated.  Patient to contact the office if their condition worsens, changes or fails to improve. Pt verbalized understanding with the diagnosis(es) and plan.           Darlin Priestly, DO    07/19/22   11:46 AM        Subjective     CC: No chief complaint on file.    HPI: Kimberly [name] is a 88 y.o. female presenting to the clinic today for evaluation and/or follow-up of the following concerns:    ***          Review of systems:   A comprehensive review of systems was negative except as written in the HPI.    History  Patient's allergies, medical, surgical, social, and family hx reviewed and available in chart. Updates made where appropriate.       Objective     There were no vitals taken for this visit.  Physical Exam     No results found for this or any previous visit (from the past 24 hour(s)).

## 2022-07-23 NOTE — Telephone Encounter (Signed)
Kimberly [name] [phone]     [name] dob [birth year] hip pain going down her legs needs an appt asap

## 2022-07-23 NOTE — Telephone Encounter (Signed)
[facility]    Received call from Wann at [facility] with [place] Complaint.    Subjective: Caller states "The intensity of the burning has been increasing - she had has neuropathy for years"     Current Symptoms: tingling in the right hand, tingling in both feet, legs burn down the back (from the hip to the knee - may have fallen on this hip in Feb), mildly winded with activity (this is norm)    Onset: ongoing for a while but has worsened in the last 2-3 weeks; worsening    Associated Symptoms: NA    Pain Severity: 6-8/10; burning;mod-severe    Temperature: denies       What has been tried: hot baths, Pregablin, Tylenol 3    LMP: NA Pregnant: No    Recommended disposition: See in Office Today or Tomorrow    Care advice provided, patient verbalizes understanding; denies any other questions or concerns; instructed to call back for any new or worsening symptoms.    Patient/Caller agrees with recommended disposition; Probation officer provided warm transfer to [employer] at [facility] [employer] for appointment schedulingarrie    Attention Provider:  Thank you for allowing me to participate in the care of your patient.  The patient was connected to triage in response to information provided to the ECC/PSC.  Please do not respond through this encounter as the response is not directed to a shared pool.      Reason for Disposition   Patient wants to be seen    Protocols used: Leg Pain-ADULT-[place]

## 2022-07-24 NOTE — Telephone Encounter (Signed)
Returned call to pt.  Spoke wth caregiver  Rollene Fare.  Reqesting appt for follow up, has neurology appt on 07/25/22 for the tingling in legs,  appt scheduled for 09/06/22

## 2022-07-25 ENCOUNTER — Encounter

## 2022-07-25 ENCOUNTER — Encounter: Admit: 2022-07-25 | Discharge: 2022-08-02 | Payer: MEDICARE | Primary: Family Medicine

## 2022-07-25 DIAGNOSIS — D352 Benign neoplasm of pituitary gland: Secondary | ICD-10-CM

## 2022-07-25 MED ORDER — DULOXETINE HCL 60 MG PO CPEP
60 MG | ORAL_CAPSULE | Freq: Every day | ORAL | 3 refills | Status: DC
Start: 2022-07-25 — End: 2023-01-15

## 2022-07-25 NOTE — Progress Notes (Signed)
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  Patient had unremarkable MRI of the brain just recently, and an MRI of the cervical spine just shows mild to moderate degenerative changes but no clear cause for her symptoms either.  Because of her pain for which she is already on Cymbalta 30 mg a day we will increase that to 60 mg a day and continue her pregabalin 200 mg twice a day.

## 2022-07-25 NOTE — Procedures (Signed)
ELECTRODIANOSTIC REPORT  Test Date:  07/25/2022    Patient: Kimberly [name] DOB: 1924/04/25 Physician: Danton Sewer, M.D.   Sex: Female  < Ref Phys:      Technician: Brunilda Payor    Patient History: Patient is a 88 year old female with a history of about 15 years of progressive numbness in her feet and difficulty walking and pain and burning in her hands and feet with presumed neuropathy but never having an EMG study diagnosing her problem.  Patient is not a diabetic but did have some mildly elevated polyclonal gammopathy's but no other clear metabolic cause.  Patient does not have that much back pain or bowel or bladder trouble and most of her symptoms are more pain and numbness and less weakness.  She has no family history of similar problems.  She been followed by multiple neurologist in the past.    Neuro Exam: Patient has hypoactive but symmetric reflexes throughout with absent ankle jerks bilaterally.  Patient has no Babinski or clonus present.  Patient has normal muscle bulk and tone and strength in all extremities except for the intrinsic foot musculature bilaterally which shows some mild weakness and atrophy.  Patient has a slow gait slightly wide-based and unsteady and an abnormal Romberg and tandem.  Patient is normal cranial nerves II through XII and is awake alert cooperative and fluent and has a relatively normal mental status for her age.  She ambulates with contact guarding or cane.    EMG report: This study shows electrophysiologic evidence of a acute and chronic moderately severe distal, length dependent demyelinating and axonal polyneuropathy consistent with various toxic, metabolic or acquired neuropathies or possibly even hereditary neuropathies, or autoimmune neuropathies, and the right arm and both legs as manifest by the chronic axonal changes and denervation potentials seen distally greater than proximally and large polyphasic motor potentials greater in the distal muscles as compared to the  proximal muscles.  There was no clear evidence of a lumbar or cervical radiculopathy on the basis of this study, or entrapment neuropathy or other neuromuscular disease.  Correlation with imaging modalities and metabolic studies may be of further diagnostic value in following this patient, if clinically indicated.  Also repeat studies in 6 to 12 months time may also be of further diagnostic benefit.    EMG & NCV Findings:  Evaluation of the left Fibular motor and the right Fibular motor nerves showed no response (Ankle), no response (B Fib), and no response (Poplt).  The right median motor nerve showed normal distal onset latency (4.4 ms), normal amplitude (4.2 mV), and normal conduction velocity (Elbow-Wrist, 50 m/s).  The left tibial motor and the right tibial motor nerves showed no response (Ankle) and no response (Knee).  The right ulnar motor nerve showed normal distal onset latency (3.1 ms), normal amplitude (Wrist, 7.9 mV), normal amplitude (B Elbow, 6.3 mV), reduced amplitude (A Elbow, 5.6 mV), decreased conduction velocity (Wrist-Abd Dig Minimi, 26 m/s), normal conduction velocity (B Elbow-Wrist, 56 m/s), and normal conduction velocity (A Elbow-B Elbow, 63 m/s).  The right median sensory and the right ulnar sensory nerves showed prolonged distal peak latency (R4.6, R4.5 ms) and normal amplitude (R17.1, R13.6 V).  The right radial sensory nerve showed normal distal peak latency (2.3 ms) and normal amplitude (21.4 V).  The left Sup Fibular sensory and the right Sup Fibular sensory nerves showed no response (Lower leg).  The left sural sensory nerve showed no response (Calf).  __________________  Danton Sewer, MD        Nerve Conduction Studies  Anti Sensory Summary Table     Stim Site NR Onset (ms) Peak (ms) O-P Amp (V) Norm Peak (ms) Norm O-P Amp Site1 Site2 Dist (cm) Norm Vel (m/s)   Right Median Anti Sensory (2nd Digit)  34 C   Wrist    3.3 4.6 17.1 <4 >11 Wrist 2nd Digit 14.0    Right  Radial Anti Sensory (Base 1st Digit)  34 C   Wrist    1.7 2.3 21.4 <2.9 >15 Wrist Base 1st Digit 10.0    Left Sup Fibular Anti Sensory (Lat ankle)  34 C   Lower leg NR    <4.4 >5.0 Lower leg Lat ankle 10.0 >32   Right Sup Fibular Anti Sensory (Lat ankle)  34 C   Lower leg NR    <4.4 >5.0 Lower leg Lat ankle 10.0 >32   Left Sural Anti Sensory (Lat Mall)  34 C   Calf NR    <4.5 >4.0 Calf Lat Mall 14.0    Right Ulnar Anti Sensory (5th Digit)  34 C   Wrist    3.2 4.5 13.6 <4.0 >10 Wrist 5th Digit 14.0      Motor Summary Table     Stim Site NR Onset (ms) Norm Onset (ms) O-P Amp (mV) Norm O-P Amp P-T Amp (mV) Site1 Site2 Dist (cm) Vel (m/s)   Left Fibular Motor (Ext Dig Brev)  34 C   Ankle NR  <6.5  >1.1  Ankle Ext Dig Brev 8.0    B Fib NR      B Fib Ankle 31.5    Poplt NR      Poplt B Fib 10.0    Right Fibular Motor (Ext Dig Brev)  34 C   Ankle NR  <6.5  >1.1  Ankle Ext Dig Brev 8.0    B Fib NR      B Fib Ankle 31.5    Poplt NR      Poplt B Fib 10.0    Right Median Motor (Abd Poll Brev)  34 C   Wrist    4.4 <4.5 4.2 >4.1  Wrist Abd Poll Brev 8.0 18   Elbow    8.4  3.6   Elbow Wrist 20.0 50   Left Tibial Motor (Abd Hall Brev)  34 C   Ankle NR  <6.1  >4.4  Ankle Abd Hall Brev 8.0    Knee NR      Knee Ankle 0.0    Right Tibial Motor (Abd Hall Brev)  34 C   Ankle NR  <6.1  >4.4  Ankle Abd Hall Brev 8.0    Knee NR      Knee Ankle 0.0    Right Ulnar Motor (Abd Dig Minimi)  34 C   Wrist    3.1 <3.1 7.9 >7.0  Wrist Abd Dig Minimi 8.0 26   B Elbow    6.5  6.3 >6.0  B Elbow Wrist 19.0 56   A Elbow    8.1  5.6 >6.0  A Elbow B Elbow 10.0 63     EMG     Side Muscle Nerve Root Ins Act Fibs Psw Recrt Duration Amp Poly Comment   Left MedGastroc Tibial S1-2 Incr 1+ 1+ Reduced Incr Incr 1+    Right MedGastroc Tibial S1-2 Incr 1+ 1+ Reduced Incr Incr 1+    Right Ext Dig Brev Dp Br Peron L5,  S1 Incr 1+ 2+ Reduced Incr Incr 2+    Left Ext Dig Brev Dp Br Peron L5, S1 Incr 1+ 1+ Reduced Incr Incr 3+    Left AbdHallucis MedPlantar  S1-2 Incr 2+ 2+ Reduced Incr Incr 2+    Right Abd Poll Brev Median C8-T1 Nml Nml Nml Nml Incr Incr 1+    Right Biceps Musculocut C5-6 Nml Nml Nml Nml Nml Nml Nml    Right Lower Cerv Parasp Rami C7,T1 Nml Nml Nml Nml Nml Nml Nml    Right VastusLat Femoral L2-4 Nml Nml Nml Nml Incr Incr 1+    Right AbdHallucis MedPlantar S1-2 Nml Nml Nml Reduced Incr Incr 2+    Right 1stDorInt Ulnar C8-T1 Nml Nml Nml Nml Incr Incr 1+    Right Deltoid Axillary C5-6 Nml Nml Nml Nml Nml Nml Nml    Right Triceps Radial C6-7-8 Nml Nml Nml Nml Nml Nml Nml    Left VastusLat Femoral L2-4 Nml Nml Nml Nml Incr Nml 1+    Right AntTibialis Dp Br Peron L4-5 Nml Nml 1+ Reduced Incr Incr 1+    Left AntTibialis Dp Br Peron L4-5 Nml Nml Nml Reduced Incr Incr 1+          Waveforms:

## 2022-07-26 LAB — SEDIMENTATION RATE: Sed Rate, Automated: 53 mm/hr — ABNORMAL HIGH (ref 0–30)

## 2022-07-27 LAB — ANA COMPREHENSIVE PLUS PANEL
Anti-RNP: <0.2 AI (ref 0.0–0.9)
CENTROMERE PROTEIN B ANTIBODY: <0.2 AI (ref 0.0–0.9)
Chromatin Antibody: <0.2 AI (ref 0.0–0.9)
ENA JO-1 Ab: <0.2 AI (ref 0.0–0.9)
ENA SCL-70 Ab: <0.2 AI (ref 0.0–0.9)
ENA SSA (RO) Ab: <0.2 AI (ref 0.0–0.9)
ENA SSB (LA) Ab: <0.2 AI (ref 0.0–0.9)
ENA Smith (SM) Ab: <0.2 AI (ref 0.0–0.9)
RIBOSOMAL P ANTIBODY, 9934283: <0.2 AI (ref 0.0–0.9)
Smith/RNP ABS: <0.2 AI (ref 0.0–0.9)
dsDNA Ab: <1 IU/mL (ref 0–9)

## 2022-07-27 LAB — GLIADIN ANTIBODIES, SERUM
Gliadin Antibodies IgA: 10 units (ref 0–19)
Gliadin Antibodies IgG: 2 units (ref 0–19)

## 2022-07-31 LAB — IFE AND PE, SERUM
A/G Ratio: 0.8 (ref 0.7–1.7)
Albumin: 2.9 g/dL (ref 2.9–4.4)
Alpha 1 Globulin: 0.3 g/dL (ref 0.0–0.4)
Alpha 2 Globulin: 1.2 g/dL — ABNORMAL HIGH (ref 0.4–1.0)
Beta Globulin: 1.2 g/dL (ref 0.7–1.3)
GAMMA GLOBULIN: 1.1 g/dL (ref 0.4–1.8)
Globulin: 3.8 g/dL (ref 2.2–3.9)
IgA: 414 mg/dL (ref 64–422)
IgG, Serum: 1198 mg/dL (ref 586–1602)
IgM: 18 mg/dL — ABNORMAL LOW (ref 26–217)
Total Protein: 6.7 g/dL (ref 6.0–8.5)

## 2022-07-31 LAB — GM1 IGM AUTO ANTIBODIES INTERPRETATION

## 2022-07-31 LAB — METHYLMALONIC ACID, SERUM: Methylmalonic Acid: 200 nmol/L (ref 0–378)

## 2022-07-31 LAB — GM1 IGG AUTOANTIBODIES: GM 1 IgG: <20 % (ref 0–30)

## 2022-08-09 LAB — ANTI NEURONAL AB, SERUM
Interpretation: NORMAL
Neuronal Ab: 53 UNITS (ref 0–54)

## 2022-08-20 ENCOUNTER — Ambulatory Visit: Payer: MEDICARE | Primary: Family Medicine

## 2022-08-21 ENCOUNTER — Encounter: Admit: 2022-08-21 | Payer: MEDICARE | Attending: Neurology | Primary: Family Medicine

## 2022-08-21 DIAGNOSIS — I6523 Occlusion and stenosis of bilateral carotid arteries: Secondary | ICD-10-CM

## 2022-08-21 MED ORDER — DONEPEZIL HCL 5 MG PO TABS
5 MG | ORAL_TABLET | Freq: Every day | ORAL | 11 refills | Status: DC
Start: 2022-08-21 — End: 2023-09-19

## 2022-08-21 NOTE — Progress Notes (Signed)
Consult  REFERRED BY:  Cathlyn Parsons, MD    CHIEF COMPLAINT: Progressive burning pain in her hands and feet, which required her to go to the emergency room for treatment and get an injection for pain and she was referred to [place] on work in basis on urgent emergency evaluation.      Subjective:     Kimberly [name] is a 89 y.o. right-handed African-American female we are seeing at the request of Dr. Doyce Para, on urgent work in basis for neuropathy in her hands and feet that is no better on the Lyrica 200 mg 3 times a day which she is tolerating well without side effect, and new problem of memory loss is getting worse also.  After being seen in the ER last night for severe burning pain in her hands and feet that she has had for at least 8 or 9 years, and seems to be getting worse and worse.  Her EMG did document neuropathy, and her workup for metabolic studies was negative for any treatable causes to see.  Her MRI of the cervical spine just showed moderate severe arthritis, but no real surgical disease at her age.  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  She is encouraged take her multivitamins and vitamin D every day remain mentally and physically active.  She is going to get an MRI of the lumbar spine done in the near future to further evaluate this neuropathy.  Her EMG study did document the neuropathy that was moderate to severe.  She been followed by several neurologist in the past, but they have left the practice.  She was diagnosed with neuropathy, but no real EMG test or metabolic screens were ever done as best I can tell, the cause of her neuropathy is completely unclear.  She is taking Lyrica 200 mg twice a day with some improvement, but seems to be breaking through.  She has bilateral foot drop of unclear etiology, and I guess this may be related to the neuropathy.  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  She has a past history of breast cancer, previous lumpectomy, chronic neuropathic pain, hyperlipidemia, hypertension, but apparently is not a diabetic.  She has a pituitary tumor also.  She has had back problems and received pain management and epidural steroids from previous pain management doctors.  She takes Lidoderm patches as recommended by Dr. Meriam Sprague.  She has tried topical pain relievers in addition also of multiple types.  She has had syncope in the past and memory loss, and probably has a mild dementia at her age.  She has significant arthritis in her neck and back.  She has a very complicated history and all of her past history was reviewed in detail and previous MRI scans, and her neurology notes and medical notes, but no neurosurgery notes are noted, and she thinks that may be because she thinks she might have been operated on at [facility], she really cannot remember due to her memory problems.  Despite the enlarging pituitary macroadenoma, she does not complain  of headaches or new visual symptoms now, but her vision does seem to be impaired  Her memory seems more recent memory, and her family had some memory problems in the several members, they got better with Aricept and the daughter would like that, we will give her some Aricept for memory loss and see how she does and that may make her more active, she does tend to sleep a bit.  On her Mini-Mental status examination she does appear to have a mild dementia.    Past Medical History:   Diagnosis Date    Adverse effect of anesthesia     slow to wake    Anginal pain ([place])     Arthritis     Breast cancer ([place]) 03/10/2018     left lumpectomy    Cancer ([place]) 01/2018     breast    Chronic pain     neuropathy    Constipation     GERD (gastroesophageal reflux disease)     Headache     Hearing loss     High cholesterol     Hypertension     Incontinence     Joint pain     Memory disorder     Muscle pain     Osteoporosis     Ringing in the ears     Snoring     Visual disturbance       Past Surgical History:   Procedure Laterality Date    BREAST LUMPECTOMY Left 03/10/2018    LEFT BREAST LUMPECTOMY WITH ULTRASOUND GUIDANCE performed by Luellen Pucker, MD at [facility]    HYSTERECTOMY (CERVIX STATUS UNKNOWN)      ORTHOPEDIC SURGERY      epidural pain mtg - pt unsure    OTHER SURGICAL HISTORY Right 10/15/2016    ligament correction right foot    OTHER SURGICAL HISTORY  11/26/2016    Pituitary tumor removal    US BREAST BIOPSY W LOC DEVICE 1ST LESION LEFT Left 01/21/2018    US BREAST NEEDLE BIOPSY LEFT 01/21/2018 [facility] [place]     Family History   Problem Relation Age of Onset    Dementia Sister     Dementia Brother     Cancer Daughter     Breast Cancer Daughter 62    Breast Cancer Sister 43    Breast Cancer Mother 63    No Known Problems Father       Social History     Tobacco Use    Smoking status: Never    Smokeless tobacco: Never   Substance Use Topics    Alcohol use: Not Currently         Current Outpatient Medications:     acetaminophen-codeine (TYLENOL #3) 300-30 MG per tablet, , Disp: , Rfl:     donepezil (ARICEPT) 5 MG tablet, Take 1 tablet by mouth daily (before dinner), Disp: 30 tablet, Rfl: 11    DULoxetine (CYMBALTA) 60 MG extended release capsule, Take 1 capsule by mouth daily (before dinner), Disp: 90 capsule, Rfl: 3    rOPINIRole (REQUIP) 0.25 MG tablet, Take 1 tablet by mouth nightly, Disp: 90 tablet, Rfl: 0    b complex vitamins capsule, Take 1 capsule by mouth daily, Disp: , Rfl:     Cholecalciferol (VITAMIN D3) 50 MCG (2000 UT) CAPS, Take by mouth, Disp: , Rfl:     ammonium lactate (LAC-HYDRIN) 12 % lotion, Apply 1 Application topically 2 times daily as needed,  Disp: ,  Rfl:     ascorbic acid (VITAMIN C) 100 MG tablet, Take 1 tablet by mouth daily as needed, Disp: , Rfl:     pregabalin (LYRICA) 200 MG capsule, Take 1 capsule by mouth 2 times daily for 180 days. Max Daily Amount: 400 mg, Disp: 60 capsule, Rfl: 5    acetaminophen (TYLENOL) 500 MG tablet, Take by mouth every 6 hours as needed, Disp: , Rfl:     aspirin 81 MG chewable tablet, Take by mouth daily, Disp: , Rfl:         Allergies   Allergen Reactions    Penicillins Hives and Myalgia    Sulfa Antibiotics      Other reaction(s): Unknown (comments)      MRI Results (most recent):  '@BSHSILASTIMGCAT'$ (ZOX0960:4)@    '@BSHSILASTIMGCAT'$ (VWU9811:9)@  Review of Systems:  A comprehensive review of systems was negative except for: Constitutional: positive for fatigue and malaise  Musculoskeletal: positive for arthralgias, back pain, muscle weakness, myalgias, neck pain, and stiff joints  Neurological: positive for coordination problems, gait problems, memory problems, paresthesia, and weakness  Behvioral/Psych: positive for anxiety, depression, and dementia    Vitals:    08/21/22 1057   BP: 124/68   Site: Left Upper Arm   Position: Sitting   Cuff Size: Medium Adult   Pulse: 99   Resp: 16   Temp: 97.7 F (36.5 C)   TempSrc: Temporal   SpO2: 97%   Weight: 65.3 kg (144 lb)   Height: 1.6 m ('5\' 3"'$ )     Objective:     I      NEUROLOGICAL EXAM:    Appearance:  The patient is poorly developed, well nourished, provides a poor history and is in no acute distress.   Mental Status: Oriented to place and person, and the president, but not the date, and she is a somewhat poor historian, and cognitive function is mildly abnormal and speech is fluent and no aphasia or dysarthria. Mood and affect appropriate.   Cranial Nerves:   Intact visual fields. Fundi are poorly seen. PERLA, EOM's full, no nystagmus, no ptosis. Facial sensation is normal. Corneal reflexes are not tested. Facial movement is asymmetric. Hearing is normal bilaterally. Palate  is midline with normal sternocleidomastoid and trapezius muscles are normal. Tongue is midline.  Neck without meningismus or bruits  Temporal arteries are not tender or enlarged  TMJ areas are not tender on palpation   Motor:  3-4/5 strength in upper and lower proximal and distal muscles except that she has severe foot drop and distal weakness in both legs and has to wear bilateral AFOs, possibly due to neuropathy or lumbar radiculopathy.  Reduced bulk and tone. No fasciculations.  Rapid alternating movement is symmetric and slow bilaterally   Reflexes:   Deep tendon reflexes 2+/4 and symmetrical.  No babinski or clonus present   Sensory:   Abnormal to touch, pinprick and vibration and temperature to about knee level.  DSS is intact   Gait:  Abnormal gait for patient's age as she has to use a walker and bilateral AFO braces.   Tremor:   No tremor noted.   Cerebellar:  Moderately abnormal cerebellar signs present on Romberg and tandem testing and finger-nose-finger exam.   Neurovascular:  Normal heart sounds and regular rhythm, peripheral pulses decreased, and no carotid bruits.           Assessment:      Diagnosis Orders   1. Bilateral carotid artery stenosis  2. Cerebral microvascular disease        3. 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 Lumbosacral radiculopathy due to degenerative joint disease of spine        6. Idiopathic small fiber peripheral neuropathy        7. Cervical radiculopathy due to degenerative joint disease of spine        8. Disturbance of memory  donepezil (ARICEPT) 5 MG tablet      9. 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 *  Resolved Problems:    * No resolved hospital problems. *      Plan:     Patient with complicated problem of severe burning pain in her hands and feet, my  concern is this may be partly related to her cervical disease which was described as moderate to severe, on MRI of the brain, and on her EMG she did have moderate neuropathy, and her panels did not show any treatable cause, to idiopathic, although can do was treated at her age, we will continue to do that and see how she does, she seems stable on the Lyrica 200 mg twice a day which directly could go to 3 times a day or even add Cymbalta in the future if needed.   MRI of the cervical spine did show moderate disease but no surgical disease to get her age, so we can do is possible that and try to keep her comfortable.  MRI of the lumbar spine is pending at this time.  MRI of the brain did show the macroadenoma of the pituitary gland stable, not enlarging or enhancing, we will continue to follow that closely.    We discussed this in detail with the patient and her daughter, and we will try her on Cymbalta to try to help control the pain until we get an answer for her problems, and try to hold off on narcotics at this time, and may have to advance the Lyrica to 200 mg 3 times a day but at her age I am a little leery of that.  She is encouraged to take a multivitamin and vitamin D every day, stay physically mentally active, try to exercise some every day, and try some physical therapy but the patient says no she does not want the therapy she says she can just do her own exercises at home.  Memory is getting worse, we will give her Aricept, I do not think she needs neuropsych testing at her age, and other family members have responded to Aricept and the daughter really would like to try that.  35 minutes spent reviewing her records in detail on the chart, going over all her MRI scans, reviewing all of her previous neurology notes and work-ups, and trying to decipher what kind of neuropathy she does have and what test had been done and what needs to be done.  We encouraged him to try to eat a Mediterranean type diet in  addition to take the vitamins as above.  We will see her again in 3 months time we will check MyChart for results of her test, or call us for results, and this is a very difficult case with multiple problems, which could be serious and  life-threatening if she has a pituitary tumor or has falls or progressive weakness and may be even possibly neuromuscular disease which could be fatal we will get an EMG study to evaluate all this.      Signed By: Danton Sewer, MD     August 21, 2022       CC: Cathlyn Parsons, MD  FAX: [phone]

## 2022-08-29 ENCOUNTER — Ambulatory Visit: Payer: MEDICARE | Primary: Family Medicine

## 2022-09-06 ENCOUNTER — Ambulatory Visit: Admit: 2022-09-06 | Discharge: 2022-09-06 | Payer: MEDICARE | Attending: Family Medicine | Primary: Family Medicine

## 2022-09-06 DIAGNOSIS — I129 Hypertensive chronic kidney disease with stage 1 through stage 4 chronic kidney disease, or unspecified chronic kidney disease: Secondary | ICD-10-CM

## 2022-09-06 DIAGNOSIS — I10 Essential (primary) hypertension: Secondary | ICD-10-CM

## 2022-09-06 MED ORDER — VALSARTAN 40 MG PO TABS
40 MG | ORAL_TABLET | Freq: Every day | ORAL | 0 refills | Status: AC
Start: 2022-09-06 — End: 2022-10-10

## 2022-09-06 NOTE — Progress Notes (Signed)
Chief Complaint   Patient presents with    Follow-up     Dr. Ronnald Ramp 05/18/2022 Hypertension     Accompanied By: Niece & Care Giver    Ambulance called on 08/26/2022 Sunday Difficult awakening, elevated BP 200/111 taken by paramedics and niece 190/107. Patient declined to go to ED.    Patient fell on the 08/27/2022 per niece but no injury.    "Have you been to the ER, urgent care clinic since your last visit?  Hospitalized since your last visit?"      Yes  07/02/2022  [facility]  Acute pain of both shoulders     "Have you seen or consulted any other health care providers outside of [facility] since your last visit?"    NO             Vitals:    09/06/22 1127   BP: 138/79   Pulse: 78   Resp: 16   Temp: 98.2 F (36.8 C)   SpO2: 93%     Health Maintenance Due   Topic Date Due    Shingles vaccine (1 of 2) Never done    Respiratory Syncytial Virus (RSV) Pregnant or age 23 yrs+ (64 - 1-dose 60+ series) Never done    Pneumococcal 65+ years Vaccine (2 - PCV) 08/19/2015    Annual Wellness Visit (Medicare)  08/19/2022      The patient, Kimberly [name], identity was verified by name and DOB, pharmacy verified  Labs:Yes  Fasting:No

## 2022-09-06 NOTE — Progress Notes (Signed)
[facility]  [phone] [place] [address]  [phone]    Chief Complaint: chronic medical problems    Subjective  Kimberly [name] is a 89 y.o. Black / [place] American female , established patient, here for evaluation of the concern(s) above;    PMHx: HTN, PVD, pituitary mirroadenoma S/P SURGERY, DM with peripheral neuropathy, DDD , CKD 3a, Malignant neoplasm of left breast, Memory disturbance, HLD who presents for follow up of chronic problems;    Accompanied By: Niece & Care Giver     Per caregiver, pt had an episode about 10 days ago (08/26/2022) in which patient was deep asleep and could not be woken. Caregiver called EMS and they were able to wake patient up. She was alert and oriented upon waking up. They offered to take her to the ER but she declined.  Patient's BP was as high as  200/111 taken by paramedics and niece 190/107.   Niece has been monitoring her BP at home and SBP stayed above 150 but less than 160.    Pt also fell in the house about 9 days ago without any injury. They did not want to go the ER either.    No headache. No chest pain on exertion, no dyspnea on exertion, no swelling of ankles.    Neuropathy:  On Lyrica '200mg'$  BID for neuropathy managed by neurology.     Uses a Rolator walker when needed     Pt denies any  fever, chill, chest pain, SOB, abdominal pain, n/v/d, HA or dizziness.      Other Health Habits and social history:  Her only daughter passed away.     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       Allergies - reviewed:   Allergies   Allergen Reactions    Penicillins Hives and Myalgia    Sulfa Antibiotics      Other reaction(s): Unknown (comments)       Past Medical History - reviewed:  Past Medical History:   Diagnosis Date    Adverse effect of anesthesia     slow to wake    Anginal pain ([place])     Arthritis     Breast  cancer ([place]) 03/10/2018     left lumpectomy    Cancer ([place]) 01/2018    breast    Chronic pain     neuropathy    Constipation     GERD (gastroesophageal reflux disease)     Headache     Hearing loss     High cholesterol     Hypertension     Incontinence     Joint pain     Memory disorder     Muscle pain     Osteoporosis     Ringing in the ears     Snoring     Visual disturbance        Depression screening:  No data recorded    Review of systems:   A comprehensive review of systems was negative except for that written in the History of Present Illness.     Physical Exam  BP 138/79 (Site: Left Upper Arm, Position: Sitting, Cuff Size: Medium Adult)   Pulse 78   Temp 98.2 F (36.8 C) (Tympanic)   Resp 16   Ht 1.6 m ('5\' 3"'$ )   Wt 66 kg (  145 lb 8.1 oz)   SpO2 93%   BMI 25.77 kg/m     General: Alert and oriented, in no acute distress.  SKIN: No rash. No suspicious lesions or moles.  EYE: PERRL. Sclera and conjuctival clear. Extraocular movements intact.  EARS: External normal, canals clear, tympanic membranes normal.   NOSE: Mucosa healthy without drainage or ulceration.  OROPHARYNX: No suspicious lesions, normal dentition, pharynx, tongue and tonsils normal.  NECK: Supple; no masses; thyroid normal.  LUNGS: Respirations unlabored; clear to auscultation bilaterally.  CARDIOVASCULAR: Regular, rate, and rhythm without murmurs, gallops or rubs.  ABDOMEN: Soft; nontender; nondistended; normoactive bowel sounds; no masses or organomegaly.  MUSCULOSKELETAL: FROM in all extremities     EXT: No edema. Neurovascularlly intact.   Neurological: Alert and oriented X 3, normal strength and tone. Normal symmetric reflexes. Normal coordination     Assessment/Plan   Diagnosis Orders   1. Primary hypertension  Comprehensive Metabolic Panel    valsartan (DIOVAN) 40 MG tablet    Comprehensive Metabolic Panel      2. At high risk for falls        3. Diabetic peripheral neuropathy associated with type 2 diabetes mellitus (HCC)   Comprehensive Metabolic Panel    Comprehensive Metabolic Panel      4. Peripheral vascular disease, unspecified ([place])        5. History of surgical removal of pituitary gland ([place])        6. Chronic kidney disease, stage 3a (HCC)  Comprehensive Metabolic Panel    CBC    CBC    Comprehensive Metabolic Panel        1. Primary hypertension  Will start low dose of valsartan given elevated Bps at home  - Comprehensive Metabolic Panel;  - valsartan (DIOVAN) 40 MG tablet; Take 1 tablet by mouth daily  Dispense: 90 tablet; Refill: 0  - Comprehensive Metabolic Panel    2. At high risk for falls  On the basis of positive falls risk screening, assessment and plan is as follows: home safety tips provided, patient declines any further evaluation/treatment for increased falls risk.      3. Diabetic peripheral neuropathy associated with type 2 diabetes mellitus ([place])  Well controlled without meds  - Comprehensive Metabolic Panel; Future  - Comprehensive Metabolic Panel    4. Peripheral vascular disease, unspecified ([place])  Stable.  Continue  daily ASA    5. History of surgical removal of pituitary gland (HCC)  Stable.  Asymptomatic  Follow up with neurology    6. Chronic kidney disease, stage 3a ([place])  - Comprehensive Metabolic Panel; Future  - CBC; Future      Follow up: 3 months. RTC to clinic sooner should symptoms persist, worsen or fail to improve as anticipated.    We discussed the expected course, resolution and complications of the diagnosis(es) in detail.  Medication risks, benefits, costs, interactions, and alternatives were discussed as indicated.  I advised to contact the office if his condition worsens, changes or fails to improve as anticipated. Pt expressed understanding with the diagnosis(es) and plan. Patient understands that this encounter was a temporary measure, and the importance of further follow up and examination was emphasized.  Patient verbalized understanding.      Signed By: Clearence Ped, MD      September 06, 2022

## 2022-09-07 LAB — COMPREHENSIVE METABOLIC PANEL
ALT: 22 U/L (ref 12–78)
AST: 16 U/L (ref 15–37)
Albumin/Globulin Ratio: 0.8 — ABNORMAL LOW (ref 1.1–2.2)
Albumin: 3.1 g/dL — ABNORMAL LOW (ref 3.5–5.0)
Alk Phosphatase: 86 U/L (ref 45–117)
Anion Gap: 7 mmol/L (ref 5–15)
BUN/Creatinine Ratio: 31 — ABNORMAL HIGH (ref 12–20)
BUN: 22 MG/DL — ABNORMAL HIGH (ref 6–20)
CO2: 27 mmol/L (ref 21–32)
Calcium: 9.4 MG/DL (ref 8.5–10.1)
Chloride: 108 mmol/L (ref 97–108)
Creatinine: 0.71 MG/DL (ref 0.55–1.02)
Est, Glom Filt Rate: >60 mL/min/{1.73_m2} (ref >60)
Globulin: 3.8 g/dL (ref 2.0–4.0)
Glucose: 94 mg/dL (ref 65–100)
Potassium: 4.2 mmol/L (ref 3.5–5.1)
Sodium: 142 mmol/L (ref 136–145)
Total Bilirubin: 0.3 MG/DL (ref 0.2–1.0)
Total Protein: 6.9 g/dL (ref 6.4–8.2)

## 2022-09-07 LAB — CBC
Hematocrit: 37.4 % (ref 35.0–47.0)
Hemoglobin: 11.2 g/dL — ABNORMAL LOW (ref 11.5–16.0)
MCH: 21.7 PG — ABNORMAL LOW (ref 26.0–34.0)
MCHC: 29.9 g/dL — ABNORMAL LOW (ref 30.0–36.5)
MCV: 72.6 FL — ABNORMAL LOW (ref 80.0–99.0)
MPV: 11.8 FL (ref 8.9–12.9)
Nucleated RBCs: 0 PER 100 WBC
Platelets: 281 10*3/uL (ref 150–400)
RBC: 5.15 M/uL (ref 3.80–5.20)
RDW: 16.4 % — ABNORMAL HIGH (ref 11.5–14.5)
WBC: 6.5 10*3/uL (ref 3.6–11.0)
nRBC: 0 10*3/uL (ref 0.00–0.01)

## 2022-09-12 ENCOUNTER — Encounter

## 2022-09-12 ENCOUNTER — Emergency Department: Admit: 2022-09-12 | Payer: MEDICARE | Primary: Family Medicine

## 2022-09-12 ENCOUNTER — Inpatient Hospital Stay
Admission: EM | Admit: 2022-09-12 | Discharge: 2022-09-14 | Disposition: A | Discharge status: Home Health | Payer: Medicare Other | Admitting: Internal Medicine

## 2022-09-12 DIAGNOSIS — F0390 Unspecified dementia without behavioral disturbance: Secondary | ICD-10-CM

## 2022-09-12 DIAGNOSIS — R200 Anesthesia of skin: Secondary | ICD-10-CM

## 2022-09-12 LAB — CBC WITH AUTO DIFFERENTIAL
Absolute Immature Granulocyte: 0.1 10*3/uL — ABNORMAL HIGH (ref 0.00–0.04)
Basophils %: 1 % (ref 0–1)
Basophils Absolute: 0.1 10*3/uL (ref 0.0–0.1)
Eosinophils %: 3 % (ref 0–7)
Eosinophils Absolute: 0.2 10*3/uL (ref 0.0–0.4)
Hematocrit: 35.6 % (ref 35.0–47.0)
Hemoglobin: 11.3 g/dL — ABNORMAL LOW (ref 11.5–16.0)
Immature Granulocytes: 1 % — ABNORMAL HIGH (ref 0.0–0.5)
Lymphocytes %: 10 % — ABNORMAL LOW (ref 12–49)
Lymphocytes Absolute: 0.6 10*3/uL — ABNORMAL LOW (ref 0.8–3.5)
MCH: 22.2 PG — ABNORMAL LOW (ref 26.0–34.0)
MCHC: 31.7 g/dL (ref 30.0–36.5)
MCV: 69.9 FL — ABNORMAL LOW (ref 80.0–99.0)
MPV: 10.6 FL (ref 8.9–12.9)
Monocytes %: 10 % (ref 5–13)
Monocytes Absolute: 0.6 10*3/uL (ref 0.0–1.0)
Neutrophils %: 75 % (ref 32–75)
Neutrophils Absolute: 4.1 10*3/uL (ref 1.8–8.0)
Nucleated RBCs: 0 PER 100 WBC
Platelets: 260 10*3/uL (ref 150–400)
RBC: 5.09 M/uL (ref 3.80–5.20)
RDW: 16.8 % — ABNORMAL HIGH (ref 11.5–14.5)
WBC: 5.7 10*3/uL (ref 3.6–11.0)
nRBC: 0 10*3/uL (ref 0.00–0.01)

## 2022-09-12 LAB — COMPREHENSIVE METABOLIC PANEL
ALT: 19 U/L (ref 12–78)
AST: 14 U/L — ABNORMAL LOW (ref 15–37)
Albumin/Globulin Ratio: 0.6 — ABNORMAL LOW (ref 1.1–2.2)
Albumin: 2.8 g/dL — ABNORMAL LOW (ref 3.5–5.0)
Alk Phosphatase: 89 U/L (ref 45–117)
Anion Gap: 3 mmol/L — ABNORMAL LOW (ref 5–15)
BUN: 15 MG/DL (ref 6–20)
Bun/Cre Ratio: 17 (ref 12–20)
CO2: 27 mmol/L (ref 21–32)
Calcium: 9.4 MG/DL (ref 8.5–10.1)
Chloride: 112 mmol/L — ABNORMAL HIGH (ref 97–108)
Creatinine: 0.88 MG/DL (ref 0.55–1.02)
Est, Glom Filt Rate: >60 mL/min/{1.73_m2} (ref >60)
Globulin: 4.4 g/dL — ABNORMAL HIGH (ref 2.0–4.0)
Glucose: 103 mg/dL — ABNORMAL HIGH (ref 65–100)
Potassium: 4.4 mmol/L (ref 3.5–5.1)
Sodium: 142 mmol/L (ref 136–145)
Total Bilirubin: 0.4 MG/DL (ref 0.2–1.0)
Total Protein: 7.2 g/dL (ref 6.4–8.2)

## 2022-09-12 LAB — PROTIME-INR
INR: 1 (ref 0.9–1.1)
Protime: 10.9 s (ref 9.0–11.1)

## 2022-09-12 LAB — EXTRA TUBES HOLD

## 2022-09-12 LAB — TROPONIN: Troponin, High Sensitivity: 10 ng/L (ref 0–51)

## 2022-09-12 MED ORDER — IOPAMIDOL 76 % IV SOLN
76 | Freq: Once | INTRAVENOUS | Status: AC | PRN
Start: 2022-09-12 — End: 2022-09-12
  Administered 2022-09-12: 21:00:00 100 mL via INTRAVENOUS

## 2022-09-12 MED FILL — ISOVUE-370 76 % IV SOLN: 76 % | INTRAVENOUS | Qty: 100

## 2022-09-12 NOTE — ED Notes (Addendum)
2147: Called report to the floor, unable to send patient to the floor at this time until there are orders. Nurse accepting patient aware. MD paged regarding night time medications, family is concerned that the patient has not had her night time medications. MD paged asking if the patient would be able to take her own night time medications. Told to hold off at this time.   2320: Family out to nurses station very upset stating, "this is ridiculous, if this is how you treat your patients then I will just take my mother home, she is 89 years old, she's in pain, you don't care, this is ridiculous". This Rn explained that without medication orders I am unable to give any medications at this time, patient's family continues to yell at this nurse. De-escalation not improving families demeanor, this RN walked away to find charge to speak to them about the patient's predicament and why she is still in the ED without med's at this time. Family is giving the patient her own home medications.     Charge aware, nursing supervisors aware.

## 2022-09-12 NOTE — ED Provider Notes (Signed)
[facility] EMERGENCY DEP  EMERGENCY DEPARTMENT ENCOUNTER      Pt Name: Kimberly [name]  MRN: [id number]  [place] [birth year]  Date of evaluation: 09/12/2022  Provider: Quinn Axe, MD    [place]       Chief Complaint   Patient presents with    Numbness         HISTORY OF PRESENT ILLNESS   (Location/Symptom, Timing/Onset, Context/Setting, Quality, Duration, Modifying Factors, Severity)  Note limiting factors.   Kimberly [name] is a 89 y.o. female who presents to the emergency department      The history is provided by the patient and a relative. No language interpreter was used.       Nursing Notes were reviewed.    REVIEW OF SYSTEMS    (2-9 systems for level 4, 10 or more for level 5)     Review of Systems   Constitutional:  Negative for activity change, chills and fever.   HENT:  Negative for nosebleeds.    Eyes:  Negative for visual disturbance.   Respiratory:  Negative for shortness of breath.    Cardiovascular:  Negative for chest pain and palpitations.   Gastrointestinal:  Negative for abdominal pain, constipation, diarrhea, nausea and vomiting.   Genitourinary:  Negative for difficulty urinating, dysuria, hematuria and urgency.   Musculoskeletal:  Negative for back pain, neck pain and neck stiffness.   Skin:  Negative for color change.   Allergic/Immunologic: Negative for immunocompromised state.   Neurological:  Positive for numbness. Negative for dizziness, seizures, syncope, weakness, light-headedness and headaches.   Psychiatric/Behavioral:  Negative for behavioral problems, confusion, hallucinations, self-injury and suicidal ideas.        Except as noted above the remainder of the review of systems was reviewed and negative.       PAST MEDICAL HISTORY     Past Medical History:   Diagnosis Date    Adverse effect of anesthesia     slow to wake    Anginal pain ([place])     Arthritis     Breast cancer ([place]) 03/10/2018     left lumpectomy    Cancer ([place]) 01/2018    breast    Chronic pain     neuropathy     Constipation     GERD (gastroesophageal reflux disease)     Headache     Hearing loss     High cholesterol     Hypertension     Incontinence     Joint pain     Memory disorder     Muscle pain     Osteoporosis     Ringing in the ears     Snoring     Visual disturbance          SURGICAL HISTORY       Past Surgical History:   Procedure Laterality Date    BREAST LUMPECTOMY Left 03/10/2018    LEFT BREAST LUMPECTOMY WITH ULTRASOUND GUIDANCE performed by Luellen Pucker, MD at [place] (CERVIX STATUS UNKNOWN)      ORTHOPEDIC SURGERY      epidural pain mtg - pt unsure    OTHER SURGICAL HISTORY Right 10/15/2016    ligament correction right foot    OTHER SURGICAL HISTORY  11/26/2016    Pituitary tumor removal    US BREAST BIOPSY W LOC DEVICE 1ST LESION LEFT Left 01/21/2018    US BREAST NEEDLE BIOPSY LEFT 01/21/2018 [facility] [place]  CURRENT MEDICATIONS       Previous Medications    ACETAMINOPHEN (TYLENOL) 500 MG TABLET    Take by mouth every 6 hours as needed    ACETAMINOPHEN-CODEINE (TYLENOL #3) 300-30 MG PER TABLET        AMMONIUM LACTATE (LAC-HYDRIN) 12 % LOTION    Apply 1 Application topically 2 times daily as needed    ASCORBIC ACID (VITAMIN C) 100 MG TABLET    Take 1 tablet by mouth daily as needed    ASPIRIN 81 MG CHEWABLE TABLET    Take by mouth daily    B COMPLEX VITAMINS CAPSULE    Take 1 capsule by mouth daily    CHOLECALCIFEROL (VITAMIN D3) 50 MCG (2000 UT) CAPS    Take by mouth    DONEPEZIL (ARICEPT) 5 MG TABLET    Take 1 tablet by mouth daily (before dinner)    DULOXETINE (CYMBALTA) 60 MG EXTENDED RELEASE CAPSULE    Take 1 capsule by mouth daily (before dinner)    PREGABALIN (LYRICA) 200 MG CAPSULE    Take 1 capsule by mouth 2 times daily for 180 days. Max Daily Amount: 400 mg    ROPINIROLE (REQUIP) 0.25 MG TABLET    Take 1 tablet by mouth nightly    VALSARTAN (DIOVAN) 40 MG TABLET    Take 1 tablet by mouth daily       ALLERGIES     Penicillins and Sulfa antibiotics    FAMILY HISTORY        Family History   Problem Relation Age of Onset    Dementia Sister     Dementia Brother     Cancer Daughter     Breast Cancer Daughter 73    Breast Cancer Sister 28    Breast Cancer Mother 79    No Known Problems Father           SOCIAL HISTORY       Social History     Socioeconomic History    Marital status: Widowed   Tobacco Use    Smoking status: Never    Smokeless tobacco: Never   Vaping Use    Vaping Use: Never used   Substance and Sexual Activity    Alcohol use: Not Currently    Drug use: No     Social Determinants of Health     Financial Resource Strain: Low Risk  (05/18/2022)    Overall Financial Resource Strain (CARDIA)     Difficulty of Paying Living Expenses: Not hard at all   Transportation Needs: Unknown (05/18/2022)    PRAPARE - Transportation     Lack of Transportation (Non-Medical): No   Physical Activity: Inactive (07/14/2021)    Exercise Vital Sign     Days of Exercise per Week: 0 days     Minutes of Exercise per Session: 0 min   Stress: No Stress Concern Present (07/14/2021)    [place]     Feeling of Stress : Not at all   Social Connections: Socially Isolated (07/14/2021)    Social Connection and Isolation Panel [NHANES]     Frequency of Communication with Friends and Family: Once a week     Frequency of Social Gatherings with Friends and Family: Once a week     Attends Religious Services: 1 to 4 times per year     Active Member of [employer] or Organizations: No     Attends Archivist Meetings: Never  Marital Status: Widowed   Intimate Partner Violence: Not At Risk (07/14/2021)    Humiliation, Afraid, Rape, and Kick questionnaire     Fear of Current or Ex-Partner: No     Emotionally Abused: No     Physically Abused: No     Sexually Abused: No   Housing Stability: Unknown (05/18/2022)    Housing Stability Vital Sign     Unstable Housing in the Last Year: No       SCREENINGS         Glasgow Coma Scale  Eye Opening:  Spontaneous  Best Verbal Response: Oriented  Best Motor Response: Obeys commands  Glasgow Coma Scale Score: 15                     CIWA Assessment  BP: (!) 169/82  Pulse: 95                 PHYSICAL EXAM    (up to 7 for level 4, 8 or more for level 5)     ED Triage Vitals [09/12/22 1323]   BP Temp Temp Source Pulse Respirations SpO2 Height Weight - Scale   (!) 169/82 97.9 F (36.6 C) Oral 95 18 95 % -- 64 kg (141 lb 1.5 oz)       Physical Exam  Vitals and nursing note reviewed.   Constitutional:       General: She is not in acute distress.     Appearance: Normal appearance. She is not ill-appearing, toxic-appearing or diaphoretic.   HENT:      Head: Normocephalic and atraumatic.      Nose: Nose normal.      Mouth/Throat:      Mouth: Mucous membranes are moist.   Eyes:      Extraocular Movements: Extraocular movements intact.      Conjunctiva/sclera: Conjunctivae normal.      Pupils: Pupils are equal, round, and reactive to light.   Cardiovascular:      Comments: Warm and well perfused  Pulmonary:      Effort: Pulmonary effort is normal.   Musculoskeletal:         General: Normal range of motion.      Cervical back: Normal range of motion.   Skin:     General: Skin is warm and dry.   Neurological:      General: No focal deficit present.      Mental Status: She is alert and oriented to person, place, and time.      GCS: GCS eye subscore is 4. GCS verbal subscore is 5. GCS motor subscore is 6.      Cranial Nerves: Cranial nerves 2-12 are intact.      Sensory: Sensory deficit present.      Motor: Motor function is intact.   Psychiatric:         Mood and Affect: Mood normal.         Behavior: Behavior normal.         Thought Content: Thought content normal.         Judgment: Judgment normal.         DIAGNOSTIC RESULTS     EKG: All EKG's are interpreted by the Emergency Department Physician who either signs or Co-signs this chart in the absence of a cardiologist.        RADIOLOGY:   Non-plain film images such as CT,  Ultrasound and MRI are read by the radiologist. Plain radiographic images are visualized  and preliminarily interpreted by the emergency physician with the below findings:        Interpretation per the Radiologist below, if available at the time of this note:    XR CHEST PORTABLE   Final Result   1. Enlarged cardiac silhouette, borderline interstitial edema         CT HEAD WO CONTRAST    (Results Pending)   CTA HEAD NECK W CONTRAST    (Results Pending)         ED BEDSIDE ULTRASOUND:   Performed by ED Physician - none    LABS:  Labs Reviewed   CBC WITH AUTO DIFFERENTIAL - Abnormal; Notable for the following components:       Result Value    Hemoglobin 11.3 (*)     MCV 69.9 (*)     MCH 22.2 (*)     RDW 16.8 (*)     Lymphocytes % 10 (*)     Immature Granulocytes 1 (*)     Lymphocytes Absolute 0.6 (*)     Absolute Immature Granulocyte 0.1 (*)     All other components within normal limits   COMPREHENSIVE METABOLIC PANEL - Abnormal; Notable for the following components:    Chloride 112 (*)     Anion Gap 3 (*)     Glucose 103 (*)     AST 14 (*)     Albumin 2.8 (*)     Globulin 4.4 (*)     Albumin/Globulin Ratio 0.6 (*)     All other components within normal limits   EXTRA TUBES HOLD   PROTIME-INR   TROPONIN   POCT GLUCOSE       All other labs were within normal range or not returned as of this dictation.    EMERGENCY DEPARTMENT COURSE and DIFFERENTIAL DIAGNOSIS/MDM:   Vitals:    Vitals:    09/12/22 1323   BP: (!) 169/82   Pulse: 95   Resp: 18   Temp: 97.9 F (36.6 C)   TempSrc: Oral   SpO2: 95%   Weight: 64 kg (141 lb 1.5 oz)           Medical Decision Making  Amount and/or Complexity of Data Reviewed  Labs: ordered.  Radiology: ordered.  ECG/medicine tests: ordered.    Risk  Prescription drug management.  Decision regarding hospitalization.    This is a 89 year old female with past medical history, review of systems, physical exam as above, presenting with complaints of right-sided paresthesias.  Patient states that 2  days ago, after waking she developed numbness on the right side of her face that she states is still present.  She denies associated headache, focal weakness or other paresthesias.  She denies visual disturbance or tinnitus.  She denies previous stroke.  Upon arrival she is noted to be hypertensive, afebrile without tachycardia, satting well on room air.  On exam she has preserved cranial nerves, however endorses decreased sensation in the right lower extremity, motor and function intact.  Discussed with patient differential including CVA, electrolyte dyscrasia.  Noncontrast head CT ordered from triage as well as checks x-ray, CMP, CBC, EKG, cardiac enzymes.  Will add CTA, reassess, and make a disposition.        REASSESSMENT     ED Course as of 09/12/22 1523   Wed Sep 12, 2022   1330 1:30 PM  I have evaluated the patient as the Provider in Rapid Medical Evaluation (RME). I have reviewed her vital signs and the  triage nurse assessment. I have talked with the patient and any available family and advised that I am the provider in triage and have ordered the appropriate study to initiate their work up based on the clinical presentation during my assessment. I have advised that the patient will be accommodated in the Main ED as soon as possible. I have also requested to contact the triage nurse or myself immediately if the patient experiences any changes in their condition during this brief waiting period.  Amy M Fleming-Hoyt, APRN - NP    Right side numbness that started on Tuesday at 0800, HA.   Amy M Fleming-Hoyt, APRN - NP  1:31 PM   [AF]   1334 Sensation change to the RUE and RLE, no facial asymmetry or arm drop.   [AF]   1358 EKG shows normal sinus rhythm with a rate of 91 T wave inversion in lead I otherwise no ST or T wave abnormalities to suggest ischemia or infarct.  Normal intervals, normal axis. [DA]      ED Course User Index  [AF] Fleming-Hoyt, Amy M, APRN - NP  [DA] Andrey Farmer, MD     Perfect Serve  Consult for Admission  6:14 PM    ED Room Number: ER24/24  Patient Name and age:  Kimberly [name] 89 y.o.  female  Working Diagnosis:   1. Numbness        COVID-19 Suspicion: No  Sepsis present:  No  Reassessment needed: No  Code Status:  Full Code  Readmission: No  Isolation Requirements: no  Recommended Level of Care: telemetry  Department: [facility] Adult ED - [phone]  Consulting Provider: d/w Dr. Gordy Councilman        CONSULTS:  None    PROCEDURES:  Unless otherwise noted below, none     Procedures        FINAL IMPRESSION    No diagnosis found.      DISPOSITION/PLAN   DISPOSITION        PATIENT REFERRED TO:  No follow-up provider specified.    DISCHARGE MEDICATIONS:  New Prescriptions    No medications on file     Controlled Substances Monitoring:          No data to display                (Please note that portions of this note were completed with a voice recognition program.  Efforts were made to edit the dictations but occasionally words are mis-transcribed.)    Quinn Axe, MD (electronically signed)  Attending Emergency Physician           Jalina Blowers, Sol Blazing, MD  09/12/22 [phone]

## 2022-09-12 NOTE — ED Notes (Signed)
Attempted report to the floor, nurse to call back.

## 2022-09-12 NOTE — ED Notes (Signed)
TRANSFER - IN REPORT:    Verbal report received from [place], Rn on Kimberly [name]  being received from [place], [place] for routine progression of patient care      Report consisted of patient's Situation, Background, Assessment and   Recommendations(SBAR).     Information from the following report(s) Nurse Handoff Report, Index, ED Encounter Summary, ED SBAR, Adult Overview, Surgery Report, Intake/Output, MAR, and Recent Results was reviewed with the receiving nurse.    Opportunity for questions and clarification was provided.      Assessment completed upon patient's arrival to unit and care assumed.

## 2022-09-12 NOTE — Telephone Encounter (Signed)
Last office visit 08/21/2022 with Dr Tamala Julian  Next office visit 03/20/2023  Last med refill 07/25/2022

## 2022-09-12 NOTE — ED Triage Notes (Addendum)
Pt ambulatory to ED w/ c/o r.sided facial numbness starting at  0800am Tuesday morning, r. Arm pain x1 wk, HA for the last couple of days and increased fatigue/sleeping more over the last couple of days.     Denies chest pain, SOB, dizziness, blurry vision.

## 2022-09-13 ENCOUNTER — Observation Stay: Admit: 2022-09-13 | Payer: MEDICARE | Primary: Family Medicine

## 2022-09-13 ENCOUNTER — Observation Stay: Admit: 2022-09-13 | Discharge: 2022-09-19 | Payer: MEDICARE | Primary: Family Medicine

## 2022-09-13 LAB — COMPREHENSIVE METABOLIC PANEL
ALT: 19 U/L (ref 12–78)
AST: 17 U/L (ref 15–37)
Albumin/Globulin Ratio: 0.7 — ABNORMAL LOW (ref 1.1–2.2)
Albumin: 2.7 g/dL — ABNORMAL LOW (ref 3.5–5.0)
Alk Phosphatase: 94 U/L (ref 45–117)
Anion Gap: 3 mmol/L — ABNORMAL LOW (ref 5–15)
BUN: 14 MG/DL (ref 6–20)
Bun/Cre Ratio: 18 (ref 12–20)
CO2: 28 mmol/L (ref 21–32)
Calcium: 9 MG/DL (ref 8.5–10.1)
Chloride: 108 mmol/L (ref 97–108)
Creatinine: 0.76 MG/DL (ref 0.55–1.02)
Est, Glom Filt Rate: >60 mL/min/{1.73_m2} (ref >60)
Globulin: 4.1 g/dL — ABNORMAL HIGH (ref 2.0–4.0)
Glucose: 123 mg/dL — ABNORMAL HIGH (ref 65–100)
Potassium: 4.1 mmol/L (ref 3.5–5.1)
Sodium: 139 mmol/L (ref 136–145)
Total Bilirubin: 0.3 MG/DL (ref 0.2–1.0)
Total Protein: 6.8 g/dL (ref 6.4–8.2)

## 2022-09-13 LAB — ECHO (TTE) COMPLETE (PRN CONTRAST/BUBBLE/STRAIN/3D)
AV Area by Peak Velocity: 2.6 cm2
AV Area by VTI: 3.3 cm2
AV Mean Gradient: 5 mmHg
AV Mean Velocity: 1.1 m/s
AV Peak Gradient: 9 mmHg
AV Peak Velocity: 1.5 m/s
AV VTI: 20.1 cm
AV Velocity Ratio: 0.8
AVA/BSA Peak Velocity: 1.6 cm2/m2
AVA/BSA VTI: 2 cm2/m2
Ao Root Index: 1.8 cm/m2
Aortic Root: 3 cm
Ascending Aorta Index: 1.92 cm/m2
Ascending Aorta: 3.2 cm
Body Surface Area: 1.69 m2
E/E' Lateral: 7
E/E' Ratio (Averaged): 7.58
EF BP: 43 % — AB (ref 55–100)
IVSd: 1 cm — AB (ref 0.6–0.9)
LA Diameter: 2.5 cm
LA Size Index: 1.5 cm/m2
LA Volume A-L A4C: 35 mL (ref 22–52)
LA Volume A-L A4C: 37 mL (ref 22–52)
LA Volume A/L: 36 mL
LA Volume Index A-L A2C: 22 mL/m2 (ref 16–34)
LA Volume Index A-L A4C: 21 mL/m2 (ref 16–34)
LA Volume Index A/L: 22 mL/m2 (ref 16–34)
LA Volume Index MOD A2C: 22 ml/m2 (ref 16–34)
LA Volume Index MOD A4C: 20 ml/m2 (ref 16–34)
LA Volume MOD A2C: 36 mL (ref 22–52)
LA Volume MOD A4C: 33 mL (ref 22–52)
LA/AO Root Ratio: 0.83
LV E' Lateral Velocity: 7 cm/s
LV E' Septal Velocity: 6 cm/s
LV EDV A2C: 57 mL
LV EDV A4C: 55 mL
LV EDV BP: 58 mL (ref 56–104)
LV EDV Index A2C: 34 mL/m2
LV EDV Index A4C: 33 mL/m2
LV EDV Index BP: 35 mL/m2
LV ESV A2C: 32 mL
LV ESV A4C: 29 mL
LV ESV BP: 33 mL (ref 19–49)
LV ESV Index A2C: 19 mL/m2
LV ESV Index A4C: 17 mL/m2
LV ESV Index BP: 20 mL/m2
LV Ejection Fraction A2C: 44 %
LV Ejection Fraction A4C: 48 %
LVIDd: 3.5 cm — AB (ref 3.9–5.3)
LVIDd: 3.7 cm — AB (ref 3.9–5.3)
LVIDs: 2.2 cm
LVIDs: 2.7 cm
LVOT Area: 3.1 cm2
LVOT Diameter: 2 cm
LVOT Mean Gradient: 4 mmHg
LVOT Peak Gradient: 6 mmHg
LVOT Peak Velocity: 1.2 m/s
LVOT SV: 65.3 ml
LVOT Stroke Volume Index: 39.1 mL/m2
LVOT VTI: 20.8 cm
LVOT:AV VTI Index: 1.03
LVPWd: 1 cm — AB (ref 0.6–0.9)
MV A Velocity: 0.89 m/s
MV Area by PHT: 3.7 cm2
MV E Velocity: 0.49 m/s
MV E Wave Deceleration Time: 206.2 ms
MV E/A: 0.55
MV PHT: 59.8 ms
PV Max Velocity: 1.2 m/s
PV Peak Gradient: 5 mmHg
RV Free Wall Peak S': 21 cm/s
RV Longitudinal Dimension: 4.9 cm
RV Mid Dimension: 1.9 cm
RVIDd: 2.8 cm
TR Max Velocity: 2.69 m/s
TR Peak Gradient: 29 mmHg

## 2022-09-13 LAB — CBC WITH AUTO DIFFERENTIAL
Absolute Immature Granulocyte: 0 10*3/uL (ref 0.00–0.04)
Basophils %: 1 % (ref 0–1)
Basophils Absolute: 0.1 10*3/uL (ref 0.0–0.1)
Eosinophils %: 8 % — ABNORMAL HIGH (ref 0–7)
Eosinophils Absolute: 0.3 10*3/uL (ref 0.0–0.4)
Hematocrit: 34 % — ABNORMAL LOW (ref 35.0–47.0)
Hemoglobin: 10.4 g/dL — ABNORMAL LOW (ref 11.5–16.0)
Immature Granulocytes: 0 % (ref 0.0–0.5)
Lymphocytes %: 19 % (ref 12–49)
Lymphocytes Absolute: 0.9 10*3/uL (ref 0.8–3.5)
MCH: 21.7 PG — ABNORMAL LOW (ref 26.0–34.0)
MCHC: 30.6 g/dL (ref 30.0–36.5)
MCV: 70.8 FL — ABNORMAL LOW (ref 80.0–99.0)
MPV: 11.1 FL (ref 8.9–12.9)
Monocytes %: 15 % — ABNORMAL HIGH (ref 5–13)
Monocytes Absolute: 0.7 10*3/uL (ref 0.0–1.0)
Neutrophils %: 57 % (ref 32–75)
Neutrophils Absolute: 2.5 10*3/uL (ref 1.8–8.0)
Nucleated RBCs: 0 PER 100 WBC
Platelets: 252 10*3/uL (ref 150–400)
RBC: 4.8 M/uL (ref 3.80–5.20)
RDW: 16.1 % — ABNORMAL HIGH (ref 11.5–14.5)
WBC: 4.4 10*3/uL (ref 3.6–11.0)
nRBC: 0 10*3/uL (ref 0.00–0.01)

## 2022-09-13 LAB — TSH: TSH, 3RD GENERATION: 1.06 u[IU]/mL (ref 0.36–3.74)

## 2022-09-13 LAB — LIPID PANEL
Chol/HDL Ratio: 2.5 (ref 0.0–5.0)
Cholesterol, Total: 134 MG/DL (ref <200)
HDL: 54 MG/DL
LDL Calculated: 69.4 MG/DL (ref 0–100)
Triglycerides: 53 MG/DL (ref <150)
VLDL Cholesterol Calculated: 10.6 MG/DL

## 2022-09-13 LAB — VITAMIN B12 & FOLATE
Folate: 22.3 ng/mL — ABNORMAL HIGH (ref 5.0–21.0)
Vitamin B-12: >2000 pg/mL — ABNORMAL HIGH (ref 193–986)

## 2022-09-13 LAB — HEMOGLOBIN A1C
Estimated Avg Glucose: 105 mg/dL
Hemoglobin A1C: 5.3 % (ref 4.0–5.6)

## 2022-09-13 LAB — PHOSPHORUS: Phosphorus: 3.8 MG/DL (ref 2.6–4.7)

## 2022-09-13 LAB — MAGNESIUM: Magnesium: 2.3 mg/dL (ref 1.6–2.4)

## 2022-09-13 LAB — C-REACTIVE PROTEIN: CRP: 2.42 mg/dL — ABNORMAL HIGH (ref 0.00–0.30)

## 2022-09-13 LAB — TROPONIN
Troponin, High Sensitivity: 17 ng/L (ref 0–51)
Troponin, High Sensitivity: 13 ng/L (ref 0–51)

## 2022-09-13 LAB — BRAIN NATRIURETIC PEPTIDE: NT Pro-BNP: 337 PG/ML (ref <450)

## 2022-09-13 MED ORDER — ENOXAPARIN SODIUM 40 MG/0.4ML IJ SOSY
400.4 MG/0.4ML | Freq: Every day | INTRAMUSCULAR | Status: DC
Start: 2022-09-13 — End: 2022-09-14
  Administered 2022-09-13 – 2022-09-14 (×2): 40 mg via SUBCUTANEOUS

## 2022-09-13 MED ORDER — NORMAL SALINE FLUSH 0.9 % IV SOLN
0.9 % | Freq: Two times a day (BID) | INTRAVENOUS | Status: DC
Start: 2022-09-13 — End: 2022-09-14
  Administered 2022-09-13 – 2022-09-14 (×3): 10 mL via INTRAVENOUS

## 2022-09-13 MED ORDER — ONDANSETRON 4 MG PO TBDP
4 MG | Freq: Three times a day (TID) | ORAL | Status: DC | PRN
Start: 2022-09-13 — End: 2022-09-14

## 2022-09-13 MED ORDER — SODIUM CHLORIDE 0.9 % IV SOLN
0.9 % | INTRAVENOUS | Status: DC | PRN
Start: 2022-09-13 — End: 2022-09-14

## 2022-09-13 MED ORDER — DONEPEZIL HCL 5 MG PO TABS
5 MG | Freq: Every day | ORAL | Status: DC
Start: 2022-09-13 — End: 2022-09-14
  Administered 2022-09-13: 21:00:00 5 mg via ORAL

## 2022-09-13 MED ORDER — DULOXETINE HCL 30 MG PO CPEP
30 MG | ORAL_CAPSULE | Freq: Every day | ORAL | 3 refills | Status: AC
Start: 2022-09-13 — End: 2023-01-15

## 2022-09-13 MED ORDER — ONDANSETRON HCL 4 MG/2ML IJ SOLN
42 MG/2ML | Freq: Four times a day (QID) | INTRAMUSCULAR | Status: DC | PRN
Start: 2022-09-13 — End: 2022-09-14

## 2022-09-13 MED ORDER — ATORVASTATIN CALCIUM 20 MG PO TABS
20 MG | Freq: Every evening | ORAL | Status: DC
Start: 2022-09-13 — End: 2022-09-14
  Administered 2022-09-13 – 2022-09-14 (×2): 20 mg via ORAL

## 2022-09-13 MED ORDER — DULOXETINE HCL 60 MG PO CPEP
60 MG | Freq: Every day | ORAL | Status: DC
Start: 2022-09-13 — End: 2022-09-14
  Administered 2022-09-13: 21:00:00 60 mg via ORAL

## 2022-09-13 MED ORDER — PREGABALIN 100 MG PO CAPS
100 MG | Freq: Two times a day (BID) | ORAL | Status: DC
Start: 2022-09-13 — End: 2022-09-14
  Administered 2022-09-13 – 2022-09-14 (×4): 200 mg via ORAL

## 2022-09-13 MED ORDER — ROPINIROLE HCL 0.25 MG PO TABS
0.25 MG | Freq: Every evening | ORAL | Status: DC
Start: 2022-09-13 — End: 2022-09-14
  Administered 2022-09-13 – 2022-09-14 (×2): 0.25 mg via ORAL

## 2022-09-13 MED ORDER — NORMAL SALINE FLUSH 0.9 % IV SOLN
0.9 % | INTRAVENOUS | Status: DC | PRN
Start: 2022-09-13 — End: 2022-09-14

## 2022-09-13 MED ORDER — POLYETHYLENE GLYCOL 3350 17 G PO PACK
17 g | Freq: Every day | ORAL | Status: DC | PRN
Start: 2022-09-13 — End: 2022-09-14

## 2022-09-13 MED ORDER — ASPIRIN 81 MG PO CHEW
81 MG | Freq: Every day | ORAL | Status: DC
Start: 2022-09-13 — End: 2022-09-14
  Administered 2022-09-13 – 2022-09-14 (×2): 81 mg via ORAL

## 2022-09-13 MED ORDER — VALSARTAN 40 MG PO TABS
40 MG | Freq: Every day | ORAL | Status: DC
Start: 2022-09-13 — End: 2022-09-14
  Administered 2022-09-13 – 2022-09-14 (×2): 40 mg via ORAL

## 2022-09-13 MED FILL — CHILDRENS ASPIRIN 81 MG PO CHEW: 81 MG | ORAL | Qty: 1

## 2022-09-13 MED FILL — LYRICA 100 MG PO CAPS: 100 MG | ORAL | Qty: 2

## 2022-09-13 MED FILL — DONEPEZIL HCL 5 MG PO TABS: 5 MG | ORAL | Qty: 1

## 2022-09-13 MED FILL — ROPINIROLE HCL 0.25 MG PO TABS: 0.25 MG | ORAL | Qty: 1

## 2022-09-13 MED FILL — LIPITOR 20 MG PO TABS: 20 MG | ORAL | Qty: 1

## 2022-09-13 MED FILL — MONOJECT FLUSH SYRINGE 0.9 % IV SOLN: 0.9 % | INTRAVENOUS | Qty: 40

## 2022-09-13 MED FILL — ENOXAPARIN SODIUM 40 MG/0.4ML IJ SOSY: 40 MG/0.4ML | INTRAMUSCULAR | Qty: 0.4

## 2022-09-13 MED FILL — DULOXETINE HCL 60 MG PO CPEP: 60 MG | ORAL | Qty: 1

## 2022-09-13 MED FILL — VALSARTAN 40 MG PO TABS: 40 MG | ORAL | Qty: 1

## 2022-09-13 NOTE — Progress Notes (Signed)
Speech LAnguage Pathology EVALUATION/DISCHARGE    Patient: Kimberly [name] (89 y.o. female)  Date: 09/13/2022  Primary Diagnosis: Numbness [R20.0]  Acute CVA (cerebrovascular accident) [facility]) [I63.9]       Precautions:  Fall Risk                  ASSESSMENT :  Based on the objective data described below, the patient presents with functional oropharyngeal swallow. Observed with breakfast tray and patient with mildly prolonged mastication of solid and delayed posterior propulsion; however, fully clears oral cavity with independent use of liquid wash. No overt s/s aspiration with successive straw sips of thins.     Patient reports resolved facial numbness. MRI not yet resulted at time of visit; however, note results are negative for acute CVA.     Patient will benefit from skilled intervention to address the above impairments.     PLAN :  Recommendations and Planned Interventions:  Diet: Regular and thin liquids. Straw OK       Acute SLP Services: No, patient will be discharged from acute skilled speech-language pathology at this time.    Discharge Recommendations: No, additional SLP treatment not indicated at discharge     SUBJECTIVE:   Patient with family at bedside    OBJECTIVE:     Past Medical History:   Diagnosis Date    Adverse effect of anesthesia     slow to wake    Anginal pain ([place])     Arthritis     Breast cancer ([place]) 03/10/2018     left lumpectomy    Cancer ([place]) 01/2018    breast    Chronic pain     neuropathy    Constipation     GERD (gastroesophageal reflux disease)     Headache     Hearing loss     High cholesterol     Hypertension     Incontinence     Joint pain     Memory disorder     Muscle pain     Osteoporosis     Ringing in the ears     Snoring     Visual disturbance      Past Surgical History:   Procedure Laterality Date    BREAST LUMPECTOMY Left 03/10/2018    LEFT BREAST LUMPECTOMY WITH ULTRASOUND GUIDANCE performed by Luellen Pucker, MD at [place] (CERVIX STATUS  UNKNOWN)      ORTHOPEDIC SURGERY      epidural pain mtg - pt unsure    OTHER SURGICAL HISTORY Right 10/15/2016    ligament correction right foot    OTHER SURGICAL HISTORY  11/26/2016    Pituitary tumor removal    US BREAST BIOPSY W LOC DEVICE 1ST LESION LEFT Left 01/21/2018    US BREAST NEEDLE BIOPSY LEFT 01/21/2018 [facility] [place]     Prior Level of Function/Home Situation:   Social/Functional History  Lives With: Family  Type of Home: House  Home Layout: One level  Home Access: Stairs to enter with rails  Entrance Stairs - Number of Steps: 2  Entrance Stairs - Rails: Both  Home Equipment: Kasandra Knudsen, Rollator  Has the patient had two or more falls in the past year or any fall with injury in the past year?: Yes (fall ~3-4 [place] Help From: Family  Ambulation Assistance: Independent  Transfer Assistance: Independent  Active Driver: No    Baseline Assessment:     Cognitive and  Communication Status:  Neurologic State: Alert  Cognition: Follows commands    Dysphagia:  Oral Assessment:  Oral Motor   Labial: No impairment  Dentition: Intact  Oral Hygiene: Moist  Oral Hygiene Comments: clean, moist  Lingual: No impairment  Velum: Unable to visualize  P.O. Trials:  PO Trials  Neuromuscular Estim Used: No  Assessment Method(s): Observation  Patient Position: up in bed  Vocal Quality: No Impairment  Consistency Presented: Thin;Regular  How Presented: Self-fed/presented;Straw;Successive Swallows;Cup/sip  Bolus Acceptance: No impairment  Bolus Formation/Control: No impairment  Propulsion: No impairment  Oral Residue: None  Aspiration Signs/Symptoms: None  Effective Modifications: None              Respiratory Status/Airway:  Room air               Functional Oral Intake Scale (FOIS): 7--Total oral diet with no restrictions    After treatment:   Patient left in no apparent distress sitting up in chair, Call bell left within reach, Nursing notified, and Caregiver present    COMMUNICATION/EDUCATION:       The patient's plan of  care including recommendations, planned interventions, and recommended diet changes were discussed with: Registered nurse    Patient/family agree to work toward stated goals and plan of care    Thank you,  Marilynn Rail, SLP  Minutes: 10

## 2022-09-13 NOTE — H&P (Unsigned)
[facility]  HISTORY AND PHYSICAL    Name:  Kimberly [name], Kimberly [name]  MR#:  [id number]  DOB:  1924/05/31  ACCOUNT #:  [id number]  ADMIT DATE:  09/12/2022      The patient was seen, evaluated, and admitted by me on 09/12/2022.    PRIMARY CARE PHYSICIAN:  Jonnie Kind, MD    SOURCE OF INFORMATION:  The patient and review of ED electronic medical records.    CHIEF COMPLAINT:  Right-sided facial numbness.    HISTORY OF PRESENT ILLNESS:  This is a 89 year old woman with a past medical history significant for dementia, hypertension, and pituitary macroadenoma, presented at the emergency room with right-sided facial numbness.  The patient's symptoms started at about 08:00 a.m. on Tuesday morning.  The patient also complained of right arm pain.  The right arm pain started about a week ago.  The patient has been having a headache for the past couple of days.  The patient was brought to the emergency room for further evaluation.  The patient is not a very good historian because of her underlying dementia, but when the patient arrived at the emergency room, CT of the head without contrast was obtained.  This was negative for acute pathology.  CTA of the head and neck was then obtained.  This did not show a large vessel occlusion, but did show a stable 14 x 13 mm pituitary mass consistent with macroadenoma.  The patient is clearly outside TNK window.  She was referred to the hospitalist service for admission for further evaluation for stroke.  The patient denies prior history of TIA or CVA.  There was no history of fever, rigors, or chills reported.    PAST MEDICAL HISTORY:  1.  Dementia.  2.  Hypertension.  3.  Pituitary macroadenoma.    ALLERGIES:  THE PATIENT IS ALLERGIC TO PENICILLIN AND SULFA.    CURRENT MEDICATIONS:  1.  Aspirin 81 mg daily.  2.  Lyrica 200 mg two times daily.  3.  Requip 0.25 mg daily at bedtime.  4.  Cymbalta 60 mg daily.  5.  Aricept 5 mg daily.  6.  Diovan 40 mg daily.    FAMILY HISTORY:   This was reviewed.  Her mother had breast cancer.    PAST SURGICAL HISTORY:  This is significant for left breast lumpectomy secondary to breast cancer and hysterectomy.    SOCIAL HISTORY:  No history of alcohol or tobacco abuse.    REVIEW OF SYSTEMS:  HEAD, EYES, EARS, NOSE, AND THROAT:  No headache.  No dizziness.  No blurring of vision.  No photophobia.  RESPIRATORY SYSTEM:  No cough.  No shortness of breath.  No hemoptysis.  CARDIOVASCULAR SYSTEM:  No chest pain.  No orthopnea.  No palpitation.  GASTROINTESTINAL SYSTEM:  No nausea or vomiting.  No diarrhea.  No constipation.  GENITOURINARY SYSTEM:  No dysuria.  No urgency and no frequency.    All other systems are reviewed and they are negative.    PHYSICAL EXAMINATION:  GENERAL APPEARANCE:  The patient appeared ill and in moderate distress.  VITAL SIGNS:  On arrival at the emergency room, temperature 97.9, respiratory rate 18, pulse of 95, blood pressure 169/82, and oxygen saturation 95%.  HEENT:  Head:  Normocephalic and atraumatic.  Eyes:  Normal eye movement.  No redness.  No drainage.  No discharge.  Ears:  Normal external ears with no evidence of drainage.  Nose:  No deformity and no drainage.  Mouth and Throat:  No visible oral lesions.  NECK:  Neck is supple.  No JVD.  No thyromegaly.  CHEST:  Clear breath sounds.  No wheezing.  No crackles.  HEART:  Normal S1 and S2.  Regular.  No clinically appreciable murmur.  ABDOMEN:  Soft and nontender.  Normal bowel sounds.  CNS:  Alert and oriented x3.  No gross focal neurological deficits.  EXTREMITIES:  No edema.  Pulses are 2+ bilaterally.  MUSCULOSKELETAL SYSTEM:  No obvious joint deformity or swelling.  SKIN:  No active skin lesions seen in the exposed part of the body.  PSYCHIATRY:  Normal mood and affect.  LYMPHATIC SYSTEM:  No cervical lymphadenopathy.    DIAGNOSTIC DATA:  EKG shows normal sinus rhythm and nonspecific ST and T-waves abnormalities.  Chest x-ray shows borderline interstitial edema.  CT scan  of the head without contrast, no acute abnormality.  CTA of the head and neck, no large vessel occlusion.    LABORATORY DATA:  Coagulation Profile:  INR 1.0 and PT 10.9.  Chemistry:  Sodium 142, potassium 4.4, chloride 112, CO2 of 27, glucose 103, BUN 15, creatinine 0.88, calcium 9.4, total bilirubin 0.4, ALT 19, AST 14, alkaline phosphatase 89, total protein 7.2, albumin level 2.8, and globulin at 4.4.  Hematology:  WBC 5.7, hemoglobin 11.3, hematocrit 35.6, and platelets 260.  Cardiac Profile:  Troponin high-sensitivity 10.    ASSESSMENT:  1.  Suspected acute cerebrovascular accident.  2.  Dementia.  3.  Hypertension.  4.  Pituitary macroadenoma.    PLAN:  1.  Suspected acute CVA:  We will admit the patient for further evaluation and treatment.  We will obtain a MRI of the brain as well as echocardiogram.  We will continue with aspirin.  We will start the patient on Lipitor.  Inpatient Neurology consult will be requested to assist in further evaluation and treatment.  We will check a C-reactive protein level to evaluate the patient for systemic inflammation.  We will check B12 and folic acid levels.  2.  Dementia:  We will continue with preadmission medication.  We will check B12 and folate levels.  3.  Hypertension:  We will resume preadmission medication and monitor the patient's blood pressure closely.  4.  Pituitary macroadenoma:  We will await results of MRI of the brain.  The patient may require Neurosurgery consult depending on the result of the MRI of the brain.    OTHER ISSUES:  Code Status:  The patient is a full code.  We will place the patient on Lovenox for DVT prophylaxis.    FUNCTIONAL STATUS PRIOR TO ADMISSION:  The patient came from home.  The patient is ambulatory with assistive device.    COVID PRECAUTION:  I was wearing a face mask and gloves for this patient's encounter.      Kimberly Pick, MD      [id number]  D:  09/13/2022 5:51  T:  09/13/2022 6:13  JOB #:  [id number]  CC:  Jonnie Kind, MD

## 2022-09-13 NOTE — Consults (Signed)
NEUROLOGY CONSULT NOTE    Name Kimberly [name] Age 89 y.o.   MRN [id number] DOB 1924/06/27     Consulting Physician: Jaye Beagle, MD      Chief Complaint:  R facial numbness       Assessment:     Principal Problem:    Acute CVA (cerebrovascular accident) [facility])  Active Problems:    Numbness  Resolved Problems:    * No resolved hospital problems. *    Patient is a 89 year-old female with h/o dementia, HTN, HLD, breast CA, chronic neuropathic pain and pituitary macroadenoma s/[name] resection with known recurrence who presented to ED yesterday after having R facial numbness with unclear onset yesterday versus 2 days prior that resolved overnight. She had a headache associated with the numbness that resolved.  CT head no acute abnormality. CTA head/neck with no LVO or significant intracranial atherosclerosis. MRI brain w wo shows no acute infarct or other abnormality. Pituitary macroadenoma without optic nerve mass effect stable on CTA per radiology compared to MRI 05/2022.   Vit B 12 >2000, LDL 69.4, A1c 5.3    R facial numbness resolved possible related to headache, less likely TIA  Recommendations:   - Continue home aspirin 81 mg daily  - Continue Lyrica 200 mg BID   - Already has MRI L-spine scheduled on 2/14 at [facility] to eval for lumbosacral radiculopathy ordered outpatient. Advised family they could cancel imaging if they wanted and her symptoms improve. Would not be aa surgical candidate given age.   - A1c at goal  - Advised her not to take any B12 supplements or multivitamins given elevated level   - PT/OT following      No further recommendations. Patient stable for discharge from Neuro standpoint today.   History of Present Illness:      This is a 89 y.o. female with history of dementia, remote history of migraines, HTN, HLD, breast cancer s/[name] lumpectomy, chronic neuropathic pain, and pituitary macroadenoma s/[name] resection with Dr. Candiss [name] 2018 known recurrence on imaging who presented to the ED yesterday with  R facial numbness and Neurology is subsequently consulted. Patient states facial numbness started yesterday during the day and then resolved overnight. Documentation shows that facial numbness started 2 days ago. She was believed to be outside of the window for intervention and a Code S as not done. She was noted to be hypertensive on initial presentation. She has also been having a headache for the past few days. She has no other symptoms and difficult to prescribe headache. She cannot tell me how frequently she gets headaches but states this headache was 5-6/10/.  She has a known pituitary mass that was stable on CTA head/neck. She is followed in the Neurology clinic and was recently admitted and evaluated by Neurology for outpatient follow-up at  [facility] with Dr. Tamala [name] for progressive, burning pain in her hands and feet. She was on Lyrica 200  mg BID and symptoms did not improve. She states her symptoms now are better controlled since starting weekly acupuncture. The pain had been chronic and becoming worse and intolerable and she does feel that it is still intermittent. She had an EMG prior to this visit that showed moderate to severe neuropathy with unknown cause. Previous MRI c-spine showed moderate to severe arthritis but no real surgical disease. She also has bilateral foot drop likely related to the neuropathy. She denies headaches or new visual symptoms. She was also started on Aricept. An MRI L-spine was  also ordered outpatient. History is somewhat limited by dementia. Patient lives with niece and ambulates with walker. A different niece is present at the bedside.     Allergies   Allergen Reactions    Penicillins Hives and Myalgia    Sulfa Antibiotics      Other reaction(s): Unknown (comments)        Prior to Admission medications    Medication Sig Start Date End Date Taking? Authorizing Provider   valsartan (DIOVAN) 40 MG tablet Take 1 tablet by mouth daily 09/06/22   Cathlyn Parsons, MD    acetaminophen-codeine (TYLENOL #3) 300-30 MG per tablet  07/22/22   [provider]   donepezil (ARICEPT) 5 MG tablet Take 1 tablet by mouth daily (before dinner) 08/21/22   Kimberly Pollack, MD   DULoxetine (CYMBALTA) 60 MG extended release capsule Take 1 capsule by mouth daily (before dinner) 07/25/22   Kimberly Pollack, MD   rOPINIRole (REQUIP) 0.25 MG tablet Take 1 tablet by mouth nightly 06/20/22   Kimberly Grice P, DO   b complex vitamins capsule Take 1 capsule by mouth daily    [provider]   Cholecalciferol (VITAMIN D3) 50 MCG (2000 UT) CAPS Take by mouth    [provider]   ammonium lactate (LAC-HYDRIN) 12 % lotion Apply 1 Application topically 2 times daily as needed 03/29/22   [provider]   ascorbic acid (VITAMIN C) 100 MG tablet Take 1 tablet by mouth daily as needed    [provider]   pregabalin (LYRICA) 200 MG capsule Take 1 capsule by mouth 2 times daily for 180 days. Max Daily Amount: 400 mg 03/08/22 09/06/22  Kimberly Poot, APRN - NP   acetaminophen (TYLENOL) 500 MG tablet Take by mouth every 6 hours as needed    Automatic Reconciliation, Ar   aspirin 81 MG chewable tablet Take by mouth daily    Automatic Reconciliation, Ar       Past Medical History:   Diagnosis Date    Adverse effect of anesthesia     slow to wake    Anginal pain (HCC)     Arthritis     Breast cancer ([place]) 03/10/2018     left lumpectomy    Cancer ([place]) 01/2018    breast    Chronic pain     neuropathy    Constipation     GERD (gastroesophageal reflux disease)     Headache     Hearing loss     High cholesterol     Hypertension     Incontinence     Joint pain     Memory disorder     Muscle pain     Osteoporosis     Ringing in the ears     Snoring     Visual disturbance         Past Surgical History:   Procedure Laterality Date    BREAST LUMPECTOMY Left 03/10/2018    LEFT BREAST LUMPECTOMY WITH ULTRASOUND GUIDANCE performed by Luellen Pucker, MD at [facility]  (CERVIX STATUS UNKNOWN)      ORTHOPEDIC SURGERY      epidural pain mtg - pt unsure    OTHER SURGICAL HISTORY Right 10/15/2016    ligament correction right foot    OTHER SURGICAL HISTORY  11/26/2016    Pituitary tumor removal    US BREAST BIOPSY W LOC DEVICE 1ST LESION LEFT Left 01/21/2018  US BREAST NEEDLE BIOPSY LEFT 01/21/2018 [facility] [place]        Social History     Tobacco Use    Smoking status: Never    Smokeless tobacco: Never   Substance Use Topics    Alcohol use: Not Currently        Family History   Problem Relation Age of Onset    Dementia Sister     Dementia Brother     Cancer Daughter     Breast Cancer Daughter 5    Breast Cancer Sister 71    Breast Cancer Mother 82    No Known Problems Father           Review of Systems:   Comprehensive review of systems performed and negative except for as listed above.     Exam:     BP (!) 148/88   Pulse 93   Temp 97.9 F (36.6 C) (Oral)   Resp 13   Wt 64 kg (141 lb 1.5 oz)   SpO2 93%   BMI 24.99 kg/m      General: Well developed, well nourished. Patient in no apparent distress   Head: Normocephalic, atraumatic, anicteric sclera   Lungs:  Clear to auscultation bilaterally, No wheezes or rubs   Cardiac: RRR   Abd: Bowel sounds presents x4 quadrants.    Ext: No pedal edema   Skin: No overt signs of rash     Neurological Exam:  Mental Status: A&Ox3, Follows commands   Speech: Fluent no aphasia or dysarthria.   Cranial Nerves:   VFF.  Facial sensation is normal. Facial movement is symmetric.     Eyes: PERRL, EOM's full, no nystagmus, no ptosis.   Motor:  4+/5 x4. Normal bulk and tone.   Reflexes:   Toes downgoing bilat.    Sensory:   Sensation intact to light touch bilat throughout   Gait:  Deferred   Tremor:   No tremor noted.   Cerebellar:  FTN and HTS intact           Imaging  I personally reviewed the following images.  CT Results (maximum last 3):  CT Result (most recent):  CTA HEAD NECK W CONTRAST 09/12/2022    Narrative  CLINICAL HISTORY: Right-sided  numbness    EXAMINATION:  CT ANGIOGRAPHY HEAD AND NECK    COMPARISON: November 2023    TECHNIQUE:  Following the uneventful administration of iodinated contrast  material, axial CT angiography of the head and neck was performed. Delayed axial  images through the head were also obtained. Coronal and sagittal reconstructions  were obtained. Manual postprocessing of images was performed. 3-D  Sagittal  maximal intensity projection images were obtained.  3-D Coronal maximal  intensity projections were obtained. CT dose reduction was achieved through use  of a standardized protocol tailored for this examination and automatic exposure  control for dose modulation. This study was analyzed by the [employer] algorithm.      FINDINGS:    Delayed contrast-enhanced head CT:    The ventricles are midline without hydrocephalus.  There is no acute intra or  extra-axial hemorrhage. There is an enhancing 14 x 13 mm mass filling the  suprasellar cistern. This is consistent with a pituitary mass, previously imaged  by MRI. The basal cisterns are clear.  Complete opacification of the left  maxillary sinus..    CTA NECK:    Great vessels: Normal arch anatomy with the origins patent.    Right subclavian artery: Patent  Left subclavian artery: Patent    Right common carotid artery: Patent    Left common carotid artery: Patent    Cervical right internal carotid artery: Mild calcified plaque with no  significant stenosis by NASCET criteria.    Cervical left internal carotid artery: Mild calcified plaque with no significant  stenosis by NASCET criteria.    Right vertebral artery: Diminutive right vertebral artery. There is an  anastomosis between the right external carotid artery and the right distal  vertebral artery.    Left vertebral artery: Diminutive left vertebral artery.    The lung apices are clear. The thyroid is heterogeneous. No cervical  lymphadenopathy.    CTA HEAD:    Right cavernous internal carotid artery: Patent    Left  cavernous internal carotid artery: Patent    Anterior cerebral arteries: Patent    Anterior communicating artery: Patent    Right middle cerebral artery: Patent    Left middle cerebral artery: Patent    Posterior communicating arteries: Well-developed posterior communicating  arteries.    Posterior cerebral arteries: Patent    Basilar artery: Patent    Distal vertebral arteries: Patent    No evidence for intracranial aneurysm or hemodynamically significant stenosis.    Impression  1.  No large vessel occlusion or hemodynamically significant carotid stenosis.  2.  Persistent anastomosis between the right external carotid artery and right  vertebral artery.  3.  Well-developed bilateral posterior communicating arteries.  4.  No significant intracranial atherosclerosis.  5.  Stable 14 x 13 mm pituitary mass consistent with a macroadenoma.        MRI Results (maximum last 3):  MRI Result (most recent):  MRI BRAIN WO CONTRAST 09/13/2022    Narrative  BRAIN MRI WITHOUT CONTRAST: 09/13/2022 8:20 AM    INDICATION: Cerebrovascular accident.    COMPARISON: 06/17/2022, CT to 724.    TECHNIQUE: Images were obtained in multiple planes and sequences to emphasize  T1, T2, and T2* information. Diffusion-weighted images and ADC maps were also  obtained.    FINDINGS: No restricted diffusion to suggest acute infarction. Scattered  periventricular and subcortical white matter signal abnormalities are consistent  with minimal, age-concordant, chronic, small vessel ischemic disease. The  ventricles and sulci are appropriate for age. The main intracranial arterial  flow-voids are normal. No hemorrhage. Lobulated enlargement of the pituitary is  consistent with a macroadenoma. This approaches the optic nerves, but does not  have mass effect on them. The left maxillary sinus is completely opacified.    Impression  1. No acute intracranial abnormality.  2. Pituitary macroadenoma. No mass effect on the optic chiasm.        Lab Review  I  personally reviewed the following labs.  Lab Results   Component Value Date/Time    WBC 4.4 09/13/2022 05:45 AM    HCT 34.0 09/13/2022 05:45 AM    HGB 10.4 09/13/2022 05:45 AM    PLT 252 09/13/2022 05:45 AM     Lab Results   Component Value Date/Time    NA 139 09/13/2022 12:07 AM    K 4.1 09/13/2022 12:07 AM    CL 108 09/13/2022 12:07 AM    CO2 28 09/13/2022 12:07 AM    BUN 14 09/13/2022 12:07 AM       Signed:  Maeola Sarah, ACNP  09/13/2022  10:39 AM

## 2022-09-13 NOTE — Care Coordination-Inpatient (Addendum)
Care Management Initial Assessment       RUR: 15%  Readmission? No  1st IM letter given? No- did not get at admissions  1st [employer] letter given: No n/a    Transition of Care: likely home with home health SN/PT/OT; pending patient choice  (patient has the [facility] list)    Transport Plan: in car with family or may need WC van (not set up yet)     Main contact: niece- Kimberly [name]- [phone]  and nephew- Kimberly [name]- [phone] and niece- Kimberly [name]- [phone]    1515: this CM met with pleasant patient at bedside; she is alert and oriented x 2-3; patient lives at stated address (one level home) with her niece Kimberly [name]; she has another supportive niece and nephew who assist; patient has a walker and cane and is moderate independent in her adls; she does not drive; she does not use home O2; pcp and insurance confirmed; this CM talked about recommendations for home health; patient is in agreement; she is reviewing the home health list and this time and will give this CM choice tomorrow        09/13/22 1521   Service Assessment   Patient Orientation Alert and Oriented   Cognition Alert   Primary [place] Family Members   Patient's [place] is: Legal Next of [place]   PCP Verified by CM Yes   Last Visit to PCP Within last 3 months   Prior Functional Level Assistance with the following:;Mobility   Current Functional Level Assistance with the following:;Mobility   Can patient return to prior living arrangement Yes   Ability to make needs known: Good   Financial Resources Medicare   Social/Functional History   Lives With Family  (niece- Kimberly [name])   Type of [place] One level   [place] to enter with rails   ADL Assistance Independent   Ambulation Assistance Needs assistance   Transfer Assistance Independent   Active Driver No   Occupation Retired   Dentist   Type of [place] Family Members  (lives with her  niece)   Patient expects to be discharged to: House   History of falls? 1     CM following   Deliah Goody, RN

## 2022-09-13 NOTE — Progress Notes (Signed)
I prayed with Kimberly [name] and her niece and gave them a blessing.  Father Theola Sequin

## 2022-09-13 NOTE — Progress Notes (Signed)
Occupational Therapy  09/13/22    Order received, chart reviewed. Attempted to see Pt 2x, once Pt self feeding and requesting additional time to complete, and second Pt with bedside echo. Will follow up as able.    Thank you!  Lorine Bears, OT

## 2022-09-13 NOTE — Telephone Encounter (Signed)
I called the daughter, her B12 level was high, so we will stop the B12 supplement, her MRI scan showed no stroke, I do not know why the right face is numb, but her arm pain does persist and has most likely a cervical radiculopathy.  We will see her in August at her regular scheduled visit

## 2022-09-13 NOTE — Plan of Care (Signed)
Problem: Occupational Therapy - Adult  Goal: By Discharge: Performs self-care activities at highest level of function for planned discharge setting.  See evaluation for individualized goals.  Description: FUNCTIONAL STATUS PRIOR TO ADMISSION:  Pt lives with niece who assists with BADL/IADLs as needed. Pt is able to complete most self care tasks with increased time, so family generally assists. Pt wears bilateral AFOs at baseline and uses a rollator.           HOME SUPPORT: Patient lived alone with Niece to provide assistance.     Occupational Therapy Goals  Initiated 09/13/2022   1.  Patient will perform lower body dressing with Stand by Assist within 7 day(s).  2.  Patient will perform grooming standing at sink with Modified Independence within 7 day(s).  3.  Patient will perform toileting with Stand by Assist within 7 day(s).  4.  Patient will perform toilet transfers with Stand by Assist within 7 day(s).  5.  Patient will perform all aspects of toileting with Modified Independence within 7 day(s).  6.  Patient will participate in upper extremity therapeutic exercise/activities with Stand by Assist within 7 day(s).    7.  Patient will utilize energy conservation techniques during functional activities with verbal cues within 7 day(s).    Outcome: Progressing   OCCUPATIONAL THERAPY EVALUATION    Patient: Kimberly [name] (89 y.o. female)  Date: 09/13/2022  Primary Diagnosis: Numbness [R20.0]  Acute CVA (cerebrovascular accident) [facility]) [I63.9]         Precautions: Fall Risk                  ASSESSMENT :  The patient is limited by decreased functional mobility, independence in ADLs, high-level IADLs, ROM, strength, activity tolerance, endurance, balance, vision/visual deficit (baseline), dementia (baseline) s/p admission for R sided paresthesia. MRI showing no acute intracranial abnormality.     Based on the impairments listed above Pt requiring up to [place] with BADLs, which is below her baseline of Spv-MinA. Pt  demonstrating baseline deficits with vision, restricted R shoulder ROM decreasing FM score, and baseline dementia. Pt demonstrates good activity tolerance while standing to complete grooming tasks and WFL bilateral coordination. Requiring verbal cues for hand placement during functional transfers and CGA for steadying balance. Education provided on BE FAST with Pt verbalizing understanding. Recommend HH to maximize safety and independence in her home environment.     Functional Outcome Measure:  The patient scored 20/24 on the AMPAC outcome measure which is indicative of lower odds of benefiting from therapy at discharge.         PLAN :  Recommendations and Planned Interventions:   self care training, therapeutic activities, functional mobility training, balance training, therapeutic exercise, endurance activities, patient education, and home safety training    Frequency/Duration: OT Plan of Care: 5 times/week    Recommendation for discharge: (in order for the patient to meet his/her long term goals): Therapy 2 days/week in the home    Other factors to consider for discharge: no additional factors    IF patient discharges home will need the following DME: none       SUBJECTIVE:   Patient stated, "I live with my niece."  OBJECTIVE DATA SUMMARY:     Past Medical History:   Diagnosis Date    Adverse effect of anesthesia     slow to wake    Anginal pain ([place])     Arthritis     Breast cancer ([place]) 03/10/2018  left lumpectomy    Cancer ([place]) 01/2018    breast    Chronic pain     neuropathy    Constipation     GERD (gastroesophageal reflux disease)     Headache     Hearing loss     High cholesterol     Hypertension     Incontinence     Joint pain     Memory disorder     Muscle pain     Osteoporosis     Ringing in the ears     Snoring     Visual disturbance      Past Surgical History:   Procedure Laterality Date    BREAST LUMPECTOMY Left 03/10/2018    LEFT BREAST LUMPECTOMY WITH ULTRASOUND GUIDANCE performed by Luellen Pucker, MD at [place] (CERVIX STATUS UNKNOWN)      ORTHOPEDIC SURGERY      epidural pain mtg - pt unsure    OTHER SURGICAL HISTORY Right 10/15/2016    ligament correction right foot    OTHER SURGICAL HISTORY  11/26/2016    Pituitary tumor removal    US BREAST BIOPSY W LOC DEVICE 1ST LESION LEFT Left 01/21/2018    US BREAST NEEDLE BIOPSY LEFT 01/21/2018 [facility] [place]          Expanded or extensive additional review of patient history:   Lives With: Family  Type of Home: House  Home Layout: One level  Home Access: Stairs to enter with rails     Entrance Stairs - Rails: Both  Bathroom Shower/Tub: None (Walk in [employer] with seat)  Bathroom Toilet: Standard  Bathroom Equipment: Location manager, None (toilet raiser with arm rests)     Home Equipment: Radio producer, Rollator  Has the patient had two or more falls in the past year or any fall with injury in the past year?: Yes (fall ~3-4 months ago')        Hand Dominance: right     EXAMINATION OF PERFORMANCE DEFICITS:    Cognitive/Behavioral Status:  Orientation  Overall Orientation Status: Within Normal Limits  Orientation Level: Oriented X4  Cognition  Overall Cognitive Status: WNL    Skin: Appears intact    Edema: None present    Hearing: hard of hearing with bilateral hearing aids.       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  Visual Field Cut: No  Oculo Motor Control: WNL             Range of Motion:   AROM: Generally decreased, functional  PROM: Generally decreased, functional      Strength:  Strength: Generally decreased, functional      Coordination:  Coordination: Generally decreased, functional     Coordination: Within functional limits      Tone & Sensation:   Tone: Normal  Sensation: Intact    Functional Mobility and Transfers for ADLs:    Bed  Mobility:     Bed Mobility Training  Bed Mobility Training: No  Supine to Sit: Supervision  Scooting: Supervision    Transfers:      Pharmacologist: Yes  Interventions: Verbal cues;Tactile cues  Sit to Stand: Stand-by assistance;Adaptive equipment;Assist X1  Stand to Sit: Stand-by assistance;Adaptive equipment;Assist X1  Bed to Chair:  Contact-guard assistance;Assist X1          Functional Mobility: Contact guard assistance          Balance:   Standing: Impaired  Balance  Sitting: Intact  Standing: Impaired  Standing - Static: Good  Standing - Dynamic: Constant support;Fair      ADL Assessment:     Feeding: Modified independent   Feeding Skilled Clinical Factors: Pt self feeding Mod I.    Grooming: Stand by assistance  Grooming Skilled Clinical Factors: Pt standing at sink and completing oral care. Pt able to open containers and manage dentures without assistance.    UE Bathing: Stand by assistance  UE Bathing Skilled Clinical Factors: Inferred seated in tub    LE Bathing: Minimal assistance  LE Bathing Skilled Clinical Factors: Pt able to tailor sit, increased difficulty with RLE versus LLE.    UE Dressing: Stand by assistance  UE Dressing Skilled Clinical Factors: Inferred    LE Dressing: Moderate assistance  LE Dressing Skilled Clinical Factors: Pt able to tailor sit, increased difficulty with RLE versus LLE.    Toileting: Contact guard assistance  Toileting Skilled Clinical Factors: Inferred for standing balance when managing clothing.      Functional Mobility: Contact guard assistance  Functional Mobility Skilled Clinical Factors: RW, cues for hand placement during sit to stand/stand to sit transfers.     ADL Intervention and task modifications:    Patient and/or family was verbally educated on the BE FAST acronym for signs/symptoms of CVA and TIA. BE FAST was written on patient's communication board for visual education and reinforcement.  All questions answered with patient indicating  good understanding.     Fugl-Meyer Assessment of Motor Recovery after Stroke:     Reflex Activity  Flexors/Biceps/Fingers: Can be elicited  Extensors/Triceps: Can be elicited  Reflex Subtotal: 4    Volitional Movement Within Synergies  Shoulder Retraction: Partial  Shoulder Elevation: Partial  Shoulder Abduction (90 degrees): Partial  Shoulder External Rotation: Full  Elbow Flexion: Full  Forearm Supination: Full  Shoulder Adduction/Internal Rotation: Full  Elbow Extension: Full  Forearm Pronation: Full  Subtotal: 15    Volitional Movement Mixing Synergies  Hand to Lumbar Spine: Full  Shoulder Flexion (0-90 degrees): Full  Pronation-Supination: Full  Subtotal: 6    Volitional Movement With Little or No Synergy  Shoulder Abduction (0-90 degrees): Full  Shoulder Flexion (90-180 degrees): None  Pronation/Supination: Full  Subtotal: 4    Normal Reflex Activity  Biceps, Triceps, Finger Flexors: Full  Subtotal: 2    Upper Extremity Total   Upper Extremity Total: 31    Wrist  Stability at 15 Degree Dorsiflexion: Full  Repeated Dorsiflexion/ Volar Flexion: Full  Stability at 15 Degree Dorsiflexion: Full  Repeated Dorsiflexion/ Volar Flexion: Full  Circumduction: Full  Wrist Total: 10    Hand  Mass Flexion: Full  Mass Extension: Full  Grasp A: Full  Grasp B: Full  Grasp C: Full  Grasp D: Full  Grasp E: Full  Hand Total: 14    Coordination/Speed  Tremor: None  Dysmetria: None  Time: <1s  Coordination/Speed Total: 6    Total A-D  Total A-D (Motor Function): 61         This is a reliable/valid measure of arm function after a neurological event. It has established value to characterize functional status and for measuring spontaneous and therapy-induced recovery; tests proximal and distal motor functions. Fugl-Meyer Assessment - UE scores recorded between five and 30 days post  neurologic event can be used to predict UE recovery at six months post neurologic event.  Severe = 0-21 points   Moderately Severe = 22-33 points    Moderate = 34-47 points   Mild = 48-66 points  Dolores Hoose, D., Divine, G., & Feussner, J. (1992). Measurement of motor recovery after stroke: Outcome assessment and sample size requirements. Stroke, 23, pp. [phone].   --------------------------------------------------------------------------------------------------------------------------------------------------------------------  MCID:  Stroke:   Karlton Lemon et al, 2001; n = 171; mean age 33 (35) years; assessed within 93 (12) days of stroke, Acute Stroke)  FMA Motor Scores from Admission to Discharge   10 point increase in FMA Upper Extremity = 1.5 change in discharge FIM   10 point increase in FMA Lower Extremity = 1.9 change in discharge FIM  MDC:   Stroke:   Earleen Newport et al, 2008, n = 14, mean age = 59.9 (14.6) years, assessed on average 14 (6.5) months post stroke, Chronic Stroke)   FMA = 5.2 points for the Upper Extremity portion of the assessment         [employer] AM-PACTM "6 Clicks"                                                       Daily Activity Inpatient Short Form  How much help from another person does the patient currently need... Total; A Lot A Little None   1.  Putting on and taking off regular lower body clothing? '[]'$   1 '[]'$   2 '[x]'$   3 '[]'$   4   2.  Bathing (including washing, rinsing, drying)? '[]'$   1 '[]'$   2 '[x]'$   3 '[]'$   4   3.  Toileting, which includes using toilet, bedpan or urinal? '[]'$  1 '[]'$   2 '[x]'$   3 '[]'$   4   4.  Putting on and taking off regular upper body clothing? '[]'$   1 '[]'$   2 '[]'$   3 '[x]'$   4   5.  Taking care of personal grooming such as brushing teeth? '[]'$   1 '[]'$   2 '[x]'$   3 '[]'$   4   6.  Eating meals? '[]'$   1 '[]'$   2 '[]'$   3 '[x]'$   4    2007, Trustees [employer], under license to [employer]. All rights reserved     Score: 20/24     Interpretation of Tool:  Represents clinically-significant functional categories (i.e. Activities of daily living).    Cutoff score 39.4 (19) correlates to a good likelihood of discharging  home versus a facility  [facility], Jon Gills, Jeanella Flattery, Mena Goes. [place], Roslynn Amble. Annabell Howells, AM-PAC "6-Clicks" Functional Assessment Scores Predict [facility] Discharge Destination, Physical Therapy, Volume 94, Issue 9, 06 April 2013, Pages [phone], [email or web address]    Pain Rating:  0/10   Pain Intervention(s):   pain is at a level acceptable to the patient    Activity Tolerance:   Good    After treatment:   Patient left in no apparent distress sitting up in chair, Call bell within reach, Bed/ chair alarm activated, and Caregiver / family present    COMMUNICATION/EDUCATION:   The patient's plan of care was discussed with: physical therapist and registered nurse    Patient Education  Education Given To: Patient;Family  Education Provided: Role  of Therapy;Precautions;Plan of Psychologist, occupational Provided Comments: BE SMART education provided  Education Method: Verbal;Printed Information/Hand-outs  Barriers to Learning: None  Education Outcome: Verbalized understanding;Demonstrated understanding;Continued education needed    Thank you for this referral.  Lorine Bears, OT  Minutes: 28    Occupational Therapy Evaluation Charge Determination   History Examination Decision-Making   LOW Complexity : Brief history review  LOW Complexity: 1-3 Performance deficits relating to physical, cognitive, or psychosocial skills that result in activity limitations and/or participation restrictions LOW Complexity: No comorbidities that affect functional and  no verbal  or physical assist needed to complete eval tasks   Based on the above components, the patient evaluation is determined to be of the following complexity level: Low

## 2022-09-13 NOTE — Plan of Care (Signed)
Problem: Discharge Planning  Goal: Discharge to home or other facility with appropriate resources  Outcome: Progressing  Flowsheets (Taken 09/13/2022 0800)  Discharge to home or other facility with appropriate resources: Identify barriers to discharge with patient and caregiver     Problem: Safety - Adult  Goal: Free from fall injury  Outcome: Progressing     Problem: Chronic Conditions and Co-morbidities  Goal: Patient's chronic conditions and co-morbidity symptoms are monitored and maintained or improved  Outcome: Progressing  Flowsheets (Taken 09/13/2022 0800)  Care Plan - Patient's Chronic Conditions and Co-Morbidity Symptoms are Monitored and Maintained or Improved: Monitor and assess patient's chronic conditions and comorbid symptoms for stability, deterioration, or improvement     Problem: Physical Therapy - Adult  Goal: By Discharge: Performs mobility at highest level of function for planned discharge setting.  See evaluation for individualized goals.  Description: FUNCTIONAL STATUS PRIOR TO ADMISSION: Patient was modified independent using a rollator for functional mobility. Pt wears bilateral AFO's at baseline.     HOME SUPPORT PRIOR TO ADMISSION: The patient lived with niece and required assist for cooking, cleaning. Pt reported she is only alone ~30 minutes at a time.    Physical Therapy Goals  Initiated 09/13/2022  1.  Patient will move from supine to sit and sit to supine and roll side to side in bed with modified independence within 7 day(s).    2.  Patient will perform sit to stand with modified independence within 7 day(s).  3.  Patient will transfer from bed to chair and chair to bed with modified independence using the least restrictive device within 7 day(s).  4.  Patient will ambulate with modified independence for 150 feet with the least restrictive device within 7 day(s).   5.  Patient will ascend/descend 2 stairs with bilateral handrail(s) with supervision/set-up within 7 day(s).  6.  Patient  will improve Berg Balance score by 7 points within 7 days.    09/13/2022 0932 by Shea Evans, PT  Outcome: Progressing

## 2022-09-13 NOTE — Progress Notes (Signed)
[facility]                                                                                          Hospitalist Progress Note  Oletta Cohn, APRN - CNP  Answering service: [phone] OR [phone] from in house phone        Date of Service:  09/13/2022  NAME:  Kimberly [name]  DOB:  12/04/1923  MRN:  [id number]       Admission Summary:   This is a 89 year old woman with a past medical history significant for dementia, hypertension, and pituitary macroadenoma, presented at the emergency room with right-sided facial numbness. The patient's symptoms started at about 08:00 a.m. on Tuesday morning. The patient also complained of right arm pain. The right arm pain started about a week ago. The patient has been having a headache for the past couple of days. The patient was brought to the emergency room for further evaluation. The patient is not a very good historian because of her underlying dementia, but when the patient arrived at the emergency room, CT of the head without contrast was obtained. This was negative for acute pathology. CTA of the head and neck was then obtained. This did not show a large vessel occlusion, but did show a stable 14 x 13 mm pituitary mass consistent with macroadenoma. The patient is clearly outside TNK window. She was referred to the hospitalist service for admission for further evaluation for stroke. The patient denies prior history of TIA or CVA. There was no history of fever, rigors, or chills reported.        Interval history / Subjective:   2/8 Patient seen and examined on rounds this am, states that she feels "much better" denies fever chest pain shortness of breath, no n/v     Assessment & Plan:      Rule out CVA  -MRI, CT head, CTA head/neck without acute abnormality, pituitary tumor stable from 2017  -ECHO pending  -Neurology consulted  -PT/OT/SLR  -asa, atorvastatin  -HgA1c 5.3  -b6 and b12 elevated  -TSH wnl      Dementia  -continue home  aricept and cymbalta    HTN  -continue home losartan    Neuropathy  RLS  -continue home lyrica and ropinirole       Code status: full code  Prophylaxis: lovenox  Care Plan discussed with: patient, daughter  Anticipated Disposition: 1-2 days  Observation  Cardiac monitoring: Telemetry     Principal Problem:    Acute CVA (cerebrovascular accident) ([place])  Active Problems:    Numbness  Resolved Problems:    * No resolved hospital problems. *         Social Determinants of Health     Tobacco Use: Low Risk  (08/21/2022)    Patient History     Smoking Tobacco Use: Never     Smokeless Tobacco Use: Never     Passive Exposure: Not on file   Alcohol Use: Not At Risk (07/14/2021)    AUDIT-C     Frequency of Alcohol Consumption: Never     Average  Number of Drinks: Patient does not drink     Frequency of Binge Drinking: Never   Financial Resource Strain: Low Risk  (05/18/2022)    Overall Financial Resource Strain (CARDIA)     Difficulty of Paying Living Expenses: Not hard at all   Food Insecurity: Not on file (05/18/2022)   Transportation Needs: Unknown (05/18/2022)    PRAPARE - Armed forces logistics/support/administrative officer (Medical): Not on file     Lack of Transportation (Non-Medical): No   Physical Activity: Inactive (07/14/2021)    Exercise Vital Sign     Days of Exercise per Week: 0 days     Minutes of Exercise per Session: 0 min   Stress: No Stress Concern Present (07/14/2021)    [place]     Feeling of Stress : Not at all   Social Connections: Socially Isolated (07/14/2021)    Social Connection and Isolation Panel [NHANES]     Frequency of Communication with Friends and Family: Once a week     Frequency of Social Gatherings with Friends and Family: Once a week     Attends Religious Services: 1 to 4 times per year     Active Member of [employer] or Organizations: No     Attends Archivist Meetings: Never     Marital Status: Widowed   Intimate Partner Violence:  Not At Risk (07/14/2021)    Humiliation, Afraid, Rape, and Kick questionnaire     Fear of Current or Ex-Partner: No     Emotionally Abused: No     Physically Abused: No     Sexually Abused: No   Depression: Not at risk (08/21/2022)    PHQ-2     PHQ-2 Score: 0   Housing Stability: Unknown (05/18/2022)    Housing Stability Vital Sign     Unable to Pay for Housing in the Last Year: Not on file     Number of Places Lived in the Last Year: Not on file     Unstable Housing in the Last Year: No   Interpersonal Safety: Not At Risk (07/14/2021)    Humiliation, Afraid, Rape, and Kick questionnaire     Fear of Current or Ex-Partner: No     Emotionally Abused: No     Physically Abused: No     Sexually Abused: No   Utilities: Not on file       Review of Systems:   Pertinent items are noted in HPI.       Vital Signs:    Last 24hrs VS reviewed since prior progress note. Most recent are:  Vitals:    09/13/22 1000   BP:    Pulse: 93   Resp:    Temp:    SpO2:        No intake or output data in the 24 hours ending 09/13/22 1157     Physical Examination:     I had a face to face encounter with this patient and independently examined them on 09/13/2022 as outlined below:          General : alert x 3, awake, no acute distress,   HEENT: PEERL, EOMI, moist mucus membrane  Neck: supple, no JVD, no meningeal signs  Chest: Clear to auscultation bilaterally   CVS: S1 S2 heard, Capillary refill less than 2 seconds  Abd: soft/ non tender, non distended, BS physiological,   Ext: no clubbing, no cyanosis, no edema, brisk 2+ DP  pulses  Neuro/Psych: pleasant mood and affect, CN 2-12 grossly intact, sensory grossly within normal limit,  Skin: warm     Data Review:    Review and/or order of clinical lab test      I have personally and independently reviewed all pertinent labs, diagnostic studies, imaging, and have provided independent interpretation of the same.     Labs:     Recent Labs     09/12/22  1355 09/13/22  0545   WBC 5.7 4.4   HGB 11.3* 10.4*    HCT 35.6 34.0*   PLT 260 252     Recent Labs     09/12/22  1355 09/13/22  0007 09/13/22  0545   NA 142 139  --    K 4.4 4.1  --    CL 112* 108  --    CO2 27 28  --    BUN 15 14  --    MG  --   --  2.3   PHOS  --   --  3.8     Recent Labs     09/12/22  1355 09/13/22  0007   ALT 19 19   GLOB 4.4* 4.1*     Recent Labs     09/12/22  1355   INR 1.0      No results for input(s): "TIBC", "FERR" in the last 72 hours.    Invalid input(s): "FE", "PSAT"   No results found for: "FOL", "RBCF"   No results for input(s): "PH", "PCO2", "PO2" in the last 72 hours.  No results for input(s): "CPK" in the last 72 hours.    Invalid input(s): "CPKMB", "CKNDX", "TROIQ"  Lab Results   Component Value Date/Time    CHOL 134 09/13/2022 05:45 AM    HDL 54 09/13/2022 05:45 AM     No results found for: "GLUCPOC"  '@LABUA'$ @    Notes reviewed from all clinical/nonclinical/nursing services involved in patient's clinical care. Care coordination discussions were held with appropriate clinical/nonclinical/ nursing providers based on care coordination needs.         Patients current active Medications were reviewed, considered, added and adjusted based on the clinical condition today.      Home Medications were reconciled to the best of my ability given all available resources at the time of admission. Route is PO if not otherwise noted.      Admission Status:[id number]:::1}      Medications Reviewed:     Current Facility-Administered Medications   Medication Dose Route Frequency    aspirin chewable tablet 81 mg  81 mg Oral Daily    donepezil (ARICEPT) tablet 5 mg  5 mg Oral Daily before dinner    DULoxetine (CYMBALTA) extended release capsule 60 mg  60 mg Oral Daily before dinner    pregabalin (LYRICA) capsule 200 mg  200 mg Oral BID    rOPINIRole (REQUIP) tablet 0.25 mg  0.25 mg Oral Nightly    valsartan (DIOVAN) tablet 40 mg  40 mg Oral Daily    sodium chloride flush 0.9 % injection 5-40 mL  5-40 mL IntraVENous 2 times per day    sodium chloride flush  0.9 % injection 5-40 mL  5-40 mL IntraVENous PRN    0.9 % sodium chloride infusion   IntraVENous PRN    ondansetron (ZOFRAN-ODT) disintegrating tablet 4 mg  4 mg Oral Q8H PRN    Or    ondansetron (ZOFRAN) injection 4 mg  4 mg IntraVENous Q6H PRN  polyethylene glycol (GLYCOLAX) packet 17 g  17 g Oral Daily PRN    enoxaparin (LOVENOX) injection 40 mg  40 mg SubCUTAneous Daily    atorvastatin (LIPITOR) tablet 20 mg  20 mg Oral Nightly     ______________________________________________________________________  EXPECTED LENGTH OF STAY: Unable to retrieve estimated LOS  ACTUAL LENGTH OF STAY:          1                 Lexandra Rettke, APRN - CNP

## 2022-09-13 NOTE — Plan of Care (Signed)
Problem: Physical Therapy - Adult  Goal: By Discharge: Performs mobility at highest level of function for planned discharge setting.  See evaluation for individualized goals.  Description: FUNCTIONAL STATUS PRIOR TO ADMISSION: Patient was modified independent using a rollator for functional mobility. Pt wears bilateral AFO's at baseline.     HOME SUPPORT PRIOR TO ADMISSION: The patient lived with niece and required assist for cooking, cleaning. Pt reported she is only alone ~30 minutes at a time.    Physical Therapy Goals  Initiated 09/13/2022  1.  Patient will move from supine to sit and sit to supine and roll side to side in bed with modified independence within 7 day(s).    2.  Patient will perform sit to stand with modified independence within 7 day(s).  3.  Patient will transfer from bed to chair and chair to bed with modified independence using the least restrictive device within 7 day(s).  4.  Patient will ambulate with modified independence for 150 feet with the least restrictive device within 7 day(s).   5.  Patient will ascend/descend 2 stairs with bilateral handrail(s) with supervision/set-up within 7 day(s).  6.  Patient will improve Berg Balance score by 7 points within 7 days.    Outcome: Progressing   PHYSICAL THERAPY EVALUATION    Patient: Kimberly [name] (89 y.o. female)  Date: 09/13/2022  Primary Diagnosis: Numbness [R20.0]  Acute CVA (cerebrovascular accident) [facility]) [I63.9]       Precautions: Restrictions/Precautions: Fall Risk                      ASSESSMENT :   DEFICITS/IMPAIRMENTS:   The patient is limited by decreased balance, gait s/p admission on 2/7 with reports of R facial numbness and pt reported symptoms have now resolved. Head XW:[id number] for acute process. Brain MRI pending at time of eval.      Based on the impairments listed above pt demonstrated intact BLE strength, intact sensation, and coordination. Pt performed bed mobility with standby assist and additional time. Assisted pt in  donning AFOs while seated EOB. Pt performed transfers with RW with CGA-SBA and gait training x 50 ft with CGA-SBA. Pt with narrow BOS, decreased cadence, but no overt LOB or instability noted. Pt is likely close to functional baseline; however, will continue to benefit from acute PT services to progress mobility, improve balance and gait as well as prevent deconditioning. Recommend home with HHPT pending progress.    Patient will benefit from skilled intervention to address the above impairments.    Functional Outcome Measure:  The patient scored 33/56 on the berg balance outcome measure which is indicative of mod risk for falls.           PLAN :  Recommendations and Planned Interventions:   bed mobility training, transfer training, gait training, therapeutic exercises, neuromuscular re-education, patient and family training/education, and therapeutic activities    Frequency/Duration: Patient will be followed by physical therapy to address goals, PT Plan of Care: 5 times/week to address goals.    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."    OBJECTIVE DATA SUMMARY:  Past Medical History:   Diagnosis Date    Adverse effect of anesthesia     slow to wake    Anginal pain (HCC)     Arthritis     Breast cancer ([place]) 03/10/2018     left lumpectomy    Cancer ([place]) 01/2018    breast    Chronic pain     neuropathy    Constipation     GERD (gastroesophageal reflux disease)     Headache     Hearing loss     High cholesterol     Hypertension     Incontinence     Joint pain     Memory disorder     Muscle pain     Osteoporosis     Ringing in the ears     Snoring     Visual disturbance      Past Surgical History:    Procedure Laterality Date    BREAST LUMPECTOMY Left 03/10/2018    LEFT BREAST LUMPECTOMY WITH ULTRASOUND GUIDANCE performed by Luellen Pucker, MD at [place] (CERVIX STATUS UNKNOWN)      ORTHOPEDIC SURGERY      epidural pain mtg - pt unsure    OTHER SURGICAL HISTORY Right 10/15/2016    ligament correction right foot    OTHER SURGICAL HISTORY  11/26/2016    Pituitary tumor removal    US BREAST BIOPSY W LOC DEVICE 1ST LESION LEFT Left 01/21/2018    US BREAST NEEDLE BIOPSY LEFT 01/21/2018 [facility] [place]       Home Situation:  Social/Functional History  Lives With: Family  Type of Home: House  Home Layout: One level  Home Access: Stairs to enter with rails  Entrance Stairs - Number of Steps: 2  Entrance Stairs - Rails: Both  Home Equipment: Cane, Rollator  Has the patient had two or more falls in the past year or any fall with injury in the past year?: Yes [place]: Family  Ambulation Assistance: Independent  Transfer Assistance: Independent  Active Driver: No    Cognitive/Behavioral Status:  Orientation  Overall Orientation Status: Within Normal Limits  Orientation Level: Oriented X4       Strength:    Strength: Within functional limits    Tone & Sensation:   Tone: Normal  Sensation: Intact    Coordination:  Coordination: Within functional limits    Range Of Motion:  AROM: Within functional limits       Functional Mobility:  Bed Mobility:     Bed Mobility Training  Bed Mobility Training: Yes  Supine to Sit: Supervision  Scooting: Supervision  Transfers:     Pharmacologist: Yes  Sit to Stand: Contact-guard assistance  Stand to Sit: Stand-by assistance  Balance:               Balance  Sitting: Intact  Standing: Impaired  Standing - Static: Good  Standing - Dynamic: Good;Fair  Ambulation/Gait Training:                       Gait  Gait Training: Yes  Overall Level of Assistance: Contact-guard assistance;Stand-by assistance  Distance (ft): 50  Feet  Assistive Device: Gait belt;Walker, rolling (bilateral AFO's+tennis shoes)  Base of Support: Narrowed  Speed/Cadence: Pace decreased (< 100 feet/min)  Step Length: Right shortened;Left shortened  Gait Abnormalities: Decreased step clearance  Intervention/Education specific to: "Stroke diagnoses"    Patient was educated regarding her deficit(s) of R sided face numbness (resolved) as this relates to her diagnosis of CVA r/o.  She demonstrated fair  understanding    Patient and/or family was verbally educated on the BE FAST acronym for signs/symptoms of CVA and TIA. BE FAST was written on patient's communication board for visual education and reinforcement. All questions answered with patient indicating fair understanding.     Berg Balance Test:    Merrilee Jansky Balance Scale  1. Sitting to Standing: Able to stand independently using hands  2. Standing Unsupported: Able to stand 2 minutes with supervision  3. Sitting with Back Unsupported but Feet Supported on Floor or on a Stool: Able to sit safely and securely for 2 minutes  4. Standing to Sitting: Sits safely with minimal use of hands  5.  Transfers: Needs one person to assist  6. Standing Unsupported with Eyes Closed: Able to stand 10 seconds with supervision  7. Standing Unsupported with Feet Together: Able to place feet together independently but unable to hold for 30 seconds  8. Reach Forward with Outstretched Arm While Standing: Can reach forward 12 cm (5 inches)  9. Pick Up Object from Floor from a Standing Position: Unable to pick up and needs supervision while trying  10. Turning to Look Behind Over Left and Right Shoulders While Standing: Looks behind from both sides and weight shifts well  11. Turn 360 Degrees: Able to turn 360 degrees  safely in 4 seconds or less  12. Place Alternate Foot on Step or Stool While Standing Unsupported: Able to complete greater than 2 steps needs minimal assist  13. Standing Unsupported One Foot in Front: Loses balance while stepping or standing  14. Standing on One Leg: Unable to try needs assist to prevent fall  Berg Balance Score: 33         56=Maximum possible score;   0-20=High fall risk  21-40=Moderate fall risk   41-56=Low fall risk                                                                                                                                                                                                                                     Pain Rating:  0/10   Pain Intervention(s):       Activity Tolerance:   Good    After treatment:   Patient  left in no apparent distress sitting up in chair, Call bell within reach, Bed/ chair alarm activated, Caregiver / family present, and Side rails x3    COMMUNICATION/EDUCATION:   The patient's plan of care was discussed with: occupational therapist and registered nurse    Patient Education  Education Given To: Patient  Education Provided: Role of Therapy;Plan of Care  Education Method: Verbal  Barriers to Learning: None  Education Outcome: Verbalized understanding    Thank you for this referral.  Shea Evans, PT, DPT  Minutes: 50

## 2022-09-13 NOTE — Telephone Encounter (Signed)
Rollene Fare patient's niece can be reached at [phone]. Patient is in [facility],  for possible stroke.  Rollene Fare wants to discuss patient's medications because patient's medications are being changed around and she is concerned.

## 2022-09-13 NOTE — Telephone Encounter (Signed)
Returned call to pt, spoke with niece. Just informing provider that pt is currently in hospital for numbness to face after started new medication.   No assist needed.  Will schedule follow up once pt discharged

## 2022-09-13 NOTE — Telephone Encounter (Signed)
Rollene Fare, patients niece called to inform us that patient is in the [facility] in room [phone], but she was in the hospital all day yesterday because she stated the right side of her face was numb, and they did some CT scans.     Rollene Fare states the doctors at the hospital also want to change her medications, but she doesn't want them making any changes without talking to Dr. Tamala Julian first. Please give Rollene Fare a call to follow up on this at [phone].

## 2022-09-14 LAB — EKG 12-LEAD
Atrial Rate: 70 {beats}/min
Atrial Rate: 70 {beats}/min
Diagnosis: NORMAL
Diagnosis: NORMAL
P Axis: 42 degrees
P Axis: 42 degrees
P-R Interval: 150 ms
P-R Interval: 154 ms
Q-T Interval: 402 ms
Q-T Interval: 404 ms
QRS Duration: 88 ms
QRS Duration: 94 ms
QTc Calculation (Bazett): 434 ms
QTc Calculation (Bazett): 436 ms
R Axis: 10 degrees
R Axis: 13 degrees
T Axis: 124 degrees
T Axis: 125 degrees
Ventricular Rate: 70 {beats}/min
Ventricular Rate: 70 {beats}/min

## 2022-09-14 MED FILL — ROPINIROLE HCL 0.25 MG PO TABS: 0.25 MG | ORAL | Qty: 1

## 2022-09-14 MED FILL — LYRICA 100 MG PO CAPS: 100 MG | ORAL | Qty: 2

## 2022-09-14 MED FILL — LIPITOR 20 MG PO TABS: 20 MG | ORAL | Qty: 1

## 2022-09-14 MED FILL — VALSARTAN 40 MG PO TABS: 40 MG | ORAL | Qty: 1

## 2022-09-14 MED FILL — ENOXAPARIN SODIUM 40 MG/0.4ML IJ SOSY: 40 MG/0.4ML | INTRAMUSCULAR | Qty: 0.4

## 2022-09-14 MED FILL — CHILDRENS ASPIRIN 81 MG PO CHEW: 81 MG | ORAL | Qty: 1

## 2022-09-14 NOTE — Telephone Encounter (Signed)
Last appointment: 09/06/22  Next appointment: 12/05/22  Previous refill encounter(s): 06/20/22 #90    Requested Prescriptions     Pending Prescriptions Disp Refills    rOPINIRole (REQUIP) 0.25 MG tablet [Pharmacy Med Name: ROPINIROLE 0.'25MG'$  TABLETS] 90 tablet 1     Sig: TAKE 1 TABLET BY MOUTH EVERY NIGHT         For Pharmacy Admin Tracking Only    Program: Medication Refill  CPA in place:    Recommendation Provided To:   Intervention Detail: New Rx: 1, reason: Patient Preference  Intervention Accepted By:   Jamse Arn Closed?:    Time Spent (min): 5

## 2022-09-14 NOTE — Discharge Instructions (Addendum)
Discharge Instructions       PATIENT ID: SRITHA CHAUNCEY  MRN: [id number]   DATE OF BIRTH: [birth year]    DATE OF ADMISSION: 09/12/2022   DATE OF DISCHARGE: 09/14/2022    PRIMARY CARE PROVIDER: Cathlyn Parsons     ATTENDING PHYSICIAN: Jaye Beagle, MD   DISCHARGING PROVIDER: Hilbert Corrigan, APRN - NP    To contact this individual call [phone] and ask the operator to page.   If unavailable ask to be transferred the Adult Hospitalist Department.    DISCHARGE DIAGNOSES right-sided facial numbness - CVA ruled out    CONSULTATIONS: Neurology    PROCEDURES/SURGERIES: * No surgery found *    PENDING TEST RESULTS:   At the time of discharge the following test results are still pending: none    FOLLOW UP APPOINTMENTS:    Follow-up Information       Follow up With Specialties Details [place]  Call for home health services  skilled nursing, physical therapy, occupational therapy [phone]    Cathlyn Parsons, MD Family Medicine Follow up Please call to schedule follow-up within 1 week. [address]   [place] [address]  [phone]      Shaune Pollack, MD Neurology Follow up Please continue follow-up as scheduled for MRI. [address]  [place]  [place] [address]  [phone]                ADDITIONAL CARE RECOMMENDATIONS:   You were admitted to the hospital with stroke-like symptoms. Your head CT and brain MRI did not show any evidence of stroke. You were seen by Neurology and your symptoms were felt to possibly be related to headaches.  Schedule follow-up appointments as listed above.  If you experience any stroke symptoms, you should be evaluated in the emergency department.  Continue to work with home health physical therapy and occupational therapy.    DIET: regular diet    ACTIVITY: activity as tolerated    WOUND CARE: none    EQUIPMENT needed: none      DISCHARGE MEDICATIONS:   See Medication Reconciliation Form    It is important that you take  the medication exactly as they are prescribed.   Keep your medication in the bottles provided by the pharmacist and keep a list of the medication names, dosages, and times to be taken in your wallet.   Do not take other medications without consulting your doctor.       NOTIFY YOUR PHYSICIAN FOR ANY OF THE FOLLOWING:   Fever over 101 degrees for 24 hours.   Chest pain, shortness of breath, fever, chills, nausea, vomiting, diarrhea, change in mentation, falling, weakness, bleeding. Severe pain or pain not relieved by medications.  Or, any other signs or symptoms that you may have questions about.      DISPOSITION:    Home With:  X OT X PT  HH  RN       SNF/Inpatient Rehab/LTAC    Independent/assisted living    Hospice    Other:     CDMP Checked:   Yes X     PROBLEM LIST Updated:  Yes X       Signed:   Hilbert Corrigan, APRN - NP  09/14/2022  12:18 PM

## 2022-09-14 NOTE — Plan of Care (Signed)
Problem: Discharge Planning  Goal: Discharge to home or other facility with appropriate resources  09/14/2022 0012 by Marilynn Latino, RN  Outcome: Progressing  09/13/2022 1103 by Helaine Chess, RN  Outcome: Progressing  Flowsheets (Taken 09/13/2022 0800)  Discharge to home or other facility with appropriate resources: Identify barriers to discharge with patient and caregiver     Problem: Safety - Adult  Goal: Free from fall injury  09/14/2022 0012 by Marilynn Latino, RN  Outcome: Progressing  09/13/2022 1103 by Helaine Chess, RN  Outcome: Progressing     Problem: Chronic Conditions and Co-morbidities  Goal: Patient's chronic conditions and co-morbidity symptoms are monitored and maintained or improved  09/13/2022 1103 by Helaine Chess, RN  Outcome: Progressing  Flowsheets (Taken 09/13/2022 0800)  Care Plan - Patient's Chronic Conditions and Co-Morbidity Symptoms are Monitored and Maintained or Improved: Monitor and assess patient's chronic conditions and comorbid symptoms for stability, deterioration, or improvement

## 2022-09-14 NOTE — Progress Notes (Signed)
Bedside RN performed patient education and medication education. Discharge concerns initiated and discussed with patient, including clarification on "who" assists the patient at their home and instructions for when the home going patient should call their provider after discharge. Opportunity for questions and clarification was provided.      Patient receptive to education:Yes  Patient stated: "I will call and follow up with my PCP"  Barriers to Education: none  Diagnosis Education given:  Yes    Length of stay: 2  Expected Day of Discharge: 2  Ask if they have "Help at Home" & add to white board?  Yes    Education Day #: 2    Medication Education Given:  No  M in the box Medication name: n/a    Pt aware of HCAHPS survey: Yes          Stroke Education documented in Patient Education: Yes  Core Measures Documented in Connect Care:  Risk Factors: Yes  Warning signs of stroke: Yes  When to Activate 911: Yes  Medication Education for Risk Factors: Yes  Smoking cessation if applicable: Yes  Written Education Given:  Yes    Discharge NIH Completed: Yes  Score: 0    BRAINS: No    Follow Up Appointment Made: No  Date/Time if applicable: n/a

## 2022-09-14 NOTE — Progress Notes (Signed)
Physician Progress Note      [name], Kimberly [name]  CSN #:                  [id number]  DOB:                       12-30-1923  ADMIT DATE:       09/12/2022 2:37 PM  [place] DATE:  RESPONDING  PROVIDER #:        Jaye Beagle MD          QUERY TEXT:    Pt admitted with Facial numbness. Pt noted to have CVA ruled out. If possible,   please document in progress notes and discharge summary the etiology.    The medical record reflects the following:  Risk Factors: pmhx HTN, Dementia    Clinical Indicators: right-sided facial numbness, b//p 176/82, -MRI, CT head,   CTA head/neck without acute abnormality,    Treatment: CT Head, MRI,  -Neurology consulted,  -PT/OT/SLR  -asa, atorvastatin  Options provided:  -- Facial numbness due to, please specify etiology.  -- Other - I will add my own diagnosis  -- Disagree - Not applicable / Not valid  -- Disagree - Clinically unable to determine / Unknown  -- Refer to Clinical Documentation Reviewer    PROVIDER RESPONSE TEXT:    This patient has Facial numbness due to unclear etiology / dementia    Query created by: Arlester Marker on 09/14/2022 9:45 AM      Electronically signed by:  Jaye Beagle MD 09/14/2022 2:26 PM

## 2022-09-14 NOTE — Discharge Summary (Signed)
Discharge Summary       PATIENT ID: Kimberly [name]  MRN: [id number]   DATE OF BIRTH: 08/16/1923    DATE OF ADMISSION: 09/12/2022  2:37 PM    DATE OF DISCHARGE: 09/14/2022   PRIMARY CARE PROVIDER: Cathlyn Parsons, MD     ATTENDING PHYSICIAN: Dr. Lorene Dy  DISCHARGING PROVIDER: Hilbert Corrigan, APRN - NP    To contact this individual call [phone] and ask the operator to page.  If unavailable ask to be transferred the Adult Hospitalist Department.    CONSULTATIONS: IP CONSULT TO HOSPITALIST  IP CONSULT TO NEUROLOGY  IP CONSULT TO CASE MANAGEMENT    PROCEDURES/SURGERIES: * No surgery found *     ADMITTING DIAGNOSES & HOSPITAL COURSE:   This is a 89 year old woman with a past medical history significant for dementia, hypertension, and pituitary macroadenoma, presented at the emergency room with right-sided facial numbness. The patient's symptoms started at about 08:00 a.m. on Tuesday morning. The patient also complained of right arm pain. The right arm pain started about a week ago. The patient has been having a headache for the past couple of days. The patient was brought to the emergency room for further evaluation. The patient is not a very good historian because of her underlying dementia, but when the patient arrived at the emergency room, CT of the head without contrast was obtained. This was negative for acute pathology. CTA of the head and neck was then obtained. This did not show a large vessel occlusion, but did show a stable 14 x 13 mm pituitary mass consistent with macroadenoma. The patient is clearly outside TNK window. She was referred to the hospitalist service for admission for further evaluation for stroke. The patient denies prior history of TIA or CVA. There was no history of fever, rigors, or chills reported.     On 2/9, patient was medically stable for discharge home. Home health was arranged prior to discharge.    DISCHARGE DIAGNOSES / PLAN:      Right-sided facial numbness, now resolved  Rule out  CVA  - MRI, CT head, CTA head/neck without acute abnormality, pituitary tumor stable from 2017  - echo: EF 55-60%  - evaluated by Neurology, facial numbness possibly related to headache, less likely TIA  - PT/OT recommending home health, arranged prior to discharge  - continue ASA  - LDL 69.4 -- discussed with Neurology, no indication for continuation of statin on discharge  - A1c 5.3  - B6 and B12 elevated -- stop home B complex vitamins  - TSH 1.06      Dementia  - continue home aricept and cymbalta     HTN  - continue home valsartan     Neuropathy  RLS  - continue home lyrica and ropinirole       PENDING TEST RESULTS:   At the time of discharge the following test results are still pending: none    FOLLOW UP APPOINTMENTS:    Follow-up Information       Follow up With Specialties Details [facility] [place]  Call for home health services  skilled nursing, physical therapy, occupational therapy [phone]    Cathlyn Parsons, MD Family Medicine Follow up Please call to schedule follow-up within 1 week. [address]  [place] [address]  [phone]      Shaune Pollack, MD Neurology Follow up Please continue follow-up as scheduled for MRI. [address]  [place] [address]  [phone]              ADDITIONAL CARE RECOMMENDATIONS:   You were admitted to the hospital with stroke-like symptoms. Your head CT and brain MRI did not show any evidence of stroke. You were seen by Neurology and your symptoms were felt to possibly be related to headaches.  Schedule follow-up appointments as listed above.  If you experience any stroke symptoms, you should be evaluated in the emergency department.  Continue to work with home health physical therapy and occupational therapy.    DIET: regular diet    ACTIVITY: activity as tolerated    WOUND CARE: none    EQUIPMENT needed: none      DISCHARGE MEDICATIONS:     Medication List        CHANGE how you take these medications       * DULoxetine 60 MG extended release capsule  Commonly known as: CYMBALTA  Take 1 capsule by mouth daily (before dinner)  What changed: Another medication with the same name was added. Make sure you understand how and when to take each.     * DULoxetine 30 MG extended release capsule  Commonly known as: CYMBALTA  TAKE 1 CAPSULE BY MOUTH DAILY  What changed: You were already taking a medication with the same name, and this prescription was added. Make sure you understand how and when to take each.           * This list has 2 medication(s) that are the same as other medications prescribed for you. Read the directions carefully, and ask your doctor or other care provider to review them with you.                CONTINUE taking these medications      acetaminophen 500 MG tablet  Commonly known as: TYLENOL     acetaminophen-codeine 300-30 MG per tablet  Commonly known as: TYLENOL #3     ammonium lactate 12 % lotion  Commonly known as: LAC-HYDRIN     ascorbic acid 100 MG tablet  Commonly known as: VITAMIN C     aspirin 81 MG chewable tablet     donepezil 5 MG tablet  Commonly known as: Aricept  Take 1 tablet by mouth daily (before dinner)     pregabalin 200 MG capsule  Commonly known as: LYRICA  Take 1 capsule by mouth 2 times daily for 180 days. Max Daily Amount: 400 mg     rOPINIRole 0.25 MG tablet  Commonly known as: REQUIP  Take 1 tablet by mouth nightly     valsartan 40 MG tablet  Commonly known as: DIOVAN  Take 1 tablet by mouth daily     Vitamin D3 50 MCG (2000 UT) Caps            STOP taking these medications      b complex vitamins capsule               Where to Get Your Medications        These medications were sent to [facility], [phone]  [place], [place] [address]      Phone: [phone]   DULoxetine 30 MG extended release capsule           NOTIFY YOUR PHYSICIAN FOR ANY OF THE FOLLOWING:   Fever over 101  degrees for  24 hours.   Chest pain, shortness of breath, fever, chills, nausea, vomiting, diarrhea, change in mentation, falling, weakness, bleeding. Severe pain or pain not relieved by medications.  Or, any other signs or symptoms that you may have questions about.    DISPOSITION:   X Home With:  X OT X PT  HH  RN       Long term SNF/Inpatient Rehab    Independent/assisted living    Hospice    Other:       PATIENT CONDITION AT DISCHARGE:     Functional status    Poor    X Deconditioned     Independent      Cognition    X Lucid     Forgetful     Dementia      Catheters/lines (plus indication)    Foley     PICC     PEG    X None      Code status    X Full code     DNR      PHYSICAL EXAMINATION AT DISCHARGE:  Patient seen and examined, sitting in chair eating breakfast. Denies any numbness/tingling or weakness. No acute issues overnight. Patient's niece at bedside and updated on plan.    General : alert, awake, no acute distress, oriented X 4  HEENT: PEERL, EOMI, moist mucus membranes, HOH  Neck: supple, no JVD, no meningeal signs  Chest: Clear to auscultation bilaterally   CVS: S1 S2 heard, Capillary refill less than 2 seconds  Abd: soft/non tender, non distended, BS physiological  Ext: no clubbing, no cyanosis, no edema, brisk 2+ DP pulses  Neuro/Psych: pleasant mood and affect, CN 2-12 grossly intact, sensory grossly within normal limit, Strength 5/5 in all extremities  Skin: warm and dry     CHRONIC MEDICAL DIAGNOSES:  Principal Problem:    Acute CVA (cerebrovascular accident) ([place])  Active Problems:    Numbness    Stroke-like symptoms  Resolved Problems:    * No resolved hospital problems. *        Greater than 31 minutes were spent with the patient on counseling and coordination of care    Signed:   Hilbert Corrigan, APRN - NP  09/14/2022  12:18 PM

## 2022-09-14 NOTE — Care Coordination-Inpatient (Addendum)
Transition of Care:  home with home health SN/PT/OT; [facility] has accepted     Transport Plan: in car with family      Main contact: niece- Kimberly [name]  and nephew- Caroleen Hamman- [phone] and niece- Marlou Porch- [phone]    RUR: 15%    1010: this CM attempted to meet with patient to get [facility] choice; patient is sleeping; will follow up later this morning    1100: this CM met with patient and her niece at bedside to get [facility] choice they chose [facility]; sent referral via careport    1159: [facility] has accepted; info on the avs       09/14/22 1111   Condition of Participation: Discharge Planning   The Plan for Transition of Care is related to the following treatment goals: [facility]   The Patient and/or Patient Representative was provided with a Choice of Provider? Patient;Patient Representative   Name of the Patient Representative who was provided with the Choice of Provider and agrees with the Discharge Plan?  niece Kimberly [name]   The Patient and/Or Patient Representative agree with the Discharge Plan? Yes   Freedom of Choice list was provided with basic dialogue that supports the patient's individualized plan of care/goals, treatment preferences, and shares the quality data associated with the providers?  Yes       Prior Level of Functioning: lives in a home with family  Disposition: home with home health with [facility]  If SNF or IPR: Date FOC offered: n/a  Date FOC received: n/a  Accepting facility: n/a  Date authorization started with reference number: n/a  Date authorization received and expires: n/a  Follow up appointments: specialists/pcp  DME needed: none ordered  Transportation at discharge: in car with family  IM/IMM Medicare/Tricare letter given: 2nd one on 09/14/22  Is patient a Veteran and connected with VA? no   If yes, was [employer] transfer form completed and VA notified? N/a  Caregiver Contact: niece- Regina  Discharge Caregiver contacted prior to discharge? Yes- at bedside on  2/9  Care Conference needed? no  Barriers to discharge:  none    CM following   Deliah Goody, RN         NOTE:  pleasant patient at bedside; she is alert and oriented x 2-3; patient lives at stated address (one level home) with her niece Kimberly [name]; she has another supportive niece and nephew who assist; patient has a walker and cane and is moderate independent in her adls; she does not drive; she does not use home O2; pcp and insurance confirmed; this CM talked about recommendations for home health; patient is in agreement; she is reviewing the home health list and this time and will give this CM choice tomorrow

## 2022-09-14 NOTE — Plan of Care (Signed)
Problem: Discharge Planning  Goal: Discharge to home or other facility with appropriate resources  09/14/2022 1041 by Helaine Chess, RN  Outcome: Progressing  Flowsheets (Taken 09/14/2022 0800)  Discharge to home or other facility with appropriate resources: Identify barriers to discharge with patient and caregiver  09/14/2022 0012 by Marilynn Latino, RN  Outcome: Progressing     Problem: Safety - Adult  Goal: Free from fall injury  09/14/2022 1041 by Helaine Chess, RN  Outcome: Progressing  09/14/2022 0012 by Marilynn Latino, RN  Outcome: Progressing     Problem: Chronic Conditions and Co-morbidities  Goal: Patient's chronic conditions and co-morbidity symptoms are monitored and maintained or improved  Outcome: Progressing  Flowsheets (Taken 09/14/2022 0800)  Care Plan - Patient's Chronic Conditions and Co-Morbidity Symptoms are Monitored and Maintained or Improved: Monitor and assess patient's chronic conditions and comorbid symptoms for stability, deterioration, or improvement

## 2022-09-14 NOTE — Plan of Care (Signed)
Problem: Discharge Planning  Goal: Discharge to home or other facility with appropriate resources  09/14/2022 1323 by Helaine Chess, RN  Outcome: Adequate for Discharge  09/14/2022 1041 by Helaine Chess, RN  Outcome: Progressing  Flowsheets (Taken 09/14/2022 0800)  Discharge to home or other facility with appropriate resources: Identify barriers to discharge with patient and caregiver  09/14/2022 0012 by Marilynn Latino, RN  Outcome: Progressing     Problem: Safety - Adult  Goal: Free from fall injury  09/14/2022 1323 by Helaine Chess, RN  Outcome: Adequate for Discharge  09/14/2022 1041 by Helaine Chess, RN  Outcome: Progressing  09/14/2022 0012 by Marilynn Latino, RN  Outcome: Progressing     Problem: Chronic Conditions and Co-morbidities  Goal: Patient's chronic conditions and co-morbidity symptoms are monitored and maintained or improved  09/14/2022 1323 by Helaine Chess, RN  Outcome: Adequate for Discharge  09/14/2022 1041 by Helaine Chess, RN  Outcome: Progressing  Flowsheets (Taken 09/14/2022 0800)  Care Plan - Patient's Chronic Conditions and Co-Morbidity Symptoms are Monitored and Maintained or Improved: Monitor and assess patient's chronic conditions and comorbid symptoms for stability, deterioration, or improvement

## 2022-09-16 MED ORDER — ROPINIROLE HCL 0.25 MG PO TABS
0.25 MG | ORAL_TABLET | Freq: Every evening | ORAL | 1 refills | Status: AC
Start: 2022-09-16 — End: 2023-03-20

## 2022-09-17 ENCOUNTER — Encounter

## 2022-09-17 NOTE — Care Coordination-Inpatient (Signed)
Care Transitions Initial Follow Up Call    Call within 2 business days of discharge: Yes    Patient Current Location:  [facility] Transition Nurse contacted the patient and and patient's niece, Cherlyn Labella (on [place] dated 05-29-22),  by telephone to perform post hospital discharge assessment. Verified name and DOB with patient as identifiers. Provided introduction to self, and explanation of the Care Transition Nurse role.     Patient: Kimberly [name] Patient DOB: [birth year]   MRN: [id number]  Reason for Admission: CVA rule out   Discharge Date: 09/14/22 RARS: Readmission Risk Score: 14.5    Last Discharge Facility       Date Complaint Diagnosis Description Type Department Provider    09/12/22 Numbness Numbness ... ED to Hosp-Admission (Discharged) (ADMITTED) [id number] Jaye Beagle, MD; [place], Manus Gunning...        Was this an external facility discharge? No     Challenges to be reviewed by the provider   Additional needs identified to be addressed with provider: No  none, neither the patient or the niece have any red flags to report at this time.          Method of communication with provider: chart routing to PCP.     Component      Latest Ref Rng 09/06/2022 09/12/2022 09/13/2022   Hemoglobin Quant      11.5 - 16.0 g/dL 11.2 (L)  11.3 (L)  10.4 (L)    Hematocrit      35.0 - 47.0 % 37.4  35.6  34.0 (L)    MCV      80.0 - 99.0 FL 72.6 (L)  69.9 (L)  70.8 (L)    MCH      26.0 - 34.0 PG 21.7 (L)  22.2 (L)  21.7 (L)       Component      Latest Ref Rng 09/06/2022 09/12/2022 09/13/2022   RDW      11.5 - 14.5 % 16.4 (H)  16.8 (H)  16.1 (H)       Component      Latest Ref Rng 09/06/2022 09/12/2022 09/13/2022   Anion Gap      5 - 15 mmol/L 7  3 (L)  3 (L)       Component      Latest Ref Rng 09/06/2022 09/12/2022 09/13/2022   Albumin      3.5 - 5.0 g/dL 3.1 (L)  2.8 (L)  2.7 (L)    Globulin      2.0 - 4.0 g/dL 3.8  4.4 (H)  4.1 (H)    ALBUMIN/GLOBULIN RATIO      1.1 - 2.2   0.8 (L)  0.6 (L)  0.7 (L)       Component      Latest Ref Rng 09/13/2022    Vitamin B-12      193 - 986 pg/mL >2000 (H)    FOLATE, FOLAT      5.0 - 21.0 ng/mL 22.3 (H)    CRP      0.00 - 0.30 mg/dL 2.42 (H)       Care Transition Nurse reviewed discharge instructions, medical action plan, and red flags with patient and and niece  who verbalized understanding. The patient and and niece  were given an opportunity to ask questions and do not have any further questions or concerns at this time. Were discharge instructions available to patient/niece? Yes. Reviewed appropriate site of care based on symptoms and resources  available to patient including:   PCP  Specialist  Urgent care clinics  Home health  When to call [facility] .   The  niece  agrees to contact the PCP office for questions related to patient's healthcare.     Advance Care Planning:   Does patient have an Advance Directive: not on file; education provided regarding explanation of ACP document and the importance of having an ACP document on file, the niece verbalized an understanding.     Medication reconciliation was performed with  the niece , who verbalizes understanding of administration of home medications. Medications reviewed, 1111F entered: yes    Was patient discharged with a pulse oximeter? no    Non-face-to-face services provided:  Scheduled appointment with PCP-Care Transitions Nurse assisted patient in scheduling [facility] appointment with her PCP on 09-25-22.  Obtained and reviewed discharge summary and/or continuity of care documents  Education of patient/family/caregiver/guardian to support self-management-Education provided regarding signs/symptoms of CVA/TIA, the niece verbalized an understanding.     Offered patient enrollment in the Remote Patient Monitoring (RPM) program for in-home monitoring:  N/A at this time .    Care Transitions 24 Hour Call    Schedule Follow Up Appointment with PCP: Completed  Do you have a copy of your discharge instructions?: Yes  Do you have all of your prescriptions and are they  filled?: Yes  Have you been contacted by a [place]?: No  Have you scheduled your follow up appointment?: Yes  How are you going to get to your appointment?: Car - family or friend to transport  Do you have support at home?: Other Caregiver/niece  Do you feel like you have everything you need to keep you well at home?: Yes (Comment: Per niece, Cherlyn Labella.)  Are you an active caregiver in your home?: No    Discussed follow-up appointments. If no appointment was previously scheduled, appointment scheduling offered: Yes.   Is follow up appointment scheduled within 7 days of discharge? No.    [place] Follow Up:   Future Appointments   Date Time Provider [place]   09/18/2022  8:40 AM Shaune Pollack, MD [facility] BS AMB   09/19/2022  2:00 PM [facility]   09/25/2022  2:00 PM Cathlyn Parsons, MD LMCA BS AMB   11/28/2022  1:40 PM Luellen Pucker, MD [facility] BS AMB   12/05/2022 11:00 AM Cathlyn Parsons, MD LMCA BS AMB   03/20/2023 11:00 AM Shaune Pollack, MD [facility] BS AMB     Non-[facility] Follow Up:  None noted at this time.     Care Transition Nurse provided contact information.  Plan for follow-up call in 5-7 days based on severity of symptoms and risk factors.  Plan for next call: self management-Review red flags of CVA/TIA and,  follow-up appointment-Evaluate if patient is attending follow-up appointments as scheduled, offer assistance with scheduling as needed.      Goals        Conditions and Symptoms      I will schedule office visits, as directed by my provider.  I will keep my appointment or reschedule if I have to cancel.  I will notify my provider of any barriers to my plan of care.  I will notify my provider of any symptoms that indicate a worsening of my condition.    Barriers: none  Plan for overcoming my barriers: N/A  Confidence: 8/10  Anticipated Goal Completion Date: 10-15-22  09-17-22: Care Transitions Nurse (CTN) assisted patient in scheduling [facility] appointment with her PCP  on 09-25-22. Patient has neuro appointment scheduled with Dr. Danton Sewer on 09-18-22. Erenest Blank, RN

## 2022-09-17 NOTE — Telephone Encounter (Signed)
Jan ( transitional nurse ) [facility] hospital states patient need a hospital follow up appointment before 2/22 please call daughter Rollene Fare [phone]

## 2022-09-17 NOTE — Telephone Encounter (Signed)
Appt scheduled for 09/25/22

## 2022-09-18 ENCOUNTER — Ambulatory Visit: Admit: 2022-09-18 | Payer: MEDICARE | Attending: Neurology | Primary: Family Medicine

## 2022-09-18 ENCOUNTER — Inpatient Hospital Stay: Admit: 2022-09-18 | Payer: MEDICARE | Primary: Family Medicine

## 2022-09-18 DIAGNOSIS — I6789 Other cerebrovascular disease: Secondary | ICD-10-CM

## 2022-09-18 DIAGNOSIS — M25531 Pain in right wrist: Secondary | ICD-10-CM

## 2022-09-18 MED ORDER — PREGABALIN 200 MG PO CAPS
200 | ORAL_CAPSULE | Freq: Two times a day (BID) | ORAL | 5 refills | Status: DC
Start: 2022-09-18 — End: 2023-03-19

## 2022-09-18 NOTE — Progress Notes (Signed)
Consult  REFERRED BY:  Cathlyn Parsons, MD    CHIEF COMPLAINT: Patient seen on urgent work in basis after she was admitted to the hospital for possible stroke 1 week ago.    Subjective:     Kimberly [name] is a 89 y.o. right-handed African-American female we are seeing at the request of Dr. 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, and she was evaluated for the right facial numbness, but the right facial numbness is gotten better, and has resolved, but she still complains of numbness and pain neuropathy in her right arm and wrist, and hand, and just moving her wrist seems to bring on the pain, and x-ray of the hand relative unremarkable but the wrist did show some mild arthritis, and she definitely has significant cervical disease with severe neuroforaminal disease at several levels, and because of that and the possibility of carpal tunnel syndrome, we will check an EMG of her right arm, did the x-rays as above, and she is going to get physical therapy at home and we advised the daughter to make sure that they do therapy on her right wrist and hand.  Her neuropathic pain has gotten better on the increased Cymbalta.  She was previously seen for pain in hands and feet that is better on the Lyrica 200 mg 3 times a day which she is tolerating well without side effect, and new problem of memory loss is getting worse also.  After being seen in the ER last night for severe burning pain in her hands and feet that she has had for at least 8 or 9 years, and seems to be getting worse and worse.  Her EMG did document neuropathy, and her workup for metabolic studies was negative for any treatable causes to see.  Her MRI of the  cervical spine just showed moderate severe arthritis, but no real surgical disease at her age.  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  She is encouraged take her multivitamins and vitamin D every day remain mentally and physically active.  She is going to get an MRI of the lumbar spine done in the near future to further evaluate this neuropathy.  Her EMG study did document the neuropathy that was moderate to severe.  She been followed by several neurologist in the past, but they have left the practice.  She was diagnosed with neuropathy, but no real EMG test or metabolic screens were ever done as best I can tell, the cause of her neuropathy is completely unclear.  She is taking Lyrica 200 mg twice a day with some improvement, but seems to be breaking through.  She has bilateral foot drop of unclear etiology, and I guess this may be related to the neuropathy.  She also has a history of previous surgery about 6 or 7 years ago for pituitary macroadenoma, but on a recent MRI scan done 2 years ago she had recurrence of the tumor with slight enlargement, but never had another scan done and no mention of this is ever made in  the notes.  She has a past history of breast cancer, previous lumpectomy, chronic neuropathic pain, hyperlipidemia, hypertension, but apparently is not a diabetic.  She has a pituitary tumor also.  She has had back problems and received pain management and epidural steroids from previous pain management doctors.  She takes Lidoderm patches as recommended by Dr. Meriam Sprague.  She has tried topical pain relievers in addition also of multiple types.  She has had syncope in the past and memory loss, and probably has a mild dementia at her age.  She has significant arthritis in her neck and back.  She has a very complicated history and all of her past history was reviewed  in detail and previous MRI scans, and her neurology notes and medical notes, but no neurosurgery notes are noted, and she thinks that may be because she thinks she might have been operated on at [facility], she really cannot remember due to her memory problems.  Despite the enlarging pituitary macroadenoma, she does not complain of headaches or new visual symptoms now, but her vision does seem to be impaired  Her memory seems more recent memory, and her family had some memory problems in the several members, they got better with Aricept and the daughter would like that, we will give her some Aricept for memory loss and see how she does and that may make her more active, she does tend to sleep a bit.  On her Mini-Mental status examination she does appear to have a mild dementia.    Past Medical History:   Diagnosis Date    Adverse effect of anesthesia     slow to wake    Anginal pain (HCC)     Arthritis     Breast cancer ([place]) 03/10/2018     left lumpectomy    Cancer ([place]) 01/2018    breast    Chronic pain     neuropathy    Constipation     GERD (gastroesophageal reflux disease)     Headache     Hearing loss     High cholesterol     Hypertension     Incontinence     Joint pain     Memory disorder     Muscle pain     Osteoporosis     Ringing in the ears     Snoring     Visual disturbance       Past Surgical History:   Procedure Laterality Date    BREAST LUMPECTOMY Left 03/10/2018    LEFT BREAST LUMPECTOMY WITH ULTRASOUND GUIDANCE performed by Luellen Pucker, MD at [facility]    HYSTERECTOMY (CERVIX STATUS UNKNOWN)      ORTHOPEDIC SURGERY      epidural pain mtg - pt unsure    OTHER SURGICAL HISTORY Right 10/15/2016    ligament correction right foot    OTHER SURGICAL HISTORY  11/26/2016    Pituitary tumor removal    US BREAST BIOPSY W LOC DEVICE 1ST LESION LEFT Left 01/21/2018    US BREAST NEEDLE BIOPSY LEFT 01/21/2018 [facility] [place]     Family History   Problem Relation Age of Onset    Dementia  Sister     Dementia Brother     Cancer Daughter     Breast Cancer Daughter 40    Breast Cancer Sister 35    Breast Cancer Mother 51    No Known Problems Father       Social History  Tobacco Use    Smoking status: Never    Smokeless tobacco: Never   Substance Use Topics    Alcohol use: Not Currently         Current Outpatient Medications:     pregabalin (LYRICA) 200 MG capsule, Take 1 capsule by mouth 2 times daily for 180 days. Max Daily Amount: 400 mg, Disp: 60 capsule, Rfl: 5    rOPINIRole (REQUIP) 0.25 MG tablet, TAKE 1 TABLET BY MOUTH EVERY NIGHT, Disp: 90 tablet, Rfl: 1    DULoxetine (CYMBALTA) 30 MG extended release capsule, TAKE 1 CAPSULE BY MOUTH DAILY (Patient taking differently: Take 3 capsules by mouth daily), Disp: 90 capsule, Rfl: 3    valsartan (DIOVAN) 40 MG tablet, Take 1 tablet by mouth daily, Disp: 90 tablet, Rfl: 0    acetaminophen-codeine (TYLENOL #3) 300-30 MG per tablet, , Disp: , Rfl:     donepezil (ARICEPT) 5 MG tablet, Take 1 tablet by mouth daily (before dinner), Disp: 30 tablet, Rfl: 11    DULoxetine (CYMBALTA) 60 MG extended release capsule, Take 1 capsule by mouth daily (before dinner) (Patient taking differently: Take 90 mg by mouth daily (before dinner)), Disp: 90 capsule, Rfl: 3    Cholecalciferol (VITAMIN D3) 50 MCG (2000 UT) CAPS, Take by mouth, Disp: , Rfl:     ammonium lactate (LAC-HYDRIN) 12 % lotion, Apply 1 Application topically 2 times daily as needed, Disp: , Rfl:     ascorbic acid (VITAMIN C) 100 MG tablet, Take 1 tablet by mouth daily as needed, Disp: , Rfl:     acetaminophen (TYLENOL) 500 MG tablet, Take by mouth every 6 hours as needed, Disp: , Rfl:     aspirin 81 MG chewable tablet, Take by mouth daily, Disp: , Rfl:         Allergies   Allergen Reactions    Penicillins Hives and Myalgia    Sulfa Antibiotics      Other reaction(s): Unknown (comments)      MRI Results (most recent):  @BSHSILASTIMGCAT$ [id number]    @BSHSILASTIMGCAT$ [id number]  Review of Systems:  A  comprehensive review of systems was negative except for: Constitutional: positive for fatigue and malaise  Musculoskeletal: positive for arthralgias, back pain, muscle weakness, myalgias, neck pain, and stiff joints  Neurological: positive for coordination problems, gait problems, memory problems, paresthesia, and weakness  Behvioral/Psych: positive for anxiety, depression, and dementia    Vitals:    09/18/22 0844 09/18/22 0937   BP: (!) 170/80 138/80   Site: Right Upper Arm    Position: Sitting    Cuff Size: Medium Adult    Pulse: 82    Resp: 18    Temp: 97.6 F (36.4 C)    TempSrc: Temporal    SpO2: 98%    Weight: 66.1 kg (145 lb 12.8 oz)    Height: 1.575 m (5' 2"$ )      Objective:     I      NEUROLOGICAL EXAM:    Appearance:  The patient is poorly developed, well nourished, provides a poor history and is in no acute distress.   Mental Status: Oriented to place and person, and the president, but not the date, and she is a somewhat poor historian, and cognitive function is mildly abnormal and speech is fluent and no aphasia or dysarthria. Mood and affect appropriate.   Cranial Nerves:   Intact visual fields. Fundi are poorly seen. PERLA, EOM's full, no nystagmus, no ptosis. Facial sensation is normal. Corneal reflexes are not  tested. Facial movement is asymmetric. Hearing is normal bilaterally. Palate is midline with normal sternocleidomastoid and trapezius muscles are normal. Tongue is midline.  Neck without meningismus or bruits  Temporal arteries are not tender or enlarged  TMJ areas are not tender on palpation   Motor:  3-4/5 strength in upper and lower proximal and distal muscles except that she has severe foot drop and distal weakness in both legs and has to wear bilateral AFOs, possibly due to neuropathy or lumbar radiculopathy.  Reduced bulk and tone. No fasciculations.  Rapid alternating movement is symmetric and slow bilaterally   Reflexes:   Deep tendon reflexes 2+/4 and symmetrical.  No babinski or  clonus present   Sensory:   Abnormal to touch, pinprick and vibration and temperature to about knee level.  DSS is intact   Gait:  Abnormal gait for patient's age as she has to use a walker and bilateral AFO braces.   Tremor:   No tremor noted.   Cerebellar:  Moderately abnormal cerebellar signs present on Romberg and tandem testing and finger-nose-finger exam.   Neurovascular:  Normal heart sounds and regular rhythm, peripheral pulses decreased, and no carotid bruits.           Assessment:      Diagnosis Orders   1. Cerebral microvascular disease        2. Pituitary macroadenoma with extrasellar extension ([place])        3. Acute alteration in mental status        4. Cervical radiculopathy due to degenerative joint disease of spine  EMG LIMITED    pregabalin (LYRICA) 200 MG capsule      5. Degenerative cervical spinal stenosis        6. Diabetic peripheral neuropathy associated with type 2 diabetes mellitus (HCC)  pregabalin (LYRICA) 200 MG capsule      7. Lumbosacral radiculopathy due to degenerative joint disease of spine  pregabalin (LYRICA) 200 MG capsule      8. Memory loss        9. Tension vascular headache  pregabalin (LYRICA) 200 MG capsule      10. 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 Bilateral carotid artery stenosis        13. Chronic pain of right wrist  XR WRIST RIGHT (MIN 3 VIEWS)    XR HAND RIGHT (MIN 3 VIEWS)    EMG LIMITED      14. Chronic hand pain, right  XR WRIST RIGHT (MIN 3 VIEWS)    XR HAND RIGHT (MIN 3 VIEWS)    EMG LIMITED      15. Polyneuropathy, unspecified  pregabalin (LYRICA) 200 MG capsule        Active Problems:    * No active hospital problems. *  Resolved Problems:    * No resolved hospital problems. *      Plan:     Patient with new problem of possible stroke because of right-sided weakness and numbness, mainly in the face, but her workup was negative with a negative CTA showing no  large vessel occlusion, and her MRI scan showed no acute stroke, just a stable pituitary macroadenoma, but she still complaining of right hand pain and right leg pain, and because of that reason we will check an EMG of her right arm, rule out carpal tunnel syndrome rule out cervical  radiculopathy, she has previous MRI of the neck that shows severe neuroforaminal disease, and moderate stenosis, and because of pain just moving her wrist, she seems to have some arthritis there on x-ray we did today and of her right hand, and she is to get physical therapy for that.  Patient with complicated problem of severe burning pain in her hands and feet, my concern is this may be partly related to her cervical disease which was described as moderate to severe, on MRI of the brain, and on her EMG she did have moderate neuropathy, and her panels did not show any treatable cause, to idiopathic, although can do was treated at her age, we will continue to do that and see how she does, and she is doing better on the increased dose of Cymbalta was given to her in the hospital last week, they added another 30 mg for the 60 mg she is taking already.  MRI of the cervical spine did show moderate disease but no surgical disease to get her age, so we can do is possible that and try to keep her comfortable.  MRI of the lumbar spine is pending at this time.  MRI of the brain did show the macroadenoma of the pituitary gland stable, not enlarging or enhancing, we will continue to follow that closely.    We discussed this in detail with the patient and her daughter, and we will try her on Cymbalta to try to help control the pain until we get an answer for her problems, and try to hold off on narcotics at this time, and may have to advance the Lyrica to 200 mg 3 times a day but at her age I am a little leery of that.  She is encouraged to take a multivitamin and vitamin D every day, stay physically mentally active, try to exercise some every day,  and try some physical therapy but the patient says no she does not want the therapy she says she can just do her own exercises at home.  Memory is getting worse, we will give her Aricept, I do not think she needs neuropsych testing at her age, and other family members have responded to Aricept and the daughter really would like to try that.  45 minutes minutes spent reviewing her records in detail on the chart, going over all her MRI scans, reviewing all of her previous neurology notes and work-ups from her recent hospitalization and neurology consults and showed the daughter the findings so she will understand also,, and trying to decipher what kind of neuropathy she does have and what test had been done and what needs to be done.  We encouraged him to try to eat a Mediterranean type diet in addition to take the vitamins as above.  We will see her again in 3 months time we will check MyChart for results of her test, or call us for results, and this is a very difficult case with multiple problems, which could be serious and life-threatening if she has a pituitary tumor or has falls or progressive weakness and may be even possibly neuromuscular disease which could be fatal we will get an EMG study to evaluate all this.      Signed By: Danton Sewer, MD     September 18, 2022       CC: Cathlyn Parsons, MD  FAX: [phone]

## 2022-09-19 ENCOUNTER — Inpatient Hospital Stay: Admit: 2022-09-19 | Payer: MEDICARE | Attending: Neurology | Primary: Family Medicine

## 2022-09-19 DIAGNOSIS — M4727 Other spondylosis with radiculopathy, lumbosacral region: Secondary | ICD-10-CM

## 2022-09-20 ENCOUNTER — Encounter

## 2022-09-20 NOTE — Progress Notes (Signed)
I called the patient, the daughter voicemail was on the phone, so I left them a message that we are referring her to Dr. Brayton Caves for pain management to see if they can help her, if she is not a surgical candidate at age 89

## 2022-09-21 NOTE — Progress Notes (Signed)
Faxed physical medicine rehab referral to Dr. Brayton Caves at [phone] with last office visit note, patient demographics and insurance card information. Received fax confirmation.

## 2022-09-25 ENCOUNTER — Ambulatory Visit: Payer: MEDICARE | Attending: Family Medicine | Primary: Family Medicine

## 2022-09-26 NOTE — Progress Notes (Signed)
Kimberly [name] [phone] faxed to [phone]

## 2022-09-26 NOTE — Care Coordination-Inpatient (Signed)
Care Transitions Follow Up Call    Patient Current Location:  [facility] Transition Nurse contacted the  patient's niece, Cherlyn Labella (on [place] dated 05-29-22),  by telephone to follow up after admission on 09-12-22.  Verified name and DOB with  the niece  as identifiers.    Patient: Kimberly [name]  Patient DOB: [birth year]   MRN: [id number]  Reason for Admission:  CVA rule out   Discharge Date: 09/14/22 RARS: Readmission Risk Score: 14.5    Needs to be reviewed by the provider   Additional needs identified to be addressed with provider: No  none, patient's niece has no red flags to report at this time.          Method of communication with provider: none.    Discussed follow-up appointments. If no appointment was previously scheduled, appointment scheduling offered: N/A, patient was marked as 'No Show' for her [facility] appointment with her PCP on 09-25-22, and patient's niece, Cherlyn Labella (on [place] dated 05-29-22), states she and the patient drove to the incorrect office location and then missed the 15 minute late window to be seen for [facility] appointment on 09-25-22.  Patient's next PCP follow-up appointment is currently scheduled on 11-30-22; however the niece states patient is on cancellation list for a sooner PCP appointment.   Was follow up appointment scheduled within 7 days of discharge? Yes, patient attended neuro appointment with Dr. Danton Sewer on 09-18-22 as scheduled. Marland Kitchen    [place] Follow Up:   Future Appointments   Date Time Provider [place]   10/24/2022  3:00 PM [facility] NEUMRSP[facility] BS AMB   11/28/2022  1:40 PM Luellen Pucker, MD [facility] BS AMB   11/30/2022  3:20 PM Doyce Para Lajuana Carry, MD [facility] BS AMB   12/05/2022 11:00 AM Cathlyn Parsons, MD [facility] BS AMB   03/20/2023 11:00 AM Shaune Pollack, MD [facility] BS AMB     External follow up appointment(s): Patient has ortho appointment scheduled with Jeanelle Malling on 10-04-22.     Care Transition Nurse reviewed medical action plan and red flags  with  the niece  and discussed any barriers to care and/or understanding of plan of care after discharge. Discussed appropriate site of care based on symptoms and resources available to patient including:   PCP  Specialist  Urgent care clinics  Home health  When to call [place] .   The  niece  agrees to contact the PCP office for questions related to patient's healthcare.     Interventions to address risk factors: Education of patient/family/caregiver/guardian to support self-management-Education provided regarding signs/symptoms of CVA/TIA, the niece verbalized an understanding.     Offered patient enrollment in the Remote Patient Monitoring (RPM) program for in-home monitoring:  N/A at this time .     Care Transition Nurse provided contact information for future needs. Plan for follow-up call in 7-10 days based on severity of symptoms and risk factors.  Plan for next call: self management-Review red flags of CVA/TIA and,  follow-up appointment-Evaluate if patient continues to attend follow-up appointments as scheduled, offer assistance with scheduling as needed.      Goals        Conditions and Symptoms      I will schedule office visits, as directed by my provider.  I will keep my appointment or reschedule if I have to cancel.  I will notify my provider of any barriers to my plan of care.  I will notify my provider of any symptoms that indicate a worsening of my condition.    Barriers: none  Plan for overcoming my barriers: N/A  Confidence: 8/10  Anticipated Goal Completion Date: 10-15-22    09-17-22: Care Transitions Nurse (CTN) assisted patient in scheduling [facility] appointment with her PCP on 09-25-22. Patient has neuro appointment scheduled with Dr. Danton Sewer on 09-18-22. JB    09-26-22: Patient was marked 'No Show' for her [facility] appointment that was scheduled on 09-25-22, and patient's niece, Cherlyn Labella (on [place] dated 05-29-22), states she and the patient drove to the incorrect office location and  then missed the 15 minute late window to be seen for [facility] appointment on 09-25-22.  Patient's next PCP follow-up appointment is currently scheduled on 11-30-22; however the niece states patient is on cancellation list for a sooner PCP appointment. Patient attended neuro appointment with Dr. Danton Sewer on 09-18-22 as scheduled. Patient has ortho appointment scheduled with Jeanelle Malling on 10-04-22. Erenest Blank, RN

## 2022-10-01 NOTE — Telephone Encounter (Signed)
Pregabalin (generic Lyrica)     CoverMyMeds has predicted that this prior auth will not be required.

## 2022-10-02 NOTE — Telephone Encounter (Signed)
Patient niece Kimberly [name] would like for Dr.Enow to know patient has been waking up in more pain regularly she is giving her tylenol 3 to help does she need to be seen or can Dr.Enow give her a call @ [phone]

## 2022-10-03 NOTE — Progress Notes (Signed)
Faxed to [facility] order [id number]  [phone]

## 2022-10-03 NOTE — Telephone Encounter (Signed)
Niece Mendel Ryder) stated that she has increased pain with PT coming in to increase her activity. Her Niece stated that the Tylenol #3 and the Lyrica given by Dr. Tamala Julian 09/18/2022 doesn't seem to helping like it was and wanted to know if she can do to help with pain after having PT.    [place] has concerns with her BP elevated 2/28/204 168/84. They are wanting to know if her BP needed to be adjusted.

## 2022-10-04 NOTE — Telephone Encounter (Signed)
Ramonia ( Occu.There. ) with [facility] would like for Dr.Enow to know patient missed her therapy appointment today due to her having a doctor appt. Ramonia will see her next week

## 2022-10-04 NOTE — Progress Notes (Signed)
Subjective:     Patient ID: Kimberly [name] is a 89 y.o. female.    Chief Complaint: Pain of the Lower Back    HPI:  Kimberly [name] is a 89 y.o. female with complaints of low back pain. PMH dementia, HTN, pituitary macroadenoma.  The pain has been present for many years, but has gradually increased. It is in the low back and radiates across the belt line and into the buttocks.  It is described as dull, aching, sometimes sharp, and is there daily.  It is worse with sitting to standing, in the morning and better with rest.  Kimberly [name] has tried cymbalta, lyrica, tylenol #3. She has been doing PT and HEP.  The pain is rated 6 out of 10 on the VAS.       Patient Active Problem List    Diagnosis Date Noted   . High cholesterol 10/04/2022   . Hypertension, essential, benign 10/04/2022   . Acute CVA (cerebrovascular accident) 09/12/2022   . Cervical radiculopathy due to degenerative joint disease of spine 08/21/2022   . Dementia of the Alzheimer's type with late onset without behavioral disturbance 08/21/2022   . Lumbosacral radiculopathy due to degenerative joint disease of spine 07/25/2022   . Degenerative cervical spinal stenosis 05/29/2022   . Diabetic peripheral neuropathy associated with type 2 diabetes mellitus 05/29/2022   . Idiopathic small fiber peripheral neuropathy 05/29/2022   . Chronic renal disease, stage III 02/13/2021   . PVD (peripheral vascular disease) 01/18/2021   . Bilateral carotid artery stenosis 07/11/2020   . Cerebral microvascular disease 07/11/2020   . History of surgical removal of pituitary gland 07/11/2020   . Tension vascular headache 07/11/2020   . Memory loss 11/21/2014   . Primary insomnia 11/21/2014   . Chest pain with normal angiography 06/21/2014   . SOB (shortness of breath) on exertion 06/21/2014       Family History   Problem Relation Age of Onset   . Diabetes Mother    . No Known Problems Father    . Diabetes Brother    . Diabetes Sister    . No Known Problems Son    . No Known  Problems Daughter    . Coronary artery disease Neg Hx    . Clotting disorder Neg Hx    . Anesthesia problems Neg Hx         Social History     Tobacco Use   . Smoking status: Never   . Smokeless tobacco: Never   Substance Use Topics   . Alcohol use: No       Past Medical History:   Diagnosis Date   . Coronary artery disease    . Deep vein thrombosis    . GERD (gastroesophageal reflux disease)    . Hypertension    . Osteoarthritis    . Osteoporosis         History reviewed. No pertinent surgical history.       Current Outpatient Medications:   .  acetaminophen (TYLENOL) 325 MG tablet, Take 325 mg by mouth., Disp: , Rfl:   .  acetaminophen-codeine (TYLENOL with CODEINE #3) 300-30 MG per tablet, TAKE 1 TABLET BY MOUTH EVERY 8 HOURS AS NEEDED FOR SEVERE BREAKTHROUGH PAIN, Disp: , Rfl:   .  ascorbic acid (VITAMIN C) 100 MG tablet, Take by mouth., Disp: , Rfl:   .  aspirin 81 MG chewable tablet, Chew 81 mg., Disp: , Rfl:   .  donepezil (ARICEPT) 5  MG tablet, TAKE 1 TABLET BY MOUTH DAILY BEFORE DINNER, Disp: , Rfl:   .  DULoxetine (CYMBALTA) 30 MG capsule, Take 1 capsule by mouth once daily, Disp: , Rfl:   .  DULoxetine (CYMBALTA) 60 MG capsule, Take 60 mg by mouth, Disp: , Rfl:   .  lidocaine (XYLOCAINE) 2 % jelly, APPLY TO AFFECTED PART TID, Disp: , Rfl: 2  .  pregabalin (LYRICA) 200 MG capsule, , Disp: , Rfl:   .  rOPINIRole (REQUIP) 0.25 MG tablet, Take 1 tablet by mouth nightly, Disp: , Rfl:   .  valsartan (DIOVAN) 40 MG tablet, Take 1 tablet by mouth once daily, Disp: , Rfl:   .  vitamin E (vitamin E) 1000 UNITS capsule, Take 400 Units by mouth., Disp: , Rfl:     Allergies   Allergen Reactions   . Sulfa Antibiotics Other (see comments)   . Copper-Containing Compounds Other (see comments)   . Penicillins Other (see comments)        ROS:   No new bowel or bladder incontinence.  No fever.  No saddle anesthesia.     Objective:   BP (!) 142/84   Ht 5\' 3"    Wt 144 lb   BMI 25.51 kg/m     Body mass index is 25.51  kg/m., a BMI over 30 is considered obese and a BMI over 40 has been associated with a higher risk of surgical complications.    Constitutional: No acute distress. Well nourished.  HEENT: Normocephalic, EOMI  Respiratory:  No labored breathing.  Cardiovascular:  No marked cyanosis.  Skin:  No marked skin ulcers/lesions on bilateral upper or lower extremities.  Psychiatric: Alert and oriented x3.  Inspection: No gross deformity of bilateral upper or lower extremities.  Musculoskeletal/Neurological:   widespread pain to palpation about the lumbar region, diminished ROM throughout the lumbar spine in all planes, spasm to palpation in the lumbar region, increased pain with lumbar hyperextension, rotation and facet joint provocative maneuvers  The patient exhibits 5/5 strength throughout bilateral upper and lower extremities.  No focal motor weakness or wasting is identified.  The patient is able to ambulate with a rollator.      Radiographs:           No imaging obtained   I independently reviewed the films and findings below and agree with the findings.     MRI lumbar spine without contrast (16109)  Order: [id number]  Impression    1. Chronic compression deformity without canal compromise T9-T10.  2. Prominent degenerative changes once again demonstrated with spinal canal  stenosis L3-4 L4-5.  Narrative    EXAM: MRI LUMBAR SPINE WO CONTRAST    INDICATION: Other spondylosis with radiculopathy, lumbosacral region. Other  spondylosis with radiculopathy, lumbosacral region    COMPARISON: 2012    TECHNIQUE: MR imaging of the lumbar spine was performed using the following  sequences: sagittal T1, T2, STIR;  axial T1, T2.    CONTRAST:  None.    FINDINGS:  Grade 1 spondylolisthesis are chronic at L3-4 and L4-5.  The conus appears unremarkable.  Remote but new since the prior examination compression deformity without canal  compromise at T9 and T10.  No evidence for bone marrow replacement.  No paraspinal masses or fluid  collections.    T11-T12 shows no focal findings.  T12-L1 shows no focal findings.  L1-L2 show central disc bulging mild facet disease but no canal or foraminal  compromise.  L2-L3 shows some mild facet  disease and disc degeneration no canal or foraminal  compromise.  Spinal canal stenosis is prominent at L3-4 with facet hypertrophy, ligamentum  thickening and bilateral foraminal stenosis.  Spinal stenosis also demonstrated at L4-5 oh with bilateral moderate foraminal  stenosis.  L5-S1 shows no focal findings.  Exam End: 09/19/22  3:08 PM    Specimen Collected: 09/20/22  8:30 AM      Assessment:       ICD-10-CM   1. Intervertebral disc disorder with radiculopathy of lumbar region  M51.16   2. Lumbar degenerative disc disease  M51.36   3. Spondylolisthesis, lumbosacral region  M43.17   4. Lumbar spondylosis  M47.816   5. Spinal stenosis of lumbar region without neurogenic claudication  M48.061       Plan:   Low back pain: Medical management/ HEP discussed  Lumbar Degenerative disc disease: schedule bilat L3 transforaminal epidural steroid injection  Lumbar degenerative joint disease/Facet arthropathy: Consider facet joint injections/MBB/RFA  Lumbosacral radiculopathy/sciatica: cont pregabalin, duloxetine    Follow-up after injections    Orders Placed This Encounter   . Spine Injection   . BP Patient Education   . BMI Patient Education      No follow-ups on file.       Frederich Balding, NP      Supervising physician: Verne Grain

## 2022-10-04 NOTE — Telephone Encounter (Signed)
Noted.  Dr Doyce Para made aware

## 2022-10-05 NOTE — Care Coordination-Inpatient (Signed)
Care Transitions Follow Up Call    Patient Current Location:  [facility] Transition Nurse contacted the  patient's niece, Cherlyn Labella (on [place] dated 05-29-22),  by telephone to follow up after admission on 09-12-22.  Verified name and DOB with  the niece  as identifiers.    Patient: Kimberly [name]  Patient DOB: 1924/05/29   MRN: [id number]  Reason for Admission: CVA rule out   Discharge Date: 09/14/22 RARS: Readmission Risk Score: 14.5    Needs to be reviewed by the provider   Additional needs identified to be addressed with provider: Yes  - Patient's niece states home health expressed concern about patient's elevated blood pressure readings:       10-03-22 at 10am: 160/80       10-04-22 at 5pm:  168/92  The niece would like to discuss an adjustment in dosage and/or frequency of blood pressure medication. Please reach out to the niece to discuss blood pressure medication(s).   - Patient remains on cancellation list at PCP office for sooner appointment than her next follow-up which is currently scheduled on 11-30-22.        Method of communication with provider: chart routing to PCP.     Discussed follow-up appointments. If no appointment was previously scheduled, appointment scheduling offered: N/A, patient was marked as 'No Show' for her [facility] appointment with her PCP on 09-25-22, and patient's niece, Cherlyn Labella (on [place] dated 05-29-22), states she and the patient drove to the incorrect office location and then missed the 15 minute late window to be seen for [facility] appointment on 09-25-22.  Patient's next PCP follow-up appointment is currently scheduled on 11-30-22; however the niece states patient is on cancellation list for a sooner PCP appointment.    Was follow up appointment scheduled within 7 days of discharge? Yes, patient attended neuro appointment with Dr. Danton Sewer on 09-18-22 as scheduled.    [place] Follow Up:   Future Appointments   Date Time Provider [place]   10/24/2022  3:00 PM [facility]  [facility]UM[facility] BS AMB   11/28/2022  1:40 PM Luellen Pucker, MD [facility] BS AMB   11/30/2022  3:20 PM Doyce Para Lajuana Carry, MD [facility] BS AMB   12/05/2022 11:00 AM Cathlyn Parsons, MD [facility] BS AMB   03/20/2023 11:00 AM Shaune Pollack, MD [facility] BS AMB     External follow up appointment(s): None noted at this time.     Care Transition Nurse reviewed medical action plan and red flags with  the niece  and discussed any barriers to care and/or understanding of plan of care after discharge. Discussed appropriate site of care based on symptoms and resources available to patient including:   PCP  Specialist  Urgent care clinics  Home health  When to call [place] .   The  niece  agrees to contact the PCP office for questions related to patient's healthcare.     Interventions to address risk factors: Education of patient/family/caregiver/guardian to support self-management-Education provided regarding signs/symptoms of CVA/TIA, the niece verbalized an understanding.      Offered patient enrollment in the Remote Patient Monitoring (RPM) program for in-home monitoring:  N/A at this time .     Care Transition Nurse provided contact information for future needs. Plan for follow-up call in 10-14 days based on severity of symptoms and risk factors.  Plan for next call: self management-Review red flags of CVA/TIA and,  follow-up appointment-Evaluate if patient continues  to attend follow-up appointments as scheduled, offer assistance with scheduling as needed.      Goals Addressed                   This Visit's Progress     Conditions and Symptoms   On track     I will schedule office visits, as directed by my provider.  I will keep my appointment or reschedule if I have to cancel.  I will notify my provider of any barriers to my plan of care.  I will notify my provider of any symptoms that indicate a worsening of my condition.    Barriers: none  Plan for overcoming my barriers: N/A  Confidence: 8/10  Anticipated Goal  Completion Date: 10-15-22    09-17-22: Care Transitions Nurse (CTN) assisted patient in scheduling [facility] appointment with her PCP on 09-25-22. Patient has neuro appointment scheduled with Dr. Danton Sewer on 09-18-22. JB    09-26-22: Patient was marked 'No Show' for her [facility] appointment that was scheduled on 09-25-22, and patient's niece, Cherlyn Labella (on [place] dated 05-29-22), states she and the patient drove to the incorrect office location and then missed the 15 minute late window to be seen for [facility] appointment on 09-25-22.  Patient's next PCP follow-up appointment is currently scheduled on 11-30-22; however the niece states patient is on cancellation list for a sooner PCP appointment. Patient attended neuro appointment with Dr. Danton Sewer on 09-18-22 as scheduled. Patient has ortho appointment scheduled with Jeanelle Malling on 10-04-22. JB     10-05-22: Per niece, Cherlyn Labella (on [place] dated 05-29-22), patient remains on cancellation list for PCP appointment, and the niece states patient attended ortho appointment on 10-04-22 as scheduled. Erenest Blank, RN

## 2022-10-08 NOTE — Progress Notes (Signed)
TFESI Lumbar R L3-L4 and L L3-L4      Performed by: Carmela Hurt, MD  Authorized by: Ailene Ravel, NP      Consent Given by:  Patient  Timeout: prior to procedure the correct patient, procedure, and site was verified    Preparation:  Skin prepped with ChloraPrep  Procedure:  TFESI  Indications:  Therapeutic benefit  Guidance:  Fluoroscopy  Location(s):  Lumbar  Level(s):  R L3-L4 and L L3-L4  Site(s):     Additional Procedural Details:  PHYSICIAN:  Kriste Basque, M.D.     HISTORY AND PHYSICAL:  Included in the patient's chart per office notes.     CONSENT:  Prior to the procedure, the patient was told about the procedure and offered information regarding the nature of the procedure, expected outcomes and risks.   The risks discussed include bleeding in the spine, infection in the spine, injury to the nerve roots, and allergic reaction.  The side of pain is confirmed prior to proceeding with the procedure.       PROCEDURE:   The patient is placed in the prone position on the fluro table and prepped in the usual sterile fashion.  Sterile prep is used to cleanse the skin prior to proceeding     The fluroscope is angled to obtain an oblique view of the above noted foramen.  The skin directly over the target foramen is anesthetized with 10m of 1% Lidocaine.  A 25 gauge spinal needle is advanced to the 6 o'clock position of the pedicle of the foramen and contact is made with the transverse process.  The needle is then walked off the process and advanced to the floor of the foramen.  Needle position is confirmed with an oblique, lateral and AP view.  Approximately 190mof contrast is injected and flow of contrast is noted.  Dexamethasone is injected at the level indicated above. Permanent fluoroscopic images and X-ray dosage were saved in the patient's record ([place]).    Medication Administered: 10 mL lidocaine PF 1 %; 2 mL dexAMETHasone Sod Phosphate PF 10 MG/ML; 2 mL iohexol 300 MG/ML    Needle Size:  25  G  Needle Length:  5.0 in  Pt Tolerated Procedure:  Adequately

## 2022-10-08 NOTE — Progress Notes (Signed)
Subjective:     Patient ID: Kimberly [name] is a 89 y.o. female.    Chief Complaint: Pain    HPI:  Kimberly [name] is a 89 y.o. female with complaints of low back and leg pain      Patient Active Problem List    Diagnosis Date Noted   . High cholesterol 10/04/2022   . Hypertension, essential, benign 10/04/2022   . Acute CVA (cerebrovascular accident) 09/12/2022   . Cervical radiculopathy due to degenerative joint disease of spine 08/21/2022   . Dementia of the Alzheimer's type with late onset without behavioral disturbance 08/21/2022   . Lumbosacral radiculopathy due to degenerative joint disease of spine 07/25/2022   . Degenerative cervical spinal stenosis 05/29/2022   . Diabetic peripheral neuropathy associated with type 2 diabetes mellitus 05/29/2022   . Idiopathic small fiber peripheral neuropathy 05/29/2022   . Chronic renal disease, stage III 02/13/2021   . PVD (peripheral vascular disease) 01/18/2021   . Bilateral carotid artery stenosis 07/11/2020   . Cerebral microvascular disease 07/11/2020   . History of surgical removal of pituitary gland 07/11/2020   . Tension vascular headache 07/11/2020   . Memory loss 11/21/2014   . Primary insomnia 11/21/2014   . Chest pain with normal angiography 06/21/2014   . SOB (shortness of breath) on exertion 06/21/2014       Family History   Problem Relation Age of Onset   . Diabetes Mother    . No Known Problems Father    . Diabetes Brother    . Diabetes Sister    . No Known Problems Son    . No Known Problems Daughter    . Coronary artery disease Neg Hx    . Clotting disorder Neg Hx    . Anesthesia problems Neg Hx         Social History     Tobacco Use   . Smoking status: Never   . Smokeless tobacco: Never   Substance Use Topics   . Alcohol use: No       Past Medical History:   Diagnosis Date   . Coronary artery disease    . Deep vein thrombosis    . GERD (gastroesophageal reflux disease)    . Hypertension    . Osteoarthritis    . Osteoporosis         History reviewed. No  pertinent surgical history.       Current Outpatient Medications:   .  acetaminophen (TYLENOL) 325 MG tablet, Take 325 mg by mouth., Disp: , Rfl:   .  acetaminophen-codeine (TYLENOL with CODEINE #3) 300-30 MG per tablet, TAKE 1 TABLET BY MOUTH EVERY 8 HOURS AS NEEDED FOR SEVERE BREAKTHROUGH PAIN, Disp: , Rfl:   .  ascorbic acid (VITAMIN C) 100 MG tablet, Take by mouth., Disp: , Rfl:   .  aspirin 81 MG chewable tablet, Chew 81 mg., Disp: , Rfl:   .  donepezil (ARICEPT) 5 MG tablet, TAKE 1 TABLET BY MOUTH DAILY BEFORE DINNER, Disp: , Rfl:   .  DULoxetine (CYMBALTA) 30 MG capsule, Take 1 capsule by mouth once daily, Disp: , Rfl:   .  DULoxetine (CYMBALTA) 60 MG capsule, Take 60 mg by mouth, Disp: , Rfl:   .  lidocaine (XYLOCAINE) 2 % jelly, APPLY TO AFFECTED PART TID, Disp: , Rfl: 2  .  pregabalin (LYRICA) 200 MG capsule, , Disp: , Rfl:   .  rOPINIRole (REQUIP) 0.25 MG tablet, Take 1 tablet by mouth  nightly, Disp: , Rfl:   .  valsartan (DIOVAN) 40 MG tablet, Take 1 tablet by mouth once daily, Disp: , Rfl:   .  vitamin E (vitamin E) 1000 UNITS capsule, Take 400 Units by mouth., Disp: , Rfl:     Allergies   Allergen Reactions   . Sulfa Antibiotics Other (see comments)   . Copper-Containing Compounds Other (see comments)   . Penicillins Other (see comments)        ROS:   No fevers or chills.  No Chest pain or SOB         Objective:   BP (!) 137/69   Pulse (!) 95   Temp 98.2 F (Oral)   Resp 16   Ht 5\' 3"    Wt 146 lb   SpO2 96%   BMI 25.86 kg/m  -please refer to vitals in nursing documentation    Body mass index is 25.86 kg/m.    Constitutional: No acute distress. Well nourished.  HEENT: Normocephalic.  Respiratory:  No labored breathing.  Cardiovascular:  No marked cyanosis.  Skin:  No marked skin ulcers/lesions on bilateral upper or lower extremities.  Psychiatric: Alert and oriented x3.  Inspection: No gross deformity of bilateral upper or lower extremities.        Assessment:     1. Intervertebral disc  disorder with radiculopathy of lumbar region           Plan:   Kimberly [name] is appropriate to proceed with injection today    No orders of the defined types were placed in this encounter.     No follow-ups on file.         Verne Grain, MD

## 2022-10-08 NOTE — Telephone Encounter (Signed)
Dr. Merrie Roof would like patient to follow up with Ortho regarding pain after PT. Niece to call and sched. Virtual Appt.to address Elevated BP.

## 2022-10-08 NOTE — Progress Notes (Signed)
Intra Spine Procedure Flow Sheet    Patient:  Kimberly [name]  MRN: [id number]  Date: 10/08/22    INTRA-PROCEDURE  Physician: Carmela Hurt, MD  Circulator:  A HARRIS RN  X-Ray Tech: A CASEY RT   Other:    Patient In: 1623  Procedure Time Out: 1625  Procedure Start: [id number]  Procedure Stop: [id number]  Fluoro Time:   Patient Out: 1632    Is this a kyphoplasty/SCS trial/DRG trial? No  Positioning of Patient: prone, pillow under head/trunk, pillow/wedge under hips, and safety strap  Patient prepped with Chlorhexidine by:     Procedures BILATER L3 TFESI     Intra-operative Vital Signs:  Time HR Resp BP O2 sats End Tidal   CO2 LOC Rhythm   1625 85 16  95  AOX3                                    Moderate Sedation: No     Dressing applied: none  Discharged to post-procedure: Transferred from procedure room table to stretcher/wheelchair without incident and To post-procedure, free from injury and in satisfactory condition  Intra-Op Comments:  Intra-op Documented by: Adella Hare, RN

## 2022-10-10 ENCOUNTER — Telehealth: Admit: 2022-10-10 | Discharge: 2022-10-10 | Payer: MEDICARE | Attending: Family Medicine | Primary: Family Medicine

## 2022-10-10 ENCOUNTER — Ambulatory Visit: Payer: MEDICARE | Attending: Family Medicine | Primary: Family Medicine

## 2022-10-10 DIAGNOSIS — I10 Essential (primary) hypertension: Secondary | ICD-10-CM

## 2022-10-10 MED ORDER — VALSARTAN 80 MG PO TABS
80 MG | ORAL_TABLET | Freq: Every day | ORAL | 0 refills | Status: DC
Start: 2022-10-10 — End: 2023-01-15

## 2022-10-10 NOTE — Progress Notes (Signed)
[place]  [phone]. [place] [address]  [phone]    Chief Complaint: Kimberly [name] is a 89 y.o. Black / [place] American female , established patient, here for evaluation of the concern(s) above;    SUBJECTIVE:     Pt would like to be evaluated for the problem(s) listed above:     PMHx: HTN, PVD, pituitary mirroadenoma S/P surgery (follows with Dr. Emilee Hero), DM with peripheral neuropathy, DDD , CKD 3a, Malignant neoplasm of left breast, Memory disturbance (follows with neuro), HLD who presents for follow up of chronic problems;     Accompanied By: Niece Kimberly [name])     HTN:  This morning the PT checked BP and it was 160/88 mmHg.  BP was 168/92 mmHg prior to OT session yesterday.  Pt gets OT once weekly and PT twice weekly.     Neuropathy:  On Lyrica '200mg'$  BID for neuropathy managed by neurology.     Uses a Rolator walker when needed     Pt denies any  fever, chill, chest pain, SOB, abdominal pain, n/v/d, HA or dizziness.      Other Health Habits and social history:  Her only daughter passed away.     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     Review of systems:       A comprehensive review of systems was negative except for that written in the History of Present Illness.         PHYSICAL EXAM:     General: alert, cooperative, no distress   Mental  status: mental status: alert, oriented to person, place, and time, normal mood, behavior, speech, dress, motor activity, and thought processes   Resp: resp: normal effort and no respiratory distress   Neuro: neuro: no gross deficits   Skin: skin: no discoloration or lesions of concern on visible areas   Due to this being a TeleHealth evaluation, many elements of the physical examination are unable to be assessed.        ASSESSMENT/PLAN:      Diagnosis Orders   1.  Advance care planning  [facility] -  Referral to ACP Clinical Specialist      2. Primary hypertension  valsartan (DIOVAN) 80 MG tablet        1. Primary hypertension  Will increase BP meds given levels still elevated at home  - valsartan (DIOVAN) 80 MG tablet; Take 1 tablet by mouth daily      2. Advance care planning    Caregiver would like referral to discuss goals of care  - [facility] -  Referral to ACP Clinical Specialist       FOLLOW UP: 6 weeks or sooner if needed       We discussed the expected course, resolution and complications of the diagnosis(es) in detail. Patient was in [place] at the time of consultation. Medication risks, benefits, costs, interactions, and alternatives were discussed as indicated.  I advised her to contact the office if her condition worsens, changes or fails to improve as anticipated. She expressed understanding with the diagnosis(es) and plan. Patient understands that this encounter was a temporary measure, and the importance of further follow up and examination was emphasized.  Patient verbalized understanding.           Kimberly [name] is  being evaluated by a Virtual Visit (video visit) encounter to address concerns as mentioned above.  A caregiver was present when appropriate. Due to this being a Scientist, physiological (During [id number] public health emergency), evaluation of the following organ systems was limited: Vitals/Constitutional/EENT/Resp/CV/GI/GU/MS/Neuro/Skin/Heme-Lymph-Imm.  Pursuant to the emergency declaration under the [place] waiver authority and the [employer] Act, this Virtual Visit was conducted with patient's (and/or legal guardian's) consent, to reduce the patient's risk of exposure to COVID-19 and provide necessary medical care.  The patient (and/or legal guardian) has also been advised to contact this office for worsening conditions or problems, and seek emergency medical  treatment and/or call 911 if deemed necessary.     Patient identification was verified at the start of the visit: YES     Services were provided through a video synchronous discussion virtually to substitute for in-person clinic visit. Patient was located at home and provider was located in office or at home.      An electronic signature was used to authenticate this note.     -- Clearence Ped, MD      CPT Codes [phone] for Established Patients may apply to this Telehealth Visit.  POS code: 1.  Modifier GT

## 2022-10-10 NOTE — Progress Notes (Signed)
Chief Complaint   Patient presents with    Hypertension    Headache       "Have you been to the ER, urgent care clinic since your last visit?  Hospitalized since your last visit?"    NO    "Have you seen or consulted any other health care providers outside of [place] since your last visit?"    NO       There were no vitals filed for this visit.   Health Maintenance Due   Topic Date Due    Shingles vaccine (1 of 2) Never done    Respiratory Syncytial Virus (RSV) Pregnant or age 76 yrs+ (45 - 1-dose 60+ series) Never done    Pneumococcal 65+ years Vaccine (2 - PCV) 08/19/2015    Annual Wellness Visit (Medicare)  08/19/2022      The patient, Kimberly [name], identity was verified by name and DOB. Niece present

## 2022-10-11 NOTE — ACP (Advance Care Planning) (Signed)
 Advance Care Planning   Ambulatory ACP Specialist Patient Outreach    Date:  10/11/2022    ACP Specialist:  Darryle CHRISTELLA Pouch, LCSW    Outreach call to patient in follow-up to ACP Specialist referral from:[place], Darin MATSU, MD    [x]  PCP  []  Provider   []  Ambulatory Care Management []  Other     For:                  [x]  Advance Directive Assistance              [x]  Complete Portable DNR order              []  Complete POST/POLST/MOST              [x]  Code Status Discussion             [x]  Discuss Goals of Care             []  Early ACP Decision-Making              []  Other (Specify)    Date Referral Received:10/11/2022    Next Step:   [x]  ACP scheduled conversation  []  Outreach again in one week               []  Email / Mail ACP Info Sheets  []  Email / Mail Advance Directive   []  Closing referral.  Routing closure to referring provider/staff and to Lexicographer.    []  Closure letter mailed to patient with invitation to contact ACP Specialist if / when ready.   []  Other (Specify here):         [x]  At this time, Healthcare Decision Maker Is:        []  Primary agent named in scanned advance directive.    []  Legal Next of Kin.     [x]  Unable to determine legal decision maker at this time.      Outreaches:       [x]  1st -  Date:  10/11/2022               Intervention:  []  Spoke with Patient   []  Left Voice mail []  Email / Mail    []  MyChart  [x]  Other (Specify) : Spoke with patient's niece, [name]    Outcomes: ACP Specialist made a telephone call to patient's niece, [name], [phone]) as ACP referral provided information that niece should be main contact to offer this Advance Care Planning appointment. Ms. Jacobsen answered call and would like to set up an [employer] appointment for patient, herself and a couple of patient's other nieces and nephews to discuss Advance Care Planning options for patient.  ACP appointment scheduled for Thursday, October 18, 2022 at 12:30pm.     Thank you for this  referral.     Darryle CHRISTELLA. Pouch HUGHS, LISW-CP  Advance Care Planning Specialist  [place]  [facility] [employer]  [employer]  [phone]

## 2022-10-16 NOTE — Care Coordination-Inpatient (Signed)
Patient has graduated from the Care Transitions program on 10-16-22.  Patient/family have the ability to self-manage at this time. Care management goals have been completed. Patient was not referred to the Ambulatory Case Management team for further management.      Goals Addressed                   This Visit's Progress     COMPLETED: Conditions and Symptoms   On track     I will schedule office visits, as directed by my provider.  I will keep my appointment or reschedule if I have to cancel.  I will notify my provider of any barriers to my plan of care.  I will notify my provider of any symptoms that indicate a worsening of my condition.    Barriers: none  Plan for overcoming my barriers: N/A  Confidence: 8/10  Anticipated Goal Completion Date: 10-15-22    09-17-22: Care Transitions Nurse (CTN) assisted patient in scheduling [facility] appointment with her PCP on 09-25-22. Patient has neuro appointment scheduled with Dr. Danton Sewer on 09-18-22. JB    09-26-22: Patient was marked 'No Show' for her [facility] appointment that was scheduled on 09-25-22, and patient's niece, Cherlyn Labella (on [place] dated 05-29-22), states she and the patient drove to the incorrect office location and then missed the 15 minute late window to be seen for [facility] appointment on 09-25-22.  Patient's next PCP follow-up appointment is currently scheduled on 11-30-22; however the niece states patient is on cancellation list for a sooner PCP appointment. Patient attended neuro appointment with Dr. Danton Sewer on 09-18-22 as scheduled. Patient has ortho appointment scheduled with Jeanelle Malling on 10-04-22. JB     10-05-22: Per niece, Cherlyn Labella (on [place] dated 05-29-22), patient remains on cancellation list for PCP appointment, and the niece states patient attended ortho appointment on 10-04-22 as scheduled. JB .    10-16-22: Goal met. JB                 Patient and patient's niece, Cherlyn Labella (on [place] dated 05-29-22), have Care Transition Nurse's contact  information for any further questions, concerns, or needs.  Patients upcoming visits:    Future Appointments   Date Time Provider [place]   10/24/2022  3:00 PM [facility] BS AMB   11/28/2022  1:40 PM Luellen Pucker, MD [facility] BS AMB   11/30/2022  2:00 PM Cathlyn Parsons, MD [facility] BS AMB   12/05/2022 11:00 AM Cathlyn Parsons, MD [facility] BS AMB   03/20/2023 11:00 AM Shaune Pollack, MD [facility] BS AMB

## 2022-10-16 NOTE — Progress Notes (Signed)
[facility] Order [phone] faxed to [phone]

## 2022-10-18 NOTE — Progress Notes (Signed)
[facility] order [id number] to [id number]

## 2022-10-18 NOTE — ACP (Advance Care Planning) (Signed)
Advance Care Planning     Advance Care Planning Clinical Specialist  Conversation Note      Date of ACP Conversation: 10/18/2022    Conversation Conducted with: Patient with Decision Making Capacity   Healthcare Decision Maker: Named in Forensic psychologist (name) Niece, Kimberly [name]; Niece, Kimberly [name], Kimberly [name], Kimberly [name]     ACP Clinical Specialist: Guadlupe Spanish, LCSW    Healthcare Decision Maker:     Current Designated Healthcare Decision Maker:     Primary Decision Maker: Kimberly [name] [phone]    Primary Decision Maker: Kimberly [name] - Niece/Nephew [phone]    Secondary Decision Maker: Kimberly [name] [phone]    Care Preferences    Ventilation:  "If you were in your present state of health and suddenly became very ill and were unable to breathe on your own, what would your preference be about the use of a ventilator (breathing machine) if it were available to you?"      If the patient would desire the use of ventilator (breathing machine), answer "yes".  If not, "no": yes    "If your health worsens and it becomes clear that your chance of recovery is unlikely, what would your preference be about the use of a ventilator (breathing machine) if it were available to you?"     Would the patient desire the use of ventilator (breathing machine)?: No      Resuscitation  "CPR works best to restart the heart when there is a sudden event, like a heart attack, in someone who is otherwise healthy. Unfortunately, CPR does not typically restart the heart for people who have serious health conditions or who are very sick."    "In the event your heart stopped as a result of an underlying serious health condition, would you want attempts to be made to restart your heart (answer "yes" for attempt to resuscitate) or would you prefer a natural death (answer "no" for do not attempt to resuscitate)?"  Pt discussed her concerns about receiving  resuscitation and wants to think about becoming a Do Not Resuscitate and having this order signed by her physician.  A blank DNR document will be mailed to patient to take with her to her physician to be signed if patient decides she does wish to become a DNR Code Status.       [x]  Yes   []  No   Educated Patient / Decision Maker regarding differences between Advance Directives and portable DNR orders.    Length of ACP Conversation in minutes:  65    Conversation Outcomes:  ACP discussion completed, New Advance Directive completed, and Portable Do Not Resuscitate prepared for Provider review and signature    Follow-up plan:    []  Schedule follow-up conversation to continue planning  [x]  Referred individual to Provider for additional questions/concerns   [x]  Advised patient/agent/surrogate to review completed ACP document and update if needed with changes in condition, patient preferences or care setting    [x]  This note routed to one or more involved healthcare providers    Conversation Summary:    ACP Specialist met with patient, her nieces, Kimberly [name] and Kimberly [name] and her nephew, Kimberly [name] for scheduled Advance Care Planning appointment.  Pt was able to complete full ACP discussion and completed the [place] for Health Care document during today's appointment.  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    Pt wishes to name both her niece, Kimberly [name] and nephew, Kimberly [name], as her shared primary health care decisions makers.  She has named [employer] as a secondary health care agent.  In the [place] Advance Directive for Health Care document, Section II (My Health Care Instructions), Pt has stated that she would not want any treatments to prolong her life in Scenario #1 and would want to try treatments for a period of 1 week before  stopping treatments if there were no improvement in her condition in Scenario #2.  Pt has written, "I would want my priest to visit and provide last sacraments.  I would like to be made comfortable" as her other healthcare instructions.  Pt has stated that she does not desire to be an organs, eyes, tissues or whole body donor at this time and this was reflected on her [place] for [place] document.    Pt discussed her possible desire to be made a Durable Do Not Resuscitate and initially had indicated that she would not want to risk receiving broken bones.  After having a more in-depth discussion about CPR, patient indicated that she was not sure at this time how she would wish to proceed if her heart were to stop beating and states she will think about this a bit longer, but requests having a DDNR document sent to her to take to her physician if she does decide she is ready to have a DDNR order in place.     Plan: [place] Advance Directive for Health Care document has been typed up and will be mailed to patient's home address along with a prepared DDNR document for patient to review, date and sign document.  Pt will be responsible for obtaining signatures of two witnesses and returning  [place] for Health Care document for upload into the medical record. Pt would need to get a physician to sign the DDNR order if she decides to move forward with becoming a Durable Do Not Resuscitate.  ACP Specialist will contact patient in 2-3 weeks to ensure mailed document was received and that there are no additional ACP needs.    [place]cott Parma. Bettey Costa, LISW-CP  Advance Care Planning Specialist  [facility] [employer]  [phone]

## 2022-10-24 ENCOUNTER — Ambulatory Visit: Admit: 2022-10-24 | Payer: MEDICARE | Primary: Family Medicine

## 2022-10-24 DIAGNOSIS — M4722 Other spondylosis with radiculopathy, cervical region: Secondary | ICD-10-CM

## 2022-10-24 NOTE — Progress Notes (Signed)
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 Darnell Level in addition.

## 2022-10-24 NOTE — Procedures (Signed)
ELECTRODIANOSTIC REPORT  Test Date:  10/24/2022    Patient: Kimberly [name] DOB: Aug 15, 1923 Physician: Danton Sewer, MD   Sex: Female Tech: Brunilda Payor, EMG Technician  Ref Phys:      Technician: Brunilda Payor    Clinical Indication: Patient is a 89 year old female with a history of right hand pain, and numbness, EMG study to rule out cervical radiculopathy versus carpal tunnel syndrome and entrapment neuropathy or other neuromuscular disease.  Patient has known idiopathic neuropathy in her arms and legs of moderate severity and severe lumbar spinal stenosis and radiculopathy and cervical degenerative disease.  Patient is not diabetic    Neuro exam: Patient has hypoactive but symmetric reflexes throughout and no Babinski or clonus present.  Patient appears to have mild generalized weakness but no clear focal weakness noted.  Patient sensory examination to temperature vibration and pin is slightly decreased in both feet but normal in the arms and hands.  Patient may have a Tinel's over the median nerve on the right side.  Patient has restricted movement due to all her arthritis and has to walk with a walker.    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  There may also be present evidence of a polyneuropathy as manifest by the slightly low compound motor and sensory action potentials as noted.  No clear evidence of carpal tunnel syndrome seen or other entrapment neuropathy on the basis of this exam.  Clinical correlation recommended, and correlation with other imaging modalities and metabolic studies may be of further diagnostic benefit.          EMG & NCV Findings:  Evaluation of the right median motor and the right median sensory nerves showed reduced amplitude (R3.8, R7.6 V).  The right median/ulnar (palm) comparison nerve showed  prolonged distal peak latency (Median Palm, 2.7 ms) and reduced amplitude (Median Palm, 6.7 V).  All remaining nerves (as indicated in the following tables) were within normal limits.        Nerve Conduction Studies  Anti Sensory Summary Table     Stim Site NR Peak (ms) Norm Peak (ms) O-P Amp (V) Norm O-P Amp Site1 Site2 Dist (cm)   Right Median Anti Sensory (2nd Digit)  33.2 C   Wrist    3.5 <4 7.6 >11 Wrist 2nd Digit 14.0   Right Radial Anti Sensory (Base 1st Digit)  33.2 C   Wrist    2.2 <2.9 15.9 >15 Wrist Base 1st Digit 10.0   Right Ulnar Anti Sensory (5th Digit)  33.2 C   Wrist    3.6 <4.0 10.0 >10 Wrist 5th Digit 14.0     Motor Summary Table     Stim Site NR Onset (ms) Norm Onset (ms) O-P* Amp (mV) Norm O-P Amp P-T Amp (mV) Site1 Site2 Dist (cm) Vel (m/s)   Right Median Motor (Abd Poll Brev)  33.2 C   Wrist    4.1 <4.5 3.8 >4.1  Wrist Abd Poll Brev 8.0 20   Elbow    8.0  3.1   Elbow Wrist 20.5 53   Right Ulnar Motor (Abd Dig Minimi)  33.2 C   Wrist    2.9 <3.1 7.4 >7.0  Wrist Abd Dig Minimi 8.0 28   B Elbow    6.5  5.5   B Elbow Wrist 20.0 56   A  Elbow    8.3  5.0   A Elbow B Elbow 10.0 56     Comparison Summary Table     Stim Site NR Peak (ms) P-T Amp (V) Site1 Site2 Dist (cm) Delta-0 (ms)   Right Median/Ulnar Palm Comparison (Wrist)  33.2 C   Median Palm    2.7 14.4 Median Palm Ulnar Palm 8.0 0.9   Ulnar Palm    1.9 36.7         EMG     Side Muscle Nerve Root Ins Act Fibs Psw Recrt Duration Amp Poly Comment   Right 1stDorInt Ulnar C8-T1 Incr 1+ 1+ Reduced Incr Incr 1+    Right ABD Dig Min Ulnar C8-T1 Incr 1+ 1+ Reduced Incr Incr 1+    Right Abd Poll Brev Median C8-T1 Incr 1+ 1+ Reduced Incr Incr 1+    Right Biceps Musculocut C5-6 Nml Nml Nml Nml Nml Nml Nml    Right Deltoid Axillary C5-6 Nml Nml Nml Nml Nml Nml Nml    Right FlexCarpiUln Ulnar C8,T1 Nml Nml Nml Nml Nml Nml Nml    Right FlexPolLong Median (Ant Int) C7-8 Incr 1+ 1+ Nml Nml Nml Nml    Right Lower Cerv Parasp Rami C7,T1 Nml Nml Nml Nml  Nml Nml Nml    Right Triceps Radial C6-7-8 Nml Nml Nml Nml Nml Nml Nml        Waveforms:                        __________________  Danton Sewer, M.D.

## 2022-10-25 ENCOUNTER — Emergency Department: Admit: 2022-10-26 | Payer: MEDICARE | Primary: Family Medicine

## 2022-10-25 DIAGNOSIS — W19XXXA Unspecified fall, initial encounter: Secondary | ICD-10-CM

## 2022-10-25 DIAGNOSIS — S300XXA Contusion of lower back and pelvis, initial encounter: Secondary | ICD-10-CM

## 2022-10-25 NOTE — ED Triage Notes (Signed)
Pt arrives w/ family member to triage after GLF. Pt states she fell backwards while using her Rolator and landed on her tailbone. Pt reports sacral pain. No LOC. Head strike+. No blood thinners.

## 2022-10-25 NOTE — Progress Notes (Signed)
[place] [phone]

## 2022-10-25 NOTE — ED Provider Notes (Signed)
[facility] EMERGENCY [place]      Pt Name: Kimberly [name]  MRN: [id number]  [place] May 11, 1924  Date of evaluation: 10/25/2022  Provider: Myles Rosenthal, Hanna       Chief Complaint   Patient presents with    Fall         HISTORY OF PRESENT ILLNESS   (Location/Symptom, Timing/Onset, Context/Setting, Quality, Duration, Modifying Factors, Severity)  Note limiting factors.   89 year old female comes in for fall.  Patient states that she was trying to get a palpable short toilet and she was unable to do so.  When people were trying to help her get off but she inadvertently fell backwards landing on her backside and then hitting her head on the wall of the shower.  She comes in with a slight headache as well as coccyx pain.  She denies loss of consciousness.  She denies any other neck or back discomfort.  No numbness or tingling.  No weakness.  No chest pain shortness of breath fevers or chills.  No other complaints.            Review of External Medical Records:     Nursing Notes were reviewed.    REVIEW OF SYSTEMS    (2-9 systems for level 4, 10 or more for level 5)     Review of Systems    Except as noted above the remainder of the review of systems was reviewed and negative.       PAST MEDICAL HISTORY     Past Medical History:   Diagnosis Date    Adverse effect of anesthesia     slow to wake    Anginal pain ([place])     Arthritis     Breast cancer ([place]) 03/10/2018     left lumpectomy    Cancer ([place]) 01/2018    breast    Chronic pain     neuropathy    Constipation     GERD (gastroesophageal reflux disease)     Headache     Hearing loss     High cholesterol     Hypertension     Incontinence     Joint pain     Memory disorder     Muscle pain     Osteoporosis     Ringing in the ears     Snoring     Visual disturbance          SURGICAL HISTORY       Past Surgical History:   Procedure Laterality Date    BREAST LUMPECTOMY Left 03/10/2018    LEFT BREAST LUMPECTOMY WITH ULTRASOUND  GUIDANCE performed by Luellen Pucker, MD at [place] (CERVIX STATUS UNKNOWN)      ORTHOPEDIC SURGERY      epidural pain mtg - pt unsure    OTHER SURGICAL HISTORY Right 10/15/2016    ligament correction right foot    OTHER SURGICAL HISTORY  11/26/2016    Pituitary tumor removal    US BREAST BIOPSY W LOC DEVICE 1ST LESION LEFT Left 01/21/2018    US BREAST NEEDLE BIOPSY LEFT 01/21/2018 [facility] [place]         CURRENT MEDICATIONS       Previous Medications    ACETAMINOPHEN (TYLENOL) 500 MG TABLET    Take by mouth every 6 hours as needed    ACETAMINOPHEN-CODEINE (TYLENOL #3) 300-30 MG PER TABLET  Take 1 tablet by mouth every 6 hours as needed.    AMMONIUM LACTATE (LAC-HYDRIN) 12 % LOTION    Apply 1 Application topically 2 times daily as needed    ASCORBIC ACID (VITAMIN C) 100 MG TABLET    Take 1 tablet by mouth daily    ASPIRIN 81 MG CHEWABLE TABLET    Take by mouth daily    CHOLECALCIFEROL (VITAMIN D3) 50 MCG (2000 UT) CAPS    Take 1 capsule by mouth daily    DONEPEZIL (ARICEPT) 5 MG TABLET    Take 1 tablet by mouth daily (before dinner)    DULOXETINE (CYMBALTA) 30 MG EXTENDED RELEASE CAPSULE    TAKE 1 CAPSULE BY MOUTH DAILY    DULOXETINE (CYMBALTA) 60 MG EXTENDED RELEASE CAPSULE    Take 1 capsule by mouth daily (before dinner)    PREGABALIN (LYRICA) 200 MG CAPSULE    Take 1 capsule by mouth 2 times daily for 180 days. Max Daily Amount: 400 mg    ROPINIROLE (REQUIP) 0.25 MG TABLET    TAKE 1 TABLET BY MOUTH EVERY NIGHT    VALSARTAN (DIOVAN) 80 MG TABLET    Take 1 tablet by mouth daily       ALLERGIES     Penicillins and Sulfa antibiotics    FAMILY HISTORY       Family History   Problem Relation Age of Onset    Dementia Sister     Dementia Brother     Cancer Daughter     Breast Cancer Daughter 58    Breast Cancer Sister 29    Breast Cancer Mother 29    No Known Problems Father           SOCIAL HISTORY       Social History     Socioeconomic History    Marital status: Widowed   Tobacco Use     Smoking status: Never    Smokeless tobacco: Never   Vaping Use    Vaping Use: Never used   Substance and Sexual Activity    Alcohol use: Not Currently    Drug use: No     Social Determinants of Health     Financial Resource Strain: Low Risk  (05/18/2022)    Overall Financial Resource Strain (CARDIA)     Difficulty of Paying Living Expenses: Not hard at all   Transportation Needs: Unknown (05/18/2022)    PRAPARE - Transportation     Lack of Transportation (Non-Medical): No   Physical Activity: Inactive (07/14/2021)    Exercise Vital Sign     Days of Exercise per Week: 0 days     Minutes of Exercise per Session: 0 min   Stress: No Stress Concern Present (07/14/2021)    [place]     Feeling of Stress : Not at all   Social Connections: Socially Isolated (07/14/2021)    Social Connection and Isolation Panel [NHANES]     Frequency of Communication with Friends and Family: Once a week     Frequency of Social Gatherings with Friends and Family: Once a week     Attends Religious Services: 1 to 4 times per year     Active Member of [employer] or Organizations: No     Attends Archivist Meetings: Never     Marital Status: Widowed   Intimate Partner Violence: Not At Risk (07/14/2021)    Humiliation, Afraid, Rape, and Kick questionnaire  Fear of Current or Ex-Partner: No     Emotionally Abused: No     Physically Abused: No     Sexually Abused: No   Housing Stability: Unknown (05/18/2022)    Housing Stability Vital Sign     Unstable Housing in the Last Year: No           PHYSICAL EXAM    (up to 7 for level 4, 8 or more for level 5)     ED Triage Vitals [10/25/22 2053]   BP Temp Temp Source Pulse Respirations SpO2 Height Weight - Scale   (!) 145/82 97.3 F (36.3 C) Temporal 94 14 93 % 1.575 m (5\' 2" ) 67.5 kg (148 lb 13 oz)       Body mass index is 27.22 kg/m.    Physical Exam  Vitals and nursing note reviewed.   Constitutional:       Appearance: Normal  appearance.   HENT:      Head: Normocephalic and atraumatic.   Cardiovascular:      Rate and Rhythm: Normal rate.   Pulmonary:      Effort: Pulmonary effort is normal.   Abdominal:      General: Abdomen is flat.   Musculoskeletal:         General: Tenderness (coccyx) present. No deformity. Normal range of motion.   Skin:     General: Skin is warm.      Findings: No erythema.   Neurological:      Mental Status: She is alert and oriented to person, place, and time.      Motor: No weakness.   Psychiatric:         Mood and Affect: Mood normal.         DIAGNOSTIC RESULTS     EKG: All EKG's are interpreted by the Emergency Department Physician who either signs or Co-signs this chart in the absence of a cardiologist.        RADIOLOGY:   Non-plain film images such as CT, Ultrasound and MRI are read by the radiologist. Plain radiographic images are visualized and preliminarily interpreted by the emergency physician with the below findings:        Interpretation per the Radiologist below, if available at the time of this note:    XR SACRUM COCCYX (MIN 2 VIEWS)   Final Result      No fracture. Limited by osteopenia.   Lumbar spine degenerative disease and degenerative subluxations.            CT HEAD WO CONTRAST   Final Result   No acute intracranial abnormality.                 LABS:  Labs Reviewed - No data to display    All other labs were within normal range or not returned as of this dictation.    EMERGENCY DEPARTMENT COURSE and DIFFERENTIAL DIAGNOSIS/MDM:   Vitals:    Vitals:    10/25/22 2053   BP: (!) 145/82   Pulse: 94   Resp: 14   Temp: 97.3 F (36.3 C)   TempSrc: Temporal   SpO2: 93%   Weight: 67.5 kg (148 lb 13 oz)   Height: 1.575 m (5\' 2" )           Medical Decision Making  Hemodynamically stable patient who arrives as above.  CT head unremarkable.  Coccyx x-ray is unremarkable for acute fracture.  Most likely patient has discomfort in this area from contusion but there  is no overt fracture.  Will discharge  patient home with continued use of Tylenol 3 which she uses at home.  Other Motrin as needed.  She can use a donut pillow for comfort and follow-up with PCP on outpatient basis.  Return instructions discussed    Amount and/or Complexity of Data Reviewed  Independent Historian: caregiver  Radiology: ordered. Decision-making details documented in ED Course.            REASSESSMENT            CONSULTS:  None    PROCEDURES:  Unless otherwise noted below, none     Procedures      FINAL IMPRESSION      1. Fall, initial encounter    2. Contusion of coccyx, initial encounter          DISPOSITION/PLAN   DISPOSITION Decision To Discharge 10/25/2022 09:59:08 PM      PATIENT REFERRED TO:  Cathlyn Parsons, MD  [address]  [place] [address]  [phone]    Schedule an appointment as soon as possible for a visit         DISCHARGE MEDICATIONS:  New Prescriptions    No medications on file         (Please note that portions of this note were completed with a voice recognition program.  Efforts were made to edit the dictations but occasionally words are mis-transcribed.)    Myles Rosenthal, DO (electronically signed)  Emergency Attending Physician / Physician Assistant / Nurse Practitioner             Myles Rosenthal, DO  10/25/22 2159

## 2022-10-26 ENCOUNTER — Inpatient Hospital Stay
Admit: 2022-10-26 | Discharge: 2022-10-26 | Disposition: A | Discharge status: Home / Self Care | Payer: MEDICARE | Attending: Emergency Medicine

## 2022-10-26 MED ORDER — TRAMADOL HCL 50 MG PO TABS
50 | Freq: Once | ORAL | Status: AC
Start: 2022-10-26 — End: 2022-10-25
  Administered 2022-10-26: 02:00:00 50 mg via ORAL

## 2022-10-26 MED FILL — TRAMADOL HCL 50 MG PO TABS: 50 MG | ORAL | Qty: 1

## 2022-10-29 ENCOUNTER — Encounter

## 2022-10-30 MED ORDER — ACETAMINOPHEN-CODEINE 300-30 MG PO TABS
300-30 MG | ORAL_TABLET | Freq: Two times a day (BID) | ORAL | 0 refills | Status: AC | PRN
Start: 2022-10-30 — End: 2022-11-29

## 2022-10-30 NOTE — Telephone Encounter (Signed)
Last appointment: 10/10/22  Next appointment: 11/30/22, 12/05/22  Previous refill encounter(s): 05/30/22 #60    Requested Prescriptions     Pending Prescriptions Disp Refills    acetaminophen-codeine (TYLENOL #3) 300-30 MG per tablet [Pharmacy Med Name: ACETAMINOPHEN/COD #3 (300/30MG ) TAB] 60 tablet 0     Sig: Take 1 tablet by mouth every 12 hours as needed for Pain for up to 30 days. Max Daily Amount: 2 tablets         For Pharmacy Admin Tracking Only    Program: Medication Refill  CPA in place:    Recommendation Provided To:   Intervention Detail: New Rx: 1, reason: Patient Preference  Intervention Accepted By:   Jamse Arn Closed?:    Time Spent (min): 5

## 2022-10-31 ENCOUNTER — Encounter: Payer: MEDICARE | Attending: Neurology | Primary: Family Medicine

## 2022-11-01 NOTE — Progress Notes (Signed)
[facility] Orders [id number], [id number], [id number], [id number]     [phone]

## 2022-11-12 NOTE — Telephone Encounter (Signed)
DNR and  AVD scanned in to chart

## 2022-11-15 ENCOUNTER — Ambulatory Visit: Admit: 2022-11-15 | Payer: MEDICARE | Attending: Family Medicine | Primary: Family Medicine

## 2022-11-15 DIAGNOSIS — I10 Essential (primary) hypertension: Secondary | ICD-10-CM

## 2022-11-15 MED ORDER — VALSARTAN 40 MG PO TABS
40 MG | ORAL_TABLET | Freq: Every evening | ORAL | 0 refills | Status: DC
Start: 2022-11-15 — End: 2023-01-15

## 2022-11-15 MED ORDER — CETIRIZINE HCL 5 MG PO TABS
5 | ORAL_TABLET | Freq: Every day | ORAL | 0 refills | Status: DC
Start: 2022-11-15 — End: 2023-02-11

## 2022-11-15 NOTE — Progress Notes (Signed)
Chief Complaint   Patient presents with    Hypertension     Accompanied by: Niece    Last BP 11/14/2022 Pm 150/77 P-81    "Have you been to the ER, urgent care clinic since your last visit?  Hospitalized since your last visit?"    NO    "Have you seen or consulted any other health care providers outside of [facility] since your last visit?"    NO             Vitals:    11/15/22 0830   BP: 134/68   Pulse:    Resp:    SpO2:      Health Maintenance Due   Topic Date Due    Shingles vaccine (1 of 2) Never done    Respiratory Syncytial Virus (RSV) Pregnant or age 52 yrs+ (1 - 1-dose 60+ series) Never done    Pneumococcal 65+ years Vaccine (2 of 2 - PCV) 08/19/2015    Annual Wellness Visit (Medicare)  08/19/2022      The patient, Kimberly [name], identity was verified by name and DOB, pharmacy verified  Labs:Yes  Fasting:No

## 2022-11-15 NOTE — Progress Notes (Signed)
[facility]  [facility]  [phone]. [address]  [place], [place] [address]  [phone]    Chief Complaint: chronic medical problems    Subjective  Kimberly [name] is a 89 y.o. Black / [place] American female , established patient, here for evaluation of the concern(s) below;    PMHx: HTN, PVD, pituitary mirroadenoma S/P surgery (follows with Dr. Arville Lime), DM with peripheral neuropathy, DDD , CKD 3a, Malignant neoplasm of left breast, Memory disturbance (follows with neuro), HLD who presents for follow up of chronic problems;     Accompanied By: Niece Rene Kocher)     HTN:  Pt on valsartan 80mg  daily.  Per caregiver, her BP is within normal limits in the morning but will spike high later in the evening.   Per log, she would sometimes have BP around 180s/100s mmHg later in the evening.  Pt would sometimes complain of headache when BP is high.  Pt gets OT once weekly and PT twice weekly.     Neuropathy:  On Lyrica 200mg  BID for neuropathy managed by neurology.     Uses a Rolator walker when needed     Pt denies any  fever, chill, chest pain, SOB, abdominal pain, n/v/d, HA or dizziness.      Other Health Habits and social history:  Her only daughter passed away.     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       Allergies - reviewed:   Allergies   Allergen Reactions    Penicillins Hives and Myalgia    Sulfa Antibiotics      Other reaction(s): Unknown (comments)       Past Medical History - reviewed:  Past Medical History:   Diagnosis Date    Adverse effect of anesthesia     slow to wake    Anginal pain ([facility])     Arthritis     Breast cancer ([facility]) 03/10/2018     left lumpectomy    Cancer ([facility]) 01/2018    breast    Chronic pain     neuropathy    Constipation     GERD (gastroesophageal reflux disease)     Headache     Hearing loss     High cholesterol     Hypertension     Incontinence     Joint  pain     Memory disorder     Muscle pain     Osteoporosis     Ringing in the ears     Snoring     Visual disturbance        Depression screening:  No data recorded    Review of systems:   A comprehensive review of systems was negative except for that written in the History of Present Illness.     Physical Exam  BP 134/68   Pulse 89   Resp 16   Ht 1.575 m (5\' 2" )   Wt 68.9 kg (151 lb 14.4 oz)   SpO2 93%   BMI 27.78 kg/m     General: Alert and oriented, in no acute distress.  SKIN: No rash. No suspicious lesions or moles.  EYE: PERRL. Sclera and conjuctival clear. Extraocular movements intact.  EARS: External normal, canals clear, tympanic membranes normal.   NOSE: Mucosa healthy without drainage or ulceration.  OROPHARYNX: No suspicious lesions, normal dentition, pharynx, tongue and tonsils normal.  NECK:  Supple; no masses; thyroid normal.  LUNGS: Respirations unlabored; clear to auscultation bilaterally.  CARDIOVASCULAR: Regular, rate, and rhythm without murmurs, gallops or rubs.  Neurological: Alert and oriented X 3, normal strength and tone. Use a rollator walker     Assessment/Plan   Diagnosis Orders   1. Seasonal allergic rhinitis due to pollen  cetirizine (ZYRTEC) 5 MG tablet      2. Primary hypertension  valsartan (DIOVAN) 40 MG tablet          1. Primary hypertension  Will add 40mg  diovan in evening to take if BP >150/90  - valsartan (DIOVAN) 40 MG tablet; Take 1 tablet by mouth nightly    -Continue diovan 80mg  in the morning    2. Seasonal allergic rhinitis due to pollen  - cetirizine (ZYRTEC) 5 MG tablet; Take 1 tablet by mouth daily       Follow up: 3 months. RTC to clinic sooner should symptoms persist, worsen or fail to improve as anticipated.    We discussed the expected course, resolution and complications of the diagnosis(es) in detail.  Medication risks, benefits, costs, interactions, and alternatives were discussed as indicated.  I advised to contact the office if his condition worsens,  changes or fails to improve as anticipated. Pt expressed understanding with the diagnosis(es) and plan. Patient understands that this encounter was a temporary measure, and the importance of further follow up and examination was emphasized.  Patient verbalized understanding.      Signed By: Rene Paci, MD     November 15, 2022

## 2022-11-16 NOTE — Progress Notes (Signed)
[facility] order #[id number] [phone]

## 2022-11-21 ENCOUNTER — Encounter

## 2022-11-22 ENCOUNTER — Encounter

## 2022-11-22 NOTE — Telephone Encounter (Signed)
Diovan was sent to [facility] #[id number] on 11/15/22    For Pharmacy Admin Tracking Only    Program: Medication Refill  CPA in place:    Recommendation Provided To:   Intervention Detail: Discontinued Rx: 1, reason: Duplicate Therapy  Intervention Accepted By:   Eugenia Pancoast Closed?:    Time Spent (min): 5

## 2022-11-22 NOTE — Progress Notes (Signed)
Faxed to:  [facility] order #[id number]  [phone]

## 2022-11-23 NOTE — Telephone Encounter (Signed)
Diovan was sent on 11/15/22 for #90    For Pharmacy Admin Tracking Only    Program: Medication Refill  CPA in place:    Recommendation Provided To:   Intervention Detail: Discontinued Rx: 1, reason: Duplicate Therapy  Intervention Accepted By:   Eugenia Pancoast Closed?:    Time Spent (min): 5

## 2022-11-23 NOTE — Progress Notes (Signed)
Faxed to: [facility]  Anti coag/ Anti platelet info. Fom [phone]

## 2022-11-28 ENCOUNTER — Ambulatory Visit: Admit: 2022-11-28 | Discharge: 2022-11-28 | Payer: MEDICARE | Attending: Surgery | Primary: Family Medicine

## 2022-11-28 DIAGNOSIS — Z171 Estrogen receptor negative status [ER-]: Secondary | ICD-10-CM

## 2022-11-28 NOTE — Progress Notes (Signed)
Identified pt with two pt identifiers (name and DOB). Reviewed chart in preparation for visit and have obtained necessary documentation.    Kimberly [name] is a 89 y.o. female  Chief Complaint   Patient presents with    Follow-up     Lt breast cancer     BP 131/76 (Site: Right Upper Arm, Position: Sitting)   Pulse 94   Temp 98.1 F (36.7 C) (Oral)   Resp 20   Ht 1.575 m ( )   Wt 66.2 kg (146 lb)   SpO2 93%   BMI 26.70 kg/m     1. Have you been to the ER, urgent care clinic since your last visit?  Hospitalized since your last visit?no    2. Have you seen or consulted any other health care providers outside of the [facility] since your last visit?  Include any pap smears or colon screening. no

## 2022-11-28 NOTE — Progress Notes (Signed)
Subjective:      Patient ID: Kimberly [name] is a 89 y.o. female who comes in for breast cancer follow up      Chief Complaint   Patient presents with    Follow-up     Lt breast cancer       HPI     She went for screening mammogram and was noted to have a density in the UOQ of the left breast.  Subsequently she had an [place] confirming a 2.0 x 1.8 x 1.6 cm in the 0100 position 6 cm from nipple.   Core biopsy 01/21/2018 demonstrated a poorly differentiated  carcinoma compatible with IDC G3 triple negative.  She had not felt a lump, skin changes, nipple changes or drainage, unexplained weight loss or bone pain.  She had menarche at 46, only pregnancy at 8, menopause at 89 and did not take HRT.  Her MGM,  M, D, and sister all died from metastatic breast cancer.   She underwent a left lumpectomy for a T2NxMx IDC 03/10/2018.  She received adjuvant WBRT Tery Sanfilippo).  She had a bilateral 3D dx mammogram 06/06/2022 was BIRADS 2.    Past Medical History:   Diagnosis Date    Adverse effect of anesthesia     slow to wake    Anginal pain (HCC)     Arthritis     Breast cancer (HCC) 03/10/2018     left lumpectomy    Cancer (HCC) 01/2018    breast    Chronic pain     neuropathy    Constipation     GERD (gastroesophageal reflux disease)     Headache     Hearing loss     High cholesterol     Hypertension     Incontinence     Joint pain     Memory disorder     Muscle pain     Osteoporosis     Ringing in the ears     Snoring     Visual disturbance      Past Surgical History:   Procedure Laterality Date    BREAST LUMPECTOMY Left 03/10/2018    LEFT BREAST LUMPECTOMY WITH ULTRASOUND GUIDANCE performed by Sabino Snipes, MD at [facility]    HYSTERECTOMY (CERVIX STATUS UNKNOWN)      ORTHOPEDIC SURGERY      epidural pain mtg - pt unsure    OTHER SURGICAL HISTORY Right 10/15/2016    ligament correction right foot    OTHER SURGICAL HISTORY  11/26/2016    Pituitary tumor removal    US BREAST BIOPSY W LOC DEVICE 1ST LESION LEFT Left  01/21/2018    US BREAST NEEDLE BIOPSY LEFT 01/21/2018 [facility] [place]     Family History   Problem Relation Age of Onset    Dementia Sister     Dementia Brother     Cancer Daughter     Breast Cancer Daughter 76    Breast Cancer Sister 62    Breast Cancer Mother 43    No Known Problems Father      Social History     Tobacco Use    Smoking status: Never    Smokeless tobacco: Never   Vaping Use    Vaping Use: Never used   Substance Use Topics    Alcohol use: Not Currently    Drug use: No     Current Outpatient Medications   Medication Sig    valsartan (DIOVAN) 40 MG tablet Take  1 tablet by mouth nightly    cetirizine (ZYRTEC) 5 MG tablet Take 1 tablet by mouth daily    acetaminophen-codeine (TYLENOL #3) 300-30 MG per tablet Take 1 tablet by mouth every 12 hours as needed for Pain for up to 30 days. Max Daily Amount: 2 tablets    valsartan (DIOVAN) 80 MG tablet Take 1 tablet by mouth daily (Patient taking differently: Take 1 tablet by mouth daily (with breakfast))    pregabalin (LYRICA) 200 MG capsule Take 1 capsule by mouth 2 times daily for 180 days. Max Daily Amount: 400 mg    rOPINIRole (REQUIP) 0.25 MG tablet TAKE 1 TABLET BY MOUTH EVERY NIGHT    DULoxetine (CYMBALTA) 30 MG extended release capsule TAKE 1 CAPSULE BY MOUTH DAILY (Patient taking differently: Take 1 capsule by mouth Daily with supper)    donepezil (ARICEPT) 5 MG tablet Take 1 tablet by mouth daily (before dinner)    DULoxetine (CYMBALTA) 60 MG extended release capsule Take 1 capsule by mouth daily (before dinner) (Patient taking differently: Take 1 capsule by mouth daily (with breakfast))    Cholecalciferol (VITAMIN D3) 50 MCG (2000 UT) CAPS Take 1 capsule by mouth daily    ammonium lactate (LAC-HYDRIN) 12 % lotion Apply 1 Application topically 2 times daily as needed    ascorbic acid (VITAMIN C) 100 MG tablet Take 1 tablet by mouth daily    acetaminophen (TYLENOL) 500 MG tablet Take by mouth every 6 hours as needed    aspirin 81 MG chewable tablet Take by  mouth daily     No current facility-administered medications for this visit.     Allergies   Allergen Reactions    Penicillins Hives and Myalgia    Sulfa Antibiotics      Other reaction(s): Unknown (comments)         Review of Systems   Constitutional:  Negative for chills, diaphoresis, fatigue, fever and unexpected weight change.   HENT:  Positive for hearing loss. Negative for congestion, ear pain and sore throat.    Eyes:  Positive for visual disturbance. Negative for pain.   Respiratory:  Negative for cough, shortness of breath, wheezing and stridor.    Cardiovascular:  Negative for chest pain, palpitations and leg swelling.   Gastrointestinal:  Positive for constipation. Negative for abdominal pain, blood in stool, diarrhea, nausea and vomiting.   Endocrine: Negative for polydipsia.   Genitourinary:  Negative for dysuria, flank pain, frequency, hematuria and urgency.   Musculoskeletal:  Positive for arthralgias, gait problem (uses walker) and myalgias. Negative for back pain.   Neurological:  Positive for numbness (finger tips). Negative for dizziness, seizures, weakness and headaches.   Psychiatric/Behavioral:  Positive for dysphoric mood. Negative for behavioral problems. The patient is nervous/anxious.          BP 131/76 (Site: Right Upper Arm, Position: Sitting)   Pulse 94   Temp 98.1 F (36.7 C) (Oral)   Resp 20   Ht 1.575 m (5\' 2" )   Wt 66.2 kg (146 lb)   SpO2 93%   BMI 26.70 kg/m     Objective:   Physical Exam  Vitals and nursing note reviewed. Exam conducted with a chaperone present.   Constitutional:       General: She is not in acute distress.     Appearance: Normal appearance. She is well-developed. She is not ill-appearing or diaphoretic.   HENT:      Head: Normocephalic and atraumatic.   Eyes:  General: No scleral icterus.     Extraocular Movements: Extraocular movements intact.      Conjunctiva/sclera: Conjunctivae normal.      Pupils: Pupils are equal, round, and reactive to light.    Neck:      Thyroid: No thyroid mass or thyromegaly.      Trachea: Trachea and phonation normal. No tracheal deviation.   Cardiovascular:      Rate and Rhythm: Normal rate and regular rhythm.      Heart sounds: Normal heart sounds. No murmur heard.     No friction rub. No gallop.   Pulmonary:      Effort: Pulmonary effort is normal. No respiratory distress.      Breath sounds: Normal breath sounds. No stridor. No wheezing or rales.   Chest:   Breasts:     Breasts are asymmetrical.      Right: No inverted nipple, mass, nipple discharge, skin change or tenderness.      Left: No inverted nipple, mass, nipple discharge, skin change or tenderness.       Abdominal:      General: Bowel sounds are normal. There is no distension.      Palpations: There is no mass.      Tenderness: There is no abdominal tenderness. There is no guarding or rebound.      Hernia: No hernia is present.   Musculoskeletal:         General: No swelling or tenderness. Normal range of motion.      Cervical back: Normal range of motion and neck supple.   Lymphadenopathy:      Cervical: No cervical adenopathy.      Upper Body:      Right upper body: No supraclavicular or axillary adenopathy.      Left upper body: No supraclavicular or axillary adenopathy.   Skin:     Coloration: Skin is not jaundiced.      Findings: No erythema or rash.   Neurological:      Mental Status: She is alert and oriented to person, place, and time.      Cranial Nerves: No cranial nerve deficit.      Coordination: Coordination normal.      Gait: Gait normal.   Psychiatric:         Behavior: Behavior normal.         Thought Content: Thought content normal.         Judgment: Judgment normal.             Assessment / Plan:       1.  Left breast cancer early stage, Clinically stage 2A (T2N0M0). Triple negative Dx 01/2018.   NED at 6 years and 10 months   2.  Strong family hx breast cancer      3.  Allergy to PCN with hives and upset stomach.  Ok for Ancef   4.  Essential  hypertension.  On rx   5.  COVID-19.  Got first dose of vaccine   6.  Social.  Sleeps a lot   Here with with niece today   Lives with niece and has an aide that comes to the house      We discussed stopping mammograms but she wishes to continue with them     Discussed that given advanced age will stop mammograms and she agreed      RTC 1 year      Sabino Snipes, MD FACS

## 2022-11-29 NOTE — Telephone Encounter (Signed)
Kimberly [name] PT with [facility] reporting that patient missed a PT visit on 11/10/22. No further questions or concerns.Kimberly [name] can be reached at [phone] if needed. FYI

## 2022-11-30 NOTE — Telephone Encounter (Signed)
Noted.  Dr Doyce Para made aware

## 2022-12-05 ENCOUNTER — Ambulatory Visit: Payer: MEDICARE | Attending: Family Medicine | Primary: Family Medicine

## 2022-12-05 NOTE — Progress Notes (Signed)
[facility] order #[id number]

## 2022-12-07 NOTE — Progress Notes (Signed)
CARE TEAM:  Patient Care Team:  Otila Kluver, MD as PCP - General (Family Medicine)    ASSESSMENT     1. Cervical radiculopathy    2. Cervical spondylosis    3. Foraminal stenosis of cervical region       Patient Active Problem List   Diagnosis   . Acute CVA (cerebrovascular accident)   . Bilateral carotid artery stenosis   . Cerebral microvascular disease   . Cervical radiculopathy due to degenerative joint disease of spine   . Chest pain with normal angiography   . Chronic renal disease, stage III   . Degenerative cervical spinal stenosis   . 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 Diabetic peripheral neuropathy associated with type 2 diabetes mellitus   . High cholesterol   . History of surgical removal of pituitary gland   . Hypertension, essential, benign   . Idiopathic small fiber peripheral neuropathy   . Lumbosacral radiculopathy due to degenerative joint disease of spine   . Memory loss   . Primary insomnia   . PVD (peripheral vascular disease)   . SOB (shortness of breath) on exertion   . Tension vascular headache       HISTORY OF PRESENT ILLNESS   Chief Complaint: Pain of the Neck   Age: 89 y.o.  Sex: female     History of present illness: Ms. Schwarzkopf is a 89 y.o. female who presents to the procedure room today for an epidural steroid injection.      PROCEDURES   ILESI C7-T1      Performed by: Posey Pronto, MD  Authorized by: Posey Pronto, MD      Consent Given by:  Patient  Site marked: the procedure site was marked    Timeout: prior to procedure the correct patient, procedure, and site was verified    Preparation:  Skin prepped with ChloraPrep  Procedure:  ILESI  Indications:  Diagnostic evaluation and therapeutic benefit  Guidance:  Fluoroscopy  Levels:  C7-T1  Site(s):     Additional Procedural Details:  The patient was given the opportunity to ask questions regarding the procedure, its indications and associated risks. The risks of the procedure  discussed including infection, bleeding, allergic reaction, headache, nerve injuries, paralysis, and cardiovascular and CNS side effects with possible vascular entry of medications.  In addition, possible side effects or reactions to the medications potentially used during the procedure were discussed with the patient. The patient was informed both verbally and in writing. The patient understood the informed consent and desired to have the procedure performed.    After consent was obtained was obtained, the patient was placed in the prone position on the treatment table. Pillow were placed under the patients chest to increase flexion of the neck. A "time out" was performed. The skin overlying the posterior cervical spine was prepped wand draped in usual fashion. Fluoroscopic guidance was used to locate the anatomic landmarks for an approach to the C7-T1 interspace. The soft tissue was anesthetized with Lidocaine 1 %.  Using Fluoroscopic guidance, a 20-gauge Touey needle was advanced and the needle was directed slightly lateral to midline on the symptomatic side. The image intensifier was then rotated and the Contralateral Oblique View was used to direct the needle toward the spinolaminar line. The needle was then advanced through the ligamentum flavum. A loss of resistance syringe was used to confirm access into the posterior epidural space. Contrast was then injected demonstrating epidural  flow without vascular uptake. Epidural flow was confirmed in the AP view.  I then injected the patient with a 3mL solution containing 1mL 10mg  Dexamethasone and 2mL normal saline. All pertinent procedural images were saved in the PACS system. The post contrast images in the AP and Contralateral Oblique views were saved to show a successful epidurogram.      Vitals were monitored throughout the procedure.  After the procedure they were taken back to recovery where their vitals were again monitored. Post procedure pain was  reduced. They were discharged in stable condition.       Medication Administered: 1 mL dexAMETHasone Sod Phosphate PF 10 MG/ML; 1 mL iohexol 300 MG/ML    Needle Size:  20 G  Needle Length:  3.5 in  PreservativeFree and Particulate Free    Pt Tolerated Procedure:  Well and no immediate complications           No orders of the defined types were placed in this encounter.

## 2022-12-17 DIAGNOSIS — Z043 Encounter for examination and observation following other accident: Secondary | ICD-10-CM

## 2022-12-17 NOTE — ED Triage Notes (Signed)
Pt states she was getting out of car around 2030 and fell backwards hitting her head. Pain to head, bilateral leg pain, lower back pain and diffuse abd pain    Denies LOC.     Is on ASA

## 2022-12-17 NOTE — Telephone Encounter (Signed)
Requip was sent on 09/16/22 for #[id number] with 1 refill - too soon    For Pharmacy Admin Tracking Only    Program: Medication Refill  CPA in place:    Recommendation Provided To:   Intervention Detail: Discontinued Rx: 1, reason: Duplicate Therapy  Intervention Accepted By:   Eugenia Pancoast Closed?:    Time Spent (min): 5

## 2022-12-17 NOTE — ED Provider Notes (Addendum)
[facility] EMERGENCY DEP  EMERGENCY DEPARTMENT ENCOUNTER      Pt Name: Kimberly [name]  MRN: [id number]  Birthdate March 24, 1924  Date of evaluation: 12/17/2022  Provider: Minda Ditto, DO    CHIEF COMPLAINT       Chief Complaint   Patient presents with    Fall         HISTORY OF PRESENT ILLNESS   (Location/Symptom, Timing/Onset, Context/Setting, Quality, Duration, Modifying Factors, Severity)  Note limiting factors.   89 year old female history of breast cancer, GERD, hypertension, hyperlipidemia presenting today with both abdominal pain and a ground-level fall.  Patient is a poor historian.  Falls frequently.  Did not wait for her caretaker to help her get out of the car today and fell backwards, striking her head.  Reports pain to the back of her legs as well as pain to the back of her head.  Denies neck pain or upper back pain.  Denies chest pain or shortness of breath.  She also does report abdominal pain.  No vomiting.  Cannot tell me when the abdominal pain started.  Her caretaker states that it is not usual for her to have abdominal discomfort like this.            Review of External Medical Records:     Nursing Notes were reviewed.    REVIEW OF SYSTEMS    (2-9 systems for level 4, 10 or more for level 5)     Review of Systems   Gastrointestinal:  Positive for abdominal pain.   Neurological:  Positive for headaches.       Except as noted above the remainder of the review of systems was reviewed and negative.       PAST MEDICAL HISTORY     Past Medical History:   Diagnosis Date    Adverse effect of anesthesia     slow to wake    Anginal pain (HCC)     Arthritis     Breast cancer (HCC) 03/10/2018     left lumpectomy    Cancer (HCC) 01/2018    breast    Chronic pain     neuropathy    Constipation     GERD (gastroesophageal reflux disease)     Headache     Hearing loss     High cholesterol     Hypertension     Incontinence     Joint pain     Memory disorder     Muscle pain     Osteoporosis     Ringing in the  ears     Snoring     Visual disturbance          SURGICAL HISTORY       Past Surgical History:   Procedure Laterality Date    BREAST LUMPECTOMY Left 03/10/2018    LEFT BREAST LUMPECTOMY WITH ULTRASOUND GUIDANCE performed by Sabino Snipes, MD at [facility]    HYSTERECTOMY (CERVIX STATUS UNKNOWN)      ORTHOPEDIC SURGERY      epidural pain mtg - pt unsure    OTHER SURGICAL HISTORY Right 10/15/2016    ligament correction right foot    OTHER SURGICAL HISTORY  11/26/2016    Pituitary tumor removal    US BREAST BIOPSY W LOC DEVICE 1ST LESION LEFT Left 01/21/2018    US BREAST NEEDLE BIOPSY LEFT 01/21/2018 [facility] [place]         CURRENT MEDICATIONS  Previous Medications    ACETAMINOPHEN (TYLENOL) 500 MG TABLET    Take by mouth every 6 hours as needed    AMMONIUM LACTATE (LAC-HYDRIN) 12 % LOTION    Apply 1 Application topically 2 times daily as needed    ASCORBIC ACID (VITAMIN C) 100 MG TABLET    Take 1 tablet by mouth daily    ASPIRIN 81 MG CHEWABLE TABLET    Take by mouth daily    CETIRIZINE (ZYRTEC) 5 MG TABLET    Take 1 tablet by mouth daily    CHOLECALCIFEROL (VITAMIN D3) 50 MCG (2000 UT) CAPS    Take 1 capsule by mouth daily    DONEPEZIL (ARICEPT) 5 MG TABLET    Take 1 tablet by mouth daily (before dinner)    DULOXETINE (CYMBALTA) 30 MG EXTENDED RELEASE CAPSULE    TAKE 1 CAPSULE BY MOUTH DAILY    DULOXETINE (CYMBALTA) 60 MG EXTENDED RELEASE CAPSULE    Take 1 capsule by mouth daily (before dinner)    PREGABALIN (LYRICA) 200 MG CAPSULE    Take 1 capsule by mouth 2 times daily for 180 days. Max Daily Amount: 400 mg    ROPINIROLE (REQUIP) 0.25 MG TABLET    TAKE 1 TABLET BY MOUTH EVERY NIGHT    VALSARTAN (DIOVAN) 40 MG TABLET    Take 1 tablet by mouth nightly    VALSARTAN (DIOVAN) 80 MG TABLET    Take 1 tablet by mouth daily       ALLERGIES     Penicillins and Sulfa antibiotics    FAMILY HISTORY       Family History   Problem Relation Age of Onset    Dementia Sister     Dementia Brother     Cancer Daughter      Breast Cancer Daughter 98    Breast Cancer Sister 58    Breast Cancer Mother 72    No Known Problems Father           SOCIAL HISTORY       Social History     Socioeconomic History    Marital status: Widowed   Tobacco Use    Smoking status: Never    Smokeless tobacco: Never   Vaping Use    Vaping Use: Never used   Substance and Sexual Activity    Alcohol use: Not Currently    Drug use: No     Social Determinants of Health     Financial Resource Strain: Low Risk  (05/18/2022)    Overall Financial Resource Strain (CARDIA)     Difficulty of Paying Living Expenses: Not hard at all   Transportation Needs: Unknown (05/18/2022)    PRAPARE - Transportation     Lack of Transportation (Non-Medical): No   Physical Activity: Inactive (07/14/2021)    Exercise Vital Sign     Days of Exercise per Week: 0 days     Minutes of Exercise per Session: 0 min   Stress: No Stress Concern Present (07/14/2021)    [employer] of Occupational Health - Occupational Stress Questionnaire     Feeling of Stress : Not at all   Social Connections: Socially Isolated (07/14/2021)    Social Connection and Isolation Panel [NHANES]     Frequency of Communication with Friends and Family: Once a week     Frequency of Social Gatherings with Friends and Family: Once a week     Attends Religious Services: 1 to 4 times per year     Active  Member of Clubs or Organizations: No     Attends Banker Meetings: Never     Marital Status: Widowed   Intimate Partner Violence: Not At Risk (07/14/2021)    Humiliation, Afraid, Rape, and Kick questionnaire     Fear of Current or Ex-Partner: No     Emotionally Abused: No     Physically Abused: No     Sexually Abused: No   Housing Stability: Unknown (05/18/2022)    Housing Stability Vital Sign     Unstable Housing in the Last Year: No           PHYSICAL EXAM    (up to 7 for level 4, 8 or more for level 5)     ED Triage Vitals [12/17/22 2222]   BP Temp Temp Source Pulse Respirations SpO2 Height Weight - Scale    134/79 97.5 F (36.4 C) Temporal 89 18 95 % 1.575 m (5\' 2" ) 67.7 kg (149 lb 4 oz)       Body mass index is 27.3 kg/m.    Physical Exam  Vitals and nursing note reviewed.   Constitutional:       General: She is not in acute distress.     Appearance: Normal appearance. She is not ill-appearing.   HENT:      Head: Normocephalic and atraumatic.      Nose: Nose normal.      Mouth/Throat:      Mouth: Mucous membranes are moist.   Eyes:      Extraocular Movements: Extraocular movements intact.      Pupils: Pupils are equal, round, and reactive to light.   Cardiovascular:      Rate and Rhythm: Normal rate and regular rhythm.      Pulses: Normal pulses.      Heart sounds: Normal heart sounds. No murmur heard.     Comments: Equal radial, dp, pt pulses  Pulmonary:      Effort: Pulmonary effort is normal. No respiratory distress.      Breath sounds: Normal breath sounds.   Abdominal:      General: There is no distension.      Palpations: Abdomen is soft.      Tenderness: There is abdominal tenderness (guarding, epigastric).   Musculoskeletal:      Cervical back: Normal range of motion and neck supple.      Right lower leg: No edema.      Left lower leg: No edema.      Comments: No C/T/L spine tenderness  No chest wall tenderness, no crepitus, no flail segment  Upper and lower extremities fully ranged, visualized, and palpated without tenderness or deformity.   No snuff box tenderness or pain with axial loading of the thumb.  The hips are non-tender with bilateral compression  Pelvis is stable  Bilateral leg braces in place     Skin:     General: Skin is warm and dry.      Capillary Refill: Capillary refill takes less than 2 seconds.   Neurological:      General: No focal deficit present.      Mental Status: She is alert and oriented to person, place, and time.   Psychiatric:         Mood and Affect: Mood normal.         Behavior: Behavior normal.         DIAGNOSTIC RESULTS     EKG: All EKG's are interpreted by the Emergency  Department  Physician who either signs or Co-signs this chart in the absence of a cardiologist.        RADIOLOGY:   Interpretation per the Radiologist below, if available at the time of this note:    CT ABDOMEN PELVIS W IV CONTRAST Additional Contrast? None   Final Result   No acute findings. Incidentals as above including coronary artery   disease and colonic diverticulosis.      CT HEAD WO CONTRAST   Final Result   No acute findings. 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      CT CERVICAL SPINE WO CONTRAST   Final Result   No fracture or other acute findings. Chronic degenerative changes as   described previously.           LABS:  Labs Reviewed   CBC WITH AUTO DIFFERENTIAL - Abnormal; Notable for the following components:       Result Value    Hemoglobin 11.4 (*)     MCV 73.2 (*)     MCH 22.3 (*)     RDW 20.3 (*)     Eosinophils % 9 (*)     Eosinophils Absolute 0.5 (*)     All other components within normal limits   LIPASE - Abnormal; Notable for the following components:    Lipase 355 (*)     All other components within normal limits   URINALYSIS WITH MICROSCOPIC - Abnormal; Notable for the following components:    Blood, Urine SMALL (*)     Leukocyte Esterase, Urine SMALL (*)     All other components within normal limits   BASIC METABOLIC PANEL - Abnormal; Notable for the following components:    Glucose 111 (*)     BUN 27 (*)     Bun/Cre Ratio 35 (*)     All other components within normal limits   HEPATIC FUNCTION PANEL - Abnormal; Notable for the following components:    Albumin 3.0 (*)     Globulin 4.7 (*)     Albumin/Globulin Ratio 0.6 (*)     All other components within normal limits   URINE CULTURE HOLD SAMPLE   TROPONIN   EXTRA TUBES HOLD       All other labs were within normal range or not returned as of this dictation.          COURSE/REASSESSMENT     ED Course as of 12/18/22 0351   Mon Dec 17, 2022   2248 EKG time 2342  Normal sinus rhythm rate of 89 with narrow QRS, normal QTc, normal axis, T  wave inversions inferiorly with nonspecific ST-T wave changes otherwise, no ST elevations or depressions [CC]      ED Course User Index  [CC] March Steyer E, DO           CONSULTS:  None    PROCEDURES:  Unless otherwise noted below, none     Procedures      DIFFERENTIAL DIAGNOSIS/MDM:   Medical Decision Making  Patient is a 89 year old female here today with 2 issues.  Her first is a ground-level fall which was mechanical in nature.  She did hit her head and is not on blood thinners other than aspirin.  CT head and C-spine were negative for acute process.  Also complaining of abdominal pain and bilateral leg pain.  On exam she has full range of motion of her knees and hips without reproducible pain in the legs, no imaging performed.  CT of her abdomen  showed no acute process to cause her pain although her labs show lipase which is markedly elevated at 355.  No history of pancreatitis.  No alcohol use.  No vomiting.  No other LFTs look okay.  Given her lipase is 4 times greater than the upper limit of normal I feel this warrants further evaluation/workup especially in the setting of her having abdominal pain.  Will discuss with hospitalist team.    Problems Addressed:  Ground-level fall: acute illness or injury    Amount and/or Complexity of Data Reviewed  Labs: ordered. Decision-making details documented in ED Course.  Radiology: ordered. Decision-making details documented in ED Course.    Risk  Decision regarding hospitalization.          FINAL IMPRESSION      1. Ground-level fall    2. Acute pancreatitis, unspecified complication status, unspecified pancreatitis type          DISPOSITION/PLAN   DISPOSITION Decision To Admit 12/18/2022 03:50:34 AM    Perfect Serve Consult for Admission  3:54 AM    ED Room Number: B/HB  Patient Name and age:  BRANDY MULLER 89 y.o.  female  Working Diagnosis:   1. Ground-level fall    2. Acute pancreatitis, unspecified complication status, unspecified pancreatitis type         COVID-19 Suspicion: No  Sepsis present:  No  Reassessment needed: No  Code Status:  Full Code  Readmission: No  Isolation Requirements: no  Recommended Level of Care: med/surg  Department: [facility] Adult ED - [phone]  89 y.o. female here with ground level fall--no injuries found, neg imaging. Also with epigastric pain found to have lipase 355, no hx pancreatitis before.         (Please note that portions of this note were completed with a voice recognition program.  Efforts were made to edit the dictations but occasionally words are mis-transcribed.)    Tranika Scholler E Flower Franko, DO (electronically signed)  Emergency Attending Physician         Patient seen by hospitalist service. Didn't feel indication for admit, please see Dr. Criss Rosales consult note. Pt discharged and comfortable with plan. Tolerating PO.       Mostafa Yuan E, DO  12/18/22 0354       Kwana Ringel E, DO  12/18/22 8469

## 2022-12-17 NOTE — ED Provider Notes (Signed)
10:24 PM  I have evaluated the patient as the Provider in Rapid Medical Evaluation (RME). I have reviewed her vital signs and the triage nurse assessment. I have talked with the patient and any available family and advised that I am the provider in triage and have ordered the appropriate study to initiate their work up based on the clinical presentation during my assessment. I have advised that the patient will be accommodated in the Main ED as soon as possible. I have also requested to contact the triage nurse or myself immediately if the patient experiences any changes in their condition during this brief waiting period.    8:30PM.  Larey Seat backward getting out of the car.  Struck the back of her head.  Takes ASA.  Also reports leg pain and abdominal pain.  New since the fall.      Delorse Limber, PA       Burnis Kingfisher [place], [place]  12/17/22 2226

## 2022-12-18 ENCOUNTER — Emergency Department: Admit: 2022-12-18 | Payer: MEDICARE | Primary: Family Medicine

## 2022-12-18 ENCOUNTER — Encounter

## 2022-12-18 ENCOUNTER — Inpatient Hospital Stay
Admit: 2022-12-18 | Discharge: 2022-12-18 | Disposition: A | Discharge status: Home / Self Care | Payer: MEDICARE | Attending: Student in an Organized Health Care Education/Training Program

## 2022-12-18 DIAGNOSIS — W1830XA Fall on same level, unspecified, initial encounter: Secondary | ICD-10-CM

## 2022-12-18 LAB — BASIC METABOLIC PANEL
Anion Gap: 5 mmol/L (ref 5–15)
BUN/Creatinine Ratio: 35 — ABNORMAL HIGH (ref 12–20)
BUN: 27 MG/DL — ABNORMAL HIGH (ref 6–20)
CO2: 25 mmol/L (ref 21–32)
Calcium: 9.7 MG/DL (ref 8.5–10.1)
Chloride: 108 mmol/L (ref 97–108)
Creatinine: 0.78 MG/DL (ref 0.55–1.02)
Est, Glom Filt Rate: 69 mL/min/{1.73_m2} (ref >60)
Glucose: 111 mg/dL — ABNORMAL HIGH (ref 65–100)
Potassium: 4.3 mmol/L (ref 3.5–5.1)
Sodium: 138 mmol/L (ref 136–145)

## 2022-12-18 LAB — CBC WITH AUTO DIFFERENTIAL
Basophils %: 1 % (ref 0–1)
Basophils Absolute: 0.1 10*3/uL (ref 0.0–0.1)
Eosinophils %: 9 % — ABNORMAL HIGH (ref 0–7)
Eosinophils Absolute: 0.5 10*3/uL — ABNORMAL HIGH (ref 0.0–0.4)
Hematocrit: 37.4 % (ref 35.0–47.0)
Hemoglobin: 11.4 g/dL — ABNORMAL LOW (ref 11.5–16.0)
Immature Granulocytes %: 0 % (ref 0.0–0.5)
Immature Granulocytes Absolute: 0 10*3/uL (ref 0.00–0.04)
Lymphocytes %: 19 % (ref 12–49)
Lymphocytes Absolute: 1 10*3/uL (ref 0.8–3.5)
MCH: 22.3 PG — ABNORMAL LOW (ref 26.0–34.0)
MCHC: 30.5 g/dL (ref 30.0–36.5)
MCV: 73.2 FL — ABNORMAL LOW (ref 80.0–99.0)
Monocytes %: 13 % (ref 5–13)
Monocytes Absolute: 0.7 10*3/uL (ref 0.0–1.0)
Neutrophils %: 58 % (ref 32–75)
Neutrophils Absolute: 2.8 10*3/uL (ref 1.8–8.0)
Nucleated RBCs: 0 PER 100 WBC
Platelets: 254 10*3/uL (ref 150–400)
RBC: 5.11 M/uL (ref 3.80–5.20)
RDW: 20.3 % — ABNORMAL HIGH (ref 11.5–14.5)
WBC: 5.1 10*3/uL (ref 3.6–11.0)
nRBC: 0 10*3/uL (ref 0.00–0.01)

## 2022-12-18 LAB — URINALYSIS WITH MICROSCOPIC
BACTERIA, URINE: NEGATIVE /hpf
Bilirubin, Urine: NEGATIVE
Glucose, Ur: NEGATIVE mg/dL
Ketones, Urine: NEGATIVE mg/dL
Nitrite, Urine: NEGATIVE
Protein, UA: NEGATIVE mg/dL
Specific Gravity, UA: 1.024 (ref 1.003–1.030)
Urobilinogen, Urine: 0.2 EU/dL (ref 0.2–1.0)
pH, Urine: 5.5 (ref 5.0–8.0)

## 2022-12-18 LAB — HEPATIC FUNCTION PANEL
ALT: 25 U/L (ref 12–78)
AST: 18 U/L (ref 15–37)
Albumin/Globulin Ratio: 0.6 — ABNORMAL LOW (ref 1.1–2.2)
Albumin: 3 g/dL — ABNORMAL LOW (ref 3.5–5.0)
Alk Phosphatase: 112 U/L (ref 45–117)
Bilirubin, Direct: <0.1 MG/DL (ref 0.0–0.2)
Globulin: 4.7 g/dL — ABNORMAL HIGH (ref 2.0–4.0)
Total Bilirubin: 0.3 MG/DL (ref 0.2–1.0)
Total Protein: 7.7 g/dL (ref 6.4–8.2)

## 2022-12-18 LAB — EXTRA TUBES HOLD

## 2022-12-18 LAB — URINE CULTURE HOLD SAMPLE

## 2022-12-18 LAB — LIPASE: Lipase: 355 U/L — ABNORMAL HIGH (ref 13–75)

## 2022-12-18 LAB — TROPONIN: Troponin, High Sensitivity: 10 ng/L (ref 0–51)

## 2022-12-18 MED ORDER — IOPAMIDOL 76 % IV SOLN
76 | Freq: Once | INTRAVENOUS | Status: AC | PRN
Start: 2022-12-18 — End: 2022-12-18
  Administered 2022-12-18: 05:00:00 100 mL via INTRAVENOUS

## 2022-12-18 MED FILL — ISOVUE-370 76 % IV SOLN: 76 % | INTRAVENOUS | Qty: 100

## 2022-12-18 NOTE — Consults (Signed)
[name]    Hospitalist Consult  Primary Care Provider: Jonnie Kind, MD  Consult requested by: and chart review    Subjective:     Kimberly [name] is a 89 y.o. female with past medical history as listed below who presented to ED for evaluation after fall.  She had been in her usual state of health and was heading to dinner when she lost her balance and fell backwards.  There is report of her hitting the back of her head but now loss of consciousness.  She was brought to the emergency room for evaluation.  While she was in the emergency room she reported some vague aching epigastric pain and as a result had CT scan of her abdomen and pelvis which was unremarkable.  Subsequently had lipase level checked which was in the 300s.  Medicine is consulted to evaluate her for admission for possible pancreatitis.  Patient is seen and examined at bedside.  She feels well.  She describes some epigastric discomfort but does not really qualify this as pain and states overall it does not bother her.  She has had this now for about 3 days.  She has had no change in her bowel movements.  She continues to tolerate her usual diet and feels well.    He/She denies any headaches or dizziness. Denies any cough, chest pain, shortness of breath. Denies fever or chills. Denies nausea, vomiting, diarrhea, constipation.     Review of Systems:    Pertinent items are noted in the History of Present Illness.     Past Medical History:   Diagnosis Date    Adverse effect of anesthesia     slow to wake    Anginal pain (HCC)     Arthritis     Breast cancer (HCC) 03/10/2018     left lumpectomy    Cancer (HCC) 01/2018    breast    Chronic pain     neuropathy    Constipation     GERD (gastroesophageal reflux disease)     Headache     Hearing loss     High cholesterol     Hypertension     Incontinence     Joint pain     Memory disorder     Muscle pain     Osteoporosis     Ringing in the ears     Snoring      Visual disturbance       Past Surgical History:   Procedure Laterality Date    BREAST LUMPECTOMY Left 03/10/2018    LEFT BREAST LUMPECTOMY WITH ULTRASOUND GUIDANCE performed by Sabino Snipes, MD at [facility]    HYSTERECTOMY (CERVIX STATUS UNKNOWN)      ORTHOPEDIC SURGERY      epidural pain mtg - pt unsure    OTHER SURGICAL HISTORY Right 10/15/2016    ligament correction right foot    OTHER SURGICAL HISTORY  11/26/2016    Pituitary tumor removal    US BREAST BIOPSY W LOC DEVICE 1ST LESION LEFT Left 01/21/2018    US BREAST NEEDLE BIOPSY LEFT 01/21/2018 [facility] [place]     Prior to Admission medications    Medication Sig Start Date End Date Taking? Authorizing Provider   vitamin E 1000 units capsule Take 1 capsule by mouth daily   Yes [provider]   valsartan (DIOVAN) 40 MG tablet Take 1 tablet by mouth nightly 11/15/22  Jonnie Kind, MD   cetirizine (ZYRTEC) 5 MG tablet Take 1 tablet by mouth daily 11/15/22   Jonnie Kind, MD   valsartan (DIOVAN) 80 MG tablet Take 1 tablet by mouth daily  Patient taking differently: Take 1 tablet by mouth daily (with breakfast) 10/10/22   Jonnie Kind, MD   pregabalin (LYRICA) 200 MG capsule Take 1 capsule by mouth 2 times daily for 180 days. Max Daily Amount: 400 mg 09/18/22 03/17/23  Youlanda Roys, MD   rOPINIRole (REQUIP) 0.25 MG tablet TAKE 1 TABLET BY MOUTH EVERY NIGHT 09/16/22   Jonnie Kind, MD   DULoxetine (CYMBALTA) 30 MG extended release capsule TAKE 1 CAPSULE BY MOUTH DAILY  Patient taking differently: Take 1 capsule by mouth Daily with supper 09/13/22   Youlanda Roys, MD   donepezil (ARICEPT) 5 MG tablet Take 1 tablet by mouth daily (before dinner) 08/21/22   Youlanda Roys, MD   DULoxetine (CYMBALTA) 60 MG extended release capsule Take 1 capsule by mouth daily (before dinner)  Patient taking differently: Take 1 capsule by mouth daily (with breakfast) 07/25/22   Youlanda Roys, MD   Cholecalciferol (VITAMIN D3) 50 MCG (2000 UT) CAPS Take  1 capsule by mouth daily    [provider]   ammonium lactate (LAC-HYDRIN) 12 % lotion Apply 1 Application topically 2 times daily as needed 03/29/22   [provider]   ascorbic acid (VITAMIN C) 100 MG tablet Take 1 tablet by mouth daily    [provider]   acetaminophen (TYLENOL) 500 MG tablet Take by mouth every 6 hours as needed    Automatic Reconciliation, Ar   aspirin 81 MG chewable tablet Take by mouth daily    Automatic Reconciliation, Ar     Allergies   Allergen Reactions    Penicillins Hives and Myalgia    Sulfa Antibiotics      Other reaction(s): Unknown (comments)      Family History   Problem Relation Age of Onset    Dementia Sister     Dementia Brother     Cancer Daughter     Breast Cancer Daughter 44    Breast Cancer Sister 52    Breast Cancer Mother 52    No Known Problems Father         SOCIAL HISTORY:  Patient resides at Home.  Patient ambulates with independently.   Smoking history: no  Alcohol history: none    Objective:       Physical Exam:   General appearance: alert, appears stated age, and cooperative  Neck: no adenopathy, no carotid bruit, no JVD, supple, symmetrical, trachea midline, and thyroid not enlarged, symmetric, no tenderness/mass/nodules  Lungs: clear to auscultation bilaterally  Abdomen: soft, non-tender; bowel sounds normal; no masses,  no organomegaly  Extremities: extremities normal, atraumatic, no cyanosis or edema    Data Review:   All diagnostic labs and studies have been reviewed.    CT abdomen and pelvis showed no acute process  Assessment:   Kimberly [name] is a 89 y.o. female with past medical history as listed below who presents to hospital for evaluation after ground-level fall.  Medicine consulted to evaluate patient for possible acute pancreatitis.    Plan:     1.  Elevated lipase  -Lipase level of 355 with previous lipase level of 226  -Patient with no current clinical signs and symptoms of acute pancreatitis requiring inpatient  admission  -Patient has had no  change in her eating habits and tolerates a regular diet.  -She was fed some peanut butter crackers and ginger ale in ED and tolerated this well  -Patient and her caregiver are eager to go home and I think this is appropriate  -We discussed signs and symptoms of serious pancreatitis and when to return to ED  -Advised that over the next 3 to 5 days, she avoid heavy and greasy foods and ensure adequate oral hydration.  -Advised follow-up with her PCP in about 3 days or return to ED if she has any abdominal pain, nausea and vomiting or inability to tolerate any food  -Patient can be discharged home from a medicine standpoint.      Signed By: Ellsworth Lennox, MD     Date of Service:  12/18/2022

## 2022-12-19 MED ORDER — ACETAMINOPHEN-CODEINE 300-30 MG PO TABS
300-30 | ORAL_TABLET | Freq: Two times a day (BID) | ORAL | 0 refills | Status: DC | PRN
Start: 2022-12-19 — End: 2023-03-20

## 2022-12-19 NOTE — Telephone Encounter (Signed)
Last appointment: 11/15/22  Next appointment: 02/14/23  Previous refill encounter(s): 10/30/22 #60    Requested Prescriptions     Pending Prescriptions Disp Refills    acetaminophen-codeine (TYLENOL #3) 300-30 MG per tablet [Pharmacy Med Name: ACETAMINOPHEN/COD #3 (300/30MG ) TAB] 60 tablet 0     Sig: Take 1 tablet by mouth every 12 hours as needed for Pain for up to 30 days. Max Daily Amount: 2 tablets         For Pharmacy Admin Tracking Only    Program: Medication Refill  CPA in place:    Recommendation Provided To:   Intervention Detail: New Rx: 1, reason: Patient Preference  Intervention Accepted By:   Eugenia Pancoast Closed?:    Time Spent (min): 5

## 2023-01-14 NOTE — Telephone Encounter (Signed)
Appt scheduled for 01/15/23 @ 2p

## 2023-01-14 NOTE — Telephone Encounter (Signed)
Patient wants to get something called in for a  UTI, she has been burning when urinating.  Please give her a call @ [phone]

## 2023-01-15 ENCOUNTER — Ambulatory Visit: Admit: 2023-01-15 | Discharge: 2023-01-15 | Payer: MEDICARE | Attending: Family Medicine | Primary: Family Medicine

## 2023-01-15 DIAGNOSIS — Z0000 Encounter for general adult medical examination without abnormal findings: Secondary | ICD-10-CM

## 2023-01-15 MED ORDER — DULOXETINE HCL 30 MG PO CPEP
30 MG | ORAL_CAPSULE | Freq: Every day | ORAL | 1 refills | Status: DC
Start: 2023-01-15 — End: 2024-01-23

## 2023-01-15 MED ORDER — VALSARTAN 80 MG PO TABS
80 | ORAL_TABLET | Freq: Every morning | ORAL | 1 refills | Status: DC
Start: 2023-01-15 — End: 2023-03-28

## 2023-01-15 MED ORDER — VALSARTAN 40 MG PO TABS
40 MG | ORAL_TABLET | Freq: Every evening | ORAL | 1 refills | Status: AC
Start: 2023-01-15 — End: 2023-03-28

## 2023-01-15 MED ORDER — NITROFURANTOIN MONOHYD MACRO 100 MG PO CAPS
100 MG | ORAL_CAPSULE | Freq: Two times a day (BID) | ORAL | 0 refills | Status: AC
Start: 2023-01-15 — End: 2023-01-22

## 2023-01-15 MED ORDER — DULOXETINE HCL 60 MG PO CPEP
60 MG | ORAL_CAPSULE | Freq: Every day | ORAL | 1 refills | Status: AC
Start: 2023-01-15 — End: 2023-08-28

## 2023-01-15 MED ORDER — VALSARTAN 80 MG PO TABS
80 MG | ORAL_TABLET | Freq: Every day | ORAL | 1 refills | Status: DC
Start: 2023-01-15 — End: 2023-01-15

## 2023-01-15 NOTE — Progress Notes (Signed)
[facility]  [facility]  [phone]. [address]  [place], [place] [address]  [phone]    Subjective  Kimberly [name] is a 89 y.o. Black / [place] American female , established patient, here for evaluation of the concern(s) below;    PMHx: HTN, PVD, pituitary mirroadenoma S/P surgery (follows with Dr. Arville Lime), DM with peripheral neuropathy, DDD , CKD 3a, Malignant neoplasm of left breast, Memory disturbance (follows with neuro), HLD who presents for follow up of chronic problems;     Accompanied By: Niece Rene Kocher):    Pt complains of urinary urgency and dysuria that started a day ago. No blood in the urine. Pt denies any flank pain, fever, chills, nausea, vomiting, abnormal vaginal discharge or bleeding.     Chronic medical problems:    HTN:  Pt on valsartan 80mg  daily in the morning and 40mg  at night.  Caregiver states that he BP has been much better with this dose even during PT sessions.     Neuropathy:  On Lyrica 200mg  BID for neuropathy managed by neurology.     Uses a Rolator walker when needed     Pt denies any  fever, chill, chest pain, SOB, abdominal pain, n/v/d, HA or dizziness.      Other Health Habits and social history:  Her only daughter passed away.     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       Allergies - reviewed:   Allergies   Allergen Reactions    Penicillins Hives and Myalgia    Sulfa Antibiotics Hives     Other reaction(s): Unknown (comments)       Past Medical History - reviewed:  Past Medical History:   Diagnosis Date    Adverse effect of anesthesia     slow to wake    Anginal pain (HCC)     Arthritis     Breast cancer (HCC) 03/10/2018     left lumpectomy    Cancer (HCC) 01/2018    breast    Chronic pain     neuropathy    Constipation     GERD (gastroesophageal reflux disease)     Headache     Hearing loss     High cholesterol     Hypertension      Incontinence     Joint pain     Memory disorder     Muscle pain     Osteoporosis     Ringing in the ears     Snoring     Visual disturbance        Depression screening:  PHQ-9 Total Score: 0 (01/15/2023  2:18 PM)      Review of systems:   A comprehensive review of systems was negative except for that written in the History of Present Illness.     Physical Exam  BP 127/79   Pulse 87   Temp 98.5 F (36.9 C) (Oral)   Resp 16   Ht 1.524 m (5')   Wt 66.2 kg (145 lb 15.1 oz)   SpO2 93%   BMI 28.50 kg/m     General: Alert and oriented, in no acute distress.  SKIN: No rash. No suspicious lesions or moles.  EYE: PERRL. Sclera and conjuctival clear. Extraocular movements intact.  EARS: External normal, canals clear, tympanic membranes normal.   NOSE: Mucosa healthy without drainage or ulceration.  OROPHARYNX: No suspicious lesions, normal dentition, pharynx, tongue and tonsils normal.  NECK: Supple; no masses; thyroid normal.  LUNGS: Respirations unlabored; clear to auscultation bilaterally.  CARDIOVASCULAR: Regular, rate, and rhythm without murmurs, gallops or rubs.  Neurological: Alert and oriented X 3, normal strength and tone. Use a rollator walker     Assessment/Plan.  1. Medicare annual wellness visit, subsequent  2. Primary hypertension  Assessment & Plan:   Well-controlled, continue current medications  Orders:  -     valsartan (DIOVAN) 40 MG tablet; Take 1 tablet by mouth nightly, Disp-90 tablet, R-1Normal  -     valsartan (DIOVAN) 80 MG tablet; Take 1 tablet by mouth every morning, Disp-90 tablet, R-1Normal  3. Idiopathic small fiber peripheral neuropathy  -     DULoxetine (CYMBALTA) 30 MG extended release capsule; Take 1 capsule by mouth Daily with supper, Disp-90 capsule, R-1Normal  -     DULoxetine (CYMBALTA) 60 MG extended release capsule; Take 1 capsule by mouth daily (with breakfast), Disp-90 capsule, R-1Normal  4. Immune-mediated neuropathy (HCC)  -     DULoxetine (CYMBALTA) 60 MG extended release  capsule; Take 1 capsule by mouth daily (with breakfast), Disp-90 capsule, R-1Normal  5. Acute cystitis without hematuria  Assessment & Plan:  Pt unable to urinate at this time. Will treat empirically based on symptoms.  Orders:  -     nitrofurantoin, macrocrystal-monohydrate, (MACROBID) 100 MG capsule; Take 1 capsule by mouth 2 times daily for 7 days, Disp-14 capsule, R-0Normal        Follow up: 3 months. RTC to clinic sooner should symptoms persist, worsen or fail to improve as anticipated.    We discussed the expected course, resolution and complications of the diagnosis(es) in detail.  Medication risks, benefits, costs, interactions, and alternatives were discussed as indicated.  I advised to contact the office if his condition worsens, changes or fails to improve as anticipated. Pt expressed understanding with the diagnosis(es) and plan. Patient understands that this encounter was a temporary measure, and the importance of further follow up and examination was emphasized.  Patient verbalized understanding.      Signed By: Rene Paci, MD     January 15, 2023

## 2023-01-15 NOTE — Assessment & Plan Note (Signed)
Pt unable to urinate at this time. Will treat empirically based on symptoms.

## 2023-01-15 NOTE — Progress Notes (Signed)
Chief Complaint   Patient presents with    Urinary Tract Infection    Medicare AWV       1. "Have you been to the ER, urgent care clinic since your last visit?  Hospitalized since your last visit?" Yes 12/18/22 [facility].    2. "Have you seen or consulted any other health care providers outside of the [facility] since your last visit?" No     3. For patients aged 87-75: Has the patient had a colonoscopy / FIT/ Cologuard? N/A      If the patient is female:    4. For patients aged 4-74: Has the patient had a mammogram within the past 2 years? N/A      5. For patients aged 21-65: Has the patient had a pap smear? N/A       The patient, Kimberly [name]'s, identity was verified by name and DOB.      Health Maintenance Due   Topic Date Due    Shingles vaccine (1 of 2) Never done    Respiratory Syncytial Virus (RSV) Pregnant or age 60 yrs+ (1 - 1-dose 60+ series) Never done    Pneumococcal 65+ years Vaccine (2 of 2 - PCV) 08/19/2015    Annual Wellness Visit (Medicare)  08/19/2022

## 2023-01-15 NOTE — Patient Instructions (Signed)
Preventing Falls: Care Instructions  Injuries and health problems such as trouble walking or poor eyesight can increase your risk of falling. So can some medicines. But there are things you can do to help prevent falls. You can exercise to get stronger. You can also arrange your home to make it safer.    Talk to your doctor about the medicines you take. Ask if any of them increase the risk of falls and whether they can be changed or stopped.   Try to exercise regularly. It can help improve your strength and balance. This can help lower your risk of falling.         Practice fall safety and prevention.   Wear low-heeled shoes that fit well and give your feet good support. Talk to your doctor if you have foot problems that make this hard.  Carry a cellphone or wear a medical alert device that you can use to call for help.  Use stepladders instead of chairs to reach high objects. Don't climb if you're at risk for falls. Ask for help, if needed.  Wear the correct eyeglasses, if you need them.        Make your home safer.   Remove rugs, cords, clutter, and furniture from walkways.  Keep your house well lit. Use night-lights in hallways and bathrooms.  Install and use sturdy handrails on stairways.  Wear nonskid footwear, even inside. Don't walk barefoot or in socks without shoes.        Be safe outside.   Use handrails, curb cuts, and ramps whenever possible.  Keep your hands free by using a shoulder bag or backpack.  Try to walk in well-lit areas. Watch out for uneven ground, changes in pavement, and debris.  Be careful in the winter. Walk on the grass or gravel when sidewalks are slippery. Use de-icer on steps and walkways. Add non-slip devices to shoes.    Put grab bars and nonskid mats in your shower or tub and near the toilet. Try to use a shower chair or bath bench when bathing.   Get into a tub or shower by putting in your weaker leg first. Get out with your strong side first. Have a phone or medical alert  device in the bathroom with you.   Where can you learn more?  Go to [employer] and enter G117 to learn more about "Preventing Falls: Care Instructions."  Current as of: February 19, 2022  Content Version: 14.1   2006-2024 [employer], Inc[employer].   Care instructions adapted under license by [facility]. If you have questions about a medical condition or this instruction, always ask your healthcare professional. [employer], Inc[employer] disclaims any warranty or liability for your use of this information.           Learning About Being Active as an Older Adult  Why is being active important as you get older?     Being active is one of the best things you can do for your health. And it's never too late to start. Being active--or getting active, if you aren't already--has definite benefits. It can:  Give you more energy,  Keep your mind sharp.  Improve balance to reduce your risk of falls.  Help you manage chronic illness with fewer medicines.  No matter how old you are, how fit you are, or what health problems you have, there is a form of activity that will work for you. And the more physical activity you can do, the better your overall  health will be.  What kinds of activity can help you stay healthy?  Being more active will make your daily activities easier. Physical activity includes planned exercise and things you do in daily life. There are four types of activity:  Aerobic.  Doing aerobic activity makes your heart and lungs strong.  Includes walking, dancing, and gardening.  Aim for at least 2 hours spread throughout the week.  It improves your energy and can help you sleep better.  Muscle-strengthening.  This type of activity can help maintain muscle and strengthen bones.  Includes climbing stairs, using resistance bands, and lifting or carrying heavy loads.  Aim for at least twice a week.  It can help protect the knees and other joints.  Stretching.  Stretching gives you better range  of motion in joints and muscles.  Includes upper arm stretches, calf stretches, and gentle yoga.  Aim for at least twice a week, preferably after your muscles are warmed up from other activities.  It can help you function better in daily life.  Balancing.  This helps you stay coordinated and have good posture.  Includes heel-to-toe walking, tai chi, and certain types of yoga.  Aim for at least 3 days a week.  It can reduce your risk of falling.  Even if you have a hard time meeting the recommendations, it's better to be more active than less active. All activity done in each category counts toward your weekly total. You'd be surprised how daily things like carrying groceries, keeping up with grandchildren, and taking the stairs can add up.  What keeps you from being active?  If you've had a hard time being more active, you're not alone. Maybe you remember being able to do more. Or maybe you've never thought of yourself as being active. It's frustrating when you can't do the things you want. Being more active can help. What's holding you back?  Getting started.  Have a goal, but break it into easy tasks. Small steps build into big accomplishments.  Staying motivated.  If you feel like skipping your activity, remember your goal. Maybe you want to move better and stay independent. Every activity gets you one step closer.  Not feeling your best.  Start with 5 minutes of an activity you enjoy. Prove to yourself you can do it. As you get comfortable, increase your time.  You may not be where you want to be. But you're in the process of getting there. Everyone starts somewhere.  How can you find safe ways to stay active?  Talk with your doctor about any physical challenges you're facing. Make a plan with your doctor if you have a health problem or aren't sure how to get started with activity.  If you're already active, ask your doctor if there is anything you should change to stay safe as your body and health change.  If  you tend to feel dizzy after you take medicine, avoid activity at that time. Try being active before you take your medicine. This will reduce your risk of falls.  If you plan to be active at home, make sure to clear your space before you get started. Remove things like TV cords, coffee tables, and throw rugs. It's safest to have plenty of space to move freely.  The key to getting more active is to take it slow and steady. Try to improve only a little bit at a time. Pick just one area to improve on at first. And if an activity  hurts, stop and talk to your doctor.  Where can you learn more?  Go to [employer] and enter P600 to learn more about "Learning About Being Active as an Older Adult."  Current as of: January 08, 2022  Content Version: 14.1   2006-2024 Healthwise, Inc[employer].   Care instructions adapted under license by [facility]. If you have questions about a medical condition or this instruction, always ask your healthcare professional. Healthwise, Incorporated disclaims any warranty or liability for your use of this information.           A Healthy Heart: Care Instructions  Overview     Coronary artery disease, also called heart disease, occurs when a substance called plaque builds up in the vessels that supply oxygen-rich blood to your heart muscle. This can narrow the blood vessels and reduce blood flow. A heart attack happens when blood flow is completely blocked. A high-fat diet, smoking, and other factors increase the risk of heart disease.  Your doctor has found that you have a chance of having heart disease. A heart-healthy lifestyle can help keep your heart healthy and prevent heart disease. This lifestyle includes eating healthy, being active, staying at a weight that's healthy for you, and not smoking or using tobacco. It also includes taking medicines as directed, managing other health conditions, and trying to get a healthy amount of sleep.  Follow-up care is a key part  of your treatment and safety. Be sure to make and go to all appointments, and call your doctor if you are having problems. It's also a good idea to know your test results and keep a list of the medicines you take.  How can you care for yourself at home?  Diet   Use less salt when you cook and eat. This helps lower your blood pressure. Taste food before salting. Add only a little salt when you think you need it. With time, your taste buds will adjust to less salt.    Eat fewer snack items, fast foods, canned soups, and other high-salt, high-fat, processed foods.    Read food labels and try to avoid saturated and trans fats. They increase your risk of heart disease by raising cholesterol levels.    Limit the amount of solid fat--butter, margarine, and shortening--you eat. Use olive, peanut, or canola oil when you cook. Bake, broil, and steam foods instead of frying them.    Eat a variety of fruit and vegetables every day. Dark green, deep orange, red, or yellow fruits and vegetables are especially good for you. Examples include spinach, carrots, peaches, and berries.    Foods high in fiber can reduce your cholesterol and provide important vitamins and minerals. High-fiber foods include whole-grain cereals and breads, oatmeal, beans, brown rice, citrus fruits, and apples.    Eat lean proteins. Heart-healthy proteins include seafood, lean meats and poultry, eggs, beans, peas, nuts, seeds, and soy products.    Limit drinks and foods with added sugar. These include candy, desserts, and soda pop.   Heart-healthy lifestyle   If your doctor recommends it, get more exercise. For many people, walking is a good choice. Or you may want to swim, bike, or do other activities. Bit by bit, increase the time you're active every day. Try for at least 30 minutes on most days of the week.    Try to quit or cut back on using tobacco and other nicotine products. This includes smoking and vaping. If you need help quitting, talk  to your  doctor about stop-smoking programs and medicines. These can increase your chances of quitting for good. Quitting is one of the most important things you can do to protect your heart. It is never too late to quit. Try to avoid secondhand smoke too.    Stay at a weight that's healthy for you. Talk to your doctor if you need help losing weight.    Try to get 7 to 9 hours of sleep each night.    Limit alcohol to 2 drinks a day for men and 1 drink a day for women. Too much alcohol can cause health problems.    Manage other health problems such as diabetes, high blood pressure, and high cholesterol. If you think you may have a problem with alcohol or drug use, talk to your doctor.   Medicines   Take your medicines exactly as prescribed. Call your doctor if you think you are having a problem with your medicine.    If your doctor recommends aspirin, take the amount directed each day. Make sure you take aspirin and not another kind of pain reliever, such as acetaminophen (Tylenol).   When should you call for help?   Call 911 if you have symptoms of a heart attack. These may include:   Chest pain or pressure, or a strange feeling in the chest.    Sweating.    Shortness of breath.    Pain, pressure, or a strange feeling in the back, neck, jaw, or upper belly or in one or both shoulders or arms.    Lightheadedness or sudden weakness.    A fast or irregular heartbeat.   After you call 911, the operator may tell you to chew 1 adult-strength or 2 to 4 low-dose aspirin. Wait for an ambulance. Do not try to drive yourself.  Watch closely for changes in your health, and be sure to contact your doctor if you have any problems.  Where can you learn more?  Go to [employer] and enter F075 to learn more about "A Healthy Heart: Care Instructions."  Current as of: January 27, 2022  Content Version: 14.1   2006-2024 Healthwise, Inc[employer].   Care instructions adapted under license by [facility]. If you have questions about a medical condition or this instruction, always ask your healthcare professional. Health[employer], [employer] disclaims any warranty or liability for your use of this information.      Personalized Preventive Plan for Kimberly [name] - 01/15/2023  Medicare offers a range of preventive health benefits. Some of the tests and screenings are paid in full while other may be subject to a deductible, co-insurance, and/or copay.    Some of these benefits include a comprehensive review of your medical history including lifestyle, illnesses that may run in your family, and various assessments and screenings as appropriate.    After reviewing your medical record and screening and assessments performed today your provider may have ordered immunizations, labs, imaging, and/or referrals for you.  A list of these orders (if applicable) as well as your Preventive Care list are included within your After Visit Summary for your review.    Other Preventive Recommendations:    A preventive eye exam performed by an eye specialist is recommended every 1-2 years to screen for glaucoma; cataracts, macular degeneration, and other eye disorders.  A preventive dental visit is recommended every 6 months.  Try to get at least 150 minutes of exercise per week or 10,000 steps per day on a pedometer .  Order  or download the FREE "Exercise & Physical Activity: Your Everyday Guide" from [employer]. Call 6805270430 or search [employer] online.  You need 1200-1500 mg of calcium and 1000-2000 IU of vitamin D per day. It is possible to meet your calcium requirement with diet alone, but a vitamin D supplement is usually necessary to meet this goal.  When exposed to the sun, use a sunscreen that protects against both UVA and UVB radiation with an SPF of 30 or greater. Reapply every 2 to 3 hours or after sweating, drying off with a towel, or swimming.  Always wear a seat belt  when traveling in a car. Always wear a helmet when riding a bicycle or motorcycle.

## 2023-01-15 NOTE — Progress Notes (Signed)
Medicare Annual Wellness Visit    RILY MYERS is here for Urinary Tract Infection and Medicare AWV    Assessment & Plan   Medicare annual wellness visit, subsequent  Primary hypertension  Assessment & Plan:   Well-controlled, continue current medications  Orders:  -     valsartan (DIOVAN) 40 MG tablet; Take 1 tablet by mouth nightly, Disp-90 tablet, R-1Normal  -     valsartan (DIOVAN) 80 MG tablet; Take 1 tablet by mouth every morning, Disp-90 tablet, R-1Normal  Idiopathic small fiber peripheral neuropathy  -     DULoxetine (CYMBALTA) 30 MG extended release capsule; Take 1 capsule by mouth Daily with supper, Disp-90 capsule, R-1Normal  -     DULoxetine (CYMBALTA) 60 MG extended release capsule; Take 1 capsule by mouth daily (with breakfast), Disp-90 capsule, R-1Normal  Immune-mediated neuropathy (HCC)  -     DULoxetine (CYMBALTA) 60 MG extended release capsule; Take 1 capsule by mouth daily (with breakfast), Disp-90 capsule, R-1Normal  Acute cystitis without hematuria  -     nitrofurantoin, macrocrystal-monohydrate, (MACROBID) 100 MG capsule; Take 1 capsule by mouth 2 times daily for 7 days, Disp-14 capsule, R-0Normal    Recommendations for Preventive Services Due: see orders and patient instructions/AVS.  Recommended screening schedule for the next 5-10 years is provided to the patient in written form: see Patient Instructions/AVS.     Return in 3 months (on 04/17/2023).     Subjective       Patient's complete Health Risk Assessment and screening values have been reviewed and are found in Flowsheets. The following problems were reviewed today and where indicated follow up appointments were made and/or referrals ordered.    Positive Risk Factor Screenings with Interventions:    Fall Risk:  Do you feel unsteady or are you worried about falling? : (!) yes  2 or more falls in past year?: no  Fall with injury in past year?: no     Interventions:    Reviewed medications, home hazards, visual acuity, and co-morbidities  that can increase risk for falls  Recommend to continue using her Rolator walker regularly         Controlled Medication Review:      Today's Pain Level: Pain Score: Zero     Opioid Risk: (Low risk score <55) Opioid risk score: 16    Patient is low risk for opioid use disorder or overdose.    Last PDMP Loraine Leriche as Reviewed:  Review User Review Instant Review Result   Rene Paci 01/15/2023  2:22 PM     Reviewed PDMP [1]           Dentist Screen:  Have you seen the dentist within the past year?: (!) No    Intervention:  Patient declines any further evaluation or treatment     Vision Screen:  Do you have difficulty driving, watching TV, or doing any of your daily activities because of your eyesight?: (!) Yes  Have you had an eye exam within the past year?: Yes  No results found.    Interventions:    They see an eye doctor . Was told there is not much to do regarding her vision     ADL's:   Patient reports needing help with:  Select all that apply: (!) Bathing, Walking/Balance, Dressing  Select all that apply: [employer], Banking/Finances, Shopping, Telephone Use, Presenter, broadcasting, Transportation, Taking Medications, Laundry  Interventions:  Has home aide 24/7 who helps with everything  Objective   Vitals:    01/15/23 1426   BP: 127/79   Pulse: 87   Resp: 16   Temp: 98.5 F (36.9 C)   TempSrc: Oral   SpO2: 93%   Weight: 66.2 kg (145 lb 15.1 oz)   Height: 1.524 m (5')      Body mass index is 28.5 kg/m.               Allergies   Allergen Reactions    Penicillins Hives and Myalgia    Sulfa Antibiotics Hives     Other reaction(s): Unknown (comments)     Prior to Visit Medications    Medication Sig Taking? Authorizing Provider   chlorhexidine (PERIDEX) 0.12 % solution Swish and spit 15 mLs 2 times daily Yes [provider]   DULoxetine (CYMBALTA) 30 MG extended release capsule Take 1 capsule by mouth Daily with supper Yes Anirudh Baiz, Silvano Bilis, MD   DULoxetine (CYMBALTA) 60 MG extended  release capsule Take 1 capsule by mouth daily (with breakfast) Yes Karyme Mcconathy, Silvano Bilis, MD   valsartan (DIOVAN) 40 MG tablet Take 1 tablet by mouth nightly Yes Lanai Conlee, Silvano Bilis, MD   valsartan (DIOVAN) 80 MG tablet Take 1 tablet by mouth every morning Yes Khaleel Beckom, Silvano Bilis, MD   nitrofurantoin, macrocrystal-monohydrate, (MACROBID) 100 MG capsule Take 1 capsule by mouth 2 times daily for 7 days Yes Jonnie Kind, MD   acetaminophen-codeine (TYLENOL #3) 300-30 MG per tablet Take 1 tablet by mouth every 12 hours as needed for Pain for up to 30 days. Max Daily Amount: 2 tablets Yes Kyzen Horn, Silvano Bilis, MD   vitamin E 1000 units capsule Take 1 capsule by mouth daily Yes [provider]   cetirizine (ZYRTEC) 5 MG tablet Take 1 tablet by mouth daily Yes Laranda Burkemper, Silvano Bilis, MD   pregabalin (LYRICA) 200 MG capsule Take 1 capsule by mouth 2 times daily for 180 days. Max Daily Amount: 400 mg Yes Youlanda Roys, MD   rOPINIRole (REQUIP) 0.25 MG tablet TAKE 1 TABLET BY MOUTH EVERY NIGHT Yes Donna Silverman, Silvano Bilis, MD   donepezil (ARICEPT) 5 MG tablet Take 1 tablet by mouth daily (before dinner) Yes Youlanda Roys, MD   Cholecalciferol (VITAMIN D3) 50 MCG (2000 UT) CAPS Take 1 capsule by mouth daily Yes [provider]   ammonium lactate (LAC-HYDRIN) 12 % lotion Apply 1 Application topically 2 times daily as needed Yes [provider]   ascorbic acid (VITAMIN C) 100 MG tablet Take 1 tablet by mouth daily Yes [provider]   acetaminophen (TYLENOL) 500 MG tablet Take by mouth every 6 hours as needed Yes Automatic Reconciliation, Ar   aspirin 81 MG chewable tablet Take by mouth daily Yes Automatic Reconciliation, Ar   carbamide peroxide (DEBROX) 6.5 % otic solution Place 5 drops in ear(s) 2 times daily  Patient not taking: Reported on 01/15/2023  [provider]       CareTeam (Including outside providers/suppliers regularly involved in providing care):   Patient Care Team:  Jonnie Kind, MD as PCP - General  Maurine Minister, Silvano Bilis, MD as PCP - Empaneled Provider  MacDougall, Nolon Bussing, MD (General Surgery)     Reviewed and updated this visit:  Tobacco  Allergies  Meds  Problems  Med Hx  Surg Hx  Soc Hx  Fam Hx

## 2023-01-15 NOTE — Assessment & Plan Note (Signed)
Well-controlled, continue current medications

## 2023-01-19 NOTE — Care Coordination-Inpatient (Signed)
Ambulatory Care Coordination Note     01/18/23     Patient Current Location:  [place]     This patient was received as a referral from [employer] health report .    ACM contacted the patient, caregiver, Rene Kocher (nniece)  by telephone. Verified name and DOB with patient as identifiers. Provided introduction to self, and explanation of the ACM role.   Patient accepted care management services at this time.          ACM: Collier Flowers, RN     Challenges to be reviewed by the provider   Additional needs identified to be addressed with provider No  none               Method of communication with provider: none.    Care Summary Note: ACM contacted the patient who gave the phone to her caregiver for discussion. Completed OV on 01/15/23. Admits to feeling better since being treated for her UTI with Macrobid. Drinking extra fluids. Patient uses Rolator to assist with mobility. Admits to taking all medications as ordered. Patient denies any other distress. BP is at goal on current medications. Takes Cymbalta for peripheral neuropathy. BP 127/79 during OV.    Offered patient enrollment in the Remote Patient Monitoring (RPM) program for in-home monitoring: Yes, but did not enroll at this time: limited patient ability to navigate RPM/equipment.     Assessments Completed:       01/19/2023     6:28 AM   Amb Fall Risk Assessment and TUG Test   Do you feel unsteady or are you worried about falling?  yes   2 or more falls in past year? no   Fall with injury in past year? no    ,   Care Coordination Interventions    Referral from Primary Care Provider: No  Suggested Interventions and Community Resources      ,   Hypertension - Encounter Level    Symptom course: improving      , and   General Assessment    Do you have any symptoms that are causing concern?: No          Medications Reviewed:   Not completed during this call:      Advance Care Planning:   Reviewed and current     Care Planning:   Education Documentation  Educate  potential activities for participation, taught by Collier Flowers, RN at 01/19/2023  6:44 AM.  Learner: Caregiver, Patient  Readiness: Acceptance  Method: Explanation  Response: Verbalizes Understanding    Educate energy conservation techniques, taught by Collier Flowers, RN at 01/19/2023  6:44 AM.  Learner: Caregiver, Patient  Readiness: Acceptance  Method: Explanation  Response: Verbalizes Understanding    Educate activity level, taught by Collier Flowers, RN at 01/19/2023  6:44 AM.  Learner: Caregiver, Patient  Readiness: Acceptance  Method: Explanation  Response: Verbalizes Understanding    Discuss history of factors that may impair activity tolerance, taught by Collier Flowers, RN at 01/19/2023  6:44 AM.  Learner: Caregiver, Patient  Readiness: Acceptance  Method: Explanation  Response: Verbalizes Understanding    Discuss exercises to increase strength and endurance, taught by Collier Flowers, RN at 01/19/2023  6:44 AM.  Learner: Caregiver, Patient  Readiness: Acceptance  Method: Explanation  Response: Verbalizes Understanding    Educate home safety, taught by Collier Flowers, RN at 01/19/2023  6:44 AM.  Learner: Caregiver, Patient  Readiness: Acceptance  Method: Explanation  Response:  Verbalizes Understanding    COGNITIVE : FALLS-RISK OF, taught by Collier Flowers, RN at 01/19/2023  6:44 AM.  Learner: Caregiver, Patient  Readiness: Acceptance  Method: Explanation  Response: Trenton Gammon Understanding    COGNITIVE : FALLS-RISK OF, taught by Collier Flowers, RN at 01/19/2023  6:44 AM.  Learner: Caregiver, Patient  Readiness: Acceptance  Method: Explanation  Response: Verbalizes Understanding    Educate safety precautions, taught by Collier Flowers, RN at 01/19/2023  6:44 AM.  Learner: Caregiver, Patient  Readiness: Acceptance  Method: Explanation  Response: Verbalizes Understanding    Education Comments  No comments found.     ,    Goals Addressed                   This Visit's Progress      Conditions and Symptoms        I will schedule office visits, as directed by my provider.  I will keep my appointment or reschedule if I have to cancel.  I will notify my provider of any barriers to my plan of care.  I will notify my provider of any symptoms that indicate a worsening of my condition.  Barriers: none  Plan for overcoming my barriers: N/A  Confidence: 8/10  Anticipated Goal Completion Date: 04/20/23               PCP/Specialist follow up:   Future Appointments         Provider Specialty Dept Phone    03/20/2023 11:00 AM Youlanda Roys, MD Neurology [phone]    04/17/2023 11:20 AM Jonnie Kind, MD Family Medicine [phone]    11/27/2023 1:40 PM Sabino Snipes, MD General Surgery [phone]            Follow Up:   Plan for next ACM outreach in approximately 1 week to complete:  - disease specific assessments  - medication review   - goal progression  - education .   caregiver is agreeable to this plan.

## 2023-01-30 NOTE — Progress Notes (Signed)
Faxed to: [facility] Order #[id number]  [phone]

## 2023-01-31 NOTE — Progress Notes (Signed)
Faxed to: [facility] Order #[id number]  [phone]

## 2023-02-01 NOTE — Care Coordination-Inpatient (Signed)
Ambulatory Care Coordination Note     02/01/2023 9:04 PM     Patient Current Location:  [place]     ACM contacted the caregiver, Niece-Regina,   by telephone. Verified name and DOB with caregiver as identifiers.         ACM: Collier Flowers, RN     Challenges to be reviewed by the provider   Additional needs identified to be addressed with provider No  none               Method of communication with provider: none.    Care Summary Note:  Completed OV on 01/15/23. Has completed Macrobid. According to niece, patient has been consistent with medications and fluids. Denies further urinary discomfort. Patient  has generalize discomfort for which she takes Lyrica.  BP is at goal. Will follow up again on 03/20/23.    Offered patient enrollment in the Remote Patient Monitoring (RPM) program for in-home monitoring: Yes, but did not enroll at this time: limited patient ability to navigate RPM/equipment.     Assessments Completed:       02/01/2023     6:48 PM   Amb Fall Risk Assessment and TUG Test   Do you feel unsteady or are you worried about falling?  yes   2 or more falls in past year? no   Fall with injury in past year? no    ,   Care Coordination Interventions    Referral from Primary Care Provider: No  Suggested Interventions and Community Resources  Fall Risk Prevention: In Process  Disease Specific Clinic: In Process  Disease Association: In Process  Specialty Services Referral: In Process  Other Therapy Services: In Process  Other Services: In Process      ,   Hypertension - Encounter Level    Symptom course: improving      , and   General Assessment              Medications Reviewed:   Completed during this call    Advance Care Planning:   Reviewed and current     Care Planning:   Education Documentation  Educate potential activities for participation, taught by Collier Flowers, RN at 02/01/2023  3:22 PM.  Learner: Family  Readiness: Acceptance  Method: Explanation  Response: Verbalizes Understanding    Educate  energy conservation techniques, taught by Collier Flowers, RN at 02/01/2023  3:22 PM.  Learner: Family  Readiness: Acceptance  Method: Explanation  Response: Verbalizes Understanding    Educate activity level, taught by Collier Flowers, RN at 02/01/2023  3:22 PM.  Learner: Family  Readiness: Acceptance  Method: Explanation  Response: Verbalizes Understanding    Discuss history of factors that may impair activity tolerance, taught by Collier Flowers, RN at 02/01/2023  3:22 PM.  Learner: Family  Readiness: Acceptance  Method: Explanation  ResponseTrenton Gammon Understanding    Discuss exercises to increase strength and endurance, taught by Collier Flowers, RN at 02/01/2023  3:22 PM.  Learner: Family  Readiness: Acceptance  Method: Explanation  Response: Verbalizes Understanding    Educate home safety, taught by Collier Flowers, RN at 02/01/2023  3:22 PM.  Learner: Family  Readiness: Acceptance  Method: Explanation  Response: Verbalizes Understanding    COGNITIVE : FALLS-RISK OF, taught by Collier Flowers, RN at 02/01/2023  3:22 PM.  Learner: Family  Readiness: Acceptance  Method: Explanation  Response: Verbalizes Understanding    COGNITIVE : FALLS-RISK OF, taught by  Collier Flowers, RN at 02/01/2023  3:22 PM.  Learner: Family  Readiness: Acceptance  Method: Explanation  ResponseTrenton Gammon Understanding    Educate safety precautions, taught by Collier Flowers, RN at 02/01/2023  3:22 PM.  Learner: Family  Readiness: Acceptance  Method: Explanation  Response: Verbalizes Understanding    Education Comments  No comments found.     ,    Goals Addressed                   This Visit's Progress     Conditions and Symptoms   Improving     I will schedule office visits, as directed by my provider.  I will keep my appointment or reschedule if I have to cancel.  I will notify my provider of any barriers to my plan of care.  I will notify my provider of any symptoms that indicate a worsening of my  condition.  Barriers: none  Plan for overcoming my barriers: N/A  Confidence: 8/10  Anticipated Goal Completion Date: 04/20/23      02/01/23  Activity Intolerance-Dementia  Discuss exercises to increase strength and endurance  Discuss history of factors that may impair activity tolerance  Educate activity level  Educate energy conservation techniques  Educate potential activities for participation                 PCP/Specialist follow up:   Future Appointments         Provider Specialty Dept Phone    03/20/2023 11:00 AM Youlanda Roys, MD Neurology [phone]    04/17/2023 11:20 AM Jonnie Kind, MD Family Medicine [phone]    06/10/2023 1:00 PM [facility] Radiology [phone]    11/27/2023 1:40 PM Sabino Snipes, MD General Surgery [phone]            Follow Up:   Plan for next ACM outreach in approximately 2 weeks to complete:  - disease specific assessments  - goal progression  - education .   caregiver is agreeable to this plan.

## 2023-02-09 ENCOUNTER — Encounter

## 2023-02-11 MED ORDER — CETIRIZINE HCL 5 MG PO TABS
5 MG | ORAL_TABLET | Freq: Every day | ORAL | 1 refills | Status: AC
Start: 2023-02-11 — End: 2023-07-29

## 2023-02-14 ENCOUNTER — Ambulatory Visit: Payer: MEDICARE | Attending: Family Medicine | Primary: Family Medicine

## 2023-02-19 NOTE — Care Coordination-Inpatient (Signed)
Ambulatory Care Coordination Note     02/19/2023 4:43 PM     Patient Current Location:  [place]     ACM contacted the patient by telephone. Verified name and DOB with patient as identifiers.         ACM: Collier Flowers, RN     Challenges to be reviewed by the provider   Additional needs identified to be addressed with provider No  none               Method of communication with provider: none.    Care Summary Note: Patient to resume HH services by [facility]. ACM was able to speak with care giver. Patient has followed up with provider. Next follow up scheduled for 03/20/23. Has been compliant with diet and medication.     Offered patient enrollment in the Remote Patient Monitoring (RPM) program for in-home monitoring: Yes, but did not enroll at this time: already monitoring with home equipment.     Assessments Completed:       02/19/2023     4:38 PM   Amb Fall Risk Assessment and TUG Test   Do you feel unsteady or are you worried about falling?  yes   2 or more falls in past year? no   Fall with injury in past year? no    ,   Care Coordination Interventions    Referral from Primary Care Provider: No  Suggested Interventions and Community Resources  Fall Risk Prevention: In Process  Disease Specific Clinic: In Process  Disease Association: In Process  Specialty Services Referral: In Process  Other Therapy Services: In Process  Other Services: In Process      ,   Hypertension - Encounter Level    Symptom course: improving      , and   General Assessment    Do you have any symptoms that are causing concern?: No          Medications Reviewed:   Completed during a previous call     Advance Care Planning:   Reviewed and current     Care Planning:   Education Documentation  Educate potential activities for participation, taught by Collier Flowers, RN at 02/19/2023  4:38 PM.  Learner: Patient  Readiness: Acceptance  Method: Explanation  Response: Verbalizes Understanding    Educate energy conservation techniques, taught  by Collier Flowers, RN at 02/19/2023  4:38 PM.  Learner: Patient  Readiness: Acceptance  Method: Explanation  Response: Verbalizes Understanding    Educate activity level, taught by Collier Flowers, RN at 02/19/2023  4:38 PM.  Learner: Patient  Readiness: Acceptance  Method: Explanation  Response: Verbalizes Understanding    Discuss history of factors that may impair activity tolerance, taught by Collier Flowers, RN at 02/19/2023  4:38 PM.  Learner: Patient  Readiness: Acceptance  Method: Explanation  Response: Verbalizes Understanding    Discuss exercises to increase strength and endurance, taught by Collier Flowers, RN at 02/19/2023  4:38 PM.  Learner: Patient  Readiness: Acceptance  Method: Explanation  Response: Verbalizes Understanding    Educate home safety, taught by Collier Flowers, RN at 02/19/2023  4:38 PM.  Learner: Patient  Readiness: Acceptance  Method: Explanation  Response: Trenton Gammon Understanding    COGNITIVE : FALLS-RISK OF, taught by Collier Flowers, RN at 02/19/2023  4:38 PM.  Learner: Patient  Readiness: Acceptance  Method: Explanation  Response: Verbalizes Understanding    COGNITIVE : FALLS-RISK OF, taught by Penni Bombard,  Virgina Organ, RN at 02/19/2023  4:38 PM.  Learner: Patient  Readiness: Acceptance  Method: Explanation  Response: Verbalizes Understanding    Educate safety precautions, taught by Collier Flowers, RN at 02/19/2023  4:38 PM.  Learner: Patient  Readiness: Acceptance  Method: Explanation  Response: Verbalizes Understanding    Education Comments  No comments found.     ,    Goals Addressed                   This Visit's Progress     Conditions and Symptoms   Improving     I will schedule office visits, as directed by my provider.  I will keep my appointment or reschedule if I have to cancel.  I will notify my provider of any barriers to my plan of care.  I will notify my provider of any symptoms that indicate a worsening of my condition.  Barriers: none  Plan for overcoming  my barriers: N/A  Confidence: 8/10  Anticipated Goal Completion Date: 04/20/23      02/01/23  Activity Intolerance-Dementia  Discuss exercises to increase strength and endurance  Discuss history of factors that may impair activity tolerance  Educate activity level  Educate energy conservation techniques  Educate potential activities for participation      02/19/23  Admits to feeling well  Has followed up as scheduled  Next follow up on 03/20/23                 PCP/Specialist follow up:   Future Appointments         Provider Specialty Dept Phone    03/20/2023 11:00 AM Youlanda Roys, MD Neurology [phone]    04/17/2023 11:20 AM Jonnie Kind, MD Family Medicine [phone]    06/10/2023 1:00 PM [facility] Radiology [phone]    11/27/2023 1:40 PM Sabino Snipes, MD General Surgery [phone]            Follow Up:   Plan for next ACM outreach in approximately 2 weeks to complete:  - disease specific assessments  - medication review   - goal progression  - education .   Patient  is agreeable to this plan.

## 2023-03-05 NOTE — Care Coordination-Inpatient (Signed)
Ambulatory Care Coordination Note     03/05/2023      Patient Current Location:  [place]     Patient contacted the patient by telephone. Verified name and DOB with patient as identifiers.         ACM: Collier Flowers, RN     Challenges to be reviewed by the provider   Additional needs identified to be addressed with provider No  none               Method of communication with provider: none.    Care Summary Note: ACM was able to speak with patient and niece. Patient is followed by [facility]. Has been compliant with diet and medications. Will follow up again on 03/20/23. Still complain of generalized discomfort. BP now at goal. Educated on importance of activity with assistive devices and activities that will conserve energy and safety.    Offered patient enrollment in the Remote Patient Monitoring (RPM) program for in-home monitoring: Yes, but did not enroll at this time: limited patient ability to navigate RPM/equipment.     Assessments Completed:       02/19/2023     4:38 PM   Amb Fall Risk Assessment and TUG Test   Do you feel unsteady or are you worried about falling?  yes   2 or more falls in past year? no   Fall with injury in past year? no    ,   Care Coordination Interventions    Referral from Primary Care Provider: No  Suggested Interventions and Community Resources  Fall Risk Prevention: In Process  Disease Specific Clinic: In Process  Disease Association: In Process  Specialty Services Referral: In Process  Other Therapy Services: In Process  Other Services: In Process      ,   Hypertension - Encounter Level    Symptom course: improving      , and   General Assessment    Do you have any symptoms that are causing concern?: No          Medications Reviewed:   Patient denies any changes with medications and reports taking all medications as prescribed.    Advance Care Planning:   Reviewed and current     Care Planning:   Education Documentation  Educate potential activities for participation, taught by  Collier Flowers, RN at 03/05/2023 11:36 PM.  Learner: Patient  Readiness: Acceptance  Method: Explanation  Response: Verbalizes Understanding    Educate energy conservation techniques, taught by Collier Flowers, RN at 03/05/2023 11:36 PM.  Learner: Patient  Readiness: Acceptance  Method: Explanation  Response: Verbalizes Understanding    Educate activity level, taught by Collier Flowers, RN at 03/05/2023 11:36 PM.  Learner: Patient  Readiness: Acceptance  Method: Explanation  Response: Verbalizes Understanding    Discuss history of factors that may impair activity tolerance, taught by Collier Flowers, RN at 03/05/2023 11:36 PM.  Learner: Patient  Readiness: Acceptance  Method: Explanation  Response: Verbalizes Understanding    Discuss exercises to increase strength and endurance, taught by Collier Flowers, RN at 03/05/2023 11:36 PM.  Learner: Patient  Readiness: Acceptance  Method: Explanation  Response: Verbalizes Understanding    Educate home safety, taught by Collier Flowers, RN at 03/05/2023 11:36 PM.  Learner: Patient  Readiness: Acceptance  Method: Explanation  Response: Trenton Gammon Understanding    COGNITIVE : FALLS-RISK OF, taught by Collier Flowers, RN at 03/05/2023 11:36 PM.  Learner: Patient  Readiness: Acceptance  Method: Explanation  Response: Verbalizes Understanding    COGNITIVE : FALLS-RISK OF, taught by Collier Flowers, RN at 03/05/2023 11:36 PM.  Learner: Patient  Readiness: Acceptance  Method: Explanation  Response: Verbalizes Understanding    Educate safety precautions, taught by Collier Flowers, RN at 03/05/2023 11:36 PM.  Learner: Patient  Readiness: Acceptance  Method: Explanation  Response: Verbalizes Understanding    Education Comments  No comments found.     ,    Goals Addressed                   This Visit's Progress     Conditions and Symptoms   Improving     I will schedule office visits, as directed by my provider.  I will keep my appointment or reschedule if I have to  cancel.  I will notify my provider of any barriers to my plan of care.  I will notify my provider of any symptoms that indicate a worsening of my condition.  Barriers: none  Plan for overcoming my barriers: N/A  Confidence: 8/10  Anticipated Goal Completion Date: 04/20/23      02/01/23  Activity Intolerance-Dementia  Discuss exercises to increase strength and endurance  Discuss history of factors that may impair activity tolerance  Educate activity level  Educate energy conservation techniques  Educate potential activities for participation      02/19/23  Admits to feeling well  Has followed up as scheduled  Next follow up on 03/20/23                 PCP/Specialist follow up:   Future Appointments         Provider Specialty Dept Phone    03/20/2023 11:00 AM Youlanda Roys, MD Neurology [phone]    04/17/2023 11:20 AM Jonnie Kind, MD Family Medicine [phone]    06/10/2023 1:00 PM [facility] Radiology [phone]    11/27/2023 1:40 PM Sabino Snipes, MD General Surgery [phone]            Follow Up:   Plan for next ACM outreach in approximately 3 weeks to complete:  - disease specific assessments  - medication review   - goal progression  - education .   Caregiver is agreeable to this plan.

## 2023-03-19 ENCOUNTER — Encounter

## 2023-03-19 MED ORDER — PREGABALIN 200 MG PO CAPS
200 MG | ORAL_CAPSULE | ORAL | 5 refills | Status: DC
Start: 2023-03-19 — End: 2023-09-17

## 2023-03-19 NOTE — Telephone Encounter (Signed)
Seen 09-18-22  Filled 09-28-22  Appt 03-24-23

## 2023-03-20 ENCOUNTER — Inpatient Hospital Stay: Admit: 2023-03-20 | Payer: MEDICARE | Primary: Family Medicine

## 2023-03-20 ENCOUNTER — Ambulatory Visit: Payer: MEDICARE | Primary: Family Medicine

## 2023-03-20 ENCOUNTER — Ambulatory Visit: Admit: 2023-03-20 | Payer: MEDICARE | Attending: Neurology | Primary: Family Medicine

## 2023-03-20 DIAGNOSIS — M25571 Pain in right ankle and joints of right foot: Secondary | ICD-10-CM

## 2023-03-20 DIAGNOSIS — D352 Benign neoplasm of pituitary gland: Secondary | ICD-10-CM

## 2023-03-20 MED ORDER — ACETAMINOPHEN-CODEINE 300-30 MG PO TABS
300-30 MG | ORAL_TABLET | Freq: Two times a day (BID) | ORAL | 0 refills | Status: DC | PRN
Start: 2023-03-20 — End: 2023-05-21

## 2023-03-20 MED ORDER — ROPINIROLE HCL 0.25 MG PO TABS
0.25 MG | ORAL_TABLET | Freq: Every evening | ORAL | 1 refills | Status: DC
Start: 2023-03-20 — End: 2023-09-17

## 2023-03-20 NOTE — Progress Notes (Signed)
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    Subjective:     Kimberly [name] is a 89 y.o. right-handed African-American female we are seeing at the request of Dr. Maurine Minister, on urgent work in basis for evaluation of new problem of patient having her foot turned out on the right side, and the family concerned about 1 to know if there is anything they can do, and on looking at her foot, looks like she has a chronic deviation from arthritis in her ankle, which is swollen, we will x-ray the ankle but I told him I do not think there is anything we can do because she seems to have a fused eversion of the ankle on the right side.  We will see what the x-ray says and send her to orthopedics if there is anything that needs to be done but is really not that painful for her.  She is amatory with a walker and does fairly well.  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 Reece Agar for pain management and takes Tylenol with codeine in the morning and plain Tylenol later in the day and is able to live with her pain fairly well.  She has not had any progression of weakness.  She has no diabetes and no diabetic neuropathy.  She was last seen 6 months ago for possible stroke that the patient was admitted to the hospital for 1 week ago, and to see in follow-up, but her workup apparently did not show a stroke, with an MRI just showing atrophy and white matter disease, and her stable pituitary macroadenoma that is being followed conservatively, and the pituitary tumor was actually somewhat  smaller, and she was evaluated for the right facial numbness, but the right facial numbness is gotten better, and has resolved, but she still complains of numbness and pain neuropathy in her right arm and wrist, and hand, and just moving her wrist seems to bring on the pain, and x-ray of the hand relative unremarkable but the wrist did show some mild arthritis, and she definitely has significant cervical disease with severe neuroforaminal disease at several levels, and because of that and the possibility of carpal tunnel syndrome, we will check an EMG of her right arm, did the x-rays as above, and she is going to get physical therapy at home and we advised the daughter to make sure that they do therapy on her right wrist and hand.  Her neuropathic pain has gotten better on the increased Cymbalta 60 mg a day.Marland Kitchen  She was previously seen for pain in hands and feet that is better on the Lyrica 200 mg 2 times a day which she is tolerating well without side effect, and new problem of memory loss which is actually somewhat better on Aricept 5 mg a day according to her daughter..  She has presumed radiculopathy versus neuropathy in her legs and hands.  Her EMG did document neuropathy, and her workup for metabolic studies was negative for  any treatable causes to see.  Her MRI of the cervical spine just showed moderate severe arthritis, but no real surgical disease at her age.  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  She is encouraged take her multivitamins and vitamin D every day remain mentally and physically active.  She is going to get an MRI of the lumbar spine done in the near future to further evaluate this neuropathy.  Her EMG study did document the neuropathy that was moderate to severe.  She been followed by several neurologist in the past, but they have left the practice.   She is  taking Lyrica 200 mg twice a day with some improvement, but seems to be breaking through.  She has bilateral foot drop of unclear etiology, and I guess this may be related to the neuropathy.  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  She has a past history of breast cancer, previous lumpectomy, chronic neuropathic pain, hyperlipidemia, hypertension, but apparently is not a diabetic.  She has a pituitary tumor also.  She has had back problems and received pain management and epidural steroids from previous pain management doctors.  She takes Lidoderm patches as recommended by Dr. Sandra Cockayne.  She has tried topical pain relievers in addition also of multiple types.  She has had syncope in the past and memory loss, and probably has a mild dementia at her age.  She has significant arthritis in her neck and back.  She has a very complicated history and all of her past history was reviewed in detail and previous MRI scans, and her neurology notes and medical notes, but no neurosurgery notes are noted, and she thinks that may be because she thinks she might have been operated on at [facility], she really cannot remember due to her memory problems.  Despite the enlarging pituitary macroadenoma, she does not complain of headaches or new visual symptoms now, but her vision does seem to be impaired  Her memory seems more recent memory, and her family had some memory problems in the several members, they got better with Aricept and the daughter would like that, we will give her some Aricept for memory loss and see how she does and that may make her more active, she does tend to sleep a bit.  On her Mini-Mental status examination she does appear to have a mild dementia.    Past Medical  History:   Diagnosis Date    Adverse effect of anesthesia     slow to wake    Anginal pain (HCC)     Arthritis     Breast cancer (HCC) 03/10/2018     left lumpectomy    Cancer (HCC) 01/2018    breast    Chronic pain     neuropathy    Constipation     GERD (gastroesophageal reflux disease)     Headache     Hearing loss     High cholesterol     Hypertension     Incontinence     Joint pain     Memory  disorder     Muscle pain     Osteoporosis     Ringing in the ears     Snoring     Visual disturbance       Past Surgical History:   Procedure Laterality Date    BREAST LUMPECTOMY Left 03/10/2018    LEFT BREAST LUMPECTOMY WITH ULTRASOUND GUIDANCE performed by Sabino Snipes, MD at [facility]    HYSTERECTOMY (CERVIX STATUS UNKNOWN)      ORTHOPEDIC SURGERY      epidural pain mtg - pt unsure    OTHER SURGICAL HISTORY Right 10/15/2016    ligament correction right foot    OTHER SURGICAL HISTORY  11/26/2016    Pituitary tumor removal    US BREAST BIOPSY W LOC DEVICE 1ST LESION LEFT Left 01/21/2018    US BREAST NEEDLE BIOPSY LEFT 01/21/2018 [facility] [place]     Family History   Problem Relation Age of Onset    Dementia Sister     Dementia Brother     Cancer Daughter     Breast Cancer Daughter 81    Breast Cancer Sister 66    Breast Cancer Mother 20    No Known Problems Father       Social History     Tobacco Use    Smoking status: Never    Smokeless tobacco: Never   Substance Use Topics    Alcohol use: Not Currently         Current Outpatient Medications:     rOPINIRole (REQUIP) 0.25 MG tablet, TAKE 1 TABLET BY MOUTH EVERY NIGHT, Disp: 90 tablet, Rfl: 1    acetaminophen-codeine (TYLENOL #3) 300-30 MG per tablet, Take 1 tablet by mouth every 12 hours as needed for Pain for up to 30 days. Max Daily Amount: 2 tablets, Disp: 60 tablet, Rfl: 0    Zinc 30 MG CAPS, Take by mouth, Disp: , Rfl:     Omega-3 Fatty Acids (FISH OIL) 1000 MG CPDR, Take 3 capsules by mouth, Disp: , Rfl:     pregabalin (LYRICA) 200 MG capsule, TAKE 1 CAPSULE  BY MOUTH TWICE DAILY. MAX DAILY AMOUNT: 400 MG, Disp: 60 capsule, Rfl: 5    cetirizine (ZYRTEC) 5 MG tablet, TAKE 1 TABLET BY MOUTH DAILY, Disp: 90 tablet, Rfl: 1    chlorhexidine (PERIDEX) 0.12 % solution, Swish and spit 15 mLs 2 times daily, Disp: , Rfl:     carbamide peroxide (DEBROX) 6.5 % otic solution, Place 5 drops in ear(s) 2 times daily, Disp: , Rfl:     DULoxetine (CYMBALTA) 30 MG extended release capsule, Take 1 capsule by mouth Daily with supper, Disp: 90 capsule, Rfl: 1    DULoxetine (CYMBALTA) 60 MG extended release capsule, Take 1 capsule by mouth daily (with breakfast), Disp: 90 capsule, Rfl: 1    valsartan (DIOVAN) 40 MG tablet, Take 1 tablet by mouth nightly, Disp: 90 tablet, Rfl: 1    valsartan (DIOVAN) 80 MG tablet, Take 1 tablet by mouth every morning, Disp: 90 tablet, Rfl: 1    vitamin E 1000 units capsule, Take 1 capsule by mouth daily, Disp: , Rfl:     donepezil (ARICEPT) 5 MG tablet, Take 1 tablet by mouth daily (before dinner), Disp: 30 tablet, Rfl: 11    Cholecalciferol (VITAMIN D3) 50 MCG (2000 UT) CAPS, Take 1 capsule by mouth daily, Disp: , Rfl:     ammonium lactate (LAC-HYDRIN) 12 % lotion, Apply 1 Application topically 2 times daily as  needed, Disp: , Rfl:     ascorbic acid (VITAMIN C) 100 MG tablet, Take 1 tablet by mouth daily, Disp: , Rfl:     acetaminophen (TYLENOL) 500 MG tablet, Take by mouth every 6 hours as needed, Disp: , Rfl:     aspirin 81 MG chewable tablet, Take by mouth daily, Disp: , Rfl:         Allergies   Allergen Reactions    Penicillins Hives and Myalgia    Sulfa Antibiotics Hives     Other reaction(s): Unknown (comments)      MRI Results (most recent):  @BSHSILASTIMGCAT (EAV4098:1)@    @BSHSILASTIMGCAT ([id number]:9)@  Review of Systems:  A comprehensive review of systems was negative except for: Constitutional: positive for fatigue and malaise  Musculoskeletal: positive for arthralgias, back pain, muscle weakness, myalgias, neck pain, and stiff joints  Neurological:  positive for coordination problems, gait problems, memory problems, paresthesia, and weakness  Behvioral/Psych: positive for anxiety, depression, and dementia    Vitals:    03/20/23 1110   BP: 104/62   Site: Left Upper Arm   Position: Sitting   Cuff Size: Medium Adult   Pulse: 85   Resp: 15   Temp: 97.7 F (36.5 C)   TempSrc: Temporal   SpO2: 92%   Weight: 67 kg (147 lb 9.6 oz)   Height: 1.575 m (5\' 2" )     Objective:     I      NEUROLOGICAL EXAM:    Appearance:  The patient is poorly developed, well nourished, provides a poor history and is in no acute distress.   Mental Status: Oriented to place and person, and the president, but not the date, and she is a somewhat poor historian, and cognitive function is mildly abnormal and speech is fluent and no aphasia or dysarthria. Mood and affect appropriate.   Cranial Nerves:   Intact visual fields. Fundi are poorly seen. PERLA, EOM's full, no nystagmus, no ptosis. Facial sensation is normal. Corneal reflexes are not tested. Facial movement is asymmetric. Hearing is normal bilaterally. Palate is midline with normal sternocleidomastoid and trapezius muscles are normal. Tongue is midline.  Neck without meningismus or bruits  Temporal arteries are not tender or enlarged  TMJ areas are not tender on palpation   Motor:  3-4/5 strength in upper and lower proximal and distal muscles except that she has severe foot drop and distal weakness in both legs and has to wear bilateral AFOs, possibly due to neuropathy or lumbar radiculopathy.  Reduced bulk and tone. No fasciculations.  Rapid alternating movement is symmetric and slow bilaterally   Reflexes:   Deep tendon reflexes 1+/4 and symmetrical.  No babinski or clonus present   Sensory:   Abnormal to touch, pinprick and vibration and temperature to about knee level.  DSS is intact   Gait:  Abnormal gait for patient's age as she has to use a walker and occasionally bilateral AFO braces.   Tremor:   No tremor noted.   Cerebellar:   Moderately abnormal cerebellar signs present on Romberg and tandem testing and finger-nose-finger exam.   Neurovascular:  Normal heart sounds and regular rhythm, peripheral pulses decreased, and no carotid bruits.           Assessment:      Diagnosis Orders   1. Pituitary macroadenoma with extrasellar extension (HCC)        2. Bilateral carotid artery stenosis        3. Cerebral microvascular disease  4. Lumbosacral radiculopathy due to degenerative joint disease of spine        5. Idiopathic small fiber peripheral neuropathy        6. Immune-mediated neuropathy (HCC)        7. 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 Disturbance of memory        9. Memory loss        10. Chronic pain of right ankle  XR ANKLE RIGHT (MIN 3 VIEWS)        Active Problems:    * No active hospital problems. *  Resolved Problems:    * No resolved hospital problems. *      Plan:     Patient with new problem of walking with her right foot everted, and seems to have a stable deformity of her right ankle which we will x-ray, to see if there is anything orthopedically that can be done, but I think she is fused and will be stuck with her everted foot.  It is not painful so no other treatment really is needed at her age of 28.  Her right arm and hand pain is better she seems to have a C8 radiculopathy that is somewhat better controlled with Tylenol with codeine in the morning and Tylenol later in the day and was sent to Dr. Reece Agar for some help and pain management from all of her arthritis in her neck and back that is moderate to severe, and at her age is nonsurgical.  Patient pituitary macroadenoma on repeat scan was actually smaller than it was in the past so we will check that again in 6 months time for follow-up and she will call if any problem in the interim but other than occasional headache has had no major problem.  Her memory loss is better taking the Aricept 5 mg a day and that  will be continued.  Her EMG study done in March 2024 to document the C8 radiculopathy, but no carpal tunnel syndrome, and she does have some neuropathy that is chronic in her arms and legs probably a combination from neuropathy and her cervical myelopathy.  Patient with new problem of possible stroke because of right-sided weakness and numbness, mainly in the face, but her workup was negative with a negative CTA showing no large vessel occlusion, and her MRI scan showed no acute stroke, just a stable pituitary macroadenoma, but she still complaining of right hand pain and right leg pain, and because of that reason we will check an EMG of her right arm, rule out carpal tunnel syndrome rule out cervical radiculopathy, she has previous MRI of the neck that shows severe neuroforaminal disease, and moderate stenosis, and because of pain just moving her wrist, she seems to have some arthritis there on x-ray we did today and of her right hand, and she is to get physical therapy for that.  Patient with complicated problem of severe burning pain in her hands and feet, my concern is this may be partly related to her cervical disease which was described as moderate to severe, on MRI of the brain, and on her EMG she did have moderate neuropathy, and her panels did not show any treatable cause, to idiopathic, although can do was treated at her age, we will continue to do that and see how she does, and she is doing better on the increased dose of Cymbalta was given to her in  the hospital last week, they added another 30 mg for the 60 mg she is taking already.  MRI of the cervical spine did show moderate disease but no surgical disease to get her age, so we can do is possible that and try to keep her comfortable.  MRI of the lumbar spine shows moderate severe L4-5 and L5-S1 spinal stenosis and significant degenerative changes throughout the rest of the spine.Marland Kitchen  MRI of the brain did show the macroadenoma of the pituitary gland  stable, not enlarging or enhancing, we will continue to follow that closely.    We discussed this in detail with the patient and her daughter, and we will try her on Cymbalta to try to help control the pain until we get an answer for her problems, and try to hold off on narcotics at this time, and may have to advance the Lyrica to 200 mg 3 times a day but at her age I am a little leery of that.  She is encouraged to take a multivitamin and vitamin D every day, stay physically mentally active, try to exercise some every day, and try some physical therapy but the patient says no she does not want the therapy she says she can just do her own exercises at home.  Memory is getting worse, we will give her Aricept, I do not think she needs neuropsych testing at her age, and other family members have responded to Aricept and the daughter really would like to try that.  36 minutes minutes spent reviewing her records in detail on the chart, going over all her MRI scans, reviewing all of her previous neurology notes and work-ups from her recent hospitalization and neurology consults and showed the daughter the findings so she will understand also,, and trying to decipher what kind of neuropathy she does have and what test had been done and what needs to be done.  We encouraged him to try to eat a Mediterranean type diet in addition to take the vitamins as above.  We will see her again in 6 months time we will check MyChart for results of her test, or call us for results, and this is a very difficult case with multiple problems, which could be serious and life-threatening if she has a pituitary tumor or has falls or progressive weakness and may be even possibly neuromuscular disease which could be fatal     Signed By: [name], MD     March 20, 2023       CC: Jonnie Kind, MD  FAX: [phone]

## 2023-03-20 NOTE — Telephone Encounter (Signed)
Last appointment: 01/15/23  Next appointment: 04/17/23  Previous refill encounter(s): 12/19/22 Tylenol w/Codeine #60, 09/16/22 Requip #90 with 1 refill    Requested Prescriptions     Pending Prescriptions Disp Refills    rOPINIRole (REQUIP) 0.25 MG tablet [Pharmacy Med Name: ROPINIROLE 0.25MG  TABLETS] 90 tablet 1     Sig: TAKE 1 TABLET BY MOUTH EVERY NIGHT    acetaminophen-codeine (TYLENOL #3) 300-30 MG per tablet [Pharmacy Med Name: ACETAMINOPHEN/COD #3 (300/30MG ) TAB] 60 tablet 0     Sig: Take 1 tablet by mouth every 12 hours as needed for Pain for up to 30 days. Max Daily Amount: 2 tablets         For Pharmacy Admin Tracking Only    Program: Medication Refill  CPA in place:    Recommendation Provided To:   Intervention Detail: New Rx: 2, reason: Patient Preference  Intervention Accepted By:   Eugenia Pancoast Closed?:    Time Spent (min): 5

## 2023-03-21 NOTE — Care Coordination-Inpatient (Addendum)
Ambulatory Care Coordination Note     03/21/2023 1:44 PM     Patient Current Location:  [place]     ACM contacted the patient by telephone. Verified name and DOB with caregiver as identifiers.         ACM: Collier Flowers, RN     Challenges to be reviewed by the provider   Additional needs identified to be addressed with provider No  none               Method of communication with provider: none.    Care Summary Note: ACM was able to speak with patient and niece. Patient is doing better. Has followed up as was scheduled. 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 Patient is able to walk with walker. Tolerating medications well. According to Niece she has new problem with memory loss. Patient was started on Aricept.    Offered patient enrollment in the Remote Patient Monitoring (RPM) program for in-home monitoring: Yes, but did not enroll at this time: limited patient ability to navigate RPM/equipment.     Assessments Completed:       03/21/2023    10:12 AM   Amb Fall Risk Assessment and TUG Test   Do you feel unsteady or are you worried about falling?  yes   2 or more falls in past year? no   Fall with injury in past year? no    ,   Care Coordination Interventions    Referral from Primary Care Provider: No  Suggested Interventions and Community Resources  Fall Risk Prevention: In Process  Disease Specific Clinic: In Process  Disease Association: In Process  Specialty Services Referral: In Process  Other Therapy Services: In Process  Other Services: In Process      ,   Hypertension - Encounter Level          , and   General Assessment              Medications Reviewed:   Patient denies any changes with medications and reports taking all medications as prescribed. and Completed during a previous  call     Advance Care Planning:   Reviewed and current     Care Planning:   Education Documentation  Educate potential activities for participation, taught by Collier Flowers, RN at 03/21/2023  1:40 PM.  Learner: Patient  Readiness: Acceptance  Method: Explanation  Response: Verbalizes Understanding    Educate energy conservation techniques, taught by Collier Flowers, RN at 03/21/2023  1:40 PM.  Learner: Patient  Readiness: Acceptance  Method: Explanation  Response: Verbalizes Understanding    Educate activity level, taught by Collier Flowers, RN at 03/21/2023  1:40 PM.  Learner: Patient  Readiness: Acceptance  Method: Explanation  Response: Verbalizes Understanding    Discuss history of factors that may impair activity tolerance, taught by Collier Flowers, RN at 03/21/2023  1:40 PM.  Learner: Patient  Readiness: Acceptance  Method: Explanation  Response: Verbalizes Understanding    Discuss exercises to increase strength and endurance, taught by Collier Flowers, RN at 03/21/2023  1:40 PM.  Learner: Patient  Readiness: Acceptance  Method: Explanation  Response: Verbalizes Understanding    Educate home safety, taught by Collier Flowers,  RN at 03/21/2023  1:40 PM.  Learner: Patient  Readiness: Acceptance  Method: Explanation  Response: [place]on Gammon Understanding    COGNITIVE : FALLS-RISK OF, taught by Collier Flowers, RN at 03/21/2023  1:40 PM.  Learner: Patient  Readiness: Acceptance  Method: Explanation  Response: [place]on Gammon Understanding    COGNITIVE : FALLS-RISK OF, taught by Collier Flowers, RN at 03/21/2023  1:40 PM.  Learner: Patient  Readiness: Acceptance  Method: Explanation  Response: Verbalizes Understanding    Educate safety precautions, taught by Collier Flowers, RN at 03/21/2023  1:40 PM.  Learner: Patient  Readiness: Acceptance  Method: Explanation  Response: Verbalizes Understanding    Education Comments  No comments found.     ,    Goals Addressed                   This Visit's Progress      Conditions and Symptoms   Improving     I will schedule office visits, as directed by my provider.  I will keep my appointment or reschedule if I have to cancel.  I will notify my provider of any barriers to my plan of care.  I will notify my provider of any symptoms that indicate a worsening of my condition.  Barriers: none  Plan for overcoming my barriers: N/A  Confidence: 8/10  Anticipated Goal Completion Date: 04/20/23      02/01/23  Activity Intolerance-Dementia  Discuss exercises to increase strength and endurance  Discuss history of factors that may impair activity tolerance  Educate activity level  Educate energy conservation techniques  Educate potential activities for participation      02/19/23  Admits to feeling well  Has followed up as scheduled  Next follow up on 03/20/23    03/21/23  Post Hosp admit 1 week ago  Completed Neuro F/U on 03/20/23  Has right foot flaring outward  Using walker to assist with ambulation                 PCP/Specialist follow up:   Future Appointments         Provider Specialty Dept Phone    04/17/2023 11:20 AM Jonnie Kind, MD Family Medicine [phone]    06/10/2023 1:00 PM [facility] 2 Radiology [phone]    09/26/2023 11:00 AM Youlanda Roys, MD Neurology [phone]    11/27/2023 1:40 PM Sabino Snipes, MD General Surgery [phone]            Follow Up:   Plan for next ACM outreach in approximately 2 weeks to complete:  - disease specific assessments  - medication review   - goal progression  - education .   Caregiver is agreeable to this plan.

## 2023-03-25 ENCOUNTER — Emergency Department: Admit: 2023-03-25 | Payer: MEDICARE | Primary: Family Medicine

## 2023-03-25 ENCOUNTER — Inpatient Hospital Stay
Admit: 2023-03-25 | Discharge: 2023-03-26 | Disposition: A | Discharge status: Home / Self Care | Payer: MEDICARE | Attending: Emergency Medicine

## 2023-03-25 DIAGNOSIS — R4182 Altered mental status, unspecified: Secondary | ICD-10-CM

## 2023-03-25 LAB — CBC WITH AUTO DIFFERENTIAL
Basophils %: 1 % (ref 0–1)
Basophils Absolute: 0.1 10*3/uL (ref 0.0–0.1)
Eosinophils %: 3 % (ref 0–7)
Eosinophils Absolute: 0.1 10*3/uL (ref 0.0–0.4)
Hematocrit: 34.6 % — ABNORMAL LOW (ref 35.0–47.0)
Hemoglobin: 10.8 g/dL — ABNORMAL LOW (ref 11.5–16.0)
Immature Granulocytes %: 1 % — ABNORMAL HIGH (ref 0.0–0.5)
Immature Granulocytes Absolute: 0 10*3/uL (ref 0.00–0.04)
Lymphocytes %: 17 % (ref 12–49)
Lymphocytes Absolute: 0.9 10*3/uL (ref 0.8–3.5)
MCH: 22.1 pg — ABNORMAL LOW (ref 26.0–34.0)
MCHC: 31.2 g/dL (ref 30.0–36.5)
MCV: 70.9 FL — ABNORMAL LOW (ref 80.0–99.0)
MPV: 11.3 FL (ref 8.9–12.9)
Monocytes %: 13 % (ref 5–13)
Monocytes Absolute: 0.7 10*3/uL (ref 0.0–1.0)
Neutrophils %: 65 % (ref 32–75)
Neutrophils Absolute: 3.2 10*3/uL (ref 1.8–8.0)
Nucleated RBCs: 0 /100{WBCs}
Platelets: 284 10*3/uL (ref 150–400)
RBC: 4.88 M/uL (ref 3.80–5.20)
RDW: 16.4 % — ABNORMAL HIGH (ref 11.5–14.5)
WBC: 5 10*3/uL (ref 3.6–11.0)
nRBC: 0 10*3/uL (ref 0.00–0.01)

## 2023-03-25 LAB — EXTRA TUBES HOLD

## 2023-03-25 LAB — URINALYSIS WITH MICROSCOPIC
BACTERIA, URINE: NEGATIVE /HPF
Bilirubin, Urine: NEGATIVE
Blood, Urine: NEGATIVE
Glucose, Ur: NEGATIVE mg/dL
Ketones, Urine: NEGATIVE mg/dL
Leukocyte Esterase, Urine: NEGATIVE
Nitrite, Urine: NEGATIVE
Protein, UA: NEGATIVE mg/dL
Specific Gravity, UA: 1.017 (ref 1.003–1.030)
Urobilinogen, Urine: 0.2 EU/dL (ref 0.2–1.0)
pH, Urine: 5.5 (ref 5.0–8.0)

## 2023-03-25 LAB — COMPREHENSIVE METABOLIC PANEL
ALT: 18 U/L (ref 12–78)
AST: 19 U/L (ref 15–37)
Albumin/Globulin Ratio: 0.7 — ABNORMAL LOW (ref 1.1–2.2)
Albumin: 3.2 g/dL — ABNORMAL LOW (ref 3.5–5.0)
Alk Phosphatase: 99 U/L (ref 45–117)
Anion Gap: 4 mmol/L — ABNORMAL LOW (ref 5–15)
BUN/Creatinine Ratio: 34 — ABNORMAL HIGH (ref 12–20)
BUN: 27 mg/dL — ABNORMAL HIGH (ref 6–20)
CO2: 25 mmol/L (ref 21–32)
Calcium: 9.9 mg/dL (ref 8.5–10.1)
Chloride: 110 mmol/L — ABNORMAL HIGH (ref 97–108)
Creatinine: 0.8 mg/dL (ref 0.55–1.02)
Est, Glom Filt Rate: 67 mL/min/{1.73_m2} (ref >60)
Globulin: 4.5 g/dL — ABNORMAL HIGH (ref 2.0–4.0)
Glucose: 107 mg/dL — ABNORMAL HIGH (ref 65–100)
Potassium: 4.5 mmol/L (ref 3.5–5.1)
Sodium: 139 mmol/L (ref 136–145)
Total Bilirubin: 0.2 mg/dL (ref 0.2–1.0)
Total Protein: 7.7 g/dL (ref 6.4–8.2)

## 2023-03-25 LAB — URINE CULTURE HOLD SAMPLE

## 2023-03-25 LAB — EKG 12-LEAD
Atrial Rate: 79 {beats}/min
Diagnosis: NORMAL
P Axis: 48 degrees
P-R Interval: 134 ms
Q-T Interval: 376 ms
QRS Duration: 86 ms
QTc Calculation (Bazett): 431 ms
R Axis: 13 degrees
T Axis: 120 degrees
Ventricular Rate: 79 {beats}/min

## 2023-03-25 LAB — MAGNESIUM: Magnesium: 2.1 mg/dL (ref 1.6–2.4)

## 2023-03-25 MED ORDER — ACETAMINOPHEN 500 MG PO TABS
500 | Freq: Once | ORAL | Status: AC
Start: 2023-03-25 — End: 2023-03-25
  Administered 2023-03-25: 21:00:00 1000 mg via ORAL

## 2023-03-25 MED FILL — ACETAMINOPHEN EXTRA STRENGTH 500 MG PO TABS: 500 MG | ORAL | Qty: 2

## 2023-03-25 NOTE — ED Notes (Addendum)
3:10 PM  I have evaluated the patient as the Provider in Rapid Medical Evaluation (RME). I have reviewed her vital signs and the triage nurse assessment. I have talked with the patient and any available family and advised that I am the provider in triage and have ordered the appropriate study to initiate their work up based on the clinical presentation during my assessment. I have advised that the patient will be accommodated in the Main ED as soon as possible. I have also requested to contact the triage nurse or myself immediately if the patient experiences any changes in their condition during this brief waiting period.  Marcelyn Bruins, PA-C      Kimberly [name] is a 89 y.o. female w/ history of dementia presenting to ED for care taker concern for pt issues with memory, reporting that the patient will think she is someone else.  Associated symptoms of seeing people who are not there, excessive sleep during the day, and staying up at night.  Pt also complains for hoarse voice, sore throat x 1 month.  History of acid reflux.  Worse multiarthritic pain.        Marcelyn Bruins, PA-C  03/25/23 2205       Marcelyn Bruins, PA-C  03/25/23 2205

## 2023-03-25 NOTE — Care Coordination-Inpatient (Signed)
Ambulatory Care Coordination Note     03/25/2023 10:00 AM     Patient Current Location:  [place]     Caregiver, Rene Kocher,  contacted the ACM by telephone. Verified name and DOB with patient as identifiers.         ACM: Collier Flowers, RN     Challenges to be reviewed by the provider   Additional needs identified to be addressed with provider Yes  ACM was on the phone this morning over an hour trying to get appointment verified for this patient.  Still was unable to schedule. Patient has change in mental capacity over the last 2 days.  Patient went to Urgent care at [facility].         Method of communication with provider: chart routing.    Care Summary Note: Call received from niece stating sudden changes in mentality for her aunt. Not remembering her or where she was. Was seen by [facility] on Saturday. ACM stayed on phone about an hour trying to schedule patient today. Was told patient could be seen on tomorrow at [address] location. Then we were put on hold for verification and no one answered the phone. Niece agreed to take patient to [facility], Urgent Care to be evaluated.    Offered patient enrollment in the Remote Patient Monitoring (RPM) program for in-home monitoring: Yes, but did not enroll at this time: limited patient ability to navigate RPM/equipment.     Assessments Completed:       03/21/2023    10:12 AM   Amb Fall Risk Assessment and TUG Test   Do you feel unsteady or are you worried about falling?  yes   2 or more falls in past year? no   Fall with injury in past year? no    ,  ,   Hypertension - Encounter Level    Symptom course: improving      , and   General Assessment              Medications Reviewed:   Patient denies any changes with medications and reports taking all medications as prescribed.    Advance Care Planning:   Reviewed and current     Care Planning:   Education Documentation  No documentation found.  Education Comments  No comments found.     ,    Goals Addressed                   This  Visit's Progress     Conditions and Symptoms   No change     I will schedule office visits, as directed by my provider.  I will keep my appointment or reschedule if I have to cancel.  I will notify my provider of any barriers to my plan of care.  I will notify my provider of any symptoms that indicate a worsening of my condition.  Barriers: none  Plan for overcoming my barriers: N/A  Confidence: 8/10  Anticipated Goal Completion Date: 04/20/23      02/01/23  Activity Intolerance-Dementia  Discuss exercises to increase strength and endurance  Discuss history of factors that may impair activity tolerance  Educate activity level  Educate energy conservation techniques  Educate potential activities for participation      02/19/23  Admits to feeling well  Has followed up as scheduled  Next follow up on 03/20/23    03/21/23  Post Hosp admit 1 week ago  Completed Neuro F/U on 03/20/23  Has  right foot flaring outward  Using walker to assist with ambulation    03/25/23  Niece trying to schedule urgent care visit  Placed on hold and then no answer at office  Patient was seen by [facility] on Saturday  Niece will take to Urgent Care at [facility].               PCP/Specialist follow up:   Future Appointments         Provider Specialty Dept Phone    04/17/2023 11:20 AM Jonnie Kind, MD Family Medicine [phone]    06/10/2023 1:00 PM [facility] 2 Radiology [phone]    09/26/2023 11:00 AM Youlanda Roys, MD Neurology [phone]    11/27/2023 1:40 PM Sabino Snipes, MD General Surgery [phone]            Follow Up:   Plan for next ACM outreach in approximately 2 weeks to complete:  - disease specific assessments  - medication review   - goal progression  - education .   Caregiver is agreeable to this plan.

## 2023-03-25 NOTE — ED Provider Notes (Signed)
[facility] EMERGENCY DEP  EMERGENCY DEPARTMENT ENCOUNTER      Pt Name: Kimberly [name]  MRN: [id number]  Birthdate Jan 29, 1924  Date of evaluation: 03/25/2023  Provider: Hazeline Junker, MD    CHIEF COMPLAINT       Chief Complaint   Patient presents with    Hallucinations    Pharyngitis    Insomnia         HISTORY OF PRESENT ILLNESS   (Location/Symptom, Timing/Onset, Context/Setting, Quality, Duration, Modifying Factors, Severity)  Note limiting factors.   Patient is a 89 year old with a history of dementia who was brought into the emergency department by her caregiver.  Her caregiver reports that she has been having difficulty with her circadian rhythm, sleeping all day and then getting up and walking around a lot as well as having some increasing confusion and fatigue where she is sleeping 12 to 14 hours a day.  Patient has also developed new hallucinations.  The symptoms have been going on for the last couple of weeks though the hallucinations are worsening over last couple of days.    The history is provided by the patient and a caregiver.         Review of External Medical Records:     Nursing Notes were reviewed.    REVIEW OF SYSTEMS    (2-9 systems for level 4, 10 or more for level 5)     Review of Systems    Except as noted above the remainder of the review of systems was reviewed and negative.       PAST MEDICAL HISTORY     Past Medical History:   Diagnosis Date    Adverse effect of anesthesia     slow to wake    Anginal pain (HCC)     Arthritis     Breast cancer (HCC) 03/10/2018     left lumpectomy    Cancer (HCC) 01/2018    breast    Chronic pain     neuropathy    Constipation     GERD (gastroesophageal reflux disease)     Headache     Hearing loss     High cholesterol     Hypertension     Incontinence     Joint pain     Memory disorder     Muscle pain     Osteoporosis     Ringing in the ears     Snoring     Visual disturbance          SURGICAL HISTORY       Past Surgical History:   Procedure Laterality Date     BREAST LUMPECTOMY Left 03/10/2018    LEFT BREAST LUMPECTOMY WITH ULTRASOUND GUIDANCE performed by Sabino Snipes, MD at [facility] AMBULATORY OR    HYSTERECTOMY (CERVIX STATUS UNKNOWN)      ORTHOPEDIC SURGERY      epidural pain mtg - pt unsure    OTHER SURGICAL HISTORY Right 10/15/2016    ligament correction right foot    OTHER SURGICAL HISTORY  11/26/2016    Pituitary tumor removal    US BREAST BIOPSY W LOC DEVICE 1ST LESION LEFT Left 01/21/2018    US BREAST NEEDLE BIOPSY LEFT 01/21/2018 [facility] RAD [place]         CURRENT MEDICATIONS       Discharge Medication List as of 03/25/2023  7:30 PM        CONTINUE these medications which have NOT CHANGED    Details  rOPINIRole (REQUIP) 0.25 MG tablet TAKE 1 TABLET BY MOUTH EVERY NIGHT, Disp-90 tablet, R-1Normal      acetaminophen-codeine (TYLENOL #3) 300-30 MG per tablet Take 1 tablet by mouth every 12 hours as needed for Pain for up to 30 days. Max Daily Amount: 2 tablets, Disp-60 tablet, R-0Normal      Zinc 30 MG CAPS Take by mouthHistorical Med      Omega-3 Fatty Acids (FISH OIL) 1000 MG CPDR Take 3 capsules by mouthHistorical Med      pregabalin (LYRICA) 200 MG capsule TAKE 1 CAPSULE BY MOUTH TWICE DAILY. MAX DAILY AMOUNT: 400 MG, Disp-60 capsule, R-5Normal      cetirizine (ZYRTEC) 5 MG tablet TAKE 1 TABLET BY MOUTH DAILY, Disp-90 tablet, R-1Normal      chlorhexidine (PERIDEX) 0.12 % solution Swish and spit 15 mLs 2 times dailyHistorical Med      carbamide peroxide (DEBROX) 6.5 % otic solution Place 5 drops in ear(s) 2 times dailyHistorical Med      !! DULoxetine (CYMBALTA) 30 MG extended release capsule Take 1 capsule by mouth Daily with supper, Disp-90 capsule, R-1Normal      !! DULoxetine (CYMBALTA) 60 MG extended release capsule Take 1 capsule by mouth daily (with breakfast), Disp-90 capsule, R-1Normal      !! valsartan (DIOVAN) 40 MG tablet Take 1 tablet by mouth nightly, Disp-90 tablet, R-1Normal      !! valsartan (DIOVAN) 80 MG tablet Take 1 tablet by mouth every morning,  Disp-90 tablet, R-1Normal      vitamin E 1000 units capsule Take 1 capsule by mouth dailyHistorical Med      donepezil (ARICEPT) 5 MG tablet Take 1 tablet by mouth daily (before dinner), Disp-30 tablet, R-11Normal      Cholecalciferol (VITAMIN D3) 50 MCG (2000 UT) CAPS Take 1 capsule by mouth dailyHistorical Med      ammonium lactate (LAC-HYDRIN) 12 % lotion Apply 1 Application topically 2 times daily as needed, Topical, 2 TIMES DAILY PRN Starting Thu 03/29/2022, Historical Med      ascorbic acid (VITAMIN C) 100 MG tablet Take 1 tablet by mouth dailyHistorical Med      acetaminophen (TYLENOL) 500 MG tablet Take by mouth every 6 hours as neededHistorical Med      aspirin 81 MG chewable tablet Take by mouth dailyHistorical Med       !! - Potential duplicate medications found. Please discuss with provider.          ALLERGIES     Penicillins and Sulfa antibiotics    FAMILY HISTORY       Family History   Problem Relation Age of Onset    Dementia Sister     Dementia Brother     Cancer Daughter     Breast Cancer Daughter 42    Breast Cancer Sister 59    Breast Cancer Mother 59    No Known Problems Father           SOCIAL HISTORY       Social History     Socioeconomic History    Marital status: Widowed   Tobacco Use    Smoking status: Never    Smokeless tobacco: Never   Vaping Use    Vaping status: Never Used   Substance and Sexual Activity    Alcohol use: Not Currently    Drug use: No     Social Determinants of Health     Financial Resource Strain: Low Risk  (01/19/2023)    Overall  Financial Resource Strain (CARDIA)     Difficulty of Paying Living Expenses: Not very hard   Food Insecurity: No Food Insecurity (01/19/2023)    Hunger Vital Sign     Worried About Running Out of Food in the Last Year: Never true     Ran Out of Food in the Last Year: Never true   Transportation Needs: No Transportation Needs (01/19/2023)    PRAPARE - Therapist, art (Medical): No     Lack of Transportation (Non-Medical): No    Physical Activity: Inactive (01/19/2023)    Exercise Vital Sign     Days of Exercise per Week: 0 days     Minutes of Exercise per Session: 0 min   Stress: Stress Concern Present (01/19/2023)    [employer] of Occupational Health - Occupational Stress Questionnaire     Feeling of Stress : To some extent   Social Connections: Moderately Isolated (01/19/2023)    Social Connection and Isolation Panel [NHANES]     Frequency of Communication with Friends and Family: Three times a week     Frequency of Social Gatherings with Friends and Family: Once a week     Attends Religious Services: 1 to 4 times per year     Active Member of [employer] or Organizations: No     Attends Banker Meetings: Never     Marital Status: Widowed   Intimate Partner Violence: Not At Risk (01/19/2023)    Humiliation, Afraid, Rape, and Kick questionnaire     Fear of Current or Ex-Partner: No     Emotionally Abused: No     Physically Abused: No     Sexually Abused: No   Housing Stability: Low Risk  (01/19/2023)    Housing Stability Vital Sign     Unable to Pay for Housing in the Last Year: No     Number of Places Lived in the Last Year: 1     Unstable Housing in the Last Year: No           PHYSICAL EXAM    (up to 7 for level 4, 8 or more for level 5)     ED Triage Vitals [03/25/23 1514]   BP Systolic BP Percentile Diastolic BP Percentile Temp Temp Source Pulse Respirations SpO2   (!) 171/74 -- -- 97.5 F (36.4 C) Oral 84 20 96 %      Height Weight - Scale         1.524 m (5') 67.6 kg (149 lb 0.5 oz)             Body mass index is 29.11 kg/m.    Physical Exam  Constitutional:       Appearance: Normal appearance. She is not ill-appearing.   HENT:      Head: Normocephalic and atraumatic.      Mouth/Throat:      Mouth: Mucous membranes are moist.   Eyes:      Conjunctiva/sclera: Conjunctivae normal.   Cardiovascular:      Rate and Rhythm: Normal rate and regular rhythm.      Heart sounds: Normal heart sounds.   Pulmonary:      Effort:  Pulmonary effort is normal.      Breath sounds: Normal breath sounds.   Abdominal:      General: There is no distension.      Palpations: Abdomen is soft.      Tenderness: There is no abdominal tenderness.  Skin:     General: Skin is warm and dry.   Neurological:      General: No focal deficit present.      Mental Status: She is alert and oriented to person, place, and time.   Psychiatric:         Mood and Affect: Mood normal.         Behavior: Behavior normal.         Thought Content: Thought content normal.         Judgment: Judgment normal.         DIAGNOSTIC RESULTS     EKG: All EKG's are interpreted by the Emergency Department Physician who either signs or Co-signs this chart in the absence of a cardiologist.        RADIOLOGY:   Non-plain film images such as CT, Ultrasound and MRI are read by the radiologist. Plain radiographic images are visualized and preliminarily interpreted by the emergency physician with the below findings:        Interpretation per the Radiologist below, if available at the time of this note:    CT HEAD WO CONTRAST   Final Result   No acute process seen. Stable central atrophy and known pituitary macroadenoma         Electronically signed by Steele Sizer      XR CHEST PORTABLE   Final Result   No Acute Disease.         Electronically signed by Carrolyn Leigh           LABS:  Labs Reviewed   CBC WITH AUTO DIFFERENTIAL - Abnormal; Notable for the following components:       Result Value    Hemoglobin 10.8 (*)     Hematocrit 34.6 (*)     MCV 70.9 (*)     MCH 22.1 (*)     RDW 16.4 (*)     Immature Granulocytes % 1 (*)     All other components within normal limits   COMPREHENSIVE METABOLIC PANEL - Abnormal; Notable for the following components:    Chloride 110 (*)     Anion Gap 4 (*)     Glucose 107 (*)     BUN 27 (*)     BUN/Creatinine Ratio 34 (*)     Albumin 3.2 (*)     Globulin 4.5 (*)     Albumin/Globulin Ratio 0.7 (*)     All other components within normal limits   POCT BLOOD GAS &  ELECTROLYTES - Abnormal; Notable for the following components:    PCO2, Venus, POC 40.0 (*)     POC TCO2 26 (*)     POC Ionized Calcium 1.33 (*)     All other components within normal limits   URINE CULTURE HOLD SAMPLE   EXTRA TUBES HOLD   URINALYSIS WITH MICROSCOPIC   MAGNESIUM   BLOOD GAS, VENOUS       All other labs were within normal range or not returned as of this dictation.    EMERGENCY DEPARTMENT COURSE and DIFFERENTIAL DIAGNOSIS/MDM:   Vitals:    Vitals:    03/26/23 0130 03/26/23 0230 03/26/23 0400 03/26/23 0410   BP: (!) 165/73 (!) 151/76 (!) 182/91    Pulse: 67 69 70 69   Resp: 14 15 19 17    Temp:       TempSrc:       SpO2: 94% 95% 95% 95%   Weight:       Height:  Medical Decision Making      The patient is resting comfortably and feels better, is alert, talkative, interactive and in no distress. The repeat examination is unremarkable and benign. The history, exam, diagnostic testing (if any) and the patient's current condition do not suggest arrhythmia, STEMI, seizure, meningitis, stroke, sepsis, subarachnoid hemorrhage, intracranial bleeding, encephalitis, encephalopathy, or other significant pathology that would warrant further testing, continued ED treatment, admission, neurological consultation, or other specialist evaluation at this point. The vital signs have been stable. The patient's condition is stable and appropriate for discharge. The patient will pursue further outpatient evaluation with the primary care physician or other designated or consulting physician as indicated in the discharge instructions.      REASSESSMENT     ED Course as of 03/26/23 1552   Mon Mar 25, 2023   1548 ED EKG interpretation:  Rhythm: normal sinus rhythm. Rate (approx.): 79.  Axis: normal.  ST segment:  No concerning ST elevations or depressions. This EKG was interpreted by Louann Sjogren, MD,ED Provider.   [JM]      ED Course User Index  [JM] Terrall Laity, MD            CONSULTS:  None    PROCEDURES:  Unless otherwise noted below, none     Procedures      FINAL IMPRESSION      1. Altered mental status, unspecified altered mental status type          DISPOSITION/PLAN   DISPOSITION Decision To Discharge 03/25/2023 07:18:51 PM      PATIENT REFERRED TO:  Jonnie Kind, MD  [address] [place] [address]  [phone]    Schedule an appointment as soon as possible for a visit         DISCHARGE MEDICATIONS:  Discharge Medication List as of 03/25/2023  7:30 PM            (Please note that portions of this note were completed with a voice recognition program.  Efforts were made to edit the dictations but occasionally words are mis-transcribed.)    Hazeline Junker, MD (electronically signed)  Emergency Attending Physician / Physician Assistant / Nurse Practitioner              Carolyn Stare, MD  03/26/23 1600

## 2023-03-25 NOTE — ED Triage Notes (Addendum)
Pt reports "breathing up a little phlegm. Hoarse voice and sore throat since the end of July" Sleeping about 12-15 hours a day. And then the last 3 days. Pt has been waking up at 4am and going to bed around midnight. Pt ambulates with walker. Denies any recent falls. Pt has right arm and bilateral calf pain. Increased memory issues. New hallucinations starting last night.

## 2023-03-26 LAB — POCT BLOOD GAS & ELECTROLYTES
Anion Gap, POC: 12 (ref 10–20)
Base Excess: 1.2 mmol/L
HCO3, Arterial: 26 mmol/L
PCO2, Venus, POC: 40 mmHg — ABNORMAL LOW (ref 41–51)
PH, VENOUS (POC): 7.42 (ref 7.32–7.42)
PO2, VENOUS (POC): <27 mmHg (ref 25–40)
POC Chloride: 107 MMOL/L (ref 100–108)
POC Creatinine: 0.8 mg/dL (ref 0.6–1.3)
POC Ionized Calcium: 1.33 mmol/L — ABNORMAL HIGH (ref 1.12–1.32)
POC Lactic Acid: 1.11 mmol/L (ref 0.40–2.00)
POC Potassium: 4.3 MMOL/L (ref 3.5–5.5)
POC Sodium: 145 MMOL/L (ref 136–145)
POC TCO2: 26 MMOL/L — ABNORMAL HIGH (ref 19–24)
eGFR, POC: 67 mL/min/{1.73_m2} (ref >60)

## 2023-03-28 ENCOUNTER — Ambulatory Visit: Admit: 2023-03-28 | Discharge: 2023-03-28 | Payer: MEDICARE | Attending: Family Medicine | Primary: Family Medicine

## 2023-03-28 DIAGNOSIS — I10 Essential (primary) hypertension: Secondary | ICD-10-CM

## 2023-03-28 MED ORDER — VALSARTAN 160 MG PO TABS
160 MG | ORAL_TABLET | Freq: Every day | ORAL | 1 refills | Status: DC
Start: 2023-03-28 — End: 2023-07-01

## 2023-03-28 NOTE — Assessment & Plan Note (Signed)
Patient having sundowning episodes and also becoming delirious. We discussed constant redirecting with caregiver. Also review medications and recent ER labs. We discussed considering a nursing home facility but patient does not want to be in a nursing home or assisted living per caregiver.  Will consider low dose Haldol if persistent or worsening but holding for now given potential side effects and increase risk of mortality. This was discussed with caregiver as well.

## 2023-03-28 NOTE — Progress Notes (Signed)
[facility]  [facility]  [phone]. [address]  [place], [place] [address]  [phone].    C/C: ER follow up    Subjective  Kimberly [name] is a 89 y.o. Black / [place] American female , established patient, here for evaluation of the concern(s) below;    PMHx: HTN, PVD, pituitary mirroadenoma S/P surgery (follows with Dr. Arville Lime), DM with peripheral neuropathy, DDD , CKD 3a, Malignant neoplasm of left breast, Dementia with behavioral disturbance (follows with neuro), and HLD who presents for follow up of chronic problems;     Accompanied By: Niece Rene Kocher):    ER follow up:  Pt was evaluated in the ER (03/25/2023).  Per caregiver, pt has had some behavioral disturbances over the past few nights. Caregiver states that pt has had difficulties with her circadian rhythm and getting more confused.  Niece was concerned that patient could not recognize her during those episodes.    Chronic medical problems:    HTN:  Pt on valsartan 80mg  daily in the morning and 40mg  at night.  Caregiver states that he BP has been much better with this dose even during PT sessions.     Neuropathy:  On Lyrica 200mg  BID for neuropathy managed by neurology.     Uses a Rolator walker when needed     Pt denies any  fever, chill, chest pain, SOB, abdominal pain, n/v/d, HA or dizziness.      Other Health Habits and social history:  Her only daughter passed away.     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       Allergies - reviewed:   Allergies   Allergen Reactions    Penicillins Hives and Myalgia    Sulfa Antibiotics Hives     Other reaction(s): Unknown (comments)       Past Medical History - reviewed:  Past Medical History:   Diagnosis Date    Adverse effect of anesthesia     slow to wake    Anginal pain (HCC)     Arthritis     Breast cancer (HCC) 03/10/2018     left lumpectomy    Cancer (HCC) 01/2018    breast     Chronic pain     neuropathy    Constipation     GERD (gastroesophageal reflux disease)     Headache     Hearing loss     High cholesterol     Hypertension     Incontinence     Joint pain     Memory disorder     Muscle pain     Osteoporosis     Ringing in the ears     Snoring     Visual disturbance        Depression screening:  No data recorded      Review of systems:   A comprehensive review of systems was negative except for that written in the History of Present Illness.     Physical Exam  BP (!) 165/77   Pulse 79   Temp 97.6 F (36.4 C) (Temporal)   Resp 16   Ht 1.524 m (5')   Wt 68.2 kg (150 lb 5.7 oz)   SpO2 98%   BMI 29.36 kg/m     General: Alert and oriented, in no acute distress.  SKIN: No rash. No suspicious lesions or moles.  EYE:  PERRL. Sclera and conjuctival clear. Extraocular movements intact.  EARS: External normal, canals clear, tympanic membranes normal.   NOSE: Mucosa healthy without drainage or ulceration.  OROPHARYNX: No suspicious lesions, normal dentition, pharynx, tongue and tonsils normal.  NECK: Supple; no masses; thyroid normal.  LUNGS: Respirations unlabored; clear to auscultation bilaterally.  CARDIOVASCULAR: Regular, rate, and rhythm without murmurs, gallops or rubs.  Neurological: Alert and oriented X 3, normal strength and tone. Use a rollator walker     Assessment/Plan.  1. Primary hypertension  -     valsartan (DIOVAN) 160 MG tablet; Take 1 tablet by mouth daily, Disp-90 tablet, R-1Normal  2. Dementia with behavioral disturbance [facility])  Assessment & Plan:   Patient having sundowning episodes and also becoming delirious. We discussed constant redirecting with caregiver. Also review medications and recent ER labs. We discussed considering a nursing home facility but patient does not want to be in a nursing home or assisted living per caregiver.  Will consider low dose Haldol if persistent or worsening but holding for now given potential side effects and increase risk of  mortality. This was discussed with caregiver as well.          Follow up: 3 months. RTC to clinic sooner should symptoms persist, worsen or fail to improve as anticipated.    On this date 03/28/23 I have spent 25 minutes reviewing previous notes, test results and face to face with the patient discussing the diagnosis and importance of compliance with the treatment plan as well as documenting on the day of the visit.       We discussed the expected course, resolution and complications of the diagnosis(es) in detail.  Medication risks, benefits, costs, interactions, and alternatives were discussed as indicated.  I advised to contact the office if his condition worsens, changes or fails to improve as anticipated. Pt expressed understanding with the diagnosis(es) and plan. Patient understands that this encounter was a temporary measure, and the importance of further follow up and examination was emphasized.  Patient verbalized understanding.      Signed By: Rene Paci, MD     March 28, 2023

## 2023-03-28 NOTE — Progress Notes (Signed)
Chief Complaint   Patient presents with    ED Follow-up     03/25/2023 - 03/26/2023 (12 hours)  [facility] EMERGENCY DEP  Altered mental status, unspecified altered mental status type       Arm Pain     Right on & off x 1 year     Accompanied by: Niece     "Have you been to the ER, urgent care clinic since your last visit?  Hospitalized since your last visit?"      Yes  03/25/2023 - 03/26/2023 (12 hours)  [facility] EMERGENCY DEP  Altered mental status, unspecified altered mental status type       "Have you seen or consulted any other health care providers outside of [facility] since your last visit?"    NO      Vitals:    03/28/23 1014   BP: (!) 165/77   Pulse:    Resp:    Temp:    SpO2:       Health Maintenance Due   Topic Date Due    Shingles vaccine (1 of 2) Never done    Respiratory Syncytial Virus (RSV) Pregnant or age 22 yrs+ (1 - 1-dose 60+ series) Never done    Pneumococcal 65+ years Vaccine (2 of 2 - PCV) 08/19/2015    COVID-19 Vaccine (4 - 2023-24 season) 06/25/2022    Flu vaccine (1) 03/07/2023        The patient, Kimberly [name], identity was verified by name and DOB.

## 2023-04-09 NOTE — Telephone Encounter (Signed)
 Kimberly [name] would like a call back at [phone] to discuss a medication for patient.

## 2023-04-09 NOTE — Care Coordination (Signed)
 Ambulatory Care Coordination Note     04/09/2023      Patient Current Location:  [place]      ACM contacted the family by telephone. Verified name and DOB with family as identifiers.         ACM: Tilton CHRISTELLA Bailey, RN     Challenges to be reviewed by the provider   Additional needs identified to be addressed with provider No  none               Method of communication with provider: none.    Care Summary Note: According to niece, patient has become more confuse and has more memory problems. [facility] out to see for complaint of right arm pain, denies fall. Walks with walker. Patient has been waking up around 4 AM and staying up until 12 MN. BP has been elevated. Some hallucinations according to niece. Head CT negative, BS 107. On Lyrica  200mg  BID for neuropathy managed by neurology. Continue to take Diovan  160 mg daily for BP.    Offered patient enrollment in the Remote Patient Monitoring (RPM) program for in-home monitoring: Yes, but did not enroll at this time: limited patient ability to navigate RPM/equipment.     Assessments Completed:       04/09/2023     8:25 PM   Amb Fall Risk Assessment and TUG Test   Do you feel unsteady or are you worried about falling?  yes   2 or more falls in past year? no   Fall with injury in past year? no    ,   Care Coordination Interventions    Referral from Primary Care Provider: No  Suggested Interventions and Community Resources  Fall Risk Prevention: In Process  Disease Specific Clinic: In Process  Disease Association: In Process  Specialty Services Referral: In Process  Other Therapy Services: In Process  Other Services: In Process  Zone Management Tools: In Process      ,   Hypertension - Encounter Level    Symptom course: improving      ,   General Assessment    Do you have any symptoms that are causing concern?: No          Medications Reviewed:   Completed during this call    Advance Care Planning:   Reviewed and current     Care Planning:   Education  Documentation  Teach reporting changes in condition, taught by Bailey Tilton CHRISTELLA, RN at 04/09/2023  8:25 PM.  Learner: Family  Readiness: Acceptance  Method: Explanation  Response: Verbalizes Understanding    Discuss alcohol intake, taught by Bailey Tilton CHRISTELLA, RN at 04/09/2023  8:25 PM.  Learner: Family  Readiness: Acceptance  Method: Explanation  Response: Verbalizes Understanding    Discuss information regarding benefits of regular exercise, taught by Bailey Tilton CHRISTELLA, RN at 04/09/2023  8:25 PM.  Learner: Family  Readiness: Acceptance  Method: Explanation  Response: Verbalizes Understanding    Discuss information about weight loss, taught by Bailey Tilton CHRISTELLA, RN at 04/09/2023  8:25 PM.  Learner: Family  Readiness: Acceptance  Method: Explanation  ResponseBETHA [place] Understanding    Teach appropriate dietary choices, taught by Bailey Tilton CHRISTELLA, RN at 04/09/2023  8:25 PM.  Learner: Family  Readiness: Acceptance  Method: Explanation  Response: Verbalizes Understanding    Discuss smoking cessation, taught by Bailey Tilton CHRISTELLA, RN at 04/09/2023  8:25 PM.  Learner: Family  Readiness: Acceptance  Method: Explanation  ResponseBETHA [place] Understanding  Discuss recommended lifestyle changes, taught by Arnaldo Tilton HERO, RN at 04/09/2023  8:25 PM.  Learner: Family  Readiness: Acceptance  Method: Explanation  Response: Verbalizes Understanding    Teach compliance with prescribed medication regimen, taught by Arnaldo Tilton HERO, RN at 04/09/2023  8:25 PM.  Learner: Family  Readiness: Acceptance  Method: Explanation  Response: Verbalizes Understanding    Discuss information regarding technique to measure blood pressure, taught by Arnaldo Tilton HERO, RN at 04/09/2023  8:25 PM.  Learner: Family  Readiness: Acceptance  Method: Explanation  ResponseBETHA [place] Understanding    Teach information regarding medications, taught by Arnaldo Tilton HERO, RN at 04/09/2023  8:25 PM.  Learner: Family  Readiness: Acceptance  Method:  Explanation  Response: Verbalizes Understanding    Teach importance of blood pressure control, taught by Arnaldo Tilton HERO, RN at 04/09/2023  8:25 PM.  Learner: Family  Readiness: Acceptance  Method: Explanation  Response: Verbalizes Understanding    Educate potential activities for participation, taught by Arnaldo Tilton HERO, RN at 04/09/2023  8:25 PM.  Learner: Family  Readiness: Acceptance  Method: Explanation  Response: Verbalizes Understanding    Educate energy conservation techniques, taught by Arnaldo Tilton HERO, RN at 04/09/2023  8:25 PM.  Learner: Family  Readiness: Acceptance  Method: Explanation  Response: Verbalizes Understanding    Educate activity level, taught by Arnaldo Tilton HERO, RN at 04/09/2023  8:25 PM.  Learner: Family  Readiness: Acceptance  Method: Explanation  Response: Verbalizes Understanding    Discuss history of factors that may impair activity tolerance, taught by Arnaldo Tilton HERO, RN at 04/09/2023  8:25 PM.  Learner: Family  Readiness: Acceptance  Method: Explanation  Response: Verbalizes Understanding    Discuss exercises to increase strength and endurance, taught by Arnaldo Tilton HERO, RN at 04/09/2023  8:25 PM.  Learner: Family  Readiness: Acceptance  Method: Explanation  Response: Verbalizes Understanding    Educate home safety, taught by Arnaldo Tilton HERO, RN at 04/09/2023  8:25 PM.  Learner: Family  Readiness: Acceptance  Method: Explanation  Response: Verbalizes Understanding    COGNITIVE : FALLS-RISK OF, taught by Arnaldo Tilton HERO, RN at 04/09/2023  8:25 PM.  Learner: Family  Readiness: Acceptance  Method: Explanation  Response: Verbalizes Understanding    COGNITIVE : FALLS-RISK OF, taught by Arnaldo Tilton HERO, RN at 04/09/2023  8:25 PM.  Learner: Family  Readiness: Acceptance  Method: Explanation  Response: Verbalizes Understanding    Educate safety precautions, taught by Arnaldo Tilton HERO, RN at 04/09/2023  8:25 PM.  Learner: Family  Readiness: Acceptance  Method: Explanation  Response:  Verbalizes Understanding    Education Comments  No comments found.     ,    Goals Addressed                   This Visit's Progress     Conditions and Symptoms   Improving     I will schedule office visits, as directed by my provider.  I will keep my appointment or reschedule if I have to cancel.  I will notify my provider of any barriers to my plan of care.  I will notify my provider of any symptoms that indicate a worsening of my condition.  Barriers: none  Plan for overcoming my barriers: N/A  Confidence: 8/10  Anticipated Goal Completion Date: 04/20/23      02/01/23  Activity Intolerance-Dementia  Discuss exercises to increase strength and endurance  Discuss history of factors that may impair activity tolerance  Educate activity level  Educate energy conservation techniques  Educate potential activities for participation      02/19/23  Admits to feeling well  Has followed up as scheduled  Next follow up on 03/20/23    03/21/23  [facility] admit 1 week ago  Completed Neuro F/U on 03/20/23  Has right foot flaring outward  Using walker to assist with ambulation    03/25/23  Niece trying to schedule urgent care visit  Placed on hold and then no answer at office  Patient was seen by [facility] on Saturday  Niece will take to Urgent Care at [facility].    04/09/23  Increased memory issues  Wakes up around 4 AM-12 MN  Niece says her memory is getting worse  Walks with walker  ED on 03/25/23  OV follow up on 03/28/23               PCP/Specialist follow up:   Future Appointments         Provider Specialty Dept Phone    04/17/2023 11:20 AM Kennie Darin MATSU, MD Family Medicine [phone]    06/10/2023 1:00 PM [facility] 2 Radiology [phone]    09/26/2023 11:00 AM Claudene Debby LABOR, MD Neurology [phone]    11/27/2023 1:40 PM Lucy Ozell PARAS, MD General Surgery [phone]            Follow Up:   Plan for next ACM outreach in approximately 2 weeks to complete:  - disease specific assessments  - goal progression  - education .   Caregiver is  agreeable to this plan.

## 2023-04-10 NOTE — Telephone Encounter (Signed)
 Phylis Mcclaskey called you back   Dr Domenica Reamer office

## 2023-04-10 NOTE — Telephone Encounter (Signed)
Returned call to pt.  Left voice message.

## 2023-04-17 ENCOUNTER — Ambulatory Visit
Admit: 2023-04-17 | Discharge: 2023-04-17 | Payer: Medicare Other | Attending: Family Medicine | Primary: Family Medicine

## 2023-04-17 VITALS — BP 124/77 | HR 81 | Temp 97.60000°F | Resp 15 | Ht 60.0 in | Wt 145.9 lb

## 2023-04-17 DIAGNOSIS — Z7689 Persons encountering health services in other specified circumstances: Secondary | ICD-10-CM

## 2023-04-17 MED ORDER — AZITHROMYCIN 250 MG PO TABS
250 | ORAL_TABLET | ORAL | 0 refills | Status: AC
Start: 2023-04-17 — End: 2023-04-27

## 2023-04-17 MED ORDER — PREDNISONE 20 MG PO TABS
20 | ORAL_TABLET | Freq: Every day | ORAL | 0 refills | Status: AC
Start: 2023-04-17 — End: 2023-04-24

## 2023-04-17 NOTE — Assessment & Plan Note (Signed)
 Chronic, at goal (stable), continue current treatment plan

## 2023-04-17 NOTE — Progress Notes (Signed)
 Chief Complaint   Patient presents with    3 Month Follow-Up     01/15/2023 Hypertension    Hoarse     Since the end of July  2024     Accompanied by: Niece     Was caught in the rain 03/03/2023 and still has hoarseness. Niece stated she see's her coughing at night and nasal drainage in the Am. Patient stated she doesn't feel sick.    Will wait for Dr. Terrea to see if Flu vaccine can still be given.      Have you been to the ER, urgent care clinic since your last visit?  Hospitalized since your last visit?    NO    "Have you seen or consulted any other health care providers outside of [facility] since your last visit?"    NO      Vitals:    04/17/23 1146   BP: 124/77   Pulse: 81   Resp: 15   Temp: 97.6 F (36.4 C)   SpO2: 96%      Health Maintenance Due   Topic Date Due    Shingles vaccine (1 of 2) Never done    Respiratory Syncytial Virus (RSV) Pregnant or age 66 yrs+ (1 - 1-dose 60+ series) Never done    Pneumococcal 65+ years Vaccine (2 of 2 - PCV) 08/19/2015    Flu vaccine (1) 03/07/2023    COVID-19 Vaccine (4 - 2023-24 season) 04/07/2023        The patient, Kimberly [name], identity was verified by name and DOB.     After obtaining consent, and per orders of Dr. Kennie, injection of   Influenza, FLUAD Trivalent,    given in Left deltoid by Madia Carvell L Ladelle Teodoro, MA. Patient instructed to remain in clinic for 20 minutes afterwards, and to report any adverse reaction to me immediately.

## 2023-04-17 NOTE — Assessment & Plan Note (Signed)
-  Continue conservative measures; Tylenol /NSAIDs for pain, Cepacol throat lozenges for sore throat, Mucinex DM  twice daily for cough, Flonase/saline rinse for congestion  - Discussed precautions with patient    - RTC prn for review if persistent symptoms, or go to the ER if worsening

## 2023-04-17 NOTE — Progress Notes (Signed)
 [facility]  [facility]  [phone]. [address]  [place], [place] [address]  [phone]    C/C: Acute/chronic medical problems    Subjective  Kimberly [name] is a 89 y.o. Black / African American female , established patient, here for evaluation of the concern(s) below;    PMHx: HTN, PVD, pituitary mirroadenoma S/P surgery (follows with Dr. Markel), DM with peripheral neuropathy, DDD , CKD 3a, Malignant neoplasm of left breast, Dementia with behavioral disturbance (follows with neuro), and HLD who presents for follow up of chronic problems;     Accompanied By: Niece Genette):    Hoarseness:  Ongoing for about 6 weeks per niece.  States that they were caught up in the rain prior to onset of symptoms.  She has on and off cough. Also has sinus drainage and sneezing per niece.  Gets zyrtec  daily.  Also getting home remedies like tea and vitamin C .     Chronic medical problems:    HTN:  Pt on valsartan  160mg  which have helped tremendously with her headache and BP now under control.     Neuropathy:  On Lyrica  200mg  BID for neuropathy managed by neurology.  Gets PT twice weekly.    Uses a Rolator walker when needed.     Pt denies any  fever, chill, chest pain, SOB, abdominal pain, n/v/d, HA or dizziness.      Other Health Habits and social history:  Her only daughter passed away.     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    Allergies - reviewed:   Allergies   Allergen Reactions    Penicillins Hives and Myalgia    Sulfa Antibiotics Hives     Other reaction(s): Unknown (comments)       Past Medical History - reviewed:  Past Medical History:   Diagnosis Date    Adverse effect of anesthesia     slow to wake    Anginal pain (HCC)     Arthritis     Breast cancer (HCC) 03/10/2018     left lumpectomy    Cancer (HCC) 01/2018    breast    Chronic pain     neuropathy    Constipation     GERD  (gastroesophageal reflux disease)     Headache     Hearing loss     High cholesterol     Hypertension     Incontinence     Joint pain     Memory disorder     Muscle pain     Osteoporosis     Ringing in the ears     Snoring     Visual disturbance        Depression screening:  No data recorded    Review of systems:   A comprehensive review of systems was negative except for that written in the History of Present Illness.     Physical Exam  BP 124/77   Pulse 81   Temp 97.6 F (36.4 C) (Temporal)   Resp 15   Ht 1.524 m (5')   Wt 66.2 kg (145 lb 15.1 oz)   SpO2 96%   BMI 28.50 kg/m     General: Alert and oriented, in no acute distress. Mild hoarness  SKIN: No rash. No suspicious lesions or moles.  EYE: PERRL. Sclera and conjuctival clear. Extraocular movements intact.  EARS: External normal,  canals clear, tympanic membranes normal.   NOSE: Mucosa healthy without drainage or ulceration.  OROPHARYNX: No suspicious lesions, normal dentition, pharynx, tongue and tonsils normal.  NECK: Supple; no masses; thyroid normal.  LYMPH NODES: No significant cervial lymphadenopathy.  LUNGS: Respirations unlabored; clear to auscultation bilaterally.  CARDIOVASCULAR: Regular, rate, and rhythm without murmurs, gallops or rubs.  ABDOMEN: Soft; nontender; nondistended; normoactive bowel sounds; no masses or organomegaly.  MUSCULOSKELETAL: FROM in all extremities     EXT: No edema. Neurovascularlly intact. Normal gait.  Neurological: Alert and oriented X 3, normal strength and tone. Normal symmetric reflexes. Normal coordination and gait     Assessment/Plan  1. Primary hypertension  Assessment & Plan:   Chronic, at goal (stable), continue current treatment plan  2. Acute bronchitis, unspecified organism  Assessment & Plan:  -Continue conservative measures; Tylenol /NSAIDs for pain, Cepacol throat lozenges for sore throat, Mucinex DM  twice daily for cough, Flonase/saline rinse for congestion  - Discussed precautions with patient    -  RTC prn for review if persistent symptoms, or go to the ER if worsening      Orders:  -     azithromycin  (ZITHROMAX ) 250 MG tablet; 500mg  on day 1 followed by 250mg  on days 2 - 5, Disp-6 tablet, R-0Normal  -     predniSONE  (DELTASONE ) 20 MG tablet; Take 1 tablet by mouth daily for 7 days, Disp-7 tablet, R-0Normal  3. Needs flu shot  -     Influenza, FLUAD Trivalent, (age 14 y+), IM, Preservative Free, 0.5mL         Follow up: 3 months. RTC to clinic sooner should symptoms persist, worsen or fail to improve as anticipated.    We discussed the expected course, resolution and complications of the diagnosis(es) in detail.  Medication risks, benefits, costs, interactions, and alternatives were discussed as indicated.  I advised to contact the office if his condition worsens, changes or fails to improve as anticipated. Pt expressed understanding with the diagnosis(es) and plan. Patient understands that this encounter was a temporary measure, and the importance of further follow up and examination was emphasized.  Patient verbalized understanding.      Signed By: Darin Inetta Bumps, MD     April 17, 2023
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
## 2023-05-19 ENCOUNTER — Encounter

## 2023-05-21 MED ORDER — ACETAMINOPHEN-CODEINE 300-30 MG PO TABS
300-30 MG | ORAL_TABLET | Freq: Two times a day (BID) | ORAL | 0 refills | Status: AC | PRN
Start: 2023-05-21 — End: 2023-06-20

## 2023-05-21 NOTE — Telephone Encounter (Signed)
Last appointment: 04/17/23  Next appointment: 07/17/23  Previous refill encounter(s): 03/20/23 #60    Requested Prescriptions     Pending Prescriptions Disp Refills    acetaminophen-codeine (TYLENOL #3) 300-30 MG per tablet [Pharmacy Med Name: ACETAMINOPHEN/COD #3 (300/30MG ) TAB] 60 tablet 0     Sig: Take 1 tablet by mouth every 12 hours as needed for Pain for up to 30 days. Max Daily Amount: 2 tablets         For Pharmacy Admin Tracking Only    Program: Medication Refill  CPA in place:    Recommendation Provided To:   Intervention Detail: New Rx: 1, reason: Patient Preference  Intervention Accepted By:   Eugenia Pancoast Closed?:    Time Spent (min): 5

## 2023-05-23 NOTE — Progress Notes (Signed)
Faxed to [facility] Form [phone]

## 2023-05-24 NOTE — Care Coordination-Inpatient (Signed)
Ambulatory Care Management Note    Date/Time:  05/24/2023 8:48 AM    Patient Current Location: [place]     Patient has graduated from the Ambulatory Care Management program on 05/24/2023.  Patient/family has the ability to self-manage at this time.  Care management goals have been completed. No further Ambulatory Care Manager follow up scheduled.     Goals Addressed                   This Visit's Progress     COMPLETED: Conditions and Symptoms   Improving     I will schedule office visits, as directed by my provider.  I will keep my appointment or reschedule if I have to cancel.  I will notify my provider of any barriers to my plan of care.  I will notify my provider of any symptoms that indicate a worsening of my condition.  Barriers: none  Plan for overcoming my barriers: N/A  Confidence: 8/10  Anticipated Goal Completion Date: 04/20/23      02/01/23  Activity Intolerance-Dementia  Discuss exercises to increase strength and endurance  Discuss history of factors that may impair activity tolerance  Educate activity level  Educate energy conservation techniques  Educate potential activities for participation      02/19/23  Admits to feeling well  Has followed up as scheduled  Next follow up on 03/20/23    03/21/23  Post Hosp admit 1 week ago  Completed Neuro F/U on 03/20/23  Has right foot flaring outward  Using walker to assist with ambulation    03/25/23  Niece trying to schedule urgent care visit  Placed on hold and then no answer at office  Patient was seen by [facility] on Saturday  Niece will take to Urgent Care at [facility].    04/09/23  Increased memory issues  Wakes up around 4 AM-12 MN  Niece says her memory is getting worse  Walks with walker  ED on 03/25/23  OV follow up on 03/28/23       COMPLETED: Remain Safe at Home   On track     04/24/23   Patient will demonstrate improved memoryretention and recall of important information  Patient will maintain safety and remain free frominjury while in the care of the healthcare  team  Patient will use effective communicationstrategies to express needs and desires  Patient will engage in meaningful socialinteractions with caregivers and other individuals  Patient will consume a balanced diet and maintainadequate hydration levels  Caregiver will express feelings of reduced strainand increased confidence               Patient has Ambulatory Care Manager's contact information for any further questions, concerns, or needs.  Patients upcoming visits:    Future Appointments   Date Time Provider Department Center   07/17/2023 11:20 AM Jonnie Kind, MD [facility] DEP   07/18/2023 11:00 AM [facility] 2 [facility]   09/26/2023 11:00 AM Youlanda Roys, MD [facility] AMB   11/27/2023  1:40 PM Sabino Snipes, MD [facility] BS AMB

## 2023-06-10 ENCOUNTER — Ambulatory Visit: Payer: MEDICARE | Primary: Family Medicine

## 2023-06-30 ENCOUNTER — Encounter

## 2023-07-01 ENCOUNTER — Encounter: Admit: 2023-07-01 | Admitting: Family Medicine

## 2023-07-01 DIAGNOSIS — I10 Essential (primary) hypertension: Secondary | ICD-10-CM

## 2023-07-03 MED ORDER — VALSARTAN 160 MG PO TABS
160 | ORAL_TABLET | Freq: Every day | ORAL | 1 refills | Status: DC
Start: 2023-07-03 — End: 2024-03-13

## 2023-07-12 NOTE — Consults (Signed)
 Consult report has been made available in writing to the requesting provider. I have reviewed the patient's EMR.  Requesting provider: Lorayne Bender, MD   Consult seen by: Celestia Khat, MD    Time consult placed: 2248  Time consult seen: 2310    Asses

## 2023-07-12 NOTE — ED Notes (Signed)
 [facility] NOTIFICATION 07/12/2023 18:19 Jian, Antonette L DOB: Dec 16, 1923 MRN: [id number]    [facility] patient encounter information:   [id number]  Account [id number]  Billing Account [id number]      Criteria Met    5+ ED Visits

## 2023-07-12 NOTE — ED Provider Notes (Signed)
-------------------------------------------------------------------------------    Attestation signed by Lynnae Sandhoff, DO at 07/17/2023  6:46 AM  Teaching Provider Attestation: I personally saw and evaluated the patient. I repeated the critical/key p

## 2023-07-12 NOTE — Procedures (Signed)
 Procedure  ECG 12 lead    Performed by: Lynnae Sandhoff, DO  Authorized by: Lynnae Sandhoff, DO    ECG interpreted by ED Physician in the absence of a cardiologist: yes    Rate:     ECG rate assessment: normal    Rhythm:     Rhythm: sinus rhythm
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
## 2023-07-17 ENCOUNTER — Ambulatory Visit: Admit: 2023-07-17 | Payer: MEDICARE | Attending: Family Medicine | Admitting: Family Medicine | Primary: Family Medicine

## 2023-07-17 VITALS — BP 134/75 | HR 93 | Temp 97.40000°F | Resp 18 | Ht 62.0 in | Wt 150.6 lb

## 2023-07-17 DIAGNOSIS — M545 Low back pain, unspecified: Principal | ICD-10-CM

## 2023-07-17 MED ORDER — LIDOCAINE 5 % EX PTCH
5 | MEDICATED_PATCH | Freq: Every day | CUTANEOUS | 2 refills | Status: AC
Start: 2023-07-17 — End: 2023-08-16
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
## 2023-07-17 NOTE — Assessment & Plan Note (Signed)
 Continue Tylenol as needed for pain.  -refer to Home PT/OT.

## 2023-07-17 NOTE — Progress Notes (Signed)
 [facility]  [facility]  [phone]  [place], [place] [address]  [phone]    C/C: Acute/chronic medical problems    Subjective  Kimberly [name] is a 89 y.o. Black / [place] American female , established patient, her

## 2023-07-18 ENCOUNTER — Inpatient Hospital Stay: Admit: 2023-07-18 | Payer: MEDICARE | Attending: Surgery | Primary: Family Medicine

## 2023-07-18 ENCOUNTER — Encounter

## 2023-07-18 DIAGNOSIS — N63 Unspecified lump in unspecified breast: Secondary | ICD-10-CM

## 2023-07-18 DIAGNOSIS — C50412 Malignant neoplasm of upper-outer quadrant of left female breast: Secondary | ICD-10-CM

## 2023-07-18 NOTE — Progress Notes (Signed)
PA initiated for Lidoderm 5% patch

## 2023-07-22 ENCOUNTER — Encounter: Admit: 2023-07-22 | Admitting: Surgery

## 2023-07-22 DIAGNOSIS — R928 Other abnormal and inconclusive findings on diagnostic imaging of breast: Secondary | ICD-10-CM

## 2023-07-26 ENCOUNTER — Encounter: Admit: 2023-07-26 | Admitting: Neurology

## 2023-07-26 DIAGNOSIS — G609 Hereditary and idiopathic neuropathy, unspecified: Secondary | ICD-10-CM

## 2023-07-29 ENCOUNTER — Encounter: Admit: 2023-07-29 | Admitting: Family Medicine

## 2023-07-29 DIAGNOSIS — J301 Allergic rhinitis due to pollen: Secondary | ICD-10-CM

## 2023-07-29 MED ORDER — CETIRIZINE HCL 5 MG PO TABS
5 MG | ORAL_TABLET | Freq: Every day | ORAL | 1 refills | Status: DC
Start: 2023-07-29 — End: 2024-01-13

## 2023-07-29 NOTE — Telephone Encounter (Signed)
 Last appointment: 07/17/23  Next appointment: 09/17/23  Previous refill encounter(s): 02/11/23 #90 with 1 refill    Requested Prescriptions     Pending Prescriptions Disp Refills    cetirizine (ZYRTEC) 5 MG tablet [Pharmacy Med Name: CETIRIZINE 5MG  TABLETS] 9

## 2023-08-02 NOTE — Progress Notes (Signed)
 Faxed to: [facility] Order [phone]

## 2023-08-08 NOTE — Telephone Encounter (Signed)
 Noted.  Dr Maurine Minister notified

## 2023-08-08 NOTE — Telephone Encounter (Signed)
 Lauren from [employer] to report a missed visit with OT. No call back needed. FYI.

## 2023-08-21 ENCOUNTER — Encounter

## 2023-08-22 NOTE — Telephone Encounter (Signed)
Last appointment: 07/17/23  Next appointment: 09/17/23  Previous refill encounter(s): 05/21/23 #60    Requested Prescriptions     Pending Prescriptions Disp Refills    acetaminophen-codeine (TYLENOL #3) 300-30 MG per tablet [Pharmacy Med Name: ACETAMINOPHEN/COD #3 (300/30MG ) TAB] 60 tablet 0     Sig: Take 1 tablet by mouth every 12 hours as needed for Pain for up to 30 days. Max Daily Amount: 2 tablets         For Pharmacy Admin Tracking Only    Program: Medication Refill  CPA in place:    Recommendation Provided To:   Intervention Detail: New Rx: 1, reason: Patient Preference  Intervention Accepted By:   Eugenia Pancoast Closed?:    Time Spent (min): 5

## 2023-08-24 MED ORDER — ACETAMINOPHEN-CODEINE 300-30 MG PO TABS
300-30 | ORAL_TABLET | Freq: Two times a day (BID) | ORAL | 0 refills | Status: AC | PRN
Start: 2023-08-24 — End: 2023-09-23

## 2023-08-27 ENCOUNTER — Encounter

## 2023-08-28 MED ORDER — DULOXETINE HCL 60 MG PO CPEP
60 | ORAL_CAPSULE | ORAL | 1 refills | Status: DC
Start: 2023-08-28 — End: 2024-03-06

## 2023-08-29 ENCOUNTER — Inpatient Hospital Stay: Payer: MEDICARE | Attending: Surgery | Primary: Family Medicine

## 2023-09-03 ENCOUNTER — Ambulatory Visit: Admit: 2023-09-03 | Payer: MEDICARE | Attending: Neurology | Primary: Family Medicine

## 2023-09-03 VITALS — BP 120/66 | HR 92 | Temp 98.10000°F | Resp 17 | Ht 62.0 in | Wt 145.4 lb

## 2023-09-03 DIAGNOSIS — M79671 Pain in right foot: Secondary | ICD-10-CM

## 2023-09-03 DIAGNOSIS — I6523 Occlusion and stenosis of bilateral carotid arteries: Secondary | ICD-10-CM
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
## 2023-09-11 NOTE — Progress Notes (Signed)
Faxed to [employer] Order [phone]

## 2023-09-17 ENCOUNTER — Encounter

## 2023-09-17 ENCOUNTER — Telehealth
Admit: 2023-09-17 | Discharge: 2023-09-17 | Payer: BLUE CROSS/BLUE SHIELD | Attending: Family Medicine | Primary: Family Medicine

## 2023-09-17 DIAGNOSIS — M4727 Other spondylosis with radiculopathy, lumbosacral region: Secondary | ICD-10-CM

## 2023-09-17 MED ORDER — ROPINIROLE HCL 0.25 MG PO TABS
0.25 | ORAL_TABLET | Freq: Every evening | ORAL | 1 refills | Status: DC
Start: 2023-09-17 — End: 2024-06-17

## 2023-09-17 MED ORDER — PREGABALIN 200 MG PO CAPS
200 | ORAL_CAPSULE | Freq: Two times a day (BID) | ORAL | 5 refills | Status: DC
Start: 2023-09-17 — End: 2024-04-20

## 2023-09-17 MED ORDER — PREGABALIN 200 MG PO CAPS
200 | ORAL_CAPSULE | Freq: Two times a day (BID) | ORAL | 5 refills | Status: DC
Start: 2023-09-17 — End: 2023-09-17

## 2023-09-17 NOTE — Progress Notes (Signed)
Chief Complaint   Patient presents with    Back Pain     Follow-up/ Medication Refill      "Have you been to the ER, urgent care clinic since your last visit?  Hospitalized since your last visit?"    NO    "Have you seen or consulted any other health care providers outside our system since your last visit?"    NO

## 2023-09-17 NOTE — Progress Notes (Addendum)
[facility]  [facility]  [phone]. [address]  [place], [place] [address]  [phone]    Chief Complaint: Medication management    Subjective  Kimberly [name] is a 89 y.o. Black / [place] American female , established patient, here for evaluation of the concern(s) above;    SUBJECTIVE:     Pt would like to be evaluated for the problem(s) listed above:     PMHx: HTN, PVD, pituitary mirroadenoma S/P surgery (follows with Dr. Arville Lime), DM with peripheral neuropathy, DDD , CKD 3a, Malignant neoplasm of left breast, Dementia with behavioral disturbance (follows with neuro), and HLD who presents for follow up of chronic problems;     Present at visit: Rene Kocher (Niece- primary caregiver) and Ellison Hughs (her other niece).    Family plan on taking patient to [place] to visit her brother and upon return, they will get her into hospice. They already contacted hospice and was asked to call back once they return.       Neuropathy/dementia:  On Lyrica 200mg  BID for neuropathy managed by neurology.  Completed home PT.  Uses a Rolator walker when needed.  Recently saw neuro (Dr. Katrinka Blazing) for evaluation.    Restless leg - on Ropinirole.     Pt denies any  fever, chill, chest pain, SOB, abdominal pain, n/v/d. Pt reports doing well overall.     Other Health Habits and social history:  Her only daughter passed away.     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     Review of systems:       A comprehensive review of systems was negative except for that written in the History of Present Illness.         PHYSICAL EXAM:     General: alert, cooperative, no distress   Mental  status: mental status: alert, oriented to person, place, and time, normal mood, behavior, speech, dress, motor activity, and thought processes   Resp: resp: normal effort and no respiratory distress   Neuro: neuro: no gross deficits    Skin: skin: no discoloration or lesions of concern on visible areas   Due to this being a TeleHealth evaluation, many elements of the physical examination are unable to be assessed.        ASSESSMENT/PLAN:     1. Lumbosacral radiculopathy due to degenerative joint disease of spine  Assessment & Plan:   Chronic, at goal (stable), continue current treatment plan, medication adherence emphasized, and lifestyle modifications recommended  Orders:  -     pregabalin (LYRICA) 200 MG capsule; Take 1 capsule by mouth 2 times daily for 180 days. Max Daily Amount: 400 mg, Disp-60 capsule, R-5Normal  2. Cervical radiculopathy due to degenerative joint disease of spine  -     pregabalin (LYRICA) 200 MG capsule; Take 1 capsule by mouth 2 times daily for 180 days. Max Daily Amount: 400 mg, Disp-60 capsule, R-5Normal  3. Diabetic peripheral neuropathy associated with type 2 diabetes mellitus (HCC)  -     pregabalin (LYRICA) 200 MG capsule; Take 1 capsule by mouth 2 times daily for 180 days. Max Daily Amount: 400 mg, Disp-60 capsule, R-5Normal  4. Restless leg  -     rOPINIRole (REQUIP) 0.25 MG tablet; Take 1 tablet by mouth nightly, Disp-90 tablet, R-1Normal      FOLLOW UP: 3 months or sooner if needed  On  this date 09/17/23 I have spent 25 minutes reviewing previous notes, test results and cirtual with the patient discussing the diagnosis and importance of compliance with the treatment plan as well as documenting on the day of the visit.        We discussed the expected course, resolution and complications of the diagnosis(es) in detail. Patient was in [place] at the time of consultation. Medication risks, benefits, costs, interactions, and alternatives were discussed as indicated.  I advised her to contact the office if her condition worsens, changes or fails to improve as anticipated. She expressed understanding with the diagnosis(es) and plan. Patient understands that this encounter was a temporary measure, and the importance  of further follow up and examination was emphasized.  Patient verbalized understanding.           Carolyn Stare is being evaluated by a Virtual Visit (video visit) encounter to address concerns as mentioned above.  A caregiver was present when appropriate. Due to this being a Scientist, research (medical) (During COVID-19 public health emergency), evaluation of the following organ systems was limited: Vitals/Constitutional/EENT/Resp/CV/GI/GU/MS/Neuro/Skin/Heme-Lymph-Imm.  Pursuant to the emergency declaration under the [facility] Act and the [employer], 1135 waiver authority and the [employer] and [employer] Act, this Virtual Visit was conducted with patient's (and/or legal guardian's) consent, to reduce the patient's risk of exposure to COVID-19 and provide necessary medical care.  The patient (and/or legal guardian) has also been advised to contact this office for worsening conditions or problems, and seek emergency medical treatment and/or call 911 if deemed necessary.     Patient identification was verified at the start of the visit: YES     Services were provided through a video synchronous discussion virtually to substitute for in-person clinic visit. Patient was located at home and provider was located in office or at home.      An electronic signature was used to authenticate this note.     -- Rene Paci, MD      CPT Codes [phone] for Established Patients may apply to this Telehealth Visit.  POS code: 58.  Modifier GT

## 2023-09-17 NOTE — Assessment & Plan Note (Signed)
Chronic, at goal (stable), continue current treatment plan, medication adherence emphasized, and lifestyle modifications recommended

## 2023-09-17 NOTE — Telephone Encounter (Signed)
 Duplicate    For Pharmacy Admin Tracking Only    Program: Medication Refill  CPA in place:    Recommendation Provided To:   Intervention Detail: Discontinued Rx: 1, reason: Duplicate Therapy  Intervention Accepted By:   Eugenia Pancoast Closed?:    Time Spent (min): 5

## 2023-09-18 ENCOUNTER — Encounter

## 2023-09-19 NOTE — Care Coordination-Inpatient (Signed)
Ambulatory Care Coordination Note     09/19/2023 10:39 AM     Patient Current Location:  [place]     This patient was received as a referral from the patient (self-referred).    Family, daughter,  contacted the ACM by telephone. Verified name and DOB with caregiver as identifiers. Provided introduction to self, and explanation of the ACM role.   Caregiver accepted care management services at this time.          ACM: Collier Flowers, RN     Challenges to be reviewed by the provider   Additional needs identified to be addressed with provider Yes  Patient care: Rene Kocher, daughter, called ACM to contact PCP for concerns:  If okay to get Covid #4 and RSV shots before trip on Saturday  Brother wants to take patient on 5-day cruise-concern about being around others on cruise ship-any special precautions for her age?  Want to get early refill on Lyrica so she do not run out. Pharmacy will not do early unless hear from office. She will be away when it is due.  Leaves on Saturday, please call Rene Kocher.               Method of communication with provider: chart routing.    Utilization: Initial Call - N/A    Care Summary Note: Call from daughter with concerns about getting shots and an early refill on medication, due to patient going to [place], then on a 5-day cruise. Message sent to PCP to contact caregiver.    Offered patient enrollment in the Remote Patient Monitoring (RPM) program for in-home monitoring: Yes, but did not enroll at this time: already monitoring with home equipment.     Assessments Completed:       09/19/2023    10:33 AM   Amb Fall Risk Assessment and TUG Test   Do you feel unsteady or are you worried about falling?  yes   2 or more falls in past year? no   Fall with injury in past year? no    ,  ,   Hypertension - Encounter Level    Symptom course: stable      ,   General Assessment    Do you have any symptoms that are causing concern?: No          Medications Reviewed:   Patient denies any changes with  medications and reports taking all medications as prescribed.    Advance Care Planning:   Reviewed and current     Care Planning:   Education Documentation  Educate home safety, taught by Collier Flowers, RN at 09/19/2023 10:38 AM.  Learner: Family  Readiness: Acceptance  Method: Explanation  Response: Trenton Gammon Understanding    COGNITIVE : FALLS-RISK OF, taught by Collier Flowers, RN at 09/19/2023 10:38 AM.  Learner: Family  Readiness: Acceptance  Method: Explanation  Response: Verbalizes Understanding    COGNITIVE : FALLS-RISK OF, taught by Collier Flowers, RN at 09/19/2023 10:38 AM.  Learner: Family  Readiness: Acceptance  Method: Explanation  Response: Verbalizes Understanding    Educate safety precautions, taught by Collier Flowers, RN at 09/19/2023 10:38 AM.  Learner: Family  Readiness: Acceptance  Method: Explanation  Response: Verbalizes Understanding    Education Comments  No comments found.     ,    Goals Addressed                   This Visit's Progress  Conditions and Symptoms        I will schedule office visits, as directed by my provider.  I will keep my appointment or reschedule if I have to cancel.  I will notify my provider of any barriers to my plan of care.  I will follow my Zone Management tool to seek urgent or emergent care.    Barriers: none and impairment:  physical: Weakness  Plan for overcoming my barriers: compliance  Confidence: 9/10  Anticipated Goal Completion Date: 12/17/23                 PCP/Specialist follow up:   Future Appointments         Provider Specialty Dept Phone    10/11/2023 10:30 AM [facility] 1 Radiology [phone]    11/27/2023 1:40 PM Sabino Snipes, MD General Surgery [phone]    03/30/2024 1:40 PM Youlanda Roys, MD Neurology [phone]            Follow Up:   Plan for next ACM outreach in approximately 2 weeks to complete:  - disease specific assessments  - medication review   - goal progression  - education .   Caregiver is agreeable to this  plan.

## 2023-09-19 NOTE — Telephone Encounter (Signed)
 Last office visit: 09/03/2023  Next office visit: 03/30/2024  Last med refill: 08/21/2022

## 2023-09-20 MED ORDER — DONEPEZIL HCL 5 MG PO TABS
5 | ORAL_TABLET | ORAL | 11 refills | Status: DC
Start: 2023-09-20 — End: 2024-03-30

## 2023-09-20 NOTE — Telephone Encounter (Signed)
Patient's daughter Rene Kocher [facility])  wants a call back  about getting Lyrica early fill, going out of town Saturday, and wants to know if it is Ok if patient gets a COVID, and RSV Vaccine. Rene Kocher can be reached at [phone].

## 2023-09-23 NOTE — Telephone Encounter (Signed)
Lyrica sent to pharmacy on 09/20/2023. Patient can get both vaccines at pharmacy if needed for going out of town. Can return call to office with any questions.

## 2023-09-26 ENCOUNTER — Encounter: Payer: BLUE CROSS/BLUE SHIELD | Attending: Neurology | Primary: Family Medicine

## 2023-09-28 NOTE — ED Provider Notes (Signed)
 Formatting of this note is different from the original.  Date: September 28, 2023    I, Kimberly [name] (scribe), transcribed this document as dictated for Kimberly Gentile, DO. Patient was seen with resident physician, Dr. Cephas Darby.    History     Chief Complaint:    Chief Complaint   Patient presents with    Fall     Mechanical fall last night at 2100    Flank Pain     R side. S/P mechanical fall. 7/10     HPI:    89 y.o. female with a pertinent history of HTN, dementia, and neuropathy, not on anticoagulation, BIB EMS to the ED s/p unwitnessed mechanical slip and fall yesterday evening.     Patient presents with her niece. Patient's niece states patient ambulates with a walker at baseline.     Per niece, patient was in the restroom at approximately 2100 yesterday when she slipped while reaching for her walker, falling onto her right side. Patient also confirms story and states she slipped and fell. Patient reports she fell onto her bilateral knees. Denies head strike or LOC. Patient's niece states patient has been complaining of right sided chest wall pain and bilateral knee pain. Denies chest pain or shortness of breath.    Limitations to History:   Dementia.    Outside historian(s):   Family who states : HPI as above.    External records reviewed:   I reviewed recent and relevant records including: None.     Past Medical History     Past Medical History  Past Medical History:   Diagnosis Date    Hypertension     Neuropathy     Presenile dementia, uncomplicated (HCC-CMS)        Past Surgical History   History reviewed. No pertinent surgical history.     Social History:   Social History     Occupational History    Not on file   Tobacco Use    Smoking status: Never    Smokeless tobacco: Never   Vaping Use    Vaping status: Never Used   Substance and Sexual Activity    Alcohol use: Yes     Comment: socially    Drug use: Never    Sexual activity: Not Currently       Allergies:  Allergies   Allergen Reactions    Sulfa  (Sulfonamide Antibiotics) Unknown     Current Medications:  No current outpatient medications   Per nurses notes and medications reviewed.    Immunizations:    There is no immunization history on file for this patient.    Physical Exam   BP (!) 152/78 (BP Location: Left Upper Arm, BP Patient Position: Supine)   Pulse 77   Temp 97.8 F (36.6 C) (Oral)   Resp 18   SpO2 94%     Physical Exam  Vitals and nursing note reviewed.   Constitutional:       General: She is not in acute distress.     Appearance: She is well-developed. She is not diaphoretic.   HENT:      Head: Normocephalic and atraumatic.   Eyes:      Extraocular Movements: Extraocular movements intact.   Cardiovascular:      Rate and Rhythm: Normal rate and regular rhythm.   Pulmonary:      Effort: Pulmonary effort is normal. No respiratory distress.      Breath sounds: Normal breath sounds.   Abdominal:  General: Bowel sounds are normal. There is no distension.      Palpations: Abdomen is soft.      Tenderness: There is no abdominal tenderness. There is no guarding.   Musculoskeletal:         General: Tenderness present. Normal range of motion.      Cervical back: Normal range of motion.      Comments: No midline C/T/L spine tenderness to palpation.  Tenderness to palpation of the bilateral knees, no bony deformities. ROM intact bilaterally.   Skin:     General: Skin is warm.   Neurological:      General: No focal deficit present.      Mental Status: She is alert and oriented to person, place, and time.      GCS: GCS eye subscore is 4. GCS verbal subscore is 5. GCS motor subscore is 6.     ED Course     Orders:   Orders Placed This Encounter   Procedures    CT Head WO Contrast    XR Knee Complete 4V Lt    XR Chest Single View    XR Knee Complete 4V Rt    CBC    Differential    Comprehensive Metabolic Panel    Respiratory Panel, Nucleic Acid    Troponin T, High Sensitivity STAT Series    Urinalysis With Reflex to Culture    ECG 12 Lead    ECG 12 Lead      Procedures    Medications and/or Fluids Given in ED:   Medications   acetaminophen (TYLENOL) tablet 650 mg (650 mg Oral Given 09/28/23 1642)   lactated ringers (LR) BOLUS 500 mL (0 mLs Intravenous Stopped 09/28/23 1842)     Labs:  Labs Reviewed   CBC WITH DIFFERENTIAL - LAB - Abnormal; Notable for the following components:       Result Value    WBC 4.67 (*)     MCV 69.2 (*)     MCH 21.8 (*)     RDW 15.9 (*)     All other components within normal limits   DIFFERENTIAL - Abnormal; Notable for the following components:    Mono % 13.1 (*)     Baso % 1.5 (*)     All other components within normal limits   COMPREHENSIVE METABOLIC PANEL   RESPIRATORY VIRAL PANEL, NUCLEIC ACID   URINALYSIS WITH REFLEX TO CULTURE   TROPONIN T, HIGH SENSITIVITY     Radiology interpretation by Radiology Physician and/or Bedside Ultrasound interpretation(s) by ED attending physician:   CT Head WO Contrast    Result Date: 09/29/2023  EXAMINATION: CT HEAD WO CONTRAST ([id number]) 09/28/2023 9:18 PM HISTORY: 89 years old Female with Fall. COMPARISON: None PROCEDURE: Utilizing the Siemens CT scanner, 5-mm contiguous scans of the head were obtained from the vertex to the skull base. Image data were processed using Standard and Boneplus algorithms. Sagittal and coronal reconstructions were provided. FINDINGS: There is mild to moderate cerebral sulcal and ventricular prominence. Mild scattered periventricular and subcortical white matter hypodensities are noted. There is an enlarged pituitary gland measuring up to 1.6 cm which appears to abut the optic chiasm. The brain otherwise shows normal morphology and gray-white matter differentiation, without intracranial hemorrhage, extra-axial fluid collection, mass effect or acute large vessel infarct. There is atherosclerotic calcification of the bilateral parasellar internal carotid arteries. The basal cisterns are patent. The skull and visible facial bones are intact. There is complete opacification of the  axillary sinus. The mastoid air cells and middle ear cavities are well-aerated. The soft tissues of the scalp are unremarkable. IMPRESSION: ATTN: Resident Preliminary Report Not Yet Reviewed by An Attending. Dictated by: Margarita Mail on 09/28/2023 11:44 PM No acute intracranial abnormality. 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 An outpatient pituitary protocol MRI can be obtained for further evaluation as clinically indicated. Moderate cerebral atrophy with mild nonspecific white matter changes. Complete opacification of the left maxillary sinus may represent inflammatory change with infundibular pattern of obstruction. CT DOSE: The maximum CTDIvol = 49.6 mGy. The total DLP = 928 mGy*cm.     XR Knee Complete 4V Lt    Result Date: 09/28/2023  XR KNEE COMPLETE 4V LT [phone]): 09/28/2023 7:03 PM  HISTORY: 89 year old female status post fall. TECHNICAL DATA: 4 views were obtained of the left knee. COMPARISON: Concurrent right knee radiographs FINDINGS: Regional bones are intact without acute fracture seen. There is severe tricompartmental degenerative changes of the left knee with large marginal osteophytes, joint space narrowing, and subchondral sclerotic and cystic changes most prominent in the medial tibiofemoral compartment. There are large patellar and quadriceps tendon enthesophytes. Minimal regional soft tissue swelling is present. There may be a trace joint effusion. IMPRESSION: No acute osseous abnormality of the left knee. Severe tricompartmental degenerative changes of the left knee, most severe in the medial tibiofemoral compartment. Possible trace knee joint effusion. DICTATED: Pearletha Alfred I Reviewed Images Personally and Agree With Interpretation, as currently edited. Electronically SIGNED / Radiologist Percell Belt on 09/28/2023 7:24 PM     XR Knee Complete  4V Rt    Result Date: 09/28/2023  XR KNEE COMPLETE 4V RT [phone]): 09/28/2023 7:03 PM  HISTORY: 89 year old female status post fall. TECHNICAL DATA: 4 views were obtained of the right knee. COMPARISON: Concurrent left knee radiographs. FINDINGS: Regional bones are intact without acute fracture seen. There is severe tricompartmental degenerative changes of the right knee with large marginal osteophytes, joint space narrowing, and subchondral sclerotic and cystic changes most prominent in the lateral tibiofemoral compartment. Mild regional soft tissue swelling is present. There is a small knee joint effusion. IMPRESSION: No acute osseous abnormality of the right knee. Severe tricompartmental degenerative changes of the right knee, most severe in the lateral tibiofemoral compartment. Small knee joint effusion. DICTATED: Pearletha Alfred I Reviewed Images Personally and Agree With Interpretation, as currently edited. Electronically SIGNED / Radiologist Percell Belt on 09/28/2023 7:22 PM     XR Chest Single View    Result Date: 09/28/2023  XR CHEST SINGLE VIEW [id number]): 09/28/2023 7:03 PM HISTORY: 89 year old female status post fall with right-sided chest wall pain. COMPARISON: None FINDINGS: Portable semiupright view of the chest has been obtained. The cardiac silhouette is borderline enlarged. Atherosclerotic calcification is present in the aorta. There is diffuse coarseness of the bronchovascular markings. Left perihilar and bilateral lower lung zone airspace disease is present. There is no obvious pleural effusion or pneumothorax. Regional bones are osteopenic, making evaluation for the presence of subtle rib fractures difficult. There may be an acute fracture of the right third posterolateral rib. Degenerative changes are present in both glenohumeral joints. There is apparent decrease in height of T8 and possibly T10. Soft tissues are unremarkable. IMPRESSION: Borderline cardiomegaly. Probable senescent lung changes and  less likely acute parenchymal pathology. Cannot exclude acute fracture of the right third posterolateral rib. Possible compression fractures  of T8 and T10, chronicity indeterminate. Clinical correlation regarding acute pain recommended. Severe degenerative changes of both glenohumeral joints. DICTATED: Percell Belt I Reviewed Images Personally and Agree With Interpretation, as currently edited. Electronically SIGNED / Radiologist Percell Belt on 09/28/2023 7:17 PM       I independently interpreted the following study(s) (if applicable):  EKG. Please see the interpretation below.     [facility] ED EKG:   Date: September 28, 2023  Time: 18:09:42      Heart rate: 69 bpm  QT/QTc: 420/450 ms  My Independent  Interpretation:  Normal sinus rhythm  Anterior myocardial infarction, age undetermined  T wave abnormality, consider lateral ischemia  Abnormal ECG    EKG initially interpreted as no STEMI by ED provider at 18:13.      Consults:  None    Update(s):   ED Course as of 09/29/23 0101   Sun Sep 29, 2023   0012 CT Head WO Contrast  No acute intracranial abnormality.    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 An outpatient pituitary protocol MRI can be obtained for further evaluation as clinically indicated.    Moderate cerebral atrophy with mild nonspecific white matter changes.    Complete opacification of the left maxillary sinus may represent inflammatory change with infundibular pattern of obstruction.   [KF]     ED Course User Index  [KF] Cephas Darby, MD     Medical Decision Making     Medical Decision Making Discussion      I have reviewed all the available lab data, EKGs, rhythm strips and radiologic images from this visit to the Emergency Department. When possible I compared this information data set with accessible data from prior visits. The remaining comments will be  highlights of this review.     89 y.o. female  with a pertinent history of HTN, dementia, and neuropathy, not on anticoagulation, BIB EMS to the ED s/p unwitnessed mechanical slip and fall yesterday evening. Patient with R sided chest wall pain. Initial vitals stable. On exam, notable for bilateral knee tenderness to palpation with normal ROM. No other traumatic findings noted, patient is GCS 15. Differentials include scalp contusion, knee sprain, UTI, less likely ACS or ICH/fracture. XR of knees unremarkable. CTH show no acute findings, did show incidental possible pituitary lesion. Labs unremarkable. UA negative for infection. On reassessment, patient remains GCS 15 and without distress. Suspect transient AMS as reported by niece due to patient's progression of dementia vs lack of sleep. At this time, pt appears at her baseline as per niece.  However, patient is currently no longer altered or in distress. Signed out to oncoming provider, Dr. Greig Castilla, pending troponin and likely discharge if not significantly elevated.    Social determinants that significantly affected care:  None    Discussion of management with other physicians or appropriate source:   None.    Escalation of care including admission/OBS considered:   None.    Diagnostic test(s) and clinical decision rule(s) considered:   As ordered above, Labs, and Imaging.     Prescription drugs considered:  None.    Diagnosis:   1. Fall, initial encounter    2. Pituitary macroadenoma (HCC-CMS)    3. Sinusitis, unspecified chronicity, unspecified location      Disposition: Signed out to oncoming attending, Dr. Greig Castilla     Condition: stable  Counseled: Patient or relevant medical proxy, regarding diagnosis, regarding diagnostic results, regarding treatment plan, and patient or medical proxy indicated understanding.     Kimberly [name], Medical Scribe  09/29/23  1:05 AM    By signing below, I acknowledge that I have dictated this note to the above named scribe, and I  have reviewed the above note for accuracy and edited where necessary. The note accurately reflects the services provided by me at this encounter. I personally made/approved the management plan and take responsibility for the patient management. Attending attestation: I performed a history and physical examination of the patient and discussed their management with the resident physician. I reviewed and edited the resident physician's note and agree with the documented findings and plan of care.      Kimberly Gentile, DO  09/29/23 1324    Electronically signed by Kimberly Gentile, DO at 09/29/2023  1:46 AM PST

## 2023-09-28 NOTE — ED Notes (Signed)
 Formatting of this note might be different from the original.  MD at bedside.  Electronically signed by Dennison Mascot, RN at 09/28/2023  5:45 PM PST

## 2023-09-29 NOTE — Unmapped (Signed)
 Formatting of this note might be different from the original.     [id number] en   After a Fall   You have had a fall today. That means that you slipped, tripped, or lost your balance. If your fall was because of fainting or a seizure, you might need other tests.   It is normal to feel sore and tight in your muscles and back the next day, and not just the muscles you injured. Remember, all the parts of your body are connected, so while one area hurts now, the next day another may hurt. Also, when you injure yourself, it causes inflammation. This then causes the muscles to tighten up and hurt more. After the initial worsening symptoms, they should slowly improve over the next few days. Tell your healthcare provider if you have more severe pain.   Even without a definite head injury, you can still get a concussion from your head suddenly jerking forward, backward, or sideways when you fall. This is especially true if you have had concussions in the past. Concussions and even bleeding can still happen, especially if you had a recent injury or take blood thinner medicine. It is not unusual to have a mild headache and feel tired and even nauseous or dizzy.    Home care     Rest today and return to your normal activities when you are feeling back to normal.     If you were injured during the fall, follow the advice from your healthcare provider about how to care for your injury.     At first, don't try to stretch out the sore spots. If there is a strain, stretching may make it worse. Massage may help relax the muscles without stretching them.     Use an ice pack or cold compress on and off at the sore spots for 10 to 20 minutes at a time, as often as you feel comfortable. This may help reduce the inflammation, swelling, and pain.     Know that if you have any scrapes (abrasions), they often heal within 10 days. Keep the scrapes clean while they start to heal. But an infection may happen even with correct care. So watch for  early signs of infection (such as warmth, redness, or swelling).     Medicines     Talk with your healthcare provider before taking new medicines, especially if you have other health problems or are taking other medicines.     If you need anything for pain, use acetaminophen or ibuprofen, unless you were given a different pain medicine to use. Talk with your healthcare provider before using these medicines if you:   o  Have chronic liver or kidney disease   o  Ever had a stomach ulcer or gastrointestinal bleeding   o  Are taking blood-thinner medicines     Be careful if you are given prescription pain medicines, narcotics, or medicine for muscle spasms. They can make you sleepy and dizzy. And they can affect your coordination, reflexes, and judgment. Don't drive or do work where you can hurt yourself when taking them.     Fall prevention     Fix, remove, or replace anything that caused your fall.     Make your home safe by keeping walkways clear of objects you could trip over.     Use nonslip pads under rugs. Don't use small area rugs or throw rugs.     Don't walk in poorly lit areas.  Don't stand on chairs or wobbly ladders.     Be careful when reaching overhead or looking upward. This position can cause a loss of balance.     Be sure your shoes fit correctly, have nonslip bottoms, and are in good condition.     Be careful when going up and down curbs, and walking on uneven sidewalks.     If your balance is poor, think about using a cane or walker.     Stay as active as you can. Balance, flexibility, strength, and endurance all come from exercise. They all play a role in preventing falls.     If you have pets, know where they are before you stand up or walk so you don't trip over them.     Limit alcohol intake. Alcohol can cause balance problems and increase the risk for falls.     Use night-lights.     Have your eyes tested to be sure you are seeing well, even if you already wear glasses.       Follow-up   Follow up with your healthcare provider, or as advised. If X-rays or CT scans were done, you will be told if there is a change in the reading, especially if it affects treatment.     Call 911    Call 911 if any of these happen:     Trouble breathing     Confusion     Trouble waking up     Fainting or loss of consciousness     Fast or very slow heart rate     Seizure     Trouble with speech or vision, weakness of an arm or leg     Trouble walking or talking, loss of balance, numbness or weakness on one side of your body, or facial droop     When to get medical advice   Call your healthcare provider right away if any of these happen:     Repeated falls, including falls that seem to happen for no reason     Dizziness     Severe headache     Blood in vomit or stools (look black or red in color)     Last Reviewed Date: 08/06/2021 00:00:00    2000-2025 [employer]. All rights reserved. This information is not intended as a substitute for professional medical care. Always follow your healthcare professional's instructions.       Electronically signed by Council Mechanic, MD at 09/29/2023  1:28 AM PST

## 2023-09-29 NOTE — ED Notes (Signed)
 Formatting of this note might be different from the original.  Assumed care of patient. GCS at 14. C/O bilateral knee pain, FLACC at 3. Denies flank pain. On call for CT Scan. Family at bedside.   Electronically signed by Marlowe Alt, RN at 09/28/2023 10:01 PM PST

## 2023-09-29 NOTE — ED Notes (Signed)
 Formatting of this note might be different from the original.  Resting quietly in bed, no distress noted. Awaiting CT Scan results.   Electronically signed by Marlowe Alt, RN at 09/28/2023 10:02 PM PST

## 2023-09-29 NOTE — Unmapped (Signed)
 Formatting of this note is different from the original.  Images from the original note were not included.     [id number] en   Sinusitis (No Antibiotics)     The sinuses are air-filled spaces in the bones of the face. They connect to the inside of the nose. Sinusitis is redness and swelling (inflammation) of the tissue that lines the sinuses. It can occur during a cold. It can also be due to allergies to pollens and other particles in the air. It can cause symptoms, such as sinus congestion, headache, sore throat, face swelling, and a feeling of fullness. It may also cause a low-grade fever. This type of sinusitis is not caused by an infection with bacteria. So antibiotics aren't used to treat it.   Home care     Drink plenty of water, hot tea, and other liquids, as directed by the healthcare provider. This may help thin nasal mucus. It also may help your sinuses drain fluids.     Heat may help soothe painful parts of your face. Use a towel soaked in hot water. Or stand in the shower and direct the warm spray on your face. Using a vaporizer along with a menthol rub at night may also help soothe symptoms.      An expectorant with guaifenesin may help thin nasal mucus and help your sinuses drain fluids. If you have any questions about an over-the-counter medicine or its side effects, talk with your healthcare provider or pharmacist before taking it.     You can use an over-the-counter decongestant, unless a similar medicine was prescribed to you. Nasal sprays work the fastest. Use one that has phenylephrine or oxymetazoline. But only use it for a total of 3 days. First blow your nose gently. Then use the spray. Don't use these medicines more often than directed on the label. If you do, your symptoms may get worse. You may also take pills that contain pseudoephedrine for up to 7 days. Don?t use products that combine multiple medicines. This may increase side effects. Read all medicine labels. You can also ask the  pharmacist for help. (People with high blood pressure should not use decongestants. They can raise blood pressure.)     Over-the-counter antihistamines or steroid nasal sprays, if advised by the healthcare provider, may help if allergies were the cause of your sinusitis.     Use acetaminophen or ibuprofen to control pain, unless another pain medicine was prescribed to you. If you have long-term (chronic) liver or kidney disease, or ever had a stomach ulcer, talk with your provider before using these medicines. Aspirin should never be taken by anyone under age 32 unless directed by the provider. It can lead to a risk for Reye syndrome. This is a rare but very serious disorder that most often affects the brain and the liver.     Use nasal rinses or irrigation with saltwater as directed by your provider.     Don't smoke. This can make symptoms worse.     Follow-up care   Follow up with your healthcare provider if you are not better in 1 week.     When to get medical care   Call your healthcare provider if any of these occur:     Green or yellow fluid draining from your nose or into your throat     Facial pain or headache that gets worse     Stiff neck     Swelling of your forehead or eyelids  Vision problems, such as blurred or double vision     Fever of 100.40F (38C) or higher, or as advised by your provider     Symptoms that don't go away in 10 days or get worse after starting to get better     Call 911   Call 911 if any of the following occur:     Seizure     Trouble breathing     Abnormal drowsiness or confusion     Feeling dizzy or faint     Fingernails, skin, or lips look blue, purple, or gray     Last Reviewed Date: 07/06/2021 00:00:00    2000-2025 [employer], [employer]. All rights reserved. This information is not intended as a substitute for professional medical care. Always follow your healthcare professional's instructions.       Electronically signed by Council Mechanic, MD at  09/29/2023  1:28 AM PST

## 2023-10-02 NOTE — Care Coordination-Inpatient (Signed)
 09/20/23   Niece called to see if ACM had gotten response from PCP on concerns about her aunt traveling over the weekend. ACM explained that provider has responded, okay to have shots and to travel. Order called in for Lyrica. Niece called ACM to say pharmacy still would not fill until they receive call from office. ACM called the pharmacy and order was confirmed.

## 2023-10-10 NOTE — Telephone Encounter (Signed)
 [facility] would like a call back at [phone] to get an order for Hospice per family request.

## 2023-10-11 ENCOUNTER — Inpatient Hospital Stay: Admit: 2023-10-11 | Payer: MEDICARE | Attending: Neurology | Primary: Family Medicine

## 2023-10-11 DIAGNOSIS — S06330A Contusion and laceration of cerebrum, unspecified, without loss of consciousness, initial encounter: Secondary | ICD-10-CM

## 2023-10-11 MED ORDER — GADOTERIDOL 279.3 MG/ML IV SOLN
279.3 | Freq: Once | INTRAVENOUS | Status: AC | PRN
Start: 2023-10-11 — End: 2023-10-11
  Administered 2023-10-11: 18:00:00 13 mL via INTRAVENOUS

## 2023-10-11 NOTE — ED Provider Notes (Signed)
 Formatting of this note is different from the original.  Date: September 28, 2023    I, [name] (scribe), transcribed this document as dictated for Francee Gentile, DO. Patient was seen with resident physician, Dr. Cephas Darby.    History     Chief Complaint:    Chief Complaint   Patient presents with    Fall     Mechanical fall last night at 2100    Flank Pain     R side. S/P mechanical fall. 7/10     HPI:    89 y.o. female with a pertinent history of HTN, dementia, and neuropathy, not on anticoagulation, BIB EMS to the ED s/p unwitnessed mechanical slip and fall yesterday evening.     Patient presents with her niece. Patient's niece states patient ambulates with a walker at baseline.     Per niece, patient was in the restroom at approximately 2100 yesterday when she slipped while reaching for her walker, falling onto her right side. Patient also confirms story and states she slipped and fell. Patient reports she fell onto her bilateral knees. Denies head strike or LOC. Patient's niece states patient has been complaining of right sided chest wall pain and bilateral knee pain. Denies chest pain or shortness of breath.    Limitations to History:   Dementia.    Outside historian(s):   Family who states : HPI as above.    External records reviewed:   I reviewed recent and relevant records including: None.     Past Medical History     Past Medical History  Past Medical History:   Diagnosis Date    Hypertension     Neuropathy     Presenile dementia, uncomplicated (HCC-CMS)        Past Surgical History   History reviewed. No pertinent surgical history.     Social History:   Social History     Occupational History    Not on file   Tobacco Use    Smoking status: Never    Smokeless tobacco: Never   Vaping Use    Vaping status: Never Used   Substance and Sexual Activity    Alcohol use: Yes     Comment: socially    Drug use: Never    Sexual activity: Not Currently       Allergies:  Allergies   Allergen Reactions    Sulfa  (Sulfonamide Antibiotics) Unknown     Current Medications:  No current outpatient medications   Per nurses notes and medications reviewed.    Immunizations:    There is no immunization history on file for this patient.    Physical Exam   BP (!) 152/78 (BP Location: Left Upper Arm, BP Patient Position: Supine)   Pulse 77   Temp 97.8 F (36.6 C) (Oral)   Resp 18   SpO2 94%     Physical Exam  Vitals and nursing note reviewed.   Constitutional:       General: She is not in acute distress.     Appearance: She is well-developed. She is not diaphoretic.   HENT:      Head: Normocephalic and atraumatic.   Eyes:      Extraocular Movements: Extraocular movements intact.   Cardiovascular:      Rate and Rhythm: Normal rate and regular rhythm.   Pulmonary:      Effort: Pulmonary effort is normal. No respiratory distress.      Breath sounds: Normal breath sounds.   Abdominal:  General: Bowel sounds are normal. There is no distension.      Palpations: Abdomen is soft.      Tenderness: There is no abdominal tenderness. There is no guarding.   Musculoskeletal:         General: Tenderness present. Normal range of motion.      Cervical back: Normal range of motion.      Comments: No midline C/T/L spine tenderness to palpation.  Tenderness to palpation of the bilateral knees, no bony deformities. ROM intact bilaterally.   Skin:     General: Skin is warm.   Neurological:      General: No focal deficit present.      Mental Status: She is alert and oriented to person, place, and time.      GCS: GCS eye subscore is 4. GCS verbal subscore is 5. GCS motor subscore is 6.     ED Course     Orders:   Orders Placed This Encounter   Procedures    CT Head WO Contrast    XR Knee Complete 4V Lt    XR Chest Single View    XR Knee Complete 4V Rt    CBC    Differential    Comprehensive Metabolic Panel    Respiratory Panel, Nucleic Acid    Troponin T, High Sensitivity STAT Series    Urinalysis With Reflex to Culture    ECG 12 Lead    ECG 12 Lead      Procedures    Medications and/or Fluids Given in ED:   Medications   acetaminophen (TYLENOL) tablet 650 mg (650 mg Oral Given 09/28/23 1642)   lactated ringers (LR) BOLUS 500 mL (0 mLs Intravenous Stopped 09/28/23 1842)     Labs:  Labs Reviewed   CBC WITH DIFFERENTIAL - LAB - Abnormal; Notable for the following components:       Result Value    WBC 4.67 (*)     MCV 69.2 (*)     MCH 21.8 (*)     RDW 15.9 (*)     All other components within normal limits   DIFFERENTIAL - Abnormal; Notable for the following components:    Mono % 13.1 (*)     Baso % 1.5 (*)     All other components within normal limits   COMPREHENSIVE METABOLIC PANEL   RESPIRATORY VIRAL PANEL, NUCLEIC ACID   URINALYSIS WITH REFLEX TO CULTURE   TROPONIN T, HIGH SENSITIVITY     Radiology interpretation by Radiology Physician and/or Bedside Ultrasound interpretation(s) by ED attending physician:   CT Head WO Contrast    Result Date: 09/29/2023  EXAMINATION: CT HEAD WO CONTRAST ([id number]) 09/28/2023 9:18 PM HISTORY: 89 years old Female with Fall. COMPARISON: None PROCEDURE: Utilizing the Siemens CT scanner, 5-mm contiguous scans of the head were obtained from the vertex to the skull base. Image data were processed using Standard and Boneplus algorithms. Sagittal and coronal reconstructions were provided. FINDINGS: There is mild to moderate cerebral sulcal and ventricular prominence. Mild scattered periventricular and subcortical white matter hypodensities are noted. There is an enlarged pituitary gland measuring up to 1.6 cm which appears to abut the optic chiasm. The brain otherwise shows normal morphology and gray-white matter differentiation, without intracranial hemorrhage, extra-axial fluid collection, mass effect or acute large vessel infarct. There is atherosclerotic calcification of the bilateral parasellar internal carotid arteries. The basal cisterns are patent. The skull and visible facial bones are intact. There is complete opacification of the  axillary sinus. The mastoid air cells and middle ear cavities are well-aerated. The soft tissues of the scalp are unremarkable. IMPRESSION: ATTN: Resident Preliminary Report Not Yet Reviewed by An Attending. Dictated by: Margarita Mail on 09/28/2023 11:44 PM No acute intracranial abnormality. 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 An outpatient pituitary protocol MRI can be obtained for further evaluation as clinically indicated. Moderate cerebral atrophy with mild nonspecific white matter changes. Complete opacification of the left maxillary sinus may represent inflammatory change with infundibular pattern of obstruction. CT DOSE: The maximum CTDIvol = 49.6 mGy. The total DLP = 928 mGy*cm.     XR Knee Complete 4V Lt    Result Date: 09/28/2023  XR KNEE COMPLETE 4V LT [id number]): 09/28/2023 7:03 PM  HISTORY: 89 year old female status post fall. TECHNICAL DATA: 4 views were obtained of the left knee. COMPARISON: Concurrent right knee radiographs FINDINGS: Regional bones are intact without acute fracture seen. There is severe tricompartmental degenerative changes of the left knee with large marginal osteophytes, joint space narrowing, and subchondral sclerotic and cystic changes most prominent in the medial tibiofemoral compartment. There are large patellar and quadriceps tendon enthesophytes. Minimal regional soft tissue swelling is present. There may be a trace joint effusion. IMPRESSION: No acute osseous abnormality of the left knee. Severe tricompartmental degenerative changes of the left knee, most severe in the medial tibiofemoral compartment. Possible trace knee joint effusion. DICTATED: Pearletha Alfred I Reviewed Images Personally and Agree With Interpretation, as currently edited. Electronically SIGNED / Radiologist Percell Belt on 09/28/2023 7:24 PM     XR Knee Complete  4V Rt    Result Date: 09/28/2023  XR KNEE COMPLETE 4V RT [id number]): 09/28/2023 7:03 PM  HISTORY: 89 year old female status post fall. TECHNICAL DATA: 4 views were obtained of the right knee. COMPARISON: Concurrent left knee radiographs. FINDINGS: Regional bones are intact without acute fracture seen. There is severe tricompartmental degenerative changes of the right knee with large marginal osteophytes, joint space narrowing, and subchondral sclerotic and cystic changes most prominent in the lateral tibiofemoral compartment. Mild regional soft tissue swelling is present. There is a small knee joint effusion. IMPRESSION: No acute osseous abnormality of the right knee. Severe tricompartmental degenerative changes of the right knee, most severe in the lateral tibiofemoral compartment. Small knee joint effusion. DICTATED: Pearletha Alfred I Reviewed Images Personally and Agree With Interpretation, as currently edited. Electronically SIGNED / Radiologist Percell Belt on 09/28/2023 7:22 PM     XR Chest Single View    Result Date: 09/28/2023  XR CHEST SINGLE VIEW [id number]): 09/28/2023 7:03 PM HISTORY: 89 year old female status post fall with right-sided chest wall pain. COMPARISON: None FINDINGS: Portable semiupright view of the chest has been obtained. The cardiac silhouette is borderline enlarged. Atherosclerotic calcification is present in the aorta. There is diffuse coarseness of the bronchovascular markings. Left perihilar and bilateral lower lung zone airspace disease is present. There is no obvious pleural effusion or pneumothorax. Regional bones are osteopenic, making evaluation for the presence of subtle rib fractures difficult. There may be an acute fracture of the right third posterolateral rib. Degenerative changes are present in both glenohumeral joints. There is apparent decrease in height of T8 and possibly T10. Soft tissues are unremarkable. IMPRESSION: Borderline cardiomegaly. Probable senescent lung changes and  less likely acute parenchymal pathology. Cannot exclude acute fracture of the right third posterolateral rib. Possible compression fractures  of T8 and T10, chronicity indeterminate. Clinical correlation regarding acute pain recommended. Severe degenerative changes of both glenohumeral joints. DICTATED: Percell Belt I Reviewed Images Personally and Agree With Interpretation, as currently edited. Electronically SIGNED / Radiologist Percell Belt on 09/28/2023 7:17 PM       I independently interpreted the following study(s) (if applicable):  EKG. Please see the interpretation below.     [facility] ED EKG:   Date: September 28, 2023  Time: 18:09:42      Heart rate: 69 bpm  QT/QTc: 420/450 ms  My Independent  Interpretation:  Normal sinus rhythm  Anterior myocardial infarction, age undetermined  T wave abnormality, consider lateral ischemia  Abnormal ECG    EKG initially interpreted as no STEMI by ED provider at 18:13.      Consults:  None    Update(s):   ED Course as of 09/29/23 0101   Sun Sep 29, 2023   0012 CT Head WO Contrast  No acute intracranial abnormality.    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 An outpatient pituitary protocol MRI can be obtained for further evaluation as clinically indicated.    Moderate cerebral atrophy with mild nonspecific white matter changes.    Complete opacification of the left maxillary sinus may represent inflammatory change with infundibular pattern of obstruction.   [KF]     ED Course User Index  [KF] Cephas Darby, MD     Medical Decision Making     Medical Decision Making Discussion      I have reviewed all the available lab data, EKGs, rhythm strips and radiologic images from this visit to the Emergency Department. When possible I compared this information data set with accessible data from prior visits. The remaining comments will be  highlights of this review.     89 y.o. female  with a pertinent history of HTN, dementia, and neuropathy, not on anticoagulation, BIB EMS to the ED s/p unwitnessed mechanical slip and fall yesterday evening. Patient with R sided chest wall pain. Initial vitals stable. On exam, notable for bilateral knee tenderness to palpation with normal ROM. No other traumatic findings noted, patient is GCS 15. Differentials include scalp contusion, knee sprain, UTI, less likely ACS or ICH/fracture. XR of knees unremarkable. CTH show no acute findings, did show incidental possible pituitary lesion. Labs unremarkable. UA negative for infection. On reassessment, patient remains GCS 15 and without distress. Suspect transient AMS as reported by niece due to patient's progression of dementia vs lack of sleep. At this time, pt appears at her baseline as per niece.  However, patient is currently no longer altered or in distress. Signed out to oncoming provider, Dr. Greig Castilla, pending troponin and likely discharge if not significantly elevated.    Social determinants that significantly affected care:  None    Discussion of management with other physicians or appropriate source:   None.    Escalation of care including admission/OBS considered:   None.    Diagnostic test(s) and clinical decision rule(s) considered:   As ordered above, Labs, and Imaging.     Prescription drugs considered:  None.    Diagnosis:   1. Fall, initial encounter    2. Pituitary macroadenoma (HCC-CMS)    3. Sinusitis, unspecified chronicity, unspecified location      Disposition: Signed out to oncoming attending, Dr. Greig Castilla     Condition: stable  Counseled: Patient or relevant medical proxy, regarding diagnosis, regarding diagnostic results, regarding treatment plan, and patient or medical proxy indicated understanding.     [name], Medical Scribe  09/29/23  1:05 AM    By signing below, I acknowledge that I have dictated this note to the above named scribe, and I  have reviewed the above note for accuracy and edited where necessary. The note accurately reflects the services provided by me at this encounter. I personally made/approved the management plan and take responsibility for the patient management. Attending attestation: I performed a history and physical examination of the patient and discussed their management with the resident physician. I reviewed and edited the resident physician's note and agree with the documented findings and plan of care.      Francee Gentile, DO  09/29/23 1324    Electronically signed by Francee Gentile, DO at 09/29/2023  1:46 AM PST

## 2023-10-11 NOTE — Telephone Encounter (Signed)
 Kimberly [name] with [name] [employer] is requesting a referral for hospice from any provider he can be reached @ [phone] ( f ) [phone]

## 2023-10-11 NOTE — Telephone Encounter (Signed)
 Returned call to [employer].  Left voice message.

## 2023-10-11 NOTE — Telephone Encounter (Signed)
 Kimberly [name] with [facility] [employer] is requesting a referral for hospice from any provider he can be reached @ [phone] ( f ) [phone]

## 2023-10-11 NOTE — Telephone Encounter (Signed)
 LSW attempted phone call to patient's mother, Rayanna regarding social work referral due to family disruption due to long term absence of family member.   Phone rang, no answer, unable to leave message.   If patient's mother has contact with office, please advise patient to return call to LSW work cell phone at [phone].

## 2023-10-11 NOTE — Unmapped (Signed)
 Spoke to Dr. Darleen Crocker from [facility].  Updated him regarding patient's hypertension and dental pain this week with HBOT. Reviewed Dr. Vena Austria recommendations for urgent dental extraction followed by resumption of HBOT.  Dr. Darleen Crocker stated that he will schedule patient for extraction ASAP with plan to follow along post extraction to ensure good healing.  Letter sent to Dr. Darleen Crocker regarding findings and recommendations.

## 2023-10-11 NOTE — Telephone Encounter (Signed)
 LVM FOR PATIENT: We are faxing the order to your insurance, although their has been a "no answer" notification so far so we will keep trying.     I talked to our DME coordinator, and we actually do not stock quad canes in our office. I will fax the order to a [place] DME supplier called [facility] who should have them in stock. She can call them next week at [phone] and they should have her order by then.     I tried using [employer] for a supplier but they don't take insurance.

## 2023-10-11 NOTE — Unmapped (Signed)
 Formatting of this note is different from the original.  Images from the original note were not included.     [id number] en   Sinusitis (No Antibiotics)     The sinuses are air-filled spaces in the bones of the face. They connect to the inside of the nose. Sinusitis is redness and swelling (inflammation) of the tissue that lines the sinuses. It can occur during a cold. It can also be due to allergies to pollens and other particles in the air. It can cause symptoms, such as sinus congestion, headache, sore throat, face swelling, and a feeling of fullness. It may also cause a low-grade fever. This type of sinusitis is not caused by an infection with bacteria. So antibiotics aren't used to treat it.   Home care     Drink plenty of water, hot tea, and other liquids, as directed by the healthcare provider. This may help thin nasal mucus. It also may help your sinuses drain fluids.     Heat may help soothe painful parts of your face. Use a towel soaked in hot water. Or stand in the shower and direct the warm spray on your face. Using a vaporizer along with a menthol rub at night may also help soothe symptoms.      An expectorant with guaifenesin may help thin nasal mucus and help your sinuses drain fluids. If you have any questions about an over-the-counter medicine or its side effects, talk with your healthcare provider or pharmacist before taking it.     You can use an over-the-counter decongestant, unless a similar medicine was prescribed to you. Nasal sprays work the fastest. Use one that has phenylephrine or oxymetazoline. But only use it for a total of 3 days. First blow your nose gently. Then use the spray. Don't use these medicines more often than directed on the label. If you do, your symptoms may get worse. You may also take pills that contain pseudoephedrine for up to 7 days. Don?t use products that combine multiple medicines. This may increase side effects. Read all medicine labels. You can also ask the  pharmacist for help. (People with high blood pressure should not use decongestants. They can raise blood pressure.)     Over-the-counter antihistamines or steroid nasal sprays, if advised by the healthcare provider, may help if allergies were the cause of your sinusitis.     Use acetaminophen or ibuprofen to control pain, unless another pain medicine was prescribed to you. If you have long-term (chronic) liver or kidney disease, or ever had a stomach ulcer, talk with your provider before using these medicines. Aspirin should never be taken by anyone under age 32 unless directed by the provider. It can lead to a risk for Reye syndrome. This is a rare but very serious disorder that most often affects the brain and the liver.     Use nasal rinses or irrigation with saltwater as directed by your provider.     Don't smoke. This can make symptoms worse.     Follow-up care   Follow up with your healthcare provider if you are not better in 1 week.     When to get medical care   Call your healthcare provider if any of these occur:     Green or yellow fluid draining from your nose or into your throat     Facial pain or headache that gets worse     Stiff neck     Swelling of your forehead or eyelids  Vision problems, such as blurred or double vision     Fever of 100.40F (38C) or higher, or as advised by your provider     Symptoms that don't go away in 10 days or get worse after starting to get better     Call 911   Call 911 if any of the following occur:     Seizure     Trouble breathing     Abnormal drowsiness or confusion     Feeling dizzy or faint     Fingernails, skin, or lips look blue, purple, or gray     Last Reviewed Date: 07/06/2021 00:00:00    2000-2025 [employer], [employer]. All rights reserved. This information is not intended as a substitute for professional medical care. Always follow your healthcare professional's instructions.       Electronically signed by Council Mechanic, MD at  09/29/2023  1:28 AM PST

## 2023-10-11 NOTE — Plan of Care (Signed)
 Problem: Discharge Planning  Goal: Discharge to home or other facility with appropriate resources  10/11/2023 1013 by Senaida Lange, RN  Outcome: Progressing     Problem: Safety - Adult  Goal: Free from fall injury  10/11/2023 1013 by Senaida Lange, RN  Outcome: Progressing     Problem: Skin/Tissue Integrity  Goal: Skin integrity remains intact  Description: 1.  Monitor for areas of redness and/or skin breakdown  2.  Assess vascular access sites hourly  3.  Every 4-6 hours minimum:  Change oxygen saturation probe site  4.  Every 4-6 hours:  If on nasal continuous positive airway pressure, respiratory therapy assess nares and determine need for appliance change or resting period  10/11/2023 1013 by Senaida Lange, RN  Outcome: Progressing     Problem: Pain  Goal: Verbalizes/displays adequate comfort level or baseline comfort level  10/11/2023 1013 by Senaida Lange, RN  Outcome: Progressing     Problem: Neurosensory - Adult  Goal: Achieves stable or improved neurological status  10/11/2023 1013 by Senaida Lange, RN  Outcome: Progressing       Problem: Neurosensory - Adult  Goal: Absence of seizures  10/11/2023 1013 by Senaida Lange, RN  Outcome: Progressing  10/10/2023 2300 by Ernestine Mcmurray, RN  Outcome: Progressing     Problem: Respiratory - Adult  Goal: Achieves optimal ventilation and oxygenation  10/11/2023 1013 by Senaida Lange, RN  Outcome: Progressing     Problem: Cardiovascular - Adult  Goal: Maintains optimal cardiac output and hemodynamic stability  10/11/2023 1013 by Senaida Lange, RN  Outcome: Progressing     Problem: Skin/Tissue Integrity - Adult  Goal: Skin integrity remains intact  Description: 1.  Monitor for areas of redness and/or skin breakdown  2.  Assess vascular access sites hourly  3.  Every 4-6 hours minimum:  Change oxygen saturation probe site  4.  Every 4-6 hours:  If on nasal continuous positive airway pressure, respiratory therapy assess nares and determine need for  appliance change or resting period  10/11/2023 1013 by Senaida Lange, RN  Outcome: Progressing     Problem: Skin/Tissue Integrity - Adult  Goal: Incisions, wounds, or drain sites healing without S/S of infection  10/11/2023 1013 by Senaida Lange, RN  Outcome: Progressing     Problem: Musculoskeletal - Adult  Goal: Maintain proper alignment of affected body part  10/11/2023 1013 by Senaida Lange, RN  Outcome: Progressing     Problem: Gastrointestinal - Adult  Goal: Maintains or returns to baseline bowel function  10/11/2023 1013 by Senaida Lange, RN  Outcome: Progressing     Problem: Infection - Adult  Goal: Absence of infection at discharge  10/11/2023 1013 by Senaida Lange, RN  Outcome: Progressing

## 2023-10-11 NOTE — Progress Notes (Unsigned)
 The patient (or guardian, if applicable) and other individuals in attendance with the patient were advised that Artificial Intelligence will be utilized during this visit to record and process the conversation to generate a clinical note. The patient (or guardian, if applicable) and other individuals in attendance at the appointment consented to the use of AI, including the recording.      HPI:     History of Present Illness  The patient presents for evaluation of spitting up, choking, and vomiting. He is accompanied by his mother.    The patient's mother reports an improvement in his mood, describing him as happier and more upbeat. However, she notes an increase in the frequency of spitting up, choking, and vomiting episodes. These episodes are characterized by deep swallows, which occur post-feeding rather than during active swallowing. The mother also reports instances of choking during sleep, necessitating the use of a bed monitor. These choking episodes are brief, lasting approximately 15 seconds, and are not associated with cyanosis. His food intake has decreased from 4 ounces to approximately 3 to 3.5 ounces per feeding. A change in stool color to green has been observed following the introduction of a new formula. He has experienced projectile vomiting on two occasions, but typically the vomitus is milk-like and not associated with blood or bile. The mother does not report any signs of irritability following these episodes. Currently, he has not had a bowel movement in about three feedings, which appears to cause him discomfort.       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    No current outpatient medications on file prior to visit.     No current facility-administered medications on file prior to visit.      Allergies:  No Known Allergies  Objective:     Vitals:    10/11/23 1335   Pulse: 138   Resp: 40   Temp: 98.5 F (36.9 C)   SpO2: 100%   Weight: 3.419 kg (7 lb 8.6 oz)   Height: 49.7 cm (19.55")      No blood pressure reading on file for this encounter.   Physical Exam  The patient appears comfortable and well. He is well nourished.  The skin is well perfused. The mouth is clear. Lesions are moist. The palate is normal. There is no tongue tie.  The lungs are clear. Breathing is completely normal.  The heart sounds are normal. Regular rhythm. No extra sounds.  The abdomen is soft, nontender. No mass. No organomegaly. No abdominal distention of note.  The genitalia is normal. Testes are descended and normal.  There is no rash. No perianal lesions.    Vital Signs  Weight is 8.6 pounds.     ***  No results found for any visits on 10/11/23.     Assessment/Plan:     1. Gastroesophageal reflux disease, unspecified whether esophagitis present  -     famotidine (PEPCID) 40 MG/5ML suspension; Take 0.5 mLs by mouth daily, Disp-100 mL, R-1Normal     Assessment & Plan  1. Spitting up, choking, and vomiting.  Despite the current formula not significantly alleviating his symptoms, there is no evidence of bowel obstruction or other complications contributing to the spitting up. His growth trajectory remains satisfactory, with a weight gain of 8 ounces over the past 10 days. The mother was reassured that the spitting up is unlikely to cause airway obstruction. She was advised to continue with the current formula for an additional 5 days. A medication to reduce gastric acidity will be initiated, which may help decrease the gagging and choking episodes. The potential side effects of this medication, including an increased risk of stomach bugs, were discussed. The possibility of  thickening the formula was also considered, but the mother opted against this due to concerns about potential gas and constipation. A prescription for the acid-reducing medication [facility] be sent to [facility]. If the spitting up persists but he becomes less fussy, it may indicate that the medication is effective. If the spitting up ceases within the next 5 days, it could suggest that the formula is the underlying cause.    Follow-up  The patient will follow up in 2 weeks.     ***  Follow-up and Dispositions    Return in about 2 weeks (around 10/25/2023) for Routine Well Check as scheduled, and anytime needed (if worsening).         Billing:     Level of service for this encounter was determined based on:  - Medical Decision Making***  - Time, with the total time spent on the day of service of ***

## 2023-10-13 MED ORDER — GADOTERIDOL 279.3 MG/ML IV SOLN
279.3 | Freq: Once | INTRAVENOUS | Status: DC | PRN
Start: 2023-10-13 — End: 2023-10-15

## 2023-10-14 NOTE — Telephone Encounter (Signed)
 Chelsea with [facility] is asking if Dr. Katrinka Blazing can make an order for hospice and fax it to them.     FAX [phone]  PHONE [phone]

## 2023-10-14 NOTE — Telephone Encounter (Signed)
 Kimberly [name] from [facility] is calling for an update on the referral. The family is trying to admit the pt tomorrow. [facility] can be reached at [phone] and their fax is [phone]. Both of these numbers have changed since [name]'s message on 3/7.

## 2023-10-14 NOTE — Progress Notes (Signed)
 I called the patient, no answer, so left a message and advised her the MRI scan was stable, no new lesions and that is good news, she can check MyChart or call us if she has any questions.

## 2023-10-15 NOTE — Telephone Encounter (Signed)
 Returned call to [employer], spoke with [place],  left message for Dane or Jamesetta Orleans to call office.

## 2023-10-17 ENCOUNTER — Encounter

## 2023-10-18 ENCOUNTER — Encounter

## 2023-10-18 NOTE — Telephone Encounter (Signed)
 Faxed to number provided

## 2023-10-18 NOTE — Care Coordination-Inpatient (Signed)
 Ambulatory Care Coordination Note     10/18/2023 11:09 AM     Patient Current Location:  [place]     ACM contacted the caregiver, Rene Kocher, by telephone. Verified name and DOB with caregiver as identifiers.         ACM: Collier Flowers, RN     Challenges to be reviewed by the provider   Additional needs identified to be addressed with provider No  none               Method of communication with provider: none.    Utilization: Patient has not had any utilization since our last call.     Care Summary Note: Called to complete follow up on patient. Told by niece, Rene Kocher, that patient is being seen by Hospice, who is there at this time. Says she will call back. Note On 10/10/23     Chelsea/ [facility] would like a call back at [phone] to get an order for Hospice per family request.   No order seen at this time. Niece say they have consult today.    Offered patient enrollment in the Remote Patient Monitoring (RPM) program for in-home monitoring: Deferred at this time because Family discussing Hospice at this time.; will discuss at next outreach.     Assessments Completed:       10/18/2023     1:47 PM   Amb Fall Risk Assessment and TUG Test   Do you feel unsteady or are you worried about falling?  yes   2 or more falls in past year? no   Fall with injury in past year? no    ,  ,   General Assessment              Medications Reviewed:   Completed during a previous call     Advance Care Planning:   Reviewed and current     Care Planning:   Education Documentation  No documentation found.  Education Comments  No comments found.     ,    Goals Addressed                   This Visit's Progress     Conditions and Symptoms   No change     I will schedule office visits, as directed by my provider.  I will keep my appointment or reschedule if I have to cancel.  I will notify my provider of any barriers to my plan of care.  I will follow my Zone Management tool to seek urgent or emergent care.    Barriers: none and impairment:   physical: Weakness  Plan for overcoming my barriers: compliance  Confidence: 9/10  Anticipated Goal Completion Date: 12/17/23    10/18/23  Spoke with Niece Rene Kocher) who says patient is now Hospice  Will call back to confirm. Consult with [employer] [facility] today               PCP/Specialist follow up:   Future Appointments         Provider Specialty Dept Phone    11/27/2023 1:40 PM Sabino Snipes, MD General Surgery [phone]    03/30/2024 1:40 PM Youlanda Roys, MD Neurology [phone]            Follow Up:   Plan for next ACM outreach in approximately 1 week to complete:  - disease specific assessments  - goal progression  - education .   Caregiver is agreeable to this  plan.

## 2023-10-21 NOTE — Telephone Encounter (Signed)
 [facility])   Phone:  [phone]      Fax:  [phone]  [address]  [place], [place] [address]    Name:  [name]  Date of Birth:  Nov 16, 1923    Incoming call from patient's niece Bernadene Person who is inquiring about home visits for Palliative Medicine or primary care for her aunt Kimberly [name].  This nurse explained that our Palliative Medicine visits are office-based.  [facility] Care at Home is a practice in the Medical [facility] that makes home visits to patients that are homebound and can no longer get in to see their primary care provider in the office.      [name] explains that [name] was in [place] visiting a relative recently.  She fell on 09/26/23 and on 09/28/23 she visited an ED in [place] and was told that she had a possible hairline fracture of one of her right ribs.  She went to the ED in [place] again on 10/07/23 due to SOB.  She was told to follow up with her PCP when she got back to [place].    [name] says that [name] was interested in hospice, she was evaluated by [facility] on 10/18/22 but did not meet criteria.      Per Ambulatory Care Coordinator Josalin Carneiro 10/21/22 note, patient "not ready for hospice, would do better with Palliative In Home Care".  This nurse explained to [name] that [facility] makes home visits, [phone].      Addendum:   [name] called back and stated that she was able to get a follow up appointment for [name] with her current PCP Dr. Otila Kluver on 10/24/23.        Curly Shores, RN, Gerontological Nursing-BC, [facility]

## 2023-10-21 NOTE — Care Coordination-Inpatient (Signed)
 Ambulatory Care Coordination Note     10/21/2023 3:24 PM     Patient Current Location:  [place]     Caregiver, Niece  contacted the ACM by telephone. Verified name and DOB with caregiver as identifiers.         ACM: Collier Flowers, RN     Challenges to be reviewed by the provider   Additional needs identified to be addressed with provider Yes  Niece, Rene Kocher is asking for  Hospice. Had referral and the coordinator says patient would do better with Palliative In Home Care, not ready for Hospice  Referral to [facility].             Method of communication with provider: chart routing.    Utilization: Has the patient been seen in the ED since your last call? no    Care Summary Note: Niece called this ACM stating she need help with getting her aunt the help she needs. Patient now wants to be home care with continued services, but wants all in the home. Referral sent to Jamey Reas, at [facility], [phone].    Offered patient enrollment in the Remote Patient Monitoring (RPM) program for in-home monitoring: Yes, but did not enroll at this time: already monitoring with home equipment.     Assessments Completed:       10/18/2023     1:47 PM   Amb Fall Risk Assessment and TUG Test   Do you feel unsteady or are you worried about falling?  yes   2 or more falls in past year? no   Fall with injury in past year? no    ,  ,   Hypertension - Encounter Level    Symptom course: stable      ,   General Assessment    Do you have any symptoms that are causing concern?: No          Medications Reviewed:   Completed during a previous call     Advance Care Planning:   Reviewed and current     Care Planning:   Education Documentation  No documentation found.  Education Comments  No comments found.     ,    Goals Addressed                   This Visit's Progress     Conditions and Symptoms   No change     I will schedule office visits, as directed by my provider.  I will keep my appointment or reschedule if I  have to cancel.  I will notify my provider of any barriers to my plan of care.  I will follow my Zone Management tool to seek urgent or emergent care.    Barriers: none and impairment:  physical: Weakness  Plan for overcoming my barriers: compliance  Confidence: 9/10  Anticipated Goal Completion Date: 12/17/23    10/18/23  Spoke with Niece Rene Kocher) who says patient is now Hospice  Will call back to confirm. Consult with [employer] [facility] today               PCP/Specialist follow up:   Future Appointments         Provider Specialty Dept Phone    11/27/2023 1:40 PM Sabino Snipes, MD General Surgery [phone]    03/30/2024 1:40 PM Youlanda Roys, MD Neurology [phone]  Follow Up:   Plan for next ACM outreach in approximately 2 weeks to complete:  - disease specific assessments  - goal progression  - education .   Caregiver is agreeable to this plan.

## 2023-10-23 NOTE — Telephone Encounter (Signed)
 Ivy with [facility] (phone number [phone]) needing the diagnoses for the pt's lidocaine (LIDODERM) 5 %

## 2023-10-23 NOTE — Progress Notes (Addendum)
 Paper PA sent to [facility] for Lidocaine 5% patch. Approved from 07/27/23-10/24/24

## 2023-10-24 NOTE — Telephone Encounter (Signed)
 Given to pharmacy:  Associated Diagnoses: Chronic bilateral low back pain, unspecified whether sciatica present [M54.50, G89.29     Paper PA sent to [facility] for Lidocaine 5% patch Per Mrs. Carney Bern notes.    Pharmacy just wanted to see if PA had been initiated.

## 2023-10-25 ENCOUNTER — Encounter

## 2023-10-25 NOTE — Telephone Encounter (Signed)
 [place] spoke with pharmacist, diagnoses verified, medication approved.

## 2023-10-28 ENCOUNTER — Encounter

## 2023-10-28 NOTE — Care Coordination-Inpatient (Signed)
 Ambulatory Care Coordination Note     10/28/2023 1:32 PM     Patient Current Location:  [place]     ACM contacted the caregiver by telephone. Verified name and DOB with patient as identifiers.         ACM: Collier Flowers, RN     Challenges to be reviewed by the provider   Additional needs identified to be addressed with provider Yes  advance care planning-Need order for Hospice-family says she need order to be singed for [facility] to provide Hospice care.  Provider not available to sign order. Patient has seen Dr. Yetta Barre in the pass.  Family has been waiting a month for order to be signed.               Method of communication with provider: chart routing.    Utilization: Has the patient been seen in the ED since your last call? no    Care Summary Note: Caregiver and responsible party has been trying to get an order for Hospice signed. Told that PCP is out and other providers not taking new patients. Patient has been seen by Dr. Yetta Barre in the pass. [facility] need order signed by Provider. Dr Maurine Minister is out. Family was told that any other provider could provide order if he was out.    Offered patient enrollment in the Remote Patient Monitoring (RPM) program for in-home monitoring: Yes, but did not enroll at this time: limited patient ability to navigate RPM/equipment.     Assessments Completed:       10/28/2023     1:26 PM   Amb Fall Risk Assessment and TUG Test   Do you feel unsteady or are you worried about falling?  yes   2 or more falls in past year? no   Fall with injury in past year? no    ,   Ambulatory Care Coordination Assessment    Care Coordination Protocol  Referral from Primary Care Provider: No  Week 1 - Initial Assessment     Do you have all of your prescriptions and are they filled?: Yes  Are you able to afford your medications?: Yes  How often do you have trouble taking your medications the way you have been told to take them?: I always take them as prescribed.           Current Housing: Private  Residence  Who do you live with?: Other Caregiver (Comment: 09-17-22: Patient's niece, Kathlyn Sacramento, resides with the patient.)  Are you an active caregiver in your home?: No                 Thinking about your patient's physical health needs, are there any symptoms or problems (risk indicators) you are unsure about that require further investigation?: Moderate to severe symptoms or problems that impact on daily life   Are the patient's physical health problems impacting on their mental well-being?: Moderate to severe impact upon mental well-being and preventing enjoyment of usual activities   Are there any problems with your patient's lifestyle behaviors (alcohol, drugs, diet, exercise) that are impacting on physical or mental well-being?: Moderate to severe impact on well-being, preventing enjoyment of usual activities   Do you have any other concerns about your patient's mental well-being? How would you rate their severity and impact on the patient?: Moderate to severe problems that interfere with function   How would you rate their home environment in terms of safety and stability (including domestic violence, insecure housing, neighbor  harassment)?: Safe, stable, but with some inconsistency   How do daily activities impact on the patient's well-being? (include current or anticipated unemployment, work, caregiving, access to transportation or other): Contributes to low mood or stress at times   How would you rate their social network (family, work, friends)?: Restricted participation with some degree of social isolation   How would you rate their financial resources (including ability to afford all required medical care)?: Financially secure, some resource challenges   How wells does the patient now understand their health and well-being (symptoms, signs or risk factors) and what they need to do to manage their health?: Little understanding which impacts on their ability to undertake better management   How  well do you think your patient can engage in healthcare discussions? (Barriers include language, deafness, aphasia, alcohol or drug problems, learning difficulties, concentration): Some difficulties in communication with or without moderate barriers   Do other services need to be involved to help this patient?: Other care/services not in place and required   Are current services involved with this patient well-coordinated? (Include coordination with other services you are now recommendation): Required care/services in place with some coordination barriers   Suggested Interventions and Community Resources               ,   Care Coordination Interventions    Referral from Primary Care Provider: No  Suggested Interventions and Community Resources      ,   Hypertension - Encounter Level    Symptom course: improving      ,   General Assessment    Do you have any symptoms that are causing concern?: Yes  Progression since Onset: Gradually Worsening  Reported Symptoms: Other          Medications Reviewed:   Completed during this call    Advance Care Planning:   Reviewed and current     Care Planning:   Education Documentation  No documentation found.  Education Comments  No comments found.     ,    Goals Addressed                   This Visit's Progress     Conditions and Symptoms   No change     I will schedule office visits, as directed by my provider.  I will keep my appointment or reschedule if I have to cancel.  I will notify my provider of any barriers to my plan of care.  I will follow my Zone Management tool to seek urgent or emergent care.    Barriers: none and impairment:  physical: Weakness  Plan for overcoming my barriers: compliance  Confidence: 9/10  Anticipated Goal Completion Date: 12/17/23    10/18/23  Spoke with Niece Rene Kocher) who says patient is now being considered for Hospice  Will call back to confirm. Consult with Sondra Barges [facility] today               PCP/Specialist follow up:   Future Appointments          Provider Specialty Dept Phone    11/27/2023 1:40 PM Sabino Snipes, MD General Surgery [phone]    03/30/2024 1:40 PM Youlanda Roys, MD Neurology [phone]            Follow Up:   Plan for next ACM outreach in approximately 2 weeks to complete:  - CC Protocol assessments  - disease specific assessments  - goal progression  - education .  Caregiver is agreeable to this plan.

## 2023-10-28 NOTE — Telephone Encounter (Signed)
 Last appointment: 09/17/23  Next appointment: Advised to follow-up 12/15/23  Previous refill encounter(s): 08/24/23 #60    Requested Prescriptions     Pending Prescriptions Disp Refills    acetaminophen-codeine (TYLENOL #3) 300-30 MG per tablet [Pharmacy Med Name: ACETAMINOPHEN/COD #3 (300/30MG ) TAB] 60 tablet 0     Sig: Take 1 tablet by mouth every 12 hours as needed for Pain for up to 30 days. Max Daily Amount: 2 tablets         For Pharmacy Admin Tracking Only    Program: Medication Refill  CPA in place:    Recommendation Provided To:   Intervention Detail: New Rx: 1, reason: Patient Preference  Intervention Accepted By:   Eugenia Pancoast Closed?:    Time Spent (min): 5

## 2023-10-28 NOTE — Addendum Note (Signed)
 Addended by: Darlin Priestly on: 10/28/2023 02:15 PM     Modules accepted: Orders

## 2023-10-29 MED ORDER — ACETAMINOPHEN-CODEINE 300-30 MG PO TABS
300-30 | ORAL_TABLET | Freq: Two times a day (BID) | ORAL | 0 refills | Status: AC | PRN
Start: 2023-10-29 — End: 2023-11-27

## 2023-10-30 NOTE — Addendum Note (Signed)
 Addended by: Esteban Kobashigawa, [place] L on: 10/30/2023 10:05 AM     Modules accepted: Orders

## 2023-10-30 NOTE — Progress Notes (Signed)
 Per Dr. Maurine Minister response to message routed to me: Niece, Kimberly [name] is asking for  Hospice. Had referral and the coordinator says patient would do better with Palliative In Home Care, not ready for Hospice Referral to [facility].      Please, could you place referral to palliative as indicated below? They want Referral to [facility].       Signed by cover Provider Danelle Berry

## 2023-11-06 NOTE — Care Coordination-Inpatient (Signed)
 Ambulatory Care Coordination Note     11/06/2023 11:36 AM     Patient outreach attempt by this ACM today to perform care management follow up . ACM was unable to reach the patient by telephone today;   left voice message requesting a return phone call to this ACM.     ACM: Collier Flowers, RN     Care Summary Note: Left VM for niece, Rene Kocher to return call for update on patient.    PCP/Specialist follow up:   Future Appointments         Provider Specialty Dept Phone    11/27/2023 1:40 PM Sabino Snipes, MD General Surgery [phone]    03/30/2024 1:40 PM Youlanda Roys, MD Neurology [phone]            Follow Up:   Plan for next ACM outreach in approximately 1 week to complete:  - CC Protocol assessments  - disease specific assessments  - SDOH assessments  - goal progression  - education .

## 2023-11-12 NOTE — Telephone Encounter (Signed)
 EC Bernadene Person requesting a call back at [phone] about patient being in Hospice.

## 2023-11-14 NOTE — Telephone Encounter (Signed)
 Ms. Kimberly [name]  had reach out to [facility] who sent out a Provider that stated her ribs are healing fine.    [facility] as accepted her a patient for Hospice Care.

## 2023-11-18 NOTE — Care Coordination-Inpatient (Signed)
 Ambulatory Care Coordination Note     11/18/2023 5:47 PM     Patient outreach attempt by this ACM today to perform care management follow up . ACM was unable to reach the patient by telephone today;   left voice message requesting a return phone call to this ACM.     ACM: Murriel Arrow, RN     PCP/Specialist follow up:   Future Appointments         Provider Specialty Dept Phone    11/27/2023 1:40 PM Gustabo Legions, MD General Surgery [phone]    03/30/2024 1:40 PM Herschell Lore, MD Neurology [phone]            Follow Up:   Plan for next ACM outreach in approximately 1 week to complete:  - CC Protocol assessments  - disease specific assessments  - goal progression  - education .

## 2023-11-27 ENCOUNTER — Ambulatory Visit: Payer: MEDICARE | Attending: Surgery | Primary: Family Medicine

## 2023-11-27 NOTE — Care Coordination-Inpatient (Signed)
 Ambulatory Care Coordination Note     11/27/2023      Patient Current Location:  [place]      ACM contacted the caregiver by telephone. Verified name and DOB with caregiver as identifiers.     Patient closed (entered hospice) from the High Risk Care Management program on 11/27/2023.  Caregiver verbalizes confidence in the ability to self-manage at this time..  Care management goals have been completed. No further Ambulatory Care Manager follow up scheduled.      ACM: Murriel Arrow, RN     Challenges to be reviewed by the provider   Additional needs identified to be addressed with provider No  none- Patient admitted to Hospice with [facility]  On 11/14/23             Method of communication with provider: chart routing.    Utilization: Has the patient been seen in the ED since your last call? no    Care Summary Note: Episode closed, Patient admitted to Hospice with [facility] on 11/14/23.    Follow Up: None

## 2023-12-04 NOTE — Care Coordination-Inpatient (Signed)
 12/04/23  Call from [facility], stating they need records sent to them to determine if patient has Cardiac issues. Need to review for Hospice referral. Please send to Clois Dandy at Fax # [phone]. Or Call at [phone]. Thank-you.

## 2023-12-08 ENCOUNTER — Emergency Department: Admit: 2023-12-08 | Payer: MEDICARE | Primary: Family Medicine

## 2023-12-08 ENCOUNTER — Inpatient Hospital Stay
Admit: 2023-12-08 | Discharge: 2023-12-09 | Disposition: A | Discharge status: Home / Self Care | Payer: MEDICARE | Attending: Student in an Organized Health Care Education/Training Program

## 2023-12-08 DIAGNOSIS — S300XXA Contusion of lower back and pelvis, initial encounter: Secondary | ICD-10-CM

## 2023-12-08 NOTE — ED Provider Notes (Signed)
 [facility]  EMERGENCY DEPARTMENT ENCOUNTER       Pt Name: Kimberly [name]  MRN: [id number]  Birthdate [birth year]  Date of evaluation: 12/08/2023  Provider: Marceline Ser, MD   PCP: Fannie Homestead, MD  Note Started: 7:22 PM EDT 12/08/23     CHIEF COMPLAINT       Chief Complaint   Patient presents with    Fall     Bibems from home for fall that happened last night. Pt reports she fell back and slid down onto a wall and bumped the back of her head. Pt denies LOC. Initially refused transport last night. Home health went to see her today and recommended her to come get evaluated. Pt reports having pain on her buttocks.         HISTORY OF PRESENT ILLNESS: 1 or more elements      History From: patient, History limited by: none     Kimberly [name] is a 89 y.o. female presenting with a fall.  She fell last night.  Mechanical fall backwards against a wall.  She did hit her head but no LOC.  Notes sacral pain.         Please See MDM for Additional Details of the HPI/PMH  Nursing Notes were all reviewed and agreed with or any disagreements were addressed in the HPI.     REVIEW OF SYSTEMS        Positives and Pertinent negatives as per HPI.    PAST HISTORY     Past Medical History:  Past Medical History:   Diagnosis Date    Adverse effect of anesthesia     slow to wake    Anginal pain     Arthritis     Breast cancer (HCC) 03/10/2018     left lumpectomy    Cancer (HCC) 01/2018    breast    Chronic pain     neuropathy    Constipation     GERD (gastroesophageal reflux disease)     Headache     Hearing loss     High cholesterol     Hypertension     Incontinence     Joint pain     Memory disorder     Muscle pain     Osteoporosis     Ringing in the ears     Snoring     Visual disturbance        Past Surgical History:  Past Surgical History:   Procedure Laterality Date    BREAST LUMPECTOMY Left 03/10/2018    LEFT BREAST LUMPECTOMY WITH ULTRASOUND GUIDANCE performed by Gustabo Legions, MD at [facility]  AMBULATORY OR    HYSTERECTOMY (CERVIX STATUS UNKNOWN)      ORTHOPEDIC SURGERY      epidural pain mtg - pt unsure    OTHER SURGICAL HISTORY Right 10/15/2016    ligament correction right foot    OTHER SURGICAL HISTORY  11/26/2016    Pituitary tumor removal    US  BREAST BIOPSY W LOC DEVICE 1ST LESION LEFT Left 01/21/2018    US  BREAST NEEDLE BIOPSY LEFT 01/21/2018 [facility] RAD US        Family History:  Family History   Problem Relation Age of Onset    Dementia Sister     Dementia Brother     Cancer Daughter     Breast Cancer Daughter 56    Breast Cancer Sister 77    Breast Cancer Mother 29  No Known Problems Father        Social History:  Social History     Tobacco Use    Smoking status: Never     Passive exposure: Never    Smokeless tobacco: Never   Vaping Use    Vaping status: Never Used   Substance Use Topics    Alcohol use: Not Currently    Drug use: No       Allergies:  Allergies   Allergen Reactions    Penicillins Hives and Myalgia    Sulfa Antibiotics Hives     Other reaction(s): Unknown (comments)       CURRENT MEDICATIONS      Previous Medications    ACETAMINOPHEN  (TYLENOL ) 500 MG TABLET    Take by mouth every 6 hours as needed for Pain    AMMONIUM LACTATE (LAC-HYDRIN) 12 % LOTION    Apply 1 Application topically 2 times daily as needed    ASCORBIC ACID (VITAMIN C) 100 MG TABLET    Take 1 tablet by mouth daily    CARBAMIDE PEROXIDE (DEBROX) 6.5 % OTIC SOLUTION    Place 5 drops in ear(s) 2 times daily    CETIRIZINE  (ZYRTEC ) 5 MG TABLET    TAKE 1 TABLET BY MOUTH DAILY    CHLORHEXIDINE (PERIDEX) 0.12 % SOLUTION    Swish and spit 15 mLs 2 times daily    CHOLECALCIFEROL (VITAMIN D3) 50 MCG (2000 UT) CAPS    Take 1 capsule by mouth daily    DONEPEZIL  (ARICEPT ) 5 MG TABLET    TAKE 1 TABLET BY MOUTH DAILY BEFORE DINNER    DULOXETINE  (CYMBALTA ) 30 MG EXTENDED RELEASE CAPSULE    Take 1 capsule by mouth Daily with supper    DULOXETINE  (CYMBALTA ) 60 MG EXTENDED RELEASE CAPSULE    TAKE 1 CAPSULE BY MOUTH DAILY WITH BREAKFAST     OMEGA-3 FATTY ACIDS (FISH OIL) 1000 MG CPDR    Take 3 capsules by mouth    PREGABALIN  (LYRICA ) 200 MG CAPSULE    Take 1 capsule by mouth 2 times daily for 180 days. Max Daily Amount: 400 mg    ROPINIROLE  (REQUIP ) 0.25 MG TABLET    Take 1 tablet by mouth nightly    VALSARTAN  (DIOVAN ) 160 MG TABLET    Take 1 tablet by mouth daily    VITAMIN E 1000 UNITS CAPSULE    Take 1 capsule by mouth daily    ZINC 30 MG CAPS    Take by mouth       SCREENINGS               No data recorded            PHYSICAL EXAM      ED Triage Vitals [12/08/23 1746]   Encounter Vitals Group      BP (!) 143/63      Systolic BP Percentile       Diastolic BP Percentile       Pulse 86      Respirations 15      Temp 98.7 F (37.1 C)      Temp Source Oral      SpO2 90 %      Weight - Scale 67.2 kg (148 lb 2.4 oz)      Height 1.575 m (5\' 2" )      Head Circumference       Peak Flow       Pain Score  Pain Loc       Pain Education       Exclude from Growth Chart         Physical Exam  Vitals and nursing note reviewed.   Constitutional:       Appearance: Normal appearance.   HENT:      Head: Normocephalic.   Eyes:      Extraocular Movements: Extraocular movements intact.      Pupils: Pupils are equal, round, and reactive to light.   Cardiovascular:      Rate and Rhythm: Normal rate.   Pulmonary:      Effort: Pulmonary effort is normal.   Abdominal:      General: Abdomen is flat.   Musculoskeletal:      Cervical back: Normal range of motion.      Comments: Ttp over sacrum, no CTL spine pain.  No hematoma or ecchymosis.     Skin:     General: Skin is dry.   Neurological:      General: No focal deficit present.      Mental Status: She is alert.   Psychiatric:         Mood and Affect: Mood normal.         Behavior: Behavior normal.          DIAGNOSTIC RESULTS   LABS:     No results found for this or any previous visit (from the past 24 hours).    EKG: If performed, independent interpretation documented below in the MDM section  All EKGs independently  interpreted by me.    RADIOLOGY:  Non-plain film images such as CT, Ultrasound and MRI are read by the radiologist. Plain radiographic images are visualized and preliminarily interpreted by the ED Provider with the findings documented in the MDM section.     Interpretation per the Radiologist below, if available at the time of this note:     XR PELVIS (1-2 VIEWS)   Final Result   No acute fracture or dislocation.      Electronically signed by Reford Canterbury HABIB      XR SACRUM COCCYX (MIN 2 VIEWS)   Final Result   No acute fracture or dislocation.      Electronically signed by Reford Canterbury HABIB      CT HEAD WO CONTRAST   Final Result      No evidence for acute intracranial abnormality                  Electronically signed by Sheril Dines           PROCEDURES   Unless otherwise noted below, none  Procedures     CRITICAL CARE TIME   none    EMERGENCY DEPARTMENT COURSE and DIFFERENTIAL DIAGNOSIS/MDM   Vitals:    Vitals:    12/08/23 1746   BP: (!) 143/63   Pulse: 86   Resp: 15   Temp: 98.7 F (37.1 C)   TempSrc: Oral   SpO2: 90%   Weight: 67.2 kg (148 lb 2.4 oz)   Height: 1.575 m (5\' 2" )        Patient was given the following medications:  Medications - No data to display    Medical Decision Making  89yoF with PMH of ckd, cva, dementia presenting with a fall.  She fell last night.  Mechanical fall backwards against a wall.  She did hit her head but no LOC.  Notes sacral pain.    On exam, Ttp  over sacrum, no CTL spine pain.  No hematoma or ecchymosis.      Ddx includes fracture, contusion, ich, skull fx.    Will obtain hip and sacral XR, head CT.  Vitals stable.    I considered getting labwork but patient appears well - not indicated at this time.      Amount and/or Complexity of Data Reviewed  Radiology: ordered.  ECG/medicine tests: ordered.        ED Course as of 12/08/23 1947   Paulene Boron Dec 08, 2023   1944 Reviewed her XR - no signs of fracture of dislocation per my interpretation.  Discussed acetaminophen  at home.   [JS]      ED  Course User Index  [JS] Marceline Ser, MD       FINAL IMPRESSION     1. Contusion of sacrum, initial encounter    2. Blunt head trauma, initial encounter          DISPOSITION/PLAN   Kimberly [name]'s  results have been reviewed with her.  She has been counseled regarding her diagnosis, treatment, and plan.  She verbally conveys understanding and agreement of the signs, symptoms, diagnosis, treatment and prognosis and additionally agrees to follow up as discussed.  She also agrees with the care-plan and conveys that all of her questions have been answered.  I have also provided discharge instructions for her that include: educational information regarding their diagnosis and treatment, and list of reasons why they would want to return to the ED prior to their follow-up appointment, should her condition change.     CLINICAL IMPRESSION    Discharge Note: The patient is stable for discharge home. The signs, symptoms, diagnosis, and discharge instructions have been discussed, understanding conveyed, and agreed upon. The patient is to follow up as recommended or return to ER should their symptoms worsen.      PATIENT REFERRED TO:  Fannie Homestead, MD  [address] [place] [address]  [phone]    In 3 days      [facility] Emergency Department  [address]  [place]  [address]  [phone]    If symptoms worsen       DISCHARGE MEDICATIONS:     Medication List        ASK your doctor about these medications      acetaminophen  500 MG tablet  Commonly known as: TYLENOL      ammonium lactate 12 % lotion  Commonly known as: LAC-HYDRIN     ascorbic acid 100 MG tablet  Commonly known as: VITAMIN C     cetirizine  5 MG tablet  Commonly known as: ZYRTEC   TAKE 1 TABLET BY MOUTH DAILY     chlorhexidine 0.12 % solution  Commonly known as: PERIDEX     Debrox 6.5 % otic solution  Generic drug: carbamide peroxide     donepezil  5 MG tablet  Commonly known as: ARICEPT   TAKE 1 TABLET BY MOUTH DAILY  BEFORE DINNER     * DULoxetine  30 MG extended release capsule  Commonly known as: CYMBALTA   Take 1 capsule by mouth Daily with supper     * DULoxetine  60 MG extended release capsule  Commonly known as: CYMBALTA   TAKE 1 CAPSULE BY MOUTH DAILY WITH BREAKFAST     fish oil 1000 MG Cpdr     pregabalin  200 MG capsule  Commonly known as: LYRICA   Take 1 capsule by mouth 2 times daily for 180 days. Max  Daily Amount: 400 mg     rOPINIRole  0.25 MG tablet  Commonly known as: REQUIP   Take 1 tablet by mouth nightly     valsartan  160 MG tablet  Commonly known as: DIOVAN   Take 1 tablet by mouth daily     vitamin D 50 MCG (2000 UT) Caps capsule  Commonly known as: CHOLECALCIFEROL     vitamin E 1000 units capsule     Zinc 30 MG Caps           * This list has 2 medication(s) that are the same as other medications prescribed for you. Read the directions carefully, and ask your doctor or other care provider to review them with you.                    DISCONTINUED MEDICATIONS:  Current Discharge Medication List          I am the Primary Clinician of Record.   Marceline Ser, MD (electronically signed)    (Please note that parts of this dictation were completed with voice recognition software. Quite often unanticipated grammatical, syntax, homophones, and other interpretive errors are inadvertently transcribed by the computer software. Please disregards these errors. Please excuse any errors that have escaped final proofreading.)         Marceline Ser, MD  12/08/23 [phone]

## 2023-12-08 NOTE — ED Notes (Signed)
 Bedside and Verbal shift change report given to Lamond Pilot RN (oncoming nurse) by Homer Lust RN (offgoing nurse). Report included the following information ED SBAR, MAR, and Recent Results.

## 2023-12-09 LAB — EXTRA TUBES HOLD

## 2023-12-13 NOTE — Telephone Encounter (Signed)
 Allyne Areola [facility]) [phone], would like a call back to discuss if Dr. Jamee Mazzoni will continue to be patient's PCP while on Hospice.

## 2023-12-16 NOTE — Telephone Encounter (Signed)
 Per Dr. Chantal Comment will follow patient while in [place].

## 2023-12-29 LAB — EKG 12-LEAD
Atrial Rate: 79 {beats}/min
P Axis: 51 degrees
P-R Interval: 140 ms
Q-T Interval: 392 ms
QRS Duration: 90 ms
QTc Calculation (Bazett): 449 ms
R Axis: 1 degrees
T Axis: 105 degrees
Ventricular Rate: 79 {beats}/min

## 2024-01-13 ENCOUNTER — Encounter

## 2024-01-13 MED ORDER — CETIRIZINE HCL 5 MG PO TABS
5 | ORAL_TABLET | Freq: Every day | ORAL | 2 refills | 30.00000 days | Status: AC
Start: 2024-01-13 — End: ?

## 2024-01-13 NOTE — Telephone Encounter (Signed)
 Last appointment: 09/17/23  Next appointment: Advised to follow-up 12/15/23  Previous refill encounter(s): 07/29/23 #90 with 1 refill    Requested Prescriptions     Pending Prescriptions Disp Refills    cetirizine  (ZYRTEC ) 5 MG tablet 90 tablet 0     Sig: Take 1 tablet by mouth daily         For Pharmacy Admin Tracking Only    Program: Medication Refill  CPA in place:    Recommendation Provided To:   Intervention Detail: New Rx: 1, reason: Patient Preference  Intervention Accepted By:   Alethia Huxley Closed?:    Time Spent (min): 5

## 2024-01-23 ENCOUNTER — Encounter

## 2024-01-23 MED ORDER — ESTRADIOL 10 MCG VA TABS
10 | ORAL_TABLET | VAGINAL | 3 refills | Status: AC
Start: 2024-01-23 — End: ?

## 2024-01-23 MED ORDER — ACETAMINOPHEN-CODEINE 300-30 MG PO TABS
300-30 | ORAL_TABLET | Freq: Two times a day (BID) | ORAL | 0 refills | Status: AC | PRN
Start: 2024-01-23 — End: 2024-02-22

## 2024-01-23 NOTE — Telephone Encounter (Addendum)
 I show Vagifem was d/c on 05/18/22. Please sign if appropriate.    Last appointment: 09/17/23  Next appointment: Advised to follow-up 12/15/23  Previous refill encounter(s): 10/28/23 Tylenol  w/Codeine  #60, 01/15/23 Cymbalta  30mg , 08/18/21 Vagifem    Requested Prescriptions     Pending Prescriptions Disp Refills    DULoxetine  (CYMBALTA ) 30 MG extended release capsule 90 capsule 1     Sig: Take 1 capsule by mouth Daily with supper    acetaminophen -codeine  (TYLENOL  #3) 300-30 MG per tablet 60 tablet 0     Sig: Take 1 tablet by mouth every 12 hours as needed for Pain for up to 30 days. Max Daily Amount: 2 tablets    Estradiol (VAGIFEM) 10 MCG TABS vaginal tablet 10 tablet 3     Sig: Place 1 tablet vaginally Twice a Week         For Pharmacy Admin Tracking Only    Program: Medication Refill  CPA in place:    Recommendation Provided To:   Intervention Detail: New Rx: 3, reason: Patient Preference  Intervention Accepted By:   Joesph Closed?:    Time Spent (min): 5

## 2024-01-30 NOTE — Telephone Encounter (Signed)
 Patient's niece  Angeline would like a call  back  at [phone] to discuss patient's shortness of breath  with activity. Angeline is concerned that shortness of breath may be cardiac related.

## 2024-01-30 NOTE — Telephone Encounter (Signed)
 Returned call to pt, spoke with Angeline, niece,  stated pt currently on hospice but would like to take her off due to increasing shortness of breath, stated its her heart , advised her to contact her hospice company for instruction.  She stated they are withdrawing pt from hospice,  advised her to take pt to ER for increasing shortness of breath.  She stated understanding

## 2024-02-12 NOTE — Telephone Encounter (Signed)
 Sicero Issacs, Jannah May-ann  [facility] Clinical Staff  ECC Message to Provider    Relationship to Patient: Others niece  Additional Information Patient needs authorization for medical equipment and for assessment for PT and OT.  --------------------------------------------------------------------------------------------------------------------------    Call Back Information: Do not leave any message, patient will call back for answer  Preferred Call Back Number: Phone [phone] (home)

## 2024-02-13 NOTE — Telephone Encounter (Signed)
 Returned call to pt.  Left voice message.

## 2024-02-14 NOTE — Telephone Encounter (Signed)
 Hospital follow up fall/ Patient being discharged from Hospice  Fractured rib  Hospital/ Hospice follow up Appointment scheduled for 03/10/24 and needs to be seen the week of July 14th. Please contact Patient's (EC) [place] at [phone]

## 2024-02-14 NOTE — Telephone Encounter (Signed)
 Appt scheduled for 02/18/24

## 2024-02-14 NOTE — Care Coordination-Inpatient (Signed)
 Ambulatory Care Coordination Note     02/14/2024 2:57 PM     Patient Current Location:  [place]      This patient was received as a referral from the patient (self-referred).    Family, niece,  contacted the ACM by telephone. Verified name and DOB with family as identifiers. Provided introduction to self, and explanation of the ACM role.   Family accepted care management services at this time.          ACM: Tilton CHRISTELLA Bailey, RN     Challenges to be reviewed by the provider   Additional needs identified to be addressed with provider Yes  home health care- Call from [facility] and niece, patient discharged from Hospice. Will need appointment for PT/OT evaluation.                Method of communication with provider: chart routing.    Utilization: Initial Call - N/A    Care Summary Note: Received message from niece, Angeline and from [facility] nurse, stating patient being discharged from Hospice. Will need appointment with PCP for PT/OT evaluation. Patient was seen in [facility] on 12/08/23. Patient had a fall with contusion of sacrum and blunt head trauma. Will need PCP evaluation to continue PT/OT. Sending ACM introduction and Walk in Directory by My Chart.    Offered patient enrollment in the Remote Patient Monitoring (RPM) program for in-home monitoring: Yes, but did not enroll at this time: limited patient ability to navigate RPM/equipment.     Assessments Completed:       02/14/2024     2:50 PM   Amb Fall Risk Assessment and TUG Test   Do you feel unsteady or are you worried about falling?  yes   2 or more falls in past year? no   Fall with injury in past year? no    ,   Ambulatory Care Coordination Assessment    Care Coordination Protocol  Week 1 - Initial Assessment     Do you have all of your prescriptions and are they filled?: Yes  Are you able to afford your medications?: Yes  How often do you have trouble taking your medications the way you have been told to take them?: I always take them as prescribed.           Current Housing:  Private Residence  Who do you live with?: Other Caregiver (Comment: 09-17-22: Patient's niece, Angeline Shaver, resides with the patient.)  Are you an active caregiver in your home?: No                 Suggested Interventions and [facility]               ,  ,   Hypertension - Encounter Level    Symptom course: stable      ,   General Assessment          ,   Social Drivers of Health     Tobacco Use: Low Risk  (09/28/2023)    Received from [facility]    Patient History     Smoking Tobacco Use: Never     Smokeless Tobacco Use: Never     Passive Exposure: Not on file   Alcohol Use: Not At Risk (01/19/2023)    AUDIT-C     Frequency of Alcohol Consumption: Never     Average Number of Drinks: Patient does not drink     Frequency of Binge Drinking: Never  Financial Resource Strain: Low Risk  (01/19/2023)    Overall Financial Resource Strain (CARDIA)     Difficulty of Paying Living Expenses: Not very hard   Food Insecurity: No Food Insecurity (09/17/2023)    Hunger Vital Sign     Worried About Running Out of Food in the Last Year: Never true     Ran Out of Food in the Last Year: Never true   Transportation Needs: No Transportation Needs (09/17/2023)    PRAPARE - Therapist, art (Medical): No     Lack of Transportation (Non-Medical): No   Physical Activity: Inactive (01/19/2023)    Exercise Vital Sign     Days of Exercise per Week: 0 days     Minutes of Exercise per Session: 0 min   Stress: Stress Concern Present (01/19/2023)    [employer] of Occupational Health - Occupational Stress Questionnaire     Feeling of Stress : To some extent   Social Connections: Moderately Isolated (01/19/2023)    Social Connection and Isolation Panel [NHANES]     Frequency of Communication with Friends and Family: Three times a week     Frequency of Social Gatherings with Friends and Family: Once a week     Attends Religious Services: 1 to 4 times per year     Active  Member of [employer] or Organizations: No     Attends Banker Meetings: Never     Marital Status: Widowed   Intimate Partner Violence: Not At Risk (01/19/2023)    Humiliation, Afraid, Rape, and Kick questionnaire     Fear of Current or Ex-Partner: No     Emotionally Abused: No     Physically Abused: No     Sexually Abused: No   Depression: Not at risk (02/14/2024)    PHQ-2     PHQ-2 Score: 1   Housing Stability: Low Risk  (09/17/2023)    Housing Stability Vital Sign     Unable to Pay for Housing in the Last Year: No     Number of Times Moved in the Last Year: 0     Homeless in the Last Year: No   Interpersonal Safety: Not At Risk (03/25/2023)    Interpersonal Safety Domain Source: IP Abuse Screening     Physical abuse: Denies     Verbal abuse: Denies     Emotional abuse: Denies     Financial abuse: Denies     Sexual abuse: Denies   Utilities: Not At Risk (09/17/2023)    AHC Utilities     Threatened with loss of utilities: No        Medications Reviewed:   Completed during this call and 1111F entered: yes    Advance Care Planning:   Reviewed and current     Care Planning:   Education Documentation  Teach reporting changes in condition, taught by Arnaldo Tilton HERO, RN at 02/14/2024  2:55 PM.  Learner: Patient  Readiness: Acceptance  Method: Explanation  Response: Verbalizes Understanding    Discuss alcohol intake, taught by Arnaldo Tilton HERO, RN at 02/14/2024  2:55 PM.  Learner: Patient  Readiness: Acceptance  Method: Explanation  Response: Verbalizes Understanding    Discuss information regarding benefits of regular exercise, taught by Arnaldo Tilton HERO, RN at 02/14/2024  2:55 PM.  Learner: Patient  Readiness: Acceptance  Method: Explanation  Response: Verbalizes Understanding    Discuss information about weight loss, taught by Arnaldo Tilton HERO, RN at 02/14/2024  2:55 PM.  Learner: Patient  Readiness: Acceptance  Method: Explanation  Response: Verbalizes Understanding    Teach appropriate dietary choices, taught  by Arnaldo Tilton HERO, RN at 02/14/2024  2:55 PM.  Learner: Patient  Readiness: Acceptance  Method: Explanation  Response: Verbalizes Understanding    Discuss smoking cessation, taught by Arnaldo Tilton HERO, RN at 02/14/2024  2:55 PM.  Learner: Patient  Readiness: Acceptance  Method: Explanation  Response: Verbalizes Understanding    Discuss recommended lifestyle changes, taught by Arnaldo Tilton HERO, RN at 02/14/2024  2:55 PM.  Learner: Patient  Readiness: Acceptance  Method: Explanation  Response: Verbalizes Understanding    Teach compliance with prescribed medication regimen, taught by Arnaldo Tilton HERO, RN at 02/14/2024  2:55 PM.  Learner: Patient  Readiness: Acceptance  Method: Explanation  Response: Verbalizes Understanding    Discuss information regarding technique to measure blood pressure, taught by Arnaldo Tilton HERO, RN at 02/14/2024  2:55 PM.  Learner: Patient  Readiness: Acceptance  Method: Explanation  Response: Verbalizes Understanding    Teach information regarding medications, taught by Arnaldo Tilton HERO, RN at 02/14/2024  2:55 PM.  Learner: Patient  Readiness: Acceptance  Method: Explanation  Response: Verbalizes Understanding    Teach importance of blood pressure control, taught by Arnaldo Tilton HERO, RN at 02/14/2024  2:55 PM.  Learner: Patient  Readiness: Acceptance  Method: Explanation  Response: Verbalizes Understanding    Educate potential activities for participation, taught by Arnaldo Tilton HERO, RN at 02/14/2024  2:55 PM.  Learner: Patient  Readiness: Acceptance  Method: Explanation  Response: Verbalizes Understanding    Educate energy conservation techniques, taught by Arnaldo Tilton HERO, RN at 02/14/2024  2:55 PM.  Learner: Patient  Readiness: Acceptance  Method: Explanation  Response: Verbalizes Understanding    Educate activity level, taught by Arnaldo Tilton HERO, RN at 02/14/2024  2:55 PM.  Learner: Patient  Readiness: Acceptance  Method: Explanation  Response: Verbalizes  Understanding    Discuss history of factors that may impair activity tolerance, taught by Arnaldo Tilton HERO, RN at 02/14/2024  2:55 PM.  Learner: Patient  Readiness: Acceptance  Method: Explanation  Response: Verbalizes Understanding    Discuss exercises to increase strength and endurance, taught by Arnaldo Tilton HERO, RN at 02/14/2024  2:55 PM.  Learner: Patient  Readiness: Acceptance  Method: Explanation  Response: Verbalizes Understanding    Educate home safety, taught by Arnaldo Tilton HERO, RN at 02/14/2024  2:55 PM.  Learner: Patient  Readiness: Acceptance  Method: Explanation  Response: [place] Understanding    COGNITIVE : FALLS-RISK OF, taught by Arnaldo Tilton HERO, RN at 02/14/2024  2:55 PM.  Learner: Patient  Readiness: Acceptance  Method: Explanation  Response: [place]rell Understanding    COGNITIVE : FALLS-RISK OF, taught by Arnaldo Tilton HERO, RN at 02/14/2024  2:55 PM.  Learner: Patient  Readiness: Acceptance  Method: Explanation  Response: Verbalizes Understanding    Educate safety precautions, taught by Arnaldo Tilton HERO, RN at 02/14/2024  2:55 PM.  Learner: Patient  Readiness: Acceptance  Method: Explanation  Response: Verbalizes Understanding    Education Comments  No comments found.     ,    Goals Addressed                   This Visit's Progress     Conditions and Symptoms        I will schedule office visits, as directed by my provider.  I will keep my appointment or reschedule if I  have to cancel.  I will notify my provider of any barriers to my plan of care.  I will follow my Zone Management tool to seek urgent or emergent care.  I will notify my provider of any symptoms that indicate a worsening of my condition.    Barriers: impairment:  physical:  and cognitive  Plan for overcoming my barriers: PT/OT, compliance  Confidence: 8/10  Anticipated Goal Completion Date: 05/16/24                 PCP/Specialist follow up:   Future Appointments         Provider Specialty Dept Phone    03/10/2024 12:00 PM  Kennie Darin MATSU, MD Family Medicine [phone]    03/30/2024 1:40 PM Claudene Debby LABOR, MD Neurology [phone]            Follow Up:   Plan for next ACM outreach in approximately 1 week to complete:  - CC Protocol assessments  - disease specific assessments  - SDOH assessments  - goal progression  - education   - follow up appointment with pcp.   Caregiver is agreeable to this plan.

## 2024-02-18 ENCOUNTER — Ambulatory Visit: Payer: MEDICARE | Attending: Family Medicine | Primary: Family Medicine

## 2024-02-20 ENCOUNTER — Telehealth: Admit: 2024-02-20 | Discharge: 2024-02-20 | Payer: MEDICARE | Attending: Family Medicine | Primary: Family Medicine

## 2024-02-20 DIAGNOSIS — R062 Wheezing: Secondary | ICD-10-CM

## 2024-02-20 MED ORDER — ALBUTEROL SULFATE (5 MG/ML) 0.5% IN NEBU
Freq: Four times a day (QID) | RESPIRATORY_TRACT | 3 refills | 25.00000 days | Status: DC | PRN
Start: 2024-02-20 — End: 2024-02-21

## 2024-02-20 NOTE — Assessment & Plan Note (Signed)
 Continue current pain medications.  -refer to Home PT/OT.  -recommend keeping hospital bed and transport chair for safety

## 2024-02-20 NOTE — Assessment & Plan Note (Addendum)
 Will continue oxygen concentrator at home.  Getting CXR  Go to ER if new or worsening symptoms.  Will follow up in office.

## 2024-02-20 NOTE — Progress Notes (Signed)
 Chief Complaint   Patient presents with    ED Follow-up     12/08/2023 (2 hours)  [facility] Emergency Department  Contusion of sacrum        Assisted by Niece    Yes   12/08/2023 (2 hours)  [facility] Emergency Department  Contusion of sacrum     "Have you seen or consulted any other health care providers outside of [facility] since your last visit?"    NO       There were no vitals filed for this visit.   Health Maintenance Due   Topic Date Due    Shingles vaccine (1 of 2) Never done    Respiratory Syncytial Virus (RSV) Pregnant or age 72 yrs+ (1 - 1-dose 75+ series) Never done    Pneumococcal 50+ years Vaccine (2 of 2 - PCV) 08/19/2015    COVID-19 Vaccine (4 - 2024-25 season) 04/07/2023    Annual Wellness Visit (Medicare)  01/16/2024      The patient, Kimberly [name], identity was verified by name and DOB, pharmacy verified

## 2024-02-20 NOTE — Assessment & Plan Note (Addendum)
 Unclear etiology. Ongoing since the hospice facility.  -will recommend getting a chest xray  -will start on albuterol  nebulizer  -in person evaluation in the office.  -recommend going to the ER if worsening

## 2024-02-20 NOTE — Assessment & Plan Note (Signed)
 Referring to skilled nursing for medication management

## 2024-02-20 NOTE — Progress Notes (Signed)
 [name]  [facility]  [phone]  [place], [place] [address]  [phone]    Chief Complaint: Acute/chronic medical problems    Subjective  Kimberly [name] is a 89 y.o. Black / [place] American female , established patient, here for evaluation of the concern(s) above;    SUBJECTIVE:     Pt would like to be evaluated for the problem(s) listed below     PMHx: HTN, PVD, pituitary mirroadenoma S/P surgery (follows with Dr. Markel), DM with peripheral neuropathy, DDD , CKD 3a, Malignant neoplasm of left breast, Dementia with behavioral disturbance (follows with neuro), and HLD who presents for follow up of acute/chronic problems;     Present at visit: Kimberly [name] (Niece- primary caregiver)    ED follow up;  Patient discharged from hospice and will need PT/OT evaluation.  Pt was seen in the ED on 12/08/2023 following a fall with sacral contusion.   CT head showed no evidence of acute intracranial abnormality per radiologist report.    Of note, during her stay at hospice, niece states that she developed persistent wheezes and also gradually became dyspneic on exertion.  She has home oxygen from hospice. They recommended following outpt with pcp as hospice only did comfort measures.      Pt denies any  fever, chill, chest pain, abdominal pain, n/v/d.      Other chronic medical problems:    Neuropathy/dementia:  On Lyrica  200mg  BID for neuropathy managed by neurology.  Completed home PT.  Uses a Rolator walker when needed.  Recently saw neuro (Dr. Claudene) for evaluation.    Restless leg - on Ropinirole .        Other Health Habits and social history:  Her only daughter passed away.     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     Review of systems:       A comprehensive review of systems was negative except for that written in the History of Present Illness.         PHYSICAL EXAM:      General: alert, cooperative, no distress. Sitting comfortably on her bed   Mental  status: mental status: alert, oriented to person, place, and time, normal mood, behavior, speech, dress, motor activity, and thought processes   Resp: resp: normal effort and no respiratory distress   Neuro: neuro: no gross deficits   Skin: skin: no discoloration or lesions of concern on visible areas   Due to this being a TeleHealth evaluation, many elements of the physical examination are unable to be assessed.        ASSESSMENT/PLAN:     1. Wheezes  Assessment & Plan:  Unclear etiology. Ongoing since the hospice facility.  -will recommend getting a chest xray  -will start on albuterol  nebulizer  -in person evaluation in the office.  -recommend going to the ER if worsening  Orders:  -     XR CHEST (2 VIEWS); Future  -     albuterol  (PROVENTIL ) (5 MG/ML) 0.5% nebulizer solution; Take 0.5 mLs by nebulization 4 times daily as needed for Wheezing, Disp-30 each, R-3Normal  2. Moderate persistent reactive airway disease without complication  -     albuterol  (PROVENTIL ) (5 MG/ML) 0.5% nebulizer solution; Take 0.5 mLs by nebulization 4 times daily as needed for Wheezing, Disp-30 each, R-3Normal  3. 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 Chronic bilateral low back pain, unspecified whether sciatica present  Assessment & Plan:  Continue current pain medications.  -refer to Home PT/OT.  -recommend keeping hospital bed and transport chair for safety  Orders:  -     [facility], [place]  5. History of stroke  Assessment & Plan:  Continue current pain medications.  -refer to Home PT/OT.  -recommend keeping hospital bed and transport chair for safety  Orders:  -     [facility], [place]  6. SOB (shortness of  breath) on exertion  Assessment & Plan:  Will continue oxygen concentrator at home.  Getting CXR  Go to ER if new or worsening symptoms.  Will follow up in office.        FOLLOW UP: 3 months or sooner if needed.       We discussed the expected course, resolution and complications of the diagnosis(es) in detail. Patient was in [place]  at the time of consultation. Medication risks, benefits, costs, interactions, and alternatives were discussed as indicated.  I advised her to contact the office if her condition worsens, changes or fails to improve as anticipated. She expressed understanding with the diagnosis(es) and plan. Patient understands that this encounter was a temporary measure, and the importance of further follow up and examination was emphasized.  Patient verbalized understanding.           Kimberly [name] is being evaluated by a Virtual Visit (video visit) encounter to address concerns as mentioned above.  A caregiver was present when appropriate. Due to this being a Scientist, research (medical) (During COVID-19 public health emergency), evaluation of the following organ systems was limited: Vitals/Constitutional/EENT/Resp/CV/GI/GU/MS/Neuro/Skin/Heme-Lymph-Imm.  Pursuant to the emergency declaration under the [facility] Act and the [employer], 1135 waiver authority and the [employer] and [employer] Act, this Virtual Visit was conducted with patient's (and/or legal guardian's) consent, to reduce the patient's risk of exposure to COVID-19 and provide necessary medical care.  The patient (and/or legal guardian) has also been advised to contact this office for worsening conditions or problems, and seek emergency medical treatment and/or call 911 if deemed necessary.     Patient identification was verified at the start of the visit: YES     Services were provided through a video synchronous discussion virtually to substitute for in-person clinic visit. Patient was  located at home and provider was located in office or at home.      An electronic signature was used to authenticate this note.     -- Darin Inetta Bumps, MD      CPT Codes [phone] for Established Patients may apply to this Telehealth Visit.  POS code: 74.  Modifier GT

## 2024-02-21 ENCOUNTER — Emergency Department: Payer: MEDICARE | Primary: Family Medicine

## 2024-02-21 ENCOUNTER — Inpatient Hospital Stay
Admit: 2024-02-21 | Discharge: 2024-02-22 | Disposition: A | Discharge status: Home / Self Care | Payer: MEDICARE | Arrived: VH | Attending: Emergency Medicine

## 2024-02-21 ENCOUNTER — Inpatient Hospital Stay: Payer: MEDICARE | Primary: Family Medicine

## 2024-02-21 ENCOUNTER — Telehealth

## 2024-02-21 ENCOUNTER — Encounter

## 2024-02-21 ENCOUNTER — Emergency Department: Admit: 2024-02-21 | Payer: MEDICARE | Primary: Family Medicine

## 2024-02-21 DIAGNOSIS — R0609 Other forms of dyspnea: Principal | ICD-10-CM

## 2024-02-21 LAB — CBC WITH AUTO DIFFERENTIAL
Basophils %: 1.1 % — ABNORMAL HIGH (ref 0.0–1.0)
Basophils Absolute: 0.05 K/UL (ref 0.00–0.10)
Eosinophils %: 3.1 % (ref 0.0–7.0)
Eosinophils Absolute: 0.14 K/UL (ref 0.00–0.40)
Hematocrit: 37.8 % (ref 35.0–47.0)
Hemoglobin: 11.5 g/dL (ref 11.5–16.0)
Immature Granulocytes %: 0.2 % (ref 0.0–0.5)
Immature Granulocytes Absolute: 0.01 K/UL (ref 0.00–0.04)
Lymphocytes %: 14.4 % (ref 12.0–49.0)
Lymphocytes Absolute: 0.65 K/UL — ABNORMAL LOW (ref 0.80–3.50)
MCH: 21.9 pg — ABNORMAL LOW (ref 26.0–34.0)
MCHC: 30.4 g/dL (ref 30.0–36.5)
MCV: 72.1 FL — ABNORMAL LOW (ref 80.0–99.0)
MPV: 10.6 FL (ref 8.9–12.9)
Monocytes %: 10.9 % (ref 5.0–13.0)
Monocytes Absolute: 0.49 K/UL (ref 0.00–1.00)
Neutrophils %: 70.3 % (ref 32.0–75.0)
Neutrophils Absolute: 3.16 K/UL (ref 1.80–8.00)
Nucleated RBCs: 0 /100{WBCs}
Platelets: 273 K/uL (ref 150–400)
RBC: 5.24 M/uL — ABNORMAL HIGH (ref 3.80–5.20)
RDW: 17.5 % — ABNORMAL HIGH (ref 11.5–14.5)
WBC: 4.5 K/uL (ref 3.6–11.0)
nRBC: 0 K/uL (ref 0.00–0.01)

## 2024-02-21 LAB — BRAIN NATRIURETIC PEPTIDE: NT Pro-BNP: 205 pg/mL (ref <450)

## 2024-02-21 LAB — COMPREHENSIVE METABOLIC PANEL
ALT: 26 U/L (ref 12–78)
AST: 23 U/L (ref 15–37)
Albumin/Globulin Ratio: 0.6 — ABNORMAL LOW (ref 1.1–2.2)
Albumin: 2.9 g/dL — ABNORMAL LOW (ref 3.5–5.0)
Alk Phosphatase: 122 U/L — ABNORMAL HIGH (ref 45–117)
Anion Gap: 4 mmol/L (ref 2–12)
BUN/Creatinine Ratio: 23 — ABNORMAL HIGH (ref 12–20)
BUN: 18 mg/dL (ref 6–20)
CO2: 29 mmol/L (ref 21–32)
Calcium: 9.8 mg/dL (ref 8.5–10.1)
Chloride: 105 mmol/L (ref 97–108)
Creatinine: 0.78 mg/dL (ref 0.55–1.02)
Est, Glom Filt Rate: 69 ml/min/1.73m2 (ref >60)
Globulin: 4.7 g/dL — ABNORMAL HIGH (ref 2.0–4.0)
Glucose: 137 mg/dL — ABNORMAL HIGH (ref 65–100)
Potassium: 4.5 mmol/L (ref 3.5–5.1)
Sodium: 138 mmol/L (ref 136–145)
Total Bilirubin: 0.3 mg/dL (ref 0.2–1.0)
Total Protein: 7.6 g/dL (ref 6.4–8.2)

## 2024-02-21 LAB — TROPONIN: Troponin, High Sensitivity: 11 ng/L (ref 0–51)

## 2024-02-21 MED ORDER — ALBUTEROL SULFATE (2.5 MG/3ML) 0.083% IN NEBU
Freq: Four times a day (QID) | RESPIRATORY_TRACT | 3 refills | Status: AC | PRN
Start: 2024-02-21 — End: ?

## 2024-02-21 NOTE — ED Notes (Addendum)
Patient discharged from the ED by Kizzie Bane, DO. Diagnosis, medications, precautions and follow-ups were reviewed with the patient/family. Questions were asked and answered prior to departure. Patient departed the ED via wheelchair and was accompanied by family to car home.

## 2024-02-21 NOTE — ED Provider Notes (Signed)
 [facility]  EMERGENCY DEPARTMENT ENCOUNTER       Pt Name: Kimberly [name]  MRN: [id number]  Birthdate May 02, 1924  Date of evaluation: 02/21/2024  Provider: Norleen Remington, DO   PCP: Kennie Darin MATSU, MD  Note Started: 1:30 AM EDT 02/22/24     CHIEF COMPLAINT       Chief Complaint   Patient presents with    Shortness of Breath        HISTORY OF PRESENT ILLNESS: 1 or more elements      History From: Patient, History limited by: none     Kimberly [name] is a 89 y.o. female presents to the emergency department by private vehicle for evaluation of shortness of breath.       Please See MDM for Additional Details of the HPI/PMH  Nursing Notes were all reviewed and agreed with or any disagreements were addressed in the HPI.     REVIEW OF SYSTEMS        Positives and Pertinent negatives as per HPI.    PAST HISTORY     Past Medical History:  Past Medical History:   Diagnosis Date    Adverse effect of anesthesia     slow to wake    Anginal pain     Arthritis     Breast cancer (HCC) 03/10/2018     left lumpectomy    Cancer (HCC) 01/2018    breast    Chronic pain     neuropathy    Constipation     GERD (gastroesophageal reflux disease)     Headache     Hearing loss     High cholesterol     Hypertension     Incontinence     Joint pain     Memory disorder     Muscle pain     Osteoporosis     Ringing in the ears     Snoring     Visual disturbance        Past Surgical History:  Past Surgical History:   Procedure Laterality Date    BREAST LUMPECTOMY Left 03/10/2018    LEFT BREAST LUMPECTOMY WITH ULTRASOUND GUIDANCE performed by Lucy Ozell PARAS, MD at [facility]    HYSTERECTOMY (CERVIX STATUS UNKNOWN)      ORTHOPEDIC SURGERY      epidural pain mtg - pt unsure    OTHER SURGICAL HISTORY Right 10/15/2016    ligament correction right foot    OTHER SURGICAL HISTORY  11/26/2016    Pituitary tumor removal    US  BREAST BIOPSY W LOC DEVICE 1ST LESION LEFT Left 01/21/2018    US  BREAST NEEDLE BIOPSY LEFT 01/21/2018  [facility] US        Family History:  Family History   Problem Relation Age of Onset    Dementia Sister     Dementia Brother     Cancer Daughter     Breast Cancer Daughter 60    Breast Cancer Sister 11    Breast Cancer Mother 46    No Known Problems Father        Social History:  Social History     Tobacco Use    Smoking status: Never     Passive exposure: Never    Smokeless tobacco: Never   Vaping Use    Vaping status: Never Used   Substance Use Topics    Alcohol use: Not Currently    Drug use: No  Allergies:  Allergies   Allergen Reactions    Penicillins Hives and Myalgia    Sulfa Antibiotics Hives     Other reaction(s): Unknown (comments)       CURRENT MEDICATIONS      Discharge Medication List as of 02/21/2024 10:41 PM        CONTINUE these medications which have NOT CHANGED    Details   acetaminophen -codeine  (TYLENOL  #3) 300-30 MG per tablet Take 1 tablet by mouth every 12 hours as needed for Pain for up to 30 days. Max Daily Amount: 2 tablets, Disp-60 tablet, R-0Normal      cetirizine  (ZYRTEC ) 5 MG tablet Take 1 tablet by mouth daily, Disp-90 tablet, R-2Normal      donepezil  (ARICEPT ) 5 MG tablet TAKE 1 TABLET BY MOUTH DAILY BEFORE DINNER, Disp-30 tablet, R-11Normal      rOPINIRole  (REQUIP ) 0.25 MG tablet Take 1 tablet by mouth nightly, Disp-90 tablet, R-1Normal      pregabalin  (LYRICA ) 200 MG capsule Take 1 capsule by mouth 2 times daily for 180 days. Max Daily Amount: 400 mg, Disp-60 capsule, R-5Normal      DULoxetine  (CYMBALTA ) 60 MG extended release capsule TAKE 1 CAPSULE BY MOUTH DAILY WITH BREAKFAST, Disp-90 capsule, R-1Normal      valsartan  (DIOVAN ) 160 MG tablet Take 1 tablet by mouth daily, Disp-90 tablet, R-1Normal      Zinc 30 MG CAPS Take by mouthHistorical Med      Omega-3 Fatty Acids (FISH OIL) 1000 MG CPDR Take 3 capsules by mouthHistorical Med      chlorhexidine (PERIDEX) 0.12 % solution Swish and spit 15 mLs 2 times dailyHistorical Med      carbamide peroxide (DEBROX) 6.5 % otic solution Place  5 drops in ear(s) 2 times dailyHistorical Med      vitamin E 1000 units capsule Take 1 capsule by mouth dailyHistorical Med      Cholecalciferol (VITAMIN D3) 50 MCG (2000 UT) CAPS Take 1 capsule by mouth dailyHistorical Med      ammonium lactate (LAC-HYDRIN) 12 % lotion Apply 1 Application topically 2 times daily as needed, Topical, 2 TIMES DAILY PRN Starting Thu 03/29/2022, Historical Med      ascorbic acid (VITAMIN C) 100 MG tablet Take 1 tablet by mouth dailyHistorical Med      acetaminophen  (TYLENOL ) 500 MG tablet Take by mouth every 6 hours as needed for PainHistorical Med             SCREENINGS               No data recorded            PHYSICAL EXAM      ED Triage Vitals [02/21/24 1742]   Encounter Vitals Group      BP 116/81      Systolic BP Percentile       Diastolic BP Percentile       Pulse (!) 113      Respirations 22      Temp 98.1 F (36.7 C)      Temp Source Oral      SpO2 99 %      Weight - Scale 68.4 kg (150 lb 12.7 oz)      Height 1.575 m (5' 2)      Head Circumference       Peak Flow       Pain Score       Pain Loc       Pain Education  Exclude from Growth Chart         Physical Exam  Vitals and nursing note reviewed.   Constitutional:       General: She is not in acute distress.     Appearance: Normal appearance. She is obese. She is not ill-appearing.   HENT:      Head: Normocephalic and atraumatic.      Mouth/Throat:      Mouth: Mucous membranes are moist.   Eyes:      General: No scleral icterus.     Extraocular Movements: Extraocular movements intact.      Pupils: Pupils are equal, round, and reactive to light.   Cardiovascular:      Rate and Rhythm: Normal rate and regular rhythm.   Pulmonary:      Effort: Pulmonary effort is normal. No respiratory distress.      Breath sounds: No stridor. Decreased breath sounds present. No wheezing, rhonchi or rales.   Chest:      Chest wall: No tenderness.   Abdominal:      General: There is no distension.      Palpations: Abdomen is soft.       Tenderness: There is no abdominal tenderness.   Musculoskeletal:      Cervical back: Normal range of motion and neck supple.      Right lower leg: No edema.      Left lower leg: No edema.   Skin:     General: Skin is warm and dry.      Capillary Refill: Capillary refill takes less than 2 seconds.   Neurological:      Mental Status: She is alert and oriented to person, place, and time.      Cranial Nerves: No cranial nerve deficit.   Psychiatric:         Mood and Affect: Mood normal.         Behavior: Behavior normal.          DIAGNOSTIC RESULTS   LABS:     Recent Results (from the past 24 hours)   EKG 12 Lead    Collection Time: 02/21/24  5:57 PM   Result Value Ref Range    Ventricular Rate 98 BPM    Atrial Rate 98 BPM    P-R Interval 140 ms    QRS Duration 80 ms    Q-T Interval 336 ms    QTc Calculation (Bazett) 428 ms    P Axis 42 degrees    R Axis -33 degrees    T Axis 90 degrees    Diagnosis       Normal sinus rhythm  Left axis deviation  T wave inversion lateral leads  When compared with ECG of 08-Dec-2023 17:39,  QRS axis shifted left  T wave inversion less evident in Lateral leads  Interpreted by me  Confirmed by Vinita ROSALEA Rush ([id number]) on 02/22/2024 12:34:03 AM     CBC with Auto Differential    Collection Time: 02/21/24  6:23 PM   Result Value Ref Range    WBC 4.5 3.6 - 11.0 K/uL    RBC 5.24 (H) 3.80 - 5.20 M/uL    Hemoglobin 11.5 11.5 - 16.0 g/dL    Hematocrit 62.1 64.9 - 47.0 %    MCV 72.1 (L) 80.0 - 99.0 FL    MCH 21.9 (L) 26.0 - 34.0 PG    MCHC 30.4 30.0 - 36.5 g/dL    RDW 82.4 (H) 88.4 - 14.5 %  Platelets 273 150 - 400 K/uL    MPV 10.6 8.9 - 12.9 FL    Nucleated RBCs 0.0 0 PER 100 WBC    nRBC 0.00 0.00 - 0.01 K/uL    Neutrophils % 70.3 32.0 - 75.0 %    Lymphocytes % 14.4 12.0 - 49.0 %    Monocytes % 10.9 5.0 - 13.0 %    Eosinophils % 3.1 0.0 - 7.0 %    Basophils % 1.1 (H) 0.0 - 1.0 %    Immature Granulocytes % 0.2 0.0 - 0.5 %    Neutrophils Absolute 3.16 1.80 - 8.00 K/UL    Lymphocytes Absolute 0.65  (L) 0.80 - 3.50 K/UL    Monocytes Absolute 0.49 0.00 - 1.00 K/UL    Eosinophils Absolute 0.14 0.00 - 0.40 K/UL    Basophils Absolute 0.05 0.00 - 0.10 K/UL    Immature Granulocytes Absolute 0.01 0.00 - 0.04 K/UL    Differential Type SMEAR SCANNED      RBC Comment ANISOCYTOSIS  1+        RBC Comment MICROCYTOSIS  1+        RBC Comment HYPOCHROMIA  2+       Comprehensive Metabolic Panel    Collection Time: 02/21/24  6:23 PM   Result Value Ref Range    Sodium 138 136 - 145 mmol/L    Potassium 4.5 3.5 - 5.1 mmol/L    Chloride 105 97 - 108 mmol/L    CO2 29 21 - 32 mmol/L    Anion Gap 4 2 - 12 mmol/L    Glucose 137 (H) 65 - 100 mg/dL    BUN 18 6 - 20 MG/DL    Creatinine 9.21 9.44 - 1.02 MG/DL    BUN/Creatinine Ratio 23 (H) 12 - 20      Est, Glom Filt Rate 69 >60 ml/min/1.19m2    Calcium  9.8 8.5 - 10.1 MG/DL    Total Bilirubin 0.3 0.2 - 1.0 MG/DL    ALT 26 12 - 78 U/L    AST 23 15 - 37 U/L    Alk Phosphatase 122 (H) 45 - 117 U/L    Total Protein 7.6 6.4 - 8.2 g/dL    Albumin 2.9 (L) 3.5 - 5.0 g/dL    Globulin 4.7 (H) 2.0 - 4.0 g/dL    Albumin/Globulin Ratio 0.6 (L) 1.1 - 2.2     Troponin    Collection Time: 02/21/24  6:23 PM   Result Value Ref Range    Troponin, High Sensitivity 11 0 - 51 ng/L   Brain Natriuretic Peptide    Collection Time: 02/21/24  6:23 PM   Result Value Ref Range    NT Pro-BNP 205 <450 PG/ML       EKG: If performed, independent interpretation documented below in the MDM section  All EKGs independently interpreted by me.    RADIOLOGY:  Non-plain film images such as CT, Ultrasound and MRI are read by the radiologist. Plain radiographic images are visualized and preliminarily interpreted by the ED Provider with the findings documented in the MDM section.     Interpretation per the Radiologist below, if available at the time of this note:     XR CHEST PORTABLE   Final Result      No acute process on portable chest.         Electronically signed by DARICE COLON           PROCEDURES   Unless otherwise noted  below,  none  Procedures     CRITICAL CARE TIME   none    EMERGENCY DEPARTMENT COURSE and DIFFERENTIAL DIAGNOSIS/MDM   Vitals:    Vitals:    02/21/24 2030 02/21/24 2045 02/21/24 2115 02/21/24 2130   BP: (!) 194/86 (!) 171/79 (!) 203/91 (!) 197/76   Pulse: 79 80 79 79   Resp: 28 29 26 21    Temp:       TempSrc:       SpO2: 94% 96% 99% 99%   Weight:       Height:            Patient was given the following medications:  Medications   ipratropium 0.5 mg-albuterol  2.5 mg (DUONEB ) nebulizer solution 1 Dose (1 Dose Inhalation Given 02/21/24 2052)   methylPREDNISolone  sodium succ (SOLU-MEDROL ) 40 mg in sterile water  1 mL injection (40 mg IntraVENous Given 02/21/24 2054)   albuterol  (PROVENTIL ) (2.5 MG/3ML) 0.083% nebulizer solution 2.5 mg (2.5 mg Nebulization Given 02/21/24 2215)       Medical Decision Making  Amount and/or Complexity of Data Reviewed  Labs: ordered.  Radiology: ordered.  ECG/medicine tests: ordered.    Risk  Prescription drug management.      Patient presents to the emergency department for evaluation of shortness of breath.  This has been ongoing for the past several days.  Her primary care physician had called in a prescription for nebulizer in addition to nebulizer solution.  Patient at one time had been on hospice but had just been recently taken off.  She did have a nebulizer at 1 time but that was taken away when she enrolled in hospice.  Patient has had some hoarseness in her voice.  She denies any fever or chills.  She states that she has had a dry cough.  She denies any abdominal pain, nausea vomiting or diarrhea.    Will obtain blood work to include CBC CMP troponin BNP.  Will obtain chest x-ray imaging to evaluate for infiltrate.  Will obtain EKG to rule out cardiac arrhythmia.  Will order a DuoNeb  treatment here in the emergency department in addition to steroid.      Following the first breathing treatment patient did have notable drop in her O2 sats down to 90%.  Patient given additional nebulizer  treatment improvement in oxygen saturations.  Patient ambulated in the ED without oxygen saturations going below 93%.  Patient be discharged home per the daughter she had gotten notification patient's nebulizer solution was ready at the pharmacy.  Will send out on prednisone   FINAL IMPRESSION     1. Dyspnea on exertion          DISPOSITION/PLAN   Britiany L Marovich's  results have been reviewed with her.  She has been counseled regarding her diagnosis, treatment, and plan.  She verbally conveys understanding and agreement of the signs, symptoms, diagnosis, treatment and prognosis and additionally agrees to follow up as discussed.  She also agrees with the care-plan and conveys that all of her questions have been answered.  I have also provided discharge instructions for her that include: educational information regarding their diagnosis and treatment, and list of reasons why they would want to return to the ED prior to their follow-up appointment, should her condition change.     CLINICAL IMPRESSION    Discharge Note: The patient is stable for discharge home. The signs, symptoms, diagnosis, and discharge instructions have been discussed, understanding conveyed, and agreed upon. The patient is to follow up  as recommended or return to ER should their symptoms worsen.      PATIENT REFERRED TO:  Kennie Darin MATSU, MD  [address] [place] [address]  [phone]      As needed       DISCHARGE MEDICATIONS:     Medication List        START taking these medications      predniSONE  20 MG tablet  Commonly known as: DELTASONE   Take 2 tablets by mouth daily for 5 days            ASK your doctor about these medications      acetaminophen  500 MG tablet  Commonly known as: TYLENOL      acetaminophen -codeine  300-30 MG per tablet  Commonly known as: TYLENOL  #3  Take 1 tablet by mouth every 12 hours as needed for Pain for up to 30 days. Max Daily Amount: 2 tablets     albuterol  (2.5 MG/3ML) 0.083% nebulizer solution  Commonly  known as: PROVENTIL   Take 3 mLs by nebulization 4 times daily as needed for Wheezing     ammonium lactate 12 % lotion  Commonly known as: LAC-HYDRIN     ascorbic acid 100 MG tablet  Commonly known as: VITAMIN C     cetirizine  5 MG tablet  Commonly known as: ZYRTEC   Take 1 tablet by mouth daily     chlorhexidine 0.12 % solution  Commonly known as: PERIDEX     Debrox 6.5 % otic solution  Generic drug: carbamide peroxide     donepezil  5 MG tablet  Commonly known as: ARICEPT   TAKE 1 TABLET BY MOUTH DAILY BEFORE DINNER     DULoxetine  60 MG extended release capsule  Commonly known as: CYMBALTA   TAKE 1 CAPSULE BY MOUTH DAILY WITH BREAKFAST     fish oil 1000 MG Cpdr     pregabalin  200 MG capsule  Commonly known as: LYRICA   Take 1 capsule by mouth 2 times daily for 180 days. Max Daily Amount: 400 mg     rOPINIRole  0.25 MG tablet  Commonly known as: REQUIP   Take 1 tablet by mouth nightly     valsartan  160 MG tablet  Commonly known as: DIOVAN   Take 1 tablet by mouth daily     vitamin D 50 MCG (2000 UT) Caps capsule  Commonly known as: CHOLECALCIFEROL     vitamin E 1000 units capsule     Zinc 30 MG Caps               Where to Get Your Medications        These medications were sent to [facility] #[id number] [facility], [place] - [address] - P [phone] - F [phone]  [address], [place] [address]      Phone: [phone]   albuterol  (2.5 MG/3ML) 0.083% nebulizer solution  predniSONE  20 MG tablet           DISCONTINUED MEDICATIONS:  Discharge Medication List as of 02/21/2024 10:41 PM        STOP taking these medications       Estradiol  (VAGIFEM ) 10 MCG TABS vaginal tablet Comments:   Reason for Stopping:               I am the Primary Clinician of Record.   Norleen Remington, DO (electronically signed)    (Please note that parts of this dictation were completed with voice recognition software. Quite often unanticipated grammatical, syntax, homophones, and  other interpretive errors are inadvertently  transcribed by the computer software. Please disregards these errors. Please excuse any errors that have escaped final proofreading.)          Vinita Norleen BRAVO, DO  02/22/24 0134

## 2024-02-21 NOTE — ED Notes (Signed)
 X-ray at bedside

## 2024-02-21 NOTE — ED Triage Notes (Signed)
 ED visit d/t SOB while at rest - onset of sxs, worsening SOB at home over the past couple of days - Denies fevers / chills / fall / trauma / dizziness / weight gain / increased use of oxygen;;

## 2024-02-21 NOTE — Telephone Encounter (Signed)
 The Albuterol  0.5%  Nebulizer Solution is not covered by pt's insurance. A suggested alternative is the Albuterol  0.083 % Nebulizer Solution   Please review.     For Pharmacy Admin Tracking Only    Program: Medication Refill  Intervention Detail: New Rx: 1, reason: Patient Preference  Time Spent (min): 5    Requested Prescriptions     Pending Prescriptions Disp Refills    albuterol  (PROVENTIL ) (5 MG/ML) 0.5% nebulizer solution 30 each 3     Sig: Take 0.5 mLs by nebulization 4 times daily as needed for Wheezing

## 2024-02-21 NOTE — ED Notes (Signed)
 This RN ambulated the pt watching her sats while ambulating pre MD order. Pt did not desat under 93% on RA. MD made aware.

## 2024-02-21 NOTE — Discharge Instructions (Signed)
 For the next several days would use the nebulizer every 4 hours while awake.

## 2024-02-22 LAB — EKG 12-LEAD
Atrial Rate: 98 {beats}/min
Diagnosis: NORMAL
P Axis: 42 degrees
P-R Interval: 140 ms
Q-T Interval: 336 ms
QRS Duration: 80 ms
QTc Calculation (Bazett): 428 ms
R Axis: -33 degrees
T Axis: 90 degrees
Ventricular Rate: 98 {beats}/min

## 2024-02-22 MED ORDER — IPRATROPIUM-ALBUTEROL 0.5-2.5 (3) MG/3ML IN SOLN
0.5-2.5 | RESPIRATORY_TRACT | Status: AC
Start: 2024-02-22 — End: 2024-02-21
  Administered 2024-02-22: 01:00:00 1 via RESPIRATORY_TRACT

## 2024-02-22 MED ORDER — ALBUTEROL SULFATE (2.5 MG/3ML) 0.083% IN NEBU
RESPIRATORY_TRACT | Status: AC
Start: 2024-02-22 — End: 2024-02-21
  Administered 2024-02-22: 02:00:00 2.5 mg via RESPIRATORY_TRACT

## 2024-02-22 MED ORDER — PREDNISONE 20 MG PO TABS
20 | ORAL_TABLET | Freq: Every day | ORAL | 0 refills | Status: AC
Start: 2024-02-22 — End: 2024-02-26

## 2024-02-22 MED ORDER — STERILE WATER FOR INJECTION (MIXTURES ONLY)
40 | Freq: Once | INTRAMUSCULAR | Status: AC
Start: 2024-02-22 — End: 2024-02-21
  Administered 2024-02-22: 01:00:00 40 mg via INTRAVENOUS

## 2024-02-22 MED FILL — IPRATROPIUM-ALBUTEROL 0.5-2.5 (3) MG/3ML IN SOLN: 0.5-2.5 (3) MG/3ML | RESPIRATORY_TRACT | Qty: 3 | Fill #0

## 2024-02-22 MED FILL — ALBUTEROL SULFATE (2.5 MG/3ML) 0.083% IN NEBU: RESPIRATORY_TRACT | Qty: 3 | Fill #0

## 2024-02-22 MED FILL — METHYLPREDNISOLONE SODIUM SUCC 40 MG IJ SOLR: 40 mg | INTRAMUSCULAR | Qty: 40 | Fill #0

## 2024-02-22 NOTE — Care Coordination-Inpatient (Signed)
 Ambulatory Care Coordination Note  02/21/2024     Patient outreach attempt by this ACM today to perform care management follow up . ACM was unable to reach the patient by telephone today;   left voice message requesting a return phone call to this ACM.     ACM: Tilton CHRISTELLA Bailey, RN    PCP/Specialist follow up:   Future Appointments         Provider Specialty Dept Phone    03/13/2024 2:00 PM Kennie Darin MATSU, MD Family Medicine [phone]    03/30/2024 1:40 PM Claudene Debby LABOR, MD Neurology [phone]            Follow Up:   Plan for next ACM outreach in approximately 1 week to complete:  - CC Protocol assessments  - disease specific assessments  - goal progression  - education .

## 2024-02-23 ENCOUNTER — Emergency Department: Admit: 2024-02-23 | Payer: MEDICARE | Primary: Family Medicine

## 2024-02-23 ENCOUNTER — Inpatient Hospital Stay
Admit: 2024-02-23 | Discharge: 2024-02-24 | Disposition: A | Discharge status: Home / Self Care | Payer: MEDICARE | Arrived: AM | Attending: Emergency Medicine

## 2024-02-23 DIAGNOSIS — S161XXA Strain of muscle, fascia and tendon at neck level, initial encounter: Principal | ICD-10-CM

## 2024-02-23 DIAGNOSIS — S0990XA Unspecified injury of head, initial encounter: Principal | ICD-10-CM

## 2024-02-23 NOTE — ED Provider Notes (Signed)
 [facility]  EMERGENCY DEPARTMENT ENCOUNTER       Pt Name: Kimberly [name]  MRN: [id number]  Birthdate Oct 27, 1923  Date of evaluation: 02/23/2024  Provider: Marlan Sharps, MD   PCP: Kennie Darin MATSU, MD  Note Started: 6:24 PM EDT 02/23/24     CHIEF COMPLAINT       Chief Complaint   Patient presents with    Fall     Patient BIBEMS from home and is able to transfer to wheelchair for triage. Pt reports GLF this afternoon, slipped and fell. Pt landed on back, hit head, no LOC, no thinners. Pt took oxycodone at 1600. Pt ambulatory with assistance. Patient reports pain across her lower back, headache.         HISTORY OF PRESENT ILLNESS: 1 or more elements      History From: Patient and EMS, History limited by: none     Kimberly [name] is a 89 y.o. female patient with history of hypercholesterolemia, breast cancer in remission, hypertension, carotid artery stenosis, peripheral vascular disease, CVA, dementia, here for ground-level fall.  According EMS, the patient slipped and fell this afternoon.  She denies dizziness or lightheadedness.  She landed on her back and hit her head.  She did have moderate pain initially, but denies any significant pain currently.  She also had mid back and low back pain.  She is not on any blood thinners.  She denies chest pain, shortness of breath, dysuria or diarrhea.       Please See MDM for Additional Details of the HPI/PMH  Nursing Notes were all reviewed and agreed with or any disagreements were addressed in the HPI.     REVIEW OF SYSTEMS        Positives and Pertinent negatives as per HPI.    PAST HISTORY     Past Medical History:  Past Medical History:   Diagnosis Date    Adverse effect of anesthesia     slow to wake    Anginal pain     Arthritis     Breast cancer (HCC) 03/10/2018     left lumpectomy    Cancer (HCC) 01/2018    breast    Chronic pain     neuropathy    Constipation     GERD (gastroesophageal reflux disease)     Headache     Hearing loss     High  cholesterol     Hypertension     Incontinence     Joint pain     Memory disorder     Muscle pain     Osteoporosis     Ringing in the ears     Snoring     Visual disturbance        Past Surgical History:  Past Surgical History:   Procedure Laterality Date    BREAST LUMPECTOMY Left 03/10/2018    LEFT BREAST LUMPECTOMY WITH ULTRASOUND GUIDANCE performed by Lucy Ozell PARAS, MD at [facility]    HYSTERECTOMY (CERVIX STATUS UNKNOWN)      ORTHOPEDIC SURGERY      epidural pain mtg - pt unsure    OTHER SURGICAL HISTORY Right 10/15/2016    ligament correction right foot    OTHER SURGICAL HISTORY  11/26/2016    Pituitary tumor removal    US  BREAST BIOPSY W LOC DEVICE 1ST LESION LEFT Left 01/21/2018    US  BREAST NEEDLE BIOPSY LEFT 01/21/2018 [facility] US   Family History:  Family History   Problem Relation Age of Onset    Dementia Sister     Dementia Brother     Cancer Daughter     Breast Cancer Daughter 49    Breast Cancer Sister 61    Breast Cancer Mother 46    No Known Problems Father        Social History:  Social History     Tobacco Use    Smoking status: Never     Passive exposure: Never    Smokeless tobacco: Never   Vaping Use    Vaping status: Never Used   Substance Use Topics    Alcohol use: Not Currently    Drug use: No       Allergies:  Allergies   Allergen Reactions    Penicillins Hives and Myalgia    Sulfa Antibiotics Hives     Other reaction(s): Unknown (comments)       CURRENT MEDICATIONS      Previous Medications    ACETAMINOPHEN  (TYLENOL ) 500 MG TABLET    Take by mouth every 6 hours as needed for Pain    ALBUTEROL  (PROVENTIL ) (2.5 MG/3ML) 0.083% NEBULIZER SOLUTION    Take 3 mLs by nebulization 4 times daily as needed for Wheezing    AMMONIUM LACTATE (LAC-HYDRIN) 12 % LOTION    Apply 1 Application topically 2 times daily as needed    ASCORBIC ACID (VITAMIN C) 100 MG TABLET    Take 1 tablet by mouth daily    CARBAMIDE PEROXIDE (DEBROX) 6.5 % OTIC SOLUTION    Place 5 drops in ear(s) 2 times daily     CETIRIZINE  (ZYRTEC ) 5 MG TABLET    Take 1 tablet by mouth daily    CHLORHEXIDINE (PERIDEX) 0.12 % SOLUTION    Swish and spit 15 mLs 2 times daily    CHOLECALCIFEROL (VITAMIN D3) 50 MCG (2000 UT) CAPS    Take 1 capsule by mouth daily    DONEPEZIL  (ARICEPT ) 5 MG TABLET    TAKE 1 TABLET BY MOUTH DAILY BEFORE DINNER    DULOXETINE  (CYMBALTA ) 60 MG EXTENDED RELEASE CAPSULE    TAKE 1 CAPSULE BY MOUTH DAILY WITH BREAKFAST    OMEGA-3 FATTY ACIDS (FISH OIL) 1000 MG CPDR    Take 3 capsules by mouth    PREDNISONE  (DELTASONE ) 20 MG TABLET    Take 2 tablets by mouth daily for 5 days    PREGABALIN  (LYRICA ) 200 MG CAPSULE    Take 1 capsule by mouth 2 times daily for 180 days. Max Daily Amount: 400 mg    ROPINIROLE  (REQUIP ) 0.25 MG TABLET    Take 1 tablet by mouth nightly    VALSARTAN  (DIOVAN ) 160 MG TABLET    Take 1 tablet by mouth daily    VITAMIN E 1000 UNITS CAPSULE    Take 1 capsule by mouth daily    ZINC 30 MG CAPS    Take by mouth       SCREENINGS               No data recorded            PHYSICAL EXAM      ED Triage Vitals   Encounter Vitals Group      BP 02/23/24 1649 137/68      Systolic BP Percentile --       Diastolic BP Percentile --       Pulse 02/23/24 1649 89  Respirations 02/23/24 1649 18      Temp 02/23/24 1649 97.9 F (36.6 C)      Temp src --       SpO2 02/23/24 1649 93 %      Weight - Scale 02/23/24 1645 68 kg (150 lb)      Height 02/23/24 1645 1.575 m (5' 2)      Head Circumference --       Peak Flow --       Pain Score --       Pain Loc --       Pain Education --       Exclude from Growth Chart --         Physical Exam  Vitals and nursing note reviewed.   HENT:      Head: Normocephalic.   Cardiovascular:      Rate and Rhythm: Normal rate.      Pulses: Normal pulses.   Pulmonary:      Effort: Pulmonary effort is normal.      Breath sounds: Normal breath sounds.   Abdominal:      General: Bowel sounds are normal.   Musculoskeletal:         General: Normal range of motion.      Cervical back: Normal range  of motion.   Skin:     General: Skin is warm.   Neurological:      General: No focal deficit present.      Mental Status: She is alert and oriented to person, place, and time.   Psychiatric:         Mood and Affect: Mood normal.         Behavior: Behavior normal.          DIAGNOSTIC RESULTS   LABS:     No results found for this or any previous visit (from the past 24 hours).    EKG: If performed, independent interpretation documented below in the MDM section  All EKGs independently interpreted by me.    RADIOLOGY:  Non-plain film images such as CT, Ultrasound and MRI are read by the radiologist. Plain radiographic images are visualized and preliminarily interpreted by the ED Provider with the findings documented in the MDM section.     Interpretation per the Radiologist below, if available at the time of this note:     XR CHEST 1 VIEW   Final Result      1. Pulmonary vascular congestion with mild interstitial pulmonary edema.   2. Degenerative change in the lumbar spine without definite acute osseous   abnormality.                  Electronically signed by Baby Stank      XR LUMBAR SPINE (2-3 VIEWS)   Final Result      1. Pulmonary vascular congestion with mild interstitial pulmonary edema.   2. Degenerative change in the lumbar spine without definite acute osseous   abnormality.                  Electronically signed by Baby Stank      CT HEAD WO CONTRAST   Final Result   Head CT demonstrates no evidence of acute intracranial abnormality, skull   fracture or scalp lesion.      Cervical spine CT demonstrate no fracture, vertebral listhesis or facet   malalignment.   Additional chronic findings as noted.  Electronically signed by KATHALENE CHRIST      CT CERVICAL SPINE WO CONTRAST   Final Result   Head CT demonstrates no evidence of acute intracranial abnormality, skull   fracture or scalp lesion.      Cervical spine CT demonstrate no fracture, vertebral listhesis or facet   malalignment.   Additional  chronic findings as noted.            Electronically signed by KATHALENE CHRIST           PROCEDURES   Unless otherwise noted below, none  Procedures     CRITICAL CARE TIME   0    EMERGENCY DEPARTMENT COURSE and DIFFERENTIAL DIAGNOSIS/MDM   Vitals:    Vitals:    02/23/24 1645 02/23/24 1649   BP:  137/68   Pulse:  89   Resp:  18   Temp:  97.9 F (36.6 C)   SpO2:  93%   Weight: 68 kg (150 lb)    Height: 1.575 m (5' 2)         Patient was given the following medications:  Medications - No data to display    Medical Decision Making  Amount and/or Complexity of Data Reviewed  Radiology: ordered.          Social Determinants affecting Dx or Tx: None    FINAL IMPRESSION     1. Closed head injury, initial encounter    2. Strain of neck muscle, initial encounter    3. Degeneration of intervertebral disc of lumbar region, unspecified whether pain present          DISPOSITION/PLAN   Sadako L Loe's  results have been reviewed with her.  She has been counseled regarding her diagnosis, treatment, and plan.  She verbally conveys understanding and agreement of the signs, symptoms, diagnosis, treatment and prognosis and additionally agrees to follow up as discussed.  She also agrees with the care-plan and conveys that all of her questions have been answered.  I have also provided discharge instructions for her that include: educational information regarding their diagnosis and treatment, and list of reasons why they would want to return to the ED prior to their follow-up appointment, should her condition change.     CLINICAL IMPRESSION    Discharge Note: The patient is stable for discharge home. The signs, symptoms, diagnosis, and discharge instructions have been discussed, understanding conveyed, and agreed upon. The patient is to follow up as recommended or return to ER should their symptoms worsen.      PATIENT REFERRED TO:  Kennie Darin MATSU, MD  [address] [place] [address]  [phone]      As  needed    [facility]  [address]  [place]  [address]  [phone]    If symptoms worsen       DISCHARGE MEDICATIONS:     Medication List        ASK your doctor about these medications      acetaminophen  500 MG tablet  Commonly known as: TYLENOL      albuterol  (2.5 MG/3ML) 0.083% nebulizer solution  Commonly known as: PROVENTIL   Take 3 mLs by nebulization 4 times daily as needed for Wheezing     ammonium lactate 12 % lotion  Commonly known as: LAC-HYDRIN     ascorbic acid 100 MG tablet  Commonly known as: VITAMIN C     cetirizine  5 MG tablet  Commonly known as: ZYRTEC   Take 1 tablet  by mouth daily     chlorhexidine 0.12 % solution  Commonly known as: PERIDEX     Debrox 6.5 % otic solution  Generic drug: carbamide peroxide     donepezil  5 MG tablet  Commonly known as: ARICEPT   TAKE 1 TABLET BY MOUTH DAILY BEFORE DINNER     DULoxetine  60 MG extended release capsule  Commonly known as: CYMBALTA   TAKE 1 CAPSULE BY MOUTH DAILY WITH BREAKFAST     fish oil 1000 MG Cpdr     predniSONE  20 MG tablet  Commonly known as: DELTASONE   Take 2 tablets by mouth daily for 5 days     pregabalin  200 MG capsule  Commonly known as: LYRICA   Take 1 capsule by mouth 2 times daily for 180 days. Max Daily Amount: 400 mg     rOPINIRole  0.25 MG tablet  Commonly known as: REQUIP   Take 1 tablet by mouth nightly     valsartan  160 MG tablet  Commonly known as: DIOVAN   Take 1 tablet by mouth daily     vitamin D 50 MCG (2000 UT) Caps capsule  Commonly known as: CHOLECALCIFEROL     vitamin E 1000 units capsule     Zinc 30 MG Caps                DISCONTINUED MEDICATIONS:  Current Discharge Medication List          I am the Primary Clinician of Record.   Marlan Sharps, MD (electronically signed)    (Please note that parts of this dictation were completed with voice recognition software. Quite often unanticipated grammatical, syntax, homophones, and other interpretive errors are inadvertently transcribed by the  computer software. Please disregards these errors. Please excuse any errors that have escaped final proofreading.)        Sharps Marlan, MD  02/23/24 2258

## 2024-02-23 NOTE — ED Notes (Signed)
 Patient to bathroom via wheelchair with 1 assist.

## 2024-02-25 NOTE — Telephone Encounter (Signed)
 [facility] S/N(21) [id number] order scanned in to [employer].    Requesting that Dr. Eugune writes a letter to [employer] to keep the [facility]. Bed, O2 machine and transport chair faxed to [facility]  [phone] or they will pick up equipment  tomorrow 02/26/2024

## 2024-02-26 NOTE — Telephone Encounter (Signed)
 Per Dr. Kennie to follow patient and sign Home health Orders.

## 2024-02-26 NOTE — Telephone Encounter (Signed)
 Tiwanda with [facility] wanted to know if Dr. Kennie would follow and sign home health orders. She can be reached at [phone].

## 2024-02-26 NOTE — Telephone Encounter (Signed)
 Sonny with [facility] wanted to report that the pt has come off of hospice and wanted to know if they could get prescriptions, office notes and a letter of medical necessity for a bed for the pt. She is wanting a call back at [phone] to further discuss this as she has a lot more that she is requesting.

## 2024-02-27 NOTE — Telephone Encounter (Signed)
 Sonny from [employer] [phone] requesting a prescription for hospital bed,  wheel chair, gel overlay and a bedside commode. A letter of medical necessity for everything except the gel overlay faxed to  [phone].

## 2024-02-28 NOTE — Telephone Encounter (Signed)
 Faxed to: Sonny from [employer] a prescription for hospital bed, wheel chair, gel overlay and a bedside commode. A letter of medical necessity for everything except the gel overlay faxed to [phone].

## 2024-02-28 NOTE — Telephone Encounter (Signed)
 Sonny said that the letter of medical necessity is not good and they are needing another one, she is also needing face to face notes from appointments with the pt helping them realize why she is needing all of the items. She can be reached at [phone].

## 2024-03-04 NOTE — Telephone Encounter (Signed)
 Faxed office notes to [facility] for review. Sonny will reach back out and send outline on what is specifically needed for [facility]. [phone]

## 2024-03-06 ENCOUNTER — Encounter

## 2024-03-06 NOTE — Telephone Encounter (Signed)
 Last office visit: 09/03/2023  Next office visit: 03/30/2024  Last med refill: 08/28/2023

## 2024-03-08 MED ORDER — DULOXETINE HCL 60 MG PO CPEP
60 | ORAL_CAPSULE | Freq: Every day | ORAL | 1 refills | 90.00000 days | Status: AC
Start: 2024-03-08 — End: ?

## 2024-03-08 MED ORDER — DULOXETINE HCL 30 MG PO CPEP
30 | ORAL_CAPSULE | Freq: Every day | ORAL | 3 refills | 90.00000 days | Status: DC
Start: 2024-03-08 — End: 2024-03-30

## 2024-03-09 NOTE — Telephone Encounter (Signed)
 Pt's niece, Angeline said that the pt's grandson has covid and they are wanting to know how long he is needing to stay away from the pt. Angeline can be reached at [phone].

## 2024-03-09 NOTE — Telephone Encounter (Signed)
 Returned a call to pts niece, Angeline,  informed to stay away from pt for at least 14 days or until test negative for covid, she stated understanding

## 2024-03-11 NOTE — Telephone Encounter (Signed)
 Attempted to contact patient regarding upcoming Medicare wellness appointment and completion of HRA questionnaire. No answer, no VM.

## 2024-03-12 MED ORDER — DOCUSATE SODIUM 100 MG PO CAPS
100 | ORAL_CAPSULE | Freq: Every day | ORAL | 5 refills | 30.00000 days | Status: AC
Start: 2024-03-12 — End: ?

## 2024-03-12 NOTE — Telephone Encounter (Signed)
 Last appointment: 02/20/24  Next appointment: 03/13/24  Previous refill encounter(s): 04/20/21    Requested Prescriptions     Pending Prescriptions Disp Refills    docusate sodium  (COLACE) 100 MG capsule 30 capsule 5     Sig: Take 1 capsule by mouth daily         For Pharmacy Admin Tracking Only    Program: Medication Refill  CPA in place:    Recommendation Provided To:   Intervention Detail: New Rx: 1, reason: Patient Preference  Intervention Accepted By:   Joesph Closed?:    Time Spent (min): 5

## 2024-03-13 ENCOUNTER — Ambulatory Visit: Admit: 2024-03-13 | Discharge: 2024-03-13 | Payer: MEDICARE | Attending: Family Medicine | Primary: Family Medicine

## 2024-03-13 VITALS — BP 142/84 | HR 91 | Temp 97.30000°F | Resp 16 | Ht 62.0 in | Wt 145.5 lb

## 2024-03-13 DIAGNOSIS — I10 Essential (primary) hypertension: Principal | ICD-10-CM

## 2024-03-13 MED ORDER — VALSARTAN 160 MG PO TABS
160 | ORAL_TABLET | Freq: Every day | ORAL | 3 refills | 90.00000 days | Status: DC
Start: 2024-03-13 — End: 2024-03-30

## 2024-03-13 NOTE — Progress Notes (Unsigned)
 Chief Complaint   Patient presents with    Medicare AWV       Accompanied By: Niece Joya)    Provider notified of Elevated BP readings.      Have you been to the ER, urgent care clinic since your last visit?  Hospitalized since your last visit?      Yes  02/23/2024 (5 hours)  [facility] Emergency Department  Closed head injury     "Have you seen or consulted any other health care providers outside of [facility] since your last visit?"    NO             Vitals:    03/13/24 1407 03/13/24 1414   BP: (!) 148/86 (!) 142/84   Pulse: 91    Resp: 16    Temp: 97.3 F (36.3 C)    TempSrc: Temporal    SpO2: 97%    Weight: 66 kg (145 lb 8.1 oz)    Height: 1.575 m (5' 2)       Health Maintenance Due   Topic Date Due    Shingles vaccine (1 of 2) Never done    Respiratory Syncytial Virus (RSV) Pregnant or age 38 yrs+ (1 - 1-dose 75+ series) Never done    Pneumococcal 50+ years Vaccine (2 of 2 - PCV) 08/19/2015    COVID-19 Vaccine (4 - 2024-25 season) 04/07/2023    Annual Wellness Visit (Medicare)  01/16/2024    Flu vaccine (1) 03/06/2024      The patient, Kimberly [name], identity was verified by name and DOB, pharmacy verified  Labs:Yes  Fasting:No

## 2024-03-13 NOTE — Progress Notes (Signed)
 [facility]  [facility]  [phone]  [place], [place] [address]  [phone]    Chief Complaint: Acute/chronic medical problems    Subjective  Kimberly [name] is a 89 y.o. Black / [place] American female , established patient, here for evaluation of the concern(s) above;    SUBJECTIVE:     Pt would like to be evaluated for the problem(s) listed below     PMHx: HTN, PVD, pituitary mirroadenoma S/P surgery (follows with Dr. Markel), DM with peripheral neuropathy, DDD , CKD 3a, Malignant neoplasm of left breast, Dementia with behavioral disturbance (follows with neuro), and HLD who presents for follow up of acute/chronic problems;     Present at visit: Kimberly [name] (Niece- primary caregiver).    Patient has some medical supplies obtained from hospice and they would like  a letter of medical necessity to keep the supplies.       Chronic DOE.    Per niece, patient does sleeps better with oxygen.   Hospital bed also helps to alleviate symptoms    Chronic back pain:  Uses a walker and also benefits from the hospital bed    Neuropathy/dementia:  On Lyrica  200mg  BID for neuropathy managed by neurology.  Completed home PT.  Uses a Rolator walker when needed.  Recently saw neuro (Dr. Claudene) for evaluation.    Restless leg - on Ropinirole .        Other Health Habits and social history:  Her only daughter passed away.     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     Review of systems:       A comprehensive review of systems was negative except for that written in the History of Present Illness.        Allergies - reviewed:   Allergies   Allergen Reactions    Penicillins Hives and Myalgia    Sulfa Antibiotics Hives     Other reaction(s): Unknown (comments)       Past Medical History - reviewed:  Past Medical History:   Diagnosis Date    Adverse effect of anesthesia     slow to wake     Anginal pain     Arthritis     Breast cancer (HCC) 03/10/2018     left lumpectomy    Cancer (HCC) 01/2018    breast    Chronic pain     neuropathy    Constipation     GERD (gastroesophageal reflux disease)     Headache     Hearing loss     High cholesterol     Hypertension     Incontinence     Joint pain     Memory disorder     Muscle pain     Osteoporosis     Ringing in the ears     Snoring     Visual disturbance        Depression screening:  PHQ-9 Total Score: 0 (03/13/2024  2:12 PM)      Review of systems:   A comprehensive review of systems was negative except for that written in the History of Present Illness.     Physical Exam  BP (!) 142/84   Pulse 91   Temp 97.3 F (36.3 C) (Temporal)   Resp 16   Ht 1.575 m (5' 2)   Wt 66 kg (145  lb 8.1 oz)   SpO2 97%   BMI 26.61 kg/m     General: Alert and oriented, in no acute distress.  SKIN: No rash. No suspicious lesions or moles.  EYE: PERRL. Sclera and conjuctival clear. Extraocular movements intact.  EARS: External normal, canals clear, tympanic membranes normal.   NOSE: Mucosa healthy without drainage or ulceration.  OROPHARYNX: No suspicious lesions, normal dentition, pharynx, tongue and tonsils normal.  NECK: Supple; no masses; thyroid normal.   LUNGS: Respirations unlabored; clear to auscultation bilaterally.  CARDIOVASCULAR: Regular, rate, and rhythm without murmurs, gallops or rubs.  ABDOMEN: Soft; nontender; nondistended; normoactive bowel sounds; no masses or organomegaly.  MUSCULOSKELETAL: ROM limited by back pain  EXT: No edema.   Neurological: uses a walker     Assessment/Plan  1. Lumbosacral radiculopathy due to degenerative joint disease of spine  2. Cervical radiculopathy due to degenerative joint disease of spine  3. Chronic shortness of breath  4. Primary hypertension  -     valsartan  (DIOVAN ) 160 MG tablet; Take 1 tablet by mouth daily, Disp-90 tablet, R-3NO PRINT  5. Diabetic peripheral neuropathy associated with type 2 diabetes mellitus  (HCC)       Based on the above conditions patient will benefit from the following;      1) Requires continuous at home supplemental oxygen    - Patient has chronic shortness of breath which significantly improves with oxygen.  - Oxygen Sats <88% at REST in the absence of oxygen.  - Tried alternate measures without success,  patient needs supplemental oxygen.    2) Walkers (1-3 must be met)   - Patient has mobility limitation that significantly impairs their ability to participate in most activities of daily living in the home such as standing to prepare meals, toileting, bathing etc.  - Patient is able to safely use walker   - Condition not resolved with use of a cane       3) Wheelchair: Patient needs a wheelchair for the following reasons;    - Patient has significant mobility impairment which limits his ability to independently use the toilet, dress, groom, bath or feed without assistance.  - Mobility deficit cannot be resolved by use of cane or walker given patient severe chronic back pain  - Use of the wheelchair will improve his ability to participate in ADLs  - Patient hasn't expressed unwillingness to use manual wheelchair AND  - Has a caregiver available, willing and able to assist with the wheelchair .    4) Hospital bed:    -The bed is needed as the patient has a medical condition which requires positioning of the bed in ways not feasible with an ordinary bed to alleviate pain.      5) Bedside commode    -The bedside commode is needed as patient is unable to get to restroom with assistance.     Follow up: 3 months or sooner. RTC to clinic sooner should symptoms persist, worsen or fail to improve as anticipated.    On this date 03/16/24 I have spent 25 minutes reviewing previous notes, test results and face to face with the patient discussing the diagnosis and importance of compliance with the treatment plan as well as documenting on the day of the visit.       We discussed the expected course, resolution  and complications of the diagnosis(es) in detail.  Medication risks, benefits, costs, interactions, and alternatives were discussed as indicated.  I advised to contact the office  if his condition worsens, changes or fails to improve as anticipated. Pt expressed understanding with the diagnosis(es) and plan. Patient understands that this encounter was a temporary measure, and the importance of further follow up and examination was emphasized.  Patient verbalized understanding.      Signed By: Darin Inetta Bumps, MD     March 16, 2024

## 2024-03-14 NOTE — Care Coordination-Inpatient (Signed)
 Ambulatory Care Coordination Note  03/13/2024       Patient Current Location:  [place]      ACM contacted the patient and caregiver by telephone. Verified name and DOB with caregiver as identifiers.         ACM: Tilton CHRISTELLA Bailey, RN     Challenges to be reviewed by the provider   Additional needs identified to be addressed with provider No                 Method of communication with provider: none.    Utilization: Has the patient been seen in the ED since your last call? no    Care Summary Note: Completed PCP follow up today. Patient complains of back discomfort which is a chronic complaint. Continue on Lyrica . According to niece, patient is mobile with walker. Uses her O2 to help with sleep. Has completed home PT. Need frequent re-directing due to dementia. 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 BP at goal.       Offered patient enrollment in the Remote Patient Monitoring (RPM) program for in-home monitoring: Yes, but did not enroll at this time: limited patient ability to navigate RPM/equipment.     Assessments Completed:       03/14/2024    12:02 AM   Amb Fall Risk Assessment and TUG Test   Do you feel unsteady or are you worried about falling?  yes   2 or more falls in past year? yes   Fall with injury in past year? yes    ,  ,   Hypertension - Encounter Level          ,   General Assessment    Do you have any symptoms that are causing concern?: No          Medications Reviewed:   Patient denies any changes with medications and reports taking all medications as prescribed.    Advance Care Planning:   Reviewed and current     Care Planning:   Education Documentation  Teach reporting changes in condition, taught by Bailey Tilton CHRISTELLA, RN at 03/14/2024 12:35 AM.  Learner: Caregiver  Readiness: Acceptance  Method: Demonstration  Response: Verbalizes Understanding    Discuss alcohol intake, taught by Bailey Tilton CHRISTELLA, RN at 03/14/2024 12:35 AM.  Learner:  Caregiver  Readiness: Acceptance  Method: Demonstration  Response: Verbalizes Understanding    Discuss information regarding benefits of regular exercise, taught by Bailey Tilton CHRISTELLA, RN at 03/14/2024 12:35 AM.  Learner: Caregiver  Readiness: Acceptance  Method: Demonstration  Response: Verbalizes Understanding    Discuss information about weight loss, taught by Bailey Tilton CHRISTELLA, RN at 03/14/2024 12:35 AM.  Learner: Caregiver  Readiness: Acceptance  Method: Demonstration  Response: Verbalizes Understanding    Teach appropriate dietary choices, taught by Bailey Tilton CHRISTELLA, RN at 03/14/2024 12:35 AM.  Learner: Caregiver  Readiness: Acceptance  Method: Demonstration  Response: Verbalizes Understanding    Discuss smoking cessation, taught by Bailey Tilton CHRISTELLA, RN at 03/14/2024 12:35 AM.  Learner: Caregiver  Readiness: Acceptance  Method: Demonstration  Response: Verbalizes Understanding    Discuss recommended lifestyle changes, taught by Bailey Tilton CHRISTELLA, RN at 03/14/2024 12:35 AM.  Learner: Caregiver  Readiness: Acceptance  Method: Demonstration  Response: Verbalizes Understanding    Teach compliance with prescribed medication regimen, taught by Bailey Tilton CHRISTELLA, RN at 03/14/2024 12:35 AM.  Learner: Caregiver  Readiness: Acceptance  Method: Demonstration  Response: Verbalizes Understanding    Discuss information regarding technique to measure blood pressure, taught by Arnaldo Tilton HERO, RN at 03/14/2024 12:35 AM.  Learner: Caregiver  Readiness: Acceptance  Method: Demonstration  Response: Verbalizes Understanding    Teach information regarding medications, taught by Arnaldo Tilton HERO, RN at 03/14/2024 12:35 AM.  Learner: Caregiver  Readiness: Acceptance  Method: Demonstration  Response: Verbalizes Understanding    Teach importance of blood pressure control, taught by Arnaldo Tilton HERO, RN at 03/14/2024 12:35 AM.  Learner: Caregiver  Readiness: Acceptance  Method: Demonstration  Response: Verbalizes Understanding    Educate  potential activities for participation, taught by Arnaldo Tilton HERO, RN at 03/14/2024 12:35 AM.  Learner: Caregiver  Readiness: Acceptance  Method: Demonstration  Response: Verbalizes Understanding    Educate energy conservation techniques, taught by Arnaldo Tilton HERO, RN at 03/14/2024 12:35 AM.  Learner: Caregiver  Readiness: Acceptance  Method: Demonstration  Response: Verbalizes Understanding    Educate activity level, taught by Arnaldo Tilton HERO, RN at 03/14/2024 12:35 AM.  Learner: Caregiver  Readiness: Acceptance  Method: Demonstration  Response: Verbalizes Understanding    Discuss history of factors that may impair activity tolerance, taught by Arnaldo Tilton HERO, RN at 03/14/2024 12:35 AM.  Learner: Caregiver  Readiness: Acceptance  Method: Demonstration  Response: Verbalizes Understanding    Discuss exercises to increase strength and endurance, taught by Arnaldo Tilton HERO, RN at 03/14/2024 12:35 AM.  Learner: Caregiver  Readiness: Acceptance  Method: Demonstration  Response: Verbalizes Understanding    Educate home safety, taught by Arnaldo Tilton HERO, RN at 03/14/2024 12:35 AM.  Learner: Caregiver  Readiness: Acceptance  Method: Demonstration  Response: Verbalizes Understanding    COGNITIVE : FALLS-RISK OF, taught by Arnaldo Tilton HERO, RN at 03/14/2024 12:35 AM.  Learner: Caregiver  Readiness: Acceptance  Method: Demonstration  Response: Verbalizes Understanding    COGNITIVE : FALLS-RISK OF, taught by Arnaldo Tilton HERO, RN at 03/14/2024 12:35 AM.  Learner: Caregiver  Readiness: Acceptance  Method: Demonstration  Response: Verbalizes Understanding    Educate safety precautions, taught by Arnaldo Tilton HERO, RN at 03/14/2024 12:35 AM.  Learner: Caregiver  Readiness: Acceptance  Method: Demonstration  Response: Verbalizes Understanding    Education Comments  No comments found.     ,    Goals Addressed                   This Visit's Progress     Conditions and Symptoms   No change     I will schedule office visits, as  directed by my provider.  I will keep my appointment or reschedule if I have to cancel.  I will notify my provider of any barriers to my plan of care.  I will follow my Zone Management tool to seek urgent or emergent care.  I will notify my provider of any symptoms that indicate a worsening of my condition.    Barriers: impairment:  physical:  and cognitive  Plan for overcoming my barriers: PT/OT, compliance  Confidence: 8/10  Anticipated Goal Completion Date: 05/16/24.    03/13/24  Completed home PT.  Uses a Rolator walker when needed.  Recently saw neuro (Dr. Claudene) for evaluation.  Completed PCP follow up on today               PCP/Specialist follow up:   Future Appointments         Provider Specialty Dept Phone    03/30/2024 1:40 PM Claudene Debby LABOR, MD Neurology  [phone]            Follow Up:   Plan for next ACM outreach in approximately 3 weeks to complete:  - CC Protocol assessments  - disease specific assessments  - SDOH assessments  - goal progression  - education .   Caregiver is agreeable to this plan.

## 2024-03-16 NOTE — Progress Notes (Signed)
 [facility] order #[id number] [phone]

## 2024-03-17 NOTE — Progress Notes (Signed)
 Faxed to: [facility]  [phone]

## 2024-03-19 NOTE — Telephone Encounter (Signed)
 Rhonda ( OT ) with [place] [facility] states that patient hospital bed and O2 tank need to be re ordered please call patient niece Rock @ [phone]

## 2024-03-25 NOTE — Telephone Encounter (Signed)
 Returned call to [facility] and she is not sure where original oxygen order came from or if patient has pulmonary specialist. She was advised to check with her other niece to see who supplied the original order and if unable to determine then schedule appointment with Dr Kennie to obtain information for portable oxygen.

## 2024-03-26 NOTE — Progress Notes (Signed)
 Faxed to: [facility] order #[id number]  [phone]

## 2024-03-30 ENCOUNTER — Encounter

## 2024-03-30 ENCOUNTER — Ambulatory Visit: Admit: 2024-03-30 | Payer: MEDICARE | Attending: Neurology | Primary: Family Medicine

## 2024-03-30 VITALS — BP 132/80 | HR 78 | Temp 97.50000°F | Resp 18 | Ht 62.0 in | Wt 145.2 lb

## 2024-03-30 DIAGNOSIS — G301 Alzheimer's disease with late onset: Principal | ICD-10-CM

## 2024-03-30 MED ORDER — DULOXETINE HCL 30 MG PO CPEP
30 | ORAL_CAPSULE | Freq: Every day | ORAL | 11 refills | Status: AC
Start: 2024-03-30 — End: ?

## 2024-03-30 MED ORDER — VALSARTAN 160 MG PO TABS
160 | ORAL_TABLET | Freq: Every day | ORAL | 3 refills | Status: AC
Start: 2024-03-30 — End: ?

## 2024-03-30 MED ORDER — DONEPEZIL HCL 10 MG PO TABS
10 | ORAL_TABLET | Freq: Every day | ORAL | 11 refills | Status: AC
Start: 2024-03-30 — End: ?
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
## 2024-03-30 NOTE — Telephone Encounter (Signed)
 Last appointment: 03/13/24  Next appointment: Advised to follow-up 06/13/24  Previous refill encounter(s): 07/02/23 #90 with 1 refill    Requested Prescriptions     Pending Prescriptions Disp Refills    valsartan  (DIOVAN ) 160 MG tablet 90 tablet 3     Sig: Take 1 tablet by mouth daily         For Pharmacy Admin Tracking Only    Program: Medication Refill  CPA in place:    Recommendation Provided To:   Intervention Detail: New Rx: 1, reason: Patient Preference  Intervention Accepted By:   Joesph Closed?:    Time Spent (min): 5

## 2024-04-01 NOTE — Care Coordination-Inpatient (Signed)
 Ambulatory Care Coordination Note     04/01/2024 5:44 PM     Patient Current Location:  [place]      ACM contacted the caregiver by telephone. Verified name and DOB with caregiver as identifiers.         ACM: Tilton CHRISTELLA Bailey, RN     Challenges to be reviewed by the provider   Additional needs identified to be addressed with provider No                 Method of communication with provider: none.    Utilization: Has the patient been seen in the ED since your last call? no    Care Summary Note: Completed Neuro follow up as was scheduled. She has severe foot drop and distal weakness in both legs and has to wear bilateral AFOs, possibly due to neuropathy or lumbar radiculopathy. Patient remain potential for falls. Encouraged use of assistive devices. Safety precautions discussed. Will discuss additional PT for mobility. Remain compliant with diet and medications.    Offered patient enrollment in the Remote Patient Monitoring (RPM) program for in-home monitoring: Yes, but did not enroll at this time: limited patient ability to navigate RPM/equipment.     Assessments Completed:       04/01/2024     5:20 PM   Amb Fall Risk Assessment and TUG Test   Do you feel unsteady or are you worried about falling?  yes   2 or more falls in past year? yes   Fall with injury in past year? yes    ,   Care Coordination Interventions    Referral from Primary Care Provider: No  Suggested Interventions and Community Resources  Fall Risk Prevention: In Process  Disease Specific Clinic: In Process  Disease Association: In Process  Specialty Services Referral: In Process  Other Therapy Services: In Process  Other Services: In Process  Zone Management Tools: In Process      ,   Hypertension - Encounter Level          ,   General Assessment    Do you have any symptoms that are causing concern?: No          Medications Reviewed:   Patient denies any changes with medications and reports taking all medications as prescribed.    Advance Care  Planning:   Reviewed and current     Care Planning:   Education Documentation  Teach reporting changes in condition, taught by Bailey Tilton CHRISTELLA, RN at 04/01/2024  5:25 PM.  Learner: Family  Readiness: Acceptance  Method: Explanation  Response: Verbalizes Understanding    Discuss alcohol intake, taught by Bailey Tilton CHRISTELLA, RN at 04/01/2024  5:25 PM.  Learner: Family  Readiness: Acceptance  Method: Explanation  Response: Verbalizes Understanding    Discuss information regarding benefits of regular exercise, taught by Bailey Tilton CHRISTELLA, RN at 04/01/2024  5:25 PM.  Learner: Family  Readiness: Acceptance  Method: Explanation  Response: Verbalizes Understanding    Discuss information about weight loss, taught by Bailey Tilton CHRISTELLA, RN at 04/01/2024  5:25 PM.  Learner: Family  Readiness: Acceptance  Method: Explanation  ResponseBETHA [place] Understanding    Teach appropriate dietary choices, taught by Bailey Tilton CHRISTELLA, RN at 04/01/2024  5:25 PM.  Learner: Family  Readiness: Acceptance  Method: Explanation  ResponseBETHA [place] Understanding    Discuss smoking cessation, taught by Bailey Tilton CHRISTELLA, RN at 04/01/2024  5:25 PM.  Learner: Family  Readiness: Acceptance  Method: Explanation  Response: Verbalizes Understanding    Discuss recommended lifestyle changes, taught by Arnaldo Tilton HERO, RN at 04/01/2024  5:25 PM.  Learner: Family  Readiness: Acceptance  Method: Explanation  Response: Verbalizes Understanding    Teach compliance with prescribed medication regimen, taught by Arnaldo Tilton HERO, RN at 04/01/2024  5:25 PM.  Learner: Family  Readiness: Acceptance  Method: Explanation  Response: Verbalizes Understanding    Discuss information regarding technique to measure blood pressure, taught by Arnaldo Tilton HERO, RN at 04/01/2024  5:25 PM.  Learner: Family  Readiness: Acceptance  Method: Explanation  ResponseBETHA [place] Understanding    Teach information regarding medications, taught by Arnaldo Tilton HERO, RN at  04/01/2024  5:25 PM.  Learner: Family  Readiness: Acceptance  Method: Explanation  Response: Verbalizes Understanding    Teach importance of blood pressure control, taught by Arnaldo Tilton HERO, RN at 04/01/2024  5:25 PM.  Learner: Family  Readiness: Acceptance  Method: Explanation  Response: Verbalizes Understanding    Educate potential activities for participation, taught by Arnaldo Tilton HERO, RN at 04/01/2024  5:25 PM.  Learner: Family  Readiness: Acceptance  Method: Explanation  Response: Verbalizes Understanding    Educate energy conservation techniques, taught by Arnaldo Tilton HERO, RN at 04/01/2024  5:25 PM.  Learner: Family  Readiness: Acceptance  Method: Explanation  Response: Verbalizes Understanding    Educate activity level, taught by Arnaldo Tilton HERO, RN at 04/01/2024  5:25 PM.  Learner: Family  Readiness: Acceptance  Method: Explanation  Response: Verbalizes Understanding    Discuss history of factors that may impair activity tolerance, taught by Arnaldo Tilton HERO, RN at 04/01/2024  5:25 PM.  Learner: Family  Readiness: Acceptance  Method: Explanation  ResponseBETHA [place] Understanding    Discuss exercises to increase strength and endurance, taught by Arnaldo Tilton HERO, RN at 04/01/2024  5:25 PM.  Learner: Family  Readiness: Acceptance  Method: Explanation  ResponseBETHA [place] Understanding    Educate home safety, taught by Arnaldo Tilton HERO, RN at 04/01/2024  5:22 PM.  Learner: Family  Readiness: Acceptance  Method: Explanation  Response: Verbalizes Understanding    COGNITIVE : FALLS-RISK OF, taught by Arnaldo Tilton HERO, RN at 04/01/2024  5:22 PM.  Learner: Family  Readiness: Acceptance  Method: Explanation  Response: Verbalizes Understanding    COGNITIVE : FALLS-RISK OF, taught by Arnaldo Tilton HERO, RN at 04/01/2024  5:22 PM.  Learner: Family  Readiness: Acceptance  Method: Explanation  Response: Verbalizes Understanding    Educate safety precautions, taught by Arnaldo Tilton HERO, RN at 04/01/2024   5:22 PM.  Learner: Family  Readiness: Acceptance  Method: Explanation  Response: Verbalizes Understanding    Education Comments  No comments found.     ,    Goals Addressed                   This Visit's Progress     Conditions and Symptoms   Improving     I will schedule office visits, as directed by my provider.  I will keep my appointment or reschedule if I have to cancel.  I will notify my provider of any barriers to my plan of care.  I will follow my Zone Management tool to seek urgent or emergent care.  I will notify my provider of any symptoms that indicate a worsening of my condition.    Barriers: impairment:  physical:  and cognitive  Plan for overcoming my barriers: PT/OT, compliance  Confidence: 8/10  Anticipated Goal Completion Date:  05/16/24.    03/13/24  Completed home PT.  Uses a Rolator walker when needed.  Recently saw neuro (Dr. Claudene) for evaluation.  Completed PCP follow up on today    04/01/24  No new falls  Rolator  to assist with mobility  Will keep all scheduled appointments  Will notify provider if changes occur               PCP/Specialist follow up:   Future Appointments         Provider Specialty Dept Phone    03/31/2025 2:00 PM Claudene Debby LABOR, MD Neurology [phone]            Follow Up:   Plan for next ACM outreach in approximately 2 weeks to complete:  - CC Protocol assessments  - disease specific assessments  - goal progression  - education .   Caregiver is agreeable to this plan.

## 2024-04-02 NOTE — Progress Notes (Signed)
 Faxed to [facility] order #[id number]  [phone]

## 2024-04-14 NOTE — Care Coordination-Inpatient (Signed)
 Ambulatory Care Coordination Note     04/14/2024    Patient Current Location:  [place]      ACM contacted the caregiver Angeline, by telephone. Verified name and DOB with caregiver as identifiers.         ACM: Tilton CHRISTELLA Bailey, RN     Challenges to be reviewed by the provider   Additional needs identified to be addressed with provider No                 Method of communication with provider: none.    Utilization: Has the patient been seen in the ED since your last call? no    Care Summary Note: Spoke with Angeline, who says patient is about the same. Had remained compliant with medications and diet. Last seen by neurology for falls. Has had none since seen. Using assistive devices as needed. Patient has remained active in the home, says she do not need therapy for now. According to niece, patient need frequent redirecting. Needs assistance with mobility due to her foot drop, which causes her to fall. Reviewed safety precautions and when to call for help.    Offered patient enrollment in the Remote Patient Monitoring (RPM) program for in-home monitoring: Yes, but did not enroll at this time: limited patient ability to navigate RPM/equipment.     Assessments Completed:       04/14/2024     8:17 PM   Amb Fall Risk Assessment and TUG Test   Do you feel unsteady or are you worried about falling?  yes   2 or more falls in past year? yes   Fall with injury in past year? yes    ,   Care Coordination Interventions    Referral from Primary Care Provider: No  Suggested Interventions and Community Resources  Fall Risk Prevention: In Process  Disease Specific Clinic: In Process  Disease Association: In Process  Specialty Services Referral: In Process  Other Therapy Services: In Process  Other Services: In Process  Zone Management Tools: In Process      ,   Hypertension - Encounter Level          ,   General Assessment    Do you have any symptoms that are causing concern?: No          Medications Reviewed:   Patient denies any changes  with medications and reports taking all medications as prescribed.    Advance Care Planning:   Reviewed and current     Care Planning:   Education Documentation  No documentation found.  Education Comments  No comments found.     ,    Goals Addressed                   This Visit's Progress     Conditions and Symptoms   No change     I will schedule office visits, as directed by my provider.  I will keep my appointment or reschedule if I have to cancel.  I will notify my provider of any barriers to my plan of care.  I will follow my Zone Management tool to seek urgent or emergent care.  I will notify my provider of any symptoms that indicate a worsening of my condition.    Barriers: impairment:  physical:  and cognitive  Plan for overcoming my barriers: PT/OT, compliance  Confidence: 8/10  Anticipated Goal Completion Date: 05/16/24.    03/13/24  Completed home PT.  Uses a Rolator walker  when needed.  Recently saw neuro (Dr. Claudene) for evaluation.  Completed PCP follow up on today    04/01/24  No new falls  Rolator  to assist with mobility  Will keep all scheduled appointments  Will notify provider if changes occur    04/14/24  Completed follow up as was scheduled                 PCP/Specialist follow up:   Future Appointments         Provider Specialty Dept Phone    03/31/2025 2:00 PM Claudene Debby LABOR, MD Neurology [phone]            Follow Up:   Plan for next ACM outreach in approximately 3 weeks to complete:  - CC Protocol assessments  - disease specific assessments  - goal progression.   Caregiver is agreeable to this plan.

## 2024-04-18 ENCOUNTER — Encounter

## 2024-04-20 MED ORDER — PREGABALIN 200 MG PO CAPS
200 | ORAL_CAPSULE | ORAL | 1 refills | 30.00000 days | Status: AC
Start: 2024-04-20 — End: 2024-10-17

## 2024-04-20 NOTE — Telephone Encounter (Signed)
 Last appointment: 03/13/24  Next appointment: Advised to follow-up 06/13/24  Previous refill encounter(s): 09/20/23 #60 with 5 refills    Requested Prescriptions     Pending Prescriptions Disp Refills    pregabalin  (LYRICA ) 200 MG capsule [Pharmacy Med Name: PREGABALIN  200MG  CAPSULES] 180 capsule 1     Sig: TAKE 1 CAPSULE BY MOUTH TWICE DAILY. MAX DAILY AMOUNT: 400 MG         For Pharmacy Admin Tracking Only    Program: Medication Refill  CPA in place:    Recommendation Provided To:   Intervention Detail: New Rx: 1, reason: Patient Preference  Intervention Accepted By:   Joesph Closed?:    Time Spent (min): 5

## 2024-04-22 NOTE — Telephone Encounter (Signed)
 Gweneth with [facility] wanted to report that the pt had a fall this past Sunday with no injuries and they are going to re-certify her for services. Tiwanda can be reached at [phone].

## 2024-04-30 NOTE — Telephone Encounter (Signed)
 Patient niece Angeline states that patient has a lot of wax build up in both ears please give her a call @ [phone]

## 2024-05-06 NOTE — Progress Notes (Signed)
 Faxed to: [facility] Order #[id number]  [phone]

## 2024-05-07 NOTE — Progress Notes (Signed)
 [facility]  order #[id number]  [phone]

## 2024-05-12 NOTE — Care Coordination (Signed)
"  Ambulatory Care Coordination Note     05/12/2024      Patient outreach attempt by this ACM today to perform care management follow up . ACM was unable to reach the caregiver, Angeline, by telephone today;   left voice message requesting a return phone call to this ACM.     ACM: Tilton CHRISTELLA Bailey, RN     PCP/Specialist follow up:   Future Appointments         Provider Specialty Dept Phone    03/31/2025 2:00 PM Claudene Debby LABOR, MD Neurology [phone]            Follow Up:   Plan for next ACM outreach in approximately 1 week to complete:  - CC Protocol assessments  - disease specific assessments  - goal progression  - education .             "

## 2024-05-15 DIAGNOSIS — J9811 Atelectasis: Secondary | ICD-10-CM

## 2024-05-15 DIAGNOSIS — J9601 Acute respiratory failure with hypoxia: Secondary | ICD-10-CM

## 2024-05-15 DIAGNOSIS — R0602 Shortness of breath: Secondary | ICD-10-CM

## 2024-05-15 NOTE — ED Provider Notes (Signed)
 "[facility]  EMERGENCY DEPARTMENT HISTORY AND PHYSICAL EXAM      Date of evaluation: 05/15/2024  [name] 09-Jul-1924  MRN: [id number]  ED Provider: Juliene Stands, MD   Note Started: 6:16 AM EDT 05/16/24    HISTORY OF PRESENT ILLNESS     Chief Complaint   Patient presents with    Shortness of Breath     BIBEMS from home with cc of SOB. PT's home health nurse called EMS due to the pt's sats being in the upper 80s all day. Pt's denies in pulmonary/cardiac hx. PT has PRN oxygen and albuterol  treatments at home. Is 89 on RA placing on 2L sating at 90 now. Denies CP,N/V but is SOB when she talks.Pt received Dueneb PTA.       History Provided By: Patient    HPI: Kimberly [name] is a 89 y.o. female presenting with SOB    Chief Complaint  Shortness of breath for one day    History of Present Illness  Kimberly [name] is a 89 year old female who presents with shortness of breath that has been ongoing for one day. She reports feeling short of breath today but cannot remember specifically when during the day this occurred. The patient has oxygen at home that she uses at night but not during the day. She denies any current pain and states she does not feel short of breath at the time of evaluation. The patient has no known history of lung disease such as asthma or COPD, and denies being febrile or sick recently. 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  Mild right arm deficits possibly from previous stroke in 2024.  Patient does not believe this is new.    Medical History  - Uses oxygen at night (no pulmonary or cardiac history documented)    Medications and Supplements  - Albuterol  PRN treatments    Social History  - Living Situation: Lives with niece  - Caregiving Resources: Has home health nurse    Review of Systems  Respiratory: Positive for shortness of breath today.  Musculoskeletal: Negative for pain.  PAST MEDICAL HISTORY   Past Medical History:  Past Medical History:   Diagnosis  Date    Acute CVA (cerebrovascular accident) (HCC) 09/12/2022    Adverse effect of anesthesia     slow to wake    Anginal pain     Arthritis     Breast cancer (HCC) 03/10/2018     left lumpectomy    Cancer (HCC) 01/2018    breast    Chronic pain     neuropathy    Constipation     GERD (gastroesophageal reflux disease)     Headache     Hearing loss     High cholesterol     Hypertension     Incontinence     Joint pain     Memory disorder     Muscle pain     Osteoporosis     Ringing in the ears     Snoring     Visual disturbance        Past Surgical History:  Past Surgical History:   Procedure Laterality Date    BREAST LUMPECTOMY Left 03/10/2018    LEFT BREAST LUMPECTOMY WITH ULTRASOUND GUIDANCE performed by MacDougall, Michael J, MD at [facility]    HYSTERECTOMY (CERVIX STATUS UNKNOWN)      ORTHOPEDIC SURGERY      epidural pain mtg -  pt unsure    OTHER SURGICAL HISTORY Right 10/15/2016    ligament correction right foot    OTHER SURGICAL HISTORY  11/26/2016    Pituitary tumor removal    US  BREAST BIOPSY W LOC DEVICE 1ST LESION LEFT Left 01/21/2018    US  BREAST NEEDLE BIOPSY LEFT 01/21/2018 [facility] US        Family History:  Family History   Problem Relation Age of Onset    Dementia Sister     Dementia Brother     Cancer Daughter     Breast Cancer Daughter 78    Breast Cancer Sister 41    Breast Cancer Mother 65    No Known Problems Father        Social History:  Social History     Tobacco Use    Smoking status: Never     Passive exposure: Never    Smokeless tobacco: Never   Vaping Use    Vaping status: Never Used   Substance Use Topics    Alcohol use: Not Currently    Drug use: No       Allergies:  Allergies   Allergen Reactions    Penicillins Hives and Myalgia    Sulfa Antibiotics Hives     Other reaction(s): Unknown (comments)       PCP: Kennie Darin MATSU, MD    Current Meds:   Current Facility-Administered Medications   Medication Dose Route Frequency Provider Last Rate Last Admin    ondansetron  (ZOFRAN -ODT)  disintegrating tablet 4 mg  4 mg Oral Q8H PRN Barry, Oumoul K, MD        haloperidol  lactate (HALDOL ) injection 2 mg  2 mg IntraVENous BID PRN Quin, Janae S, PA-C   2 mg at 05/17/24 2156    ondansetron  (ZOFRAN ) injection 4 mg  4 mg IntraVENous Q6H PRN Estelle Lynwood Real, DO        pregabalin  (LYRICA ) capsule 200 mg  200 mg Oral BID Le, Uyen V, MD   200 mg at 05/19/24 1002    sodium chloride  flush 0.9 % injection 5-40 mL  5-40 mL IntraVENous 2 times per day Le, Uyen V, MD   10 mL at 05/17/24 0855    0.9 % sodium chloride  infusion   IntraVENous PRN Le, Uyen V, MD        potassium chloride  (KLOR-CON  M) extended release tablet 40 mEq  40 mEq Oral PRN Le, Uyen V, MD        Or    potassium bicarb-citric acid  (EFFER-K) effervescent tablet 40 mEq  40 mEq Oral PRN Le, Uyen V, MD        Or    potassium chloride  10 mEq/100 mL IVPB (Peripheral Line)  10 mEq IntraVENous PRN Le, Uyen V, MD        magnesium  sulfate 2000 mg in 50 mL IVPB premix  2,000 mg IntraVENous PRN Le, Uyen V, MD        enoxaparin  (LOVENOX ) injection 40 mg  40 mg SubCUTAneous Daily Le, Uyen V, MD   40 mg at 05/19/24 1002    melatonin tablet 1.5 mg  1.5 mg Oral Nightly PRN Le, Uyen V, MD        polyethylene glycol (GLYCOLAX ) packet 17 g  17 g Oral Daily PRN Le, Uyen V, MD        acetaminophen  (TYLENOL ) tablet 650 mg  650 mg Oral Q6H PRN Le, Uyen V, MD   650 mg at 05/17/24 0104  Or    acetaminophen  (TYLENOL ) suppository 650 mg  650 mg Rectal Q6H PRN Le, Uyen V, MD        albuterol  (PROVENTIL ) (2.5 MG/3ML) 0.083% nebulizer solution 2.5 mg  2.5 mg Nebulization Q6H PRN Le, Uyen V, MD        cetirizine  (ZYRTEC ) tablet 5 mg  5 mg Oral Daily Le, Uyen V, MD   5 mg at 05/19/24 1002    donepezil  (ARICEPT ) tablet 10 mg  10 mg Oral Daily before dinner Le, Uyen V, MD   10 mg at 05/18/24 1610    DULoxetine  (CYMBALTA ) extended release capsule 30 mg  30 mg Oral Daily before dinner Le, Uyen V, MD   30 mg at 05/18/24 1610    DULoxetine  (CYMBALTA ) extended release capsule  60 mg  60 mg Oral Daily Le, Uyen V, MD   60 mg at 05/19/24 1002    rOPINIRole  (REQUIP ) tablet 0.25 mg  0.25 mg Oral Nightly Le, Uyen V, MD   0.25 mg at 05/18/24 2125    valsartan  (DIOVAN ) tablet 160 mg  160 mg Oral Daily Le, Uyen V, MD   160 mg at 05/19/24 1002    ipratropium 0.5 mg-albuterol  2.5 mg (DUONEB ) nebulizer solution 1 Dose  1 Dose Inhalation Q4H PRN Le, Uyen V, MD           Social Determinants of Health:   Social Drivers of Health     Tobacco Use: Low Risk  (03/30/2024)    Patient History     Smoking Tobacco Use: Never     Smokeless Tobacco Use: Never     Passive Exposure: Never   Alcohol Use: Not At Risk (05/16/2024)    AUDIT-C     Frequency of Alcohol Consumption: Never     Average Number of Drinks: Patient does not drink     Frequency of Binge Drinking: Never   Financial Resource Strain: Low Risk  (01/19/2023)    Overall Financial Resource Strain (CARDIA)     Difficulty of Paying Living Expenses: Not very hard   Food Insecurity: No Food Insecurity (05/16/2024)    Hunger Vital Sign     Worried About Running Out of Food in the Last Year: Never true     Ran Out of Food in the Last Year: Never true   Transportation Needs: No Transportation Needs (05/16/2024)    PRAPARE - Therapist, Art (Medical): No     Lack of Transportation (Non-Medical): No   Physical Activity: Inactive (03/13/2024)    Exercise Vital Sign     Days of Exercise per Week: 0 days     Minutes of Exercise per Session: 0 min   Stress: Stress Concern Present (01/19/2023)    [employer] of Occupational Health - Occupational Stress Questionnaire     Feeling of Stress : To some extent   Social Connections: Moderately Isolated (01/19/2023)    Social Connection and Isolation Panel     Frequency of Communication with Friends and Family: Three times a week     Frequency of Social Gatherings with Friends and Family: Once a week     Attends Religious Services: 1 to 4 times per year     Active Member of [employer] or Organizations:  No     Attends Banker Meetings: Never     Marital Status: Widowed   Intimate Partner Violence: Not At Risk (01/19/2023)    Humiliation, Afraid, Rape, and Kick questionnaire  Fear of Current or Ex-Partner: No     Emotionally Abused: No     Physically Abused: No     Sexually Abused: No   Depression: Not at risk (04/14/2024)    PHQ-2     PHQ-2 Score: 1   Housing Stability: Low Risk  (05/16/2024)    Housing Stability Vital Sign     Unable to Pay for Housing in the Last Year: No     Number of Times Moved in the Last Year: 0     Homeless in the Last Year: No   Interpersonal Safety: Not At Risk (05/16/2024)    Interpersonal Safety Domain Source: IP Abuse Screening     Physical abuse: Denies     Verbal abuse: Denies     Emotional abuse: Denies     Financial abuse: Denies     Sexual abuse: Denies   Utilities: Not At Risk (05/16/2024)    [employer] Utilities     Threatened with loss of utilities: No     PHYSICAL EXAM   Physical Exam  Vitals reviewed.   Constitutional:       Appearance: Normal appearance.   HENT:      Head: Normocephalic.      Ears:      Comments: normal     Nose: Nose normal.      Mouth/Throat:      Comments: normal  Eyes:      General: No scleral icterus.        Right eye: No discharge.         Left eye: No discharge.   Cardiovascular:      Rate and Rhythm: Normal rate and regular rhythm.   Pulmonary:      Effort: Pulmonary effort is normal. No respiratory distress.      Breath sounds: Normal breath sounds.   Abdominal:      General: There is no distension.      Palpations: Abdomen is soft. There is no mass.      Tenderness: There is no abdominal tenderness. There is no rebound.   Musculoskeletal:         General: No deformity. Normal range of motion.      Cervical back: Normal range of motion.   Skin:     General: Skin is warm and dry.   Neurological:      General: No focal deficit present.      Mental Status: She is alert.      Comments: Alert and oriented   Psychiatric:         Mood and Affect:  Mood normal.         Behavior: Behavior normal.         SCREENINGS     NIH Stroke Scale  NIH Stroke Scale Assessed: No          No data recorded       LAB, EKG AND DIAGNOSTIC RESULTS   Labs:  Results for orders placed or performed during the hospital encounter of 05/15/24   COVID-19 & Influenza Combo    Specimen: Nasopharynx   Result Value Ref Range    Source Nasopharyngeal      SARS-CoV-2, PCR Not detected NOTD      Rapid Influenza A By PCR Not detected NOTD      Rapid Influenza B By PCR Not detected NOTD     Respiratory Panel, Molecular, with COVID-19 (Restricted: peds pts or suitable admitted adults)    Specimen: Nasopharynx  Result Value Ref Range    Adenovirus by PCR Not detected NOTD      Coronavirus 229E by PCR Not detected NOTD      Coronavirus HKU1 by PCR Not detected NOTD      Coronavirus NL63 by PCR Not detected NOTD      Coronavirus OC43 by PCR Not detected NOTD      SARS-CoV-2, PCR Not detected NOTD      Human Metapneumovirus by PCR Not detected NOTD      Rhinovirus Enterovirus PCR Not detected NOTD      Influenza A by PCR Not detected NOTD      Influenza B PCR Not detected NOTD      Parainfluenza 1 PCR Not detected NOTD      Parainfluenza 2 PCR Not detected NOTD      Parainfluenza 3 PCR Not detected NOTD      Parainfluenza 4 PCR Not detected NOTD      Respiratory Syncytial Virus by PCR Not detected NOTD      Bordetella parapertussis by PCR Not detected NOTD      Bordetella pertussis by PCR Not detected NOTD      Chlamydophila Pneumonia PCR Not detected NOTD      Mycoplasma pneumo by PCR Not detected NOTD     BMP   Result Value Ref Range    Sodium 143 136 - 145 mmol/L    Potassium 4.4 3.5 - 5.1 mmol/L    Chloride 109 (H) 98 - 107 mmol/L    CO2 22 20 - 29 mmol/L    Anion Gap 12 2 - 14 mmol/L    Glucose 153 (H) 65 - 100 mg/dL    BUN 19 8 - 23 MG/DL    Creatinine 9.15 9.39 - 1.00 MG/DL    BUN/Creatinine Ratio 22 (H) 12 - 20      Est, Glom Filt Rate 62 >59 ml/min/1.66m2    Calcium  9.4 8.2 - 9.6 MG/DL   CBC  with Auto Differential   Result Value Ref Range    WBC 5.4 3.6 - 11.0 K/uL    RBC 5.24 (H) 3.80 - 5.20 M/uL    Hemoglobin 11.7 11.5 - 16.0 g/dL    Hematocrit 61.7 64.9 - 47.0 %    MCV 72.9 (L) 80.0 - 99.0 FL    MCH 22.3 (L) 26.0 - 34.0 PG    MCHC 30.6 30.0 - 36.5 g/dL    RDW 83.3 (H) 88.4 - 14.5 %    Platelets 270 150 - 400 K/uL    MPV 10.9 8.9 - 12.9 FL    Nucleated RBCs 0.0 0 PER 100 WBC    nRBC 0.00 0.00 - 0.01 K/uL    Neutrophils % 69.5 32.0 - 75.0 %    Lymphocytes % 16.3 12.0 - 49.0 %    Monocytes % 9.4 5.0 - 13.0 %    Eosinophils % 3.3 0.0 - 7.0 %    Basophils % 1.1 (H) 0.0 - 1.0 %    Immature Granulocytes % 0.4 0.0 - 0.5 %    Neutrophils Absolute 3.75 1.80 - 8.00 K/UL    Lymphocytes Absolute 0.88 0.80 - 3.50 K/UL    Monocytes Absolute 0.51 0.00 - 1.00 K/UL    Eosinophils Absolute 0.18 0.00 - 0.40 K/UL    Basophils Absolute 0.06 0.00 - 0.10 K/UL    Immature Granulocytes Absolute 0.02 0.00 - 0.04 K/UL    Differential Type AUTOMATED     Troponin  Result Value Ref Range    Troponin T 9.9 0 - 14 ng/L   Brain Natriuretic Peptide   Result Value Ref Range    NT Pro-BNP 65 0 - 450 PG/ML   Basic Metabolic Panel   Result Value Ref Range    Sodium 141 136 - 145 mmol/L    Potassium 5.6 (H) 3.5 - 5.1 mmol/L    Chloride 106 98 - 107 mmol/L    CO2 25 20 - 29 mmol/L    Anion Gap 10 2 - 14 mmol/L    Glucose 96 65 - 100 mg/dL    BUN 16 8 - 23 MG/DL    Creatinine 9.25 9.39 - 1.00 MG/DL    BUN/Creatinine Ratio 21 (H) 12 - 20      Est, Glom Filt Rate 73 >59 ml/min/1.70m2    Calcium  9.1 8.2 - 9.6 MG/DL   Procalcitonin   Result Value Ref Range    Procalcitonin 0.08 ng/mL   Basic Metabolic Panel w/ Reflex to MG   Result Value Ref Range    Sodium 139 136 - 145 mmol/L    Potassium 4.3 3.5 - 5.1 mmol/L    Chloride 107 98 - 107 mmol/L    CO2 22 20 - 29 mmol/L    Anion Gap 10 2 - 14 mmol/L    Glucose 99 65 - 100 mg/dL    BUN 16 8 - 23 MG/DL    Creatinine 9.17 9.39 - 1.00 MG/DL    BUN/Creatinine Ratio 19 12 - 20      Est, Glom Filt Rate  64 >59 ml/min/1.22m2    Calcium  9.1 8.2 - 9.6 MG/DL   CBC with Auto Differential   Result Value Ref Range    WBC 3.3 (L) 3.6 - 11.0 K/uL    RBC 4.83 3.80 - 5.20 M/uL    Hemoglobin 10.6 (L) 11.5 - 16.0 g/dL    Hematocrit 64.5 64.9 - 47.0 %    MCV 73.3 (L) 80.0 - 99.0 FL    MCH 21.9 (L) 26.0 - 34.0 PG    MCHC 29.9 (L) 30.0 - 36.5 g/dL    RDW 83.2 (H) 88.4 - 14.5 %    Platelets 241 150 - 400 K/uL    MPV 10.9 8.9 - 12.9 FL    Nucleated RBCs 0.0 0 PER 100 WBC    nRBC 0.00 0.00 - 0.01 K/uL    Neutrophils % 46.7 32.0 - 75.0 %    Lymphocytes % 27.4 12.0 - 49.0 %    Monocytes % 16.0 (H) 5.0 - 13.0 %    Eosinophils % 8.1 (H) 0.0 - 7.0 %    Basophils % 1.5 (H) 0.0 - 1.0 %    Immature Granulocytes % 0.3 0.0 - 0.5 %    Neutrophils Absolute 1.55 (L) 1.80 - 8.00 K/UL    Lymphocytes Absolute 0.91 0.80 - 3.50 K/UL    Monocytes Absolute 0.53 0.00 - 1.00 K/UL    Eosinophils Absolute 0.27 0.00 - 0.40 K/UL    Basophils Absolute 0.05 0.00 - 0.10 K/UL    Immature Granulocytes Absolute 0.01 0.00 - 0.04 K/UL    Differential Type AUTOMATED     Basic Metabolic Panel w/ Reflex to MG   Result Value Ref Range    Sodium 141 136 - 145 mmol/L    Potassium 4.5 3.5 - 5.1 mmol/L    Chloride 105 98 - 107 mmol/L    CO2 25 20 - 29 mmol/L  Anion Gap 11 2 - 14 mmol/L    Glucose 98 65 - 100 mg/dL    BUN 11 8 - 23 MG/DL    Creatinine 9.27 9.39 - 1.00 MG/DL    BUN/Creatinine Ratio 15 12 - 20      Est, Glom Filt Rate 76 >59 ml/min/1.82m2    Calcium  9.6 8.2 - 9.6 MG/DL   CBC with Auto Differential   Result Value Ref Range    WBC 5.1 3.6 - 11.0 K/uL    RBC 5.46 (H) 3.80 - 5.20 M/uL    Hemoglobin 12.2 11.5 - 16.0 g/dL    Hematocrit 60.7 64.9 - 47.0 %    MCV 71.8 (L) 80.0 - 99.0 FL    MCH 22.3 (L) 26.0 - 34.0 PG    MCHC 31.1 30.0 - 36.5 g/dL    RDW 83.6 (H) 88.4 - 14.5 %    Platelets 256 150 - 400 K/uL    MPV 10.6 8.9 - 12.9 FL    Nucleated RBCs 0.0 0 PER 100 WBC    nRBC 0.00 0.00 - 0.01 K/uL    Neutrophils % 63.1 32.0 - 75.0 %    Lymphocytes % 18.2 12.0 - 49.0  %    Monocytes % 13.1 (H) 5.0 - 13.0 %    Eosinophils % 4.2 0.0 - 7.0 %    Basophils % 1.2 (H) 0.0 - 1.0 %    Immature Granulocytes % 0.2 0.0 - 0.5 %    Neutrophils Absolute 3.19 1.80 - 8.00 K/UL    Lymphocytes Absolute 0.92 0.80 - 3.50 K/UL    Monocytes Absolute 0.66 0.00 - 1.00 K/UL    Eosinophils Absolute 0.21 0.00 - 0.40 K/UL    Basophils Absolute 0.06 0.00 - 0.10 K/UL    Immature Granulocytes Absolute 0.01 0.00 - 0.04 K/UL    Differential Type AUTOMATED     Basic Metabolic Panel w/ Reflex to MG   Result Value Ref Range    Sodium 139 136 - 145 mmol/L    Potassium 4.5 3.5 - 5.1 mmol/L    Chloride 105 98 - 107 mmol/L    CO2 23 20 - 29 mmol/L    Anion Gap 11 2 - 14 mmol/L    Glucose 85 65 - 100 mg/dL    BUN 13 8 - 23 MG/DL    Creatinine 9.17 9.39 - 1.00 MG/DL    BUN/Creatinine Ratio 15 12 - 20      Est, Glom Filt Rate 64 >59 ml/min/1.3m2    Calcium  9.2 8.2 - 9.6 MG/DL   CBC with Auto Differential   Result Value Ref Range    WBC 4.3 3.6 - 11.0 K/uL    RBC 5.10 3.80 - 5.20 M/uL    Hemoglobin 11.4 (L) 11.5 - 16.0 g/dL    Hematocrit 62.8 64.9 - 47.0 %    MCV 72.7 (L) 80.0 - 99.0 FL    MCH 22.4 (L) 26.0 - 34.0 PG    MCHC 30.7 30.0 - 36.5 g/dL    RDW 83.2 (H) 88.4 - 14.5 %    Platelets 264 150 - 400 K/uL    MPV 11.1 8.9 - 12.9 FL    Nucleated RBCs 0.0 0 PER 100 WBC    nRBC 0.00 0.00 - 0.01 K/uL    Neutrophils % 49.6 32.0 - 75.0 %    Lymphocytes % 26.6 12.0 - 49.0 %    Monocytes % 13.3 (H) 5.0 - 13.0 %    Eosinophils %  8.6 (H) 0.0 - 7.0 %    Basophils % 1.4 (H) 0.0 - 1.0 %    Immature Granulocytes % 0.5 0.0 - 0.5 %    Neutrophils Absolute 2.13 1.80 - 8.00 K/UL    Lymphocytes Absolute 1.14 0.80 - 3.50 K/UL    Monocytes Absolute 0.57 0.00 - 1.00 K/UL    Eosinophils Absolute 0.37 0.00 - 0.40 K/UL    Basophils Absolute 0.06 0.00 - 0.10 K/UL    Immature Granulocytes Absolute 0.02 0.00 - 0.04 K/UL    Differential Type AUTOMATED     EKG 12 Lead   Result Value Ref Range    Ventricular Rate 86 BPM    Atrial Rate 86 BPM    P-R  Interval 136 ms    QRS Duration 86 ms    Q-T Interval 394 ms    QTc Calculation (Bazett) 471 ms    P Axis 39 degrees    R Axis -16 degrees    T Axis 105 degrees    Diagnosis       Normal sinus rhythm  Left ventricular hypertrophy with repolarization abnormality  When compared with ECG of 21-Feb-2024 17:57,  No significant change was found         EKG:.  EKG shows normal sinus rhythm, heart rate 86, normal axis, no STEMI  EKGs are independently interpreted by Dr. Juliene Stands (ER physician)   Please see above in lab results immediately above the section for EKG interpretation, if EKG was performed.     Radiologic Studies:  Radiographic images are visualized and preliminarily, independently interpreted by the ED Provider with the following findings: Please see ED course for radiology studies interpretations    Interpretation per the Radiologist below, if available at the time of this note:  CTA CHEST W WO CONTRAST PE Eval   Final Result   Bilateral lower lobe atelectasis. Subpleural fibrotic changes left upper lobe   potentially treatment related. No evidence of pulmonary embolism.         Electronically signed by DARICE COLON           Records Reviewed: Non ED medical records reviewed including: Past neurology outpatient clinic visit with Dr. Claudene 03/30/2024 treatment for Alzheimer's and other medical conditions    MEDICAL DECISION MAKING and ED COURSE   11:43 AM Differential and Considerations of tests not ordered: Patient presents with Shortness of Breath. Will get CBC to evaluate for anemia or bacterial infection, CMP to evaluate for renal/hepatic failure, Troponin to evaluate for ACS, BNP to evaluate fluid status, CXR to evaluate for PNA/effusion/edema and EKG to evaluate for ACS and tachycardia. Considered Ddimer/ CTA Chest but low suspicion for PE at this time.       Lab work reviewed and interpreted independently by ER physician: COVID and flu negative, CBC normal, troponin negative, proBNP negative, mild  potassium increase but likely hemolyzed    Imaging studies reviewed and interpreted independently by ER physician: CT scan shows no PE and no pneumonia but evidence of fibrotic changes    Independent historian: EMS provides history of vitals and meds administered en route: None    New consult: Internal Medicine. We discussed patient's medical condition and need for admission for further treatment and monitoring    Risk: Prescription medication management, Prescription IV medications given in the ER, New serious or life-threatening medical condition diagnosed in the ER, Acute on chronic exacerbation of medical condition, and Considered admission or transfer to higher level of care given  patient's medical condition    Cardiac Monitor: Cardiac Monitor:  Rate: 88 bpm  The cardiac monitor revealed the following rhythm as interpreted by me: Normal Sinus Rhythm Cardiac and pulse ox monitoring were ordered to monitor patient for signs of cardiac dysrhythmia, which they are at risk for based on their history and/or risk for cardiovascular disease and/or metabolic abnormalities.  Pulse ox monitoring: normal oxygenation, reading 95   Independently interpreted by ER physician      Social Determinants affecting Treatment Plan: Patient has a poor medical literacy and understanding. Additional time provided and explanation of patient's diagnosis and treatment plan and These social determinants of health affect patient's ability to receive and obtain medical care and have impacted their medical visit and treatment today.     Vitals:    Vitals:    05/18/24 1000 05/18/24 1942 05/19/24 0245 05/19/24 0753   BP: (!) 170/86 130/71  124/67   Pulse: 88 88  69   Resp:  20 20 14    Temp: 98.4 F (36.9 C) 98.8 F (37.1 C)  98.2 F (36.8 C)   TempSrc: Oral Oral  Oral   SpO2: 95% 96%  97%   Weight:       Height:           ED COURSE  ED Course as of 05/19/24 1143   Sat May 16, 2024   0034 EKG shows normal sinus rhythm, normal rate, heart rate  86, no STEMI    EKG Independently interpreted by ER attending, Dr Juliene Stands [AH]      ED Course User Index  [AH] Sevin Farone, MD       Clinical Management Tools:  Not Applicable    Smoking Cessation: Not Applicable    Patient was given the following medications:  Medications   ondansetron  (ZOFRAN ) injection 4 mg (has no administration in time range)   pregabalin  (LYRICA ) capsule 200 mg (200 mg Oral Given 05/19/24 1002)   sodium chloride  flush 0.9 % injection 5-40 mL (0 mLs IntraVENous Held 05/19/24 1003)   0.9 % sodium chloride  infusion (has no administration in time range)   potassium chloride  (KLOR-CON  M) extended release tablet 40 mEq (has no administration in time range)     Or   potassium bicarb-citric acid  (EFFER-K) effervescent tablet 40 mEq (has no administration in time range)     Or   potassium chloride  10 mEq/100 mL IVPB (Peripheral Line) (has no administration in time range)   magnesium  sulfate 2000 mg in 50 mL IVPB premix (has no administration in time range)   enoxaparin  (LOVENOX ) injection 40 mg (40 mg SubCUTAneous Given 05/19/24 1002)   melatonin tablet 1.5 mg (has no administration in time range)   polyethylene glycol (GLYCOLAX ) packet 17 g (has no administration in time range)   acetaminophen  (TYLENOL ) tablet 650 mg (650 mg Oral Given 05/17/24 0104)     Or   acetaminophen  (TYLENOL ) suppository 650 mg ( Rectal See Alternative 05/17/24 0104)   albuterol  (PROVENTIL ) (2.5 MG/3ML) 0.083% nebulizer solution 2.5 mg (has no administration in time range)   cetirizine  (ZYRTEC ) tablet 5 mg (5 mg Oral Given 05/19/24 1002)   donepezil  (ARICEPT ) tablet 10 mg (10 mg Oral Given 05/18/24 1610)   DULoxetine  (CYMBALTA ) extended release capsule 30 mg (30 mg Oral Given 05/18/24 1610)   DULoxetine  (CYMBALTA ) extended release capsule 60 mg (60 mg Oral Given 05/19/24 1002)   rOPINIRole  (REQUIP ) tablet 0.25 mg (0.25 mg Oral Given 05/18/24 2125)   valsartan  (  DIOVAN ) tablet 160 mg (160 mg Oral Given 05/19/24 1002)    ipratropium 0.5 mg-albuterol  2.5 mg (DUONEB ) nebulizer solution 1 Dose (has no administration in time range)   haloperidol  lactate (HALDOL ) injection 2 mg (2 mg IntraVENous Given 05/17/24 2156)   ondansetron  (ZOFRAN -ODT) disintegrating tablet 4 mg (has no administration in time range)   ipratropium 0.5 mg-albuterol  2.5 mg (DUONEB ) nebulizer solution 1 Dose (1 Dose Inhalation Given 05/15/24 2335)   iopamidol  (ISOVUE -370) 76 % injection 100 mL (100 mLs IntraVENous Given 05/16/24 0053)       CONSULTS: See ED Course/MDM for further details.  IP CONSULT TO SOCIAL WORK  IP CONSULT TO PULMONOLOGY   PROCEDURES   Unless otherwise noted above, none  Procedures    SEPSIS REASSESSMENT & CRITICAL CARE TIME   SEPSIS REASSESSMENT: No suspicion of bacterial infection and not having 2 SIRS during this visit.    Critical Care Time: There was a high probability of life-threatening clinical deterioration in the patient's condition requiring my urgent intervention.  I personally saw the patient and independently provided 32 minutes of non-concurrent critical care out of the total shared critical care time provided, excluding separately reportable procedures.   CLINICAL IMPRESSIONS     1. Shortness of breath    2. COPD exacerbation (HCC)       SDOH/DISPOSITION/PLAN     DISPOSITION Admitted 05/16/2024 07:17:54 AM             Admit Note: Pt is being admitted by Hospitalist . The results of their tests and reason(s) for their admission have been discussed with pt and/or available family. They convey agreement and understanding for the need to be admitted and for the admission diagnosis.     PATIENT REFERRED TO:  No follow-up provider specified.      DISCHARGE MEDICATIONS:     Medication List        ASK your doctor about these medications      acetaminophen  500 MG tablet  Commonly known as: TYLENOL      albuterol  (2.5 MG/3ML) 0.083% nebulizer solution  Commonly known as: PROVENTIL   Take 3 mLs by nebulization 4 times daily as needed for  Wheezing     ammonium lactate 12 % lotion  Commonly known as: LAC-HYDRIN     ascorbic acid  100 MG tablet  Commonly known as: VITAMIN C      cetirizine  5 MG tablet  Commonly known as: ZYRTEC   Take 1 tablet by mouth daily     chlorhexidine 0.12 % solution  Commonly known as: PERIDEX     Debrox 6.5 % otic solution  Generic drug: carbamide peroxide     docusate sodium  100 MG capsule  Commonly known as: Colace  Take 1 capsule by mouth daily     donepezil  10 MG tablet  Commonly known as: Aricept   Take 1 tablet by mouth daily (before dinner)     * DULoxetine  60 MG extended release capsule  Commonly known as: CYMBALTA   Take 1 capsule by mouth daily     * DULoxetine  30 MG extended release capsule  Commonly known as: Cymbalta   Take 1 capsule by mouth daily (before dinner)     fish oil 1000 MG Cpdr     pregabalin  200 MG capsule  Commonly known as: LYRICA   TAKE 1 CAPSULE BY MOUTH TWICE DAILY. MAX DAILY AMOUNT: 400 MG     rOPINIRole  0.25 MG tablet  Commonly known as: REQUIP   Take 1 tablet by mouth nightly  valsartan  160 MG tablet  Commonly known as: DIOVAN   Take 1 tablet by mouth daily     vitamin D 50 MCG (2000 UT) Caps capsule  Commonly known as: CHOLECALCIFEROL     vitamin E  1000 units capsule     Zinc 30 MG Caps           * This list has 2 medication(s) that are the same as other medications prescribed for you. Read the directions carefully, and ask your doctor or other care provider to review them with you.                    DISCONTINUED MEDICATIONS:  Current Discharge Medication List          I am the Primary Clinician of Record. Juliene Stands, MD (electronically signed)    (Please note that parts of this dictation were completed with voice recognition software. Quite often unanticipated grammatical, syntax, homophones, and other interpretive errors are inadvertently transcribed by the computer software. Please disregards these errors. Please excuse any errors that have escaped final proofreading.)        Consandra Laske,  Caston Coopersmith, MD  05/19/24 1144    "

## 2024-05-16 ENCOUNTER — Inpatient Hospital Stay
Admission: EM | Admit: 2024-05-16 | Discharge: 2024-05-20 | Disposition: A | Discharge status: Home / Self Care | Payer: MEDICARE | Admitting: Internal Medicine

## 2024-05-16 ENCOUNTER — Emergency Department: Admit: 2024-05-16 | Payer: MEDICARE | Primary: Family Medicine

## 2024-05-16 DIAGNOSIS — J9601 Acute respiratory failure with hypoxia: Principal | ICD-10-CM

## 2024-05-16 LAB — CBC WITH AUTO DIFFERENTIAL
Basophils %: 1.1 % — ABNORMAL HIGH (ref 0.0–1.0)
Basophils Absolute: 0.06 K/UL (ref 0.00–0.10)
Eosinophils %: 3.3 % (ref 0.0–7.0)
Eosinophils Absolute: 0.18 K/UL (ref 0.00–0.40)
Hematocrit: 38.2 % (ref 35.0–47.0)
Hemoglobin: 11.7 g/dL (ref 11.5–16.0)
Immature Granulocytes %: 0.4 % (ref 0.0–0.5)
Immature Granulocytes Absolute: 0.02 K/UL (ref 0.00–0.04)
Lymphocytes %: 16.3 % (ref 12.0–49.0)
Lymphocytes Absolute: 0.88 K/UL (ref 0.80–3.50)
MCH: 22.3 pg — ABNORMAL LOW (ref 26.0–34.0)
MCHC: 30.6 g/dL (ref 30.0–36.5)
MCV: 72.9 FL — ABNORMAL LOW (ref 80.0–99.0)
MPV: 10.9 FL (ref 8.9–12.9)
Monocytes %: 9.4 % (ref 5.0–13.0)
Monocytes Absolute: 0.51 K/UL (ref 0.00–1.00)
Neutrophils %: 69.5 % (ref 32.0–75.0)
Neutrophils Absolute: 3.75 K/UL (ref 1.80–8.00)
Nucleated RBCs: 0 /100{WBCs}
Platelets: 270 K/uL (ref 150–400)
RBC: 5.24 M/uL — ABNORMAL HIGH (ref 3.80–5.20)
RDW: 16.6 % — ABNORMAL HIGH (ref 11.5–14.5)
WBC: 5.4 K/uL (ref 3.6–11.0)
nRBC: 0 K/uL (ref 0.00–0.01)

## 2024-05-16 LAB — BASIC METABOLIC PANEL
Anion Gap: 12 mmol/L (ref 2–14)
Anion Gap: 10 mmol/L (ref 2–14)
BUN/Creatinine Ratio: 22 — ABNORMAL HIGH (ref 12–20)
BUN/Creatinine Ratio: 21 — ABNORMAL HIGH (ref 12–20)
BUN: 19 mg/dL (ref 8–23)
BUN: 16 mg/dL (ref 8–23)
CO2: 22 mmol/L (ref 20–29)
CO2: 25 mmol/L (ref 20–29)
Calcium: 9.4 mg/dL (ref 8.2–9.6)
Calcium: 9.1 mg/dL (ref 8.2–9.6)
Chloride: 109 mmol/L — ABNORMAL HIGH (ref 98–107)
Chloride: 106 mmol/L (ref 98–107)
Creatinine: 0.84 mg/dL (ref 0.60–1.00)
Creatinine: 0.74 mg/dL (ref 0.60–1.00)
Est, Glom Filt Rate: 62 ml/min/1.73m2 (ref >59)
Est, Glom Filt Rate: 73 ml/min/1.73m2 (ref >59)
Glucose: 153 mg/dL — ABNORMAL HIGH (ref 65–100)
Glucose: 96 mg/dL (ref 65–100)
Potassium: 4.4 mmol/L (ref 3.5–5.1)
Potassium: 5.6 mmol/L — ABNORMAL HIGH (ref 3.5–5.1)
Sodium: 143 mmol/L (ref 136–145)
Sodium: 141 mmol/L (ref 136–145)

## 2024-05-16 LAB — RESPIRATORY PANEL, MOLECULAR, WITH COVID-19

## 2024-05-16 LAB — TROPONIN: Troponin T: 9.9 ng/L (ref 0–14)

## 2024-05-16 LAB — COVID-19 & INFLUENZA COMBO
Rapid Influenza A By PCR: NOT DETECTED
Rapid Influenza B By PCR: NOT DETECTED
SARS-CoV-2, PCR: NOT DETECTED

## 2024-05-16 LAB — PROCALCITONIN: Procalcitonin: 0.08 ng/mL

## 2024-05-16 LAB — BRAIN NATRIURETIC PEPTIDE: NT Pro-BNP: 65 pg/mL (ref 0–450)

## 2024-05-16 MED ORDER — ONDANSETRON HCL 4 MG/2ML IJ SOLN
4 | Freq: Four times a day (QID) | INTRAMUSCULAR | Status: DC | PRN
Start: 2024-05-16 — End: 2024-05-18

## 2024-05-16 MED ORDER — POTASSIUM CHLORIDE CRYS ER 20 MEQ PO TBCR
20 | ORAL | Status: DC | PRN
Start: 2024-05-16 — End: 2024-05-20

## 2024-05-16 MED ORDER — ENOXAPARIN SODIUM 40 MG/0.4ML IJ SOSY
40 | Freq: Every day | INTRAMUSCULAR | Status: DC
Start: 2024-05-16 — End: 2024-05-20
  Administered 2024-05-16 – 2024-05-20 (×5): 40 mg via SUBCUTANEOUS

## 2024-05-16 MED ORDER — IOPAMIDOL 76 % IV SOLN
76 | Freq: Once | INTRAVENOUS | Status: AC | PRN
Start: 2024-05-16 — End: 2024-05-16
  Administered 2024-05-16: 05:00:00 100 mL via INTRAVENOUS

## 2024-05-16 MED ORDER — VALSARTAN 160 MG PO TABS
160 | Freq: Every day | ORAL | Status: DC
Start: 2024-05-16 — End: 2024-05-20
  Administered 2024-05-16 – 2024-05-20 (×5): 160 mg via ORAL

## 2024-05-16 MED ORDER — MELATONIN 3 MG PO TABS
3 | Freq: Every evening | ORAL | Status: DC | PRN
Start: 2024-05-16 — End: 2024-05-20

## 2024-05-16 MED ORDER — DULOXETINE HCL 30 MG PO CPEP
30 | Freq: Every day | ORAL | Status: DC
Start: 2024-05-16 — End: 2024-05-20
  Administered 2024-05-16 – 2024-05-19 (×4): 30 mg via ORAL

## 2024-05-16 MED ORDER — ROPINIROLE HCL 0.25 MG PO TABS
0.25 | Freq: Every evening | ORAL | Status: DC
Start: 2024-05-16 — End: 2024-05-20
  Administered 2024-05-17 – 2024-05-20 (×3): 0.25 mg via ORAL

## 2024-05-16 MED ORDER — IPRATROPIUM-ALBUTEROL 0.5-2.5 (3) MG/3ML IN SOLN
0.5-2.5 | RESPIRATORY_TRACT | Status: DC | PRN
Start: 2024-05-16 — End: 2024-05-16

## 2024-05-16 MED ORDER — ONDANSETRON 4 MG PO TBDP
4 | Freq: Three times a day (TID) | ORAL | Status: DC | PRN
Start: 2024-05-16 — End: 2024-05-18

## 2024-05-16 MED ORDER — PREGABALIN 100 MG PO CAPS
100 | Freq: Two times a day (BID) | ORAL | Status: DC
Start: 2024-05-16 — End: 2024-05-20
  Administered 2024-05-16 – 2024-05-20 (×8): 200 mg via ORAL

## 2024-05-16 MED ORDER — ONDANSETRON HCL 4 MG/2ML IJ SOLN
4 | Freq: Four times a day (QID) | INTRAMUSCULAR | Status: DC | PRN
Start: 2024-05-16 — End: 2024-05-20

## 2024-05-16 MED ORDER — POTASSIUM BICARB-CITRIC ACID 20 MEQ PO TBEF
20 | ORAL | Status: DC | PRN
Start: 2024-05-16 — End: 2024-05-20

## 2024-05-16 MED ORDER — SODIUM ZIRCONIUM CYCLOSILICATE 5 G PO PACK
5 | Freq: Every day | ORAL | Status: DC
Start: 2024-05-16 — End: 2024-05-17
  Administered 2024-05-16: 21:00:00 5 g via ORAL

## 2024-05-16 MED ORDER — IPRATROPIUM-ALBUTEROL 0.5-2.5 (3) MG/3ML IN SOLN
0.5-2.5 | RESPIRATORY_TRACT | Status: AC
Start: 2024-05-16 — End: 2024-05-15
  Administered 2024-05-16: 04:00:00 1 via RESPIRATORY_TRACT

## 2024-05-16 MED ORDER — CETIRIZINE HCL 10 MG PO TABS
10 | Freq: Every day | ORAL | Status: DC
Start: 2024-05-16 — End: 2024-05-20
  Administered 2024-05-16 – 2024-05-20 (×5): 5 mg via ORAL

## 2024-05-16 MED ORDER — NORMAL SALINE FLUSH 0.9 % IV SOLN
0.9 | Freq: Two times a day (BID) | INTRAVENOUS | Status: DC
Start: 2024-05-16 — End: 2024-05-20
  Administered 2024-05-16 – 2024-05-17 (×3): 10 mL via INTRAVENOUS

## 2024-05-16 MED ORDER — DONEPEZIL HCL 5 MG PO TABS
5 | Freq: Every day | ORAL | Status: DC
Start: 2024-05-16 — End: 2024-05-20
  Administered 2024-05-16 – 2024-05-19 (×4): 10 mg via ORAL

## 2024-05-16 MED ORDER — POLYETHYLENE GLYCOL 3350 17 G PO PACK
17 | Freq: Every day | ORAL | Status: DC | PRN
Start: 2024-05-16 — End: 2024-05-20

## 2024-05-16 MED ORDER — ACETAMINOPHEN 325 MG PO TABS
325 | Freq: Four times a day (QID) | ORAL | Status: DC | PRN
Start: 2024-05-16 — End: 2024-05-20
  Administered 2024-05-17 – 2024-05-20 (×3): 650 mg via ORAL

## 2024-05-16 MED ORDER — MAGNESIUM SULFATE 2000 MG/50 ML IVPB PREMIX
2 | INTRAVENOUS | Status: DC | PRN
Start: 2024-05-16 — End: 2024-05-20

## 2024-05-16 MED ORDER — IPRATROPIUM-ALBUTEROL 0.5-2.5 (3) MG/3ML IN SOLN
0.5-2.5 | RESPIRATORY_TRACT | Status: DC | PRN
Start: 2024-05-16 — End: 2024-05-20

## 2024-05-16 MED ORDER — ALBUTEROL SULFATE (2.5 MG/3ML) 0.083% IN NEBU
Freq: Four times a day (QID) | RESPIRATORY_TRACT | Status: DC | PRN
Start: 2024-05-16 — End: 2024-05-20

## 2024-05-16 MED ORDER — VASOPRESSIN 20 UNIT/ML IV SOLN
20 | INTRAVENOUS | Status: DC
Start: 2024-05-16 — End: 2024-05-16

## 2024-05-16 MED ORDER — POTASSIUM CHLORIDE 10 MEQ/100ML IV SOLN
10 | INTRAVENOUS | Status: DC | PRN
Start: 2024-05-16 — End: 2024-05-20

## 2024-05-16 MED ORDER — ACETAMINOPHEN 650 MG RE SUPP
650 | Freq: Four times a day (QID) | RECTAL | Status: DC | PRN
Start: 2024-05-16 — End: 2024-05-20

## 2024-05-16 MED ORDER — SODIUM CHLORIDE 0.9 % IV SOLN
0.9 | INTRAVENOUS | Status: DC | PRN
Start: 2024-05-16 — End: 2024-05-20

## 2024-05-16 MED ORDER — ACETAMINOPHEN 325 MG PO TABS
325 | ORAL | Status: DC | PRN
Start: 2024-05-16 — End: 2024-05-16

## 2024-05-16 MED ORDER — DULOXETINE HCL 30 MG PO CPEP
30 | Freq: Every day | ORAL | Status: DC
Start: 2024-05-16 — End: 2024-05-20
  Administered 2024-05-16 – 2024-05-20 (×5): 60 mg via ORAL

## 2024-05-16 MED FILL — DULOXETINE HCL 30 MG PO CPEP: 30 mg | ORAL | Qty: 2 | Fill #0

## 2024-05-16 MED FILL — VASOSTRICT 20 UNIT/ML IV SOLN: 20 [IU]/mL | INTRAVENOUS | Qty: 1 | Fill #0

## 2024-05-16 MED FILL — CETIRIZINE HCL 10 MG PO TABS: 10 mg | ORAL | Qty: 1 | Fill #0

## 2024-05-16 MED FILL — DULOXETINE HCL 30 MG PO CPEP: 30 mg | ORAL | Qty: 1 | Fill #0

## 2024-05-16 MED FILL — ARICEPT 5 MG PO TABS: 5 mg | ORAL | Qty: 2 | Fill #0

## 2024-05-16 MED FILL — LOVENOX 40 MG/0.4ML IJ SOSY: 40 MG/0.4ML | INTRAMUSCULAR | Qty: 0.4 | Fill #0

## 2024-05-16 MED FILL — LOKELMA 5 G PO PACK: 5 g | ORAL | Qty: 1 | Fill #0

## 2024-05-16 MED FILL — IPRATROPIUM-ALBUTEROL 0.5-2.5 (3) MG/3ML IN SOLN: 0.5-2.5 (3) MG/3ML | RESPIRATORY_TRACT | Qty: 3 | Fill #0

## 2024-05-16 MED FILL — PREGABALIN 100 MG PO CAPS: 100 mg | ORAL | Qty: 2 | Fill #0

## 2024-05-16 MED FILL — VALSARTAN 160 MG PO TABS: 160 mg | ORAL | Qty: 1 | Fill #0

## 2024-05-16 NOTE — ED Notes (Signed)
"  Report given to Tinnie, RN. Nurse was informed of reason for arrival, vitals, labs, medications, orders, procedures, results, anything left pending and current plan of action. Questions were asked and received prior to departure from the patient.   "

## 2024-05-16 NOTE — Progress Notes (Signed)
"  End of Shift Note    Bedside shift change report given to Almarie, CHARITY FUNDRAISER (oncoming nurse) by JANELL GERALD, RN (offgoing nurse).  Report included the following information SBAR, Kardex, and MAR    Shift worked:  11:28am-7pm     Shift summary and any significant changes:     Pt arrived to this unit from the ED; report received from [place], CHARITY FUNDRAISER. Pt tolerated care fairly well. Medications were given and education was provided. Hourly rounding completed. Pt made no complaints of pain during this shift. Pt up x1 with the walker to the BSC/BR; pt had a large BM. Family present at bedside. Admission database completed. Pt set up for all meals.      Concerns for physician to address:  N/A     Zone phone for oncoming shift:   [phone]       Activity:     Number times ambulated in hallways past shift: 0  Number of times OOB to chair past shift: 0    Cardiac:   Cardiac Monitoring: Yes      Cardiac Rhythm: Sinus rhythm    Access:  Current line(s): PIV     Genitourinary:   Urinary Status: Voiding    Respiratory:   O2 Device: Nasal cannula  Chronic home O2 use?: YES  Incentive spirometer at bedside: N/A    GI:  Last BM (including prior to admit): 05/16/24  Current diet:  ADULT ORAL NUTRITION SUPPLEMENT; Breakfast, Lunch, Dinner; Educational Psychologist Protein Oral Supplement  ADULT DIET; Dysphagia - Soft and Bite Sized; Low Fat/Low Chol/High Fiber/NAS  Passing flatus: YES    Pain Management:   Patient states pain is manageable on current regimen: N/A    Skin:  Braden Scale Score: 18  Interventions: Wound Offloading (Prevention Methods): Bed, pressure reduction mattress, Elevate heels, Pillows, Repositioning, Turning    Patient Safety:  Fall Risk: Nursing Judgement-Fall Risk High(Add Comments): Yes  Fall Risk Interventions  Nursing Judgement-Fall Risk High(Add Comments): Yes  Toilet Every 2 Hours-In Advance of Need: Yes  Hourly Visual Checks: Awake, In bed  Fall Visual Posted: Fall sign posted, Socks  Room Door Open: Yes  Patient  Moved Closer to Nursing Station: No    Active Consults:   IP CONSULT TO SOCIAL WORK    Length of Stay:  Expected LOS: 2  Actual LOS: 0    Jeanett Antonopoulos, RN                            "

## 2024-05-16 NOTE — H&P (Signed)
 "    Hospitalist Admission Note    NAME:   Kimberly [name]   DOB: 01-18-1924   MRN: [id number]     Date/Time: 05/16/2024 2:25 PM    Patient PCP: Kennie Darin MATSU, MD    ______________________________________________________________________  Given the patient's current clinical presentation, I have a high level of concern for decompensation if discharged from the emergency department.  Complex decision making was performed, which includes reviewing the patient's available past medical records, laboratory results, and x-ray films.       My assessment of this patient's clinical condition and my plan of care is as follows.    Assessment / Plan:  Chronic hypoxic RF, uses prn O2 suppl  Atelectasis   Subpleural fibrotic changes left upper lobe   Imaging reviewed and discussed with family at bedside  No respiratory symptoms currently.  She appears stable on 2LNC.  We were not able to capture any hypoxia here  [id number], she does not appear volume overloaded  Flu and covid neg, procal 0.08  check RVP  Nebs prn  O2 suppl  Pt/ot to eval    Neuropathy  RLS  HTN  Resume lyrica , losartan, requip  and cymbalta     Hyperkalemia  Lokelma  x1  Repeat bmp in the AM                  Medical Decision Making:  I personally reviewed labs  I personally reviewed imaging  Toxic drug monitoring  I personally reviewed EKG  Discussed case with: patient, her niece, RN, plan of care discussed.       Code Status: DNR, per niece. Pt has a DDNR.   DVT Prophylaxis: lovenox   Baseline: from home, niece is primary care giver, walks with a Rolator.     Subjective:   CHIEF COMPLAINT: hypoxia    HISTORY OF PRESENT ILLNESS:     Kimberly [name] is a 89 y.o.  female with PMHx significant for dementia, neuropathy, chronic hypoxic RF on prn O2 suppl, presents to the ER for evaluation of hypoxia.  Pt lives at home, has niece who cares for her 24hrs.  Per niece, pt graduated from hospice in July.  She uses prn O2 suppl (no formal lung pathology diagnosis, no hx of copd  or asthma). Patient was walking around the house and niece had put pulse ox on the patient and saw her sats was 85% on RA, improved with O2 suppl.  EMS was called as niece wanted to make sur ethere was no lung pathology that cause the hypoxia.  By the time EMS arrived, O2 sats has improved.  Pt was taken to the ER for evaluation.    In the ER, SpO2 93% on 2LNC.  There was fever, chills, cp, sob, palpitations, n/v/d.  Vitals/imaging reviewed.    We were asked to admit for work up and evaluation of the above problems.     Past Medical History:   Diagnosis Date    Acute CVA (cerebrovascular accident) (HCC) 09/12/2022    Adverse effect of anesthesia     slow to wake    Anginal pain     Arthritis     Breast cancer (HCC) 03/10/2018     left lumpectomy    Cancer (HCC) 01/2018    breast    Chronic pain     neuropathy    Constipation     GERD (gastroesophageal reflux disease)     Headache     Hearing loss  High cholesterol     Hypertension     Incontinence     Joint pain     Memory disorder     Muscle pain     Osteoporosis     Ringing in the ears     Snoring     Visual disturbance         Past Surgical History:   Procedure Laterality Date    BREAST LUMPECTOMY Left 03/10/2018    LEFT BREAST LUMPECTOMY WITH ULTRASOUND GUIDANCE performed by Lucy Ozell PARAS, MD at [facility]    HYSTERECTOMY (CERVIX STATUS UNKNOWN)      ORTHOPEDIC SURGERY      epidural pain mtg - pt unsure    OTHER SURGICAL HISTORY Right 10/15/2016    ligament correction right foot    OTHER SURGICAL HISTORY  11/26/2016    Pituitary tumor removal    US  BREAST BIOPSY W LOC DEVICE 1ST LESION LEFT Left 01/21/2018    US  BREAST NEEDLE BIOPSY LEFT 01/21/2018 [facility] US        Social History     Tobacco Use    Smoking status: Never     Passive exposure: Never    Smokeless tobacco: Never   Substance Use Topics    Alcohol use: Not Currently        Family History   Problem Relation Age of Onset    Dementia Sister     Dementia Brother     Cancer Daughter      Breast Cancer Daughter 11    Breast Cancer Sister 63    Breast Cancer Mother 47    No Known Problems Father        Allergies   Allergen Reactions    Penicillins Hives and Myalgia    Sulfa Antibiotics Hives     Other reaction(s): Unknown (comments)        Prior to Admission medications   Medication Sig Start Date End Date Taking? Authorizing Provider   pregabalin  (LYRICA ) 200 MG capsule TAKE 1 CAPSULE BY MOUTH TWICE DAILY. MAX DAILY AMOUNT: 400 MG 04/20/24 10/17/24 Yes Kennie Darin MATSU, MD   valsartan  (DIOVAN ) 160 MG tablet Take 1 tablet by mouth daily 03/30/24  Yes Enowtaku, Darin MATSU, MD   donepezil  (ARICEPT ) 10 MG tablet Take 1 tablet by mouth daily (before dinner) 03/30/24  Yes Claudene Debby LABOR, MD   DULoxetine  (CYMBALTA ) 30 MG extended release capsule Take 1 capsule by mouth daily (before dinner) 03/30/24  Yes Claudene Debby LABOR, MD   docusate sodium  (COLACE) 100 MG capsule Take 1 capsule by mouth daily  Patient taking differently: Take 1 capsule by mouth 2 times daily 03/12/24  Yes Kennie Darin MATSU, MD   DULoxetine  (CYMBALTA ) 60 MG extended release capsule Take 1 capsule by mouth daily 03/08/24  Yes Claudene Debby LABOR, MD   albuterol  (PROVENTIL ) (2.5 MG/3ML) 0.083% nebulizer solution Take 3 mLs by nebulization 4 times daily as needed for Wheezing 02/21/24  Yes Kennie Darin MATSU, MD   cetirizine  (ZYRTEC ) 5 MG tablet Take 1 tablet by mouth daily 01/13/24  Yes Kennie Darin MATSU, MD   rOPINIRole  (REQUIP ) 0.25 MG tablet Take 1 tablet by mouth nightly 09/17/23  Yes Enowtaku, Darin MATSU, MD   Omega-3 Fatty Acids (FISH OIL) 1000 MG CPDR Take 3 capsules by mouth   Yes [provider]   chlorhexidine (PERIDEX) 0.12 % solution Swish and spit 15 mLs 2 times daily   Yes [provider]   vitamin E   1000 units capsule Take 1 capsule by mouth daily   Yes [provider]   Cholecalciferol (VITAMIN D3) 50 MCG (2000 UT) CAPS Take 1 capsule by mouth daily   Yes [provider]   ammonium lactate (LAC-HYDRIN) 12 %  lotion Apply 1 Application topically as needed in the morning and 1 Application as needed in the evening. 03/29/22  Yes [provider]   ascorbic acid  (VITAMIN C ) 100 MG tablet Take 1 tablet by mouth daily   Yes [provider]   acetaminophen  (TYLENOL ) 500 MG tablet Take by mouth every 6 hours as needed for Pain   Yes Automatic Reconciliation, Ar   Zinc 30 MG CAPS Take by mouth    [provider]   carbamide peroxide (DEBROX) 6.5 % otic solution Place 5 drops in ear(s) 2 times daily  Patient not taking: Reported on 05/16/2024 09/16/22   [provider]         Objective:   VITALS:    Patient Vitals for the past 24 hrs:   BP Temp Temp src Pulse Resp SpO2 Height Weight   05/16/24 1128 138/69 97.7 F (36.5 C) Oral 73 18 96 % -- --   05/16/24 1000 (!) 157/67 -- -- 78 19 92 % -- --   05/16/24 0900 (!) 157/68 -- -- 73 19 93 % -- --   05/16/24 0800 (!) 145/76 -- -- 73 15 93 % -- --   05/16/24 0600 (!) 169/68 -- -- 83 19 -- -- --   05/16/24 0045 (!) 152/77 -- -- 81 21 95 % -- --   05/16/24 0030 (!) 148/64 -- -- 82 21 94 % -- --   05/16/24 0015 (!) 155/71 -- -- 85 22 93 % -- --   05/16/24 0006 -- -- -- 87 22 93 % -- --   05/16/24 0000 (!) 159/72 -- -- 85 19 92 % -- --   05/15/24 2345 (!) 173/78 -- -- 88 29 94 % -- --   05/15/24 2330 138/69 -- -- 92 (!) 32 93 % -- --   05/15/24 2328 (!) 144/68 -- -- 93 (!) 33 93 % -- --   05/15/24 2326 (!) 144/68 98.3 F (36.8 C) Oral 92 24 93 % 1.575 m (5' 2) 68.3 kg (150 lb 9.2 oz)       Temp (24hrs), Avg:98 F (36.7 C), Min:97.7 F (36.5 C), Max:98.3 F (36.8 C)      O2 Flow Rate (L/min): 2 L/min O2 Device: Nasal cannula    Wt Readings from Last 12 Encounters:   05/15/24 68.3 kg (150 lb 9.2 oz)   03/30/24 65.9 kg (145 lb 3.2 oz)   03/13/24 66 kg (145 lb 8.1 oz)   02/23/24 68 kg (150 lb)   02/21/24 68.4 kg (150 lb 12.7 oz)   12/08/23 67.2 kg (148 lb 2.4 oz)   09/03/23 66 kg (145 lb 6.4 oz)   07/17/23 68.3 kg (150 lb 9.6 oz)   04/17/23 66.2 kg (145  lb 15.1 oz)   03/28/23 68.2 kg (150 lb 5.7 oz)   03/25/23 67.6 kg (149 lb 0.5 oz)   03/20/23 67 kg (147 lb 9.6 oz)         PHYSICAL EXAM:  General:    Alert, cooperative, appears stated age.     Lungs:   CTA b/l.  No wheezing or Rhonchi. No rales.  Chest wall:  No tenderness.  No accessory muscle use.  Heart:  Regular  rhythm,  No  Murmur.   No edema  Abdomen:   Soft, NT. ND  BS+  Extremities: No cyanosis.  No clubbing,      Skin turgor normal, Radial dial pulse 2+. Capillary refill normal  Neurologic: No facial asymmetry. No aphasia or slurred speech. Symmetrical strength, Sensation grossly intact. AAOx4.         LAB DATA REVIEWED:    Recent Results (from the past 12 hours)   Basic Metabolic Panel    Collection Time: 05/16/24 10:56 AM   Result Value Ref Range    Sodium 141 136 - 145 mmol/L    Potassium 5.6 (H) 3.5 - 5.1 mmol/L    Chloride 106 98 - 107 mmol/L    CO2 25 20 - 29 mmol/L    Anion Gap 10 2 - 14 mmol/L    Glucose 96 65 - 100 mg/dL    BUN 16 8 - 23 MG/DL    Creatinine 9.25 9.39 - 1.00 MG/DL    BUN/Creatinine Ratio 21 (H) 12 - 20      Est, Glom Filt Rate 73 >59 ml/min/1.49m2    Calcium  9.1 8.2 - 9.6 MG/DL   Procalcitonin    Collection Time: 05/16/24 10:56 AM   Result Value Ref Range    Procalcitonin 0.08 ng/mL         CTA CHEST W WO CONTRAST PE Eval  Result Date: 05/16/2024  EXAM:  CTA CHEST W WO CONTRAST INDICATION: PE COMPARISON: 07/20/2009 TECHNIQUE: Helical thin section chest CT following intravenous administration of nonionic contrast 100 mL of isovue  370 according to departmental PE protocol. Coronal and sagittal reformats were performed. 3D post processing was performed.  CT dose reduction was achieved through the use of a standardized protocol tailored for this examination and automatic exposure control for dose modulation. FINDINGS: This is a good quality study for the evaluation of pulmonary embolism to the first subsegmental arterial level. There is no pulmonary embolism to this level.  MEDIASTINUM: Calcified mediastinal lymph nodes are noted. HILA: No mass or lymphadenopathy. THORACIC AORTA: No aneurysm. HEART: Normal in size. ESOPHAGUS: No wall thickening or dilatation. TRACHEA/BRONCHI: Patent. PLEURA: No effusion or pneumothorax. LUNGS: Bilateral lower lobe atelectasis. Subpleural fibrotic changes are seen in the left upper lobe. UPPER ABDOMEN: Hepatic cysts are noted. Additionally, there is a stable 12 mm hypodense lesion right hepatic lobe which is of indeterminate etiology. Bilateral renal cysts are noted. BONES: Degenerative changes are seen in the thoracic spine and shoulder joints bilaterally. Chronic compression fractures T9 and T10.     Bilateral lower lobe atelectasis. Subpleural fibrotic changes left upper lobe potentially treatment related. No evidence of pulmonary embolism. Electronically signed by DARICE COLON     _______________________________________________________________________    TOTAL TIME:  76 Minutes    Critical Care Provided     Minutes non procedure based    Signed: Roma Daniels, MD    Procedures: see electronic medical records for all procedures/Xrays and details which were not copied into this note but were reviewed prior to creation of Plan.        "

## 2024-05-16 NOTE — ED Notes (Signed)
"  Pt left for CT.  "

## 2024-05-17 LAB — CBC WITH AUTO DIFFERENTIAL
Basophils %: 1.5 % — ABNORMAL HIGH (ref 0.0–1.0)
Basophils Absolute: 0.05 K/UL (ref 0.00–0.10)
Eosinophils %: 8.1 % — ABNORMAL HIGH (ref 0.0–7.0)
Eosinophils Absolute: 0.27 K/UL (ref 0.00–0.40)
Hematocrit: 35.4 % (ref 35.0–47.0)
Hemoglobin: 10.6 g/dL — ABNORMAL LOW (ref 11.5–16.0)
Immature Granulocytes %: 0.3 % (ref 0.0–0.5)
Immature Granulocytes Absolute: 0.01 K/UL (ref 0.00–0.04)
Lymphocytes %: 27.4 % (ref 12.0–49.0)
Lymphocytes Absolute: 0.91 K/UL (ref 0.80–3.50)
MCH: 21.9 pg — ABNORMAL LOW (ref 26.0–34.0)
MCHC: 29.9 g/dL — ABNORMAL LOW (ref 30.0–36.5)
MCV: 73.3 FL — ABNORMAL LOW (ref 80.0–99.0)
MPV: 10.9 FL (ref 8.9–12.9)
Monocytes %: 16 % — ABNORMAL HIGH (ref 5.0–13.0)
Monocytes Absolute: 0.53 K/UL (ref 0.00–1.00)
Neutrophils %: 46.7 % (ref 32.0–75.0)
Neutrophils Absolute: 1.55 K/UL — ABNORMAL LOW (ref 1.80–8.00)
Nucleated RBCs: 0 /100{WBCs}
Platelets: 241 K/uL (ref 150–400)
RBC: 4.83 M/uL (ref 3.80–5.20)
RDW: 16.7 % — ABNORMAL HIGH (ref 11.5–14.5)
WBC: 3.3 K/uL — ABNORMAL LOW (ref 3.6–11.0)
nRBC: 0 K/uL (ref 0.00–0.01)

## 2024-05-17 LAB — BASIC METABOLIC PANEL W/ REFLEX TO MG FOR LOW K
Anion Gap: 10 mmol/L (ref 2–14)
BUN/Creatinine Ratio: 19 (ref 12–20)
BUN: 16 mg/dL (ref 8–23)
CO2: 22 mmol/L (ref 20–29)
Calcium: 9.1 mg/dL (ref 8.2–9.6)
Chloride: 107 mmol/L (ref 98–107)
Creatinine: 0.82 mg/dL (ref 0.60–1.00)
Est, Glom Filt Rate: 64 ml/min/1.73m2 (ref >59)
Glucose: 99 mg/dL (ref 65–100)
Potassium: 4.3 mmol/L (ref 3.5–5.1)
Sodium: 139 mmol/L (ref 136–145)

## 2024-05-17 MED ORDER — HALOPERIDOL LACTATE 5 MG/ML IJ SOLN
5 | Freq: Two times a day (BID) | INTRAMUSCULAR | Status: DC | PRN
Start: 2024-05-17 — End: 2024-05-17
  Administered 2024-05-17: 19:00:00 1 mg via INTRAVENOUS

## 2024-05-17 MED FILL — LOVENOX 40 MG/0.4ML IJ SOSY: 40 MG/0.4ML | INTRAMUSCULAR | Qty: 0.4 | Fill #0

## 2024-05-17 MED FILL — LOKELMA 5 G PO PACK: 5 g | ORAL | Qty: 1 | Fill #0

## 2024-05-17 MED FILL — DULOXETINE HCL 30 MG PO CPEP: 30 mg | ORAL | Qty: 1 | Fill #0

## 2024-05-17 MED FILL — PREGABALIN 100 MG PO CAPS: 100 mg | ORAL | Qty: 2 | Fill #0

## 2024-05-17 MED FILL — DULOXETINE HCL 30 MG PO CPEP: 30 mg | ORAL | Qty: 2 | Fill #0

## 2024-05-17 MED FILL — ARICEPT 5 MG PO TABS: 5 mg | ORAL | Qty: 2 | Fill #0

## 2024-05-17 MED FILL — HALOPERIDOL LACTATE 5 MG/ML IJ SOLN: 5 mg/mL | INTRAMUSCULAR | Qty: 1 | Fill #0

## 2024-05-17 MED FILL — ACETAMINOPHEN 325 MG PO TABS: 325 mg | ORAL | Qty: 2 | Fill #0

## 2024-05-17 MED FILL — CETIRIZINE HCL 10 MG PO TABS: 10 mg | ORAL | Qty: 1 | Fill #0

## 2024-05-17 MED FILL — VALSARTAN 160 MG PO TABS: 160 mg | ORAL | Qty: 1 | Fill #0

## 2024-05-17 MED FILL — ROPINIROLE HCL 0.25 MG PO TABS: 0.25 mg | ORAL | Qty: 1 | Fill #0

## 2024-05-17 NOTE — Plan of Care (Signed)
"    Problem: Safety - Adult  Goal: Free from fall injury  05/17/2024 0432 by Michalene Norris, RN  Outcome: Progressing  05/16/2024 1536 by Southern, Katelyn, RN  Outcome: Progressing     Problem: Chronic Conditions and Co-morbidities  Goal: Patient's chronic conditions and co-morbidity symptoms are monitored and maintained or improved  05/17/2024 0432 by Michalene Norris, RN  Outcome: Progressing  05/16/2024 1536 by Southern, Katelyn, RN  Outcome: Progressing     Problem: ABCDS Injury Assessment  Goal: Absence of physical injury  05/17/2024 0432 by Michalene Norris, RN  Outcome: Progressing  05/16/2024 1536 by Southern, Katelyn, RN  Outcome: Progressing     Problem: Pain  Goal: Verbalizes/displays adequate comfort level or baseline comfort level  Outcome: Progressing     Problem: Skin/Tissue Integrity  Goal: Skin integrity remains intact  Description: 1.  Monitor for areas of redness and/or skin breakdown  2.  Assess vascular access sites hourly  3.  Every 4-6 hours minimum:  Change oxygen saturation probe site  4.  Every 4-6 hours:  If on nasal continuous positive airway pressure, respiratory therapy assess nares and determine need for appliance change or resting period  Outcome: Progressing     "

## 2024-05-17 NOTE — Progress Notes (Signed)
"  End of Shift Note    Bedside shift change report given to Katelyn, CHARITY FUNDRAISER (oncoming nurse) by MICHALENE NORRIS, RN (offgoing nurse).  Report included the following information SBAR, Kardex, and MAR    Shift worked:  1900-0700   Shift summary and any significant changes:     Uneventful night, VSS, IV line patent, flushes well. All due med given as ordered. CHG bathed, had BM within shift and purewick in use. Family member stayed overnight with patient. Bedside table, call bell and phone within reach, able to make needs known.    Concerns for physician to address:  N/A     Zone phone for oncoming shift:   [phone]       Activity:  Level of Assistance: Moderate assist, patient does 50-74%  Number times ambulated in hallways past shift: 0  Number of times OOB to chair past shift: 0    Cardiac:   Cardiac Monitoring: Yes      Cardiac Rhythm: Sinus rhythm    Access:  Current line(s): PIV     Genitourinary:   Urinary Status: External catheter, Voiding    Respiratory:   O2 Device: Nasal cannula  Chronic home O2 use?: YES  Incentive spirometer at bedside: N/A    GI:  Last BM (including prior to admit): 05/16/24  Current diet:  ADULT ORAL NUTRITION SUPPLEMENT; Breakfast, Lunch, Dinner; Educational Psychologist Protein Oral Supplement  ADULT DIET; Dysphagia - Soft and Bite Sized; Low Fat/Low Chol/High Fiber/NAS  Passing flatus: YES    Pain Management:   Patient states pain is manageable on current regimen: N/A    Skin:  Braden Scale Score: 18  Interventions: Wound Offloading (Prevention Methods): Bed, pressure reduction mattress, Repositioning    Patient Safety:  Fall Risk: Nursing Judgement-Fall Risk High(Add Comments): Yes  Fall Risk Interventions  Nursing Judgement-Fall Risk High(Add Comments): Yes  Toilet Every 2 Hours-In Advance of Need: Yes  Hourly Visual Checks: Awake, In bed  Fall Visual Posted: Fall sign posted, Socks  Room Door Open: Yes  Patient Moved Closer to Nursing Station: Yes    Active Consults:   IP CONSULT TO SOCIAL  WORK    Length of Stay:  Expected LOS: 2  Actual LOS: 2    Olivianna Higley, RN                            "

## 2024-05-17 NOTE — Progress Notes (Signed)
"  Patient confused and uncooperative, attempting to get out of bed, stating she needs to go home. Patient pulled out IV and refused to take night medications. Perfect served on call hospitalist; order given for PRN IM haldol . Haldol  given per order. Patient's niece now present at the bedside and agreed to haldol  being given due to patient's uncooperativeness with her as well.  "

## 2024-05-17 NOTE — Progress Notes (Signed)
 "      Hospitalist Progress Note    NAME:   Kimberly [name]   DOB: [birth year]   MRN: [id number]     Date/Time: 05/17/2024 12:29 PM  Patient PCP: Kennie Darin MATSU, MD      Assessment / Plan:  Chronic hypoxic RF, uses prn O2 suppl  Atelectasis   Subpleural fibrotic changes left upper lobe   Imaging reviewed and discussed with family at bedside  No respiratory symptoms currently.  She appears stable on 2LNC.  We were not able to capture any hypoxia here  [id number], she does not appear volume overloaded  RVP negative, procal 0.08  Nebs prn  O2 suppl  Pt/ot to eval, likely home tomorrow.  Wean O2 as tolerated, currently on 1LNC.        Neuropathy  RLS  HTN  Cont'  lyrica , losartan, requip  and cymbalta      Hyperkalemia  S/p Lokelma  x1  K wnl today             Medical Decision Making:   I personally reviewed labs  I personally reviewed imaging  Toxic drug monitoring  I personally reviewed EKG  Discussed case with: RN, discussed plan of care and dispo during round        Code Status: DNR  DVT Prophylaxis: lovenox     Subjective:       Pt is confused during round. Niece left this AM.  No acute event per RN.    Discussed with RN events overnight.       Objective:     VITALS:   Last 24hrs VS reviewed since prior progress note. Most recent are:  Patient Vitals for the past 24 hrs:   BP Temp Temp src Pulse Resp SpO2   05/17/24 0737 (!) 171/80 97.7 F (36.5 C) Oral 79 18 94 %   05/17/24 0300 -- -- -- 79 -- 96 %   05/17/24 0259 (!) 171/81 97.9 F (36.6 C) Oral 79 17 92 %   05/16/24 1933 (!) 167/73 98.6 F (37 C) Oral 88 18 95 %   05/16/24 1502 (!) 143/67 98.1 F (36.7 C) -- 90 18 93 %         Intake/Output Summary (Last 24 hours) at 05/17/2024 1229  Last data filed at 05/17/2024 0300  Gross per 24 hour   Intake --   Output 650 ml   Net -650 ml        I had a face to face encounter and independently examined this patient on 05/17/2024, as outlined below:  PHYSICAL EXAM:  General: Alert, cooperative   EENT:  EOMI. Anicteric  sclerae.  Resp:  CTA bilaterally, no wheezing or rales.  No accessory muscle use  CV:  Regular  rhythm,  No edema  GI:  Soft, Non distended, Non tender.  +BS  Neurologic:  Alert and oriented X 0, normal speech, no focal deficits   Psych:   Midly agitated  Skin:  No rashes.  No jaundice    Reviewed most current lab test results and cultures  YES  Reviewed most current radiology test results   YES  Review and summation of old records today    NO  Reviewed patient's current orders and MAR    YES  PMH/SH reviewed - no change compared to H&P      Procedures: see electronic medical records for all procedures/Xrays and details which were not copied into this note but were reviewed prior to creation of Plan.  LABS:  I reviewed today's most current labs and imaging studies.  Pertinent labs include:  Recent Labs     05/15/24  2345 05/17/24  0549   WBC 5.4 3.3*   HGB 11.7 10.6*   HCT 38.2 35.4   PLT 270 241     Recent Labs     05/15/24  2345 05/16/24  1056 05/17/24  0549   NA 143 141 139   K 4.4 5.6* 4.3   CL 109* 106 107   CO2 22 25 22    GLUCOSE 153* 96 99   BUN 19 16 16    CREATININE 0.84 0.74 0.82   CALCIUM  9.4 9.1 9.1       Signed: Roma Daniels, MD     Total time spent caring for the patient: >35 minutes      "

## 2024-05-17 NOTE — Progress Notes (Addendum)
"  End of Shift Note    Bedside shift change report given to Chontele, RN (oncoming nurse) by JANELL GERALD, RN (offgoing nurse).  Report included the following information SBAR, Kardex, and MAR    Shift worked:  7am-7pm     Shift summary and any significant changes:     Pt tolerated care fairly well. Medications were given and education was provided. PRN Haldol  given x1 for increased agitated and restlessness. Hourly rounding completed. Pt made no complaints of pain during this shift. Pt up x1 with the walker to the [facility]; pt had a large BM. Family present at bedside. Pt set up for all meals. Pt became increasingly agitated and restless around 12 pm. Pt pulled off telemetry, pulled out IV, and tried to get up constantly without notifying staff. Bed alarm in place for patient safety.      Concerns for physician to address:  N/A     Zone phone for oncoming shift:   [phone]       Activity:  Level of Assistance: Moderate assist, patient does 50-74%  Number times ambulated in hallways past shift: 0  Number of times OOB to chair past shift: 0    Cardiac:   Cardiac Monitoring: Yes      Cardiac Rhythm: Sinus rhythm    Access:  Current line(s): PIV     Genitourinary:   Urinary Status: Voiding    Respiratory:   O2 Device: Nasal cannula  Chronic home O2 use?: YES  Incentive spirometer at bedside: N/A    GI:  Last BM (including prior to admit): 05/17/24  Current diet:  ADULT ORAL NUTRITION SUPPLEMENT; Breakfast, Lunch, Dinner; Educational Psychologist Protein Oral Supplement  ADULT DIET; Dysphagia - Soft and Bite Sized; Low Fat/Low Chol/High Fiber/NAS  Passing flatus: YES    Pain Management:   Patient states pain is manageable on current regimen: N/A    Skin:  Braden Scale Score: 18  Interventions: Wound Offloading (Prevention Methods): Bed, pressure reduction mattress, Elevate heels, Pillows, Repositioning, Turning    Patient Safety:  Fall Risk: Nursing Judgement-Fall Risk High(Add Comments): Yes  Fall Risk  Interventions  Nursing Judgement-Fall Risk High(Add Comments): Yes  Toilet Every 2 Hours-In Advance of Need: Yes  Hourly Visual Checks: Awake, In bed  Fall Visual Posted: Fall sign posted, Socks  Room Door Open: Yes  Patient Moved Closer to Nursing Station: No    Active Consults:   IP CONSULT TO SOCIAL WORK    Length of Stay:  Expected LOS: 2  Actual LOS: 1    Tereka Thorley, RN                            "

## 2024-05-18 LAB — CBC WITH AUTO DIFFERENTIAL
Basophils %: 1.2 % — ABNORMAL HIGH (ref 0.0–1.0)
Eosinophils %: 4.2 % (ref 0.0–7.0)
Hematocrit: 39.2 % (ref 35.0–47.0)
Hemoglobin: 12.2 g/dL (ref 11.5–16.0)
Immature Granulocytes %: 0.2 % (ref 0.0–0.5)
Lymphocytes %: 18.2 % (ref 12.0–49.0)
MCH: 22.3 pg — ABNORMAL LOW (ref 26.0–34.0)
MCV: 71.8 FL — ABNORMAL LOW (ref 80.0–99.0)
Monocytes %: 13.1 % — ABNORMAL HIGH (ref 5.0–13.0)
Nucleated RBCs: 0 /100{WBCs}
Platelets: 256 K/uL (ref 150–400)
RBC: 5.46 M/uL — ABNORMAL HIGH (ref 3.80–5.20)
RDW: 16.3 % — ABNORMAL HIGH (ref 11.5–14.5)
WBC: 5.1 K/uL (ref 3.6–11.0)
nRBC: 0 K/uL (ref 0.00–0.01)

## 2024-05-18 LAB — BASIC METABOLIC PANEL W/ REFLEX TO MG FOR LOW K
Anion Gap: 11 mmol/L (ref 2–14)
BUN/Creatinine Ratio: 15 (ref 12–20)
BUN: 11 mg/dL (ref 8–23)
CO2: 25 mmol/L (ref 20–29)
Chloride: 105 mmol/L (ref 98–107)
Creatinine: 0.72 mg/dL (ref 0.60–1.00)
Glucose: 98 mg/dL (ref 65–100)
Potassium: 4.5 mmol/L (ref 3.5–5.1)
Sodium: 141 mmol/L (ref 136–145)

## 2024-05-18 MED ORDER — HALOPERIDOL LACTATE 5 MG/ML IJ SOLN
5 | Freq: Two times a day (BID) | INTRAMUSCULAR | Status: DC | PRN
Start: 2024-05-18 — End: 2024-05-17

## 2024-05-18 MED ORDER — HALOPERIDOL LACTATE 5 MG/ML IJ SOLN
5 | Freq: Two times a day (BID) | INTRAMUSCULAR | Status: DC | PRN
Start: 2024-05-18 — End: 2024-05-20
  Administered 2024-05-18: 02:00:00 2 mg via INTRAVENOUS

## 2024-05-18 MED ORDER — ONDANSETRON 4 MG PO TBDP
4 | Freq: Three times a day (TID) | ORAL | Status: DC | PRN
Start: 2024-05-18 — End: 2024-05-20

## 2024-05-18 MED FILL — ARICEPT 5 MG PO TABS: 5 mg | ORAL | Qty: 2

## 2024-05-18 MED FILL — ACETAMINOPHEN 325 MG PO TABS: 325 mg | ORAL | Qty: 2 | Fill #0

## 2024-05-18 MED FILL — PREGABALIN 100 MG PO CAPS: 100 mg | ORAL | Qty: 2 | Fill #0

## 2024-05-18 MED FILL — MELATONIN 3 MG PO TABS: 3 mg | ORAL | Qty: 1 | Fill #0

## 2024-05-18 MED FILL — ROPINIROLE HCL 0.25 MG PO TABS: 0.25 mg | ORAL | Qty: 1 | Fill #0

## 2024-05-18 MED FILL — LOVENOX 40 MG/0.4ML IJ SOSY: 40 MG/0.4ML | INTRAMUSCULAR | Qty: 0.4

## 2024-05-18 MED FILL — CETIRIZINE HCL 10 MG PO TABS: 10 mg | ORAL | Qty: 1

## 2024-05-18 MED FILL — HALOPERIDOL LACTATE 5 MG/ML IJ SOLN: 5 mg/mL | INTRAMUSCULAR | Qty: 1 | Fill #0

## 2024-05-18 MED FILL — PREGABALIN 100 MG PO CAPS: 100 mg | ORAL | Qty: 2

## 2024-05-18 MED FILL — DULOXETINE HCL 30 MG PO CPEP: 30 mg | ORAL | Qty: 1

## 2024-05-18 MED FILL — DULOXETINE HCL 30 MG PO CPEP: 30 mg | ORAL | Qty: 2

## 2024-05-18 MED FILL — VALSARTAN 160 MG PO TABS: 160 mg | ORAL | Qty: 1

## 2024-05-18 NOTE — Progress Notes (Signed)
"  Occupational Therapy    Orders acknowledged, chart reviewed. Spoke with RN who reports pt currently sleeping and to not arouse at this time (noted pt received Haldol  overnight due to agitation/confusion/uncooperative). Will defer and follow-up later as able and medically appropriate. Discussed with RN and PT. Thanks.    Greig Eagles, MS, OTR/L  "

## 2024-05-18 NOTE — Progress Notes (Signed)
 "      Hospitalist Progress Note    NAME:   Kimberly [name]   DOB: [birth year]   MRN: [id number]     Date/Time: 05/18/2024 4:15 PM  Patient PCP: Kennie Darin MATSU, MD      Assessment / Plan:  Chronic hypoxic RF, uses prn O2 suppl  Atelectasis   Subpleural fibrotic changes left upper lobe   Imaging reviewed and discussed with family at bedside  No respiratory symptoms currently.  She appears stable on 2LNC.  We were not able to capture any hypoxia here  [id number], she does not appear volume overloaded  RVP negative, procal 0.08  Nebs prn  O2 suppl  Pt/ot to eval, likely home tomorrow.  Wean O2 as tolerated, currently on 1LNC.      10/13: No acute issues, remains on oxygen  Pulm consulted  Neuropathy  RLS  HTN  Cont'  lyrica , losartan, requip  and cymbalta      Hyperkalemia  S/p Lokelma  x1  K wnl today     Medical Decision Making:   I personally reviewed labs: CBC BMP  I personally reviewed imaging: CTA of the chest  Toxic drug monitoring: CBC on Lovenox   I personally reviewed EKG  Discussed case with: RN,pt , pt niece         Code Status: DNR  DVT Prophylaxis: lovenox     Subjective:   Follow-up acute respiratory failure   No acute event per RN.    Discussed with RN events overnight.       Objective:     VITALS:   Last 24hrs VS reviewed since prior progress note. Most recent are:  Patient Vitals for the past 24 hrs:   BP Pulse Resp SpO2   05/17/24 2123 (!) 182/102 (!) 106 18 97 %       No intake or output data in the 24 hours ending 05/18/24 1615       I had a face to face encounter and independently examined this patient on 05/18/2024, as outlined below:  PHYSICAL EXAM:  General: Alert, cooperative   EENT:  EOMI. Anicteric sclerae.  Resp:  CTA bilaterally, no wheezing or rales.  No accessory muscle use  CV:  Regular  rhythm,  No edema  GI:  Soft, Non distended, Non tender.  +BS  Neurologic:  Alert and oriented X 0, normal speech, no focal deficits   Psych:   Midly agitated  Skin:  No rashes.  No jaundice    Reviewed most  current lab test results and cultures  YES  Reviewed most current radiology test results   YES  Review and summation of old records today    NO  Reviewed patient's current orders and MAR    YES  PMH/SH reviewed - no change compared to H&P      Procedures: see electronic medical records for all procedures/Xrays and details which were not copied into this note but were reviewed prior to creation of Plan.      LABS:  I reviewed today's most current labs and imaging studies.  Pertinent labs include:  Recent Labs     05/15/24  2345 05/17/24  0549 05/18/24  0606   WBC 5.4 3.3* 5.1   HGB 11.7 10.6* 12.2   HCT 38.2 35.4 39.2   PLT 270 241 256     Recent Labs     05/16/24  1056 05/17/24  0549 05/18/24  0606   NA 141 139 141   K 5.6* 4.3  4.5   CL 106 107 105   CO2 25 22 25    GLUCOSE 96 99 98   BUN 16 16 11    CREATININE 0.74 0.82 0.72   CALCIUM  9.1 9.1 9.6       Signed: Salley Dames, MD     Total time spent caring for the patient: >35 minutes      "

## 2024-05-18 NOTE — Progress Notes (Addendum)
"  Physical Therapy    Order received, chart reviewed. Pt very agitated and confused overnight and has been medicated with haldol . Sleeping. Asked by RN to hold therapy eval at this time due to above.  Will follow.    Beverley Salinas Doornik PT    1415: returned to see pt, RN cleared but upon arrival pt still sleeping soundly, snoring, family prefers pt to rest. Will try back in am. Family states MD had mentioned pt for possible d/c tomorrow. Will attempt to see in am    Beverley Salinas Doornik PT  "

## 2024-05-18 NOTE — Plan of Care (Signed)
"    Problem: Safety - Adult  Goal: Free from fall injury  Outcome: Progressing     Problem: Chronic Conditions and Co-morbidities  Goal: Patient's chronic conditions and co-morbidity symptoms are monitored and maintained or improved  Outcome: Progressing     Problem: ABCDS Injury Assessment  Goal: Absence of physical injury  Outcome: Progressing     Problem: Pain  Goal: Verbalizes/displays adequate comfort level or baseline comfort level  Outcome: Progressing     Problem: Skin/Tissue Integrity  Goal: Skin integrity remains intact  Description: 1.  Monitor for areas of redness and/or skin breakdown  2.  Assess vascular access sites hourly  3.  Every 4-6 hours minimum:  Change oxygen saturation probe site  4.  Every 4-6 hours:  If on nasal continuous positive airway pressure, respiratory therapy assess nares and determine need for appliance change or resting period  Outcome: Progressing     "

## 2024-05-18 NOTE — Consults (Addendum)
 "     Pulmonary Consult Note    Requesting Provider:     Mode of visit - In person visit.    Reason for Consult: Respiratory failure.      Name: Kimberly [name]   DOB: [birth year]   MRN: [id number]   Date: 05/18/2024          HPI:     Kimberly [name] is a 89 year old female who presented to the emergency room on Friday with acute onset shortness of breath. The dyspnea occurs primarily with exertion, specifically when walking and trying to do activities. She is able to walk with the assistance of a walker and can walk distances such as from a parking lot to church without becoming short of breath, though going to the bathroom does not typically cause dyspnea. The shortness of breath was acute in onset, beginning on Friday, which prompted her emergency room visit. She has 2 lit oxygen at home which is uses PRN.    The patient denies associated cold symptoms including runny nose, runny eyes, or sore throat, though she reports some chronic runny nose that is not new. She denies cough, fever, and has no history of smoking, lung conditions, heart disease, pneumonia, or COVID-19. she appears much improved today and is currently on 2 liters of oxygen with sats of 96%.  Her CT chest showed focal area of left upper lobe subpleural fibrosis.  Pulmonary consult is requested for further evaluation.      Assessment:     -Acute hypoxemic respiratory failure.  - Left upper lobe subpleural fibrotic changes.  - Exertional dyspnea.    Plan:     -I independently reviewed CT chest, shows focal area of left upper lobe subpleural fibrotic changes.  Not significant enough to pursue any workup.  She also does not have significant hypoxemia.  Her CT chest does show some bibasilar atelectasis.  -Recommend incentive spirometry.  -PT/OT.  Part of her exertional dyspnea could be related to deconditioning as well.    L4    Review of systems:   All system reviewed, and negative except mentioned in HPI.      Objective:     Vital Signs:  BP 130/71   Pulse  88   Temp 98.8 F (37.1 C) (Oral)   Resp 20   Ht 1.575 m (5' 2)   Wt 68.3 kg (150 lb 9.2 oz)   SpO2 96%   BMI 27.54 kg/m      Temp (24hrs), Avg:98.6 F (37 C), Min:98.4 F (36.9 C), Max:98.8 F (37.1 C)           Physical Exam:  GEN: Sitting comfortably on the bed, not in acute distress.   CV: S1S2   LUNGS: Bilateral air entry  EXT: No cyanosis  NEURO: Alert, awake and follows commands.    Past Medical History:        has a past medical history of Acute CVA (cerebrovascular accident) (HCC), Adverse effect of anesthesia, Anginal pain, Arthritis, Breast cancer (HCC), Cancer (HCC), Chronic pain, Constipation, GERD (gastroesophageal reflux disease), Headache, Hearing loss, High cholesterol, Hypertension, Incontinence, Joint pain, Memory disorder, Muscle pain, Osteoporosis, Ringing in the ears, Snoring, and Visual disturbance.    Past Surgical History:      has a past surgical history that includes US  BREAST BIOPSY W LOC DEVICE 1ST LESION LEFT (Left, 01/21/2018); orthopedic surgery; other surgical history (Right, 10/15/2016); Hysterectomy; Breast lumpectomy (Left, 03/10/2018); and other surgical history (11/26/2016).  Home Medications:     Prior to Admission medications   Medication Sig Start Date End Date Taking? Authorizing Provider   pregabalin  (LYRICA ) 200 MG capsule TAKE 1 CAPSULE BY MOUTH TWICE DAILY. MAX DAILY AMOUNT: 400 MG 04/20/24 10/17/24 Yes Kennie Darin MATSU, MD   valsartan  (DIOVAN ) 160 MG tablet Take 1 tablet by mouth daily 03/30/24  Yes Enowtaku, Darin MATSU, MD   donepezil  (ARICEPT ) 10 MG tablet Take 1 tablet by mouth daily (before dinner) 03/30/24  Yes Claudene Debby LABOR, MD   DULoxetine  (CYMBALTA ) 30 MG extended release capsule Take 1 capsule by mouth daily (before dinner) 03/30/24  Yes Claudene Debby LABOR, MD   docusate sodium  (COLACE) 100 MG capsule Take 1 capsule by mouth daily  Patient taking differently: Take 1 capsule by mouth 2 times daily 03/12/24  Yes Kennie Darin MATSU, MD   DULoxetine  (CYMBALTA ) 60  MG extended release capsule Take 1 capsule by mouth daily 03/08/24  Yes Claudene Debby LABOR, MD   albuterol  (PROVENTIL ) (2.5 MG/3ML) 0.083% nebulizer solution Take 3 mLs by nebulization 4 times daily as needed for Wheezing 02/21/24  Yes Kennie Darin MATSU, MD   cetirizine  (ZYRTEC ) 5 MG tablet Take 1 tablet by mouth daily 01/13/24  Yes Kennie Darin MATSU, MD   rOPINIRole  (REQUIP ) 0.25 MG tablet Take 1 tablet by mouth nightly 09/17/23  Yes Enowtaku, Darin MATSU, MD   Omega-3 Fatty Acids (FISH OIL) 1000 MG CPDR Take 3 capsules by mouth   Yes [provider]   chlorhexidine (PERIDEX) 0.12 % solution Swish and spit 15 mLs 2 times daily   Yes [provider]   vitamin E  1000 units capsule Take 1 capsule by mouth daily   Yes [provider]   Cholecalciferol (VITAMIN D3) 50 MCG (2000 UT) CAPS Take 1 capsule by mouth daily   Yes [provider]   ammonium lactate (LAC-HYDRIN) 12 % lotion Apply 1 Application topically as needed in the morning and 1 Application as needed in the evening. 03/29/22  Yes [provider]   ascorbic acid  (VITAMIN C ) 100 MG tablet Take 1 tablet by mouth daily   Yes [provider]   acetaminophen  (TYLENOL ) 500 MG tablet Take by mouth every 6 hours as needed for Pain   Yes Automatic Reconciliation, Ar   Zinc 30 MG CAPS Take by mouth    [provider]   carbamide peroxide (DEBROX) 6.5 % otic solution Place 5 drops in ear(s) 2 times daily  Patient not taking: Reported on 05/16/2024 09/16/22   [provider]         Allergies/Social/Family History:     Allergies   Allergen Reactions    Penicillins Hives and Myalgia    Sulfa Antibiotics Hives     Other reaction(s): Unknown (comments)      Social History     Tobacco Use    Smoking status: Never     Passive exposure: Never    Smokeless tobacco: Never   Substance Use Topics    Alcohol use: Not Currently      Family History   Problem Relation Age of Onset    Dementia Sister     Dementia Brother      Cancer Daughter     Breast Cancer Daughter 67    Breast Cancer Sister 36    Breast Cancer Mother 67    No Known Problems Father          LABS AND  DATA:  Reviewed        Providence Loll, MD   Pulmonary Medicine  [facility]  [place]  [phone]  05/18/2024        "

## 2024-05-19 LAB — CBC WITH AUTO DIFFERENTIAL
Basophils %: 1.4 % — ABNORMAL HIGH (ref 0.0–1.0)
Basophils Absolute: 0.06 K/UL (ref 0.00–0.10)
Eosinophils %: 8.6 % — ABNORMAL HIGH (ref 0.0–7.0)
Eosinophils Absolute: 0.37 K/UL (ref 0.00–0.40)
Hematocrit: 37.1 % (ref 35.0–47.0)
Hemoglobin: 11.4 g/dL — ABNORMAL LOW (ref 11.5–16.0)
Immature Granulocytes %: 0.5 % (ref 0.0–0.5)
Lymphocytes %: 26.6 % (ref 12.0–49.0)
Lymphocytes Absolute: 1.14 K/UL (ref 0.80–3.50)
MCH: 22.4 pg — ABNORMAL LOW (ref 26.0–34.0)
MCV: 72.7 FL — ABNORMAL LOW (ref 80.0–99.0)
MPV: 11.1 FL (ref 8.9–12.9)
Monocytes %: 13.3 % — ABNORMAL HIGH (ref 5.0–13.0)
Monocytes Absolute: 0.57 K/UL (ref 0.00–1.00)
Neutrophils %: 49.6 % (ref 32.0–75.0)
Neutrophils Absolute: 2.13 K/UL (ref 1.80–8.00)
Nucleated RBCs: 0 /100{WBCs}
Platelets: 264 K/uL (ref 150–400)
RBC: 5.1 M/uL (ref 3.80–5.20)
RDW: 16.7 % — ABNORMAL HIGH (ref 11.5–14.5)
nRBC: 0 K/uL (ref 0.00–0.01)

## 2024-05-19 LAB — BASIC METABOLIC PANEL W/ REFLEX TO MG FOR LOW K
Anion Gap: 11 mmol/L (ref 2–14)
BUN/Creatinine Ratio: 15 (ref 12–20)
BUN: 13 mg/dL (ref 8–23)
CO2: 23 mmol/L (ref 20–29)
Calcium: 9.2 mg/dL (ref 8.2–9.6)
Chloride: 105 mmol/L (ref 98–107)
Creatinine: 0.82 mg/dL (ref 0.60–1.00)
Est, Glom Filt Rate: 64 ml/min/1.73m2 (ref >59)
Potassium: 4.5 mmol/L (ref 3.5–5.1)
Sodium: 139 mmol/L (ref 136–145)

## 2024-05-19 MED FILL — LOVENOX 40 MG/0.4ML IJ SOSY: 40 MG/0.4ML | INTRAMUSCULAR | Qty: 0.4

## 2024-05-19 MED FILL — PREGABALIN 100 MG PO CAPS: 100 mg | ORAL | Qty: 2

## 2024-05-19 MED FILL — DULOXETINE HCL 30 MG PO CPEP: 30 mg | ORAL | Qty: 2

## 2024-05-19 MED FILL — ROPINIROLE HCL 0.25 MG PO TABS: 0.25 mg | ORAL | Qty: 1

## 2024-05-19 MED FILL — ARICEPT 5 MG PO TABS: 5 mg | ORAL | Qty: 2

## 2024-05-19 MED FILL — VALSARTAN 160 MG PO TABS: 160 mg | ORAL | Qty: 1

## 2024-05-19 MED FILL — DULOXETINE HCL 30 MG PO CPEP: 30 mg | ORAL | Qty: 1

## 2024-05-19 MED FILL — CETIRIZINE HCL 10 MG PO TABS: 10 mg | ORAL | Qty: 1

## 2024-05-19 MED FILL — ACETAMINOPHEN 325 MG PO TABS: 325 mg | ORAL | Qty: 2

## 2024-05-19 NOTE — Progress Notes (Signed)
 "  Physician Progress Note      [name], [name]  CSN #:                  [id number]  DOB:                       November 07, 1923  ADMIT DATE:       05/15/2024 11:06 PM  DISCH DATE:  RESPONDING  PROVIDER #:        Idalia Lay MD          QUERY TEXT:    Acute respiratory failure is documented in the medical record 10/13 pulmonary   Kimberly Saxon, MD Please provide additional clinical indicators supportive of   your documentation. Or please document if the diagnosis of acute respiratory   failure has been ruled out after study.    The clinical indicators include:  10/11 H/P Kimberly Roma GAILS, MD  Chronic hypoxic RF, uses prn O2 suppl  Atelectasis  Subpleural fibrotic changes left upper lobe  Imaging reviewed and discussed with family at bedside  No respiratory symptoms currently.  She appears stable on 2LNC.  We were not   able to capture any hypoxia here  [id number], she does not appear volume overloaded      10/13 pulmonary Kimberly Saxon, MD  Acute hypoxemic respiratory failure.  Left upper lobe subpleural fibrotic changes.  Exertional dyspnea.    10/10 flowsheet  resp rate 24 33 32 29  sats   93 93 93      TX  supplemental oxygen @ 2lts/min  pulmonary consult  albuterol   duoneb   Options provided:  -- Acute Respiratory Failure ruled out after study  -- Acute Respiratory Failure as evidenced by, Please document evidence.  -- Acute Respiratory Failure ruled out after study and Chronic Respiratory   Failure confirmed  -- Other - I will add my own diagnosis  -- Disagree - Not applicable / Not valid  -- Refer to Clinical Documentation Reviewer    PROVIDER RESPONSE TEXT:    Acute Respiratory Failure ruled out after study and Chronic Respiratory   Failure confirmed.    Query created by: Harvey Browning on 05/19/2024 9:33 AM      QUERY TEXT:    Dementia is documented in the medical record 10/11 H/P Kimberly Roma GAILS, MD. Please   specify the severity and any associated behavioral disturbances:    The clinical indicators  include:  10/11 H/P Kimberly Roma GAILS, MD  PMHx significant for dementia    10/12 nurses note Kimberly Marelyn PARAS, RN  Patient confused and uncooperative, attempting to get out of bed, stating she   needs to go home. Patient pulled out IV and refused to take night medications.   Perfect served on call hospitalist; order given for PRN IM haldol . Haldol    given per order. Patient's niece now present at the bedside and agreed to   haldol  being given due to patient's uncooperativeness with her as well.    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

## 2024-05-19 NOTE — Progress Notes (Signed)
"  End of Shift Note    Bedside shift change report given to Katelyn, CHARITY FUNDRAISER (oncoming nurse) by Karna Simpers, RN (offgoing nurse).  Report included the following information SBAR, Kardex, Intake/Output, and MAR    Shift worked:  1900-0730     Shift summary and any significant changes:     Medications administered per [facility], education provided.  Patient resting well this shift, no episodes of agitation/anxiety.  Patient repositioned per request,  Patient without IV access, telemetry off due to patient pulling tele box off.  Patient incontinent of bowel and bladder this shift.    Caring rounds completed.     Concerns for physician to address:  Patient will not keep tele box on.  Do you wish to continue tele orders?     Zone phone for oncoming shift:          Activity:  Level of Assistance: Moderate assist, patient does 50-74%    Cardiac:   Cardiac Monitoring:  no    Access:  Current line(s): Other none, patient pulls out IV access    Genitourinary:   Urinary Status: Voiding, Diaper    Respiratory:   O2 Device: Nasal cannula    GI:  Current diet: ADULT ORAL NUTRITION SUPPLEMENT; Breakfast, Lunch, Dinner; Educational Psychologist Protein Oral Supplement  ADULT DIET; Dysphagia - Soft and Bite Sized; Low Fat/Low Chol/High Fiber/NAS    Pain Management:   Patient states pain is manageable on current regimen: YES    Skin:  Braden Scale Score: 19  Interventions: Wound Offloading (Prevention Methods): Bed, pressure reduction mattress, Elevate heels, Pillows, Repositioning  Pressure injury: no    Patient Safety:  Fall Score: Morse Total Score: 80  Fall Risk Interventions  Nursing Judgement-Fall Risk High(Add Comments): Yes  Toilet Every 2 Hours-In Advance of Need: Yes  Hourly Visual Checks: In bed  Fall Visual Posted: Socks  Room Door Open: Deferred to promote rest  Patient Moved Closer to Nursing Station: No    Active Consults:  IP CONSULT TO SOCIAL WORK  IP CONSULT TO PULMONOLOGY    Length of Stay:  Expected LOS: 4  Actual LOS:  3      Karna Simpers, RN    "

## 2024-05-19 NOTE — Progress Notes (Signed)
 "      Hospitalist Progress Note    NAME:   Kimberly [name]   DOB: October 28, 1923   MRN: [id number]     Date/Time: 05/19/2024 8:57 AM  Patient PCP: Kennie Darin MATSU, MD    Edd: 10/16  Barriers: needs PT OT eval     Assessment / Plan:  Chronic hypoxic RF, uses prn O2 suppl 2 L  Atelectasis   Subpleural fibrotic changes left upper lobe   Patient currently admitted to medical unit with telemetry  CTA chest on 05/16/2024-bilateral lower lobe atelectasis subpleural fibrotic changes left upper lobe potentially treatment related.  No evidence of pulmonary embolism.  No respiratory symptoms currently.  She appears stable on 2LNC.  We were not able to capture any hypoxia here  [id number], she does not appear volume overloaded  RVP negative, procal 0.08  Nebs prn  O2 suppl- currently at baseline   Pulmonology was consulted-advised incentive spirometer.  PT OT eval.  No further workup ordered by pulmonology at this moment.  Pt eval ordered     Neuropathy  RLS  HTN  Cont'  lyrica , losartan, requip  and cymbalta      Hyperkalemia  S/p Lokelma  x1  K wnl today     History of breast cancer status postlumpectomy left side    Medical Decision Making:   I personally reviewed labs: CBC BMP  I personally reviewed imaging: CTA chest on 05/16/2024-bilateral lower lobe atelectasis subpleural fibrotic changes left upper lobe potentially treatment related.  No evidence of pulmonary embolism.  Toxic drug monitoring: CBC on Lovenox   I personally reviewed :none   Discussed case with: RN,pt     Code Status: DNR  DVT Prophylaxis: lovenox     Subjective:   Follow-up acute respiratory failure    Labs and charts reviewed and patient examined . She appeared to be comfortable and sitting up in bed eating her breakfast .       Objective:     VITALS:   Last 24hrs VS reviewed since prior progress note. Most recent are:  Patient Vitals for the past 24 hrs:   BP Temp Temp src Pulse Resp SpO2   05/19/24 0753 124/67 98.2 F (36.8 C) Oral 69 14 97 %   05/19/24 0245 --  -- -- -- 20 --   05/18/24 1942 130/71 98.8 F (37.1 C) Oral 88 20 96 %   05/18/24 1000 (!) 170/86 98.4 F (36.9 C) Oral 88 -- 95 %         Intake/Output Summary (Last 24 hours) at 05/19/2024 0857  Last data filed at 05/19/2024 0600  Gross per 24 hour   Intake 560 ml   Output --   Net 560 ml          I had a face to face encounter and independently examined this patient on 05/19/2024, as outlined below:  PHYSICAL EXAM:  General: Alert, cooperative . On o2 via nasal cannula   EENT:  EOMI. Anicteric sclerae.  Resp:  CTA bilaterally, no wheezing or rales.  No accessory muscle use  CV:  Regular  rhythm,  No edema  GI:  Soft, Non distended, Non tender.  +BS  Neurologic:  Alert and oriented X 1, normal speech, able to move her extremities - power 5/5 in major muscle groups   Skin:  No rashes.  No jaundice    Reviewed most current lab test results and cultures  YES  Reviewed most current radiology test results   YES  Review  and summation of old records today    NO  Reviewed patient's current orders and MAR    YES  PMH/SH reviewed - no change compared to H&P      Procedures: see electronic medical records for all procedures/Xrays and details which were not copied into this note but were reviewed prior to creation of Plan.      LABS:  I reviewed today's most current labs and imaging studies.  Pertinent labs include:  Recent Labs     05/17/24  0549 05/18/24  0606 05/19/24  0429   WBC 3.3* 5.1 4.3   HGB 10.6* 12.2 11.4*   HCT 35.4 39.2 37.1   PLT 241 256 264     Recent Labs     05/17/24  0549 05/18/24  0606 05/19/24  0429   NA 139 141 139   K 4.3 4.5 4.5   CL 107 105 105   CO2 22 25 23    GLUCOSE 99 98 85   BUN 16 11 13    CREATININE 0.82 0.72 0.82   CALCIUM  9.1 9.6 9.2       Signed: Idalia Lay, MD     Total time spent caring for the patient: >35 minutes      "

## 2024-05-19 NOTE — Progress Notes (Signed)
"  End of Shift Note    Bedside shift change report given to Karna, CHARITY FUNDRAISER (cabin crew) by Darryle Noel (offgoing nurse).  Report included the following information SBAR, Kardex, Intake/Output, MAR, and Recent Results    Shift worked:  0700-1930     Shift summary and any significant changes:    Patient tolerated care fairly well. Medications administered per [facility] and education provided. Hourly rounding complete. Pt complaint of pain treated with PRN Tylenol  x1. Family at bedside. Patient had BM x1. Pt on 2L nasal cannula.      Concerns for physician to address:  N/a     Zone phone for oncoming shift:   [phone]       Activity:  Level of Assistance: Maximum assist, patient does 25-49%  Number times ambulated in hallways past shift: 0  Number of times OOB to chair past shift: 0    Cardiac:   Cardiac Monitoring: Refused      Cardiac Rhythm:  (Pt removed telemetry box and electrodes)    Access:  Current line(s): Other pt has removed PIVs    Genitourinary:   Urinary Status: Voiding, External catheter    Respiratory:   O2 Device: Nasal cannula  Chronic home O2 use?: NO  Incentive spirometer at bedside: YES    GI:  Last BM (including prior to admit): 05/18/24  Current diet:  ADULT ORAL NUTRITION SUPPLEMENT; Breakfast, Lunch, Dinner; Educational Psychologist Protein Oral Supplement  ADULT DIET; Dysphagia - Soft and Bite Sized; Low Fat/Low Chol/High Fiber/NAS; No Pork  Passing flatus: YES    Pain Management:   Patient states pain is manageable on current regimen: YES    Skin:  Braden Scale Score: 14  Interventions: Wound Offloading (Prevention Methods): Bed, pressure reduction mattress, Elevate heels, Pillows, Repositioning, Turning    Patient Safety:  Fall Risk: Nursing Judgement-Fall Risk High(Add Comments): Yes  Fall Risk Interventions  Nursing Judgement-Fall Risk High(Add Comments): Yes  Toilet Every 2 Hours-In Advance of Need: Yes  Hourly Visual Checks: In bed, Eyes closed  Fall Visual Posted: Fall sign posted, Socks  Room  Door Open: Yes  Patient Moved Closer to Nursing Station: No    Active Consults:   IP CONSULT TO SOCIAL WORK  IP CONSULT TO PULMONOLOGY    Length of Stay:  Expected LOS: 5  Actual LOS: 3    [employer]

## 2024-05-19 NOTE — Plan of Care (Signed)
 "  Problem: Physical Therapy - Adult  Goal: By Discharge: Performs mobility at highest level of function for planned discharge setting.  See evaluation for individualized goals.  Description: FUNCTIONAL STATUS PRIOR TO ADMISSION: Per chart patient was ambulatory with a RW and had support from niece.  She is a poor historian today and unable to provide much PLOF    HOME SUPPORT PRIOR TO ADMISSION: The patient lived with neice.    Physical Therapy Goals  Initiated 05/19/2024  1.  Patient will move from supine to sit and sit to supine in bed with modified independence within 7 day(s).    2.  Patient will perform sit to stand with minimal assistance within 7 day(s).  3.  Patient will transfer from bed to chair and chair to bed with minimal assistance using the least restrictive device within 7 day(s).  4.  Patient will ambulate with minimal assistance for 10 feet with the least restrictive device within 7 day(s).     Outcome: Progressing   PHYSICAL THERAPY EVALUATION    Patient: Kimberly [name] (89 y.o. female)  Date: 05/19/2024  Primary Diagnosis: Acute hypoxemic respiratory failure (HCC) [J96.01]  Acute hypoxic respiratory failure (HCC) [J96.01]       Precautions: Restrictions/Precautions  Restrictions/Precautions: Fall Risk            ASSESSMENT :   DEFICITS/IMPAIRMENTS:   The patient is limited by impaired balance, gait instability, confusion, generalized weakness and decreased activity tolerance.  Currently patient is very sleepy and requires max cues to wake up.  Once alert requires minA to come to the EOB (mainly for LE management).  Sit to stand with minA x 2 but requires modA to maintain standing and displays poor upright balance with posterior lean. Able to take  a few steps with RW but balance was too poor to advance safely to chair.      Based on the impairments listed above patient appears below her functional baseline.  Recommend moderate level rehab to continue mobility progression unless family feels that  they are able to provide level of care she needs at home.    Patient will benefit from skilled intervention to address the above impairments.         PLAN :  Recommendations and Planned Interventions:   bed mobility training, transfer training, gait training, therapeutic exercises, neuromuscular re-education, patient and family training/education, and therapeutic activities    Frequency/Duration: Patient will be followed by physical therapy to address goals, PT Plan of Care: 5 times/week to address goals.        Recommendation for discharge: (in order for the patient to meet his/her long term goals):   Moderate intensity short-term skilled physical therapy up to 5x/week    Other factors to consider for discharge: patient's current support system is unable to meet their requirements for physical assistance, high risk for falls, not safe to be alone, and concern for safely navigating or managing the home environment    IF patient discharges home will need the following DME: continuing to assess with progress                SUBJECTIVE:   Patient stated I am doing better.    OBJECTIVE DATA SUMMARY:       Past Medical History:   Diagnosis Date    Acute CVA (cerebrovascular accident) (HCC) 09/12/2022    Adverse effect of anesthesia     slow to wake    Anginal pain  Arthritis     Breast cancer (HCC) 03/10/2018     left lumpectomy    Cancer (HCC) 01/2018    breast    Chronic pain     neuropathy    Constipation     GERD (gastroesophageal reflux disease)     Headache     Hearing loss     High cholesterol     Hypertension     Incontinence     Joint pain     Memory disorder     Muscle pain     Osteoporosis     Ringing in the ears     Snoring     Visual disturbance      Past Surgical History:   Procedure Laterality Date    BREAST LUMPECTOMY Left 03/10/2018    LEFT BREAST LUMPECTOMY WITH ULTRASOUND GUIDANCE performed by Lucy Ozell PARAS, MD at [facility]    HYSTERECTOMY (CERVIX STATUS UNKNOWN)      ORTHOPEDIC  SURGERY      epidural pain mtg - pt unsure    OTHER SURGICAL HISTORY Right 10/15/2016    ligament correction right foot    OTHER SURGICAL HISTORY  11/26/2016    Pituitary tumor removal    US  BREAST BIOPSY W LOC DEVICE 1ST LESION LEFT Left 01/21/2018    US  BREAST NEEDLE BIOPSY LEFT 01/21/2018 [facility] US        Home Situation:  Social/Functional History  Lives With: Other (Comment) (niece)    Cognitive/Behavioral Status:  Orientation  Orientation Level: Oriented to person;Disoriented to time;Disoriented to situation;Disoriented to place  Cognition  Overall Cognitive Status: Exceptions  Arousal/Alertness: Appears intact (after woke up)  Following Commands: Impaired;Follows one step commands with repetition  Attention Span: Impaired;Difficulty dividing attention;Difficulty attending to directions  Memory: Impaired  Safety Judgement: Impaired  Problem Solving: Impaired;Decreased awareness of errors  Insights: Impaired  Initiation: Impaired  Sequencing: Impaired        Vision/Perceptual:    Vision - Basic Assessment  Prior Vision: Wears glasses all the time  Vision Comments: glasses present and donned with total assist        Perception  Overall Perceptual Status: WFL        Strength:    Strength: Generally decreased, functional    Tone & Sensation:   Tone: Normal  Sensation: Intact    Coordination:  Coordination: Generally decreased, functional    Range Of Motion:  AROM: Generally decreased, functional  PROM: Generally decreased, functional    Functional Mobility:  Bed Mobility:     Bed Mobility Training  Bed Mobility Training: Yes  Interventions: Safety awareness training;Verbal cues;Visual cues  Supine to Sit: Minimal assistance (request assist LE's)  Sit to Supine: Minimal assistance  Scooting: Contact guard assistance  Transfers:     Transfer Training  Transfer Training: Yes  Interventions: Safety awareness training;Verbal cues;Manual cues;Weight shifting training/pressure relief  Sit to Stand: 2 Person  assistance;Partial/Moderate assistance  Stand to Sit: Partial/Moderate assistance;2 Person assistance  Stand Pivot Transfers: Partial/Moderate assistance;2 Person assistance  Balance:               Balance  Sitting: Impaired  Sitting - Static: Good (unsupported)  Sitting - Dynamic: Fair (occasional)  Standing: Impaired;With support  Standing - Static: Constant support;Poor (posterior lean)  Standing - Dynamic: Poor  Ambulation/Gait Training:                       Gait  Gait Training: Yes  Overall Level of Assistance: Partial/Moderate assistance;2 Person assistance  Distance (ft): 4 Feet (sidesteps)  Assistive Device: Gait belt;Prosthetic device;Walker, rolling  Base of Support: Widened  Speed/Cadence: Slow  Gait Abnormalities: Decreased step clearance (posterior lean in standing, poor foot clearance)                             Treatment Rendered:  Yes   Patient was provided gait training this session to include verbal and tactile cues for gait mechanics and safe management of RW to decrease risk for falls.  Physical assistance was provided to provide stability as well as to facilitate proper gait mechanics.          Pain Rating:  0/10   Pain Intervention(s):       Activity Tolerance:   Fair  and requires rest breaks    After treatment:   Patient left in no apparent distress in bed, Call bell within reach, Bed/ chair alarm activated, and Side rails x3    COMMUNICATION/EDUCATION:   The patient's plan of care was discussed with: physical therapist, occupational therapist, and registered nurse         Thank you for this referral.  Lauraine FORBES [place], PT, DPT  Minutes: 15        "

## 2024-05-19 NOTE — Plan of Care (Signed)
 "  Problem: Occupational Therapy - Adult  Goal: By Discharge: Performs self-care activities at highest level of function for planned discharge setting.  See evaluation for individualized goals.  Description: FUNCTIONAL STATUS PRIOR TO ADMISSION:  per chart review patient was ambulatory with rolling walker, patient poor historian, reported she lives with her niece, per student nurse her niece is a licensed conveyancer, noted hx of hospice however graduated from hospice.    ,  ,  ,  ,  ,  ,  ,  ,  ,  ,       HOME SUPPORT: Patient lived with her niece.    Occupational Therapy Goals:  Initiated 05/19/2024  1.  Patient will perform grooming and UE ADL's sated edge of bed with Set-up and Supervision within 7 day(s).  2.  Patient will perform static standing with UE support on rolling walker > or = 2 minutes with  Minimal Assist within 7 day(s).  3.  Patient will perform toilet transfers with Minimal Assist using rolling walker within 7 day(s).  Outcome: Progressing     OCCUPATIONAL THERAPY EVALUATION    Patient: Kimberly [name] (89 y.o. female)  Date: 05/19/2024  Primary Diagnosis: Acute hypoxemic respiratory failure (HCC) [J96.01]  Acute hypoxic respiratory failure (HCC) [J96.01]         Precautions:                    ASSESSMENT :  The patient is limited by decreased functional mobility, independence in ADLs, activity tolerance, safety awareness, cognition, command following, balance.    Based on the impairments listed above patient requiring increased assist for functional transfers, poor static standing balance with posterior lean standing edge of bed with rolling walker support. At this time recommend assist of 2 for transfers, however patient woken up for therapy and may do better later in day. Received on 2 liters and oxygen sats stable on 2 liters. Patient oriented to self and reported living with her niece, unclear if she has 24/7 assist at home.       PLAN :  Recommendations and Planned Interventions:   self care  training, therapeutic activities, functional mobility training, balance training, therapeutic exercise, endurance activities, cognitive retraining, patient education, home safety training, and family training/education    Frequency/Duration: OT Plan of Care: 5 times/week    Recommendations for mobility with staff: Recommend that staff completes patient mobility with assist x2 using gait belt and rolling walker.    Recommendations for toileting with staff:  recommended toilet device: a bedside commode.     Recommendation for discharge: (in order for the patient to meet his/her long term goals):   Moderate intensity short-term skilled occupational therapy up to 5x/week versus home with increased assist    Other factors to consider for discharge: impaired cognition, high risk for falls, and concern for safely navigating or managing the home environment    IF patient discharges home will need the following DME: continuing to assess with progress       SUBJECTIVE:   Patient stated, I'm visiting. stated when asked where she was  OBJECTIVE DATA SUMMARY:     Past Medical History:   Diagnosis Date    Acute CVA (cerebrovascular accident) (HCC) 09/12/2022    Adverse effect of anesthesia     slow to wake    Anginal pain     Arthritis     Breast cancer (HCC) 03/10/2018     left lumpectomy    Cancer (  HCC) 01/2018    breast    Chronic pain     neuropathy    Constipation     GERD (gastroesophageal reflux disease)     Headache     Hearing loss     High cholesterol     Hypertension     Incontinence     Joint pain     Memory disorder     Muscle pain     Osteoporosis     Ringing in the ears     Snoring     Visual disturbance      Past Surgical History:   Procedure Laterality Date    BREAST LUMPECTOMY Left 03/10/2018    LEFT BREAST LUMPECTOMY WITH ULTRASOUND GUIDANCE performed by Lucy Ozell PARAS, MD at [facility]    HYSTERECTOMY (CERVIX STATUS UNKNOWN)      ORTHOPEDIC SURGERY      epidural pain mtg - pt unsure    OTHER  SURGICAL HISTORY Right 10/15/2016    ligament correction right foot    OTHER SURGICAL HISTORY  11/26/2016    Pituitary tumor removal    US  BREAST BIOPSY W LOC DEVICE 1ST LESION LEFT Left 01/21/2018    US  BREAST NEEDLE BIOPSY LEFT 01/21/2018 [facility] US           Expanded or extensive additional review of patient history:   Social/Functional History  Lives With: Other (Comment) (niece)      Hand Dominance:      EXAMINATION OF PERFORMANCE DEFICITS:    Cognitive/Behavioral Status:  Orientation  Orientation Level: Oriented to person;Disoriented to time;Disoriented to situation;Disoriented to place  Cognition  Overall Cognitive Status: Exceptions  Arousal/Alertness: Appears intact (after woke up)  Following Commands: Impaired;Follows one step commands with repetition  Attention Span: Impaired;Difficulty dividing attention;Difficulty attending to directions  Memory: Impaired  Safety Judgement: Impaired  Problem Solving: Impaired;Decreased awareness of errors  Insights: Impaired  Initiation: Impaired  Sequencing: Impaired    Skin: not formally assessed     Edema: none noted    Hearing:    Impaired, hearing aides charging in room, able to follow commands without hearing aides    Vision/Perceptual:    Vision - Basic Assessment  Prior Vision: Wears glasses all the time  Vision Comments: glasses present and donned with total assist        Perception  Overall Perceptual Status: WFL            Range of Motion:   AROM: Generally decreased, functional         Strength:  Strength: Generally decreased, functional      Coordination:  Coordination: Generally decreased, functional            Tone & Sensation:   Tone: Normal             Functional Mobility and Transfers for ADLs:    Bed Mobility:     Bed Mobility Training  Bed Mobility Training: Yes  Interventions: Safety awareness training;Verbal cues;Visual cues  Supine to Sit: Minimal assistance (request assist LE's)  Sit to Supine: Minimal assistance  Scooting: Contact guard  assistance    Transfers:      Transfer Training  Transfer Training: Yes  Interventions: Safety awareness training;Verbal cues;Manual cues;Weight shifting training/pressure relief  Sit to Stand: 2 Person assistance;Partial/Moderate assistance  Stand to Sit: Partial/Moderate assistance;2 Person assistance  Stand Pivot Transfers: Partial/Moderate assistance;2 Person assistance          Functional Mobility: Maximum assistance  Balance:      Balance  Sitting: Impaired  Sitting - Static: Good (unsupported)  Sitting - Dynamic: Fair (occasional)  Standing: Impaired;With support  Standing - Static: Constant support;Poor (posterior lean)  Standing - Dynamic: Poor      ADL Assessment:          Feeding: Setup;Verbal cueing;Supervision       Grooming: Minimal assistance  Grooming Skilled Clinical Factors: seated edge of bed       UE Dressing: Moderate assistance       LE Dressing: Maximum assistance       Toileting: Dependent/Total            Functional Mobility: Maximum assistance  Functional Mobility Skilled Clinical Factors: posterior lean standing with rolling walker, side step with mod assist of 2             Treatment Rendered:    Yes educated on role of OT, re-oriented to place, situation time      Set-up for breakfast in bed with head of bed elevated and cues to initiate, continued eating using spoon after assisted to initiate and tray set-up    Patient educated on safe transfer techniques, with specific emphasis on proper hand placement to push up from seated surface rather than attempt to pull self up, fully positioning self in-front of desired seated location, feeling chair on back of legs and reaching back with 1-2 UE to slowly lower self to seated position.  Minimal carryover and continued education indicated      Pain Rating:  0/10   Pain Intervention(s):   pain is at a level acceptable to the patient    Activity Tolerance:   Fair  and desaturates with activity and requires oxygen    After treatment:    Patient left in no apparent distress in bed, Call bell within reach, Bed/ chair alarm activated, and Heels elevated for pressure relief    COMMUNICATION/EDUCATION:   The patient's plan of care was discussed with: physical therapist and registered nurse    Patient Education  Education Given To: Patient  Education Provided: Role of Therapy;Orientation;Transfer Training (use of call bell)  Education Method: Demonstration;Verbal  Barriers to Learning: Cognition;Hearing  Education Outcome: Verbalized understanding;Continued education needed    Thank you for this referral.  Olam CHRISTELLA Parson, OTR/L  Minutes: 17  "

## 2024-05-20 MED FILL — PREGABALIN 100 MG PO CAPS: 100 mg | ORAL | Qty: 2

## 2024-05-20 MED FILL — ROPINIROLE HCL 0.25 MG PO TABS: 0.25 mg | ORAL | Qty: 1

## 2024-05-20 MED FILL — DULOXETINE HCL 30 MG PO CPEP: 30 mg | ORAL | Qty: 2

## 2024-05-20 MED FILL — LOVENOX 40 MG/0.4ML IJ SOSY: 40 MG/0.4ML | INTRAMUSCULAR | Qty: 0.4

## 2024-05-20 MED FILL — VALSARTAN 160 MG PO TABS: 160 mg | ORAL | Qty: 1

## 2024-05-20 MED FILL — CETIRIZINE HCL 10 MG PO TABS: 10 mg | ORAL | Qty: 1

## 2024-05-20 MED FILL — ACETAMINOPHEN 325 MG PO TABS: 325 mg | ORAL | Qty: 2

## 2024-05-20 NOTE — Progress Notes (Signed)
"  I have reviewed discharge instructions with the patient and Angeline (caregiver).   The patient and Regina(caregiver) verbalized understanding.   Discharge medications reviewed with patient and Regina(caregiver) and appropriate educational materials and side effects teaching were provided.   Follow-up appointments reviewed.   Opportunity for questions and clarification was provided.    Patient's belongings gathered and sent with patient.   Patient is ready for discharge.   Patient taken to the main hospital entrance for discharge.     Yanelie Abraha, RN    "

## 2024-05-20 NOTE — Progress Notes (Signed)
"  Occupational Therapy  Attempted OT. Pt declined and reported feeling tired after working with physical therapy earlier. RN just helped pt back to bed from recliner. Agreeable to OT tomorrow.     Follow up as appropriate.   "

## 2024-05-20 NOTE — Progress Notes (Signed)
"  Pulse oximetry assessment   92% at rest on room air (if 88% or less, skip next steps)  89% while semi fowlers on room air  97% at rest on 2 LPM      "

## 2024-05-20 NOTE — Plan of Care (Signed)
 "  Problem: Safety - Adult  Goal: Free from fall injury  05/20/2024 1544 by Deloria Phenes, RN  Outcome: Progressing  Flowsheets (Taken 05/20/2024 0300 by Silver Aquas, RN)  Free From Fall Injury: Instruct family/caregiver on patient safety  05/20/2024 0241 by Silver Aquas, RN  Outcome: Progressing  Flowsheets (Taken 05/19/2024 1940)  Free From Fall Injury: Instruct family/caregiver on patient safety     Problem: Chronic Conditions and Co-morbidities  Goal: Patient's chronic conditions and co-morbidity symptoms are monitored and maintained or improved  05/20/2024 1544 by Deloria Phenes, RN  Outcome: Progressing  05/20/2024 0241 by Silver Aquas, RN  Outcome: Progressing  Flowsheets (Taken 05/19/2024 1940)  Care Plan - Patient's Chronic Conditions and Co-Morbidity Symptoms are Monitored and Maintained or Improved: Monitor and assess patient's chronic conditions and comorbid symptoms for stability, deterioration, or improvement     Problem: ABCDS Injury Assessment  Goal: Absence of physical injury  05/20/2024 1544 by Deloria Phenes, RN  Outcome: Progressing  Flowsheets (Taken 05/20/2024 0300 by Silver Aquas, RN)  Absence of Physical Injury: Implement safety measures based on patient assessment  05/20/2024 0241 by Silver Aquas, RN  Outcome: Progressing  Flowsheets (Taken 05/19/2024 1940)  Absence of Physical Injury: Implement safety measures based on patient assessment     Problem: Pain  Goal: Verbalizes/displays adequate comfort level or baseline comfort level  05/20/2024 1544 by Deloria Phenes, RN  Outcome: Progressing  05/20/2024 0241 by Silver Aquas, RN  Outcome: Progressing  Flowsheets (Taken 05/19/2024 1940)  Verbalizes/displays adequate comfort level or baseline comfort level: Assess pain using appropriate pain scale     Problem: Skin/Tissue Integrity  Goal: Skin integrity remains intact  Description: 1.  Monitor for areas of redness and/or skin breakdown  2.  Assess vascular access sites hourly  3.   Every 4-6 hours minimum:  Change oxygen saturation probe site  4.  Every 4-6 hours:  If on nasal continuous positive airway pressure, respiratory therapy assess nares and determine need for appliance change or resting period  05/20/2024 1544 by Deloria Phenes, RN  Outcome: Progressing  Flowsheets  Taken 05/20/2024 0812 by Deloria Phenes, RN  Skin Integrity Remains Intact: Monitor for areas of redness and/or skin breakdown  Taken 05/20/2024 0300 by Silver Aquas, RN  Skin Integrity Remains Intact: Monitor for areas of redness and/or skin breakdown  05/20/2024 0241 by Silver Aquas, RN  Outcome: Progressing  Flowsheets (Taken 05/19/2024 1940)  Skin Integrity Remains Intact: Monitor for areas of redness and/or skin breakdown     Problem: Physical Therapy - Adult  Goal: By Discharge: Performs mobility at highest level of function for planned discharge setting.  See evaluation for individualized goals.  Description: FUNCTIONAL STATUS PRIOR TO ADMISSION: Per chart patient was ambulatory with a RW and had support from niece.  She is a poor historian today and unable to provide much PLOF    HOME SUPPORT PRIOR TO ADMISSION: The patient lived with neice.    Physical Therapy Goals  Initiated 05/19/2024  1.  Patient will move from supine to sit and sit to supine in bed with modified independence within 7 day(s).    2.  Patient will perform sit to stand with minimal assistance within 7 day(s).  3.  Patient will transfer from bed to chair and chair to bed with minimal assistance using the least restrictive device within 7 day(s).  4.  Patient will ambulate with minimal assistance for 10 feet with the least restrictive device within 7 day(s).  05/20/2024 1405 by Sidra Raisin, PT  Outcome: Progressing     "

## 2024-05-20 NOTE — Care Coordination (Signed)
"  Care Management Initial Assessment       RUR: 15%  Readmission? No  1st IM letter given? Yes   1st [employer] letter given: No    CM completed assessment with pts niece: Kimberly [name].  CM informed that pt resides with family in their one story home.  Pt is active with PCP and use [facility].  Pt is known to need assistance with ADLs and doesn't drive.  Pt has DME at home: rollator, hospital bed, and concentrator for O2 (as needed).  Pt is currently open to [facility] provided by [facility] and no reports of SNF.    CM informed Kimberly [name] of recommendations of snf, and would like to speak with her family first regarding the following.  Kimberly [name] and/or family member will contact CM once complete.    Yolande Margo, MSW [facility]  [phone]       05/20/24 0946   Service Assessment   Information Provided By Child/Family   Patient Orientation Unable to Assess   Cognition Dementia / Early Alzheimer's   Primary Caregiver Family   Support Systems Family Members   Patient's Healthcare Decision Maker is: Named in Scanned ACP Document   PCP Verified by CM Yes   Last Visit to PCP Within last 3 months   Prior Functional Level Assistance with the following:;Bathing;Dressing;Toileting;Mobility;Shopping;Housework;Cooking;Feeding   Current Functional Level at Time of Initial Assessment Assistance with the following:;Bathing;Dressing;Toileting;Mobility;Feeding   Can patient return to prior living arrangement Yes   Ability to make needs known: Poor   Family able to assist with home care needs: Yes   Receives Help From Family   Social/Functional History   Active Driver No   Mode of Transportation Family;Car   Discharge Planning    Location Prior to Acute Admission House   Lives With Family   Patient expects to be discharged to: Home       "

## 2024-05-20 NOTE — Progress Notes (Signed)
 "     Pulmonary Progress Note    Requesting Provider: Dr Belma     Reason for Consult: Respiratory failure.      Name: Kimberly [name]   DOB: November 03, 1923   MRN: [id number]   Date: 05/20/2024            Assessment:     - Acute hypoxemic respiratory failure.  - Left upper lobe subpleural fibrotic changes.  - Exertional dyspnea.    Plan:     -I independently reviewed CT chest, shows focal area of left upper lobe subpleural fibrotic changes, this is small.  Not significant enough to pursue any workup.  She also does not have significant hypoxemia.  Her CT chest does show some bibasilar atelectasis, she has IS bedside.   -PT/OT.  Part of her exertional dyspnea could be related to deconditioning as well.  -ambulatory pulse ox prior to dc, she was not previously on O2 and suspect does not need this     Discussed with family bedside     L2    HPI:     Kimberly [name] is a 89 year old female who presented to the emergency room on Friday with acute onset shortness of breath. The dyspnea occurs primarily with exertion, specifically when walking and trying to do activities. She is able to walk with the assistance of a walker and can walk distances such as from a parking lot to church without becoming short of breath, though going to the bathroom does not typically cause dyspnea. The shortness of breath was acute in onset, beginning on Friday, which prompted her emergency room visit. She has 2 lit oxygen at home which is uses PRN.    The patient denies associated cold symptoms including runny nose, runny eyes, or sore throat, though she reports some chronic runny nose that is not new. She denies cough, fever, and has no history of smoking, lung conditions, heart disease, pneumonia, or COVID-19. she appears much improved today and is currently on 2 liters of oxygen with sats of 96%.  Her CT chest showed focal area of left upper lobe subpleural fibrosis.  Pulmonary consult is requested for further evaluation.    Review of systems:    All system reviewed, and negative except mentioned in HPI.      Objective:     Vital Signs:  BP 127/63   Pulse 80   Temp 97.9 F (36.6 C) (Oral)   Resp 18   Ht 1.575 m (5' 2)   Wt 68.3 kg (150 lb 9.2 oz)   SpO2 97%   BMI 27.54 kg/m      Temp (24hrs), Avg:98.1 F (36.7 C), Min:97.9 F (36.6 C), Max:98.2 F (36.8 C)           Physical Exam:  GEN: Sitting comfortably on the bed, not in acute distress.   CV: S1S2   LUNGS: Bilateral air entry  EXT: No cyanosis  NEURO: Alert, awake and follows commands.    Past Medical History:        has a past medical history of Acute CVA (cerebrovascular accident) (HCC), Adverse effect of anesthesia, Anginal pain, Arthritis, Breast cancer (HCC), Cancer (HCC), Chronic pain, Constipation, GERD (gastroesophageal reflux disease), Headache, Hearing loss, High cholesterol, Hypertension, Incontinence, Joint pain, Memory disorder, Muscle pain, Osteoporosis, Ringing in the ears, Snoring, and Visual disturbance.    Past Surgical History:      has a past surgical history that includes US  BREAST BIOPSY W LOC DEVICE 1ST LESION  LEFT (Left, 01/21/2018); orthopedic surgery; other surgical history (Right, 10/15/2016); Hysterectomy; Breast lumpectomy (Left, 03/10/2018); and other surgical history (11/26/2016).      Home Medications:     Prior to Admission medications   Medication Sig Start Date End Date Taking? Authorizing Provider   pregabalin  (LYRICA ) 200 MG capsule TAKE 1 CAPSULE BY MOUTH TWICE DAILY. MAX DAILY AMOUNT: 400 MG 04/20/24 10/17/24 Yes Kennie Darin MATSU, MD   valsartan  (DIOVAN ) 160 MG tablet Take 1 tablet by mouth daily 03/30/24  Yes Enowtaku, Darin MATSU, MD   donepezil  (ARICEPT ) 10 MG tablet Take 1 tablet by mouth daily (before dinner) 03/30/24  Yes Claudene Debby LABOR, MD   DULoxetine  (CYMBALTA ) 30 MG extended release capsule Take 1 capsule by mouth daily (before dinner) 03/30/24  Yes Claudene Debby LABOR, MD   docusate sodium  (COLACE) 100 MG capsule Take 1 capsule by mouth daily  Patient  taking differently: Take 1 capsule by mouth 2 times daily 03/12/24  Yes Kennie Darin MATSU, MD   DULoxetine  (CYMBALTA ) 60 MG extended release capsule Take 1 capsule by mouth daily 03/08/24  Yes Claudene Debby LABOR, MD   albuterol  (PROVENTIL ) (2.5 MG/3ML) 0.083% nebulizer solution Take 3 mLs by nebulization 4 times daily as needed for Wheezing 02/21/24  Yes Kennie Darin MATSU, MD   cetirizine  (ZYRTEC ) 5 MG tablet Take 1 tablet by mouth daily 01/13/24  Yes Kennie Darin MATSU, MD   rOPINIRole  (REQUIP ) 0.25 MG tablet Take 1 tablet by mouth nightly 09/17/23  Yes Enowtaku, Darin MATSU, MD   Omega-3 Fatty Acids (FISH OIL) 1000 MG CPDR Take 3 capsules by mouth   Yes [provider]   chlorhexidine (PERIDEX) 0.12 % solution Swish and spit 15 mLs 2 times daily   Yes [provider]   vitamin E  1000 units capsule Take 1 capsule by mouth daily   Yes [provider]   Cholecalciferol (VITAMIN D3) 50 MCG (2000 UT) CAPS Take 1 capsule by mouth daily   Yes [provider]   ammonium lactate (LAC-HYDRIN) 12 % lotion Apply 1 Application topically as needed in the morning and 1 Application as needed in the evening. 03/29/22  Yes [provider]   ascorbic acid  (VITAMIN C ) 100 MG tablet Take 1 tablet by mouth daily   Yes [provider]   acetaminophen  (TYLENOL ) 500 MG tablet Take by mouth every 6 hours as needed for Pain   Yes Automatic Reconciliation, Ar   Zinc 30 MG CAPS Take by mouth    [provider]   carbamide peroxide (DEBROX) 6.5 % otic solution Place 5 drops in ear(s) 2 times daily  Patient not taking: Reported on 05/16/2024 09/16/22   [provider]         Allergies/Social/Family History:     Allergies   Allergen Reactions    Penicillins Hives and Myalgia    Sulfa Antibiotics Hives     Other reaction(s): Unknown (comments)      Social History     Tobacco Use    Smoking status: Never     Passive exposure: Never    Smokeless tobacco: Never   Substance Use Topics    Alcohol  use: Not Currently      Family History   Problem Relation Age of Onset    Dementia Sister     Dementia Brother     Cancer Daughter     Breast Cancer Daughter 73    Breast Cancer Sister 5    Breast  Cancer Mother 25    No Known Problems Father          LABS AND  DATA:   Reviewed        Dick GORMAN King, MD   Pulmonary Medicine  [place]  [facility]  [place]  [phone]  05/20/2024        "

## 2024-05-20 NOTE — Plan of Care (Signed)
 "  Problem: Physical Therapy - Adult  Goal: By Discharge: Performs mobility at highest level of function for planned discharge setting.  See evaluation for individualized goals.  Description: FUNCTIONAL STATUS PRIOR TO ADMISSION: Per chart patient was ambulatory with a RW and had support from niece.  She is a poor historian today and unable to provide much PLOF    HOME SUPPORT PRIOR TO ADMISSION: The patient lived with neice.    Physical Therapy Goals  Initiated 05/19/2024  1.  Patient will move from supine to sit and sit to supine in bed with modified independence within 7 day(s).    2.  Patient will perform sit to stand with minimal assistance within 7 day(s).  3.  Patient will transfer from bed to chair and chair to bed with minimal assistance using the least restrictive device within 7 day(s).  4.  Patient will ambulate with minimal assistance for 10 feet with the least restrictive device within 7 day(s).     Outcome: Progressing   PHYSICAL THERAPY TREATMENT    Patient: Kimberly [name] (89 y.o. female)  Date: 05/20/2024  Diagnosis: Acute hypoxemic respiratory failure (HCC) [J96.01]  Acute hypoxic respiratory failure (HCC) [J96.01] Acute hypoxemic respiratory failure (HCC)      Precautions: Restrictions/Precautions  Restrictions/Precautions: Fall Risk            ASSESSMENT:  Patient continues to benefit from skilled PT services and is slowly progressing towards goals. She is received supine in bed. Patients hearing aides are present but the battery is dead. She has a second pair that are placed in her ear, pair with dead battery are placed back on charger. Patient is provided up to Mod A for supine to sit transition. She demonstrates increased difficulty due to positioning in the bed. She requires Max A for sit to stand transition demonstrating marked posterior lean with narrow BOS. Patient is proivded Max A until she is able to grasp RW with bilateral hands and shift her weight anterior with increased BOS. Once  patient has her balance she demonstrates the need for Mod A for stand pivot transfer. VC are provided to keep RW with her throughout transfer and to take her time. As patient fatigues she attempts to rush to bedside chair increasing risk for falls. She is able to feed herself once setup in chair.         PLAN:  Patient continues to benefit from skilled intervention to address the above impairments.  Continue treatment per established plan of care.      Recommendations for mobility with staff: Recommend that staff completes patient mobility with assist x2 using gait belt and rolling walker.    Recommendations for toileting with staff:  recommended toilet device: a bedside commode, a bed pan, and a Purewick.       Recommendation for discharge: (in order for the patient to meet his/her long term goals):   Moderate intensity short-term skilled physical therapy up to 5x/week    Other factors to consider for discharge: patient's current support system is unable to meet their requirements for physical assistance, poor safety awareness, high risk for falls, not safe to be alone, and concern for safely navigating or managing the home environment    IF patient discharges home will need the following DME: continuing to assess with progress       SUBJECTIVE:   Patient stated, .    OBJECTIVE DATA SUMMARY:     Functional Mobility Training:  Bed Mobility:  Bed  Mobility Training  Sit to Supine: Minimal assistance  Scooting: Substantial/Maximal assistance  Transfers:  Transfer Training  Sit to Stand: Substantial/Maximal assistance  Stand to Sit: Minimal assistance  Stand Pivot Transfers: Partial/Moderate assistance  Balance:  Balance  Sitting: Impaired  Sitting - Static: Good (unsupported)  Sitting - Dynamic: Fair (occasional)  Standing: Impaired;With support  Standing - Static: Constant support;Fair;Poor  Standing - Dynamic: Constant support;Fair;Poor   Ambulation/Gait Training:     Gait  Gait Training: Yes  Overall Level of  Assistance: Partial/Moderate assistance  Distance (ft): 4 Feet  Step Length: Right shortened;Left shortened  Gait Abnormalities: Decreased step clearance       Activity Tolerance:   Fair     After treatment:   Patient left in no apparent distress sitting up in chair, Call bell within reach, and Bed/ chair alarm activated      COMMUNICATION/EDUCATION:   The patient's plan of care was discussed with: registered nurse           Harlene Pesa, PT  Minutes: 25    "

## 2024-05-20 NOTE — Progress Notes (Signed)
"  End of Shift Note    Bedside shift change report given to Joel, CHARITY FUNDRAISER (oncoming nurse) by Karna Simpers, RN (offgoing nurse).  Report included the following information SBAR, Kardex, Intake/Output, and MAR    Shift worked:  1900-0730     Shift summary and any significant changes:     Medications administered per [facility], education provided.  Warm pack and PRN acetaminophen  X1 for neck discomfort with good result.  Patient declined repositioning, will turn with minimal assistance PRN.  Caring rounds completed.     Concerns for physician to address:       Zone phone for oncoming shift:          Activity:  Level of Assistance: Maximum assist, patient does 25-49%    Cardiac:   Cardiac Monitoring:  no    Access:  Current line(s): Other None    Genitourinary:   Urinary Status: Voiding, External catheter    Respiratory:   O2 Device: Nasal cannula    GI:  Current diet: ADULT ORAL NUTRITION SUPPLEMENT; Breakfast, Lunch, Dinner; Educational Psychologist Protein Oral Supplement  ADULT DIET; Dysphagia - Soft and Bite Sized; Low Fat/Low Chol/High Fiber/NAS; No Pork    Pain Management:   Patient states pain is manageable on current regimen: YES    Skin:  Braden Scale Score: 14  Interventions: Wound Offloading (Prevention Methods): Bed, pressure reduction mattress, Elevate heels, Pillows, Repositioning, Turning  Pressure injury: no    Patient Safety:  Fall Score: Morse Total Score: 75  Fall Risk Interventions  Nursing Judgement-Fall Risk High(Add Comments): Yes  Toilet Every 2 Hours-In Advance of Need: Yes  Hourly Visual Checks: In bed, Eyes closed  Fall Visual Posted: Fall sign posted, Socks  Room Door Open: Yes  Patient Moved Closer to Nursing Station: No    Active Consults:  IP CONSULT TO SOCIAL WORK  IP CONSULT TO PULMONOLOGY    Length of Stay:  Expected LOS: 5  Actual LOS: 4      Karna Simpers, RN    "

## 2024-05-20 NOTE — Discharge Summary (Addendum)
 "                                  Discharge Summary    Name: Kimberly [name]  [id number]  Date of Birth: 1924/06/16 (Age: 89 y.o.)   Date of Admission: 05/15/2024  Date of Discharge: 05/20/2024  Attending Physician: No att. providers found    Discharge Diagnosis:     Consultations:  IP CONSULT TO SOCIAL WORK  IP CONSULT TO PULMONOLOGY  IP CONSULT TO CASE MANAGEMENT      Brief Admission History/Reason for Admission Per Roma Ladora GAILS, MD:   Shaunda Tipping is a 89 y.o.  female with PMHx significant for dementia, neuropathy, chronic hypoxic RF on prn O2 suppl, presents to the ER for evaluation of hypoxia.  Pt lives at home, has niece who cares for her 24hrs.  Per niece, pt graduated from hospice in July.  She uses prn O2 suppl (no formal lung pathology diagnosis, no hx of copd or asthma). Patient was walking around the house and niece had put pulse ox on the patient and saw her sats was 85% on RA, improved with O2 suppl.  EMS was called as niece wanted to make sur ethere was no lung pathology that cause the hypoxia.  By the time EMS arrived, O2 sats has improved.  Pt was taken to the ER for evaluation.    In the ER, SpO2 93% on 2LNC.  There was fever, chills, cp, sob, palpitations, n/v/d.  Vitals/imaging reviewed.    Brief Hospital Course by Main Problems:   Chronic hypoxic RF, uses prn O2 suppl 2 L  Atelectasis   Subpleural fibrotic changes left upper lobe   Patient currently admitted to medical unit with telemetry  CTA chest on 05/16/2024-bilateral lower lobe atelectasis subpleural fibrotic changes left upper lobe potentially treatment related.  No evidence of pulmonary embolism.  No respiratory symptoms currently.  She appears stable on 2LNC.  We were not able to capture any hypoxia here  [id number], she does not appear volume overloaded  RVP negative, procal 0.08  Nebs prn  O2 suppl- currently at baseline   Pulmonology was consulted-advised incentive spirometer.     PT OT recommended SNF, niece who is a caregiver wanted to  take patient with home health PT.  Patient left before I talked with caregiver Angeline.  I called and discussed with Regina> wants to go home health is being arranged.  She also requested palliative referral.  I gave number and address for Dr. Areta.  Continue home medication.  Patient has home oxygen    Neuropathy  RLS  HTN  Cont'  lyrica , losartan, requip  and cymbalta      Hyperkalemia  S/p Lokelma  x1  K wnl today      History of breast cancer status postlumpectomy left side      Discharge Exam:  Patient seen and examined by me on discharge day.  Pertinent Findings:  Patient Vitals for the past 24 hrs:   BP Temp Temp src Pulse Resp SpO2   05/20/24 0812 127/63 97.9 F (36.6 C) Oral 80 18 97 %   05/20/24 0152 -- -- -- -- 20 --   05/19/24 1940 (!) 152/74 98.2 F (36.8 C) Oral 82 18 96 %       Gen:    Not in distress  Chest: Clear lungs  CVS:   Regular rhythm.  No edema  Abd:  Soft,  not distended, not tender  Neuro: awake, moving all exts    Discharge/Recent Laboratory Results:  Recent Labs     05/19/24  0429   NA 139   K 4.5   CL 105   CO2 23   BUN 13   CREATININE 0.82   GLUCOSE 85   CALCIUM  9.2     Recent Labs     05/19/24  0429   HGB 11.4*   HCT 37.1   WBC 4.3   PLT 264       Discharge Medications:     Medication List        CONTINUE taking these medications      acetaminophen  500 MG tablet  Commonly known as: TYLENOL      albuterol  (2.5 MG/3ML) 0.083% nebulizer solution  Commonly known as: PROVENTIL   Take 3 mLs by nebulization 4 times daily as needed for Wheezing     ammonium lactate 12 % lotion  Commonly known as: LAC-HYDRIN     ascorbic acid  100 MG tablet  Commonly known as: VITAMIN C      cetirizine  5 MG tablet  Commonly known as: ZYRTEC   Take 1 tablet by mouth daily     chlorhexidine 0.12 % solution  Commonly known as: PERIDEX     docusate sodium  100 MG capsule  Commonly known as: Colace  Take 1 capsule by mouth daily     donepezil  10 MG tablet  Commonly known as: Aricept   Take 1 tablet by mouth daily (before  dinner)     * DULoxetine  60 MG extended release capsule  Commonly known as: CYMBALTA   Take 1 capsule by mouth daily     * DULoxetine  30 MG extended release capsule  Commonly known as: Cymbalta   Take 1 capsule by mouth daily (before dinner)     fish oil 1000 MG Cpdr     pregabalin  200 MG capsule  Commonly known as: LYRICA   TAKE 1 CAPSULE BY MOUTH TWICE DAILY. MAX DAILY AMOUNT: 400 MG     rOPINIRole  0.25 MG tablet  Commonly known as: REQUIP   Take 1 tablet by mouth nightly     valsartan  160 MG tablet  Commonly known as: DIOVAN   Take 1 tablet by mouth daily     vitamin D 50 MCG (2000 UT) Caps capsule  Commonly known as: CHOLECALCIFEROL     vitamin E  1000 units capsule     Zinc 30 MG Caps           * This list has 2 medication(s) that are the same as other medications prescribed for you. Read the directions carefully, and ask your doctor or other care provider to review them with you.                STOP taking these medications      Debrox 6.5 % otic solution  Generic drug: carbamide peroxide                  DISPOSITION:    Home with Family:       Home with HH/PT/OT/RN: x   SNF/LTC:    SAHR:    OTHER:            Code status:   Recommended diet: regular diet  Recommended activity: activity as tolerated  Wound care: None      Follow up with:   PCP : Kennie Darin MATSU, MD    Kennie Darin MATSU, MD  [address] [place] [address]  [phone]    Follow up      Kennie Darin MATSU, MD  [address] [place] [address]  [phone]          Areta Pigg, MD  [address]  [address]  [place] [address]  [phone]    Follow up            Total time in minutes spent coordinating this discharge (includes going over instructions, follow-up, prescriptions, and preparing report for sign off to her PCP) :  35 minutes  "

## 2024-05-20 NOTE — Care Coordination (Signed)
"     05/20/24 1611   Services At/After Discharge   [employer] Information Provided? No   Who will provide transportation at discharge? Family   Condition of Participation: Discharge Planning   The Patient and/or Patient Representative was provided with a Choice of Provider? Patient Representative  (pts niece)   The Patient and/Or Patient Representative agree with the Discharge Plan? Yes   Freedom of Choice list was provided with basic dialogue that supports the patient's individualized plan of care/goals, treatment preferences, and shares the quality data associated with the providers?  Yes     CM aware that pt will d/c on today and will d/c home with family support.  Pts niece would like to resume HH with [facility].  Pts niece will transport home on today.    Yolande Margo, MSW CM  [phone]    "

## 2024-05-21 NOTE — Care Coordination (Signed)
 "Ambulatory Care Coordination Note     05/21/2024 10:27 AM     Patient Current Location:  [place]      ACM contacted the caregiver by telephone. Verified name and DOB with caregiver as identifiers.         ACM: Tilton CHRISTELLA Bailey, RN     Challenges to be reviewed by the provider   Additional needs identified to be addressed with provider Yes  home health care- [facility]  PT-    OT-                 Method of communication with provider: chart routing.    Utilization: Has the patient been discharged from the hospital since your last call? yes -   Call within 2 business days of discharge: Yes, spoke with patient or caregiver.    Patient: Kimberly [name]    Patient DOB: [id number]   MRN: [id number]    Reason for Admission: Chronic hypoxic RF, uses prn O2 suppl 2 L  Atelectasis   Subpleural fibrotic changes left upper lobe   Discharge Date: 05/20/24  RURS: Readmission Risk Score: 15.4      Last Discharge Facility       Date Complaint Diagnosis Description Type Department Provider    05/15/24 Shortness of Breath Shortness of breath ... ED to Hosp-Admission (Discharged) (ADMITTED) [id number] Shade Cover, MD; Hemminger, Adam...            Was this an external facility discharge? No    Ambulatory Care Manager reviewed discharge instructions, medical action plan, and red flags with caregiver. The caregiver was given an opportunity to ask questions; all questions answered at this time.. The caregiver verbalized understanding.   Were discharge instructions available to patient? Yes.   Reviewed appropriate site of care based on symptoms and resources available to patient including: PCP  Specialist  After hours contact [phone]  Urgent care clinics  Home health  When to call 911  MyChart Messaging  The caregiver agrees to contact the primary care provider and/or specialist office for questions related to their healthcare.     Patients top risk factors for readmission: caregiver stress, functional cognitive ability,  functional physical ability, falls, and level of motivation    Hospital follow up appointment: Patient does not have a follow up appointment scheduled at time of call. ACM scheduled patient an appointment within 7 days of discharge.     Care Summary Note: Spoke to caregiver, Angeline, who says patient is still waking up around 4:30 AM this this morning. Has problem hearing. Is still on O2 and she is wandering if she needs it on all the time. It was on all the time in the hospital. Patient had no follow up appointment. Called the office and message sent to nurse to schedule. Scheduled for 05/25/2024. Medication review completed. All discharge orders discussed.  Caregiver is aware of PT/OT resuming and will contact today. ACM Introduction and Walk in Directory was sent on initial admission.    Offered patient enrollment in the Remote Patient Monitoring (RPM) program for in-home monitoring: Yes, but did not enroll at this time: limited patient ability to navigate RPM/equipment.     Assessments Completed:       05/21/2024    10:13 AM   Amb Fall Risk Assessment and TUG Test   Do you feel unsteady or are you worried about falling?  yes   2 or more falls in past year? yes   Fall with injury in past  year? yes    ,   Ambulatory Care Coordination Assessment    Care Coordination Protocol  Referral from Primary Care Provider: No  Week 1 - Initial Assessment     Do you have all of your prescriptions and are they filled?: Yes  Barriers to medication adherence: None  Are you able to afford your medications?: Yes  How often do you have trouble taking your medications the way you have been told to take them?: I always take them as prescribed.           Current Housing: Private Residence  Who do you live with?: Other Caregiver (Comment: 09-17-22: Patient's niece, Angeline Shaver, resides with the patient.)  Are you an active caregiver in your home?: No     Do you have any DME?: No  Patient Home Equipment: Oxygen, Nebulizer, Other     Per  the Fall Risk Screening, did the patient have 2 or more falls or 1 fall with injury in the past year?: Yes  How often do you think you are about to fall and you do NOT fall? For example, you grab something to stabilize yourself or hold onto a wall/furniture?: Rarely  Use of a Mobility Aid: No  Difficulty walking/impaired gait: No  Issues with feet or shoes like numbness, edema, shoes not fitting: No  Changes in vision, poor vision or poor lighting in environment: No  Dizziness: No  Other Fall Risk: Yes  What other fall risks does the patient have?: problem hearing     Frequent urination at night?: Yes  Do you use rails/bars?: No  Do you have a non-slip tub mat?: Yes     Are you experiencing loss of meaning?: No  Are you experiencing loss of hope and peace?: No     Thinking about your patient's physical health needs, are there any symptoms or problems (risk indicators) you are unsure about that require further investigation?: Moderate to severe symptoms or problems that impact on daily life   Are the patients physical health problems impacting on their mental well-being?: No identified areas of concern   Are there any problems with your patients lifestyle behaviors (alcohol, drugs, diet, exercise) that are impacting on physical or mental well-being?: Some mild concern of potential negative impact on well-being   Do you have any other concerns about your patients mental well-being? How would you rate their severity and impact on the patient?: Moderate to severe problems that interfere with function   How would you rate their home environment in terms of safety and stability (including domestic violence, insecure housing, neighbor harassment)?: Safety/stability questionable   How do daily activities impact on the patient's well-being? (include current or anticipated unemployment, work, caregiving, access to transportation or other): Some general dissatisfaction but no concern   How would you rate their social  network (family, work, friends)?: Adequate participation with social networks   How would you rate their financial resources (including ability to afford all required medical care)?: Financially secure, some resource challenges   How wells does the patient now understand their health and well-being (symptoms, signs or risk factors) and what they need to do to manage their health?: Little understanding which impacts on their ability to undertake better management   Do other services need to be involved to help this patient?: Other care/services in place and adequate   Are current services involved with this patient well-coordinated? (Include coordination with other services you are now recommendation): Required care/services in place and  adequately coordinated   Suggested Interventions and Community Resources  Fall Risk Prevention: In Process Disease Specific Clinic: In Process   Disease Assocation: In Process   Specialty Service Referral: In Process   Other Therapy Services: In Process   Other Services: In Process   Zone Management Tools: In Process         Other Interventions: Set up/Review Goals, Set up/Review an Education Plan           ,   Care Coordination Interventions    Referral from Primary Care Provider: No  Suggested Interventions and Community Resources  Fall Risk Prevention: In Process  Disease Specific Clinic: In Process  Disease Association: In Process  Specialty Services Referral: In Process  Other Therapy Services: In Process  Other Services: In Process  Zone Management Tools: In Process      ,   Hypertension - Encounter Level          ,   General Assessment    Do you have any symptoms that are causing concern?: Yes  Progression since Onset: Unchanged  Reported Symptoms: Shortness of Breath      ,   Social Drivers of Health     Tobacco Use: Low Risk  (03/30/2024)    Patient History     Smoking Tobacco Use: Never     Smokeless Tobacco Use: Never     Passive Exposure: Never   Alcohol Use: Not At Risk  (05/16/2024)    AUDIT-C     Frequency of Alcohol Consumption: Never     Average Number of Drinks: Patient does not drink     Frequency of Binge Drinking: Never   Financial Resource Strain: Low Risk  (01/19/2023)    Overall Financial Resource Strain (CARDIA)     Difficulty of Paying Living Expenses: Not very hard   Food Insecurity: No Food Insecurity (05/16/2024)    Hunger Vital Sign     Worried About Running Out of Food in the Last Year: Never true     Ran Out of Food in the Last Year: Never true   Transportation Needs: No Transportation Needs (05/16/2024)    PRAPARE - Therapist, Art (Medical): No     Lack of Transportation (Non-Medical): No   Physical Activity: Inactive (03/13/2024)    Exercise Vital Sign     Days of Exercise per Week: 0 days     Minutes of Exercise per Session: 0 min   Stress: Stress Concern Present (01/19/2023)    [employer] of Occupational Health - Occupational Stress Questionnaire     Feeling of Stress : To some extent   Social Connections: Moderately Isolated (01/19/2023)    Social Connection and Isolation Panel     Frequency of Communication with Friends and Family: Three times a week     Frequency of Social Gatherings with Friends and Family: Once a week     Attends Religious Services: 1 to 4 times per year     Active Member of [employer] or Organizations: No     Attends Banker Meetings: Never     Marital Status: Widowed   Intimate Partner Violence: Not At Risk (01/19/2023)    Humiliation, Afraid, Rape, and Kick questionnaire     Fear of Current or Ex-Partner: No     Emotionally Abused: No     Physically Abused: No     Sexually Abused: No   Depression: Not at risk (04/14/2024)    PHQ-2  PHQ-2 Score: 1   Housing Stability: Low Risk  (05/16/2024)    Housing Stability Vital Sign     Unable to Pay for Housing in the Last Year: No     Number of Times Moved in the Last Year: 0     Homeless in the Last Year: No   Interpersonal Safety: Not At Risk (05/16/2024)     Interpersonal Safety Domain Source: IP Abuse Screening     Physical abuse: Denies     Verbal abuse: Denies     Emotional abuse: Denies     Financial abuse: Denies     Sexual abuse: Denies   Utilities: Not At Risk (05/16/2024)    [facility] Utilities     Threatened with loss of utilities: No        Medications Reviewed:   Completed during this call    Advance Care Planning:   Reviewed and current     Care Planning:   Education Documentation  Educate transfer safety, taught by Arnaldo Tilton HERO, RN at 05/21/2024 10:20 AM.  Learner: Caregiver  Readiness: Acceptance  Method: Explanation  Response: Verbalizes Understanding    Provide High Risk Information for Falls Program, taught by Arnaldo Tilton HERO, RN at 05/21/2024 10:20 AM.  Learner: Caregiver  Readiness: Acceptance  Method: Explanation  Response: Verbalizes Understanding    Educate medication side effects, taught by Arnaldo Tilton HERO, RN at 05/21/2024 10:20 AM.  Learner: Caregiver  Readiness: Acceptance  Method: Explanation  Response: Verbalizes Understanding    Educate safety precautions, taught by Arnaldo Tilton HERO, RN at 05/21/2024 10:20 AM.  Learner: Caregiver  Readiness: Acceptance  Method: Explanation  Response: Verbalizes Understanding    Educate home safety, taught by Arnaldo Tilton HERO, RN at 05/21/2024 10:20 AM.  Learner: Caregiver  Readiness: Acceptance  Method: Explanation  Response: Verbalizes Understanding    COGNITIVE : FALLS-RISK OF, taught by Arnaldo Tilton HERO, RN at 05/21/2024 10:20 AM.  Learner: Caregiver  Readiness: Acceptance  Method: Explanation  Response: Verbalizes Understanding    COGNITIVE : FALLS-RISK OF, taught by Arnaldo Tilton HERO, RN at 05/21/2024 10:20 AM.  Learner: Caregiver  Readiness: Acceptance  Method: Explanation  Response: Verbalizes Understanding    Educate bathing safety, taught by Arnaldo Tilton HERO, RN at 05/21/2024 10:20 AM.  Learner: Caregiver  Readiness: Acceptance  Method: Explanation  Response: Verbalizes  Understanding    Educate ambulation safety, taught by Arnaldo Tilton HERO, RN at 05/21/2024 10:20 AM.  Learner: Caregiver  Readiness: Acceptance  Method: Explanation  Response: Verbalizes Understanding    Influenza Vaccine, taught by Arnaldo Tilton HERO, RN at 05/21/2024 10:20 AM.  Learner: Caregiver  Readiness: Acceptance  Method: Explanation  Response: Verbalizes Understanding    Pneumovax Recommendation, taught by Arnaldo Tilton HERO, RN at 05/21/2024 10:20 AM.  Learner: Caregiver  Readiness: Acceptance  Method: Explanation  Response: Verbalizes Understanding    Lifestyle Changes/Goal Setting, taught by Arnaldo Tilton HERO, RN at 05/21/2024 10:20 AM.  Learner: Caregiver  Readiness: Acceptance  Method: Explanation  Response: Verbalizes Understanding    General medication information, taught by Arnaldo Tilton HERO, RN at 05/21/2024 10:20 AM.  Learner: Caregiver  Readiness: Acceptance  Method: Explanation  Response: [facility], taught by Arnaldo Tilton HERO, RN at 05/21/2024 10:20 AM.  Learner: Caregiver  Readiness: Acceptance  Method: Explanation  Response: Verbalizes Understanding    Educate reporting changes in condition, taught by Arnaldo Tilton HERO, RN at 05/21/2024 10:20 AM.  Learner: Caregiver  Readiness: Acceptance  Method: Explanation  Response: Verbalizes Understanding    Educate Patient on When to Call for Symptoms, taught by Arnaldo Tilton HERO, RN at 05/21/2024 10:20 AM.  Learner: Caregiver  Readiness: Acceptance  Method: Explanation  Response: Verbalizes Understanding    Educate limitations or barriers to activity and/or exercise on discharge, taught by Arnaldo Tilton HERO, RN at 05/21/2024 10:20 AM.  Learner: Caregiver  Readiness: Acceptance  Method: Explanation  Response: Verbalizes Understanding    COGNITIVE : DISCHARGE PLANNING, taught by Arnaldo Tilton HERO, RN at 05/21/2024 10:20 AM.  Learner: Caregiver  Readiness: Acceptance  Method: Explanation  Response:  Verbalizes Understanding    Educate dietary adherence benefits, taught by Arnaldo Tilton HERO, RN at 05/21/2024 10:20 AM.  Learner: Caregiver  Readiness: Acceptance  Method: Explanation  Response: Verbalizes Understanding    Educate caregiver effective coping behavior, taught by Arnaldo Tilton HERO, RN at 05/21/2024 10:20 AM.  Learner: Caregiver  Readiness: Acceptance  Method: Explanation  Response: [employer], taught by Arnaldo Tilton HERO, RN at 05/21/2024 10:20 AM.  Learner: Caregiver  Readiness: Acceptance  Method: Explanation  Response: Verbalizes Understanding    Education Comments  No comments found.     ,    Goals Addressed                   This Visit's Progress     Conditions and Symptoms        I will schedule office visits, as directed by my provider.  I will keep my appointment or reschedule if I have to cancel.  I will notify my provider of any barriers to my plan of care.  I will follow my Zone Management tool to seek urgent or emergent care.  I will notify my provider of any symptoms that indicate a worsening of my condition.    Barriers: impairment:  physical:  and cognitive  Plan for overcoming my barriers: PT/OT, compliance  Confidence: 8/10  Anticipated Goal Completion Date: 05/16/24.    03/13/24  Completed home PT.  Uses a Rolator walker when needed.  Recently saw neuro (Dr. Claudene) for evaluation.  Completed PCP follow up on today    04/01/24  No new falls  Rolator  to assist with mobility  Will keep all scheduled appointments  Will notify provider if changes occur    04/14/24  Completed follow up as was scheduled    05/21/24  Hospital discharge on 05/20/24 for acute hypoxic respiratory failure  Home with PT/OT  Has all ordered medications  Called office to schedule appointment, will call back                     PCP/Specialist follow up:   Future Appointments         Provider Specialty Dept Phone    05/25/2024 3:20 PM Kennie Darin MATSU, MD Family Medicine  [phone]    03/31/2025 2:00 PM Claudene Debby LABOR, MD Neurology [phone]            Follow Up:   Plan for next ACM outreach in approximately 1 week to complete:  - CC Protocol assessments  - disease specific assessments  - goal progression  - education .   Caregiver is agreeable to this plan.        "

## 2024-05-21 NOTE — Telephone Encounter (Signed)
"  Patient needs a hospital follow up  for Acute Respiratory Failure, and needs referrals. Regina Lindsey(EC) can be reached at [phone].  "

## 2024-05-21 NOTE — Telephone Encounter (Signed)
"  Patient has appt. Scheduled for 05/25/2024 @ 3:20 pm  "

## 2024-05-25 ENCOUNTER — Ambulatory Visit: Admit: 2024-05-25 | Discharge: 2024-05-25 | Payer: MEDICARE | Attending: Family Medicine | Primary: Family Medicine

## 2024-05-25 LAB — EKG 12-LEAD
Atrial Rate: 86 {beats}/min
Diagnosis: NORMAL
P Axis: 39 degrees
P-R Interval: 136 ms
Q-T Interval: 394 ms
QRS Duration: 86 ms
QTc Calculation (Bazett): 471 ms
R Axis: -16 degrees
T Axis: 105 degrees
Ventricular Rate: 86 {beats}/min

## 2024-05-25 MED ORDER — OXYCODONE-ACETAMINOPHEN 5-325 MG PO TABS
5-325 | ORAL_TABLET | Freq: Three times a day (TID) | ORAL | 0 refills | Status: AC | PRN
Start: 2024-05-25 — End: 2024-06-08

## 2024-05-25 NOTE — Progress Notes (Signed)
"  Chief Complaint   Patient presents with    Follow-Up from [facility]     05/15/2024 - 05/20/2024 (5 days)  [facility]  Acute hypoxemic respiratory failure   Ittoop, Minu, MD        Have you been to the ER, urgent care clinic since your last visit?  Hospitalized since your last visit?      Yes  05/15/2024 - 05/20/2024 (5 days)  [facility]  Acute hypoxemic respiratory failure   Ittoop, Minu, MD       Have you seen or consulted any other health care providers outside of [facility] since your last visit?    NO             Vitals:    05/25/24 1533   BP: 120/70   Pulse: (!) 108   Resp: 14   Temp: 96.9 F (36.1 C)   TempSrc: Temporal   SpO2: 98%   Weight: 67 kg (147 lb 11.3 oz)   Height: 1.575 m (5' 2)      Health Maintenance Due   Topic Date Due    Shingles vaccine (1 of 2) Never done    Respiratory Syncytial Virus (RSV) Pregnant or age 36 yrs+ (1 - 1-dose 75+ series) Never done    Pneumococcal 50+ years Vaccine (2 of 2 - PCV) 08/19/2015    Annual Wellness Visit (Medicare)  01/16/2024    Flu vaccine (1) 03/06/2024    COVID-19 Vaccine (4 - 2025-26 season) 04/06/2024      The patient, Kimberly [name], identity was verified by name and DOB, pharmacy verified         "

## 2024-05-25 NOTE — Assessment & Plan Note (Signed)
"  Sending for PFT  "

## 2024-05-25 NOTE — Progress Notes (Signed)
 " Post-Discharge Transitional Care  Follow Up      Kimberly [name]   Date of Birth:  1924-04-01    Date of Office Visit:  05/25/2024  Date of Hospital Admission: 05/15/24  Date of Hospital Discharge: 05/20/24  Risk of hospital readmission (high >=14%. Medium >=10%) :Readmission Risk Score: 15.4      Care management risk score Rising risk (score 2-5) and Complex Care (Scores >=6): No Risk Score On File     Non face to face  following discharge, date last encounter closed (first attempt may have been earlier): 05/21/2024    Call initiated 2 business days of discharge: Yes, completed    ASSESSMENT/PLAN:   Hospital discharge follow-up  -     PR DISCHARGE MEDS RECONCILED W/ CURRENT OUTPATIENT MED LIST  Wheezing  Assessment & Plan:  Sending for PFT  Orders:  -     [facility] - Schenkein, Venetia, MD, Pulmonology, [place]  -     Pulmonary function test; Future  Cervical radiculopathy due to degenerative joint disease of spine  Assessment & Plan:  Will refer for Home health PT/OT  Orders:  -     [facility], [place]  -     oxyCODONE -acetaminophen  (PERCOCET) 5-325 MG per tablet; Take 1 tablet by mouth every 8 hours as needed for Pain for up to 14 days. Intended supply: 3 days. Take lowest dose possible to manage pain Max Daily Amount: 3 tablets, Disp-30 tablet, R-0Normal  Diabetic peripheral neuropathy associated with type 2 diabetes mellitus (HCC)  Lumbosacral radiculopathy due to degenerative joint disease of spine  Assessment & Plan:  Referring for home health PT/OT.  Sending short course of oxycodone  for breakthrough pain  Orders:  -     [facility], [employer]  -     oxyCODONE -acetaminophen  (PERCOCET) 5-325 MG per tablet; Take 1 tablet by mouth every 8 hours as needed for Pain for up to 14 days. Intended supply: 3 days. Take lowest dose possible to manage pain Max Daily Amount: 3 tablets, Disp-30 tablet, R-0Normal         Here with acregiver (Regina)    Subjective:   HPI:  Follow  up of Hospital problems/diagnosis(es): Acute on chronic hypoxic respiratory failure.    Inpatient course: Discharge summary reviewed- see chart.    Interval history/Current status: Caregiver states that patient continue to decline overall and weak even prior to going to the ER.  Family is requesting official diagnosis of COPD in order to reconsider hospice which patient graduated from.    Other chronic medical problems     Chronic DOE.     Per niece, patient does sleeps better with oxygen.   Hospital bed also helps to alleviate symptoms     Chronic back pain:  Uses a walker and also benefits from the hospital bed    Neuropathy/dementia:  On Lyrica  200mg  BID for neuropathy managed by neurology.  Completed home PT.  Uses a Rolator walker when needed.  Recently saw neuro (Dr. Claudene) for evaluation.     Restless leg - on Ropinirole .        Other Health Habits and social history:  Her only daughter passed away.     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    Patient Active Problem List   Diagnosis    SOB (shortness of breath) on exertion  Malignant neoplasm of upper-outer quadrant of left breast in female, estrogen receptor negative (HCC)    Primary insomnia    Memory loss    Acute bacterial conjunctivitis of right eye    Syncope and collapse    Primary hypertension    Disturbance of memory    High cholesterol    History of surgical removal of pituitary gland    Tension vascular headache    Headache    Bilateral carotid artery stenosis    Pituitary macroadenoma with extrasellar extension (HCC)    Headache disorder    Cerebral microvascular disease    Acute alteration in mental status    Chest pain with normal angiography    PVD (peripheral vascular disease)    Degenerative cervical spinal stenosis    Idiopathic small fiber peripheral neuropathy    Vitamin D deficiency    B12 deficiency    Immune-mediated neuropathy     Lumbosacral radiculopathy due to degenerative joint disease of spine    Cervical radiculopathy due to degenerative joint disease of spine    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 If you have any questions, ask your nurse or doctor.                START taking these medications      oxyCODONE -acetaminophen  5-325 MG per tablet  Commonly known as: Percocet  Take 1 tablet by mouth every 8 hours as needed for Pain for up to 14 days. Intended supply: 3 days. Take lowest dose possible to manage pain Max Daily Amount: 3 tablets  Started by: Dr. Darin Bumps, MD            CONTINUE taking these medications      acetaminophen  500 MG tablet  Commonly known as: TYLENOL      acetaminophen -codeine  300-30 MG per tablet  Commonly known as: TYLENOL  #3     albuterol  (2.5 MG/3ML) 0.083% nebulizer solution  Commonly known as: PROVENTIL   Take 3 mLs by nebulization 4 times daily as needed for Wheezing     ammonium lactate 12 % lotion  Commonly known as: LAC-HYDRIN     ascorbic acid  100 MG tablet  Commonly known as: VITAMIN C      cetirizine  5 MG tablet  Commonly known as: ZYRTEC   Take 1 tablet by mouth daily     chlorhexidine 0.12 % solution  Commonly known as: PERIDEX     docusate sodium  100 MG capsule  Commonly known as: Colace  Take 1 capsule by mouth daily     donepezil  10 MG tablet  Commonly known as:  Aricept   Take 1 tablet by mouth daily (before dinner)     * DULoxetine  60 MG extended  release capsule  Commonly known as: CYMBALTA   Take 1 capsule by mouth daily     * DULoxetine  30 MG extended release capsule  Commonly known as: Cymbalta   Take 1 capsule by mouth daily (before dinner)     fish oil 1000 MG Cpdr     fluticasone 50 MCG/ACT nasal spray  Commonly known as: FLONASE     pregabalin  200 MG capsule  Commonly known as: LYRICA   TAKE 1 CAPSULE BY MOUTH TWICE DAILY. MAX DAILY AMOUNT: 400 MG     rOPINIRole  0.25 MG tablet  Commonly known as: REQUIP   Take 1 tablet by mouth nightly     valsartan  160 MG tablet  Commonly known as: DIOVAN   Take 1 tablet by mouth daily     vitamin D 50 MCG (2000 UT) Caps capsule  Commonly known as: CHOLECALCIFEROL     vitamin E  1000 units capsule     Zinc 30 MG Caps           * This list has 2 medication(s) that are the same as other medications prescribed for you. Read the directions carefully, and ask your doctor or other care provider to review them with you.                   Where to Get Your Medications        These medications were sent to [facility] [address], [place] - [address] - P [phone] [place] [phone]  [address], [place] [address]      Phone: [phone]   oxyCODONE -acetaminophen  5-325 MG per tablet           Medications marked taking at this time  Outpatient Medications Marked as Taking for the 05/25/24 encounter (Office Visit) with Kennie Darin MATSU, MD   Medication Sig Dispense Refill    acetaminophen -codeine  (TYLENOL  #3) 300-30 MG per tablet Take 1 tablet by mouth every 6 hours as needed for Pain.      fluticasone (FLONASE) 50 MCG/ACT nasal spray 1 spray by Nasal route daily as needed for Rhinitis or Allergies      oxyCODONE -acetaminophen  (PERCOCET) 5-325 MG per tablet Take 1 tablet by mouth every 8 hours as needed for Pain for up to 14 days. Intended supply: 3 days. Take lowest dose possible to manage pain Max Daily  Amount: 3 tablets 30 tablet 0    pregabalin  (LYRICA ) 200 MG capsule TAKE 1 CAPSULE BY MOUTH TWICE DAILY. MAX DAILY AMOUNT: 400 MG 180 capsule 1    valsartan  (DIOVAN ) 160 MG tablet Take 1 tablet by mouth daily 90 tablet 3    donepezil  (ARICEPT ) 10 MG tablet Take 1 tablet by mouth daily (before dinner) 30 tablet 11    DULoxetine  (CYMBALTA ) 30 MG extended release capsule Take 1 capsule by mouth daily (before dinner) 30 capsule 11    docusate sodium  (COLACE) 100 MG capsule Take 1 capsule by mouth daily 30 capsule 5    DULoxetine  (CYMBALTA ) 60 MG extended release capsule Take 1 capsule by mouth daily 90 capsule 1    albuterol  (PROVENTIL ) (2.5 MG/3ML) 0.083% nebulizer solution Take 3 mLs by nebulization 4 times daily as needed for Wheezing 120 each 3    cetirizine  (ZYRTEC ) 5 MG tablet Take 1 tablet by mouth daily 90 tablet 2    rOPINIRole  (REQUIP ) 0.25 MG tablet Take 1 tablet by mouth nightly 90 tablet 1    Zinc 30 MG CAPS Take by mouth  Omega-3 Fatty Acids (FISH OIL) 1000 MG CPDR Take 3 capsules by mouth      chlorhexidine (PERIDEX) 0.12 % solution Swish and spit 15 mLs 2 times daily      vitamin E  1000 units capsule Take 1 capsule by mouth daily      Cholecalciferol (VITAMIN D3) 50 MCG (2000 UT) CAPS Take 1 capsule by mouth daily      ammonium lactate (LAC-HYDRIN) 12 % lotion Apply 1 Application topically as needed in the morning and 1 Application as needed in the evening.      ascorbic acid  (VITAMIN C ) 100 MG tablet Take 1 tablet by mouth daily      acetaminophen  (TYLENOL ) 500 MG tablet Take by mouth every 6 hours as needed for Pain          Medications patient taking as of now reconciled against medications ordered at time of hospital discharge: Yes    A comprehensive review of systems was negative except for what was noted in the HPI.    Objective:         Vitals:    05/25/24 1533   BP: 120/70   Pulse: (!) 108   Resp: 14   Temp: 96.9 F (36.1 C)   TempSrc: Temporal   SpO2: 98%   Weight: 67 kg (147 lb 11.3 oz)    Height: 1.575 m (5' 2)      Body mass index is 27.02 kg/m.     General: Alert and oriented, in no acute distress.  SKIN: No rash. No suspicious lesions or moles.  EYE: PERRL. Sclera and conjuctival clear. Extraocular movements intact.  EARS: External normal, canals clear, tympanic membranes normal.   NOSE: Mucosa healthy without drainage or ulceration.  OROPHARYNX: No suspicious lesions, normal dentition, pharynx, tongue and tonsils normal.  NECK: Supple; no masses; thyroid normal.   LUNGS: Respirations unlabored; clear to auscultation bilaterally.  CARDIOVASCULAR: Regular, rate, and rhythm without murmurs, gallops or rubs.  ABDOMEN: Soft; nontender; nondistended; normoactive bowel sounds; no masses or organomegaly.  MUSCULOSKELETAL: ROM limited by back pain  EXT: No edema.   Neurological: uses a walker      An electronic signature was used to authenticate this note.  --Darin Inetta Bumps, MD        "

## 2024-05-25 NOTE — Assessment & Plan Note (Signed)
"  Referring for home health PT/OT.  Sending short course of oxycodone  for breakthrough pain  "

## 2024-05-25 NOTE — Assessment & Plan Note (Signed)
"  Will refer for Home health PT/OT  "

## 2024-05-29 ENCOUNTER — Inpatient Hospital Stay: Admit: 2024-05-29 | Discharge: 2024-05-29 | Payer: MEDICARE | Attending: Family Medicine | Primary: Family Medicine

## 2024-05-29 DIAGNOSIS — R062 Wheezing: Principal | ICD-10-CM

## 2024-06-03 NOTE — Progress Notes (Signed)
"  Faxed to: [facility] order [phone]  "

## 2024-06-04 ENCOUNTER — Inpatient Hospital Stay
Admission: EM | Admit: 2024-06-04 | Discharge: 2024-06-07 | Disposition: A | Discharge status: Home Health | Payer: MEDICARE

## 2024-06-04 ENCOUNTER — Emergency Department: Admit: 2024-06-04 | Payer: MEDICARE | Primary: Family Medicine

## 2024-06-04 DIAGNOSIS — J8410 Pulmonary fibrosis, unspecified: Secondary | ICD-10-CM

## 2024-06-04 DIAGNOSIS — R0609 Other forms of dyspnea: Principal | ICD-10-CM

## 2024-06-04 LAB — COMPREHENSIVE METABOLIC PANEL
ALT: 16 U/L (ref 10–35)
AST: 23 U/L (ref 10–35)
Albumin/Globulin Ratio: 0.7 — ABNORMAL LOW (ref 1.1–2.2)
Albumin: 3 g/dL — ABNORMAL LOW (ref 3.5–5.2)
Alk Phosphatase: 98 U/L (ref 35–104)
Anion Gap: 10 mmol/L (ref 2–14)
BUN/Creatinine Ratio: 24 — ABNORMAL HIGH (ref 12–20)
BUN: 19 mg/dL (ref 8–23)
CO2: 26 mmol/L (ref 20–29)
Calcium: 9.6 mg/dL (ref 8.2–9.6)
Chloride: 105 mmol/L (ref 98–107)
Creatinine: 0.82 mg/dL (ref 0.60–1.00)
Est, Glom Filt Rate: 64 ml/min/1.73m2 (ref >59)
Globulin: 4.5 g/dL — ABNORMAL HIGH (ref 2.0–4.0)
Glucose: 99 mg/dL (ref 65–100)
Potassium: 4.6 mmol/L (ref 3.5–5.1)
Sodium: 141 mmol/L (ref 136–145)
Total Bilirubin: 0.2 mg/dL (ref 0.0–1.2)
Total Protein: 7.5 g/dL (ref 6.4–8.3)

## 2024-06-04 LAB — CBC WITH AUTO DIFFERENTIAL
Basophils %: 0.7 % (ref 0.0–1.0)
Basophils Absolute: 0.05 K/UL (ref 0.00–0.10)
Eosinophils %: 1.6 % (ref 0.0–7.0)
Eosinophils Absolute: 0.12 K/UL (ref 0.00–0.40)
Hematocrit: 36.7 % (ref 35.0–47.0)
Hemoglobin: 11.3 g/dL — ABNORMAL LOW (ref 11.5–16.0)
Immature Granulocytes %: 0.7 % — ABNORMAL HIGH (ref 0.0–0.5)
Immature Granulocytes Absolute: 0.05 K/UL — ABNORMAL HIGH (ref 0.00–0.04)
Lymphocytes %: 9.4 % — ABNORMAL LOW (ref 12.0–49.0)
Lymphocytes Absolute: 0.71 K/UL — ABNORMAL LOW (ref 0.80–3.50)
MCH: 22.3 pg — ABNORMAL LOW (ref 26.0–34.0)
MCHC: 30.8 g/dL (ref 30.0–36.5)
MCV: 72.5 FL — ABNORMAL LOW (ref 80.0–99.0)
MPV: 10.7 FL (ref 8.9–12.9)
Monocytes %: 7.4 % (ref 5.0–13.0)
Monocytes Absolute: 0.56 K/UL (ref 0.00–1.00)
Neutrophils %: 80.2 % — ABNORMAL HIGH (ref 32.0–75.0)
Neutrophils Absolute: 6.02 K/UL (ref 1.80–8.00)
Nucleated RBCs: 0 /100{WBCs}
Platelets: 294 K/uL (ref 150–400)
RBC: 5.06 M/uL (ref 3.80–5.20)
RDW: 16.3 % — ABNORMAL HIGH (ref 11.5–14.5)
WBC: 7.5 K/uL (ref 3.6–11.0)
nRBC: 0 K/uL (ref 0.00–0.01)

## 2024-06-04 LAB — EXTRA TUBES HOLD

## 2024-06-04 LAB — TROPONIN: Troponin T: 12.1 ng/L (ref 0–14)

## 2024-06-04 LAB — BRAIN NATRIURETIC PEPTIDE: NT Pro-BNP: 99 pg/mL (ref 0–450)

## 2024-06-04 MED ORDER — IOPAMIDOL 76 % IV SOLN
76 | Freq: Once | INTRAVENOUS | Status: AC | PRN
Start: 2024-06-04 — End: 2024-06-04
  Administered 2024-06-04: 65 mL via INTRAVENOUS

## 2024-06-04 MED FILL — ISOVUE-370 76 % IV SOLN: 76 % | INTRAVENOUS | Qty: 100 | Fill #0

## 2024-06-04 NOTE — ED Provider Notes (Signed)
 "      [facility]  EMERGENCY DEPARTMENT ENCOUNTER      Pt Name: Kimberly [name]  MRN: [id number]  Birthdate [birth year]  Date of evaluation: 06/04/2024  Provider: Okey Shock, MD    CHIEF COMPLAINT       Chief Complaint   Patient presents with    Wheezing         HISTORY OF PRESENT ILLNESS   (Location/Symptom, Timing/Onset, Context/Setting, Quality, Duration, Modifying Factors, Severity)  Note limiting factors.   89F w/ hx CVA, HTN, HLD, asthma?, dementia, PVD p/w 2d sob. Pt here w/ family who reports past 1-2d of increased SOB and wheezing w/ minimal exertion such as walking or moving in bed. Today was c/o right arm and leg pain but no swelling, weakness or numbness. No F/C, cough, N/V/D, rectal bleeding. Not c/o any chest pain. Was admitted to [facility]ks for similar where saw pulmonary. CT chest at the time showed pulmonary fibrosis w/o PE.            Review of External Medical Records:     Nursing Notes were reviewed.    REVIEW OF SYSTEMS    (2-9 systems for level 4, 10 or more for level 5)     Review of Systems    Except as noted above the remainder of the review of systems was reviewed and negative.       PAST MEDICAL HISTORY     Past Medical History:   Diagnosis Date    Acute CVA (cerebrovascular accident) (HCC) 09/12/2022    Adverse effect of anesthesia     slow to wake    Anginal pain     Arthritis     Breast cancer (HCC) 03/10/2018     left lumpectomy    Cancer (HCC) 01/2018    breast    Chronic pain     neuropathy    Constipation     GERD (gastroesophageal reflux disease)     Headache     Hearing loss     High cholesterol     Hypertension     Incontinence     Joint pain     Memory disorder     Muscle pain     Osteoporosis     Ringing in the ears     Snoring     Visual disturbance          SURGICAL HISTORY       Past Surgical History:   Procedure Laterality Date    BREAST LUMPECTOMY Left 03/10/2018    LEFT BREAST LUMPECTOMY WITH ULTRASOUND GUIDANCE performed by Lucy Ozell PARAS, MD at [facility]  AMBULATORY OR    HYSTERECTOMY (CERVIX STATUS UNKNOWN)      ORTHOPEDIC SURGERY      epidural pain mtg - pt unsure    OTHER SURGICAL HISTORY Right 10/15/2016    ligament correction right foot    OTHER SURGICAL HISTORY  11/26/2016    Pituitary tumor removal    US  BREAST BIOPSY W LOC DEVICE 1ST LESION LEFT Left 01/21/2018    US  BREAST NEEDLE BIOPSY LEFT 01/21/2018 [facility] RAD US          CURRENT MEDICATIONS       Previous Medications    ACETAMINOPHEN  (TYLENOL ) 500 MG TABLET    Take by mouth every 6 hours as needed for Pain    ACETAMINOPHEN -CODEINE  (TYLENOL  #3) 300-30 MG PER TABLET    Take 1 tablet by mouth every 6 hours as  needed for Pain.    ALBUTEROL  (PROVENTIL ) (2.5 MG/3ML) 0.083% NEBULIZER SOLUTION    Take 3 mLs by nebulization 4 times daily as needed for Wheezing    AMMONIUM LACTATE (LAC-HYDRIN) 12 % LOTION    Apply 1 Application topically as needed in the morning and 1 Application as needed in the evening.    ASCORBIC ACID  (VITAMIN C ) 100 MG TABLET    Take 1 tablet by mouth daily    CETIRIZINE  (ZYRTEC ) 5 MG TABLET    Take 1 tablet by mouth daily    CHLORHEXIDINE (PERIDEX) 0.12 % SOLUTION    Swish and spit 15 mLs 2 times daily    CHOLECALCIFEROL (VITAMIN D3) 50 MCG (2000 UT) CAPS    Take 1 capsule by mouth daily    DOCUSATE SODIUM  (COLACE) 100 MG CAPSULE    Take 1 capsule by mouth daily    DONEPEZIL  (ARICEPT ) 10 MG TABLET    Take 1 tablet by mouth daily (before dinner)    DULOXETINE  (CYMBALTA ) 30 MG EXTENDED RELEASE CAPSULE    Take 1 capsule by mouth daily (before dinner)    DULOXETINE  (CYMBALTA ) 60 MG EXTENDED RELEASE CAPSULE    Take 1 capsule by mouth daily    FLUTICASONE (FLONASE) 50 MCG/ACT NASAL SPRAY    1 spray by Nasal route daily as needed for Rhinitis or Allergies    OMEGA-3 FATTY ACIDS (FISH OIL) 1000 MG CPDR    Take 3 capsules by mouth    OXYCODONE -ACETAMINOPHEN  (PERCOCET) 5-325 MG PER TABLET    Take 1 tablet by mouth every 8 hours as needed for Pain for up to 14 days. Intended supply: 3 days. Take lowest dose  possible to manage pain Max Daily Amount: 3 tablets    PREGABALIN  (LYRICA ) 200 MG CAPSULE    TAKE 1 CAPSULE BY MOUTH TWICE DAILY. MAX DAILY AMOUNT: 400 MG    ROPINIROLE  (REQUIP ) 0.25 MG TABLET    Take 1 tablet by mouth nightly    VALSARTAN  (DIOVAN ) 160 MG TABLET    Take 1 tablet by mouth daily    VITAMIN E  1000 UNITS CAPSULE    Take 1 capsule by mouth daily    ZINC 30 MG CAPS    Take by mouth       ALLERGIES     Penicillins and Sulfa antibiotics    FAMILY HISTORY       Family History   Problem Relation Age of Onset    Dementia Sister     Dementia Brother     Cancer Daughter     Breast Cancer Daughter 40    Breast Cancer Sister 90    Breast Cancer Mother 61    No Known Problems Father           SOCIAL HISTORY       Social History     Socioeconomic History    Marital status: Widowed   Tobacco Use    Smoking status: Never     Passive exposure: Never    Smokeless tobacco: Never   Vaping Use    Vaping status: Never Used   Substance and Sexual Activity    Alcohol use: Not Currently    Drug use: No    Sexual activity: Not Currently     Partners: Male     Social Drivers of Health     Financial Resource Strain: Low Risk  (01/19/2023)    Overall Financial Resource Strain (CARDIA)     Difficulty of Paying Living Expenses:  Not very hard   Food Insecurity: No Food Insecurity (05/16/2024)    Hunger Vital Sign     Worried About Running Out of Food in the Last Year: Never true     Ran Out of Food in the Last Year: Never true   Transportation Needs: No Transportation Needs (05/16/2024)    PRAPARE - Therapist, Art (Medical): No     Lack of Transportation (Non-Medical): No   Physical Activity: Inactive (03/13/2024)    Exercise Vital Sign     Days of Exercise per Week: 0 days     Minutes of Exercise per Session: 0 min   Stress: Stress Concern Present (01/19/2023)    [employer] of Occupational Health - Occupational Stress Questionnaire     Feeling of Stress : To some extent   Social Connections: Moderately  Isolated (01/19/2023)    Social Connection and Isolation Panel     Frequency of Communication with Friends and Family: Three times a week     Frequency of Social Gatherings with Friends and Family: Once a week     Attends Religious Services: 1 to 4 times per year     Active Member of [employer] or Organizations: No     Attends Banker Meetings: Never     Marital Status: Widowed   Intimate Partner Violence: Not At Risk (01/19/2023)    Humiliation, Afraid, Rape, and Kick questionnaire     Fear of Current or Ex-Partner: No     Emotionally Abused: No     Physically Abused: No     Sexually Abused: No   Housing Stability: Low Risk  (05/16/2024)    Housing Stability Vital Sign     Unable to Pay for Housing in the Last Year: No     Number of Times Moved in the Last Year: 0     Homeless in the Last Year: No           PHYSICAL EXAM    (up to 7 for level 4, 8 or more for level 5)     ED Triage Vitals [06/04/24 1544]   BP Girls Systolic BP Percentile Girls Diastolic BP Percentile Boys Systolic BP Percentile Boys Diastolic BP Percentile Temp Temp Source Pulse   (!) 166/76 -- -- -- -- 97.8 F (36.6 C) Tympanic 83      Respirations SpO2 Height Weight - Scale       18 96 % 1.549 m (5' 1) 68.9 kg (151 lb 14.4 oz)           Body mass index is 22.95 kg/m.    Physical Exam  Constitutional:       General: She is not in acute distress.     Appearance: Normal appearance. She is not ill-appearing, toxic-appearing or diaphoretic.   HENT:      Head: Normocephalic.      Mouth/Throat:      Mouth: Mucous membranes are moist.      Pharynx: Oropharynx is clear. No oropharyngeal exudate or posterior oropharyngeal erythema.   Eyes:      General: No scleral icterus.     Extraocular Movements: Extraocular movements intact.      Conjunctiva/sclera: Conjunctivae normal.      Pupils: Pupils are equal, round, and reactive to light.   Cardiovascular:      Rate and Rhythm: Normal rate and regular rhythm.      Pulses: Normal pulses.      Heart  sounds: No murmur heard.     No friction rub.   Pulmonary:      Effort: Pulmonary effort is normal. No respiratory distress.      Breath sounds: Normal breath sounds. No stridor. No wheezing, rhonchi or rales.      Comments: Dyspnea when talking or moving around in bed  Abdominal:      General: Abdomen is flat. There is no distension.      Palpations: Abdomen is soft.      Tenderness: There is no abdominal tenderness. There is no guarding or rebound.   Musculoskeletal:         General: No swelling.      Cervical back: Normal range of motion. No rigidity.      Right lower leg: No edema.      Left lower leg: No edema.   Skin:     General: Skin is warm and dry.      Capillary Refill: Capillary refill takes less than 2 seconds.      Coloration: Skin is not jaundiced.   Neurological:      General: No focal deficit present.      Mental Status: She is alert.      Cranial Nerves: No cranial nerve deficit.      Sensory: No sensory deficit.      Motor: No weakness.      Coordination: Coordination normal.         DIAGNOSTIC RESULTS     EKG: All EKG's are interpreted by the Emergency Department Physician who either signs or Co-signs this chart in the absence of a cardiologist.    EKG Interpretation  SR, narrow QRS, nl intervals, no STE/STD/TWI    RADIOLOGY:   Interpretation per the Radiologist below, if available at the time of this note:    CTA CHEST W WO CONTRAST   Final Result   No CT evidence of acute pulmonary embolism within limits of examination.               Electronically signed by Elia Mcalpine      XR CHEST (2 VW)   Final Result   Grossly clear lungs         Electronically signed by Krystal Grippe, MD           LABS:  Admission on 06/04/2024   Component Date Value Ref Range Status    WBC 06/04/2024 7.5  3.6 - 11.0 K/uL Final    RBC 06/04/2024 5.06  3.80 - 5.20 M/uL Final    Hemoglobin 06/04/2024 11.3 (L)  11.5 - 16.0 g/dL Final    Hematocrit 89/69/7974 36.7  35.0 - 47.0 % Final    MCV 06/04/2024 72.5 (L)  80.0 - 99.0  FL Final    MCH 06/04/2024 22.3 (L)  26.0 - 34.0 PG Final    MCHC 06/04/2024 30.8  30.0 - 36.5 g/dL Final    RDW 89/69/7974 16.3 (H)  11.5 - 14.5 % Final    Platelets 06/04/2024 294  150 - 400 K/uL Final    MPV 06/04/2024 10.7  8.9 - 12.9 FL Final    Nucleated RBCs 06/04/2024 0.0  0 PER 100 WBC Final    nRBC 06/04/2024 0.00  0.00 - 0.01 K/uL Final    Neutrophils % 06/04/2024 80.2 (H)  32.0 - 75.0 % Final    Lymphocytes % 06/04/2024 9.4 (L)  12.0 - 49.0 % Final    Monocytes % 06/04/2024 7.4  5.0 - 13.0 %  Final    Eosinophils % 06/04/2024 1.6  0.0 - 7.0 % Final    Basophils % 06/04/2024 0.7  0.0 - 1.0 % Final    Immature Granulocytes % 06/04/2024 0.7 (H)  0.0 - 0.5 % Final    Neutrophils Absolute 06/04/2024 6.02  1.80 - 8.00 K/UL Final    Lymphocytes Absolute 06/04/2024 0.71 (L)  0.80 - 3.50 K/UL Final    Monocytes Absolute 06/04/2024 0.56  0.00 - 1.00 K/UL Final    Eosinophils Absolute 06/04/2024 0.12  0.00 - 0.40 K/UL Final    Basophils Absolute 06/04/2024 0.05  0.00 - 0.10 K/UL Final    Immature Granulocytes Absolute 06/04/2024 0.05 (H)  0.00 - 0.04 K/UL Final    Differential Type 06/04/2024 SMEAR SCANNED    Final    RBC Comment 06/04/2024     Final                    Value:ANISOCYTOSIS  1+      Sodium 06/04/2024 141  136 - 145 mmol/L Final    Potassium 06/04/2024 4.6  3.5 - 5.1 mmol/L Final    Chloride 06/04/2024 105  98 - 107 mmol/L Final    CO2 06/04/2024 26  20 - 29 mmol/L Final    Anion Gap 06/04/2024 10  2 - 14 mmol/L Final    Glucose 06/04/2024 99  65 - 100 mg/dL Final    Comment: <29 mg/dL Consistent with, but not fully diagnostic of hypoglycemia.  100 - 125 mg/dL Impaired fasting glucose/consistent with pre-diabetes mellitus.  > 126 mg/dl Fasting glucose consistent with overt diabetes mellitus      BUN 06/04/2024 19  8 - 23 MG/DL Final    Creatinine 89/69/7974 0.82  0.60 - 1.00 MG/DL Final    BUN/Creatinine Ratio 06/04/2024 24 (H)  12 - 20   Final    Est, Glom Filt Rate 06/04/2024 64  >59 ml/min/1.46m2  Final    Comment:    Pediatric calculator link: https://www.kidney.org/professionals/kdoqi/gfr_calculatorped     These results are not intended for use in patients <44 years of age.     eGFR results are calculated without a race factor using  the 2021 CKD-EPI equation. Careful clinical correlation is recommended, particularly when comparing to results calculated using previous equations.  The CKD-EPI equation is less accurate in patients with extremes of muscle mass, extra-renal metabolism of creatinine, excessive creatine ingestion, or following therapy that affects renal tubular secretion.      Calcium  06/04/2024 9.6  8.2 - 9.6 MG/DL Final    Total Bilirubin 06/04/2024 0.2  0.0 - 1.2 MG/DL Final    ALT 89/69/7974 16  10 - 35 U/L Final    AST 06/04/2024 23  10 - 35 U/L Final    Alk Phosphatase 06/04/2024 98  35 - 104 U/L Final    Total Protein 06/04/2024 7.5  6.4 - 8.3 g/dL Final    Albumin 89/69/7974 3.0 (L)  3.5 - 5.2 g/dL Final    Globulin 89/69/7974 4.5 (H)  2.0 - 4.0 g/dL Final    Albumin/Globulin Ratio 06/04/2024 0.7 (L)  1.1 - 2.2   Final    Troponin T 06/04/2024 12.1  0 - 14 ng/L Final    Patients taking more than 300mg /day of biotin may have a falsely negative result.    Ventricular Rate 06/04/2024 79  BPM Final    Atrial Rate 06/04/2024 79  BPM Final    P-R Interval 06/04/2024 146  ms Final  QRS Duration 06/04/2024 78  ms Final    Q-T Interval 06/04/2024 408  ms Final    QTc Calculation (Bazett) 06/04/2024 467  ms Final    P Axis 06/04/2024 66  degrees Final    R Axis 06/04/2024 116  degrees Final    T Axis 06/04/2024 70  degrees Final    Diagnosis 06/04/2024    Final                    Value:Normal sinus rhythm  Right axis deviation Nonspecific ST and T wave abnormality  Abnormal ECG  When compared with ECG of 15-May-2024 23:30,  QRS axis shifted right  T wave inversion no longer evident in Lateral leads  Confirmed by Benancio, M.D., Massimo ([id number]) on 06/05/2024 9:46:40 AM      NT Pro-BNP 06/04/2024 99   0 - 450 PG/ML Final    Comment:    NT-proBNP is highly sensitive for the detection of acute congestive heart failure in dyspneic patients. A NT-proBNP result <300 pg/mL has a negative predictive value of 98% for acute heart failure.  Marked elevations in NT-proBNP levels may be observed in patients with left ventricular congestive failure, atrial fibrillation without clear heart failure, acute coronary syndromes, right heart strain/failure (including pulmonary embolism and cor pulmonale), critical illness, renal failure or advanced age. Falsely low NT-proBNP may be observed in obese CHF patients.     Recent research  suggests the following cutoff values are appropriate based on patient age and clinical setting, reduce false positive (FP) results, and improve positive predictive values for acute heart failure:  Acutely dyspneic patient: age <50, use cutoff of 450 pg/mL  Acutely dyspneic patient: age 86-75, use cutoff of 900 pg/mL  Acutely dyspneic patient: age >49,                            use cutoff of 1800 pg/mL     FDA approved cutpoints for ruling-out heart failure in the office:  Ambulatory patient: age <29, use cutoff of 125 pg/mL  Ambulatory patient: age >66, use cutoff of 450 pg/mL      Specimen HOld 06/04/2024 1RED, 1BLU   Final    Comment: 06/04/2024 Add-on orders for these samples will be processed based on acceptable specimen integrity and analyte stability, which may vary by analyte.    Final    WBC 06/05/2024 5.9  3.6 - 11.0 K/uL Final    RBC 06/05/2024 4.89  3.80 - 5.20 M/uL Final    Hemoglobin 06/05/2024 11.2 (L)  11.5 - 16.0 g/dL Final    Hematocrit 89/68/7974 34.3 (L)  35.0 - 47.0 % Final    MCV 06/05/2024 70.1 (L)  80.0 - 99.0 FL Final    MCH 06/05/2024 22.9 (L)  26.0 - 34.0 PG Final    MCHC 06/05/2024 32.7  30.0 - 36.5 g/dL Final    RDW 89/68/7974 16.1 (H)  11.5 - 14.5 % Final    Platelets 06/05/2024 275  150 - 400 K/uL Final    MPV 06/05/2024 10.2  8.9 - 12.9 FL Final    Nucleated RBCs  06/05/2024 0.0  0 PER 100 WBC Final    nRBC 06/05/2024 0.00  0.00 - 0.01 K/uL Final    Neutrophils % 06/05/2024 92.0 (H)  32.0 - 75.0 % Final    Lymphocytes % 06/05/2024 5.9 (L)  12.0 - 49.0 % Final    Monocytes % 06/05/2024 1.2 (L)  5.0 - 13.0 % Final    Eosinophils % 06/05/2024 0.0  0.0 - 7.0 % Final    Basophils % 06/05/2024 0.2  0.0 - 1.0 % Final    Immature Granulocytes % 06/05/2024 0.7 (H)  0.0 - 0.5 % Final    Neutrophils Absolute 06/05/2024 5.43  1.80 - 8.00 K/UL Final    Lymphocytes Absolute 06/05/2024 0.35 (L)  0.80 - 3.50 K/UL Final    Monocytes Absolute 06/05/2024 0.07  0.00 - 1.00 K/UL Final    Eosinophils Absolute 06/05/2024 0.00  0.00 - 0.40 K/UL Final    Basophils Absolute 06/05/2024 0.01  0.00 - 0.10 K/UL Final    Immature Granulocytes Absolute 06/05/2024 0.04  0.00 - 0.04 K/UL Final    Differential Type 06/05/2024 SMEAR SCANNED    Final    RBC Comment 06/05/2024     Final                    Value:HYPOCHROMIA  2+      RBC Comment 06/05/2024     Final                    Value:MICROCYTOSIS  1+      RBC Comment 06/05/2024     Final                    Value:ANISOCYTOSIS  1+      RBC Comment 06/05/2024     Final                    Value:OVALOCYTES  PRESENT      Sodium 06/05/2024 144  136 - 145 mmol/L Final    Potassium 06/05/2024 4.6  3.5 - 5.1 mmol/L Final    Chloride 06/05/2024 109 (H)  98 - 107 mmol/L Final    CO2 06/05/2024 25  20 - 29 mmol/L Final    Anion Gap 06/05/2024 10  2 - 14 mmol/L Final    Glucose 06/05/2024 165 (H)  65 - 100 mg/dL Final    Comment: <29 mg/dL Consistent with, but not fully diagnostic of hypoglycemia.  100 - 125 mg/dL Impaired fasting glucose/consistent with pre-diabetes mellitus.  > 126 mg/dl Fasting glucose consistent with overt diabetes mellitus      BUN 06/05/2024 18  8 - 23 MG/DL Final    Creatinine 89/68/7974 0.72  0.60 - 1.00 MG/DL Final    BUN/Creatinine Ratio 06/05/2024 24 (H)  12 - 20   Final    Est, Glom Filt Rate 06/05/2024 75  >59 ml/min/1.28m2 Final    Comment:     Pediatric calculator link: https://www.kidney.org/professionals/kdoqi/gfr_calculatorped     These results are not intended for use in patients <28 years of age.     eGFR results are calculated without a race factor using  the 2021 CKD-EPI equation. Careful clinical correlation is recommended, particularly when comparing to results calculated using previous equations.  The CKD-EPI equation is less accurate in patients with extremes of muscle mass, extra-renal metabolism of creatinine, excessive creatine ingestion, or following therapy that affects renal tubular secretion.      Calcium  06/05/2024 9.2  8.2 - 9.6 MG/DL Final    Total Bilirubin 06/05/2024 <0.2  0.0 - 1.2 MG/DL Final    ALT 89/68/7974 17  10 - 35 U/L Final    AST 06/05/2024 24  10 - 35 U/L Final    Alk Phosphatase 06/05/2024 92  35 - 104 U/L Final  Total Protein 06/05/2024 7.1  6.4 - 8.3 g/dL Final    Albumin 89/68/7974 3.0 (L)  3.5 - 5.2 g/dL Final    Globulin 89/68/7974 4.1 (H)  2.0 - 4.0 g/dL Final    Albumin/Globulin Ratio 06/05/2024 0.7 (L)  1.1 - 2.2   Final    CRP 06/05/2024 3.7 (H)  0 - 0.5 mg/dL Final    Magnesium  06/05/2024 2.5 (H)  1.7 - 2.3 mg/dL Final    Phosphorus 89/68/7974 3.5  2.4 - 4.5 MG/DL Final    Troponin T 89/68/7974 11.0  0 - 14 ng/L Final    Patients taking more than 300mg /day of biotin may have a falsely negative result.    TSH, 3rd Generation 06/05/2024 0.634  0.270 - 4.200 uIU/mL Final    Vitamin B-12 06/05/2024 855  232 - 1245 pg/mL Final    Folate 06/05/2024 12.0  4.8 - 24.2 ng/mL Final    Specimen hemolysis has exceeded the interference as defined by Roche. Value may be falsely increased.    Specimen HOld 06/05/2024 1BLU   Final    Comment: 06/05/2024 Add-on orders for these samples will be processed based on acceptable specimen integrity and analyte stability, which may vary by analyte.    Final         EMERGENCY DEPARTMENT COURSE and DIFFERENTIAL DIAGNOSIS/MDM:   Vitals:    Vitals:    06/05/24 0545 06/05/24 0600  06/05/24 0615 06/05/24 0745   BP: (!) 153/64 (!) 133/56 128/63 (!) 186/89   Pulse: 79 74 75 90   Resp: 16 16 17 20    Temp:    97.7 F (36.5 C)   TempSrc:    Oral   SpO2: 97% 98% 98% 97%   Weight:    55.1 kg (121 lb 7.6 oz)   Height:               Medical Decision Making  76F w/ hx CVA, HTN, HLD, asthma, dementia, PVD p/w 2d increased SOB and wheezing w/ minimal exertion such as walking or moving in bed.     Pt nontoxic, afebrile, hemodynamically stable. Diminished BS. Sats drop significantly w/ any movement in bed. Ddx includes PNA vs URI/bronchitis/flu/COVID19 vs COPD/asthma exacerbation vs decompensated CHF vs PNX vs pulm edema/pleural effusion vs valvular heart disease vs pulm fibrosis/HTN vs interstitial lung disease vs malignancy/mets vs ACS vs cardiac dysrythmia vs HTN emergency/urgency vs symptomatic anemia vs electrolyte/metabolic, less likely PE based on Wells/Geneva score, unlikely pericardial effusion/tamponade based on presentation. Ordered CXR, CTA chest, EKG, labs. Monitor and reassess.    2100 Pt remains stable while resting in bed. EKG SR w/o ischemic changes. Labs [name] neg trop and BNP. Hgb stable 11s. CTA chest neg for PE or PNA. Saw pulm last admission at [facility] and thought to have asthma/COPD vs pulm fibrosis. Giving nebs and steroids. Tele admit.    Amount and/or Complexity of Data Reviewed  Independent Historian: caregiver  Radiology: ordered and independent interpretation performed. Decision-making details documented in ED Course.  ECG/medicine tests: ordered and independent interpretation performed. Decision-making details documented in ED Course.    Risk  Prescription drug management.  Decision regarding hospitalization.  Diagnosis or treatment significantly limited by social determinants of health.            ED Course            CONSULTS:  IP CONSULT TO PALLIATIVE CARE  IP CONSULT TO HOSPICE    PROCEDURES:  Unless otherwise noted below, none  Procedures  Total critical care time (not  including time spent performing separately reportable procedures): 39      FINAL IMPRESSION      1. Exertional dyspnea    2. Hypoxia          DISPOSITION/PLAN   DISPOSITION Admitted 06/04/2024 11:23:06 PM      PATIENT REFERRED TO:  No follow-up provider specified.    DISCHARGE MEDICATIONS:  New Prescriptions    No medications on file         Okey Shock, MD (electronically signed)  Emergency Attending Physician     Perfect Serve Consult for Admission  11:34 AM    ED Room Number: ER18/18  Patient Name and age:  Kimberly [name] 89 y.o.  female  [id number]  Working Diagnosis:   1. Exertional dyspnea    2. Hypoxia        Department: [facility] Adult ED - [phone]  Recommended Level of Care: telemetry  Readmission: Yes    Other:       Shock Okey, MD  06/05/24 1134    "

## 2024-06-04 NOTE — ED Notes (Signed)
"  Assumed care of pt. Pt resting quietly. C/o a pins and needles sensation in her right leg. Pt did have full sensation to touch. Pt currently on room air. Pt awaiting CT results and dispo.   "

## 2024-06-04 NOTE — H&P (Signed)
 "          History and Physical    Date of Service:  06/05/2024  Primary Care Provider: Kennie Darin MATSU, MD  Source of information: Chart review    Chief Complaint: Wheezing      History of Presenting Illness:   Kimberly [name] is a 89 y.o. female with past medical history significant for left breast cancer with resultant left lumpectomy, neuropathy, hypertension, dementia presented to the emergency room with shortness of breath.  Patient has chronic respiratory failure and on 2 L of oxygen at home.  There was no fever, rigors or chills reported.  Patient lives at home and cared for by her nieces.  Patient is not able to provide history because of underlying dementia and advanced age.  The patient shortness of breath is worse with mild exertion even when the patient is moving in bed and has increased work of breathing with activity  She was admitted to [facility] on 05/15/2024, patient was admitted to the hospital for 5 days with similar presentation, CTA of the chest done during the hospitalization showed treatment related fibrosis and no PE.  Patient was going to be discharged to SNF but her niece opted for home with home health and patient was discharged accordingly.  CTA of the chest done in the emergency room did not show acute pathology.  Patient received breathing treatment and Solu-Medrol  and was referred to the hospitalist service for admission.       REVIEW OF SYSTEMS:  Review of systems not obtained due to patient factors.     Past Medical History:   Diagnosis Date    Acute CVA (cerebrovascular accident) (HCC) 09/12/2022    Adverse effect of anesthesia     slow to wake    Anginal pain     Arthritis     Breast cancer (HCC) 03/10/2018     left lumpectomy    Cancer (HCC) 01/2018    breast    Chronic pain     neuropathy    Constipation     GERD (gastroesophageal reflux disease)     Headache     Hearing loss     High cholesterol     Hypertension     Incontinence     Joint pain     Memory  disorder     Muscle pain     Osteoporosis     Ringing in the ears     Snoring     Visual disturbance       Past Surgical History:   Procedure Laterality Date    BREAST LUMPECTOMY Left 03/10/2018    LEFT BREAST LUMPECTOMY WITH ULTRASOUND GUIDANCE performed by Lucy Ozell PARAS, MD at [facility]    HYSTERECTOMY (CERVIX STATUS UNKNOWN)      ORTHOPEDIC SURGERY      epidural pain mtg - pt unsure    OTHER SURGICAL HISTORY Right 10/15/2016    ligament correction right foot    OTHER SURGICAL HISTORY  11/26/2016    Pituitary tumor removal    US  BREAST BIOPSY W LOC DEVICE 1ST LESION LEFT Left 01/21/2018    US  BREAST NEEDLE BIOPSY LEFT 01/21/2018 [facility] US      Prior to Admission medications   Medication Sig Start Date End Date Taking? Authorizing Provider   acetaminophen -codeine  (TYLENOL  #3) 300-30 MG per tablet Take 1 tablet by mouth every 6 hours as needed for Pain. 05/08/24   [provider]  fluticasone (FLONASE) 50 MCG/ACT nasal spray 1 spray by Nasal route daily as needed for Rhinitis or Allergies 05/02/24   [provider]   oxyCODONE -acetaminophen  (PERCOCET) 5-325 MG per tablet Take 1 tablet by mouth every 8 hours as needed for Pain for up to 14 days. Intended supply: 3 days. Take lowest dose possible to manage pain Max Daily Amount: 3 tablets 05/25/24 06/08/24  Kennie Darin MATSU, MD   pregabalin  (LYRICA ) 200 MG capsule TAKE 1 CAPSULE BY MOUTH TWICE DAILY. MAX DAILY AMOUNT: 400 MG 04/20/24 10/17/24  Kennie Darin MATSU, MD   valsartan  (DIOVAN ) 160 MG tablet Take 1 tablet by mouth daily 03/30/24   Enowtaku, Andre G, MD   donepezil  (ARICEPT ) 10 MG tablet Take 1 tablet by mouth daily (before dinner) 03/30/24   Claudene Debby LABOR, MD   DULoxetine  (CYMBALTA ) 30 MG extended release capsule Take 1 capsule by mouth daily (before dinner) 03/30/24   Claudene Debby LABOR, MD   docusate sodium  (COLACE) 100 MG capsule Take 1 capsule by mouth daily 03/12/24   Kennie Darin MATSU, MD   DULoxetine  (CYMBALTA ) 60 MG extended  release capsule Take 1 capsule by mouth daily 03/08/24   Claudene Debby LABOR, MD   albuterol  (PROVENTIL ) (2.5 MG/3ML) 0.083% nebulizer solution Take 3 mLs by nebulization 4 times daily as needed for Wheezing 02/21/24   Kennie Darin MATSU, MD   cetirizine  (ZYRTEC ) 5 MG tablet Take 1 tablet by mouth daily 01/13/24   Kennie Darin MATSU, MD   rOPINIRole  (REQUIP ) 0.25 MG tablet Take 1 tablet by mouth nightly 09/17/23   Kennie Darin MATSU, MD   Zinc 30 MG CAPS Take by mouth    [provider]   Omega-3 Fatty Acids (FISH OIL) 1000 MG CPDR Take 3 capsules by mouth    [provider]   chlorhexidine (PERIDEX) 0.12 % solution Swish and spit 15 mLs 2 times daily    [provider]   vitamin E  1000 units capsule Take 1 capsule by mouth daily    [provider]   Cholecalciferol (VITAMIN D3) 50 MCG (2000 UT) CAPS Take 1 capsule by mouth daily    [provider]   ammonium lactate (LAC-HYDRIN) 12 % lotion Apply 1 Application topically as needed in the morning and 1 Application as needed in the evening. 03/29/22   [provider]   ascorbic acid  (VITAMIN C ) 100 MG tablet Take 1 tablet by mouth daily    [provider]   acetaminophen  (TYLENOL ) 500 MG tablet Take by mouth every 6 hours as needed for Pain    Automatic Reconciliation, Ar     Allergies   Allergen Reactions    Penicillins Hives and Myalgia    Sulfa Antibiotics Hives     Other reaction(s): Unknown (comments)      Family History   Problem Relation Age of Onset    Dementia Sister     Dementia Brother     Cancer Daughter     Breast Cancer Daughter 87    Breast Cancer Sister 56    Breast Cancer Mother 67    No Known Problems Father       Social History:  reports that she has never smoked. She has never been exposed to tobacco smoke. She has never used smokeless tobacco. She reports that she does not currently use alcohol. She reports that she does not use drugs.   Social Drivers of Health     Tobacco Use: Low Risk   (  05/25/2024)    Patient History     Smoking Tobacco Use: Never     Smokeless Tobacco Use: Never     Passive Exposure: Never   Alcohol Use: Not At Risk (05/16/2024)    AUDIT-C     Frequency of Alcohol Consumption: Never     Average Number of Drinks: Patient does not drink     Frequency of Binge Drinking: Never   Financial Resource Strain: Low Risk  (01/19/2023)    Overall Financial Resource Strain (CARDIA)     Difficulty of Paying Living Expenses: Not very hard   Food Insecurity: No Food Insecurity (05/16/2024)    Hunger Vital Sign     Worried About Running Out of Food in the Last Year: Never true     Ran Out of Food in the Last Year: Never true   Transportation Needs: No Transportation Needs (05/16/2024)    PRAPARE - Therapist, Art (Medical): No     Lack of Transportation (Non-Medical): No   Physical Activity: Inactive (03/13/2024)    Exercise Vital Sign     Days of Exercise per Week: 0 days     Minutes of Exercise per Session: 0 min   Stress: Stress Concern Present (01/19/2023)    [employer] of Occupational Health - Occupational Stress Questionnaire     Feeling of Stress : To some extent   Social Connections: Moderately Isolated (01/19/2023)    Social Connection and Isolation Panel     Frequency of Communication with Friends and Family: Three times a week     Frequency of Social Gatherings with Friends and Family: Once a week     Attends Religious Services: 1 to 4 times per year     Active Member of [employer] or Organizations: No     Attends Banker Meetings: Never     Marital Status: Widowed   Intimate Partner Violence: Not At Risk (01/19/2023)    Humiliation, Afraid, Rape, and Kick questionnaire     Fear of Current or Ex-Partner: No     Emotionally Abused: No     Physically Abused: No     Sexually Abused: No   Depression: Not at risk (04/14/2024)    PHQ-2     PHQ-2 Score: 1   Housing Stability: Low Risk  (05/16/2024)    Housing Stability Vital Sign     Unable to Pay for Housing  in the Last Year: No     Number of Times Moved in the Last Year: 0     Homeless in the Last Year: No   Interpersonal Safety: Not At Risk (05/16/2024)    Interpersonal Safety Domain Source: IP Abuse Screening     Physical abuse: Denies     Verbal abuse: Denies     Emotional abuse: Denies     Financial abuse: Denies     Sexual abuse: Denies   Utilities: Not At Risk (05/16/2024)    [facility] Utilities     Threatened with loss of utilities: No        Medications were reconciled to the best of my ability given all available resources at the time of admission. Route is PO if not otherwise noted.     Family and social history were personally reviewed, all pertinent and relevant details are outlined as above.    Objective:   BP (!) 139/59   Pulse 79   Temp 97.8 F (36.6 C) (Tympanic)   Resp 14  Ht 1.549 m (5' 1)   Wt 68.9 kg (151 lb 14.4 oz)   SpO2 94%   BMI 28.70 kg/m         PHYSICAL EXAM:   General appearance: Patient appears ill, in moderate distress.  Head: Normal cephalic, atraumatic.  Eyes: Normal eye movement, no redness no drainage.  Ears: Normal external ear and no drainage.  Nose: No deformity and no drainage.  Mouth and throat: No visible oral lesion.  Neck: Supple, no JVD no thyromegaly.  Chest: Few expiratory wheezing, no crackles  Heart: Normal S1 and S2, regular, no clinically appreciable murmur.  Abdomen: Soft nontender normal bowel sounds.  CNS: Alert, oriented x 1, no gross or focal neurological deficit.  Extremities: No edema, pulses 2+ bilaterally.  Musculoskeletal system: No joint deformity and swelling.  Skin: No active skin lesion seen in the exposed part of the body.  Psychiatry: Normal mood and affect.  Lymphatic system: No cervical lymphadenopathy.  Data Review:   I have independently reviewed and interpreted patient's lab and all other diagnostic data    Abnormal Labs Reviewed   CBC WITH AUTO DIFFERENTIAL - Abnormal; Notable for the following components:       Result Value    Hemoglobin 11.3  (*)     MCV 72.5 (*)     MCH 22.3 (*)     RDW 16.3 (*)     Neutrophils % 80.2 (*)     Lymphocytes % 9.4 (*)     Immature Granulocytes % 0.7 (*)     Lymphocytes Absolute 0.71 (*)     Immature Granulocytes Absolute 0.05 (*)     All other components within normal limits   COMPREHENSIVE METABOLIC PANEL - Abnormal; Notable for the following components:    BUN/Creatinine Ratio 24 (*)     Albumin 3.0 (*)     Globulin 4.5 (*)     Albumin/Globulin Ratio 0.7 (*)     All other components within normal limits       @MICRORESULTS @    IMAGING:   CTA CHEST W WO CONTRAST   Final Result   No CT evidence of acute pulmonary embolism within limits of examination.               Electronically signed by Elia Mcalpine      XR CHEST (2 VW)   Final Result   Grossly clear lungs         Electronically signed by Krystal Grippe, MD           ECG/ECHO:  @LASTCARDIOLOGY @       Notes reviewed from all clinical/nonclinical/nursing services involved in patient's clinical care. Care coordination discussions were held with appropriate clinical/nonclinical/ nursing providers based on care coordination needs.     Assessment:   Given the patient's current clinical presentation, there is a high level of concern for decompensation if discharged from the emergency department. Complex decision making was performed, which includes reviewing the patient's available past medical records, laboratory results, and imaging studies.    Principal Problem:    Pulmonary fibrosis (HCC)  Resolved Problems:    * No resolved hospital problems. *      A/P   1.  Acute on chronic respiratory failure with hypoxia POA: Will admit the patient for further evaluation and treatment.  Will identify and treat underlying etiological factors including suspected treatment related fibrosis of the lung.  BNP level is within normal limit.  Will check serial cardiac markers to rule  out acute myocardial infarction.  CTA of the chest negative for PE and pneumonia.  Will continue supplemental  oxygen.  Also continue DuoNeb  as needed.  Hospice/palliative care will be requested to address treatment goals.    2.  Suspected treatment related pulmonary fibrosis POA: This most likely contributing to the patient respiratory failure.  Supportive therapy.  Hospice/palliative care consult to be requested to address treatment goals.    3.  Dementia POA: Will continue Aricept  and supportive therapy.  Will check B12 and folate levels.    4.  Neuropathy POA: Will resume preadmission medications    5.  Essential hypertension POA: Will resume preadmission medication and monitor blood pressure closely    6.  Restless leg syndrome POA: Will continue Requip         DIET: ADULT DIET; Regular; Low Fat/Low Chol/High Fiber/NAS   ISOLATION PRECAUTIONS: No active isolations  CODE STATUS: Full Code   Central Line:   None  DVT PROPHYLAXIS: Lovenox   FUNCTIONAL STATUS PRIOR TO HOSPITALIZATION: Ambulatory and capable of self-care but relies on assistive devices (rolling walker/cane).   Ambulatory status/function: Ambulates with assistance:  Walker   EARLY MOBILITY ASSESSMENT: Recommend an assessment from physical therapy and/or occupational therapy  ANTICIPATED DISCHARGE: Greater than 48 hours.  ANTICIPATED DISPOSITION: Home with Hospice  EMERGENCY CONTACT/SURROGATE DECISION MAKER: [name], niece    CRITICAL CARE WAS PERFORMED FOR THIS ENCOUNTER: YES. I had a face to face encounter with the patient, reviewed and interpreted patient data including clinical events, labs, images, vital signs, I/O's, and examined patient.  I have discussed the case and the plan and management of the patient's care with the consulting services, the bedside nurses and necessary ancillary providers.  This patient has a high probability of imminent, clinically significant deterioration, which requires the highest level of preparedness to intervene urgently. I participated in the decision-making and personally managed or directed the management of the  following life and organ supporting interventions that required my frequent assessment to treat or prevent imminent deterioration.  I personally spent 45 minutes of critical care time.  This is time spent at this critically ill patient's bedside actively involved in patient care as well as the coordination of care and discussions with the patient's family.  This does not include any procedural time which has been billed separately.      Signed By: Thersia Fairly, MD     June 05, 2024         Please note that this dictation may have been completed with Dragon, the computer voice recognition software.  Quite often unanticipated grammatical, syntax, homophones, and other interpretive errors are inadvertently transcribed by the computer software.  Please disregard these errors.  Please excuse any errors that have escaped final proofreading.    "

## 2024-06-04 NOTE — ED Triage Notes (Signed)
"  Pt arrives from home    Cc wheezing, arm hurting    Denies chest pain,   Had been admitted for wheezing 2 weeks ago. Wheezing     On O2 at home 2 L           "

## 2024-06-04 NOTE — ED Notes (Signed)
 Report given to [facility], [facility].

## 2024-06-04 NOTE — ED Notes (Signed)
"  Kimberly [name] is a 89 y.o. female with a  PMHx of DM, dementia who presents to the emergency department for wheezing and exertional dyspnea. Was admitted at [facility] for respiratory distress and similar symptoms. Denies CP, lower leg edema, or productive cough. On 2L O2 at night.       3:45 PM  I have evaluated the patient as the Provider in Rapid Medical Evaluation (RME). I have reviewed her vital signs and the triage nurse assessment. I have talked with the patient and any available family and advised that I am the provider in triage and have ordered the appropriate study to initiate their work up based on the clinical presentation during my assessment. I have advised that the patient will be accommodated in the Main ED as soon as possible. I have also requested to contact the triage nurse or myself immediately if the patient experiences any changes in their condition during this brief waiting period.  Thersia DELENA Sharps, PA-C          Sharps Thersia DELENA, [place]  06/04/24 1545    "

## 2024-06-05 LAB — CBC WITH AUTO DIFFERENTIAL
Basophils %: 0.2 % (ref 0.0–1.0)
Basophils Absolute: 0.01 K/UL (ref 0.00–0.10)
Eosinophils %: 0 % (ref 0.0–7.0)
Eosinophils Absolute: 0 K/UL (ref 0.00–0.40)
Hematocrit: 34.3 % — ABNORMAL LOW (ref 35.0–47.0)
Hemoglobin: 11.2 g/dL — ABNORMAL LOW (ref 11.5–16.0)
Immature Granulocytes %: 0.7 % — ABNORMAL HIGH (ref 0.0–0.5)
Immature Granulocytes Absolute: 0.04 K/UL (ref 0.00–0.04)
Lymphocytes %: 5.9 % — ABNORMAL LOW (ref 12.0–49.0)
Lymphocytes Absolute: 0.35 K/UL — ABNORMAL LOW (ref 0.80–3.50)
MCH: 22.9 pg — ABNORMAL LOW (ref 26.0–34.0)
MCHC: 32.7 g/dL (ref 30.0–36.5)
MCV: 70.1 FL — ABNORMAL LOW (ref 80.0–99.0)
MPV: 10.2 FL (ref 8.9–12.9)
Monocytes %: 1.2 % — ABNORMAL LOW (ref 5.0–13.0)
Monocytes Absolute: 0.07 K/UL (ref 0.00–1.00)
Neutrophils %: 92 % — ABNORMAL HIGH (ref 32.0–75.0)
Neutrophils Absolute: 5.43 K/UL (ref 1.80–8.00)
Nucleated RBCs: 0 /100{WBCs}
Platelets: 275 K/uL (ref 150–400)
RBC: 4.89 M/uL (ref 3.80–5.20)
RDW: 16.1 % — ABNORMAL HIGH (ref 11.5–14.5)
WBC: 5.9 K/uL (ref 3.6–11.0)
nRBC: 0 K/uL (ref 0.00–0.01)

## 2024-06-05 LAB — COMPREHENSIVE METABOLIC PANEL
ALT: 17 U/L (ref 10–35)
AST: 24 U/L (ref 10–35)
Albumin/Globulin Ratio: 0.7 — ABNORMAL LOW (ref 1.1–2.2)
Albumin: 3 g/dL — ABNORMAL LOW (ref 3.5–5.2)
Alk Phosphatase: 92 U/L (ref 35–104)
Anion Gap: 10 mmol/L (ref 2–14)
BUN/Creatinine Ratio: 24 — ABNORMAL HIGH (ref 12–20)
BUN: 18 mg/dL (ref 8–23)
CO2: 25 mmol/L (ref 20–29)
Calcium: 9.2 mg/dL (ref 8.2–9.6)
Chloride: 109 mmol/L — ABNORMAL HIGH (ref 98–107)
Creatinine: 0.72 mg/dL (ref 0.60–1.00)
Est, Glom Filt Rate: 75 ml/min/1.73m2 (ref >59)
Globulin: 4.1 g/dL — ABNORMAL HIGH (ref 2.0–4.0)
Glucose: 165 mg/dL — ABNORMAL HIGH (ref 65–100)
Potassium: 4.6 mmol/L (ref 3.5–5.1)
Sodium: 144 mmol/L (ref 136–145)
Total Bilirubin: <0.2 mg/dL (ref 0.0–1.2)
Total Protein: 7.1 g/dL (ref 6.4–8.3)

## 2024-06-05 LAB — EKG 12-LEAD
Atrial Rate: 79 {beats}/min
Diagnosis: NORMAL
P Axis: 66 degrees
P-R Interval: 146 ms
Q-T Interval: 408 ms
QRS Duration: 78 ms
QTc Calculation (Bazett): 467 ms
R Axis: 116 degrees
T Axis: 70 degrees
Ventricular Rate: 79 {beats}/min

## 2024-06-05 LAB — EXTRA TUBES HOLD

## 2024-06-05 LAB — VITAMIN B12 & FOLATE
Folate: 12 ng/mL (ref 4.8–24.2)
Vitamin B-12: 855 pg/mL (ref 232–1245)

## 2024-06-05 LAB — PHOSPHORUS: Phosphorus: 3.5 mg/dL (ref 2.4–4.5)

## 2024-06-05 LAB — POCT GLUCOSE: POC Glucose: 115 mg/dL (ref 65–117)

## 2024-06-05 LAB — TROPONIN: Troponin T: 11 ng/L (ref 0–14)

## 2024-06-05 LAB — C-REACTIVE PROTEIN: CRP: 3.7 mg/dL — ABNORMAL HIGH (ref 0–0.5)

## 2024-06-05 LAB — MAGNESIUM: Magnesium: 2.5 mg/dL — ABNORMAL HIGH (ref 1.7–2.3)

## 2024-06-05 LAB — TSH: TSH, 3rd Generation: 0.634 u[IU]/mL (ref 0.270–4.200)

## 2024-06-05 MED ORDER — ACETAMINOPHEN 650 MG RE SUPP
650 | Freq: Four times a day (QID) | RECTAL | Status: DC | PRN
Start: 2024-06-05 — End: 2024-06-07

## 2024-06-05 MED ORDER — VALSARTAN 160 MG PO TABS
160 | Freq: Every day | ORAL | Status: DC
Start: 2024-06-05 — End: 2024-06-07
  Administered 2024-06-05 – 2024-06-07 (×3): 160 mg via ORAL

## 2024-06-05 MED ORDER — POTASSIUM CHLORIDE ER 10 MEQ PO TBCR
10 | ORAL | Status: DC | PRN
Start: 2024-06-05 — End: 2024-06-07

## 2024-06-05 MED ORDER — STERILE WATER FOR INJECTION (MIXTURES ONLY)
125 | Freq: Once | INTRAMUSCULAR | Status: AC
Start: 2024-06-05 — End: 2024-06-04
  Administered 2024-06-05: 02:00:00 70 mg via INTRAVENOUS

## 2024-06-05 MED ORDER — DONEPEZIL HCL 10 MG PO TABS
10 | Freq: Every day | ORAL | Status: DC
Start: 2024-06-05 — End: 2024-06-07
  Administered 2024-06-05 – 2024-06-06 (×2): 10 mg via ORAL

## 2024-06-05 MED ORDER — MORPHINE SULFATE (PF) 2 MG/ML IV SOLN
2 | INTRAVENOUS | Status: DC | PRN
Start: 2024-06-05 — End: 2024-06-07
  Administered 2024-06-07: 17:00:00 1 mg via INTRAVENOUS

## 2024-06-05 MED ORDER — POTASSIUM BICARB-CITRIC ACID 20 MEQ PO TBEF
20 | ORAL | Status: DC | PRN
Start: 2024-06-05 — End: 2024-06-07

## 2024-06-05 MED ORDER — VITAMIN E 450 MG (1000 UT) PO CAPS
450 | Freq: Every day | ORAL | Status: DC
Start: 2024-06-05 — End: 2024-06-07
  Administered 2024-06-05 – 2024-06-07 (×3): 1000 [IU] via ORAL

## 2024-06-05 MED ORDER — IPRATROPIUM-ALBUTEROL 0.5-2.5 (3) MG/3ML IN SOLN
0.5-2.5 | RESPIRATORY_TRACT | Status: AC
Start: 2024-06-05 — End: 2024-06-04
  Administered 2024-06-05: 02:00:00 1 via RESPIRATORY_TRACT

## 2024-06-05 MED ORDER — NORMAL SALINE FLUSH 0.9 % IV SOLN
0.9 | INTRAVENOUS | Status: DC | PRN
Start: 2024-06-05 — End: 2024-06-07

## 2024-06-05 MED ORDER — ACETAMINOPHEN 325 MG PO TABS
325 | Freq: Four times a day (QID) | ORAL | Status: DC | PRN
Start: 2024-06-05 — End: 2024-06-07
  Administered 2024-06-05 – 2024-06-07 (×3): 650 mg via ORAL

## 2024-06-05 MED ORDER — NORMAL SALINE FLUSH 0.9 % IV SOLN
0.9 | Freq: Two times a day (BID) | INTRAVENOUS | Status: DC
Start: 2024-06-05 — End: 2024-06-07
  Administered 2024-06-05 – 2024-06-06 (×2): 10 mL via INTRAVENOUS
  Administered 2024-06-07: 15:00:00 5 mL via INTRAVENOUS
  Administered 2024-06-07: 10 mL via INTRAVENOUS

## 2024-06-05 MED ORDER — ENOXAPARIN SODIUM 40 MG/0.4ML IJ SOSY
40 | Freq: Every day | INTRAMUSCULAR | Status: DC
Start: 2024-06-05 — End: 2024-06-07
  Administered 2024-06-05 – 2024-06-07 (×3): 40 mg via SUBCUTANEOUS

## 2024-06-05 MED ORDER — OXYCODONE HCL 5 MG PO TABS
5 | Freq: Four times a day (QID) | ORAL | Status: DC | PRN
Start: 2024-06-05 — End: 2024-06-07
  Administered 2024-06-07: 15:00:00 2.5 mg via ORAL

## 2024-06-05 MED ORDER — PREGABALIN 100 MG PO CAPS
100 | Freq: Two times a day (BID) | ORAL | Status: DC
Start: 2024-06-05 — End: 2024-06-07
  Administered 2024-06-05 – 2024-06-07 (×5): 200 mg via ORAL

## 2024-06-05 MED ORDER — ASCORBIC ACID 500 MG PO TABS
500 | Freq: Every day | ORAL | Status: DC
Start: 2024-06-05 — End: 2024-06-07
  Administered 2024-06-05 – 2024-06-07 (×3): 500 mg via ORAL

## 2024-06-05 MED ORDER — DULOXETINE HCL 30 MG PO CPEP
30 | Freq: Every day | ORAL | Status: DC
Start: 2024-06-05 — End: 2024-06-07
  Administered 2024-06-05 – 2024-06-06 (×2): 30 mg via ORAL

## 2024-06-05 MED ORDER — POTASSIUM CHLORIDE 10 MEQ/100ML IV SOLN
10 | INTRAVENOUS | Status: DC | PRN
Start: 2024-06-05 — End: 2024-06-07

## 2024-06-05 MED ORDER — POLYETHYLENE GLYCOL 3350 17 G PO PACK
17 | Freq: Every day | ORAL | Status: DC | PRN
Start: 2024-06-05 — End: 2024-06-07

## 2024-06-05 MED ORDER — SODIUM CHLORIDE 0.9 % IV SOLN
0.9 | INTRAVENOUS | Status: DC | PRN
Start: 2024-06-05 — End: 2024-06-07

## 2024-06-05 MED ORDER — IPRATROPIUM-ALBUTEROL 0.5-2.5 (3) MG/3ML IN SOLN
0.5-2.5 | Freq: Four times a day (QID) | RESPIRATORY_TRACT | Status: DC
Start: 2024-06-05 — End: 2024-06-07
  Administered 2024-06-05 – 2024-06-07 (×6): 1 via RESPIRATORY_TRACT

## 2024-06-05 MED ORDER — STERILE WATER FOR INJECTION (MIXTURES ONLY)
125 | Freq: Once | INTRAMUSCULAR | Status: DC
Start: 2024-06-05 — End: 2024-06-04

## 2024-06-05 MED ORDER — IPRATROPIUM-ALBUTEROL 0.5-2.5 (3) MG/3ML IN SOLN
0.5-2.5 | RESPIRATORY_TRACT | Status: DC | PRN
Start: 2024-06-05 — End: 2024-06-07
  Administered 2024-06-05: 18:00:00 1 via RESPIRATORY_TRACT

## 2024-06-05 MED ORDER — ONDANSETRON 4 MG PO TBDP
4 | Freq: Three times a day (TID) | ORAL | Status: DC | PRN
Start: 2024-06-05 — End: 2024-06-07

## 2024-06-05 MED ORDER — MAGNESIUM SULFATE 2000 MG/50 ML IVPB PREMIX
2 | INTRAVENOUS | Status: DC | PRN
Start: 2024-06-05 — End: 2024-06-07

## 2024-06-05 MED ORDER — ROPINIROLE HCL 0.25 MG PO TABS
0.25 | Freq: Every evening | ORAL | Status: DC
Start: 2024-06-05 — End: 2024-06-07
  Administered 2024-06-05 – 2024-06-07 (×2): 0.25 mg via ORAL

## 2024-06-05 MED ORDER — ONDANSETRON HCL 4 MG/2ML IJ SOLN
4 | Freq: Four times a day (QID) | INTRAMUSCULAR | Status: DC | PRN
Start: 2024-06-05 — End: 2024-06-07

## 2024-06-05 MED FILL — VITAMIN C 500 MG PO TABS: 500 mg | ORAL | Qty: 1 | Fill #0

## 2024-06-05 MED FILL — ROPINIROLE HCL 0.25 MG PO TABS: 0.25 mg | ORAL | Qty: 1 | Fill #0

## 2024-06-05 MED FILL — ACETAMINOPHEN 325 MG PO TABS: 325 mg | ORAL | Qty: 2 | Fill #0

## 2024-06-05 MED FILL — MONOJECT FLUSH SYRINGE 0.9 % IV SOLN: 0.9 % | INTRAVENOUS | Qty: 40 | Fill #0

## 2024-06-05 MED FILL — VITAMIN E 450 MG (1000 UT) PO CAPS: 450 MG (1000 UT) | ORAL | Qty: 1 | Fill #0

## 2024-06-05 MED FILL — VALSARTAN 160 MG PO TABS: 160 mg | ORAL | Qty: 1 | Fill #0

## 2024-06-05 MED FILL — IPRATROPIUM-ALBUTEROL 0.5-2.5 (3) MG/3ML IN SOLN: 0.5-2.5 (3) MG/3ML | RESPIRATORY_TRACT | Qty: 3 | Fill #0

## 2024-06-05 MED FILL — DONEPEZIL HCL 10 MG PO TABS: 10 mg | ORAL | Qty: 1 | Fill #0

## 2024-06-05 MED FILL — METHYLPREDNISOLONE SODIUM SUCC 125 MG IJ SOLR: 125 mg | INTRAMUSCULAR | Qty: 125 | Fill #0

## 2024-06-05 MED FILL — ENOXAPARIN SODIUM 40 MG/0.4ML IJ SOSY: 40 MG/0.4ML | INTRAMUSCULAR | Qty: 0.4 | Fill #0

## 2024-06-05 MED FILL — PREGABALIN 100 MG PO CAPS: 100 mg | ORAL | Qty: 2 | Fill #0

## 2024-06-05 MED FILL — DULOXETINE HCL 30 MG PO CPEP: 30 mg | ORAL | Qty: 1 | Fill #0

## 2024-06-05 NOTE — Plan of Care (Signed)
"    Problem: Safety - Adult  Goal: Free from fall injury  Flowsheets (Taken 06/05/2024 1827)  Free From Fall Injury: Instruct family/caregiver on patient safety     Problem: Chronic Conditions and Co-morbidities  Goal: Patient's chronic conditions and co-morbidity symptoms are monitored and maintained or improved  Flowsheets (Taken 06/05/2024 1827)  Care Plan - Patient's Chronic Conditions and Co-Morbidity Symptoms are Monitored and Maintained or Improved: Monitor and assess patient's chronic conditions and comorbid symptoms for stability, deterioration, or improvement     Problem: Discharge Planning  Goal: Discharge to home or other facility with appropriate resources  Flowsheets (Taken 06/05/2024 1827)  Discharge to home or other facility with appropriate resources:   Identify barriers to discharge with patient and caregiver   Arrange for needed discharge resources and transportation as appropriate     "

## 2024-06-05 NOTE — Consults (Signed)
 "Palliative Medicine  [name]  Date of Birth: 12-17-1923  MRN: [id number]  Age: 89 y.o.  Gender: female    Date of Initial Consult: 06/05/24  Date of Service: 06/05/2024  Time: 2:13 PM  Provider: Deitra Schimke, MD  Hospital Day: 2  Admit Date: 06/04/2024  Referring Provider: Dr Anders       Reasons for Consultation:  Goals of Care    HISTORY OF PRESENT ILLNESS (HPI):   Kimberly [name] is a 89 y.o. female with a past medical history of dementia, chronic hypoxic respiratory failure on prn O2, neuropathy, breast CA s/p lumpectomy. Recent admission to [facility] 10/10-10/15/25 then saw PCP and asked for another referral to Hospice as she graduated from [facility] 02/2024. Had [facility] for ~ 4 months.     Pt was admitted on 06/04/2024 from home with wheezing and SOB. CTA did not show acute findings. Started on solumedrol.     Psychosocial: Pt is widowed. She and her husband had one dtr, who is deceased.  Pt lives in her own home- her niece/caregiver Kimberly [name] lives w/ her full time.   She also has niece Kimberly [name] and nephew Kimberly [name] who are mPOA (on file).   DDNR on file.   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   See below for recent baseline.     PALLIATIVE DIAGNOSES:    Shortness of breath   Dementia- sounds mild/moderate  Generalized weakness/debility   Palliative care encounter     ASSESSMENT AND PLAN:   Along w/ Kimberly Dage LCSW meet w/ pt who is resting, niece/caregiver Kimberly [name] at bedside and gives history. She involves pt's other niece Kimberly [name] and nephew Kimberly [name] in any decisions as they are mPOA.   Recent baseline:  Pt lives in her own home and Kimberly [name] has lived with her for ~ 2 years as full time caretaker.   Pt is very loved by all- the matriarch of the family. She is widowed and their only child, dtr, is also deceased. Was very close to one of her brothers (Kimberly [name]'s dad) but he passed away earlier this year. One living sibling- brother in [place].   Has dementia but recognizes all  family, makes all needs/wants known and is quite active. She walks w/ a walker, does need some help w/ dressing, etc- and several times a week her church friends pick her up and they go to church, the movies, restaurants, etc. At home she is more sedentary and often sleeps.   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 Thus, losing function very hard on pt- when she couldn't drive it was a big loss, now it's hard for her to read as she can't see well and audio books hard as even w/ hearing aides sometimes cannot hear that well.   Current condition:  Being worked up for shortness of breath, CT chest w/out acute findings.   Pt had been on Hospice w/ [facility] 3-02/2024 but graduated. Was also evaluated by [facility] prior to [facility], but did not meet criteria.   Pt w/ SOB but no formal dx of COPD ,etc- Kimberly [name] states that had PFTs last week at a [facility] facility (I see documentation that she had them in Epic on 10/24 but cannot see report). So this may be helpful as family would like Hospice again but  does not have an end stage disease per se- but has had 2 hospitalizations with decline in function.   Code status:  DNR confirmed- DDNR on file.   Hospice to evaluate.   Please call with any palliative questions or concerns.  Palliative Care Team is available via perfect serve or via phone.    Referrals to:   []  Outpatient Palliative Care  []  Home Based Palliative Care  []  Home Based Primary Care  []  Hospice       ADVANCE CARE PLANNING:   []  The Pall Med Interdisciplinary Team has updated the ACP Navigator with Health Care Decision Maker and Patient Capacity      Primary Decision Maker: Kimberly [name] - Niece/Nephew - [phone]    Primary Decision Maker: Kimberly [name] - Niece/Nephew - [phone]    Secondary Decision Maker: Kimberly [name] [phone]  Confirm Advance Directive: Yes, on file    Current Code Status:  DNR     Goals of Care: Goals of Care and Interventions  Patient/Health Care Proxy Stated Goals: Recovery from acute illness       Please refer to Palliative Medicine ACP notes for further details.    PALLIATIVE ASSESSMENT:      Palliative Performance Scale (PPS):  PPS: 50    Pt resting.    Modified ESAS:  Modified-[place] Symptom Assessment Scale (ESAS)  Tiredness Score: 8  Pain Score: No pain  Dyspnea Score: 1    Clinical Pain Assessment (nonverbal scale for severity on nonverbal patients):   Clinical Pain Assessment  Severity: 0         Vital Signs: Blood pressure (!) 135/59, pulse 79, temperature 98.1 F (36.7 C), temperature source Oral, resp. rate 24, height 1.549 m (5' 1), weight 55.1 kg (121 lb 7.6 oz), SpO2 96%.    PHYSICAL ASSESSMENT:   General: []  Oriented x3  [x]  Well appearing, resting, appears much younger than stated age   []  Intubated  [] Ill appearing  [] Other:  Mental Status: []  Normal mental status exam  []  Drowsy  []  Confused  [] Other:  Cardiovascular: []  Regular rate/rhythm  []  Arrhythmia  []  Other:  Chest: [x]  Effort normal  [] Lungs clear  []  Respiratory distress  [] Tachypnea  []  Other:  Abdomen: []  Soft/non-tender  []  Normal appearance  []  Distended  []  Ascites  []  Other:  Neurological: []  Normal speech  []  Normal sensation  [] Deficits present:  Extremity: []  Normal skin color/temp  []  Clubbing/cyanosis  []  No edema  []  Other:    Wt Readings from Last 15 Encounters:   06/05/24 55.1 kg (121 lb 7.6 oz)   05/25/24 67 kg (147 lb 11.3 oz)   05/15/24 68.3 kg (150 lb 9.2 oz)   03/30/24 65.9 kg (145 lb 3.2 oz)   03/13/24 66 kg (145 lb 8.1 oz)   02/23/24 68 kg (150 lb)   02/21/24 68.4 kg (150 lb 12.7 oz)   12/08/23 67.2 kg (148 lb 2.4 oz)   09/03/23 66 kg (145 lb 6.4 oz)   07/17/23 68.3 kg (150 lb 9.6 oz)   04/17/23 66.2 kg (145 lb 15.1 oz)   03/28/23 68.2 kg (150 lb 5.7 oz)   03/25/23 67.6 kg (149 lb 0.5 oz)   03/20/23 67 kg (147 lb 9.6 oz)   01/15/23 66.2 kg (145 lb 15.1 oz)        Current Diet:  ADULT DIET; Regular  ADULT ORAL NUTRITION SUPPLEMENT; Breakfast; Other Oral Supplement; request       PSYCHOSOCIAL/SPIRITUAL SCREENING:  Palliative IDT has assessed this patient for cultural preferences / practices and a referral made as appropriate to needs Gaffer, Patient Advocacy, Ethics, etc.)    Spiritual Affiliation: Catholic    Any spiritual / religious concerns:  []  Yes /  [x]  No   If Yes to discuss with pastoral care during IDT     Does caregiver feel burdened by caring for their loved one:   []  Yes /  [x]  No /  []  No Caregiver Present/Available []  No Caregiver []  Pt Lives at Facility  If Yes to discuss with social work during IDT    Anticipatory grief assessment:   [x]  Normal  / []  Maladaptive     If Maladaptive to discuss with social work during IDT    ESAS Anxiety:      ESAS Depression:          LAB AND IMAGING FINDINGS:   Objective data reviewed:  labs, images, records, medication use, vitals, and chart     FINAL COMMENTS   Thank you for allowing Palliative Medicine to participate in the care of Kimberly [name].    Only check if applicable and billing time based rather than MDM  [x]  The total encounter time on this service date was __74__ minutes which was spent performing a face-to-face encounter and personally completing the provider-level activities documented in the note. This includes time spent prior to the visit and after the visit in direct care of the patient. This time does not include time spent in any separately reportable services.    Electronically signed by   Deitra Schimke, MD  Palliative Care Team  on 06/05/2024 at 2:13 PM      "

## 2024-06-05 NOTE — ED Notes (Signed)
 "ED TO INPATIENT SBAR HANDOFF    [name]   DOB:  09/25/1923  89 y.o.   MRN:  [id number]  ED Room #:  ER18/18     Situation  Code Status: DNR   Allergies: Penicillins and Sulfa antibiotics  Weight: Patient Vitals for the past 96 hrs (Last 3 readings):   Weight   06/05/24 0745 55.1 kg (121 lb 7.6 oz)   06/04/24 1544 68.9 kg (151 lb 14.4 oz)       Arrived from: home    Chief Complaint:   Chief Complaint   Patient presents with    Wheezing       Hospital Problem/Diagnosis:  Principal Problem:    Pulmonary fibrosis (HCC)  Active Problems:    Acute on chronic respiratory failure with hypoxia (HCC)  Resolved Problems:    * No resolved hospital problems. *      Mobility: limited transfer mobility   ED Fall Risk: Presents to emergency department  because of falls (Syncope, seizure, or loss of consciousness): No, Age > 70: Yes, Altered Mental Status, Intoxication with alcohol or substance confusion (Disorientation, impaired judgment, poor safety awaremess, or inability to follow instructions): No, Impaired Mobility: Ambulates or transfers with assistive devices or assistance; Unable to ambulate or transer.: Yes, Nursing Judgement: Yes   Fell in ED or prior to admission: no   Restraints: no     Sitter: no   Family/Caregiver Present: yes    Neet to know social/safety information: Fall risk     Background  History:   Past Medical History:   Diagnosis Date    Acute CVA (cerebrovascular accident) (HCC) 09/12/2022    Adverse effect of anesthesia     slow to wake    Anginal pain     Arthritis     Breast cancer (HCC) 03/10/2018     left lumpectomy    Cancer (HCC) 01/2018    breast    Chronic pain     neuropathy    Constipation     GERD (gastroesophageal reflux disease)     Headache     Hearing loss     High cholesterol     Hypertension     Incontinence     Joint pain     Memory disorder     Muscle pain     Osteoporosis     Ringing in the ears     Snoring     Visual disturbance        Assessment    Abnormal  Assessment Findings: Resp distress  Imaging:   CTA CHEST W WO CONTRAST   Final Result   No CT evidence of acute pulmonary embolism within limits of examination.               Electronically signed by Elia Mcalpine      XR CHEST (2 VW)   Final Result   Grossly clear lungs         Electronically signed by Krystal Grippe, MD        Abnormal labs:   Abnormal Labs Reviewed   CBC WITH AUTO DIFFERENTIAL - Abnormal; Notable for the following components:       Result Value    Hemoglobin 11.3 (*)     MCV 72.5 (*)     MCH 22.3 (*)     RDW 16.3 (*)     Neutrophils % 80.2 (*)     Lymphocytes % 9.4 (*)  Immature Granulocytes % 0.7 (*)     Lymphocytes Absolute 0.71 (*)     Immature Granulocytes Absolute 0.05 (*)     All other components within normal limits   COMPREHENSIVE METABOLIC PANEL - Abnormal; Notable for the following components:    BUN/Creatinine Ratio 24 (*)     Albumin 3.0 (*)     Globulin 4.5 (*)     Albumin/Globulin Ratio 0.7 (*)     All other components within normal limits   CBC WITH AUTO DIFFERENTIAL - Abnormal; Notable for the following components:    Hemoglobin 11.2 (*)     Hematocrit 34.3 (*)     MCV 70.1 (*)     MCH 22.9 (*)     RDW 16.1 (*)     Neutrophils % 92.0 (*)     Lymphocytes % 5.9 (*)     Monocytes % 1.2 (*)     Immature Granulocytes % 0.7 (*)     Lymphocytes Absolute 0.35 (*)     All other components within normal limits   COMPREHENSIVE METABOLIC PANEL - Abnormal; Notable for the following components:    Chloride 109 (*)     Glucose 165 (*)     BUN/Creatinine Ratio 24 (*)     Albumin 3.0 (*)     Globulin 4.1 (*)     Albumin/Globulin Ratio 0.7 (*)     All other components within normal limits   C-REACTIVE PROTEIN - Abnormal; Notable for the following components:    CRP 3.7 (*)     All other components within normal limits   MAGNESIUM  - Abnormal; Notable for the following components:    Magnesium  2.5 (*)     All other components within normal limits         Vitals/MEWS: MEWS Score: 1  Level of  Consciousness: Alert (0)   Vitals:    06/05/24 0600 06/05/24 0615 06/05/24 0745 06/05/24 1230   BP: (!) 133/56 128/63 (!) 186/89 (!) 135/59   Pulse: 74 75 90 84   Resp: 16 17 20 24    Temp:   97.7 F (36.5 C) 98.1 F (36.7 C)   TempSrc:   Oral Oral   SpO2: 98% 98% 97% 96%   Weight:   55.1 kg (121 lb 7.6 oz)    Height:         DI:   Predictive Model Details          44 (Caution)  Factor Value    Calculated 06/05/2024 12:39 42% Age 78 years old    Deterioration Index Model 25% Supplemental oxygen Nasal cannula     24% Respiratory rate 24     8% Sodium 144 mmol/L     7% Potassium 4.6 mmol/L     3% Systolic 135     0% Pulse 84     0% Hematocrit abnormal (34.3 %)     0% Pulse oximetry 96 %     0% Temperature 98.1 F (36.7 C)     0% WBC count 5.9 K/uL         FiO2 (%): Nasal cannula at baseline  O2 Flow Rate: O2 Device: Nasal cannula O2 Flow Rate (L/min): 2.5 L/min  Cardiac Rhythm:      Pain Scale: Pain Assessment  Pain Assessment: 0-10  Pain Level: 0  Patient's Stated Pain Goal: 0 - No pain  Functional Pain Assessment: Activities are not prevented  Pain Type: Acute pain  Pain Frequency: Intermittent  Pain Onset:  On-going  Non-Pharmaceutical Pain Intervention(s): Rest  Last documented pain score: (0-10 scale) Pain Level: 0  Last documented pain medication administered: NA     Mental Status: oriented, alert, coherent, and logical  Orientation Level:    NIH Score:    C-SSRS: Risk of Suicide: No Risk    PO Status: Regular  Bedside swallow: 3 oz Water  Swallow Screen: Pass  Glasgow Coma Scale (GCS): Glasgow Coma Scale  Eye Opening: Spontaneous  Best Verbal Response: Oriented  Best Motor Response: Obeys commands  Glasgow Coma Scale Score: 15    Active LDA's:   Peripheral IV 06/04/24 Left Antecubital (Active)   Site Assessment Clean, dry & intact 06/04/24 1616   Line Status Normal saline locked;Blood return noted;Capped;Flushed;Specimen collected 06/04/24 1616   Line Care Connections checked and tightened 06/04/24 1616    Phlebitis Assessment No symptoms 06/04/24 1616   Infiltration Assessment 0 06/04/24 1616   Alcohol Cap Used Yes 06/04/24 1616   Dressing Status Clean, dry & intact;New dressing applied 06/04/24 1616   Dressing Type Transparent 06/04/24 1616   Dressing Intervention New 06/04/24 1616     Pertinent or High Risk Medications/Drips: no   If Yes, please provide details: NA  Titratable drips: no   Pending Blood Product Administration: no     Sepsis: Sepsis Risk Score   Predictive Model Details          10 (Low)  Factor Value    Calculated 06/05/2024 12:35 12% O2 Delivery Method NASAL CANNULA    [facility] EARLY DETECTION OF SEPSIS VERSION 2 Model 9% Total Active Inpatient Meds 24     -8% Duration of Encounter .9 days     56% Age 52 years old     7% Absolute Lymphocytes 0.35 K/UL     4% Albumin 3.0 g/dL     4% Has Imaging Procedure present     -4% Change in Creatinine 0.82 MG/DL -> 9.27 MG/DL     3% Respirations 20     -3% Change in Absolute Lymphocytes 0.71 K/UL -> 0.35 K/UL      Recent Labs     06/04/24  1615 06/05/24  0342   WBC 7.5 5.9     Blood Cultures Drawn: No     Recommendation    Plan for next 24 hours: Onbserve, monitor 02 sats on exertion   Additional Comments: N/A     Isolation:Patient no longer requires any isolation precautions.  Pending orders: Observation  Consults ordered: IP CONSULT TO PALLIATIVE CARE  IP CONSULT TO HOSPICE    Consulted provider:      If any further questions, please call Sending RN at [phone]  Electronically signed by: Electronically signed by Rosina Conception, RN on 06/05/2024 at 12:39 PM    "

## 2024-06-05 NOTE — Progress Notes (Signed)
"  Palliative Medicine      Code Status: DNR    Advance Care Planning:    Patient has an AMD on file naming both her niece Rock and nephew Laurel as joint primary MPOA - secondary is Administrator, Civil Service.     Patient also has a DDNR on file     Patient / Family Encounter Documentation    Participants (names): Dr. Tonnie, patient's caregiver Regina/niece    Narrative: The Palliative Medicine SW and Dr. Tonnie met with the patient at bedside (sleeping - does not awake) along with her niece/caregiver Angeline. Psychosocial history obtained - see below. Patient was actually on hospice care previously - Had hospice March-July with [facility], however, she graduated in July. Agency shared she graduated/did not meet criteria due to clinical status. Angeline shares that at home she was having pain in her leg and more SOB - which prompted hospital stay. She is hoping to get hospice again at home if patient can qualify - as family verbalizes good experience with their care and support services.     At baseline, patient needs assistance w/ ADLs, however, ambulating w/ a walker and able to go out in community w/ her church family. Angeline shares some forgetfulness at times, but overall dementia sounds mild.     Angeline shares that they recently got pulmonary functioning test done but no report yet - Pulmonary consultation pending.     We spoke w/ Angeline - she is open that Rock and Laurel are MPOA, case management has spoken w/ Rock regarding hospice consult and referral sent. DNR already signed in chart so will have placed in system & Angeline confirms DNR status.     Family has our contact information - continue current care, DNR and hospice consult.     Psychosocial Issues Identified/ Resilience Factors: The patient lives at home, her niece Angeline lives at home w/ her and is her primary caregiver. The patient is widowed, and her daughter also has passed away. The patient is the matriarch of the family - she has strong support system, nieces /  nephews - she has one living brother who is 57 and lives in [place] , her brother that she was very close to recently passed away in November 23, 2024 (Regina's father). The patient is still quite active 89 year old - she is a member of [employer]. Angeline shares that her church family keeps her involved and takes her out to community events including movies, restaurants, etc. She has a strong church family. 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 She was a board member of her sorority [employer]     Caregiver Burden:   Does the caregiver feel confident administering medication? Family assists   Does the caregiver need any help connecting with community resources? Hospice consultation   Does the caregiver feel confident assisting with activities of daily living? Needs assistance w/ ADLs, able to ambulate w/ walker & still active out In community     Goals of Care / Plan: DNR, continue current care     Thank you for including Palliative Medicine in the care of [facility] [place], [place]    "

## 2024-06-05 NOTE — Care Coordination (Addendum)
"  Transition of Care:    Patient new to floor; Anticipated d/c home tomorrow per attending.     CM received message from [facility], Accept After extensive conversations with caregiver and MPOA Rock, they have decided to go back home with [facility] home health. They will reach out if anything changes. Thank you for the referral.    CM met with patient and family and confirmed the above, they would like to d/c home with [facility] (SN, PT, OT); CM placed referral in Careport.   - accepted and added to AVS    --  Patient lives at home and Angeline has been her 24 hour caregiver. This pt was open to [facility] until July 2025. She is open to [facility] currently. This pt has a rollator, BSC, overbead tray, transfer chair, WC, walk in shower with a seat; has a concentrator for PRN oxygen; has a great deal of support from her church family as well as family.    Transition of Care Plan:    RUR:  17%  Prior Level of Functioning: home with HH  Disposition: home with [facility]  EDD: 11/3  If SNF or IPR: Date Freedom of Choice offered:   Date Freedom of Choice received:   Accepting facility:   Date authorization started with reference number:   Date authorization received and expires:   Follow up appointments: defer to AVS  DME needed: likely none  Transportation at discharge:   Ambulance transport?  Pt and family notified of potential financial responsibility for transport?          Who?  IM/IMM Medicare/Tricare letter given:   Is patient a Veteran and connected with VA?    If yes, was [employer] transfer form completed and VA notified?   Caregiver Contact: niece Angeline [phone]   Discharge Caregiver contacted prior to discharge? y  Care Conference needed?   Barriers to discharge: clinical    "

## 2024-06-05 NOTE — Care Coordination (Addendum)
"  Ambulatory Care Coordination Note     06/04/2024     Patient outreach attempt by this ACM today to perform care management follow up . ACM was unable to reach the caregiver,   by telephone today;   left voice message requesting a return phone call to this ACM.     ACM: Tilton CHRISTELLA Bailey, RN     Care Summary Note: Patient was admitted to [facility] for evaluation.   Patient admitted, ACM will follow up at discharge.  PCP/Specialist follow up:   Future Appointments         Provider Specialty Dept Phone    07/27/2024 2:00 PM Kennie Darin MATSU, MD Family Medicine [phone]    03/31/2025 2:00 PM Claudene Debby LABOR, MD Neurology [phone]            Follow Up:   Plan for next ACM outreach in approximately 2 weeks to complete:  - CC Protocol assessments  - disease specific assessments  - medication review  - goal progression  - education   - hospital follow up.             ACM  contacted the caregiver by telephone. Verified name and DOB with caregiver as identifiers.         ACM: Tilton CHRISTELLA Bailey, RN       "

## 2024-06-05 NOTE — Progress Notes (Signed)
 "                                                       Hospitalist Progress Note                               Ellouise Bathe, MD                                     Answering service: [phone]                               OR [phone] from in house phone                                         Date of Service:  06/05/2024  NAME:  Kimberly [name]  DOB:  1923/11/14  MRN:  [id number]      Admission Summary:   Kimberly [name] is a 89 y.o. female with past medical history significant for left breast cancer with resultant left lumpectomy, neuropathy, hypertension, dementia presented to the emergency room with shortness of breath.  Patient has chronic respiratory failure and on 2 L of oxygen at home.  There was no fever, rigors or chills reported.  Patient lives at home and cared for by her nieces.  Patient is not able to provide history because of underlying dementia and advanced age.  The patient shortness of breath is worse with mild exertion even when the patient is moving in bed and has increased work of breathing with activity     Reason for follow up:    SOB resting comfortably in bed on 2.5 L of NC.     Assessment & Plan:     1.  Acute on chronic respiratory failure with hypoxia POA: Patient saturating well on 2.5 L which is what she uses at home. Her niece is  at bedside who tells me she gets SOB with exertion. CTA  showed Peripheral scarring in the left lung. Dependent atelectasis.  Incentive spirometry ordered. Patient is meeting with Hospice at this time. POA is Rock she will make decision today about disposition.  2.  Dementia : Will continue Aricept  and supportive therapy.     3.  Neuropathy : Will resume preadmission medications  4.  Essential hypertension POA: Will resume preadmission medication and monitor blood pressure closely  5.  Restless leg syndrome POA: Will continue Requip   6. Anemia: Stable               Diet:Regular   Code status: DNR  DVT prophylaxis: Lovenox   Care Plan discussed with: Niece and  patient   Patient has given Verbal permission to discuss medical care with   persons present in the room and and also with contact as listed on face sheet.   Discharge planning/disposition:TBD          Review of Systems:   Pertinent items are noted in HPI.     Physical Examination:      Last 24hrs VS reviewed since prior  progress note. Most recent are:  Vitals:    06/05/24 0745   BP: (!) 186/89   Pulse: 90   Resp: 20   Temp: 97.7 F (36.5 C)   SpO2: 97%           Constitutional:  No acute distress, cooperative, pleasant    HEENT: Head is a traumatic,  Un icteric sclera.Pink conjunctiva,no erythema or discharge.Oral mucous moist, oropharynx benign. Neck supple,    Resp:  CTA bilaterally. No wheezing/rhonchi/rales. No accessory muscle use   CV:  Regular rhythm, normal rate, no murmurs, gallops, rubs    GI:  Soft, non distended, non tender. normoactive bowel sounds, no hepatosplenomegaly    GU:  No CVA or suprapubic tenderness   Skin  :  No erythema,rash,bullae,dipigmentation     Musculoskeletal:  No edema, warm, 2+ pulses throughout    Neurologic:  AAOx3, CN II-XII reviewed.Moves all extremities.     Skin:  Good turgor, no rashes or ulcers     No intake or output data in the 24 hours ending 06/05/24 1214       Data Review:    Review and/or order of clinical lab test      Labs:     Recent Labs     06/04/24  1615 06/05/24  0342   WBC 7.5 5.9   HGB 11.3* 11.2*   HCT 36.7 34.3*   PLT 294 275     Recent Labs     06/04/24  1615 06/05/24  0342   NA 141 144   K 4.6 4.6   CL 105 109*   CO2 26 25   BUN 19 18   MG  --  2.5*   PHOS  --  3.5     Recent Labs     06/04/24  1615 06/05/24  0342   ALT 16 17   GLOB 4.5* 4.1*     No results for input(s): INR, APTT in the last 72 hours.    Invalid input(s): PTP   No results for input(s): TIBC in the last 72 hours.    Invalid input(s): FE, PSAT, FERR   No results found for: RBCF   No results for input(s): PH, PCO2, PO2 in the last 72 hours.  No results for input(s):  CPK in the last 72 hours.    Invalid input(s): CPKMB, CKNDX, TROIQ  Lab Results   Component Value Date/Time    CHOL 134 09/13/2022 05:45 AM    HDL 54 09/13/2022 05:45 AM    LDL 69.4 09/13/2022 05:45 AM     No results found for: GLUCPOC  @LABUA @      Medications Reviewed:     Current Facility-Administered Medications   Medication Dose Route Frequency    ascorbic acid  (VITAMIN C ) tablet 500 mg  500 mg Oral Daily    donepezil  (ARICEPT ) tablet 10 mg  10 mg Oral Daily before dinner    DULoxetine  (CYMBALTA ) extended release capsule 30 mg  30 mg Oral Daily before dinner    pregabalin  (LYRICA ) capsule 200 mg  200 mg Oral BID    rOPINIRole  (REQUIP ) tablet 0.25 mg  0.25 mg Oral Nightly    valsartan  (DIOVAN ) tablet 160 mg  160 mg Oral Daily    vitamin E  capsule 1,000 Units  1,000 Units Oral Daily    sodium chloride  flush 0.9 % injection 5-40 mL  5-40 mL IntraVENous 2 times per day    sodium chloride  flush 0.9 % injection 5-40 mL  5-40 mL IntraVENous PRN    0.9 % sodium chloride  infusion   IntraVENous PRN    potassium chloride  (KLOR-CON ) extended release tablet 40 mEq  40 mEq Oral PRN    Or    potassium bicarb-citric acid  (EFFER-K) effervescent tablet 40 mEq  40 mEq Oral PRN    Or    potassium chloride  10 mEq/100 mL IVPB (Peripheral Line)  10 mEq IntraVENous PRN    magnesium  sulfate 2000 mg in 50 mL IVPB premix  2,000 mg IntraVENous PRN    enoxaparin  (LOVENOX ) injection 40 mg  40 mg SubCUTAneous Daily    ondansetron  (ZOFRAN -ODT) disintegrating tablet 4 mg  4 mg Oral Q8H PRN    Or    ondansetron  (ZOFRAN ) injection 4 mg  4 mg IntraVENous Q6H PRN    polyethylene glycol (GLYCOLAX ) packet 17 g  17 g Oral Daily PRN    acetaminophen  (TYLENOL ) tablet 650 mg  650 mg Oral Q6H PRN    Or    acetaminophen  (TYLENOL ) suppository 650 mg  650 mg Rectal Q6H PRN    oxyCODONE  (ROXICODONE ) immediate release tablet 2.5 mg  2.5 mg Oral Q6H PRN    morphine  (PF) injection 1 mg  1 mg IntraVENous Q4H PRN    ipratropium 0.5 mg-albuterol  2.5 mg  (DUONEB ) nebulizer solution 1 Dose  1 Dose Inhalation Q4H PRN     Current Outpatient Medications   Medication Sig    acetaminophen -codeine  (TYLENOL  #3) 300-30 MG per tablet Take 1 tablet by mouth every 6 hours as needed for Pain.    fluticasone (FLONASE) 50 MCG/ACT nasal spray 1 spray by Nasal route daily as needed for Rhinitis or Allergies    oxyCODONE -acetaminophen  (PERCOCET) 5-325 MG per tablet Take 1 tablet by mouth every 8 hours as needed for Pain for up to 14 days. Intended supply: 3 days. Take lowest dose possible to manage pain Max Daily Amount: 3 tablets    pregabalin  (LYRICA ) 200 MG capsule TAKE 1 CAPSULE BY MOUTH TWICE DAILY. MAX DAILY AMOUNT: 400 MG    valsartan  (DIOVAN ) 160 MG tablet Take 1 tablet by mouth daily    donepezil  (ARICEPT ) 10 MG tablet Take 1 tablet by mouth daily (before dinner)    DULoxetine  (CYMBALTA ) 30 MG extended release capsule Take 1 capsule by mouth daily (before dinner)    docusate sodium  (COLACE) 100 MG capsule Take 1 capsule by mouth daily    DULoxetine  (CYMBALTA ) 60 MG extended release capsule Take 1 capsule by mouth daily    albuterol  (PROVENTIL ) (2.5 MG/3ML) 0.083% nebulizer solution Take 3 mLs by nebulization 4 times daily as needed for Wheezing    cetirizine  (ZYRTEC ) 5 MG tablet Take 1 tablet by mouth daily    rOPINIRole  (REQUIP ) 0.25 MG tablet Take 1 tablet by mouth nightly    Zinc 30 MG CAPS Take by mouth    Omega-3 Fatty Acids (FISH OIL) 1000 MG CPDR Take 3 capsules by mouth    chlorhexidine (PERIDEX) 0.12 % solution Swish and spit 15 mLs 2 times daily    vitamin E  1000 units capsule Take 1 capsule by mouth daily    Cholecalciferol (VITAMIN D3) 50 MCG (2000 UT) CAPS Take 1 capsule by mouth daily    ammonium lactate (LAC-HYDRIN) 12 % lotion Apply 1 Application topically as needed in the morning and 1 Application as needed in the evening.    ascorbic acid  (VITAMIN C ) 100 MG tablet Take 1 tablet by mouth daily    acetaminophen  (TYLENOL ) 500 MG tablet  Take by mouth every 6  hours as needed for Pain     ______________________________________________________________________  EXPECTED LENGTH OF STAY: 3  ACTUAL LENGTH OF STAY:          1                 Ellouise Bathe, MD     "

## 2024-06-05 NOTE — Care Coordination (Signed)
"     06/05/24 1028   Readmission Assessment   Who is being Kimberly [name]  Kimberly [name])   Number of Days since last admission? 8-30 days   Previous Disposition Home with Home Health   What was the patient's/caregiver's perception as to why they think they needed to return back to the hospital? [name] (Comment)  (This was a necessary re-admission.)   Did you visit your Primary Care Physician after you left the hospital, before you returned this time? Yes   Did you see a specialist, such as Cardiac, Pulmonary, Orthopedic Physician, etc. after you left the hospital? No   Who advised the patient to return to the hospital? Self-referral   Does the patient report anything that got in the way of taking their medications? No   What could we have done to help prevent your return to the hospital? [name] (Comment)  (nothing)   Have You Received a Follow Up Phone Call from a HealthCare Provider After Discharge? No     Danuta Huseman,BSW,ACM-SW  "

## 2024-06-05 NOTE — Progress Notes (Signed)
"  Spoke with Rock BROTHERS she will come to hospital today and decide about Hospice  "

## 2024-06-05 NOTE — ED Notes (Signed)
"  Report received from [place], [place]  "

## 2024-06-05 NOTE — Care Coordination (Signed)
"  Care Management Initial Assessment       RUR:17%  Readmission? Yes   1st IM letter given? No-To be given by Pt. Reg.  1st [employer] letter given: No      06/05/24 0946   Service Assessment   Information Provided By Child/Family   Patient Orientation Unable to Assess  (Assessment completed with the niece, Angeline)   Cognition Short Term Memory Deficit   Primary Caregiver Family   Support Systems Family Members   PCP Verified by CM Yes   Last Visit to PCP Within last 3 months   Prior Functional Level Assistance with the following:;Bathing;Dressing;Toileting;Mobility   History of falls? 0   Current Functional Level at Time of Initial Assessment Other (see comment)  (TBD)   Can patient return to prior living arrangement Unknown at present   Ability to make needs known: Fair   Family able to assist with home care needs: Yes   Receives Help From Family;Home health     CM met with this pt and her niece, [name] [phone]) to introduce them to the role of CM and transition of care. This pt lives at home and Angeline has been her 24 hour caregiver. This pt was open to [facility] until July 2025. She is open to [facility] currently. This pt has a rollator, BSC, overbead tray, transfer chair, WC, walk in shower with a seat. She also has a concentrator for PRN oxygen. This pt has a great deal of support from her church family as well as family.    CM informed the niece that there is a hospice consult for this pt. She asked that this CM call her cousin, Rock Stakes [phone]) for choice. CM did call Rock [phone]) for hospice choice and she would like to use [facility]. CM sent a referral to [facility] via [place]. Jimia Gentles,BSW,ACM-SW  "

## 2024-06-05 NOTE — ED Notes (Signed)
"  Pt placed back on her standard 2.5L o2 via NC  "

## 2024-06-06 LAB — CBC WITH AUTO DIFFERENTIAL
Basophils %: 0.3 % (ref 0.0–1.0)
Basophils Absolute: 0.03 K/UL (ref 0.00–0.10)
Eosinophils %: 0.3 % (ref 0.0–7.0)
Eosinophils Absolute: 0.03 K/UL (ref 0.00–0.40)
Hematocrit: 30.8 % — ABNORMAL LOW (ref 35.0–47.0)
Hemoglobin: 9.7 g/dL — ABNORMAL LOW (ref 11.5–16.0)
Immature Granulocytes %: 0.5 % (ref 0.0–0.5)
Immature Granulocytes Absolute: 0.05 K/UL — ABNORMAL HIGH (ref 0.00–0.04)
Lymphocytes %: 8.7 % — ABNORMAL LOW (ref 12.0–49.0)
Lymphocytes Absolute: 0.81 K/UL (ref 0.80–3.50)
MCH: 22.5 pg — ABNORMAL LOW (ref 26.0–34.0)
MCHC: 31.5 g/dL (ref 30.0–36.5)
MCV: 71.3 FL — ABNORMAL LOW (ref 80.0–99.0)
MPV: 11.7 FL (ref 8.9–12.9)
Monocytes %: 7.7 % (ref 5.0–13.0)
Monocytes Absolute: 0.71 K/UL (ref 0.00–1.00)
Neutrophils %: 82.5 % — ABNORMAL HIGH (ref 32.0–75.0)
Neutrophils Absolute: 7.63 K/UL (ref 1.80–8.00)
Nucleated RBCs: 0 /100{WBCs}
Platelets: 290 K/uL (ref 150–400)
RBC: 4.32 M/uL (ref 3.80–5.20)
RDW: 15.6 % — ABNORMAL HIGH (ref 11.5–14.5)
WBC: 9.3 K/uL (ref 3.6–11.0)
nRBC: 0 K/uL (ref 0.00–0.01)

## 2024-06-06 LAB — COMPREHENSIVE METABOLIC PANEL
ALT: 20 U/L (ref 10–35)
AST: 33 U/L (ref 10–35)
Albumin/Globulin Ratio: 0.8 — ABNORMAL LOW (ref 1.1–2.2)
Albumin: 2.6 g/dL — ABNORMAL LOW (ref 3.5–5.2)
Alk Phosphatase: 85 U/L (ref 35–104)
Anion Gap: 9 mmol/L (ref 2–14)
BUN/Creatinine Ratio: 36 — ABNORMAL HIGH (ref 12–20)
BUN: 25 mg/dL — ABNORMAL HIGH (ref 8–23)
CO2: 23 mmol/L (ref 20–29)
Calcium: 8.8 mg/dL (ref 8.2–9.6)
Chloride: 106 mmol/L (ref 98–107)
Creatinine: 0.69 mg/dL (ref 0.60–1.00)
Est, Glom Filt Rate: 79 ml/min/1.73m2 (ref >59)
Globulin: 3.2 g/dL (ref 2.0–4.0)
Glucose: 98 mg/dL (ref 65–100)
Potassium: 4.8 mmol/L (ref 3.5–5.1)
Sodium: 138 mmol/L (ref 136–145)
Total Bilirubin: <0.2 mg/dL (ref 0.0–1.2)
Total Protein: 5.8 g/dL — ABNORMAL LOW (ref 6.4–8.3)

## 2024-06-06 MED FILL — IPRATROPIUM-ALBUTEROL 0.5-2.5 (3) MG/3ML IN SOLN: 0.5-2.5 (3) MG/3ML | RESPIRATORY_TRACT | Qty: 3 | Fill #0

## 2024-06-06 MED FILL — PREGABALIN 100 MG PO CAPS: 100 mg | ORAL | Qty: 2 | Fill #0

## 2024-06-06 MED FILL — DULOXETINE HCL 30 MG PO CPEP: 30 mg | ORAL | Qty: 1 | Fill #0

## 2024-06-06 MED FILL — ACETAMINOPHEN 325 MG PO TABS: 325 mg | ORAL | Qty: 2 | Fill #0

## 2024-06-06 MED FILL — ASCORBIC ACID 500 MG PO TABS: 500 mg | ORAL | Qty: 1 | Fill #0

## 2024-06-06 MED FILL — VALSARTAN 160 MG PO TABS: 160 mg | ORAL | Qty: 1 | Fill #0

## 2024-06-06 MED FILL — VITAMIN E 450 MG (1000 UT) PO CAPS: 450 MG (1000 UT) | ORAL | Qty: 1 | Fill #0

## 2024-06-06 MED FILL — ENOXAPARIN SODIUM 40 MG/0.4ML IJ SOSY: 40 MG/0.4ML | INTRAMUSCULAR | Qty: 0.4 | Fill #0

## 2024-06-06 MED FILL — DONEPEZIL HCL 10 MG PO TABS: 10 mg | ORAL | Qty: 1 | Fill #0

## 2024-06-06 NOTE — Progress Notes (Addendum)
 "                                                       Hospitalist Progress Note                               Kathrine Pac, MD                                     Answering service: [phone]                               OR [phone] from in house phone                                         Date of Service:  06/06/2024  NAME:  Kimberly [name]  DOB:  [birth year]  MRN:  [id number]      Admission Summary:   Kimberly [name] is a 89 y.o. female with past medical history significant for left breast cancer with resultant left lumpectomy, neuropathy, hypertension, dementia presented to the emergency room with shortness of breath.  Patient has chronic respiratory failure and on 2 L of oxygen at home.  There was no fever, rigors or chills reported.  Patient lives at home and cared for by her nieces.  Patient is not able to provide history because of underlying dementia and advanced age.  The patient shortness of breath is worse with mild exertion even when the patient is moving in bed and has increased work of breathing with activity     Reason for follow up:   10-31-25SOB resting comfortably in bed on 2.5 L of NC.     06-06-24 Patient hard of hearing.  Comfortable while resting in bed.  Admitted for acute on chronic respiratory failure with hypoxia.  Temperature 98 degrees, respiratory rate 27/min, heart rate 89/min, blood pressure 165 x 82.  Oxygen saturation 92% on room air.  Today's labs potassium 4.8.  CBC shows a hemoglobin of 9.7 with a hematocrit of 30.8.  Baseline 1 month ago was 11.7/38.2.Continues to have elevated neutrophil percentage.  POC glucose testing 115.  B12 855.  Folate 12.0.  TSH 0.634 troponin 11.0.  CTA from October 30 shows calcified lymph nodes in the mediastinum.  Also present in the hilar area.  No PE.  Chest x-ray grossly clear lung fields.  Chronic compression fracture at T9, T10.  Subpleural fibrotic changes left upper lobe.    06-07-24 Patient is hard of hearing.  In the bed.  No distress.   Family does not want hospice at this time.  Home health on board for continued care.  Patient is a residence.  Discussed with nursing.  Patient ate her dinner without significant assistance.  Currently saturating normal on home oxygen.  Vitals today show temperature 98.5, respiratory 16/min, heart rate 70/min, blood pressure 137 x 67..  Care coordination notes were reviewed.  Patient has pending future appointments with her family medicine physician.  Discussed with nursing.  BUN 25 serum creatinine 0.69.  Total protein 5.8 albumin 2.6.  Patient has a steadily declining hemoglobin of 9.7 with hematocrit of 30.8.  Overall stable compared to October 14 numbers.  Microcytic hypochromic picture.  Poor candidate for any extensive, aggressive diagnostic or therapeutic interventions.  Assessment & Plan:     1.  Acute on chronic respiratory failure with hypoxia POA: Patient saturating well on 2.5 L which is what she uses at home. Her niece is  at bedside who tells me she gets SOB with exertion. CTA  showed Peripheral scarring in the left lung. Dependent atelectasis.  Incentive spirometry ordered. Patient is meeting with Hospice at this time. POA is Rock she will make decision today about disposition.  2.  Dementia : Will continue Aricept  and supportive therapy.     3.  Neuropathy : Will resume preadmission medications  4.  Essential hypertension POA: Will resume preadmission medication and monitor blood pressure closely  5.  Restless leg syndrome POA: Will continue Requip   6. Anemia: Stable , Iron and Multivitamin supplements  7. Compression fracture D9, D10: As needed Tylenol   8. NSVTach: Palliative and comfort care          Diet:Regular   Code status: DNR  DVT prophylaxis: Lovenox   Care Plan discussed with: Niece and patient   Patient has given Verbal permission to discuss medical care with   persons present in the room and and also with contact as listed on face sheet.   Discharge  planning/disposition:TBD          Review of Systems:   Pertinent items are noted in HPI.     Physical Examination:      Last 24hrs VS reviewed since prior progress note. Most recent are:  Vitals:    06/06/24 0645   BP:    Pulse: 89   Resp: 27   Temp: 98 F (36.7 C)   SpO2: 92%           Constitutional:  No acute distress, cooperative, pleasant    HEENT: Head is a traumatic,  Un icteric sclera.Pink conjunctiva,no erythema or discharge.Oral mucous moist, oropharynx benign. Neck supple,    Resp:  CTA bilaterally. No wheezing/rhonchi/rales. No accessory muscle use   CV:  Regular rhythm, normal rate, no murmurs, gallops, rubs    GI:  Soft, non distended, non tender. normoactive bowel sounds, no hepatosplenomegaly    GU:  No CVA or suprapubic tenderness   Skin  :  No erythema,rash,bullae,dipigmentation     Musculoskeletal:  No edema, warm, 2+ pulses throughout    Neurologic:  AAOx3, CN II-XII reviewed.Moves all extremities.     Skin:  Good turgor, no rashes or ulcers       Intake/Output Summary (Last 24 hours) at 06/06/2024 0723  Last data filed at 06/06/2024 0656  Gross per 24 hour   Intake 300 ml   Output 600 ml   Net -300 ml          Data Review:    Review and/or order of clinical lab test      Labs:     Recent Labs     06/05/24  0342 06/06/24  0035   WBC 5.9 9.3   HGB 11.2* 9.7*   HCT 34.3* 30.8*   PLT 275 290     Recent Labs     06/04/24  1615 06/05/24  0342 06/06/24  0035   NA 141 144 138   K 4.6 4.6 4.8   CL 105 109* 106  CO2 26 25 23    BUN 19 18 25*   MG  --  2.5*  --    PHOS  --  3.5  --      Recent Labs     06/04/24  1615 06/05/24  0342 06/06/24  0035   ALT 16 17 20    GLOB 4.5* 4.1* 3.2     No results for input(s): INR, APTT in the last 72 hours.    Invalid input(s): PTP   No results for input(s): TIBC in the last 72 hours.    Invalid input(s): FE, PSAT, FERR   No results found for: RBCF   No results for input(s): PH, PCO2, PO2 in the last 72 hours.  No results for input(s): CPK in the  last 72 hours.    Invalid input(s): CPKMB, CKNDX, TROIQ  Lab Results   Component Value Date/Time    CHOL 134 09/13/2022 05:45 AM    HDL 54 09/13/2022 05:45 AM    LDL 69.4 09/13/2022 05:45 AM     No results found for: GLUCPOC  @LABUA @      Medications Reviewed:     Current Facility-Administered Medications   Medication Dose Route Frequency    ascorbic acid  (VITAMIN C ) tablet 500 mg  500 mg Oral Daily    donepezil  (ARICEPT ) tablet 10 mg  10 mg Oral Daily before dinner    DULoxetine  (CYMBALTA ) extended release capsule 30 mg  30 mg Oral Daily before dinner    pregabalin  (LYRICA ) capsule 200 mg  200 mg Oral BID    rOPINIRole  (REQUIP ) tablet 0.25 mg  0.25 mg Oral Nightly    valsartan  (DIOVAN ) tablet 160 mg  160 mg Oral Daily    vitamin E  capsule 1,000 Units  1,000 Units Oral Daily    sodium chloride  flush 0.9 % injection 5-40 mL  5-40 mL IntraVENous 2 times per day    sodium chloride  flush 0.9 % injection 5-40 mL  5-40 mL IntraVENous PRN    0.9 % sodium chloride  infusion   IntraVENous PRN    potassium chloride  (KLOR-CON ) extended release tablet 40 mEq  40 mEq Oral PRN    Or    potassium bicarb-citric acid  (EFFER-K) effervescent tablet 40 mEq  40 mEq Oral PRN    Or    potassium chloride  10 mEq/100 mL IVPB (Peripheral Line)  10 mEq IntraVENous PRN    magnesium  sulfate 2000 mg in 50 mL IVPB premix  2,000 mg IntraVENous PRN    enoxaparin  (LOVENOX ) injection 40 mg  40 mg SubCUTAneous Daily    ondansetron  (ZOFRAN -ODT) disintegrating tablet 4 mg  4 mg Oral Q8H PRN    Or    ondansetron  (ZOFRAN ) injection 4 mg  4 mg IntraVENous Q6H PRN    polyethylene glycol (GLYCOLAX ) packet 17 g  17 g Oral Daily PRN    acetaminophen  (TYLENOL ) tablet 650 mg  650 mg Oral Q6H PRN    Or    acetaminophen  (TYLENOL ) suppository 650 mg  650 mg Rectal Q6H PRN    oxyCODONE  (ROXICODONE ) immediate release tablet 2.5 mg  2.5 mg Oral Q6H PRN    morphine  (PF) injection 1 mg  1 mg IntraVENous Q4H PRN    ipratropium 0.5 mg-albuterol  2.5 mg (DUONEB )  nebulizer solution 1 Dose  1 Dose Inhalation Q4H PRN    ipratropium 0.5 mg-albuterol  2.5 mg (DUONEB ) nebulizer solution 1 Dose  1 Dose Inhalation 4x Daily RT     ______________________________________________________________________  EXPECTED LENGTH OF STAY: 3  ACTUAL LENGTH OF STAY:          2                 Kathrine Pac, MD     "

## 2024-06-06 NOTE — Progress Notes (Addendum)
"  About 1305, [facility] called to report pt had 9 beat of VT.  Came to assess patient.  Found pt sleeping.  Woke pt up.  Pt denied pain, SOB, or any other distress. VSS.  Looked at EKG strip and found WCT in lead 2 but not lead 5.Will notify provider.   "

## 2024-06-06 NOTE — Discharge Summary (Signed)
 "                                                                                                                         Discharge Summary       PATIENT ID: Kimberly [name]  MRN: [id number]   DATE OF BIRTH: March 16, 1924    DATE OF ADMISSION: 06/04/2024  3:54 PM    DATE OF DISCHARGE: 06-07-24  PRIMARY CARE PROVIDER: Kennie Darin MATSU, MD     ATTENDING PHYSICIAN: Kathrine Pac  DISCHARGING PROVIDER: Kathrine Pac, MD    To contact this individual call [phone] and ask the operator to page.  If unavailable ask to be transferred the Adult Hospitalist Department.    CONSULTATIONS: IP CONSULT TO PALLIATIVE CARE  IP CONSULT TO HOSPICE  IP CONSULT TO CASE MANAGEMENT    PROCEDURES/SURGERIES: * No surgery found *    ADMITTING DIAGNOSES & HOSPITAL COURSE:   06-04-24 Kimberly [name] is a 89 y.o. female with past medical history significant for left breast cancer with resultant left lumpectomy, neuropathy, hypertension, dementia presented to the emergency room with shortness of breath. Patient has chronic respiratory failure and on 2 L of oxygen at home. There was no fever, rigors or chills reported. Patient lives at home and cared for by her nieces. Patient is not able to provide history because of underlying dementia and advanced age. The patient shortness of breath is worse with mild exertion even when the patient is moving in bed and has increased work of breathing with activity       10-31-25SOB resting comfortably in bed on 2.5 L of NC.     06-06-24 Patient hard of hearing.  Comfortable while resting in bed.  Admitted for acute on chronic respiratory failure with hypoxia.  Temperature 98 degrees, respiratory rate 27/min, heart rate 89/min, blood pressure 165 x 82.  Oxygen saturation 92% on room air.  Today's labs potassium 4.8.  CBC shows a hemoglobin of 9.7 with a hematocrit of 30.8.  Baseline 1 month ago was 11.7/38.2.Continues to have elevated neutrophil percentage.  POC glucose testing 115.  B12 855.  Folate 12.0.   TSH 0.634 troponin 11.0.  CTA from October 30 shows calcified lymph nodes in the mediastinum.  Also present in the hilar area.  No PE.  Chest x-ray grossly clear lung fields.  Chronic compression fracture at T9, T10.  Subpleural fibrotic changes left upper lobe.            DISCHARGE DIAGNOSES / PLAN:       Acute on chronic respiratory failure , pulmonary fibrosis,    1.  Acute on chronic respiratory failure with hypoxia POA: Patient saturating well on 2.5 L which is what she uses at home. Her niece is  at bedside who tells me she gets SOB with exertion. CTA  showed Peripheral scarring in the left lung. Dependent atelectasis.  Incentive spirometry ordered. Patient is meeting with Hospice at this time. POA is Rock she  will make decision today about disposition.  2.  Dementia : Will continue Aricept  and supportive therapy.     3.  Neuropathy : Will resume preadmission medications  4.  Essential hypertension POA: Will resume preadmission medication and monitor blood pressure closely  5.  Restless leg syndrome POA: Will continue Requip   6. Anemia: Stable   7. Compression fracture D9, D10: As needed Tylenol   8. NSVTach: Palliative and comfort care    PENDING TEST RESULTS:   At the time of discharge the following test results are still pending: NA    FOLLOW UP APPOINTMENTS:    @DCFOLLOWUP @     ADDITIONAL CARE RECOMMENDATIONS: Hospice to follow up at home for comfort measures    DIET: regular diet  Oral Nutritional Supplements: NA    ACTIVITY: activity as tolerated    WOUND CARE: NA    EQUIPMENT needed: As per hospice      DISCHARGE MEDICATIONS:  Current Facility-Administered Medications   Medication Dose Route Frequency Provider Last Rate Last Admin    ascorbic acid  (VITAMIN C ) tablet 500 mg  500 mg Oral Daily Eniola, Razaak A, MD   500 mg at 06/06/24 9072    donepezil  (ARICEPT ) tablet 10 mg  10 mg Oral Daily before dinner Eniola, Razaak A, MD   10 mg at 06/05/24 1534    DULoxetine  (CYMBALTA ) extended release capsule 30  mg  30 mg Oral Daily before dinner Eniola, Razaak A, MD   30 mg at 06/05/24 1534    pregabalin  (LYRICA ) capsule 200 mg  200 mg Oral BID Eniola, Razaak A, MD   200 mg at 06/06/24 9072    rOPINIRole  (REQUIP ) tablet 0.25 mg  0.25 mg Oral Nightly Eniola, Razaak A, MD   0.25 mg at 06/05/24 1957    valsartan  (DIOVAN ) tablet 160 mg  160 mg Oral Daily Eniola, Razaak A, MD   160 mg at 06/06/24 9072    vitamin E  capsule 1,000 Units  1,000 Units Oral Daily Eniola, Razaak A, MD   1,000 Units at 06/06/24 9072    sodium chloride  flush 0.9 % injection 5-40 mL  5-40 mL IntraVENous 2 times per day Eniola, Razaak A, MD   10 mL at 06/06/24 0926    sodium chloride  flush 0.9 % injection 5-40 mL  5-40 mL IntraVENous PRN Eniola, Razaak A, MD        0.9 % sodium chloride  infusion   IntraVENous PRN Eniola, Razaak A, MD        potassium chloride  (KLOR-CON ) extended release tablet 40 mEq  40 mEq Oral PRN Eniola, Razaak A, MD        Or    potassium bicarb-citric acid  (EFFER-K) effervescent tablet 40 mEq  40 mEq Oral PRN Eniola, Razaak A, MD        Or    potassium chloride  10 mEq/100 mL IVPB (Peripheral Line)  10 mEq IntraVENous PRN Eniola, Razaak A, MD        magnesium  sulfate 2000 mg in 50 mL IVPB premix  2,000 mg IntraVENous PRN Eniola, Razaak A, MD        enoxaparin  (LOVENOX ) injection 40 mg  40 mg SubCUTAneous Daily Eniola, Razaak A, MD   40 mg at 06/06/24 9071    ondansetron  (ZOFRAN -ODT) disintegrating tablet 4 mg  4 mg Oral Q8H PRN Eniola, Razaak A, MD        Or    ondansetron  (ZOFRAN ) injection 4 mg  4 mg IntraVENous Q6H PRN Eniola, Razaak A,  MD        polyethylene glycol (GLYCOLAX ) packet 17 g  17 g Oral Daily PRN Eniola, Razaak A, MD        acetaminophen  (TYLENOL ) tablet 650 mg  650 mg Oral Q6H PRN Anders, Razaak A, MD   650 mg at 06/06/24 9074    Or    acetaminophen  (TYLENOL ) suppository 650 mg  650 mg Rectal Q6H PRN Eniola, Razaak A, MD        oxyCODONE  (ROXICODONE ) immediate release tablet 2.5 mg  2.5 mg Oral Q6H PRN Eniola, Razaak  A, MD        morphine  (PF) injection 1 mg  1 mg IntraVENous Q4H PRN Eniola, Razaak A, MD        ipratropium 0.5 mg-albuterol  2.5 mg (DUONEB ) nebulizer solution 1 Dose  1 Dose Inhalation Q4H PRN Eniola, Razaak A, MD   1 Dose at 06/05/24 1414    ipratropium 0.5 mg-albuterol  2.5 mg (DUONEB ) nebulizer solution 1 Dose  1 Dose Inhalation 4x Daily RT [name], Salman F, MD   1 Dose at 06/06/24 1212      Current Discharge Medication List        Current Discharge Medication List        CONTINUE these medications which have NOT CHANGED    Details   acetaminophen -codeine  (TYLENOL  #3) 300-30 MG per tablet Take 1 tablet by mouth every 6 hours as needed for Pain.      fluticasone (FLONASE) 50 MCG/ACT nasal spray 1 spray by Nasal route daily as needed for Rhinitis or Allergies      oxyCODONE -acetaminophen  (PERCOCET) 5-325 MG per tablet Take 1 tablet by mouth every 8 hours as needed for Pain for up to 14 days. Intended supply: 3 days. Take lowest dose possible to manage pain Max Daily Amount: 3 tablets  Qty: 30 tablet, Refills: 0    Comments: Reduce doses taken as pain becomes manageable  Associated Diagnoses: Cervical radiculopathy due to degenerative joint disease of spine; Lumbosacral radiculopathy due to degenerative joint disease of spine      pregabalin  (LYRICA ) 200 MG capsule TAKE 1 CAPSULE BY MOUTH TWICE DAILY. 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 DULoxetine  (CYMBALTA ) 60 MG extended release capsule Take 1 capsule by mouth daily  Qty: 90 capsule, Refills: 1    Associated Diagnoses: Idiopathic small fiber peripheral neuropathy; Immune-mediated neuropathy      albuterol  (PROVENTIL ) (  2.5 MG/3ML) 0.083% nebulizer solution Take 3 mLs by nebulization 4 times daily as needed for Wheezing  Qty: 120 each, Refills: 3    Associated Diagnoses: Wheezes; Moderate persistent reactive airway disease without complication      cetirizine  (ZYRTEC ) 5 MG tablet Take 1 tablet by mouth daily  Qty: 90 tablet, Refills: 2    Associated Diagnoses: Seasonal allergic rhinitis due to pollen      rOPINIRole  (REQUIP ) 0.25 MG tablet Take 1 tablet by mouth nightly  Qty: 90 tablet, Refills: 1    Associated Diagnoses: Restless leg      Zinc 30 MG CAPS Take by mouth      Omega-3 Fatty Acids (FISH OIL) 1000 MG CPDR Take 3 capsules by mouth      chlorhexidine (PERIDEX) 0.12 % solution Swish and spit 15 mLs 2 times daily      vitamin E  1000 units capsule Take 1 capsule by mouth daily      Cholecalciferol (VITAMIN D3) 50 MCG (2000 UT) CAPS Take 1 capsule by mouth daily      ammonium lactate (LAC-HYDRIN) 12 % lotion Apply 1 Application topically as needed in the morning and 1 Application as needed in the evening.      ascorbic acid  (VITAMIN C ) 100 MG tablet Take 1 tablet by mouth daily      acetaminophen  (TYLENOL ) 500 MG tablet Take by mouth every 6 hours as needed for Pain       !! - Potential duplicate medications found. Please discuss with provider.              NOTIFY YOUR PHYSICIAN FOR ANY OF THE FOLLOWING:   Fever over 101 degrees for 24 hours.   Chest pain, shortness of breath, fever, chills, nausea, vomiting, diarrhea, change in mentation, falling, weakness, bleeding. Severe pain or pain not relieved by medications.  Or, any other signs or symptoms that you may have questions about.    DISPOSITION:    Home With:   OT  PT  HH  RN       Long term SNF/Inpatient Rehab    Independent/assisted living    Hospice    Other:       PATIENT CONDITION AT DISCHARGE:     Functional status    Poor     Deconditioned     Independent      Cognition     Lucid     Forgetful     Dementia      Catheters/lines (plus indication)    Foley     PICC     PEG     None      Code status     Full code     DNR      PHYSICAL EXAMINATION AT DISCHARGE:    General : alert x 3, awake, no acute distress,   HEENT: PEERL, EOMI, moist mucus membrane, TM clear  Neck: supple, no JVD, no meningeal signs  Chest: Clear to auscultation bilaterally   CVS: S1 S2 heard, Capillary refill less than 2 seconds  Abd: soft/ Non tender, non distended, BS physiological,   Ext: no clubbing, no cyanosis, no edema, brisk 2+ DP pulses  Neuro/Psych: pleasant mood and affect, CN 2-12 grossly intact, sensory grossly within normal limit, Strength 5/5 in all extremities, DTR 1+ x 4  Skin: warm     CHRONIC MEDICAL DIAGNOSES:      Greater than 31 minutes were spent with the patient on counseling and coordination  of care    Signed:   Kathrine Pac, MD  06/06/2024  2:26 PM     "

## 2024-06-06 NOTE — Plan of Care (Signed)
"    Problem: Safety - Adult  Goal: Free from fall injury  06/06/2024 2308 by Kendal Modest, RN  Outcome: Progressing  06/06/2024 0953 by Donnice Orlene BRAVO, RN  Outcome: Progressing     Problem: Chronic Conditions and Co-morbidities  Goal: Patient's chronic conditions and co-morbidity symptoms are monitored and maintained or improved  Outcome: Progressing  Flowsheets (Taken 06/06/2024 1945)  Care Plan - Patient's Chronic Conditions and Co-Morbidity Symptoms are Monitored and Maintained or Improved:   Monitor and assess patient's chronic conditions and comorbid symptoms for stability, deterioration, or improvement   Collaborate with multidisciplinary team to address chronic and comorbid conditions and prevent exacerbation or deterioration     Problem: Discharge Planning  Goal: Discharge to home or other facility with appropriate resources  Outcome: Progressing  Flowsheets (Taken 06/06/2024 1945)  Discharge to home or other facility with appropriate resources:   Identify barriers to discharge with patient and caregiver   Arrange for needed discharge resources and transportation as appropriate   Identify discharge learning needs (meds, wound care, etc)     Problem: ABCDS Injury Assessment  Goal: Absence of physical injury  Outcome: Progressing     Problem: Skin/Tissue Integrity  Goal: Skin integrity remains intact  Description: 1.  Monitor for areas of redness and/or skin breakdown  2.  Assess vascular access sites hourly  3.  Every 4-6 hours minimum:  Change oxygen saturation probe site  4.  Every 4-6 hours:  If on nasal continuous positive airway pressure, respiratory therapy assess nares and determine need for appliance change or resting period  06/06/2024 2308 by Kendal Modest, RN  Outcome: Progressing  Flowsheets (Taken 06/06/2024 1945)  Skin Integrity Remains Intact:   Monitor for areas of redness and/or skin breakdown   Assess vascular access sites hourly  06/06/2024 0953 by Donnice Orlene BRAVO, RN  Outcome:  Progressing  Flowsheets (Taken 06/06/2024 0800)  Skin Integrity Remains Intact:   Monitor for areas of redness and/or skin breakdown   Pressure redistribution bed/mattress (bed type)   Check visual cues for pain     Problem: Pain  Goal: Verbalizes/displays adequate comfort level or baseline comfort level  Outcome: Progressing  Flowsheets (Taken 06/06/2024 0925 by Donnice Orlene BRAVO, RN)  Verbalizes/displays adequate comfort level or baseline comfort level:   Encourage patient to monitor pain and request assistance   Assess pain using appropriate pain scale     "

## 2024-06-06 NOTE — Plan of Care (Signed)
"    Problem: Safety - Adult  Goal: Free from fall injury  Outcome: Progressing     Problem: Skin/Tissue Integrity  Goal: Skin integrity remains intact  Description: 1.  Monitor for areas of redness and/or skin breakdown  2.  Assess vascular access sites hourly  3.  Every 4-6 hours minimum:  Change oxygen saturation probe site  4.  Every 4-6 hours:  If on nasal continuous positive airway pressure, respiratory therapy assess nares and determine need for appliance change or resting period  Outcome: Progressing  Flowsheets (Taken 06/06/2024 0800)  Skin Integrity Remains Intact:   Monitor for areas of redness and/or skin breakdown   Pressure redistribution bed/mattress (bed type)   Check visual cues for pain     Problem: Pain  Goal: Verbalizes/displays adequate comfort level or baseline comfort level  Recent Flowsheet Documentation  Taken 06/06/2024 0925 by Donnice Orlene BRAVO, RN  Verbalizes/displays adequate comfort level or baseline comfort level:   Encourage patient to monitor pain and request assistance   Assess pain using appropriate pain scale     "

## 2024-06-06 NOTE — Discharge Instr - Other Orders (Addendum)
 "    Echo (TTE) complete (PRN contrast/bubble/strain/3D)    [name], Kimberly [name]   Patient MRN: [id number]   Patient DOB: August 16, 1923 (89 y.o.)   Legal Sex: Female   Height: 1.6 m (5' 2.99)   Weight: 64 kg (141 lb 1.5 oz)   BSA: 1.69 m   Blood Pressure: 128/76    Accession Number: [id number]   Date of Study: 09/13/2022 (12:31 PM)   Ordering Provider: Anders Thersia LABOR, MD   Clinical Indications: CVA       Reading Physicians  Performing Staff   Cardiology: Gardenia Marina, MD    Tech/Nurse: Udell Bolus        Reason for Exam  Priority: Routine  CVA   Dx: Acute CVA (cerebrovascular accident) (HCC) [I63.9 (ICD-10-CM)]     PACS Images     Show images for Echo (TTE) complete (PRN contrast/bubble/strain/3D)  Interpretation Summary  Show Result Comparison     Left Ventricle: Normal left ventricular systolic function with a visually estimated EF of 55 - 60%. Left ventricle size is normal. Normal wall thickness. Normal wall motion.    Image quality is fair.     Echo Findings    Left Ventricle Normal left ventricular systolic function with a visually estimated EF of 55 - 60%. Left ventricle size is normal. Normal wall thickness. Normal wall motion.   Left Atrium Left atrium size is normal.   Right Ventricle Right ventricle size is normal. Normal systolic function.   Right Atrium Right atrium size is normal.   Aortic Valve Valve structure is normal. No regurgitation. No stenosis.   Mitral Valve Valve structure is normal. No regurgitation. No stenosis noted.   Tricuspid Valve Valve structure is normal. No regurgitation. No stenosis noted.   Pulmonic Valve Valve structure is normal. No regurgitation. No stenosis noted.   Aorta Normal sized aortic root and ascending aorta.   IVC/Hepatic Veins IVC diameter is less than or equal to 21 mm and decreases greater than 50% during inspiration; therefore the estimated right atrial pressure is normal (~3 mmHg). IVC size is normal.   Pericardium No pericardial effusion.     Study  Details    Image quality: fair. The view(s) performed were parasternal, apical and subcostal. Color flow Doppler was performed and pulse wave and/or continuous wave Doppler was performed. No contrast was given.     Volumes and Function (Range)     EDV EDV Index ESV ESV Index EF   LV Biplane 58 mL  (56 - 104)       35 mL/m2       33 mL  (19 - 49)       20 mL/m2       43 %  (55 - 100) Abnormal          LV A4C 55 mL       33 mL/m2       29 mL       17 mL/m2       48 %         LV A2C 57 mL       34 mL/m2       32 mL       19 mL/m2       44 %         LA Area-Length   36 mL       22 mL/m2  (16 - 34)          LA  A4C Area-Length   35 mL  (22 - 52)       21 mL/m2  (16 - 34)          LA A2C Area-Length   37 mL  (22 - 52)       22 mL/m2  (16 - 34)          LA A4C MOD   33 mL  (22 - 52)       20 ml/m2  (16 - 34)          LA A2C MOD   36 mL  (22 - 52)       22 ml/m2  (16 - 34)            Left Ventricle Measurements    Dimensions   LVIDd 3.7 cm  (Range: 3.9 - 5.3) Abnormal       3.5 cm  (Range: 3.9 - 5.3) Abnormal          LVIDs 2.2 cm      2.7 cm         IVSd 1 cm  (Range: 0.6 - 0.9) Abnormal          LVPWd 1 cm  (Range: 0.6 - 0.9) Abnormal          Body Surface Area 1.69 m2                Right Ventricle Measurements    Dimensions   RV Mid Dimension 1.9 cm         RV Longitudinal Dimension 4.9 cm         RVIDd 2.8 cm          Function   RV Free Wall Peak S' 21 cm/s              Atrial Measurements    Left Atrium   LA Diameter 2.5 cm         LA Size Index 1.5 cm/m2         LA/AO Root Ratio 0.83                 Mitral Valve Measurements    Stenosis   MV PHT 59.8 ms         MV Area by PHT 3.7 cm2                 Tricuspid Valve Measurements    Regurgitation   TR Max Velocity 2.69 m/s         TR Peak Gradient 29 mmHg                Aortic Valve Measurements    Stenosis   AV Mean Gradient 5 mmHg         AV Peak Gradient 9 mmHg         AV Peak Velocity 1.5 m/s         AV Velocity Ratio 0.8         AV VTI 20.1 cm         LVOT:AV VTI  Index 1.03         AV Mean Velocity 1.1 m/s         AV Area by VTI 3.3 cm2         AVA/BSA VTI 2 cm2/m2         AV Area by Peak Velocity 2.6 cm2         AVA/BSA Peak Velocity 1.6 cm2/m2  LVOT   LVOT Diameter 2 cm         LVOT Area 3.1 cm2         LVOT Mean Gradient 4 mmHg         LVOT Peak Gradient 6 mmHg         LVOT VTI 20.8 cm         LVOT Peak Velocity 1.2 m/s                Pulmonary Measurements    Stenosis   PV Max Velocity 1.2 m/s         PV Peak Gradient 5 mmHg                  Aorta Measurements    Dimensions   Measurement Value (Range)   Aortic Root 3 cm         Ao Root Index 1.8 cm/m2         Ascending Aorta 3.2 cm         Ascending Aorta Index 1.92 cm/m2               Diastolic Filling/Shunts    Diastolic Filling   MV E Velocity 0.49 m/s         MV A Velocity 0.89 m/s         MV E/A 0.55         E/E' Ratio (Averaged) 7.58         LV E' Lateral Velocity 7 cm/s         E/E' Lateral 7         LV E' Septal Velocity 6 cm/s         MV E Wave Deceleration Time 206.2 ms          Shunt   LVOT Stroke Volume Index 39.1 mL/m2         LVOT SV 65.3 ml              All Reviewers List    Anders Thersia LABOR, MD on 09/15/2022 21:04     Signed    Electronically signed by Gardenia Marina, MD on 09/13/22 at 1408 EST       CTA CHEST W WO CONTRAST [[id number]]  Status: Final result     PACS Images     Show images for CTA CHEST W WO CONTRAST  CTA CHEST W WO CONTRAST  Order: [id number]   Status: Final result       Next appt: 07/27/2024 at 02:00 PM in [facility] Inetta Bumps, MD)    Test Result Released: Yes (not seen)    0 Result Notes  Details    Reading Physician Reading Date Result Priority   Kathey Stanford, MD  [phone] 06/04/2024      Narrative & Impression  EXAM:  CTA CHEST W WO CONTRAST     INDICATION: SOB with minimal exertion and recent hospitalization     COMPARISON: 3 weeks prior     TECHNIQUE: Helical thin section chest CT following intravenous administration of  iodinated intravenous contrast  according to departmental PE protocol. Coronal  and sagittal reformats were performed. 3D post processing was performed.  CT  dose reduction was achieved through the use of a standardized protocol tailored  for this examination and automatic exposure control for dose modulation.     FINDINGS: This is a good quality study for the evaluation of pulmonary embolism  to the first subsegmental arterial level. There  is no pulmonary embolism to this  level. Pulmonic trunk within normal limits.        MEDIASTINUM: Calcified lymph nodes.  HILA: Calcified lymph nodes.  THORACIC AORTA: No aneurysm.  HEART: Normal in size. Coronary artery calcifications are present.  ESOPHAGUS: No wall thickening or dilatation.  TRACHEA/BRONCHI: Patent.  PLEURA: No effusion or pneumothorax.  LUNGS: Peripheral scarring in the left lung. Dependent atelectasis.  UPPER ABDOMEN: Partially visualized low attenuating bilateral renal lesion with  visualized portions most consistent with cyst. Stable hepatic hypodensities.  BONES: Redemonstration of remote thoracic compression deformities. Multilevel  degenerative changes of the spine.        IMPRESSION:  No CT evidence of acute pulmonary embolism within limits of examination.              Electronically signed by Elia Mcalpine        Exam Ended: 06/04/24 19:40 EDT Last Resulted: 06/04/24 20:50 EDT       Labs      Latest Reference Range & Units Most Recent   Sodium 136 - 145 mmol/L 138  06/06/24 00:35   Potassium 3.5 - 5.1 mmol/L 4.8  06/06/24 00:35   Chloride 98 - 107 mmol/L 106  06/06/24 00:35   CARBON DIOXIDE 20 - 29 mmol/L 23  06/06/24 00:35   BUN,BUNPL 8 - 23 MG/DL 25 (H)  88/8/74 99:64   Creatinine 0.60 - 1.00 MG/DL 9.30  88/8/74 99:64   Bun/Cre 12 - 20   36 (H)  06/06/24 00:35   Anion Gap 2 - 14 mmol/L 9  06/06/24 00:35   Est, Glom Filt Rate >59 ml/min/1.88m2 79  06/06/24 00:35   GFR African American >60 ml/min/1.59m2 >60  07/12/20 04:02   Folate 4.8 - 24.2 ng/mL 12.0  06/05/24 03:42   Magnesium  1.7 -  2.3 mg/dL 2.5 (H)  89/68/74 96:57   Glucose 65 - 100 mg/dL 98  88/8/74 99:64   POC Glucose 65 - 117 mg/dL 884  89/68/74 86:42   Calcium  8.2 - 9.6 MG/DL 8.8  88/8/74 99:64   Albumin/Globulin Ratio 1.1 - 2.2   0.8 (L)  06/06/24 00:35   Phosphorus 2.4 - 4.5 MG/DL 3.5  89/68/74 96:57   Total Protein 6.4 - 8.3 g/dL 5.8 (L)  88/8/74 99:64   Procalcitonin ng/mL 0.08  05/16/24 10:56   POC Sodium 136 - 145 MMOL/L 145  03/25/23 15:42   POC Potassium 3.5 - 5.5 MMOL/L 4.3  03/25/23 15:42   POC Chloride 100 - 108 MMOL/L 107  03/25/23 15:42   POC Creatinine 0.6 - 1.3 MG/DL 0.8  1/80/75 84:57   POC Ionized Calcium  1.12 - 1.32 mmol/L 1.33 (H)  03/25/23 15:42   POC Lactic Acid 0.40 - 2.00 mmol/L 1.11  03/25/23 15:42   POC TCO2 19 - 24 MMOL/L 26 (H)  03/25/23 15:42   Vitamin B-12 232 - 1245 pg/mL 855  06/05/24 03:42   Globulin, Total 1.5 - 4.5 g/dL 2.8  88/88/80 83:50   HOMOCYSTEINE, PLASMA 3.7 - 13.9 umol/L 7.4  07/12/20 04:02   BNP <450 PG/ML 230  07/02/21 11:39   Total CK 24 - 173 U/L 138  09/04/18 13:07   CRP 0 - 0.5 mg/dL 3.7 (H)  89/68/74 96:57   CRP High Sensitivity mg/L 7.3  07/11/20 08:29   Troponin T 0 - 14 ng/L 11.0  06/05/24 03:42   Troponin, High Sensitivity 0 - 51 ng/L 11  02/21/24 18:23   Chol/HDL Ratio 0.0 - 5.0  2.5  09/13/22 05:45   Cholesterol, Total <200 MG/DL 865  02/03/74 94:54   HDL Cholesterol MG/DL 54  02/03/74 94:54   LDL Cholesterol 0 - 100 MG/DL 30.5  02/03/74 94:54   Triglycerides <150 MG/DL 53  02/03/74 94:54   VLDL MG/DL 89.3  02/03/74 94:54   NT Pro-BNP 0 - 450 PG/ML 99  06/04/24 16:15   Albumin 3.5 - 5.2 g/dL 2.6 (L)  88/8/74 99:64   Globulin 2.0 - 4.0 g/dL 3.2  88/8/74 99:64   Albumin/Globulin Ratio 1.1 - 2.2   0.5 (L)  07/02/21 11:39   Alkaline Phosphatase 35 - 104 U/L 85  06/06/24 00:35   ALT 10 - 35 U/L 20  06/06/24 00:35   AST 10 - 35 U/L 33  06/06/24 00:35   Total Bilirubin 0.0 - 1.2 MG/DL <9.7  88/8/74 99:64   Bilirubin, Direct 0.0 - 0.2 MG/DL <9.8  4/86/75 76:54   Gliadin Antibodies IgA 0 - 19 units 10  07/25/22 12:39    Gliadin Antibodies IgG 0 - 19 units 2  07/25/22 12:39   Lipase 13 - 75 U/L 355 (H)  12/17/22 22:40   Hemoglobin A1C 4.0 - 5.6 % 5.3  09/13/22 05:45   eAG (mg/dL) mg/dL 894  02/03/74 94:54   (H): Data is abnormally high  (L): Data is abnormally low  "

## 2024-06-07 MED ORDER — MULTI-VITAMIN/MINERALS PO TABS
ORAL_TABLET | Freq: Every day | ORAL | 3 refills | 30.00000 days | Status: AC
Start: 2024-06-07 — End: ?

## 2024-06-07 MED ORDER — FERROUS SULFATE 220 (44 FE) MG/5ML PO SOLN
220 | Freq: Every day | ORAL | 0 refills | 30.00000 days | Status: AC
Start: 2024-06-07 — End: 2025-06-07

## 2024-06-07 MED ORDER — FOLIC ACID 1 MG PO TABS
1 | ORAL_TABLET | Freq: Every day | ORAL | 1 refills | 90.00000 days | Status: AC
Start: 2024-06-07 — End: ?

## 2024-06-07 MED FILL — PREGABALIN 100 MG PO CAPS: 100 mg | ORAL | Qty: 2 | Fill #0

## 2024-06-07 MED FILL — ENOXAPARIN SODIUM 40 MG/0.4ML IJ SOSY: 40 MG/0.4ML | INTRAMUSCULAR | Qty: 0.4 | Fill #0

## 2024-06-07 MED FILL — ACETAMINOPHEN 325 MG PO TABS: 325 mg | ORAL | Qty: 2 | Fill #0

## 2024-06-07 MED FILL — VITAMIN E 450 MG (1000 UT) PO CAPS: 450 MG (1000 UT) | ORAL | Qty: 1 | Fill #0

## 2024-06-07 MED FILL — ROPINIROLE HCL 0.25 MG PO TABS: 0.25 mg | ORAL | Qty: 1 | Fill #0

## 2024-06-07 MED FILL — MORPHINE SULFATE 2 MG/ML IJ SOLN: 2 mg/mL | INTRAMUSCULAR | Qty: 1 | Fill #0

## 2024-06-07 MED FILL — VALSARTAN 160 MG PO TABS: 160 mg | ORAL | Qty: 1 | Fill #0

## 2024-06-07 MED FILL — ASCORBIC ACID 500 MG PO TABS: 500 mg | ORAL | Qty: 1 | Fill #0

## 2024-06-07 MED FILL — OXYCODONE HCL 5 MG PO TABS: 5 mg | ORAL | Qty: 1 | Fill #0

## 2024-06-07 MED FILL — IPRATROPIUM-ALBUTEROL 0.5-2.5 (3) MG/3ML IN SOLN: 0.5-2.5 (3) MG/3ML | RESPIRATORY_TRACT | Qty: 3 | Fill #0

## 2024-06-07 NOTE — Care Coordination (Addendum)
"  Transition of Care:    Discharge home with family and [facility] (accepted).    CM reviewed 2nd IMM letter with patient, copy provided (family transport and/or CM able to assist as needed).     --  Patient lives at home and Angeline has been her 24 hour caregiver. This pt was open to [facility] until July 2025. She is open to [facility] currently. This pt has a rollator, BSC, overbead tray, transfer chair, WC, walk in shower with a seat; has a concentrator for PRN oxygen; has a great deal of support from her church family as well as family.    Transition of Care Plan:    RUR:  20%  Prior Level of Functioning: home with HH  Disposition: home with [facility]  EDD:  11/2   If SNF or IPR: Date Freedom of Choice offered: 10/31  Date Freedom of Choice received: 10/31  Accepting facility:   Date authorization started with reference number:   Date authorization received and expires:   Follow up appointments: defer to AVS  DME needed: likely none  Transportation at discharge:   Ambulance transport?  Pt and family notified of potential financial responsibility for transport?          Who?  IM/IMM Medicare/Tricare letter given:   Is patient a Veteran and connected with VA?    If yes, was [employer] transfer form completed and VA notified?   Caregiver Contact: niece Angeline [phone]   Discharge Caregiver contacted prior to discharge? y  Care Conference needed?   Barriers to discharge: nne    "

## 2024-06-07 NOTE — Progress Notes (Signed)
"  Spiritual Health Palliative Care Note        Room # 350/01    Name: Kimberly [name]           Age: 89 y.o.    Gender: female          MRN: [id number]  Religion: Catholic       Preferred Language: English      Date: 06/07/24  Visit Time: Begin Time: 1000 End Time : 1020 Complexity of Encounter: Moderate      Visit Summary: Chaplain responded to referral from the palliative care team. Offered presence. Patient is looking forward to returning home. No immediate spiritual health needs.     Referral/Consult From: Palliative Care   Encounter Overview/Reason: Palliative Care  Encounter Code: Encounter Code: Q9001 Assessment by chaplain services   Crisis (if applicable):    Service Provided For: Patient     Patient was available.   <IF NOT AVAILABLE>  Patient:       Faith, Belief, Meaning:   Patient identifies as spiritual  is connected with a faith tradition or spiritual practice  has beliefs or practices that help with coping during difficult times  Family/Friends No family/friends present    Importance and Influence:  Patient unable to assess at this time  Family/Friends No family/friends present    Community:  Patient   No family/ friends present.  Family/Friends   No family/ friends present.    Assessment and Plan of Care:   Emotions Expressed by Patient:   No family/ friends present when chaplain visited.    Interventions by Chaplain:   No family present    Result/ Response by Patient:   engaged in conversation and expressed gratitude    Patient Plan of Care:   Spiritual care available upon referral.    Emotions Expressed by Spouse/Family/Friends:   No family/ friends present when chaplain visited.    Chaplain Interventions with Spouse/ Family/Friends include:   No family present    Spouse/Family/Friends Plan of Care:   Spiritual care available upon referral.      Code Status:   Patient indicated they have/have not discussed code status with their doctor.   Patient stated their code status is:  DNR  Full Code  Partial  Code    HCPOA:  Patient does/ does not have an Advance Directive on file.     Designated Decision Maker:    Primary Decision Maker: Morna Laurel Search - Niece/Nephew - [phone]    Primary Decision Maker: Emery Handing - Niece/Nephew - [phone]    Secondary Decision Maker: Leavy Ramp - [phone]        Electronically signed by   Ubaldo Louder, Chaplain, MDiv, MS, BCC  "

## 2024-06-07 NOTE — Discharge Summary (Signed)
 "                                                                                                                         Discharge Summary       PATIENT ID: Kimberly [name]  MRN: [id number]   DATE OF BIRTH: [birth year]    DATE OF ADMISSION: 06/04/2024  3:54 PM    DATE OF DISCHARGE: 06-07-24  PRIMARY CARE PROVIDER: Kennie Darin MATSU, MD     ATTENDING PHYSICIAN: Kathrine Pac  DISCHARGING PROVIDER: Kathrine Pac, MD    To contact this individual call [phone] and ask the operator to page.  If unavailable ask to be transferred the Adult Hospitalist Department.    CONSULTATIONS: IP CONSULT TO PALLIATIVE CARE  IP CONSULT TO HOSPICE  IP CONSULT TO CASE MANAGEMENT    PROCEDURES/SURGERIES: * No surgery found *    ADMITTING DIAGNOSES & HOSPITAL COURSE:   06-04-24 Kimberly [name] is a 89 y.o. female with past medical history significant for left breast cancer with resultant left lumpectomy, neuropathy, hypertension, dementia presented to the emergency room with shortness of breath. Patient has chronic respiratory failure and on 2 L of oxygen at home. There was no fever, rigors or chills reported. Patient lives at home and cared for by her nieces. Patient is not able to provide history because of underlying dementia and advanced age. The patient shortness of breath is worse with mild exertion even when the patient is moving in bed and has increased work of breathing with activity       10-31-25SOB resting comfortably in bed on 2.5 L of NC.     06-06-24 Patient hard of hearing.  Comfortable while resting in bed.  Admitted for acute on chronic respiratory failure with hypoxia.  Temperature 98 degrees, respiratory rate 27/min, heart rate 89/min, blood pressure 165 x 82.  Oxygen saturation 92% on room air.  Today's labs potassium 4.8.  CBC shows a hemoglobin of 9.7 with a hematocrit of 30.8.  Baseline 1 month ago was 11.7/38.2.Continues to have elevated neutrophil percentage.  POC glucose testing 115.  B12 855.  Folate 12.0.   TSH 0.634 troponin 11.0.  CTA from October 30 shows calcified lymph nodes in the mediastinum.  Also present in the hilar area.  No PE.  Chest x-ray grossly clear lung fields.  Chronic compression fracture at T9, T10.  Subpleural fibrotic changes left upper lobe.        06-07-24 Patient is hard of hearing.  In the bed.  No distress.  Family does not want hospice at this time.  Home health on board for continued care.  Patient is a residence.  Discussed with nursing.  Patient ate her dinner without significant assistance.  Currently saturating normal on home oxygen.  Vitals today show temperature 98.5, respiratory 16/min, heart rate 70/min, blood pressure 137 x 67..  Care coordination notes were reviewed.  Patient has pending future appointments with her family medicine physician.  Discussed  with nursing.  BUN 25 serum creatinine 0.69.  Total protein 5.8 albumin 2.6.  Patient has a steadily declining hemoglobin of 9.7 with hematocrit of 30.8.  Overall stable compared to October 14 numbers.  Microcytic hypochromic picture.  Poor candidate for any extensive, aggressive diagnostic or therapeutic interventions.    DISCHARGE DIAGNOSES / PLAN:       Acute on chronic respiratory failure , pulmonary fibrosis,    1.  Acute on chronic respiratory failure with hypoxia POA: Patient saturating well on 2.5 L which is what she uses at home. Her niece is  at bedside who tells me she gets SOB with exertion. CTA  showed Peripheral scarring in the left lung. Dependent atelectasis.  Incentive spirometry ordered. Patient is meeting with Hospice at this time. POA is Rock she will make decision today about disposition.  2.  Dementia : Will continue Aricept  and supportive therapy.     3.  Neuropathy : Will resume preadmission medications  4.  Essential hypertension POA: Will resume preadmission medication and monitor blood pressure closely  5.  Restless leg syndrome POA: Will continue Requip   6. Anemia: Stable , Iron and Multivitamin  supplements  7. Compression fracture D9, D10: As needed Tylenol   8. NSVTach: Palliative and comfort ca    PENDING TEST RESULTS:   At the time of discharge the following test results are still pending: NA    FOLLOW UP APPOINTMENTS:    @DCFOLLOWUP @     ADDITIONAL CARE RECOMMENDATIONS: Hospice to follow up at home for comfort measures    DIET: regular diet  Oral Nutritional Supplements: NA    ACTIVITY: activity as tolerated    WOUND CARE: NA    EQUIPMENT needed: As per hospice      DISCHARGE MEDICATIONS:  Current Facility-Administered Medications   Medication Dose Route Frequency Provider Last Rate Last Admin    ascorbic acid  (VITAMIN C ) tablet 500 mg  500 mg Oral Daily Eniola, Razaak A, MD   500 mg at 06/06/24 9072    donepezil  (ARICEPT ) tablet 10 mg  10 mg Oral Daily before dinner Eniola, Razaak A, MD   10 mg at 06/06/24 1550    DULoxetine  (CYMBALTA ) extended release capsule 30 mg  30 mg Oral Daily before dinner Eniola, Razaak A, MD   30 mg at 06/06/24 1550    pregabalin  (LYRICA ) capsule 200 mg  200 mg Oral BID Eniola, Razaak A, MD   200 mg at 06/06/24 2007    rOPINIRole  (REQUIP ) tablet 0.25 mg  0.25 mg Oral Nightly Eniola, Razaak A, MD   0.25 mg at 06/06/24 2008    valsartan  (DIOVAN ) tablet 160 mg  160 mg Oral Daily Eniola, Razaak A, MD   160 mg at 06/06/24 9072    vitamin E  capsule 1,000 Units  1,000 Units Oral Daily Eniola, Razaak A, MD   1,000 Units at 06/06/24 9072    sodium chloride  flush 0.9 % injection 5-40 mL  5-40 mL IntraVENous 2 times per day Eniola, Razaak A, MD   10 mL at 06/06/24 2007    sodium chloride  flush 0.9 % injection 5-40 mL  5-40 mL IntraVENous PRN Eniola, Razaak A, MD        0.9 % sodium chloride  infusion   IntraVENous PRN Eniola, Razaak A, MD        potassium chloride  (KLOR-CON ) extended release tablet 40 mEq  40 mEq Oral PRN Anders Thersia LABOR, MD        Or  potassium bicarb-citric acid  (EFFER-K) effervescent tablet 40 mEq  40 mEq Oral PRN Eniola, Razaak A, MD        Or    potassium  chloride 10 mEq/100 mL IVPB (Peripheral Line)  10 mEq IntraVENous PRN Eniola, Razaak A, MD        magnesium  sulfate 2000 mg in 50 mL IVPB premix  2,000 mg IntraVENous PRN Eniola, Razaak A, MD        enoxaparin  (LOVENOX ) injection 40 mg  40 mg SubCUTAneous Daily Eniola, Razaak A, MD   40 mg at 06/06/24 9071    ondansetron  (ZOFRAN -ODT) disintegrating tablet 4 mg  4 mg Oral Q8H PRN Eniola, Razaak A, MD        Or    ondansetron  (ZOFRAN ) injection 4 mg  4 mg IntraVENous Q6H PRN Eniola, Razaak A, MD        polyethylene glycol (GLYCOLAX ) packet 17 g  17 g Oral Daily PRN Eniola, Razaak A, MD        acetaminophen  (TYLENOL ) tablet 650 mg  650 mg Oral Q6H PRN Anders, Razaak A, MD   650 mg at 06/06/24 9074    Or    acetaminophen  (TYLENOL ) suppository 650 mg  650 mg Rectal Q6H PRN Eniola, Razaak A, MD        oxyCODONE  (ROXICODONE ) immediate release tablet 2.5 mg  2.5 mg Oral Q6H PRN Eniola, Razaak A, MD        morphine  (PF) injection 1 mg  1 mg IntraVENous Q4H PRN Eniola, Razaak A, MD        ipratropium 0.5 mg-albuterol  2.5 mg (DUONEB ) nebulizer solution 1 Dose  1 Dose Inhalation Q4H PRN Eniola, Razaak A, MD   1 Dose at 06/05/24 1414    ipratropium 0.5 mg-albuterol  2.5 mg (DUONEB ) nebulizer solution 1 Dose  1 Dose Inhalation 4x Daily RT [name], Salman F, MD   1 Dose at 06/06/24 1910      Current Discharge Medication List        Current Discharge Medication List        CONTINUE these medications which have NOT CHANGED    Details   acetaminophen -codeine  (TYLENOL  #3) 300-30 MG per tablet Take 1 tablet by mouth every 6 hours as needed for Pain.      fluticasone (FLONASE) 50 MCG/ACT nasal spray 1 spray by Nasal route daily as needed for Rhinitis or Allergies      oxyCODONE -acetaminophen  (PERCOCET) 5-325 MG per tablet Take 1 tablet by mouth every 8 hours as needed for Pain for up to 14 days. Intended supply: 3 days. Take lowest dose possible to manage pain Max Daily Amount: 3 tablets  Qty: 30 tablet, Refills: 0    Comments: Reduce doses  taken as pain becomes manageable  Associated Diagnoses: Cervical radiculopathy due to degenerative joint disease of spine; Lumbosacral radiculopathy due to degenerative joint disease of spine      pregabalin  (LYRICA ) 200 MG capsule TAKE 1 CAPSULE BY MOUTH TWICE DAILY. 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 DULoxetine  (CYMBALTA ) 60 MG extended release capsule Take 1 capsule by mouth daily  Qty: 90 capsule, Refills: 1    Associated Diagnoses: Idiopathic small fiber peripheral neuropathy; Immune-mediated neuropathy      albuterol  (PROVENTIL ) (2.5 MG/3ML) 0.083% nebulizer solution Take 3 mLs by nebulization 4 times daily as needed for Wheezing  Qty: 120 each, Refills: 3    Associated Diagnoses: Wheezes; Moderate persistent reactive airway disease without complication      cetirizine  (ZYRTEC ) 5 MG tablet Take 1 tablet by mouth daily  Qty: 90 tablet, Refills: 2    Associated Diagnoses: Seasonal allergic  rhinitis due to pollen      rOPINIRole  (REQUIP ) 0.25 MG tablet Take 1 tablet by mouth nightly  Qty: 90 tablet, Refills: 1    Associated Diagnoses: Restless leg      Zinc 30 MG CAPS Take by mouth      Omega-3 Fatty Acids (FISH OIL) 1000 MG CPDR Take 3 capsules by mouth      chlorhexidine (PERIDEX) 0.12 % solution Swish and spit 15 mLs 2 times daily      vitamin E  1000 units capsule Take 1 capsule by mouth daily      Cholecalciferol (VITAMIN D3) 50 MCG (2000 UT) CAPS Take 1 capsule by mouth daily      ammonium lactate (LAC-HYDRIN) 12 % lotion Apply 1 Application topically as needed in the morning and 1 Application as needed in the evening.      ascorbic acid  (VITAMIN C ) 100 MG tablet Take 1 tablet by mouth daily       !! - Potential duplicate medications found. Please discuss with provider.             NOTIFY YOUR PHYSICIAN FOR ANY OF THE FOLLOWING:   Fever over 101 degrees for 24 hours.   Chest pain, shortness of breath, fever, chills, nausea, vomiting, diarrhea, change in mentation, falling, weakness, bleeding. Severe pain or pain not relieved by medications.  Or, any other signs or symptoms that you may have questions about.    DISPOSITION:    Home With:   OT  PT  HH  RN       Long term SNF/Inpatient Rehab    Independent/assisted living    Hospice    Other:       PATIENT CONDITION AT DISCHARGE:     Functional status    Poor     Deconditioned     Independent      Cognition     Lucid     Forgetful     Dementia      Catheters/lines (plus indication)    Foley     PICC     PEG     None      Code status     Full code     DNR      PHYSICAL EXAMINATION AT DISCHARGE:  General : alert x 3, awake, no acute distress,   HEENT: PEERL, EOMI, moist mucus membrane, TM clear  Neck: supple, no JVD, no meningeal signs  Chest: Clear to auscultation bilaterally   CVS: S1 S2 heard, Capillary refill less than 2 seconds  Abd: soft/ Non tender, non distended, BS physiological,   Ext: no clubbing, no cyanosis, no edema, brisk 2+ DP  pulses  Neuro/Psych: pleasant mood and affect, CN 2-12 grossly intact, sensory grossly within normal limit, Strength 5/5 in all extremities, DTR 1+ x 4  Skin: warm     CHRONIC MEDICAL DIAGNOSES:      Greater than 31 minutes were spent with the patient on counseling and coordination of care    Signed:   Kathrine Pac, MD  06/07/2024  7:46 AM     "

## 2024-06-07 NOTE — Progress Notes (Signed)
"    Physician Progress Note      PATIENTAZYIAH, Kimberly [name]  CSN #:                  [id number]  DOB:                       1923/11/15  ADMIT DATE:       06/04/2024 3:54 PM  DISCH DATE:        06/07/2024 3:55 PM  RESPONDING  PROVIDER #:        Kathrine Pac MD          QUERY TEXT:    Acute on chronic respiratory failure with hypoxia is documented in the medical   record in 10/30 H&P and 10/31 IM Progress Note. Please provide additional   clinical indicators supportive of this diagnosis or please document if the   diagnosis has been ruled out after study.    The clinical indicators include:  10/30 ED Progress Note by Dr. Lynnda is not in acute distress.,   Pulmonary effort is normal. No respiratory distress. ,  Dyspnea when   talking or moving around in bed    10/30 H&P by Dr. Merlin appears ill, in moderate distress., Few   expiratory wheezing, no crackles    10/31 IM Progress Note by Dr. Geary acute distress, CTA bilaterally. No   wheezing/rhonchi/rales. No accessory muscle use    10/30 VS 1501 RR 18, RA SpO2 96%    10/30 VS 1700 RR 21, RA SpO2 96%    10/31 VS 0246 RR 15, SpO2 97% om 2.5 liters O2    10/30 chest xray--Grossly clear lungs    10/30 chest CTA--No CT evidence of acute pulmonary embolism within limits of   examination        Treatment--Supplemental O2, chest xray, chest CTA, Duo-Nebs, IV Solu-Medrol   Options provided:  -- Acute Respiratory Failure with hypoxia, present on arrival, as evidenced   by, Please document evidence.  -- Acute Respiratory Failure ruled out after study and Chronic Respiratory   Failure with hypoxia confirmed  -- Other - I will add my own diagnosis  -- Disagree - Not applicable / Not valid  -- Refer to Clinical Documentation Reviewer    PROVIDER RESPONSE TEXT:    Patient on home oxygen and i believe on arrival to ed sats was low and oxygen   supplemented    Query created by: Judythe Best on 06/05/2024 3:02 PM      Electronically signed by:   Kathrine Pac MD 06/08/2024 4:59 PM          "

## 2024-06-07 NOTE — Progress Notes (Signed)
 "                                                       Hospitalist Progress Note                               Kathrine Pac, MD                                     Answering service: [phone]                               OR [phone] from in house phone                                         Date of Service:  06/07/2024  NAME:  Kimberly [name]  DOB:  [birth year]  MRN:  [id number]      Admission Summary:   Kimberly [name] is a 89 y.o. female with past medical history significant for left breast cancer with resultant left lumpectomy, neuropathy, hypertension, dementia presented to the emergency room with shortness of breath.  Patient has chronic respiratory failure and on 2 L of oxygen at home.  There was no fever, rigors or chills reported.  Patient lives at home and cared for by her nieces.  Patient is not able to provide history because of underlying dementia and advanced age.  The patient shortness of breath is worse with mild exertion even when the patient is moving in bed and has increased work of breathing with activity     Reason for follow up:   10-31-25SOB resting comfortably in bed on 2.5 L of NC.     06-06-24 Patient hard of hearing.  Comfortable while resting in bed.  Admitted for acute on chronic respiratory failure with hypoxia.  Temperature 98 degrees, respiratory rate 27/min, heart rate 89/min, blood pressure 165 x 82.  Oxygen saturation 92% on room air.  Today's labs potassium 4.8.  CBC shows a hemoglobin of 9.7 with a hematocrit of 30.8.  Baseline 1 month ago was 11.7/38.2.Continues to have elevated neutrophil percentage.  POC glucose testing 115.  B12 855.  Folate 12.0.  TSH 0.634 troponin 11.0.  CTA from October 30 shows calcified lymph nodes in the mediastinum.  Also present in the hilar area.  No PE.  Chest x-ray grossly clear lung fields.  Chronic compression fracture at T9, T10.  Subpleural fibrotic changes left upper lobe.    06-07-24 Patient is hard of hearing.  In the bed.  No distress.   Family does not want hospice at this time.  Home health on board for continued care.  Patient is a residence.  Discussed with nursing.  Patient ate her dinner without significant assistance.  Currently saturating normal on home oxygen.  Vitals today show temperature 98.5, respiratory 16/min, heart rate 70/min, blood pressure 137 x 67..  Care coordination notes were reviewed.  Patient has pending future appointments with her family medicine physician.  Discussed with nursing.  BUN 25 serum creatinine 0.69.  Total protein 5.8 albumin 2.6.  Patient has a steadily declining hemoglobin of 9.7 with hematocrit of 30.8.  Overall stable compared to October 14 numbers.  Microcytic hypochromic picture.  Poor candidate for any extensive, aggressive diagnostic or therapeutic interventions.      Assessment & Plan:     1.  Acute on chronic respiratory failure with hypoxia POA: Patient saturating well on 2.5 L which is what she uses at home. Her niece is  at bedside who tells me she gets SOB with exertion. CTA  showed Peripheral scarring in the left lung. Dependent atelectasis.  Incentive spirometry ordered. Patient is meeting with Hospice at this time. POA is [place] she will make decision today about disposition.  2.  Dementia : Will continue Aricept  and supportive therapy.     3.  Neuropathy : Will resume preadmission medications  4.  Essential hypertension POA: Will resume preadmission medication and monitor blood pressure closely  5.  Restless leg syndrome POA: Will continue Requip   6. Anemia: Stable , Iron and Multivitamin supplements  7. Compression fracture D9, D10: As needed Tylenol   8. NSVTach: Palliative and comfort care          Diet:Regular   Code status: DNR  DVT prophylaxis: Lovenox   Care Plan discussed with: Niece and patient   Patient has given Verbal permission to discuss medical care with   persons present in the room and and also with contact as listed on face sheet.   Discharge planning/disposition:HOME with home  health          Review of Systems:   Pertinent items are noted in HPI.     Physical Examination:      Last 24hrs VS reviewed since prior progress note. Most recent are:  Vitals:    06/07/24 0625   BP: 137/67   Pulse: 70   Resp: 16   Temp: 98.5 F (36.9 C)   SpO2:            Constitutional:  No acute distress, cooperative, pleasant    HEENT: Head is a traumatic,  Un icteric sclera.Pink conjunctiva,no erythema or discharge.Oral mucous moist, oropharynx benign. Neck supple,    Resp:  CTA bilaterally. No wheezing/rhonchi/rales. No accessory muscle use   CV:  Regular rhythm, normal rate, no murmurs, gallops, rubs    GI:  Soft, non distended, non tender. normoactive bowel sounds, no hepatosplenomegaly    GU:  No CVA or suprapubic tenderness   Skin  :  No erythema,rash,bullae,dipigmentation     Musculoskeletal:  No edema, warm, 2+ pulses throughout    Neurologic:  AAOx3, CN II-XII reviewed.Moves all extremities.     Skin:  Good turgor, no rashes or ulcers       Intake/Output Summary (Last 24 hours) at 06/07/2024 0745  Last data filed at 06/06/2024 1829  Gross per 24 hour   Intake 245 ml   Output 1350 ml   Net -1105 ml          Data Review:    Review and/or order of clinical lab test      Labs:     Recent Labs     06/05/24  0342 06/06/24  0035   WBC 5.9 9.3   HGB 11.2* 9.7*   HCT 34.3* 30.8*   PLT 275 290     Recent Labs     06/04/24  1615 06/05/24  0342 06/06/24  0035   NA 141 144 138   K 4.6 4.6 4.8  CL 105 109* 106   CO2 26 25 23    BUN 19 18 25*   MG  --  2.5*  --    PHOS  --  3.5  --      Recent Labs     06/04/24  1615 06/05/24  0342 06/06/24  0035   ALT 16 17 20    GLOB 4.5* 4.1* 3.2     No results for input(s): INR, APTT in the last 72 hours.    Invalid input(s): PTP   No results for input(s): TIBC in the last 72 hours.    Invalid input(s): FE, PSAT, FERR   No results found for: RBCF   No results for input(s): PH, PCO2, PO2 in the last 72 hours.  No results for input(s): CPK in the last 72  hours.    Invalid input(s): CPKMB, CKNDX, TROIQ  Lab Results   Component Value Date/Time    CHOL 134 09/13/2022 05:45 AM    HDL 54 09/13/2022 05:45 AM    LDL 69.4 09/13/2022 05:45 AM     No results found for: GLUCPOC  @LABUA @      Medications Reviewed:     Current Facility-Administered Medications   Medication Dose Route Frequency    ascorbic acid  (VITAMIN C ) tablet 500 mg  500 mg Oral Daily    donepezil  (ARICEPT ) tablet 10 mg  10 mg Oral Daily before dinner    DULoxetine  (CYMBALTA ) extended release capsule 30 mg  30 mg Oral Daily before dinner    pregabalin  (LYRICA ) capsule 200 mg  200 mg Oral BID    rOPINIRole  (REQUIP ) tablet 0.25 mg  0.25 mg Oral Nightly    valsartan  (DIOVAN ) tablet 160 mg  160 mg Oral Daily    vitamin E  capsule 1,000 Units  1,000 Units Oral Daily    sodium chloride  flush 0.9 % injection 5-40 mL  5-40 mL IntraVENous 2 times per day    sodium chloride  flush 0.9 % injection 5-40 mL  5-40 mL IntraVENous PRN    0.9 % sodium chloride  infusion   IntraVENous PRN    potassium chloride  (KLOR-CON ) extended release tablet 40 mEq  40 mEq Oral PRN    Or    potassium bicarb-citric acid  (EFFER-K) effervescent tablet 40 mEq  40 mEq Oral PRN    Or    potassium chloride  10 mEq/100 mL IVPB (Peripheral Line)  10 mEq IntraVENous PRN    magnesium  sulfate 2000 mg in 50 mL IVPB premix  2,000 mg IntraVENous PRN    enoxaparin  (LOVENOX ) injection 40 mg  40 mg SubCUTAneous Daily    ondansetron  (ZOFRAN -ODT) disintegrating tablet 4 mg  4 mg Oral Q8H PRN    Or    ondansetron  (ZOFRAN ) injection 4 mg  4 mg IntraVENous Q6H PRN    polyethylene glycol (GLYCOLAX ) packet 17 g  17 g Oral Daily PRN    acetaminophen  (TYLENOL ) tablet 650 mg  650 mg Oral Q6H PRN    Or    acetaminophen  (TYLENOL ) suppository 650 mg  650 mg Rectal Q6H PRN    oxyCODONE  (ROXICODONE ) immediate release tablet 2.5 mg  2.5 mg Oral Q6H PRN    morphine  (PF) injection 1 mg  1 mg IntraVENous Q4H PRN    ipratropium 0.5 mg-albuterol  2.5 mg (DUONEB ) nebulizer  solution 1 Dose  1 Dose Inhalation Q4H PRN    ipratropium 0.5 mg-albuterol  2.5 mg (DUONEB ) nebulizer solution 1 Dose  1 Dose Inhalation 4x Daily RT     ______________________________________________________________________  EXPECTED LENGTH OF STAY: 3  ACTUAL LENGTH OF STAY:          3                 Kathrine Pac, MD     "

## 2024-06-07 NOTE — Progress Notes (Signed)
 "                                                       Hospitalist Progress Note                               Kathrine Pac, MD                                     Answering service: [phone]                               OR [phone] from in house phone                                         Date of Service:  06/07/2024  NAME:  Kimberly [name]  DOB:  04-Jul-1924  MRN:  [id number]      Admission Summary:   VASHON ARCH is a 89 y.o. female with past medical history significant for left breast cancer with resultant left lumpectomy, neuropathy, hypertension, dementia presented to the emergency room with shortness of breath.  Patient has chronic respiratory failure and on 2 L of oxygen at home.  There was no fever, rigors or chills reported.  Patient lives at home and cared for by her nieces.  Patient is not able to provide history because of underlying dementia and advanced age.  The patient shortness of breath is worse with mild exertion even when the patient is moving in bed and has increased work of breathing with activity     Reason for follow up:   10-31-25SOB resting comfortably in bed on 2.5 L of NC.     06-06-24 Patient hard of hearing.  Comfortable while resting in bed.  Admitted for acute on chronic respiratory failure with hypoxia.  Temperature 98 degrees, respiratory rate 27/min, heart rate 89/min, blood pressure 165 x 82.  Oxygen saturation 92% on room air.  Today's labs potassium 4.8.  CBC shows a hemoglobin of 9.7 with a hematocrit of 30.8.  Baseline 1 month ago was 11.7/38.2.Continues to have elevated neutrophil percentage.  POC glucose testing 115.  B12 855.  Folate 12.0.  TSH 0.634 troponin 11.0.  CTA from October 30 shows calcified lymph nodes in the mediastinum.  Also present in the hilar area.  No PE.  Chest x-ray grossly clear lung fields.  Chronic compression fracture at T9, T10.  Subpleural fibrotic changes left upper lobe.    06-07-24 Patient is hard of hearing.  In the bed.  No distress.   Family does not want hospice at this time.  Home health on board for continued care.  Patient is a residence.  Discussed with nursing.  Patient ate her dinner without significant assistance.  Currently saturating normal on home oxygen.  Vitals today show temperature 98.5, respiratory 16/min, heart rate 70/min, blood pressure 137 x 67..  Care coordination notes were reviewed.  Patient has pending future appointments with her family medicine physician.  Discussed with nursing.  BUN 25 serum creatinine 0.69.  Total protein 5.8 albumin 2.6.  Patient has a steadily declining hemoglobin of 9.7 with hematocrit of 30.8.  Overall stable compared to October 14 numbers.  Microcytic hypochromic picture.  Poor candidate for any extensive, aggressive diagnostic or therapeutic interventions.  Assessment & Plan:     1.  Acute on chronic respiratory failure with hypoxia POA: Patient saturating well on 2.5 L which is what she uses at home. Her niece is  at bedside who tells me she gets SOB with exertion. CTA  showed Peripheral scarring in the left lung. Dependent atelectasis.  Incentive spirometry ordered. Patient is meeting with Hospice at this time. POA is [place] she will make decision today about disposition.  2.  Dementia : Will continue Aricept  and supportive therapy.     3.  Neuropathy : Will resume preadmission medications  4.  Essential hypertension POA: Will resume preadmission medication and monitor blood pressure closely  5.  Restless leg syndrome POA: Will continue Requip   6. Anemia: Stable , Iron and Multivitamin supplements  7. Compression fracture D9, D10: As needed Tylenol   8. NSVTach: Palliative and comfort care          Diet:Regular   Code status: DNR  DVT prophylaxis: Lovenox   Care Plan discussed with: Niece and patient   Patient has given Verbal permission to discuss medical care with   persons present in the room and and also with contact as listed on face sheet.   Discharge  planning/disposition:TBD          Review of Systems:   Pertinent items are noted in HPI.     Physical Examination:      Last 24hrs VS reviewed since prior progress note. Most recent are:  Vitals:    06/07/24 0547   BP:    Pulse: 92   Resp:    Temp:    SpO2:            Constitutional:  No acute distress, cooperative, pleasant    HEENT: Head is a traumatic,  Un icteric sclera.Pink conjunctiva,no erythema or discharge.Oral mucous moist, oropharynx benign. Neck supple,    Resp:  CTA bilaterally. No wheezing/rhonchi/rales. No accessory muscle use   CV:  Regular rhythm, normal rate, no murmurs, gallops, rubs    GI:  Soft, non distended, non tender. normoactive bowel sounds, no hepatosplenomegaly    GU:  No CVA or suprapubic tenderness   Skin  :  No erythema,rash,bullae,dipigmentation     Musculoskeletal:  No edema, warm, 2+ pulses throughout    Neurologic:  AAOx3, CN II-XII reviewed.Moves all extremities.     Skin:  Good turgor, no rashes or ulcers       Intake/Output Summary (Last 24 hours) at 06/07/2024 0708  Last data filed at 06/06/2024 1829  Gross per 24 hour   Intake 245 ml   Output 1350 ml   Net -1105 ml          Data Review:    Review and/or order of clinical lab test      Labs:     Recent Labs     06/05/24  0342 06/06/24  0035   WBC 5.9 9.3   HGB 11.2* 9.7*   HCT 34.3* 30.8*   PLT 275 290     Recent Labs     06/04/24  1615 06/05/24  0342 06/06/24  0035   NA 141 144 138   K 4.6 4.6 4.8   CL 105 109* 106   CO2 26 25  23   BUN 19 18 25*   MG  --  2.5*  --    PHOS  --  3.5  --      Recent Labs     06/04/24  1615 06/05/24  0342 06/06/24  0035   ALT 16 17 20    GLOB 4.5* 4.1* 3.2     No results for input(s): INR, APTT in the last 72 hours.    Invalid input(s): PTP   No results for input(s): TIBC in the last 72 hours.    Invalid input(s): FE, PSAT, FERR   No results found for: RBCF   No results for input(s): PH, PCO2, PO2 in the last 72 hours.  No results for input(s): CPK in the last 72  hours.    Invalid input(s): CPKMB, CKNDX, TROIQ  Lab Results   Component Value Date/Time    CHOL 134 09/13/2022 05:45 AM    HDL 54 09/13/2022 05:45 AM    LDL 69.4 09/13/2022 05:45 AM     No results found for: GLUCPOC  @LABUA @      Medications Reviewed:     Current Facility-Administered Medications   Medication Dose Route Frequency    ascorbic acid  (VITAMIN C ) tablet 500 mg  500 mg Oral Daily    donepezil  (ARICEPT ) tablet 10 mg  10 mg Oral Daily before dinner    DULoxetine  (CYMBALTA ) extended release capsule 30 mg  30 mg Oral Daily before dinner    pregabalin  (LYRICA ) capsule 200 mg  200 mg Oral BID    rOPINIRole  (REQUIP ) tablet 0.25 mg  0.25 mg Oral Nightly    valsartan  (DIOVAN ) tablet 160 mg  160 mg Oral Daily    vitamin E  capsule 1,000 Units  1,000 Units Oral Daily    sodium chloride  flush 0.9 % injection 5-40 mL  5-40 mL IntraVENous 2 times per day    sodium chloride  flush 0.9 % injection 5-40 mL  5-40 mL IntraVENous PRN    0.9 % sodium chloride  infusion   IntraVENous PRN    potassium chloride  (KLOR-CON ) extended release tablet 40 mEq  40 mEq Oral PRN    Or    potassium bicarb-citric acid  (EFFER-K) effervescent tablet 40 mEq  40 mEq Oral PRN    Or    potassium chloride  10 mEq/100 mL IVPB (Peripheral Line)  10 mEq IntraVENous PRN    magnesium  sulfate 2000 mg in 50 mL IVPB premix  2,000 mg IntraVENous PRN    enoxaparin  (LOVENOX ) injection 40 mg  40 mg SubCUTAneous Daily    ondansetron  (ZOFRAN -ODT) disintegrating tablet 4 mg  4 mg Oral Q8H PRN    Or    ondansetron  (ZOFRAN ) injection 4 mg  4 mg IntraVENous Q6H PRN    polyethylene glycol (GLYCOLAX ) packet 17 g  17 g Oral Daily PRN    acetaminophen  (TYLENOL ) tablet 650 mg  650 mg Oral Q6H PRN    Or    acetaminophen  (TYLENOL ) suppository 650 mg  650 mg Rectal Q6H PRN    oxyCODONE  (ROXICODONE ) immediate release tablet 2.5 mg  2.5 mg Oral Q6H PRN    morphine  (PF) injection 1 mg  1 mg IntraVENous Q4H PRN    ipratropium 0.5 mg-albuterol  2.5 mg (DUONEB ) nebulizer  solution 1 Dose  1 Dose Inhalation Q4H PRN    ipratropium 0.5 mg-albuterol  2.5 mg (DUONEB ) nebulizer solution 1 Dose  1 Dose Inhalation 4x Daily RT     ______________________________________________________________________  EXPECTED LENGTH OF STAY: 3  ACTUAL LENGTH OF  STAY:          3                 Kathrine Pac, MD     "

## 2024-06-08 NOTE — Telephone Encounter (Signed)
"  Appt scheduled for 06/12/24  "

## 2024-06-08 NOTE — Care Coordination (Signed)
 "Ambulatory Care Coordination Note     06/08/2024 9:27 AM     Patient Current Location:  [place]      ACM contacted the caregiver by telephone. Verified name and DOB with caregiver as identifiers.         ACM: Tilton CHRISTELLA Bailey, RN     Challenges to be reviewed by the provider   Additional needs identified to be addressed with provider Yes  home health care- [facility]  medications- Caregiver has questions about some medications  PT-    OT-    Would like Vertical appointment, Please call- hospital follow up             Method of communication with provider: chart routing.    Utilization: Has the patient been discharged from the hospital since your last call? yes -   Call within 2 business days of discharge: Yes, spoke with patient or caregiver.    Patient: Kimberly [name]    Patient DOB: 1924/02/06   MRN: [id number]    Reason for Admission: Acute on chronic respiratory failure with hypoxia   Discharge Date: 06/07/24  RURS: Readmission Risk Score: 19.4      Last Discharge Facility       Date Complaint Diagnosis Description Type Department Provider    06/04/24 Wheezing Exertional dyspnea ... ED to Hosp-Admission (Discharged) (ADMITTED) [id number] Fairy Catching, MD; Jenell Gull...            Was this an external facility discharge? No    Ambulatory Care Manager reviewed discharge instructions, medical action plan, and red flags with caregiver. The caregiver was given an opportunity to ask questions; questions regarding Medications sent to provider for clarification.. The caregiver verbalized understanding.   Were discharge instructions available to patient? Yes.   Reviewed appropriate site of care based on symptoms and resources available to patient including: PCP  Specialist  After hours contact [phone]  Urgent care clinics  Home health  When to call 911  MyChart Messaging. The caregiver agrees to contact the primary care provider and/or specialist office for questions related to their healthcare.     Patients  top risk factors for readmission: depression, falls, and medical condition-Dementia    Hospital follow up appointment: Patient does not have a follow up appointment scheduled at time of call. Prefers to self-schedule TOC appointment. Caregiver wanted message left with PCP for virtual visit to review medication concerns, do not want patient to have to go out to office. ACM called to have message sent to PCP and ACM sent message to PCP and nurse.     Care Summary Note: Spoke with caregiver, Kimberly [name], who says patient is home and for now will be receiving home PT/Ot. Will decide on Palliative later. She will schedule a virtual visit with Dr. Terrea. She do not feel patient needs to be seen in office since she just got out of hospital. She has concerns about medications and would like a virtual follow up. Message sent to office by ACM and call to office by ACM to schedule and was told a message was being sent to office to return call to caregiver. Metrics at goal. Will start PT/OT with [facility]. Has all ordered medications and is taking. Caregiver concern about vitamins and pain medication that are new orders. Nurse introductory and Walk in Directory sent by My Chart at earlier admission.    Assessments Completed:       06/08/2024     9:03 AM   Amb [place]  Risk Assessment and TUG Test   Do you feel unsteady or are you worried about falling?  yes   2 or more falls in past year? yes   Fall with injury in past year? yes    ,   Ambulatory Care Coordination Assessment    Care Coordination Protocol  Referral from Primary Care Provider: No  Week 1 - Initial Assessment     Do you have all of your prescriptions and are they filled?: Yes  Barriers to medication adherence: None     Do you have Home O2 Therapy?: Yes   Oxygen Regimen: Continuous Flow - Enter rate/FIO2: 2   Method of Delivery: Nasal Cannula   CPAP Use: None      Ability to seek help/take action for Emergent Urgent situations i.e. fire, crime, inclement weather or  health crisis.: Dependent  Ability to ambulate to restroom: Independent  Ability handle personal hygeine needs (bathing/dressing/grooming): Needs Assistance  Ability to manage Medications: Dependent  Ability to prepare Food Preparation: Dependent  Ability to maintain home (clean home, laundry): Dependent  Ability to drive and/or has transportation: Dependent  Ability to do shopping: Dependent  Ability to manage finances: Dependent  Is patient able to live independently?: No     Current Housing: Private Residence  Who do you live with?: Other Caregiver (Comment: 09-17-22: Patient's niece, Kimberly [name] Shaver, resides with the patient.)  Are you an active caregiver in your home?: No     Do you have any DME?: No  Patient Home Equipment: Oxygen, Nebulizer, Other     Per the Fall Risk Screening, did the patient have 2 or more falls or 1 fall with injury in the past year?: Yes  How often do you think you are about to fall and you do NOT fall? For example, you grab something to stabilize yourself or hold onto a wall/furniture?: Rarely  Use of a Mobility Aid: No  Difficulty walking/impaired gait: No  Issues with feet or shoes like numbness, edema, shoes not fitting: No  Changes in vision, poor vision or poor lighting in environment: No  Dizziness: No  Other Fall Risk: Yes  What other fall risks does the patient have?: problem hearing     Frequent urination at night?: Yes  Do you use rails/bars?: No  Do you have a non-slip tub mat?: Yes     Are you experiencing loss of meaning?: No  Are you experiencing loss of hope and peace?: No     Thinking about your patient's physical health needs, are there any symptoms or problems (risk indicators) you are unsure about that require further investigation?: Moderate to severe symptoms or problems that impact on daily life   Are the patients physical health problems impacting on their mental well-being?: Moderate to severe impact upon mental well-being and preventing enjoyment of usual  activities   Are there any problems with your patients lifestyle behaviors (alcohol, drugs, diet, exercise) that are impacting on physical or mental well-being?: Some mild concern of potential negative impact on well-being   Do you have any other concerns about your patients mental well-being? How would you rate their severity and impact on the patient?: Moderate to severe problems that interfere with function   How would you rate their home environment in terms of safety and stability (including domestic violence, insecure housing, neighbor harassment)?: Safety/stability questionable   How do daily activities impact on the patient's well-being? (include current or anticipated unemployment, work, caregiving, access to transportation or other): Some  general dissatisfaction but no concern   How would you rate their social network (family, work, friends)?: Adequate participation with social networks   How would you rate their financial resources (including ability to afford all required medical care)?: Financially secure, some resource challenges   How wells does the patient now understand their health and well-being (symptoms, signs or risk factors) and what they need to do to manage their health?: Little understanding which impacts on their ability to undertake better management   How well do you think your patient can engage in healthcare discussions? (Barriers include language, deafness, aphasia, alcohol or drug problems, learning difficulties, concentration): Some difficulties in communication with or without moderate barriers   Do other services need to be involved to help this patient?: Other care/services in place and adequate   Are current services involved with this patient well-coordinated? (Include coordination with other services you are now recommendation): Required care/services in place and adequately coordinated   Suggested Interventions and Community Resources  Behavioral Health: Declined      Fall  Risk Prevention: In Process Disease Specific Clinic: In Process   Disease Assocation: In Process   Physical Therapy: In Process   Specialty Service Referral: In Process   Other Therapy Services: In Process   Other Services: In Process   Zone Management Tools: In Process         Other Interventions: Set up/Review Goals, Set up/Review an Education Plan, Schedule an appointment with the patient's PCP           ,   Care Coordination Interventions    Referral from Primary Care Provider: No  Suggested Interventions and Community Resources  Behavorial Health: Declined  Fall Risk Prevention: In Process  Disease Specific Clinic: In Process  Disease Association: In Process  Physical Therapy: In Process  Specialty Services Referral: In Process  Other Therapy Services: In Process  Other Services: In Process  Zone Management Tools: In Process      ,   Hypertension - Encounter Level    Symptom course: stable      ,   General Assessment    Do you have any symptoms that are causing concern?: No          Medications Reviewed:   Completed during this call and 1111F entered: yes    Advance Care Planning:   Reviewed and current     Care Planning:   Education Documentation  Discuss photos and familiar objects from home, taught by Arnaldo Tilton HERO, RN at 06/08/2024  9:17 AM.  Learner: Caregiver  Readiness: Acceptance  Method: Explanation  Response: Verbalizes Understanding    Discuss aids to assist in orientation, taught by Arnaldo Tilton HERO, RN at 06/08/2024  9:17 AM.  Learner: Caregiver  Readiness: Acceptance  Method: Explanation  Response: Verbalizes Understanding    Educate transfer safety, taught by Arnaldo Tilton HERO, RN at 06/08/2024  9:17 AM.  Learner: Caregiver  Readiness: Acceptance  Method: Explanation  Response: Verbalizes Understanding    Provide High Risk Information for Falls Program, taught by Arnaldo Tilton HERO, RN at 06/08/2024  9:17 AM.  Learner: Caregiver  Readiness: Acceptance  Method: Explanation  Response:  Verbalizes Understanding    Educate medication side effects, taught by Arnaldo Tilton HERO, RN at 06/08/2024  9:17 AM.  Learner: Caregiver  Readiness: Acceptance  Method: Explanation  Response: Verbalizes Understanding    Educate safety precautions, taught by Arnaldo Tilton HERO, RN at 06/08/2024  9:17 AM.  Learner: Caregiver  Readiness:  Acceptance  Method: Explanation  Response: Verbalizes Understanding    Educate home safety, taught by Arnaldo Tilton HERO, RN at 06/08/2024  9:17 AM.  Learner: Caregiver  Readiness: Acceptance  Method: Explanation  Response: [place] Understanding    COGNITIVE : FALLS-RISK OF, taught by Arnaldo Tilton HERO, RN at 06/08/2024  9:17 AM.  Learner: Caregiver  Readiness: Acceptance  Method: Explanation  Response: Verbalizes Understanding    COGNITIVE : FALLS-RISK OF, taught by Arnaldo Tilton HERO, RN at 06/08/2024  9:17 AM.  Learner: Caregiver  Readiness: Acceptance  Method: Explanation  Response: Verbalizes Understanding    Educate bathing safety, taught by Arnaldo Tilton HERO, RN at 06/08/2024  9:17 AM.  Learner: Caregiver  Readiness: Acceptance  Method: Explanation  Response: Verbalizes Understanding    Educate ambulation safety, taught by Arnaldo Tilton HERO, RN at 06/08/2024  9:17 AM.  Learner: Caregiver  Readiness: Acceptance  Method: Explanation  Response: Verbalizes Understanding    Influenza Vaccine, taught by Arnaldo Tilton HERO, RN at 06/08/2024  9:17 AM.  Learner: Caregiver  Readiness: Acceptance  Method: Explanation  Response: Verbalizes Understanding    Pneumovax Recommendation, taught by Arnaldo Tilton HERO, RN at 06/08/2024  9:17 AM.  Learner: Caregiver  Readiness: Acceptance  Method: Explanation  Response: Verbalizes Understanding    Lifestyle Changes/Goal Setting, taught by Arnaldo Tilton HERO, RN at 06/08/2024  9:17 AM.  Learner: Caregiver  Readiness: Acceptance  Method: Explanation  Response: Verbalizes Understanding    General medication information, taught by Arnaldo Tilton HERO, RN at  06/08/2024  9:17 AM.  Learner: Caregiver  Readiness: Acceptance  Method: Explanation  Response: [facility], taught by Arnaldo Tilton HERO, RN at 06/08/2024  9:17 AM.  Learner: Caregiver  Readiness: Acceptance  Method: Explanation  Response: Verbalizes Understanding    Educate reporting changes in condition, taught by Arnaldo Tilton HERO, RN at 06/08/2024  9:17 AM.  Learner: Caregiver  Readiness: Acceptance  Method: Explanation  Response: Verbalizes Understanding    Educate Patient on When to Call for Symptoms, taught by Arnaldo Tilton HERO, RN at 06/08/2024  9:17 AM.  Learner: Caregiver  Readiness: Acceptance  Method: Explanation  Response: Verbalizes Understanding    Educate limitations or barriers to activity and/or exercise on discharge, taught by Arnaldo Tilton HERO, RN at 06/08/2024  9:17 AM.  Learner: Caregiver  Readiness: Acceptance  Method: Explanation  Response: Verbalizes Understanding    COGNITIVE : DISCHARGE PLANNING, taught by Arnaldo Tilton HERO, RN at 06/08/2024  9:17 AM.  Learner: Caregiver  Readiness: Acceptance  Method: Explanation  Response: Verbalizes Understanding    Educate dietary adherence benefits, taught by Arnaldo Tilton HERO, RN at 06/08/2024  9:17 AM.  Learner: Caregiver  Readiness: Acceptance  Method: Explanation  Response: Verbalizes Understanding    Educate caregiver effective coping behavior, taught by Arnaldo Tilton HERO, RN at 06/08/2024  9:17 AM.  Learner: Caregiver  Readiness: Acceptance  Method: Explanation  Response: Verbalizes Understanding    Adaptive Equipment, taught by Arnaldo Tilton HERO, RN at 06/08/2024  9:17 AM.  Learner: Caregiver  Readiness: Acceptance  Method: Explanation  Response: Verbalizes Understanding    Education Comments  No comments found.     ,    Goals Addressed                   This Visit's Progress     Conditions and Symptoms   No change     I will schedule office visits, as directed by  my provider.  I will keep my appointment  or reschedule if I have to cancel.  I will notify my provider of any barriers to my plan of care.  I will follow my Zone Management tool to seek urgent or emergent care.  I will notify my provider of any symptoms that indicate a worsening of my condition.    Barriers: impairment:  physical:  and cognitive  Plan for overcoming my barriers: PT/OT, compliance  Confidence: 8/10  Anticipated Goal Completion Date: 05/16/24.    03/13/24  Completed home PT.  Uses a Rolator walker when needed.  Recently saw neuro (Dr. Claudene) for evaluation.  Completed PCP follow up on today    04/01/24  No new falls  Rolator  to assist with mobility  Will keep all scheduled appointments  Will notify provider if changes occur    04/14/24  Completed follow up as was scheduled    05/21/24  Hospital discharge on 05/20/24 for acute hypoxic respiratory failure  Home with PT/OT  Has all ordered medications  Called office to schedule appointment, will call back  Remain on O2 at 2 L at home  Continue to take all ordered medications  Admits to doing same  No recent falls, safety precautions reviewed    06/04/2024  Ed visit on 06/04/24 COPD    06/08/24  ACM called to schedule follow up from hospital  Office will call back  Home with Niece, Kimberly [name]=caregiver  [facility] to start PT/OT               PCP/Specialist follow up:   Future Appointments         Provider Specialty Dept Phone    07/27/2024 2:00 PM Kennie Darin MATSU, MD Family Medicine [phone]    03/31/2025 2:00 PM Claudene Debby LABOR, MD Neurology [phone]            Follow Up:   Plan for next ACM outreach in approximately 1 week to complete:  - CC Protocol assessments  - disease specific assessments  - goal progression  - education .   Patient  is agreeable to this plan.        "

## 2024-06-08 NOTE — Telephone Encounter (Signed)
"  Appt scheduled for 06/12/24@12p   "

## 2024-06-08 NOTE — Telephone Encounter (Signed)
"-----   Message from Your Healthcare Team sent at 06/08/2024  9:06 AM EST -----  Regarding: ECC Appointment Request  ECC Appointment Request    Patient needs appointment for [facility] Appointment Type: Hospital Follow Up.Virtual Appointment    Patient Requested Dates(s):Within 3 days   Patient Requested Time:Anytime  Provider Name:Enowtaku, Andre G, MD    Reason for Appointment Request: Other Unable to reach the office.   --------------------------------------------------------------------------------------------------------------------------    Relationship to Patient: Covered Entity Derrisha- case manager     Call Back Information: OK to leave message on voicemail  Preferred Call Back Number: Phone [phone]  "

## 2024-06-11 NOTE — Telephone Encounter (Signed)
"  Lendia with [facility] said that she is returning a call from [place], she can be reached at [phone].   "

## 2024-06-11 NOTE — Telephone Encounter (Signed)
"  Informed that a phone call will be returned by Nurse on duty. Patient has appt tomorrow and referral can be generated at that appt.  "

## 2024-06-11 NOTE — Telephone Encounter (Signed)
"  Janann mosses [facility] needs order for Hospice evaluation and treatment. Send orders and office notes, med list , insurance information faxed to [phone].  "

## 2024-06-12 ENCOUNTER — Telehealth: Admit: 2024-06-12 | Discharge: 2024-06-12 | Payer: MEDICARE | Attending: Family Medicine | Primary: Family Medicine

## 2024-06-12 DIAGNOSIS — Z09 Encounter for follow-up examination after completed treatment for conditions other than malignant neoplasm: Principal | ICD-10-CM

## 2024-06-12 NOTE — Assessment & Plan Note (Signed)
"  Likely cause of chronic hypoxic respiratory failure.  Recommend continuous oxygen use  "

## 2024-06-12 NOTE — Progress Notes (Signed)
 " Post-Discharge Transitional Care  Follow Up      Kimberly [name]   Date of Birth: 24-May-1924    Date of Office Visit: 06/12/2024  Date of Hospital Admission: 06/04/24  Date of Hospital Discharge: 06/07/24  Risk of hospital readmission (high >=14%. Medium >=10%): Readmission Risk Score: 19.4      Care management risk score Rising risk (score 2-5) and Complex Care (Scores >=6): No Risk Score On File     Non face to face following discharge, date last encounter closed (first attempt may have been earlier): 06/08/2024    Call initiated 2 business days of discharge: Yes, completed    ASSESSMENT/PLAN:   Hospital discharge follow-up  -     PR DISCHARGE MEDS RECONCILED W/ CURRENT OUTPATIENT MED LIST  Acute hypoxemic respiratory failure (HCC)  Assessment & Plan:  This acute on chronic problem. Recommend that she stays on oxygen to maintain oxygen saturations above.  Recommend discussing with hospice for evaluation  Pulmonary fibrosis [facility])  Assessment & Plan:  Likely cause of chronic hypoxic respiratory failure.  Recommend continuous oxygen use         SUBJECTIVE:   HPI: Follow up of Hospital problems/diagnosis(es): Acute on chronic respiratory failure with hypoxia     Inpatient course: Discharge summary reviewed- see chart.    Interval history/Current status: Caregiver states that patient is at her baseline. He oxygen saturation will drop in the low 90s in the absence of oxygen via NC.     Other chronic medical problems:    Chronic DOE.     Patient was unable to complete the PFTs.  She does have chronic respiratory failure likely from the pulmonary fibrosis.  Requires     Chronic back pain:  Uses a walker and also benefits from the hospital bed    Neuropathy/dementia:  On Lyrica  200mg  BID for neuropathy managed by neurology.  Completed home PT.  Uses a Rolator walker when needed.     Restless leg - on Ropinirole .        Other Health Habits and social history:  Her only daughter passed away.     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    Patient Active Problem List   Diagnosis    SOB (shortness of breath) on exertion    Malignant neoplasm of upper-outer quadrant of left breast in female, estrogen receptor negative (HCC)    Primary insomnia    Memory loss    Acute bacterial conjunctivitis of right eye    Syncope and collapse    Primary hypertension    Disturbance of memory    High cholesterol    History of surgical removal of pituitary gland    Tension vascular headache    Headache    Bilateral carotid artery stenosis    Pituitary macroadenoma with extrasellar extension (HCC)    Headache disorder    Cerebral microvascular disease    Acute alteration in mental status    Chest pain with normal angiography    PVD (peripheral vascular disease)    Degenerative cervical spinal stenosis    Idiopathic small fiber peripheral neuropathy    Vitamin D deficiency    B12 deficiency    Immune-mediated neuropathy    Lumbosacral radiculopathy due to degenerative joint disease of spine    Cervical radiculopathy due to degenerative joint disease of spine    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)    Numbness    Stroke-like symptoms    Acute cystitis without hematuria    Chronic pain of right ankle    Dementia with behavioral disturbance (HCC)    Acute bronchitis    Chronic bilateral low back pain    History of stroke    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 If you have any questions, ask your nurse or doctor.                CONTINUE taking these medications      acetaminophen -codeine  300-30 MG per tablet  Commonly known as: TYLENOL  #3     albuterol  (2.5 MG/3ML) 0.083% nebulizer solution  Commonly known as: PROVENTIL   Take 3 mLs by nebulization 4 times daily as needed for Wheezing     ammonium lactate 12 % lotion  Commonly known as: LAC-HYDRIN     ascorbic acid  100 MG tablet  Commonly known as: VITAMIN C      cetirizine  5 MG tablet  Commonly known as: ZYRTEC   Take 1 tablet by mouth daily     chlorhexidine 0.12 % solution  Commonly known as: PERIDEX     docusate sodium  100 MG capsule  Commonly known as: Colace  Take 1 capsule by mouth daily     donepezil  10 MG tablet  Commonly known as: Aricept   Take 1 tablet by mouth daily (before dinner)     * DULoxetine  60 MG extended release capsule  Commonly known as: CYMBALTA   Take 1 capsule by mouth daily     * DULoxetine  30 MG extended release capsule  Commonly known as: Cymbalta   Take 1 capsule by mouth daily (before dinner)     ferrous sulfate  220 (44 Fe) MG/5ML Soln  Take 220 mg by mouth daily     fish oil 1000 MG Cpdr     fluticasone 50 MCG/ACT nasal spray  Commonly known as: FLONASE     folic acid  1 MG tablet  Commonly known as: FOLVITE   Take 1 tablet by mouth daily     multivitamin with minerals tablet  Take 1 tablet by mouth daily     pregabalin  200 MG capsule  Commonly known as: LYRICA   TAKE 1 CAPSULE BY MOUTH TWICE DAILY. MAX DAILY AMOUNT: 400 MG     rOPINIRole  0.25 MG tablet  Commonly known as: REQUIP   Take 1 tablet by mouth nightly     valsartan  160 MG tablet  Commonly known as: DIOVAN   Take 1 tablet by mouth daily     vitamin D 50 MCG (2000 UT) Caps capsule  Commonly known as: CHOLECALCIFEROL     vitamin E  1000 units capsule     Zinc 30 MG Caps           * This list has 2 medication(s) that are the same as  other medications prescribed for  you. Read the directions carefully, and ask your doctor or other care provider to review them with you.                    Medications marked taking at this time  Outpatient Medications Marked as Taking for the 06/12/24 encounter (Telemedicine) with Kennie Darin MATSU, MD   Medication Sig Dispense Refill    Multiple Vitamins-Minerals (MULTIVITAMIN WITH MINERALS) tablet Take 1 tablet by mouth daily 30 tablet 3    ferrous sulfate  220 (44 Fe) MG/5ML SOLN Take 220 mg by mouth daily 5 mL 0    folic acid  (FOLVITE ) 1 MG tablet Take 1 tablet by mouth daily 90 tablet 1    acetaminophen -codeine  (TYLENOL  #3) 300-30 MG per tablet Take 1 tablet by mouth every 6 hours as needed for Pain.      fluticasone (FLONASE) 50 MCG/ACT nasal spray 1 spray by Nasal route daily as needed for Rhinitis or Allergies      pregabalin  (LYRICA ) 200 MG capsule TAKE 1 CAPSULE BY MOUTH TWICE DAILY. MAX DAILY AMOUNT: 400 MG 180 capsule 1    valsartan  (DIOVAN ) 160 MG tablet Take 1 tablet by mouth daily 90 tablet 3    donepezil  (ARICEPT ) 10 MG tablet Take 1 tablet by mouth daily (before dinner) 30 tablet 11    DULoxetine  (CYMBALTA ) 30 MG extended release capsule Take 1 capsule by mouth daily (before dinner) 30 capsule 11    docusate sodium  (COLACE) 100 MG capsule Take 1 capsule by mouth daily 30 capsule 5    DULoxetine  (CYMBALTA ) 60 MG extended release capsule Take 1 capsule by mouth daily 90 capsule 1    albuterol  (PROVENTIL ) (2.5 MG/3ML) 0.083% nebulizer solution Take 3 mLs by nebulization 4 times daily as needed for Wheezing 120 each 3    cetirizine  (ZYRTEC ) 5 MG tablet Take 1 tablet by mouth daily 90 tablet 2    rOPINIRole  (REQUIP ) 0.25 MG tablet Take 1 tablet by mouth nightly 90 tablet 1    Zinc 30 MG CAPS Take by mouth      Omega-3 Fatty Acids (FISH OIL) 1000 MG CPDR Take 3 capsules by mouth      chlorhexidine (PERIDEX) 0.12 % solution Swish and spit 15 mLs 2 times daily      vitamin E  1000 units capsule Take 1 capsule by mouth daily       Cholecalciferol (VITAMIN D3) 50 MCG (2000 UT) CAPS Take 1 capsule by mouth daily      ammonium lactate (LAC-HYDRIN) 12 % lotion Apply 1 Application topically as needed in the morning and 1 Application as needed in the evening.      ascorbic acid  (VITAMIN C ) 100 MG tablet Take 1 tablet by mouth daily          Medications patient taking as of now reconciled against medications ordered at time of hospital discharge: Yes    A comprehensive review of systems was negative except for what was noted in the HPI.    OBJECTIVE:    Patient-Reported Vitals  No data recorded      General Appearance: alert and oriented to person, place and time, well-developed and well-nourished, in no acute distress  Pulmonary/Chest: unlabored    Kimberly [name], was evaluated through a synchronous (real-time) audio-video encounter. The patient (or guardian if applicable) is aware that this is a billable service, which includes applicable co-pays. This Virtual Visit was conducted with patient's (and/or legal guardian's) consent.  Patient identification was verified, and a caregiver was present when appropriate.   The patient was located at Home: [address]  [place] [address]  Provider was located at [facility] (Appt Dept): [address] [place],  [place] [address]  Confirm you are appropriately licensed, registered, or certified to deliver care in the state where the patient is located as indicated above. If you are not or unsure, please re-schedule the visit: Yes, I confirm.     An electronic signature was used to authenticate this note.  --Darin Inetta Bumps, MD        "

## 2024-06-12 NOTE — Assessment & Plan Note (Addendum)
"  This acute on chronic problem. Recommend that she stays on oxygen to maintain oxygen saturations above.  Recommend discussing with hospice for evaluation  "

## 2024-06-12 NOTE — Progress Notes (Signed)
"  Chief Complaint   Patient presents with    Follow-Up from [facility]     06/04/2024 - 06/07/2024 (3 days)  [facility]  Pulmonary fibrosis       Order request      [facility] needs order for Hospice evaluation and treatment     Have you been to the ER, urgent care clinic since your last visit?  Hospitalized since your last visit?      Yes  06/04/2024 - 06/07/2024 (3 days)  [facility]  Pulmonary fibrosis     Have you seen or consulted any other health care providers outside of [facility] since your last visit?    NO      Health Maintenance Due   Topic Date Due    Shingles vaccine (2 of 3) 02/01/2015    Pneumococcal 50+ years Vaccine (2 of 2 - PCV) 08/19/2015    Annual Wellness Visit (Medicare)  01/16/2024    Flu vaccine (1) 03/06/2024    COVID-19 Vaccine (8 - 2024-25 season) 04/06/2024      The patient, Kimberly [name], identity was verified by name and DOB, pharmacy verified         "

## 2024-06-15 ENCOUNTER — Encounter

## 2024-06-15 NOTE — Telephone Encounter (Signed)
"  Lorenza is still waiting for these and said that the pt has an appointment scheduled tomorrow, please fax these to [phone]. She can be reached at [phone].   "

## 2024-06-17 MED ORDER — ROPINIROLE HCL 0.25 MG PO TABS
0.25 | ORAL_TABLET | Freq: Every evening | ORAL | 1 refills | 90.00000 days | Status: AC
Start: 2024-06-17 — End: ?

## 2024-06-17 NOTE — Telephone Encounter (Signed)
"  Last appointment: 06/12/24  Next appointment: 07/27/24  Previous refill encounter(s): 09/17/23 #90 with 1 refill    Requested Prescriptions     Pending Prescriptions Disp Refills    rOPINIRole  (REQUIP ) 0.25 MG tablet [Pharmacy Med Name: ROPINIROLE  0.25MG  TABLETS] 90 tablet 1     Sig: TAKE 1 TABLET BY MOUTH EVERY NIGHT         For Pharmacy Admin Tracking Only    Program: Medication Refill  CPA in place:    Recommendation Provided To:   Intervention Detail: New Rx: 1, reason: Patient Preference  Intervention Accepted By:   Joesph Closed?:    Time Spent (min): 5  "

## 2024-06-19 NOTE — Telephone Encounter (Signed)
"  Faxed Order [phone] to [facility] [phone]  "

## 2024-06-24 NOTE — Telephone Encounter (Signed)
"  Tillman with [facility] states that she would like to get the orders back she can be reached @ [phone]  "

## 2024-06-24 NOTE — Telephone Encounter (Signed)
"  [facility] orders faxed to day.  "

## 2024-06-29 NOTE — Care Coordination (Signed)
"  Ambulatory Care Management Note    Date/Time:  06/29/2024   Patient Current Location: Kimberly [name]      Patient has graduated from the Ambulatory Care Management program on 06/29/2024.  Patient/family has the ability to self-manage at this time.  Care management goals have been completed. No further Ambulatory Care Manager follow up scheduled.     Goals Addressed                   This Visit's Progress     Conditions and Symptoms   No change     I will schedule office visits, as directed by my provider.  I will keep my appointment or reschedule if I have to cancel.  I will notify my provider of any barriers to my plan of care.  I will follow my Zone Management tool to seek urgent or emergent care.  I will notify my provider of any symptoms that indicate a worsening of my condition.    Barriers: impairment:  physical:  and cognitive  Plan for overcoming my barriers: PT/OT, compliance  Confidence: 8/10  Anticipated Goal Completion Date: 05/16/24.    03/13/24  Completed home PT.  Uses a Rolator walker when needed.  Recently saw neuro (Dr. Claudene) for evaluation.  Completed PCP follow up on today    04/01/24  No new falls  Rolator  to assist with mobility  Will keep all scheduled appointments  Will notify provider if changes occur    04/14/24  Completed follow up as was scheduled    05/21/24  Hospital discharge on 05/20/24 for acute hypoxic respiratory failure  Home with PT/OT  Has all ordered medications  Called office to schedule appointment, will call back  Remain on O2 at 2 L at home  Continue to take all ordered medications  Admits to doing same  No recent falls, safety precautions reviewed    06/04/2024  Ed visit on 06/04/24 COPD    06/08/24  ACM called to schedule follow up from hospital  Office will call back  Home with Niece, Regina=caregiver  [facility] to start PT/OT    06/29/24  Patient still i on Hospice  eval, according to niece  Continued dyspnea on exertion  Using O2 at home  Chronic back pain  According to niece need  frequent redirection, safety precautions reviewed  Patient at baseline according to niece  Working with [facility] for mobility  Niece waiting to hear if qualify for Hospice.   Episode resolved, ACM contact given.                  Patient has Ambulatory Care Manager's contact information for any further questions, concerns, or needs.  Patients upcoming visits:    Future Appointments   Date Time Provider Department Center   07/27/2024  2:00 PM Kennie Darin MATSU, MD [facility] ECC DEP   03/31/2025  2:00 PM Claudene Debby LABOR, MD [id number] AMB       "

## 2024-07-01 NOTE — Progress Notes (Signed)
"  Faxed to : [facility] order #[id number] [phone]  "

## 2024-07-09 NOTE — Telephone Encounter (Signed)
"  Kimberly [name] with [facility] wanted to follow up on a fax that was sent over on 11/26. She can be reached at [phone].   "

## 2024-07-27 ENCOUNTER — Ambulatory Visit: Payer: MEDICARE | Attending: Family Medicine | Primary: Family Medicine
